# Patient Record
Sex: Male | Born: 1935 | Race: White | Hispanic: No | Marital: Married | Smoking: Former smoker
Health system: Southern US, Community
[De-identification: ages and names within clinical notes are randomized; demographics above are authoritative.]

## PROBLEM LIST (undated history)

## (undated) DIAGNOSIS — I2510 Atherosclerotic heart disease of native coronary artery without angina pectoris: Secondary | ICD-10-CM

## (undated) DIAGNOSIS — E119 Type 2 diabetes mellitus without complications: Secondary | ICD-10-CM

## (undated) DIAGNOSIS — H9190 Unspecified hearing loss, unspecified ear: Secondary | ICD-10-CM

## (undated) DIAGNOSIS — I10 Essential (primary) hypertension: Secondary | ICD-10-CM

## (undated) DIAGNOSIS — E785 Hyperlipidemia, unspecified: Secondary | ICD-10-CM

## (undated) DIAGNOSIS — H3530 Unspecified macular degeneration: Secondary | ICD-10-CM

## (undated) DIAGNOSIS — N289 Disorder of kidney and ureter, unspecified: Secondary | ICD-10-CM

## (undated) DIAGNOSIS — I219 Acute myocardial infarction, unspecified: Secondary | ICD-10-CM

## (undated) DIAGNOSIS — M1990 Unspecified osteoarthritis, unspecified site: Secondary | ICD-10-CM

## (undated) DIAGNOSIS — D689 Coagulation defect, unspecified: Secondary | ICD-10-CM

## (undated) DIAGNOSIS — I639 Cerebral infarction, unspecified: Secondary | ICD-10-CM

## (undated) HISTORY — DX: Type 2 diabetes mellitus without complications: E11.9

## (undated) HISTORY — PX: CORONARY ANGIOPLASTY WITH STENT PLACEMENT: SHX49

## (undated) HISTORY — DX: Disorder of kidney and ureter, unspecified: N28.9

## (undated) HISTORY — PX: THYROIDECTOMY: SHX17

## (undated) HISTORY — DX: Atherosclerotic heart disease of native coronary artery without angina pectoris: I25.10

## (undated) HISTORY — DX: Acute myocardial infarction, unspecified: I21.9

## (undated) HISTORY — DX: Coagulation defect, unspecified: D68.9

## (undated) HISTORY — DX: Unspecified osteoarthritis, unspecified site: M19.90

## (undated) HISTORY — DX: Essential (primary) hypertension: I10

## (undated) HISTORY — DX: Unspecified macular degeneration: H35.30

## (undated) HISTORY — DX: Cerebral infarction, unspecified: I63.9

## (undated) HISTORY — DX: Unspecified hearing loss, unspecified ear: H91.90

## (undated) HISTORY — DX: Hyperlipidemia, unspecified: E78.5

## (undated) HISTORY — PX: REPLACEMENT TOTAL KNEE: SUR1224

---

## 2019-08-08 LAB — PROTIME-INR: INR: 2.1 — AB (ref 0.9–1.1)

## 2019-09-24 LAB — COMPREHENSIVE METABOLIC PANEL
Calcium: 9.2 (ref 8.7–10.7)
GFR calc Af Amer: 96.4
GFR calc non Af Amer: 79.6

## 2019-09-24 LAB — BASIC METABOLIC PANEL
BUN: 17 (ref 4–21)
CO2: 30 — AB (ref 13–22)
Chloride: 110 — AB (ref 99–108)
Creatinine: 0.9 (ref 0.6–1.3)
Glucose: 110
Potassium: 4.6 (ref 3.4–5.3)
Sodium: 146 (ref 137–147)

## 2019-10-15 LAB — BASIC METABOLIC PANEL
BUN: 29 — AB (ref 4–21)
CO2: 28 — AB (ref 13–22)
Chloride: 108 (ref 99–108)
Creatinine: 1 (ref 0.6–1.3)
Glucose: 78
Potassium: 5.1 (ref 3.4–5.3)
Sodium: 141 (ref 137–147)

## 2019-10-15 LAB — COMPREHENSIVE METABOLIC PANEL
Calcium: 10 (ref 8.7–10.7)
GFR calc Af Amer: 85.3
GFR calc non Af Amer: 70.5

## 2019-10-25 LAB — BASIC METABOLIC PANEL
BUN: 35 — AB (ref 4–21)
CO2: 24 — AB (ref 13–22)
Chloride: 106 (ref 99–108)
Creatinine: 1.3 (ref 0.6–1.3)
Glucose: 150
Potassium: 5.7 — AB (ref 3.4–5.3)
Sodium: 138 (ref 137–147)

## 2019-10-25 LAB — COMPREHENSIVE METABOLIC PANEL
Calcium: 9.9 (ref 8.7–10.7)
GFR calc Af Amer: 63
GFR calc non Af Amer: 52.1

## 2019-10-25 LAB — TSH: TSH: 3.19 (ref 0.41–5.90)

## 2019-12-03 LAB — COMPREHENSIVE METABOLIC PANEL
Calcium: 9.8 (ref 8.7–10.7)
GFR calc Af Amer: 63
GFR calc non Af Amer: 52.1

## 2019-12-03 LAB — BASIC METABOLIC PANEL
BUN: 33 — AB (ref 4–21)
CO2: 26 — AB (ref 13–22)
Chloride: 109 — AB (ref 99–108)
Creatinine: 1.3 (ref 0.6–1.3)
Glucose: 84
Potassium: 5.5 — AB (ref 3.4–5.3)
Sodium: 142 (ref 137–147)

## 2019-12-03 LAB — HEMOGLOBIN A1C: Hemoglobin A1C: 7.7

## 2020-01-03 LAB — COMPREHENSIVE METABOLIC PANEL
Albumin: 3.3 — AB (ref 3.5–5.0)
Calcium: 8.9 (ref 8.7–10.7)
GFR calc Af Amer: 62
GFR calc non Af Amer: 53
Globulin: 4.2

## 2020-01-03 LAB — BASIC METABOLIC PANEL
BUN: 31 — AB (ref 4–21)
CO2: 24 — AB (ref 13–22)
Chloride: 111 — AB (ref 99–108)
Creatinine: 1.2 (ref 0.6–1.3)
Glucose: 109
Potassium: 4.6 (ref 3.4–5.3)
Sodium: 138 (ref 137–147)

## 2020-01-03 LAB — HEPATIC FUNCTION PANEL
ALT: 22 (ref 10–40)
AST: 18 (ref 14–40)
Alkaline Phosphatase: 60 (ref 25–125)
Bilirubin, Total: 0.2

## 2020-01-03 LAB — CBC: RBC: 3.5 — AB (ref 3.87–5.11)

## 2020-01-03 LAB — CBC AND DIFFERENTIAL
HCT: 34 — AB (ref 41–53)
Hemoglobin: 10.9 — AB (ref 13.5–17.5)
Platelets: 261 (ref 150–399)
WBC: 13.2

## 2020-02-27 ENCOUNTER — Ambulatory Visit: Payer: Self-pay | Admitting: Physician Assistant

## 2020-03-14 DIAGNOSIS — E109 Type 1 diabetes mellitus without complications: Secondary | ICD-10-CM | POA: Diagnosis not present

## 2020-03-14 DIAGNOSIS — H35033 Hypertensive retinopathy, bilateral: Secondary | ICD-10-CM | POA: Diagnosis not present

## 2020-03-14 DIAGNOSIS — H353231 Exudative age-related macular degeneration, bilateral, with active choroidal neovascularization: Secondary | ICD-10-CM | POA: Diagnosis not present

## 2020-03-14 DIAGNOSIS — H43813 Vitreous degeneration, bilateral: Secondary | ICD-10-CM | POA: Diagnosis not present

## 2020-03-31 ENCOUNTER — Telehealth: Payer: Self-pay

## 2020-03-31 NOTE — Telephone Encounter (Signed)
Pt.'s wife wants to know if pt can have his blood checked since he is on coumadin, before his new pt appointment.

## 2020-03-31 NOTE — Telephone Encounter (Signed)
Please advise 

## 2020-04-01 NOTE — Telephone Encounter (Signed)
Unfortunately we can't do labs on him before he is established.  Could check with an urgent care if needed or if his past provider can't do it.

## 2020-04-02 NOTE — Telephone Encounter (Signed)
LMOVM advising to return my call 

## 2020-04-02 NOTE — Telephone Encounter (Signed)
Pt returned call. Read pt Dr. Modesta Messing message. Pt understood.

## 2020-04-28 DIAGNOSIS — H353231 Exudative age-related macular degeneration, bilateral, with active choroidal neovascularization: Secondary | ICD-10-CM | POA: Diagnosis not present

## 2020-05-02 ENCOUNTER — Ambulatory Visit: Payer: Self-pay | Admitting: Family Medicine

## 2020-05-12 DIAGNOSIS — H353231 Exudative age-related macular degeneration, bilateral, with active choroidal neovascularization: Secondary | ICD-10-CM | POA: Diagnosis not present

## 2020-05-21 DIAGNOSIS — Z7901 Long term (current) use of anticoagulants: Secondary | ICD-10-CM | POA: Diagnosis not present

## 2020-05-21 DIAGNOSIS — I480 Paroxysmal atrial fibrillation: Secondary | ICD-10-CM | POA: Diagnosis not present

## 2020-06-16 ENCOUNTER — Ambulatory Visit: Payer: Self-pay | Admitting: Family Medicine

## 2020-07-16 ENCOUNTER — Ambulatory Visit: Payer: Self-pay | Admitting: Family Medicine

## 2020-07-17 ENCOUNTER — Other Ambulatory Visit: Payer: Self-pay

## 2020-07-17 ENCOUNTER — Ambulatory Visit (INDEPENDENT_AMBULATORY_CARE_PROVIDER_SITE_OTHER): Payer: Medicare Other | Admitting: Family Medicine

## 2020-07-17 ENCOUNTER — Encounter: Payer: Self-pay | Admitting: Family Medicine

## 2020-07-17 VITALS — BP 128/55 | HR 70 | Temp 98.6°F | Ht 68.5 in | Wt 169.8 lb

## 2020-07-17 DIAGNOSIS — E785 Hyperlipidemia, unspecified: Secondary | ICD-10-CM | POA: Diagnosis not present

## 2020-07-17 DIAGNOSIS — I8290 Acute embolism and thrombosis of unspecified vein: Secondary | ICD-10-CM

## 2020-07-17 DIAGNOSIS — I2583 Coronary atherosclerosis due to lipid rich plaque: Secondary | ICD-10-CM | POA: Diagnosis not present

## 2020-07-17 DIAGNOSIS — E1159 Type 2 diabetes mellitus with other circulatory complications: Secondary | ICD-10-CM

## 2020-07-17 DIAGNOSIS — I2510 Atherosclerotic heart disease of native coronary artery without angina pectoris: Secondary | ICD-10-CM | POA: Insufficient documentation

## 2020-07-17 DIAGNOSIS — R197 Diarrhea, unspecified: Secondary | ICD-10-CM

## 2020-07-17 DIAGNOSIS — E1169 Type 2 diabetes mellitus with other specified complication: Secondary | ICD-10-CM | POA: Diagnosis not present

## 2020-07-17 DIAGNOSIS — Z950 Presence of cardiac pacemaker: Secondary | ICD-10-CM

## 2020-07-17 DIAGNOSIS — E039 Hypothyroidism, unspecified: Secondary | ICD-10-CM

## 2020-07-17 DIAGNOSIS — E1165 Type 2 diabetes mellitus with hyperglycemia: Secondary | ICD-10-CM

## 2020-07-17 DIAGNOSIS — I10 Essential (primary) hypertension: Secondary | ICD-10-CM | POA: Insufficient documentation

## 2020-07-17 DIAGNOSIS — E559 Vitamin D deficiency, unspecified: Secondary | ICD-10-CM

## 2020-07-17 DIAGNOSIS — I152 Hypertension secondary to endocrine disorders: Secondary | ICD-10-CM | POA: Diagnosis not present

## 2020-07-17 DIAGNOSIS — M1990 Unspecified osteoarthritis, unspecified site: Secondary | ICD-10-CM

## 2020-07-17 DIAGNOSIS — G4700 Insomnia, unspecified: Secondary | ICD-10-CM

## 2020-07-17 DIAGNOSIS — N400 Enlarged prostate without lower urinary tract symptoms: Secondary | ICD-10-CM

## 2020-07-17 DIAGNOSIS — K635 Polyp of colon: Secondary | ICD-10-CM

## 2020-07-17 DIAGNOSIS — H3530 Unspecified macular degeneration: Secondary | ICD-10-CM

## 2020-07-17 HISTORY — DX: Hypothyroidism, unspecified: E03.9

## 2020-07-17 HISTORY — DX: Acute embolism and thrombosis of unspecified vein: I82.90

## 2020-07-17 LAB — CBC
HCT: 34.3 % — ABNORMAL LOW (ref 39.0–52.0)
Hemoglobin: 11.5 g/dL — ABNORMAL LOW (ref 13.0–17.0)
MCHC: 33.6 g/dL (ref 30.0–36.0)
MCV: 95.7 fl (ref 78.0–100.0)
Platelets: 242 10*3/uL (ref 150.0–400.0)
RBC: 3.58 Mil/uL — ABNORMAL LOW (ref 4.22–5.81)
RDW: 13.2 % (ref 11.5–15.5)
WBC: 10.2 10*3/uL (ref 4.0–10.5)

## 2020-07-17 LAB — HEMOGLOBIN A1C: Hgb A1c MFr Bld: 7.1 % — ABNORMAL HIGH (ref 4.6–6.5)

## 2020-07-17 LAB — COMPREHENSIVE METABOLIC PANEL
ALT: 14 U/L (ref 0–53)
AST: 17 U/L (ref 0–37)
Albumin: 4.1 g/dL (ref 3.5–5.2)
Alkaline Phosphatase: 52 U/L (ref 39–117)
BUN: 31 mg/dL — ABNORMAL HIGH (ref 6–23)
CO2: 25 mEq/L (ref 19–32)
Calcium: 9.7 mg/dL (ref 8.4–10.5)
Chloride: 107 mEq/L (ref 96–112)
Creatinine, Ser: 1.29 mg/dL (ref 0.40–1.50)
GFR: 49.19 mL/min — ABNORMAL LOW (ref >60.00)
Glucose, Bld: 133 mg/dL — ABNORMAL HIGH (ref 70–99)
Potassium: 5.7 mEq/L — ABNORMAL HIGH (ref 3.5–5.1)
Sodium: 137 mEq/L (ref 135–145)
Total Bilirubin: 0.3 mg/dL (ref 0.2–1.2)
Total Protein: 7.8 g/dL (ref 6.0–8.3)

## 2020-07-17 LAB — LIPID PANEL
Cholesterol: 156 mg/dL (ref 0–200)
HDL: 37.4 mg/dL — ABNORMAL LOW (ref >39.00)
LDL Cholesterol: 84 mg/dL (ref 0–99)
NonHDL: 118.4
Total CHOL/HDL Ratio: 4
Triglycerides: 172 mg/dL — ABNORMAL HIGH (ref 0.0–149.0)
VLDL: 34.4 mg/dL (ref 0.0–40.0)

## 2020-07-17 LAB — TSH: TSH: 2.91 u[IU]/mL (ref 0.35–4.50)

## 2020-07-17 LAB — VITAMIN D 25 HYDROXY (VIT D DEFICIENCY, FRACTURES): VITD: 46.27 ng/mL (ref 30.00–100.00)

## 2020-07-17 LAB — VITAMIN B12: Vitamin B-12: 477 pg/mL (ref 211–911)

## 2020-07-17 LAB — PROTIME-INR
INR: 3.3 ratio — ABNORMAL HIGH (ref 0.8–1.0)
Prothrombin Time: 36.5 s — ABNORMAL HIGH (ref 9.6–13.1)

## 2020-07-17 NOTE — Progress Notes (Signed)
Thomas [name] is a 87 y.o. male who presents today for an office visit. History is provided by patient and his son.   Assessment/Plan:  New/Acute Problems: Diarrhea Given associated weight loss and history, also place referral to GI.  Recommended use of Imodium as needed.  Chronic Problems Addressed Today: Colon polyps Will place referral to GI.  BPH (benign prostatic hyperplasia) Stable on Flomax 0.4 mg daily.  No red flags.  Insomnia Stable.  Continue Remeron 7.5 mg daily.  Dyslipidemia due to type 2 diabetes mellitus ([name]) Check lipids today.  Continue pravastatin 20 mg daily.  Macular degeneration Follows with ophthalmology.  Vitamin D deficiency Check Vitamin D level.   Type 2 diabetes mellitus with hyperglycemia ([name]) Has had a few lows recently.  Lowest recorded reading of 18.  Has occasionally had highs in the 200s when he forgets to take his insulin.  He is on lantus 13U daily. Will stop amaryl due to concern for hypoglycemia.  Check A1c today.  Follow-up in 3 months.  Osteoarthritis Overall stable.  Continue Tylenol as needed.  Cardiac pacemaker Will place referral to cardiology.  Hypertension associated with diabetes ([name]) At goal today though patient reports history of labile blood pressures.  Will sometimes increase into the 180s or above at home in the evenings.  We'll check labs today.  Continue current regimen of chlorthalidone 25 mg daily, clonidine 0.1 mg twice daily, pindolol 5 mg twice daily, ramipril 10 mg daily, and spironolactone 25 mg every other day.  He will follow-up with cardiology soon.  May consider trial of evening hydralazine if continues to have elevated blood pressure in the evening.  Venous thromboembolism (VTE) Check INR today.  He is on warfarin 4 mg daily.  Hypothyroidism Check TSH.  Continue Synthroid 100 mcg daily.  Coronary artery disease s/p CABG 2001 Will place referral to cardiology.     Subjective:  HPI:  Patient  is here with his son today to establish care.  Recently relocated to the area from [place] to be closer to family.  He requests referral to local cardiologist to establish care.  Has significant cardiac history.  Underwent CABG x6 in 2001.  Is also had numerous stents placed.  See A/P for status of chronic conditions  He has had ongoing diarrhea for the last few months.  He has lost some weight as well.  Has followed with GI in the past for this issue.  He is concerned about possible colon cancer.  He has had numerous colon polyps removed in the past.  No specific treatments tried for his current diarrhea issues.  PMH:  The following were reviewed and entered/updated in epic: Past Medical History:  Diagnosis Date  . Coronary artery disease   . Loss of hearing   . Macular degeneration   . Osteoarthritis    Patient Active Problem List   Diagnosis Date Noted  . Coronary artery disease s/p CABG 2001 07/17/2020  . Hypothyroidism 07/17/2020  . Venous thromboembolism (VTE) 07/17/2020  . Hypertension associated with diabetes ([name]) 07/17/2020  . Cardiac pacemaker 07/17/2020  . Osteoarthritis 07/17/2020  . Type 2 diabetes mellitus with hyperglycemia ([name]) 07/17/2020  . Vitamin D deficiency 07/17/2020  . Macular degeneration 07/17/2020  . Dyslipidemia due to type 2 diabetes mellitus ([name]) 07/17/2020  . Insomnia 07/17/2020  . BPH (benign prostatic hyperplasia) 07/17/2020  . Colon polyps 07/17/2020   Past Surgical History:  Procedure Laterality Date  . CORONARY ANGIOPLASTY WITH STENT PLACEMENT    .  CORONARY ARTERY BYPASS GRAFT  2001   x6  . REPLACEMENT TOTAL KNEE    . SPINE SURGERY  2008   cervical and lumbar  . THYROIDECTOMY      Family History  Problem Relation Age of Onset  . Stroke Mother   . Stroke Father   . Heart disease Sister   . Cancer Daughter   . Hypertension Son   . Heart disease Brother   . Cancer Brother   . Heart disease Brother     Medications- reviewed  and updated Current Outpatient Medications  Medication Sig Dispense Refill  . acetaminophen (TYLENOL) 325 MG tablet Take 650 mg by mouth every 6 (six) hours as needed.    Marland Kitchen aspirin 81 MG chewable tablet Chew 81 mg by mouth daily.    Marland Kitchen b complex vitamins capsule Take 1 capsule by mouth daily.    . chlorthalidone (HYGROTON) 25 MG tablet TAKE 1 TABLET BY MOUTH EVERY DAY AFTER BREAKFAST FOR BLOOD PRESSURE    . cholecalciferol (VITAMIN D3) 25 MCG (1000 UNIT) tablet Take 1,000 Units by mouth daily.    . cloNIDine (CATAPRES) 0.1 MG tablet Take 0.1 mg by mouth 2 (two) times daily.    Marland Kitchen co-enzyme Q-10 50 MG capsule Take 50 mg by mouth daily.    Marland Kitchen glucosamine-chondroitin 500-400 MG tablet Take 1 tablet by mouth 3 (three) times daily.    . insulin glargine (LANTUS) 100 unit/mL SOPN Inject 13 Units into the skin daily.    Marland Kitchen JANTOVEN 4 MG tablet Take 4 mg by mouth daily.    Marland Kitchen levothyroxine (SYNTHROID) 100 MCG tablet Take 100 mcg by mouth daily.    . mirtazapine (REMERON) 7.5 MG tablet Take 7.5 mg by mouth at bedtime.    . Multiple Vitamin (MULTIVITAMIN) tablet Take 1 tablet by mouth daily.    . pindolol (VISKEN) 5 MG tablet Take 5 mg by mouth 2 (two) times daily.    . pravastatin (PRAVACHOL) 20 MG tablet Take 20 mg by mouth at bedtime.    . ramipril (ALTACE) 10 MG capsule Take 10 mg by mouth daily.    . Red Yeast Rice Extract (RED YEAST RICE PO) Take by mouth.    . spironolactone (ALDACTONE) 25 MG tablet Take 25 mg by mouth every other day.    . tamsulosin (FLOMAX) 0.4 MG CAPS capsule SMARTSIG:1 Capsule(s) By Mouth Every Evening     No current facility-administered medications for this visit.    Allergies-reviewed and updated Allergies  Allergen Reactions  . Amitriptyline   . Effexor Xr [Venlafaxine]   . Lyrica [Pregabalin]   . Morphine And Related   . Pravachol [Pravastatin]   . Zolpidem     Social History   Socioeconomic History  . Marital status: Married    Spouse name: Not on file  .  Number of children: Not on file  . Years of education: Not on file  . Highest education level: Not on file  Occupational History  . Not on file  Tobacco Use  . Smoking status: Never Smoker  . Smokeless tobacco: Not on file  Substance and Sexual Activity  . Alcohol use: Never  . Drug use: Never  . Sexual activity: Not on file  Other Topics Concern  . Not on file  Social History Narrative  . Not on file   Social Determinants of Health   Financial Resource Strain: Not on file  Food Insecurity: Not on file  Transportation Needs: Not on file  Physical Activity: Not on file  Stress: Not on file  Social Connections: Not on file          Objective:  Physical Exam: BP (!) 128/55   Pulse 70   Temp 98.6 F (37 C) (Temporal)   Ht 5' 8.5" (1.74 m)   Wt 169 lb 12.8 oz (77 kg)   SpO2 98%   BMI 25.44 kg/m   Gen: No acute distress, resting comfortably CV: Regular rate and rhythm with no murmurs appreciated Pulm: Normal work of breathing, clear to auscultation bilaterally with no crackles, wheezes, or rhonchi Neuro: Grossly normal, moves all extremities Psych: Normal affect and thought content  Time Spent: 65 minutes of total time was spent on the date of the encounter performing the following actions: chart review prior to seeing the patient, obtaining history, performing a medically necessary exam, counseling on the treatment plan including labs and referrals, placing orders, and documenting in our EHR.        Katina Degree. [name], MD 07/17/2020 10:46 AM

## 2020-07-17 NOTE — Patient Instructions (Signed)
It was very nice to see you today!  Stop taking the Amaryl and cinnamon.  You can use over-the-counter Imodium as needed for diarrhea.  We'll check blood work today.  I'll place a referral for you to see a cardiologist and gastroenterologist.  I will see back in 3 months.  Please come back to see me sooner if needed.  Take care, Dr [name]  Please try these tips to maintain a healthy lifestyle:   Eat at least 3 REAL meals and 1-2 snacks per day.  Aim for no more than 5 hours between eating.  If you eat breakfast, please do so within one hour of getting up.    Each meal should contain half fruits/vegetables, one quarter protein, and one quarter carbs (no bigger than a computer mouse)   Cut down on sweet beverages. This includes juice, soda, and sweet tea.     Drink at least 1 glass of water with each meal and aim for at least 8 glasses per day   Exercise at least 150 minutes every week.

## 2020-07-17 NOTE — Assessment & Plan Note (Signed)
Check lipids today.  Continue pravastatin 20 mg daily.

## 2020-07-17 NOTE — Assessment & Plan Note (Signed)
Will place referral to GI. 

## 2020-07-17 NOTE — Assessment & Plan Note (Signed)
Follows with ophthalmology

## 2020-07-17 NOTE — Assessment & Plan Note (Signed)
Overall stable.  Continue Tylenol as needed.

## 2020-07-17 NOTE — Assessment & Plan Note (Signed)
Will place referral to cardiology. 

## 2020-07-17 NOTE — Assessment & Plan Note (Signed)
At goal today though patient reports history of labile blood pressures.  Will sometimes increase into the 180s or above at home in the evenings.  We'll check labs today.  Continue current regimen of chlorthalidone 25 mg daily, clonidine 0.1 mg twice daily, pindolol 5 mg twice daily, ramipril 10 mg daily, and spironolactone 25 mg every other day.  He will follow-up with cardiology soon.  May consider trial of evening hydralazine if continues to have elevated blood pressure in the evening.

## 2020-07-17 NOTE — Assessment & Plan Note (Signed)
Has had a few lows recently.  Lowest recorded reading of 18.  Has occasionally had highs in the 200s when he forgets to take his insulin.  He is on lantus 13U daily. Will stop amaryl due to concern for hypoglycemia.  Check A1c today.  Follow-up in 3 months.

## 2020-07-17 NOTE — Assessment & Plan Note (Signed)
Check INR today.  He is on warfarin 4 mg daily.

## 2020-07-17 NOTE — Assessment & Plan Note (Signed)
Check TSH.  Continue Synthroid 100 mcg daily. 

## 2020-07-17 NOTE — Assessment & Plan Note (Signed)
Stable on Flomax 0.4 mg daily.  No red flags.

## 2020-07-17 NOTE — Assessment & Plan Note (Signed)
Stable.  Continue Remeron 7.5 mg daily.

## 2020-07-17 NOTE — Assessment & Plan Note (Signed)
Check Vitamin D level 

## 2020-07-18 NOTE — Progress Notes (Signed)
Please inform patient of the following:  His potassium is high. Would like for him to STOP his spironolactone and come back to recheck BMET in 1 week.   His INR was too high. We need to adjust his dose of warfarin. Recommend that he continue 4mg  on Monday, Wednesday, Friday, and Sunday. Will need to send in a 3mg  dose to take on Tuesday, Thursday, and Saturday. This will need to be rechecked in a month - cardiology should be able to do this. Please send in 3mg  tablet.   Cholesterol and blood sugar were both within acceptable ranges.   Blood counts were a bit low. Do not have anything prior to compare with. We should recheck CBC when he comes back in a week.  Monday. Sunday, MD 07/18/2020 9:53 AM

## 2020-07-21 ENCOUNTER — Other Ambulatory Visit: Payer: Self-pay

## 2020-07-21 ENCOUNTER — Encounter: Payer: Self-pay | Admitting: Cardiology

## 2020-07-21 ENCOUNTER — Ambulatory Visit (INDEPENDENT_AMBULATORY_CARE_PROVIDER_SITE_OTHER): Payer: Medicare Other | Admitting: Cardiology

## 2020-07-21 VITALS — BP 126/76 | HR 73 | Ht 68.5 in | Wt 170.4 lb

## 2020-07-21 DIAGNOSIS — I25119 Atherosclerotic heart disease of native coronary artery with unspecified angina pectoris: Secondary | ICD-10-CM | POA: Diagnosis not present

## 2020-07-21 DIAGNOSIS — I152 Hypertension secondary to endocrine disorders: Secondary | ICD-10-CM

## 2020-07-21 DIAGNOSIS — I2782 Chronic pulmonary embolism: Secondary | ICD-10-CM

## 2020-07-21 DIAGNOSIS — I2510 Atherosclerotic heart disease of native coronary artery without angina pectoris: Secondary | ICD-10-CM

## 2020-07-21 DIAGNOSIS — Z79899 Other long term (current) drug therapy: Secondary | ICD-10-CM | POA: Diagnosis not present

## 2020-07-21 DIAGNOSIS — I2583 Coronary atherosclerosis due to lipid rich plaque: Secondary | ICD-10-CM

## 2020-07-21 DIAGNOSIS — E1165 Type 2 diabetes mellitus with hyperglycemia: Secondary | ICD-10-CM

## 2020-07-21 DIAGNOSIS — E7800 Pure hypercholesterolemia, unspecified: Secondary | ICD-10-CM

## 2020-07-21 DIAGNOSIS — I639 Cerebral infarction, unspecified: Secondary | ICD-10-CM

## 2020-07-21 DIAGNOSIS — E1159 Type 2 diabetes mellitus with other circulatory complications: Secondary | ICD-10-CM

## 2020-07-21 DIAGNOSIS — Z950 Presence of cardiac pacemaker: Secondary | ICD-10-CM

## 2020-07-21 MED ORDER — ROSUVASTATIN CALCIUM 20 MG PO TABS: 20.0000 mg | ORAL_TABLET | Freq: Every day | ORAL | 0 refills | Status: DC

## 2020-07-21 NOTE — Progress Notes (Signed)
Cardiology Office Note:    Date:  07/21/2020   ID:  Thomas RevelAndrew Hudnall, DOB May 24, 1929, MRN [id number]  PCP:  Ardith DarkParker, Caleb M, MD  [facility] Cardiologist:  No primary care provider on file.  [facility] Electrophysiologist:  None   Referring MD: Ardith DarkParker, Caleb M, MD   History of Present Illness:    Thomas [name] is a 87 y.o. male with a hx of HLD, DMII, prior CVA, HTN, CAD s/p CABG in 2001, SSS s/p PPM, pulmonary embolism on warfarin and hypothyroidism who was referred by Dr. Jimmey RalphParker for further management of his CAD.  Patient states that he has a history of MI at age 87 that was managed medically at that time. He had subsequent PCIs and MI in 2001 leading to bypass surgery. He states he has had no chest pain or shortness of breath since his surgery. No stents placed after bypass surgery. Blood pressures fluctuate significantly at home from 90/50s to 200s/100s. He is currently on chlorthalidone, clonidine, ramipril, and possibly spironolactone. He can feel very fatigued but he thinks it may be related to high blood glucoses as has not correlated with hypotension.  On warfarin for history of pulmonary embolism. Last INR 3.3. Warfarin was being adjusted by PCP. No history of bleeding. Has a couple of falls but no head-strike. Continues to be on aspirin and was told never to stop this by his prior Cardiologist  Due for pacemaker check in April. Has had no issues with his pacemaker since placement. States it was placed for recurrent episodes of fainting.  Past Medical History:  Diagnosis Date  . Arthritis   . Clotting disorder ([name])   . Coronary artery disease   . Diabetes ([name])   . Hyperlipidemia   . Hypertension   . Kidney disease   . Loss of hearing   . Macular degeneration   . Myocardial infarction ([name])   . Osteoarthritis   . Stroke [facility]([name])     Past Surgical History:  Procedure Laterality Date  . CARPAL TUNNEL RELEASE  2011  . CORONARY ANGIOPLASTY WITH STENT PLACEMENT     . CORONARY ARTERY BYPASS GRAFT  2001   x6  . REPLACEMENT TOTAL KNEE    . SPINE SURGERY  2008   cervical and lumbar  . THYROIDECTOMY      Current Medications: Current Meds  Medication Sig  . acetaminophen (TYLENOL) 325 MG tablet Take 650 mg by mouth every 6 (six) hours as needed.  Marland Kitchen. aspirin 81 MG chewable tablet Chew 81 mg by mouth daily.  Marland Kitchen. b complex vitamins capsule Take 1 capsule by mouth daily.  . chlorthalidone (HYGROTON) 25 MG tablet TAKE 1 TABLET BY MOUTH EVERY DAY AFTER BREAKFAST FOR BLOOD PRESSURE  . cholecalciferol (VITAMIN D3) 25 MCG (1000 UNIT) tablet Take 1,000 Units by mouth daily.  . cloNIDine (CATAPRES) 0.1 MG tablet Take 0.1 mg by mouth 2 (two) times daily.  Marland Kitchen. co-enzyme Q-10 50 MG capsule Take 50 mg by mouth daily.  Marland Kitchen. glucosamine-chondroitin 500-400 MG tablet Take 1 tablet by mouth 3 (three) times daily.  . insulin glargine (LANTUS) 100 unit/mL SOPN Inject 13 Units into the skin daily.  Marland Kitchen. JANTOVEN 4 MG tablet Take 4 mg by mouth daily.  Marland Kitchen. levothyroxine (SYNTHROID) 100 MCG tablet Take 100 mcg by mouth daily.  . mirtazapine (REMERON) 7.5 MG tablet Take 7.5 mg by mouth at bedtime.  . Multiple Vitamin (MULTIVITAMIN) tablet Take 1 tablet by mouth daily.  . pindolol (VISKEN) 5 MG tablet  Take 5 mg by mouth 2 (two) times daily.  . ramipril (ALTACE) 10 MG capsule Take 10 mg by mouth daily.  . Red Yeast Rice Extract (RED YEAST RICE PO) Take by mouth.  . rosuvastatin (CRESTOR) 20 MG tablet Take 1 tablet (20 mg total) by mouth daily.  . tamsulosin (FLOMAX) 0.4 MG CAPS capsule SMARTSIG:1 Capsule(s) By Mouth Every Evening  . [DISCONTINUED] pravastatin (PRAVACHOL) 20 MG tablet Take 20 mg by mouth at bedtime.     Allergies:   Amitriptyline, Effexor xr [venlafaxine], Lyrica [pregabalin], Morphine and related, Pravachol [pravastatin], and Zolpidem   Social History   Socioeconomic History  . Marital status: Married    Spouse name: Not on file  . Number of children: Not on file   . Years of education: Not on file  . Highest education level: Not on file  Occupational History  . Not on file  Tobacco Use  . Smoking status: Never Smoker  . Smokeless tobacco: Never Used  Substance and Sexual Activity  . Alcohol use: Never  . Drug use: Never  . Sexual activity: Not on file  Other Topics Concern  . Not on file  Social History Narrative  . Not on file   Social Determinants of Health   Financial Resource Strain: Not on file  Food Insecurity: Not on file  Transportation Needs: Not on file  Physical Activity: Not on file  Stress: Not on file  Social Connections: Not on file     Family History: The patient's family history includes Cancer in his brother and daughter; Heart disease in his brother, brother, and sister; Hypertension in his son; Stroke in his father and mother.  ROS:   Please see the history of present illness.    Review of Systems  Constitutional: Negative for chills and fever.  HENT: Positive for hearing loss.   Eyes: Negative for pain.  Respiratory: Negative for shortness of breath.   Cardiovascular: Negative for chest pain, palpitations, orthopnea, claudication, leg swelling and PND.  Gastrointestinal: Negative for nausea and vomiting.  Genitourinary: Negative for hematuria.  Musculoskeletal: Positive for falls and joint pain.  Neurological: Negative for dizziness and loss of consciousness.  Endo/Heme/Allergies: Negative for polydipsia.  Psychiatric/Behavioral: Negative for substance abuse.    EKGs/Labs/Other Studies Reviewed:    The following studies were reviewed today: No prior studies in our system  EKG:  EKG is  ordered today.  The ekg ordered today demonstrates A-paced with PVC  Recent Labs: 07/17/2020: ALT 14; BUN 31; Creatinine, Ser 1.29; Hemoglobin 11.5; Platelets 242.0; Potassium 5.7; Sodium 137; TSH 2.91  Recent Lipid Panel    Component Value Date/Time   CHOL 156 07/17/2020 1042   TRIG 172.0 (H) 07/17/2020 1042   HDL  37.40 (L) 07/17/2020 3818   CHOLHDL 4 07/17/2020 1042   VLDL 34.4 07/17/2020 1042   LDLCALC 84 07/17/2020 1042     Physical Exam:    VS:  BP 126/76   Pulse 73   Ht 5' 8.5" (1.74 m)   Wt 170 lb 6.4 oz (77.3 kg)   SpO2 97%   BMI 25.53 kg/m     Wt Readings from Last 3 Encounters:  07/21/20 170 lb 6.4 oz (77.3 kg)  07/17/20 169 lb 12.8 oz (77 kg)     GEN: Elderly male, hard of hearing, NAD HEENT: Normal NECK: No JVD; No carotid bruits CARDIAC: RRR, no murmurs, rubs, gallops RESPIRATORY:  Clear to auscultation without rales, wheezing or rhonchi  ABDOMEN: Soft, non-tender, non-distended  MUSCULOSKELETAL:  No edema; No deformity  SKIN: Warm and dry NEUROLOGIC:  Alert and oriented x 3 PSYCHIATRIC:  Normal affect   ASSESSMENT:    1. Coronary artery disease involving native heart with angina pectoris, unspecified vessel or lesion type ([name])   2. Medication management   3. Coronary artery disease due to lipid rich plaque   4. Hypertension associated with diabetes ([name])   5. Cardiac pacemaker   6. Type 2 diabetes mellitus with hyperglycemia, unspecified whether long term insulin use ([name])   7. Pure hypercholesterolemia   8. Cerebrovascular accident (CVA), unspecified mechanism ([name])   9. Chronic pulmonary embolism, unspecified pulmonary embolism type, unspecified whether acute cor pulmonale present [facility])    PLAN:    In order of problems listed above:  #Multivessel CAD s/p CABG in 2001: Patient with history of multiple MI's in the past with initial occurring at age 52. Had several subsequent PCIs (thinks he has 10 stents) and then suffered MI in 2001 prompting bypass surgery. Has been doing well post-bypass with no further chest pain or SOB. He has not required PCI since his bypass surgery. Has been followed by Cardiology in [place], [place] prior to transferring care here.  -Check TTE to assess LVEF -Continue ASA 81mg  daily as patient was told to never stop from prior  cardiologist and is not having bleeding issues; may want to readdress in the future as ASA and warfarin pose high risk of bleed in this elderly gentleman. He understands this and would like to continue ASA at this time -Continue rampipril for now; last K 5.7 so will need repeat BMET in 1-2 weeks -Hold spironolactone if he is taking it given elevated K -Change prava to crestor -Will add beta blocker at next visit or with HTN clinic -Change prava to crestor 20mg  daily  #HTN: Patient is on a number of anti-hypertensives that I am concerned about in an elderly male with history of falls. Has been having blood pressure swings at home. Goal is to titrate off clonidine and possibly chlorthalidone and maintain on BB, ACE/ARB +/- spiro given history of CAD. Would prefer amlo over chlorthalidone. Will do this with the assistance of HTN clinic. -Continue chlorthalidone for now--favor changing to amlodipine going forward -Goal to wean off clonidine--will attempt with HTN clinic -Start BB if able at next visit -Continue ramipril and repeat BMET in 2 weeks -Stop if taking as K 5.7 -Appreciate assistance of HTN clinic as ultimately our goal is to try to optimize blood pressures, prevent blood pressure swings and minimize risk to this elderly male with history of falls  #Suspected SSS s/p PPM placement: -Establish care with EP for further management of PPM  #HLD: -Change prava to crestor as above  #DMII on insulin: -Management per PCP  #History of PE on Warfarin: -Continue warfarin -Managed by PCP  #Prior CVA: -Change pravastatin to crestor 20mg  daily -Continue warfarin -Continue ASA for now as above  Will see back in 1 month for continued medication adjustment     Medication Adjustments/Labs and Tests Ordered: Current medicines are reviewed at length with the patient today.  Concerns regarding medicines are outlined above.  Orders Placed This Encounter  Procedures  . Basic metabolic  panel  . Ambulatory referral to Cardiac Electrophysiology  . AMB Referral to [facility] Pharm-D  . EKG 12-Lead  . ECHOCARDIOGRAM COMPLETE   Meds ordered this encounter  Medications  . rosuvastatin (CRESTOR) 20 MG tablet    Sig: Take 1 tablet (20  mg total) by mouth daily.    Dispense:  90 tablet    Refill:  0    Patient Instructions  Medication Instructions:   STOP TAKING PRAVASTATIN NOW  STOP TAKING SPIRONOLACTONE (ALDACTONE) NOW  START TAKING ROSUVASTATIN (CRESTOR) 20 MG BY MOUTH DAILY  *If you need a refill on your cardiac medications before your next appointment, please call your pharmacy*   Lab Work:  IN 2 WEEKS HERE AT THE OFFICE--WILL CHECK A BMET AT THAT TIME.  If you have labs (blood work) drawn today and your tests are completely normal, you will receive your results only by: Marland Kitchen MyChart Message (if you have MyChart) OR . A paper copy in the mail If you have any lab test that is abnormal or we need to change your treatment, we will call you to review the results.   You have been referred to SEE OUR ELECTROPHYSIOLOGY PHYSICIANS HERE IN OUR OFFICE TO ESTABLISH CARE AND TO FOLLOW YOUR PACEMAKER  You have been referred to  [facility] HERE IN THE OFFICE, TO SEE OUR PHARMACIST, FOR MEDICATION MANAGEMENT   Testing/Procedures:  Your physician has requested that you have an echocardiogram. Echocardiography is a painless test that uses sound waves to create images of your heart. It provides your doctor with information about the size and shape of your heart and how well your heart's chambers and valves are working. This procedure takes approximately one hour. There are no restrictions for this procedure.    Follow-Up:  ONE MONTH WITH DR. Shari Prows IN THE OFFICE     Signed, Meriam Sprague, MD  07/21/2020 2:32 PM    [facility]

## 2020-07-21 NOTE — Patient Instructions (Signed)
Medication Instructions:   STOP TAKING PRAVASTATIN NOW  STOP TAKING SPIRONOLACTONE (ALDACTONE) NOW  START TAKING ROSUVASTATIN (CRESTOR) 20 MG BY MOUTH DAILY  *If you need a refill on your cardiac medications before your next appointment, please call your pharmacy*   Lab Work:  IN 2 WEEKS HERE AT THE OFFICE--WILL CHECK A BMET AT THAT TIME.  If you have labs (blood work) drawn today and your tests are completely normal, you will receive your results only by: Marland Kitchen MyChart Message (if you have MyChart) OR . A paper copy in the mail If you have any lab test that is abnormal or we need to change your treatment, we will call you to review the results.   You have been referred to SEE OUR ELECTROPHYSIOLOGY PHYSICIANS HERE IN OUR OFFICE TO ESTABLISH CARE AND TO FOLLOW YOUR PACEMAKER  You have been referred to  HYPERTENSION CLINIC HERE IN THE OFFICE, TO SEE OUR PHARMACIST, FOR MEDICATION MANAGEMENT   Testing/Procedures:  Your physician has requested that you have an echocardiogram. Echocardiography is a painless test that uses sound waves to create images of your heart. It provides your doctor with information about the size and shape of your heart and how well your heart's chambers and valves are working. This procedure takes approximately one hour. There are no restrictions for this procedure.    Follow-Up:  ONE MONTH WITH DR. Shari Prows IN THE OFFICE

## 2020-07-23 ENCOUNTER — Other Ambulatory Visit: Payer: Self-pay | Admitting: Family Medicine

## 2020-07-23 ENCOUNTER — Other Ambulatory Visit: Payer: Self-pay | Admitting: *Deleted

## 2020-07-23 DIAGNOSIS — R899 Unspecified abnormal finding in specimens from other organs, systems and tissues: Secondary | ICD-10-CM

## 2020-07-23 MED ORDER — WARFARIN SODIUM 3 MG PO TABS: 3.0000 mg | ORAL_TABLET | Freq: Every day | ORAL | 0 refills | Status: DC

## 2020-07-28 DIAGNOSIS — H353231 Exudative age-related macular degeneration, bilateral, with active choroidal neovascularization: Secondary | ICD-10-CM | POA: Diagnosis not present

## 2020-07-29 ENCOUNTER — Other Ambulatory Visit: Payer: Self-pay

## 2020-07-29 ENCOUNTER — Other Ambulatory Visit (INDEPENDENT_AMBULATORY_CARE_PROVIDER_SITE_OTHER): Payer: Medicare Other

## 2020-07-29 ENCOUNTER — Encounter: Payer: Self-pay | Admitting: Physician Assistant

## 2020-07-29 ENCOUNTER — Ambulatory Visit: Payer: Self-pay | Admitting: Family Medicine

## 2020-07-29 DIAGNOSIS — R899 Unspecified abnormal finding in specimens from other organs, systems and tissues: Secondary | ICD-10-CM | POA: Diagnosis not present

## 2020-07-29 LAB — BASIC METABOLIC PANEL
BUN: 35 mg/dL — ABNORMAL HIGH (ref 6–23)
CO2: 23 mEq/L (ref 19–32)
Calcium: 9.2 mg/dL (ref 8.4–10.5)
Chloride: 106 mEq/L (ref 96–112)
Creatinine, Ser: 1.29 mg/dL (ref 0.40–1.50)
GFR: 49.18 mL/min — ABNORMAL LOW (ref >60.00)
Glucose, Bld: 171 mg/dL — ABNORMAL HIGH (ref 70–99)
Potassium: 4.6 mEq/L (ref 3.5–5.1)
Sodium: 135 mEq/L (ref 135–145)

## 2020-07-29 LAB — CBC
HCT: 34.2 % — ABNORMAL LOW (ref 39.0–52.0)
Hemoglobin: 11.4 g/dL — ABNORMAL LOW (ref 13.0–17.0)
MCHC: 33.4 g/dL (ref 30.0–36.0)
MCV: 96.7 fl (ref 78.0–100.0)
Platelets: 213 10*3/uL (ref 150.0–400.0)
RBC: 3.54 Mil/uL — ABNORMAL LOW (ref 4.22–5.81)
RDW: 13.2 % (ref 11.5–15.5)
WBC: 11.2 10*3/uL — ABNORMAL HIGH (ref 4.0–10.5)

## 2020-07-31 ENCOUNTER — Other Ambulatory Visit: Payer: Self-pay

## 2020-07-31 ENCOUNTER — Ambulatory Visit: Payer: Medicare Other | Admitting: Internal Medicine

## 2020-07-31 ENCOUNTER — Encounter: Payer: Self-pay | Admitting: Internal Medicine

## 2020-07-31 ENCOUNTER — Telehealth: Payer: Self-pay

## 2020-07-31 VITALS — BP 104/62 | HR 74 | Ht 68.5 in | Wt 168.4 lb

## 2020-07-31 DIAGNOSIS — Z950 Presence of cardiac pacemaker: Secondary | ICD-10-CM | POA: Diagnosis not present

## 2020-07-31 DIAGNOSIS — I2510 Atherosclerotic heart disease of native coronary artery without angina pectoris: Secondary | ICD-10-CM | POA: Diagnosis not present

## 2020-07-31 DIAGNOSIS — I480 Paroxysmal atrial fibrillation: Secondary | ICD-10-CM

## 2020-07-31 DIAGNOSIS — I495 Sick sinus syndrome: Secondary | ICD-10-CM | POA: Diagnosis not present

## 2020-07-31 LAB — CUP PACEART INCLINIC DEVICE CHECK
Battery Remaining Longevity: 149 mo
Battery Voltage: 3.03 V
Brady Statistic AP VP Percent: 0.03 %
Brady Statistic AP VS Percent: 92.95 %
Brady Statistic AS VP Percent: 0 %
Brady Statistic AS VS Percent: 7.02 %
Brady Statistic RA Percent Paced: 93.27 %
Brady Statistic RV Percent Paced: 0.04 %
Date Time Interrogation Session: 20220203104648
Implantable Lead Implant Date: 20081106
Implantable Lead Implant Date: 20210210
Implantable Lead Location: 753859
Implantable Lead Location: 753860
Implantable Lead Model: 4076
Implantable Lead Model: 4076
Implantable Pulse Generator Implant Date: 20210210
Lead Channel Impedance Value: 304 Ohm
Lead Channel Impedance Value: 380 Ohm
Lead Channel Impedance Value: 418 Ohm
Lead Channel Impedance Value: 589 Ohm
Lead Channel Pacing Threshold Amplitude: 0.5 V
Lead Channel Pacing Threshold Amplitude: 0.75 V
Lead Channel Pacing Threshold Pulse Width: 0.4 ms
Lead Channel Pacing Threshold Pulse Width: 0.4 ms
Lead Channel Sensing Intrinsic Amplitude: 1.125 mV
Lead Channel Sensing Intrinsic Amplitude: 1.5 mV
Lead Channel Sensing Intrinsic Amplitude: 12.5 mV
Lead Channel Sensing Intrinsic Amplitude: 13.875 mV
Lead Channel Setting Pacing Amplitude: 1.5 V
Lead Channel Setting Pacing Amplitude: 2 V
Lead Channel Setting Pacing Pulse Width: 0.4 ms
Lead Channel Setting Sensing Sensitivity: 0.9 mV

## 2020-07-31 NOTE — Telephone Encounter (Signed)
Patient has relocated from [place].  He has a Medtronic PPM, with relay monitor monitor through [facility].  Transfer request submitted in [facility].  Attempted to reach previous clinic to request they process transfer.  Unable to speak with live person, left message on nurse line requesting transfer or callback.

## 2020-07-31 NOTE — Patient Instructions (Addendum)
Medication Instructions:  Your physician recommends that you continue on your current medications as directed. Please refer to the Current Medication list given to you today.  Labwork: None ordered.  Testing/Procedures: None ordered.  Follow-Up:  Your physician wants you to follow-up in: one year with Lewayne Bunting, MD or one of the following Advanced Practice Providers on your designated Care Team:    Gypsy Balsam, NP  Francis Dowse, PA-C  Casimiro Needle "Mardelle Matte" [place], [place]  Remote monitoring is used to monitor your Pacemaker from home. This monitoring reduces the number of office visits required to check your device to one time per year. It allows [place] to keep an eye on the functioning of your device to ensure it is working properly. You are scheduled for a device check from home on Oct 30, 2020. You may send your transmission at any time that day. If you have a wireless device, the transmission will be sent automatically. After your physician reviews your transmission, you will receive a postcard with your next transmission date.    Any Other Special Instructions Will Be Listed Below (If Applicable).  If you need a refill on your cardiac medications before your next appointment, please call your pharmacy.

## 2020-07-31 NOTE — Progress Notes (Signed)
HPI Mr. Kalas is referred by Dr. Shari Prows for ongoing evaluation and management of his PPM. The patient has a h/o CAD, s/p MI almost 50 years ago, s/p multiple PCI's and a CABG in 2001. He has a h/o pulmonary embolism remotely and has been on coumadin. He has undergone PM gen change out last year. He has moved to [place] to be closer to his son and daughter in law. He has become more sedentary but his son tries to get he and his wife out when the weather is good.  Allergies  Allergen Reactions  . Amitriptyline   . Effexor Xr [Venlafaxine]   . Lyrica [Pregabalin]   . Morphine And Related   . Pravachol [Pravastatin]   . Zolpidem      Current Outpatient Medications  Medication Sig Dispense Refill  . acetaminophen (TYLENOL) 325 MG tablet Take 650 mg by mouth every 6 (six) hours as needed.    Marland Kitchen aspirin 81 MG chewable tablet Chew 81 mg by mouth daily.    Marland Kitchen b complex vitamins capsule Take 1 capsule by mouth daily.    . chlorthalidone (HYGROTON) 25 MG tablet TAKE 1 TABLET BY MOUTH EVERY DAY AFTER BREAKFAST FOR BLOOD PRESSURE    . cholecalciferol (VITAMIN D3) 25 MCG (1000 UNIT) tablet Take 1,000 Units by mouth daily.    . cloNIDine (CATAPRES) 0.1 MG tablet Take 0.1 mg by mouth 2 (two) times daily.    Marland Kitchen co-enzyme Q-10 50 MG capsule Take 50 mg by mouth daily.    Marland Kitchen glucosamine-chondroitin 500-400 MG tablet Take 1 tablet by mouth 3 (three) times daily.    . insulin glargine (LANTUS) 100 unit/mL SOPN Inject 13 Units into the skin daily.    Marland Kitchen JANTOVEN 4 MG tablet Take 4 mg by mouth daily.    Marland Kitchen levothyroxine (SYNTHROID) 100 MCG tablet Take 100 mcg by mouth daily.    . mirtazapine (REMERON) 7.5 MG tablet Take 7.5 mg by mouth at bedtime.    . Multiple Vitamin (MULTIVITAMIN) tablet Take 1 tablet by mouth daily.    . pindolol (VISKEN) 5 MG tablet Take 5 mg by mouth 2 (two) times daily.    . ramipril (ALTACE) 10 MG capsule Take 10 mg by mouth daily.    . Red Yeast Rice Extract (RED YEAST RICE PO)  Take by mouth.    . rosuvastatin (CRESTOR) 20 MG tablet Take 1 tablet (20 mg total) by mouth daily. 90 tablet 0  . tamsulosin (FLOMAX) 0.4 MG CAPS capsule SMARTSIG:1 Capsule(s) By Mouth Every Evening    . warfarin (COUMADIN) 3 MG tablet TAKE 1 TABLET BY MOUTH DAILY ON TUESDAY, THURSDAY AND SATURDAY 38 tablet 0   No current facility-administered medications for this visit.     Past Medical History:  Diagnosis Date  . Arthritis   . Clotting disorder ([name])   . Coronary artery disease   . Diabetes ([name])   . Hyperlipidemia   . Hypertension   . Kidney disease   . Loss of hearing   . Macular degeneration   . Myocardial infarction ([name])   . Osteoarthritis   . Stroke ([name])     ROS:   All systems reviewed and negative except as noted in the HPI.   Past Surgical History:  Procedure Laterality Date  . CARPAL TUNNEL RELEASE  2011  . CORONARY ANGIOPLASTY WITH STENT PLACEMENT    . CORONARY ARTERY BYPASS GRAFT  2001   x6  . REPLACEMENT TOTAL KNEE    .  SPINE SURGERY  2008   cervical and lumbar  . THYROIDECTOMY       Family History  Problem Relation Age of Onset  . Stroke Mother   . Stroke Father   . Heart disease Sister   . Cancer Daughter   . Hypertension Son   . Heart disease Brother   . Cancer Brother   . Heart disease Brother      Social History   Socioeconomic History  . Marital status: Married    Spouse name: Not on file  . Number of children: Not on file  . Years of education: Not on file  . Highest education level: Not on file  Occupational History  . Not on file  Tobacco Use  . Smoking status: Never Smoker  . Smokeless tobacco: Never Used  Substance and Sexual Activity  . Alcohol use: Never  . Drug use: Never  . Sexual activity: Not on file  Other Topics Concern  . Not on file  Social History Narrative  . Not on file   Social Determinants of Health   Financial Resource Strain: Not on file  Food Insecurity: Not on file  Transportation Needs:  Not on file  Physical Activity: Not on file  Stress: Not on file  Social Connections: Not on file  Intimate Partner Violence: Not on file     BP 104/62   Pulse 74   Ht 5' 8.5" (1.74 m)   Wt 168 lb 6.4 oz (76.4 kg)   SpO2 92%   BMI 25.23 kg/m   Physical Exam:  Well appearing elderly man, NAD HEENT: Unremarkable Neck:  No JVD, no thyromegally Lymphatics:  No adenopathy Back:  No CVA tenderness Lungs:  Clear with no wheezes HEART:  Regular rate rhythm, no murmurs, no rubs, no clicks Abd:  soft, positive bowel sounds, no organomegally, no rebound, no guarding Ext:  2 plus pulses, no edema, no cyanosis, no clubbing Skin:  No rashes no nodules Neuro:  CN II through XII intact, motor grossly intact  DEVICE  Normal device function.  See PaceArt for details.   Assess/Plan: 1. Sinus node dysfunction - he is asymptomatic, s/p PPM insertion. 2. Syncope - this has resolved since his PPM was placed. 3. CAD - he is sedentary but denies anginal symptoms. 4. PPM -his medtronic DDD PM is working normally.  5. PAF - he has had no atrial fib since his last check. He will continue warfarin.  Sharlot Gowda Harrell Niehoff,MD

## 2020-07-31 NOTE — Telephone Encounter (Signed)
Patient transfer in [facility] confirmed.  Record updated.

## 2020-07-31 NOTE — Progress Notes (Signed)
Please inform patient of the following:  Potassium is back to normal and other labs are stable. Would like for him to continue medications as he is currently taking them.  Katina Degree. [name], MD 07/31/2020 3:53 PM

## 2020-08-07 ENCOUNTER — Other Ambulatory Visit: Payer: Self-pay

## 2020-08-07 ENCOUNTER — Ambulatory Visit (HOSPITAL_COMMUNITY): Payer: Medicare Other | Attending: Cardiology

## 2020-08-07 ENCOUNTER — Ambulatory Visit (INDEPENDENT_AMBULATORY_CARE_PROVIDER_SITE_OTHER): Payer: Medicare Other | Admitting: Pharmacist

## 2020-08-07 ENCOUNTER — Other Ambulatory Visit: Payer: Medicare Other

## 2020-08-07 VITALS — BP 124/64 | HR 76

## 2020-08-07 DIAGNOSIS — I25119 Atherosclerotic heart disease of native coronary artery with unspecified angina pectoris: Secondary | ICD-10-CM

## 2020-08-07 DIAGNOSIS — I10 Essential (primary) hypertension: Secondary | ICD-10-CM | POA: Diagnosis not present

## 2020-08-07 DIAGNOSIS — E1159 Type 2 diabetes mellitus with other circulatory complications: Secondary | ICD-10-CM

## 2020-08-07 DIAGNOSIS — Z79899 Other long term (current) drug therapy: Secondary | ICD-10-CM | POA: Diagnosis not present

## 2020-08-07 DIAGNOSIS — I2510 Atherosclerotic heart disease of native coronary artery without angina pectoris: Secondary | ICD-10-CM

## 2020-08-07 DIAGNOSIS — I2583 Coronary atherosclerosis due to lipid rich plaque: Secondary | ICD-10-CM | POA: Diagnosis not present

## 2020-08-07 DIAGNOSIS — Z950 Presence of cardiac pacemaker: Secondary | ICD-10-CM

## 2020-08-07 DIAGNOSIS — I152 Hypertension secondary to endocrine disorders: Secondary | ICD-10-CM

## 2020-08-07 DIAGNOSIS — E1165 Type 2 diabetes mellitus with hyperglycemia: Secondary | ICD-10-CM

## 2020-08-07 MED ORDER — CARVEDILOL 12.5 MG PO TABS: 12.5000 mg | ORAL_TABLET | Freq: Two times a day (BID) | ORAL | 11 refills | Status: DC

## 2020-08-07 NOTE — Patient Instructions (Addendum)
It was nice to see you today!  Your blood pressure goal is < 140/55mmHg and is excellent today  Decrease your clonidine to 1 tablet once daily. In 2 weeks, stop the clonidine. (It can cause fatigue and dry mouth).   Stop taking pindolol. Start taking carvedilol 1 tablets twice daily.  Continue taking your other medications. Take chlorthalidone in the morning. Take ramipril in the evening. We'll see if this helps even out your blood pressure throughout the day.  Limit your daily sodium intake to < 2,000mg   Please record your blood pressure at home (morning and evening readings would be helpful) and bring your log to your next visit with Dr Shari Prows.

## 2020-08-07 NOTE — Progress Notes (Signed)
Patient ID: Thomas [name]                 DOB: 03/08/1929                      MRN: [id number]     HPI: Thomas [name] is a 87 y.o. male referred by Dr. Shari Prows to HTN clinic. PMH is significant for HLD, DM2, CVA, HTN, CAD s/p MI at age 70 managed medically, subsequent PCIs and MI leading to CABG in 2001, SSS s/p PPM, recurrent episodes of fainting (resolved after PPM was placed), PE on warfarin managed by PCP, and hypothyroidism. He was seen by Dr Shari Prows on 07/21/20 and reported fluctuating home BP readings between 90/50s and 200/100s. BP in clinic was 126/76. Pt was advised to stop spironolactone due to K of 5.7 with subsequent goal to wean off clonidine and initiate beta blocker therapy in HTN clinic. Pravastatin was also changed to rosuvastatin 20mg  daily (LDL was above goal < 55 at 84). F/u BMET 1 week later showed K improved to 4.6. BP 104/62 at visit with Dr on 07/31/20.  Pt presents today with his son. Pt's wife coordinates his medications, pt is unsure of what he takes, son is familiar with names of most but isn't sure of dosing schedule. Has had 2 falls in the past 3 months or so, both when getting out of bed in the AM. Has not been needing to get up at night to use the restroom which is an improvement. Home BP readings fluctuate, no log brought today but states they can be as low as 90s/60s in the AM and as high as 180s systolic in the evening. Will feel weak when this happens. Confirmed pt stopped spironolactone as instructed at last visit. Son does report pt can be fuzzyheaded in the AM sometimes. Avoids NSAIDs.  Current HTN meds:  Chlorthalidone 25mg  daily Clonidine 0.1mg  BID Ramipril 10mg  daily Pindolol 5mg  BID  Previously tried: spironolactone 25mg  every other day - hyperkalemia (K 5.7)  BP goal: <140/96mmHg due to age and hx of fainting  Family History: The patient's family history includes Cancer in his brother and daughter; Heart disease in his brother, brother, and  sister; Hypertension in his son; Stroke in his father and mother.  Social History: Denies tobacco, alcohol, and illicit drug use.  Diet: Avoids caffeine. Does add salt to food.  Exercise: Limited due to age/prior stroke, ambulates via wheelchair.  Wt Readings from Last 3 Encounters:  07/31/20 168 lb 6.4 oz (76.4 kg)  07/21/20 170 lb 6.4 oz (77.3 kg)  07/17/20 169 lb 12.8 oz (77 kg)   BP Readings from Last 3 Encounters:  07/31/20 104/62  07/21/20 126/76  07/17/20 (!) 128/55   Pulse Readings from Last 3 Encounters:  07/31/20 74  07/21/20 73  07/17/20 70    Renal function: Estimated Creatinine Clearance: 38.2 mL/min (by C-G formula based on SCr of 1.29 mg/dL).  Past Medical History:  Diagnosis Date  . Arthritis   . Clotting disorder ([name])   . Coronary artery disease   . Diabetes ([name])   . Hyperlipidemia   . Hypertension   . Kidney disease   . Loss of hearing   . Macular degeneration   . Myocardial infarction ([name])   . Osteoarthritis   . Stroke [facility])     Current Outpatient Medications on File Prior to Visit  Medication Sig Dispense Refill  . acetaminophen (TYLENOL) 325 MG tablet Take 650 mg by  mouth every 6 (six) hours as needed.    Marland Kitchen aspirin 81 MG chewable tablet Chew 81 mg by mouth daily.    Marland Kitchen b complex vitamins capsule Take 1 capsule by mouth daily.    . chlorthalidone (HYGROTON) 25 MG tablet TAKE 1 TABLET BY MOUTH EVERY DAY AFTER BREAKFAST FOR BLOOD PRESSURE    . cholecalciferol (VITAMIN D3) 25 MCG (1000 UNIT) tablet Take 1,000 Units by mouth daily.    . cloNIDine (CATAPRES) 0.1 MG tablet Take 0.1 mg by mouth 2 (two) times daily.    Marland Kitchen co-enzyme Q-10 50 MG capsule Take 50 mg by mouth daily.    Marland Kitchen glucosamine-chondroitin 500-400 MG tablet Take 1 tablet by mouth 3 (three) times daily.    . insulin glargine (LANTUS) 100 unit/mL SOPN Inject 13 Units into the skin daily.    Marland Kitchen JANTOVEN 4 MG tablet Take 4 mg by mouth daily.    Marland Kitchen levothyroxine (SYNTHROID) 100 MCG tablet  Take 100 mcg by mouth daily.    . mirtazapine (REMERON) 7.5 MG tablet Take 7.5 mg by mouth at bedtime.    . Multiple Vitamin (MULTIVITAMIN) tablet Take 1 tablet by mouth daily.    . pindolol (VISKEN) 5 MG tablet Take 5 mg by mouth 2 (two) times daily.    . ramipril (ALTACE) 10 MG capsule Take 10 mg by mouth daily.    . Red Yeast Rice Extract (RED YEAST RICE PO) Take by mouth.    . rosuvastatin (CRESTOR) 20 MG tablet Take 1 tablet (20 mg total) by mouth daily. 90 tablet 0  . tamsulosin (FLOMAX) 0.4 MG CAPS capsule SMARTSIG:1 Capsule(s) By Mouth Every Evening    . warfarin (COUMADIN) 3 MG tablet TAKE 1 TABLET BY MOUTH DAILY ON TUESDAY, THURSDAY AND SATURDAY 38 tablet 0   No current facility-administered medications on file prior to visit.    Allergies  Allergen Reactions  . Amitriptyline   . Effexor Xr [Venlafaxine]   . Lyrica [Pregabalin]   . Morphine And Related   . Pravachol [Pravastatin]   . Zolpidem      Assessment/Plan:  1. Hypertension - BP excellent in clinic today and at goal <140/27mmHg (higher goal chosen due to pt's age and hx of falls). Will wean pt off of clonidine given his age and reported fatigue. Will decrease clonidine to 0.1mg  daily for the next 2 weeks, then will plan to stop (pt sees Dr Shari Prows for follow up in 2 weeks so can finish clonidine taper at that time). Will also stop pindolol and replace it with carvedilol 12.5mg  BID for better BP control in setting of clonidine taper. Pt to continue chlorthalidone 25mg  daily (advised to take in AM) and ramipril 10mg  daily (advised to take in PM). Provided him with BP log to keep track of home readings, may need to further adjust med timing if he continues to note large fluctuations in BP throughout the day. Encouraged pt to decrease sodium intake and limit to < 2,000mg  daily. Called pt's pharmacy to ensure they removed pindolol, clonidine, and spironolactone from his med list to avoid any future confusion. Can f/u with pt  as needed in HTN clinic after next visit with Dr .  Kesley Mullens E. Azim Gillingham, PharmD, BCACP, CPP [facility] [address], [place], [address] [place] Phone: [phone]; Fax: [phone] 08/07/2020 2:49 PM

## 2020-08-08 LAB — BASIC METABOLIC PANEL
BUN/Creatinine Ratio: 29 — ABNORMAL HIGH (ref 10–24)
BUN: 33 mg/dL — ABNORMAL HIGH (ref 8–27)
CO2: 18 mmol/L — ABNORMAL LOW (ref 20–29)
Calcium: 9.1 mg/dL (ref 8.6–10.2)
Chloride: 107 mmol/L — ABNORMAL HIGH (ref 96–106)
Creatinine, Ser: 1.14 mg/dL (ref 0.76–1.27)
GFR calc Af Amer: 66 mL/min/{1.73_m2} (ref >59)
GFR calc non Af Amer: 57 mL/min/{1.73_m2} — ABNORMAL LOW (ref >59)
Glucose: 167 mg/dL — ABNORMAL HIGH (ref 65–99)
Potassium: 5.3 mmol/L — ABNORMAL HIGH (ref 3.5–5.2)
Sodium: 139 mmol/L (ref 134–144)

## 2020-08-08 LAB — ECHOCARDIOGRAM COMPLETE
Area-P 1/2: 2.22 cm2
S' Lateral: 2.1 cm

## 2020-08-12 ENCOUNTER — Ambulatory Visit: Payer: Medicare Other | Admitting: Physician Assistant

## 2020-08-12 ENCOUNTER — Encounter: Payer: Self-pay | Admitting: Physician Assistant

## 2020-08-12 VITALS — BP 130/76 | HR 68 | Ht 68.5 in | Wt 169.0 lb

## 2020-08-12 DIAGNOSIS — R197 Diarrhea, unspecified: Secondary | ICD-10-CM

## 2020-08-12 DIAGNOSIS — R634 Abnormal weight loss: Secondary | ICD-10-CM

## 2020-08-12 DIAGNOSIS — Z9049 Acquired absence of other specified parts of digestive tract: Secondary | ICD-10-CM | POA: Diagnosis not present

## 2020-08-12 DIAGNOSIS — Z86010 Personal history of colonic polyps: Secondary | ICD-10-CM | POA: Diagnosis not present

## 2020-08-12 NOTE — Patient Instructions (Signed)
If you are age 85 or older, your body mass index should be between 23-30. Your Body mass index is 25.32 kg/m. If this is out of the aforementioned range listed, please consider follow up with your Primary Care Provider.  If you are age 5 or younger, your body mass index should be between 19-25. Your Body mass index is 25.32 kg/m. If this is out of the aformentioned range listed, please consider follow up with your Primary Care Provider.   Your provider has requested that you go to the basement level for lab work before leaving today. Press "B" on the elevator. The lab is located at the first door on the left as you exit the elevator.  You have been scheduled for a CT scan of the abdomen and pelvis at [facility], 1st floor Radiology. You are scheduled on 08/19/2020  at 5:00 pm. You should arrive 15 minutes prior to your appointment time for registration.  Please pick up 2 bottles of contrast from [place] at least 3 days prior to your scan. The solution may taste better if refrigerated, but do NOT add ice or any other liquid to this solution. Shake well before drinking.   Please follow the written instructions below on the day of your exam:   1) Do not eat anything after 1:00 pm (4 hours prior to your test)   2) Drink 1 bottle of contrast @ 3:00 pm (2 hours prior to your exam)  Remember to shake well before drinking and do NOT pour over ice.     Drink 1 bottle of contrast @ 4:00 pm (1 hour prior to your exam)   You may take any medications as prescribed with a small amount of water, if necessary. If you take any of the following medications: METFORMIN, GLUCOPHAGE, GLUCOVANCE, AVANDAMET, RIOMET, FORTAMET, [place] MET, JANUMET, GLUMETZA or METAGLIP, you MAY be asked to HOLD this medication 48 hours AFTER the exam.   The purpose of you drinking the oral contrast is to aid in the visualization of your intestinal tract. The contrast solution may cause some diarrhea. Depending on your  individual set of symptoms, you may also receive an intravenous injection of x-ray contrast/dye. Plan on being at [facility] for 45 minutes or longer, depending on the type of exam you are having performed.   If you have any questions regarding your exam or if you need to reschedule, you may call [facility] at [phone] between the hours of 8:00 am and 5:00 pm, Monday-Friday.   Start Benefiber 1 dose daily in 8 ounces of water or juice daily.  Follow up pending Labs and CT, or as needed.  Thank you for entrusting me with your care and choosing [facility].  Amy Esterwood, PA-C

## 2020-08-13 ENCOUNTER — Encounter: Payer: Self-pay | Admitting: Physician Assistant

## 2020-08-13 NOTE — Progress Notes (Signed)
Subjective:    Patient ID: Thomas [name], male    DOB: February 06, 1929, 87 y.o.   MRN: [id number]  HPI Thomas [name] is a pleasant 87 year old white male, new to GI today referred by Dr. [name] for evaluation of persistent diarrhea, and weight loss. Patient has multiple comorbidities including history of coronary artery disease status post remote CABG in 2001.  He reports having stents prior to the CABG and also had at least 2 stents placed in later years post CABG.  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  Patient is diabetic has osteoarthritis and reports history of numerous colon polyps. Patient says that he had about 6 feet of his intestine removed at age 74 because he had numerous polyps.  He says he had every 5 year colonoscopy after that and as far as he is aware had polyps at every colonoscopy.  He was told that he would not have colonoscopy after age 3, and says he was not pleased with that decision at that time.  Signed Patient and his wife relocated to [place] to be with family this past August, from [place].  He says that he started having some problems with diarrhea right before the move and the diarrhea has persisted ever since over the past 6 to 7 months total. His weight is down about 10 pounds over that time.  He denies any abdominal pain or discomfort, no abdominal bloating or cramping.  He has 2-3 loose stools per day which are generally nonurgent, he has not noted any melena or hematochezia.  He will go at times for couple of days without a bowel movement, and when he finally does go again stool will be loose.  His son feels that he has been eating well.,  He has been drinking more water with encouragement and has been drinking a Glucerna every day for protein supplement. Patient had not been on any antibiotics at onset of symptoms, and had not been started on any new  medications around that time. Patient himself says that he is most concerned about making sure that he does not have a colon cancer. On further discussion about potential procedures he reports that he was told by his cardiologist several years ago never to have any more surgeries.  Review of Systems Pertinent positive and negative review of systems were noted in the above HPI section.  All other review of systems was otherwise negative.  Outpatient Encounter Medications as of 08/12/2020  Medication Sig  . acetaminophen (TYLENOL) 325 MG tablet Take 650 mg by mouth every 6 (six) hours as needed.  Marland Kitchen aspirin 81 MG chewable tablet Chew 81 mg by mouth daily.  Marland Kitchen b complex vitamins capsule Take 1 capsule by mouth daily.  . carvedilol (COREG) 12.5 MG tablet Take 1 tablet (12.5 mg total) by mouth 2 (two) times daily.  . chlorthalidone (HYGROTON) 25 MG tablet TAKE 1 TABLET BY MOUTH EVERY DAY AFTER BREAKFAST FOR BLOOD PRESSURE  . cholecalciferol (VITAMIN D3) 25 MCG (1000 UNIT) tablet Take 1,000 Units by mouth daily.  . cloNIDine (CATAPRES) 0.1 MG tablet Take 0.1 mg by mouth daily.  Marland Kitchen co-enzyme Q-10 50 MG capsule Take 50 mg by mouth daily.  Marland Kitchen glucosamine-chondroitin 500-400 MG tablet Take 1 tablet by mouth 3 (three) times daily.  . insulin glargine (LANTUS) 100 unit/mL SOPN Inject 13 Units into the skin daily.  Marland Kitchen JANTOVEN  4 MG tablet Take 4 mg by mouth daily.  Marland Kitchen levothyroxine (SYNTHROID) 100 MCG tablet Take 100 mcg by mouth daily.  . mirtazapine (REMERON) 7.5 MG tablet Take 7.5 mg by mouth at bedtime.  . Multiple Vitamin (MULTIVITAMIN) tablet Take 1 tablet by mouth daily.  . ramipril (ALTACE) 10 MG capsule Take 10 mg by mouth daily.  . Red Yeast Rice Extract (RED YEAST RICE PO) Take by mouth.  . rosuvastatin (CRESTOR) 20 MG tablet Take 1 tablet (20 mg total) by mouth daily.  . tamsulosin (FLOMAX) 0.4 MG CAPS capsule SMARTSIG:1 Capsule(s) By Mouth Every Evening  . warfarin (COUMADIN) 3 MG tablet TAKE 1  TABLET BY MOUTH DAILY ON TUESDAY, THURSDAY AND SATURDAY   No facility-administered encounter medications on file as of 08/12/2020.   Allergies  Allergen Reactions  . Amitriptyline   . Effexor Xr [Venlafaxine]   . Lyrica [Pregabalin]   . Morphine And Related   . Pravachol [Pravastatin]   . Zolpidem    Patient Active Problem List   Diagnosis Date Noted  . Sick sinus syndrome ([name]) 07/31/2020  . Coronary artery disease s/p CABG 2001 07/17/2020  . Hypothyroidism 07/17/2020  . Venous thromboembolism (VTE) 07/17/2020  . Hypertension 07/17/2020  . Cardiac pacemaker 07/17/2020  . Osteoarthritis 07/17/2020  . Type 2 diabetes mellitus with hyperglycemia ([name]) 07/17/2020  . Vitamin D deficiency 07/17/2020  . Macular degeneration 07/17/2020  . Dyslipidemia due to type 2 diabetes mellitus ([name]) 07/17/2020  . Insomnia 07/17/2020  . BPH (benign prostatic hyperplasia) 07/17/2020  . Colon polyps 07/17/2020   Social History   Socioeconomic History  . Marital status: Married    Spouse name: Not on file  . Number of children: Not on file  . Years of education: Not on file  . Highest education level: Not on file  Occupational History  . Not on file  Tobacco Use  . Smoking status: Never Smoker  . Smokeless tobacco: Never Used  Substance and Sexual Activity  . Alcohol use: Never  . Drug use: Never  . Sexual activity: Not on file  Other Topics Concern  . Not on file  Social History Narrative  . Not on file   Social Determinants of Health   Financial Resource Strain: Not on file  Food Insecurity: Not on file  Transportation Needs: Not on file  Physical Activity: Not on file  Stress: Not on file  Social Connections: Not on file  Intimate Partner Violence: Not on file    [name] family history includes Cancer in his brother and daughter; Heart disease in his brother, brother, and sister; Hypertension in his son; Stroke in his father and mother.      Objective:     Vitals:   08/12/20 1517  BP: 130/76  Pulse: 68    Physical Exam Well-developed well-nourished elderly white male in no acute distress.  Patient is sitting in a wheelchair but is ambulatory at home , accompanied by his son  weight, BMI 25.3  HEENT; nontraumatic normocephalic, EOMI, PE R LA, sclera anicteric. Oropharynx; not examined today Neck; supple, no JVD Cardiovascular; regular rate and rhythm with S1-S2, soft systolic murmur Pulmonary; Clear bilaterally Abdomen; soft, nontender, nondistended, no palpable mass or hepatosplenomegaly, bowel sounds are active Rectal; not done today Skin; benign exam, no jaundice rash or appreciable lesions Extremities; no clubbing cyanosis or edema skin warm and dry Neuro/Psych; alert and oriented x4, grossly nonfocal mood and affect appropriate       Assessment &  Plan:   #54 87 year old white male with 6 to 91-month history of persistent change in bowel habits with diarrhea which is usually 2-3 loose bowel movements per day.  This is been associated with about a 10 pound weight loss. Diarrhea started just prior to patient relocating to [facility] to be with family and has persisted since.  No associated abdominal pain or cramping no melena or hematochezia.  Patient is status post remote colon resection at age 84 for multiple colon polyps and has history of multiple colon polyps at surveillance colonoscopy since per patient report.  Prior colonoscopies had been done in [place].  Etiology of current symptoms is not clear. Patient is a high risk endoscopic candidate and would prefer to avoid colonoscopy at this point if at all possible. Rule out occult colon neoplasm, rule out pancreatic insufficiency, rule out infectious though less likely  #2 coronary artery disease status post prior CABG and multiple stents 3.  Chronic anticoagulation-on Coumadin 4.  History of DVT/PE 5.  Sick sinus syndrome status post pacemaker 6.  Adult onset diabetes  mellitus 7.  Osteoarthritis  Plan; recent labs had been done by PCP, these were reviewed and unrevealing.  Hemoglobin 11.5 Patient will be scheduled for CT scan of the abdomen and pelvis with contrast. Check fecal elastase, stool for lactoferrin and stool for O&P Add trial of Benefiber 1 scoop daily in a glass of water Further recommendations pending findings of above.  Patient will be established with Dr. Christella Hartigan.   Azjah Pardo Oswald Hillock PA-C 08/13/2020   Cc: Ardith Dark, MD

## 2020-08-14 NOTE — Progress Notes (Signed)
I agree with the above note, plan 

## 2020-08-18 ENCOUNTER — Other Ambulatory Visit: Payer: Medicare Other

## 2020-08-18 ENCOUNTER — Telehealth: Payer: Self-pay

## 2020-08-18 DIAGNOSIS — Z9049 Acquired absence of other specified parts of digestive tract: Secondary | ICD-10-CM

## 2020-08-18 DIAGNOSIS — Z86010 Personal history of colonic polyps: Secondary | ICD-10-CM

## 2020-08-18 DIAGNOSIS — R197 Diarrhea, unspecified: Secondary | ICD-10-CM | POA: Diagnosis not present

## 2020-08-18 DIAGNOSIS — R634 Abnormal weight loss: Secondary | ICD-10-CM | POA: Diagnosis not present

## 2020-08-18 NOTE — Telephone Encounter (Signed)
..   LAST APPOINTMENT DATE: 07/17/2020   NEXT APPOINTMENT DATE:@4 /22/2022  MEDICATION:levothyroxine (SYNTHROID) 100 MCG tablet    PHARMACY:[id number] - [place], [place] - [address]  Pt requesting 90 day supply

## 2020-08-19 ENCOUNTER — Emergency Department (HOSPITAL_COMMUNITY): Payer: Medicare Other

## 2020-08-19 ENCOUNTER — Ambulatory Visit (HOSPITAL_COMMUNITY)
Admit: 2020-08-19 | Discharge: 2020-08-19 | Disposition: A | Discharge status: Home / Self Care | Payer: Medicare Other | Source: Home / Self Care | Attending: *Deleted | Admitting: *Deleted

## 2020-08-19 ENCOUNTER — Emergency Department (HOSPITAL_COMMUNITY)
Admission: EM | Admit: 2020-08-19 | Discharge: 2020-08-19 | Disposition: A | Discharge status: Home / Self Care | Payer: Medicare Other | Attending: Emergency Medicine | Admitting: Emergency Medicine

## 2020-08-19 ENCOUNTER — Ambulatory Visit (HOSPITAL_COMMUNITY): Admission: RE | Admit: 2020-08-19 | Payer: Medicare Other | Source: Ambulatory Visit

## 2020-08-19 ENCOUNTER — Other Ambulatory Visit: Payer: Self-pay | Admitting: *Deleted

## 2020-08-19 ENCOUNTER — Encounter (HOSPITAL_COMMUNITY): Payer: Self-pay

## 2020-08-19 ENCOUNTER — Other Ambulatory Visit: Payer: Self-pay

## 2020-08-19 DIAGNOSIS — Z794 Long term (current) use of insulin: Secondary | ICD-10-CM | POA: Diagnosis not present

## 2020-08-19 DIAGNOSIS — R197 Diarrhea, unspecified: Secondary | ICD-10-CM | POA: Diagnosis not present

## 2020-08-19 DIAGNOSIS — R202 Paresthesia of skin: Secondary | ICD-10-CM | POA: Insufficient documentation

## 2020-08-19 DIAGNOSIS — I6389 Other cerebral infarction: Secondary | ICD-10-CM | POA: Diagnosis not present

## 2020-08-19 DIAGNOSIS — R531 Weakness: Secondary | ICD-10-CM | POA: Insufficient documentation

## 2020-08-19 DIAGNOSIS — W19XXXA Unspecified fall, initial encounter: Secondary | ICD-10-CM | POA: Insufficient documentation

## 2020-08-19 DIAGNOSIS — N189 Chronic kidney disease, unspecified: Secondary | ICD-10-CM | POA: Diagnosis not present

## 2020-08-19 DIAGNOSIS — E1165 Type 2 diabetes mellitus with hyperglycemia: Secondary | ICD-10-CM | POA: Insufficient documentation

## 2020-08-19 DIAGNOSIS — E039 Hypothyroidism, unspecified: Secondary | ICD-10-CM | POA: Insufficient documentation

## 2020-08-19 DIAGNOSIS — I10 Essential (primary) hypertension: Secondary | ICD-10-CM | POA: Diagnosis not present

## 2020-08-19 DIAGNOSIS — Z7901 Long term (current) use of anticoagulants: Secondary | ICD-10-CM | POA: Diagnosis not present

## 2020-08-19 DIAGNOSIS — R4781 Slurred speech: Secondary | ICD-10-CM | POA: Diagnosis not present

## 2020-08-19 DIAGNOSIS — R109 Unspecified abdominal pain: Secondary | ICD-10-CM | POA: Insufficient documentation

## 2020-08-19 DIAGNOSIS — Z955 Presence of coronary angioplasty implant and graft: Secondary | ICD-10-CM | POA: Insufficient documentation

## 2020-08-19 DIAGNOSIS — Z951 Presence of aortocoronary bypass graft: Secondary | ICD-10-CM | POA: Diagnosis not present

## 2020-08-19 DIAGNOSIS — Z950 Presence of cardiac pacemaker: Secondary | ICD-10-CM | POA: Insufficient documentation

## 2020-08-19 DIAGNOSIS — Z7982 Long term (current) use of aspirin: Secondary | ICD-10-CM | POA: Diagnosis not present

## 2020-08-19 DIAGNOSIS — Z79899 Other long term (current) drug therapy: Secondary | ICD-10-CM | POA: Diagnosis not present

## 2020-08-19 DIAGNOSIS — I2510 Atherosclerotic heart disease of native coronary artery without angina pectoris: Secondary | ICD-10-CM | POA: Diagnosis not present

## 2020-08-19 DIAGNOSIS — S2242XA Multiple fractures of ribs, left side, initial encounter for closed fracture: Secondary | ICD-10-CM | POA: Diagnosis not present

## 2020-08-19 DIAGNOSIS — R519 Headache, unspecified: Secondary | ICD-10-CM | POA: Diagnosis not present

## 2020-08-19 DIAGNOSIS — I129 Hypertensive chronic kidney disease with stage 1 through stage 4 chronic kidney disease, or unspecified chronic kidney disease: Secondary | ICD-10-CM | POA: Diagnosis not present

## 2020-08-19 DIAGNOSIS — I6782 Cerebral ischemia: Secondary | ICD-10-CM | POA: Insufficient documentation

## 2020-08-19 DIAGNOSIS — G459 Transient cerebral ischemic attack, unspecified: Secondary | ICD-10-CM | POA: Insufficient documentation

## 2020-08-19 DIAGNOSIS — Y92009 Unspecified place in unspecified non-institutional (private) residence as the place of occurrence of the external cause: Secondary | ICD-10-CM | POA: Insufficient documentation

## 2020-08-19 DIAGNOSIS — G9389 Other specified disorders of brain: Secondary | ICD-10-CM | POA: Diagnosis not present

## 2020-08-19 DIAGNOSIS — Z043 Encounter for examination and observation following other accident: Secondary | ICD-10-CM | POA: Diagnosis not present

## 2020-08-19 DIAGNOSIS — I959 Hypotension, unspecified: Secondary | ICD-10-CM | POA: Diagnosis not present

## 2020-08-19 LAB — BASIC METABOLIC PANEL
Anion gap: 10 (ref 5–15)
BUN: 51 mg/dL — ABNORMAL HIGH (ref 8–23)
CO2: 18 mmol/L — ABNORMAL LOW (ref 22–32)
Calcium: 8.5 mg/dL — ABNORMAL LOW (ref 8.9–10.3)
Chloride: 108 mmol/L (ref 98–111)
Creatinine, Ser: 1.2 mg/dL (ref 0.61–1.24)
GFR, Estimated: 58 mL/min — ABNORMAL LOW (ref >60)
Glucose, Bld: 277 mg/dL — ABNORMAL HIGH (ref 70–99)
Potassium: 5 mmol/L (ref 3.5–5.1)
Sodium: 136 mmol/L (ref 135–145)

## 2020-08-19 LAB — CBC WITH DIFFERENTIAL/PLATELET
Abs Immature Granulocytes: 0.06 10*3/uL (ref 0.00–0.07)
Basophils Absolute: 0 10*3/uL (ref 0.0–0.1)
Basophils Relative: 0 %
Eosinophils Absolute: 0.1 10*3/uL (ref 0.0–0.5)
Eosinophils Relative: 1 %
HCT: 32.3 % — ABNORMAL LOW (ref 39.0–52.0)
Hemoglobin: 10.6 g/dL — ABNORMAL LOW (ref 13.0–17.0)
Immature Granulocytes: 0 %
Lymphocytes Relative: 15 %
Lymphs Abs: 2 10*3/uL (ref 0.7–4.0)
MCH: 32.5 pg (ref 26.0–34.0)
MCHC: 32.8 g/dL (ref 30.0–36.0)
MCV: 99.1 fL (ref 80.0–100.0)
Monocytes Absolute: 0.5 10*3/uL (ref 0.1–1.0)
Monocytes Relative: 4 %
Neutro Abs: 10.7 10*3/uL — ABNORMAL HIGH (ref 1.7–7.7)
Neutrophils Relative %: 80 %
Platelets: 156 10*3/uL (ref 150–400)
RBC: 3.26 MIL/uL — ABNORMAL LOW (ref 4.22–5.81)
RDW: 13 % (ref 11.5–15.5)
WBC: 13.4 10*3/uL — ABNORMAL HIGH (ref 4.0–10.5)
nRBC: 0 % (ref 0.0–0.2)

## 2020-08-19 MED ORDER — IOHEXOL 300 MG/ML  SOLN
100.0000 mL | Freq: Once | INTRAMUSCULAR | Status: AC | PRN
  Administered 2020-08-19: 100 mL via INTRAVENOUS

## 2020-08-19 MED ORDER — SODIUM CHLORIDE 0.9 % IV BOLUS
1000.0000 mL | Freq: Once | INTRAVENOUS | Status: AC
  Administered 2020-08-19: 1000 mL via INTRAVENOUS

## 2020-08-19 MED ORDER — LEVOTHYROXINE SODIUM 100 MCG PO TABS: 100.0000 ug | ORAL_TABLET | Freq: Every day | ORAL | 0 refills | Status: DC

## 2020-08-19 NOTE — ED Notes (Signed)
Pt returned from MRI via [facility].

## 2020-08-19 NOTE — Discharge Instructions (Signed)
As discussed, your CT abdomen showed concerns about cancer. I have spoke to your PCP in regards to these results who will follow-up with some additional lab work. Please call today to schedule an appointment with your PCP for further evaluation.  You most likely fell today because you were dehydrated from your chronic diarrhea. Continue drinking extra fluids. Follow-up with GI this week for further evaluation.  Return to the ER for new or worsening symptoms.

## 2020-08-19 NOTE — ED Notes (Signed)
Spoke with son Marquavis Hannen. Claims father (pt) states he is being discharged and wants to be picked up. No knowledge of d/c at this time. However, son did state that he would come pick up pt if and when he is d/c. Son was informed that if pt is d/c he will be called by nurse at that time.

## 2020-08-19 NOTE — Progress Notes (Signed)
Patient transferred from [facility] ED for stat MRI brain wo contrast. [facility] express sent to [facility] and Andy-cardiology PA. Orders received for DOO 85. Will reprogram patient post scan.

## 2020-08-19 NOTE — Progress Notes (Signed)
Informed of MRI for today.   Device system confirmed to be MRI conditional, with implant date > 6 weeks ago and no evidence of abandoned or epicardial leads in review of most recent CXR Interrogation from today reviewed, pt is currently AP-VS at 72 bpm Change device settings for MRI to DOO at 85 bpm  Tachy-therapies to off if applicable.  Program device back to pre-MRI settings after completion of exam.  Wolfgang, Finigan  08/19/2020 2:02 PM

## 2020-08-19 NOTE — ED Triage Notes (Signed)
Pt arrives EMS after a fall at home. Upon EMS arrival pt was orthostatic. IV placed. 900cc of NS administered with improvement.

## 2020-08-19 NOTE — Telephone Encounter (Signed)
Rx send to [facility] pharmacy  

## 2020-08-19 NOTE — ED Provider Notes (Signed)
Received patient as a handoff at shift change from [facility], [place].  In short, patient is an 87 year old male with PMH of sick sinus syndrome with pacemaker in place, CAD s/p CABG, HTN, CVA, history of PE/DVT on Coumadin, and DM who presents the ED after sustaining fall. EMS noted orthostatic hypotension likely due to his chronic diarrhea and decreased p.o. intake recently. On arrival to ER, his vital signs were all within normal limits and reassuring. Patient had been sent to [facility] in order to obtain brain MRI given his pacemaker placement. Pacemaker was interrogated with no overnight events. CT abdomen pelvis was obtained which revealed findings concerning for lytic lesions, possibly related to multiple myeloma or metastatic disease. Recommending MRI for further delineation. MRI obtained was without any acute intracranial normalities or infarct.  Multiple myeloma panel has been ordered and patient's primary care provider had been message regarding CT abdomen findings. These results were also discussed with the patient. AVS had already been completed by handoff provider at time of handoff. Wanted to ensure that patient's orthostatics have improved and that patient was ambulatory prior to discharge home. Patient has been evaluated personally by Dr. Gilford Raid who agreed with and confirmed plan for discharge.  On my examination, patient is chronically ill-appearing.  He states that he is hard of hearing and wants to go home.  There is a note from his son that he is coming to pick up his father and he wants him to be discharged.  I will call son to confirm.  He still has positive orthostatics, however patient states that this is chronic and that he had falls all the time when he lived in [place] that the EMS personnel knew him by name due to these incidents.  He also states that he had a feeling that he had cancer due to his 81-monthhistory of diarrhea.  He states that he will follow-up with his  primary care provider.  He has an assistive walker at home and knows to exercise increased precautions while ambulating due to his orthostatic hypotension.  ED return precautions discussed.  Patient voices understanding and is agreeable.  I spoke with son, JCorene Cornea to confirm that he is arriving to ED to pick him up.   GCorena Herter PA-C 08/19/20 1Gail Ankit, MD 08/19/20 2028

## 2020-08-19 NOTE — Progress Notes (Signed)
Cardiology Office Note:    Date:  08/22/2020   ID:  Thomas [name], DOB [birth year], MRN [id number]  PCP:  Thomas Barrack, MD   Thomas [name]  Cardiologist:  No primary care Thomas [name] on file.  Advanced Practice Thomas [name]:  No care team member to display Electrophysiologist:  None   Referring MD: Thomas Barrack, MD    History of Present Illness:    Thomas [name] is a 87 y.o. male with a hx of HLD, DMII, prior CVA, HTN, CAD s/p CABG in 2001, SSS s/p PPM, pulmonary embolism on warfarin and hypothyroidism who presents to clinic for follow-up of his CAD.  Patient has a history of MI at age 82 that was managed medically at that time. He had subsequent PCIs and MI in 2001 leading to bypass surgery. He states he has had no chest pain or shortness of breath since his surgery. No stents placed after bypass surgery. Blood pressures fluctuate significantly at home from 90/50s to 200s/100s. Was seen in HTN clinic and where his clonidine was weaned, pindolol was changed to coreg; ramipril and chlorthalidone were continued at current doses. Also saw Dr. Lovena [name] with EP to manage his medtronic PPM which was functioning well.   TTE showed LVEF 60-65%, moderate basal-septal hypertrophy, G1DD, dilated aorta/aortic root, moderate aortic sclerosis without stenosis, moderate MAC, mild MR.  Had recent fall where he was seen in ED. CT head negative for acute pathology. MRI head with remote strokes (known) but no acute pathology. Has been doing okay since that time.   Today, the patient states that he is doing overall well. Blood pressure is well controlled running 110-120s mainly. Has been having episodes of right sided weakness after sleeping on his right side. Resolves after going back to sleep and waking up. Has occurred several times which he thinks may be related to his sleeping position. Of note, had prior CVA in 2010 with right sided weakness but no residual symptoms. Recent MRI and  CT head without acute pathology. On warfarin without bleeding issues.  Past Medical History:  Diagnosis Date  . Arthritis   . Clotting disorder ([place])   . Coronary artery disease   . Diabetes ([place])   . Hyperlipidemia   . Hypertension   . Kidney disease   . Loss of hearing   . Macular degeneration   . Myocardial infarction ([place])   . Osteoarthritis   . Stroke [facility])     Past Surgical History:  Procedure Laterality Date  . CARPAL TUNNEL RELEASE  2011  . CORONARY ANGIOPLASTY WITH STENT PLACEMENT    . CORONARY ARTERY BYPASS GRAFT  2001   x6  . REPLACEMENT TOTAL KNEE    . SPINE SURGERY  2008   cervical and lumbar  . THYROIDECTOMY      Current Medications: Current Meds  Medication Sig  . acetaminophen (TYLENOL) 325 MG tablet Take 650 mg by mouth every 6 (six) hours as needed.  Marland Kitchen aspirin 81 MG chewable tablet Chew 81 mg by mouth daily.  Marland Kitchen b complex vitamins capsule Take 1 capsule by mouth daily.  . carvedilol (COREG) 12.5 MG tablet Take 1 tablet (12.5 mg total) by mouth 2 (two) times daily.  . chlorthalidone (HYGROTON) 25 MG tablet TAKE 1 TABLET BY MOUTH EVERY DAY AFTER BREAKFAST FOR BLOOD PRESSURE  . cholecalciferol (VITAMIN D3) 25 MCG (1000 UNIT) tablet Take 1,000 Units by mouth daily.  Marland Kitchen co-enzyme Q-10 50 MG capsule Take 50 mg by mouth daily.  Marland Kitchen  glucosamine-chondroitin 500-400 MG tablet Take 1 tablet by mouth 3 (three) times daily.  . insulin glargine (LANTUS) 100 unit/mL SOPN Inject 13 Units into the skin daily.  Marland Kitchen JANTOVEN 4 MG tablet Take 4 mg by mouth daily.  Marland Kitchen levothyroxine (SYNTHROID) 100 MCG tablet Take 1 tablet (100 mcg total) by mouth daily.  . mirtazapine (REMERON) 7.5 MG tablet Take 7.5 mg by mouth at bedtime.  . Multiple Vitamin (MULTIVITAMIN) tablet Take 1 tablet by mouth daily.  . ramipril (ALTACE) 10 MG capsule Take 10 mg by mouth daily.  . Red Yeast Rice Extract (RED YEAST RICE PO) Take by mouth.  . rosuvastatin (CRESTOR) 20 MG tablet Take 1 tablet (20 mg  total) by mouth daily.  . tamsulosin (FLOMAX) 0.4 MG CAPS capsule SMARTSIG:1 Capsule(s) By Mouth Every Evening  . warfarin (COUMADIN) 3 MG tablet TAKE 1 TABLET BY MOUTH DAILY ON TUESDAY, THURSDAY AND SATURDAY  . [DISCONTINUED] cloNIDine (CATAPRES) 0.1 MG tablet Take 0.1 mg by mouth daily.     Allergies:   Amitriptyline, Effexor xr [venlafaxine], Lyrica [pregabalin], Morphine, Morphine and related, Pravachol [pravastatin], and Zolpidem   Social History   Socioeconomic History  . Marital status: Married    Spouse name: Not on file  . Number of children: Not on file  . Years of education: Not on file  . Highest education level: Not on file  Occupational History  . Not on file  Tobacco Use  . Smoking status: Never Smoker  . Smokeless tobacco: Never Used  Substance and Sexual Activity  . Alcohol use: Never  . Drug use: Never  . Sexual activity: Not on file  Other Topics Concern  . Not on file  Social History Narrative  . Not on file   Social Determinants of Health   Financial Resource Strain: Not on file  Food Insecurity: Not on file  Transportation Needs: Not on file  Physical Activity: Not on file  Stress: Not on file  Social Connections: Not on file     Family History: The patient's family history includes Cancer in his brother and daughter; Heart disease in his brother, brother, and sister; Hypertension in his son; Stroke in his father and mother.  ROS:   Please see the history of present illness.    Review of Systems  Constitutional: Negative for chills and fever.  HENT: Positive for hearing loss. Negative for sore throat.   Eyes: Negative for blurred vision and redness.  Respiratory: Negative for cough and shortness of breath.   Cardiovascular: Negative for chest pain, palpitations, orthopnea, claudication, leg swelling and PND.  Gastrointestinal: Negative for blood in stool, melena, nausea and vomiting.  Genitourinary: Negative for hematuria.  Musculoskeletal:  Positive for falls and joint pain.  Skin: Negative for rash.  Neurological: Positive for dizziness. Negative for loss of consciousness.  Endo/Heme/Allergies: Negative for polydipsia.  Psychiatric/Behavioral: Negative for substance abuse.    EKGs/Labs/Other Studies Reviewed:    The following studies were reviewed today: CT 08/19/20: IMPRESSION: Atrophy with periventricular small vessel disease. Prior small infarcts in each thalamus as well as in the anterior limb of the left external capsule. No acute infarct is evident. No mass or hemorrhage.  There are foci of arterial vascular calcification. There are foci of paranasal sinus disease.  CT abdomen/pelvis: FINDINGS: Lower chest: Visualized lung bases are unremarkable. Moderate size sliding-type hiatal hernia is noted.  Hepatobiliary: No focal liver abnormality is seen. No gallstones, gallbladder wall thickening, or biliary dilatation.  Pancreas: Unremarkable. No pancreatic  ductal dilatation or surrounding inflammatory changes.  Spleen: Normal in size without focal abnormality.  Adrenals/Urinary Tract: Adrenal glands are unremarkable. Kidneys are normal, without renal calculi, focal lesion, or hydronephrosis. Bladder is unremarkable.  Stomach/Bowel: There is no evidence of bowel obstruction or inflammation. Diverticulosis of descending and sigmoid colon is noted without inflammation. The appendix is not visualized.  Vascular/Lymphatic: Aortic atherosclerosis. No enlarged abdominal or pelvic lymph nodes.  Reproductive: Prostate is unremarkable.  Other: Small fat containing periumbilical hernia is noted. No ascites is noted.  Musculoskeletal: Multiple small lucencies are noted throughout the pelvis and visualized proximal femurs concerning for possible lytic lesions related to multiple myeloma or metastatic disease.  IMPRESSION: 1. Moderate size sliding-type hiatal hernia. 2. Diverticulosis of descending  and sigmoid colon is noted without inflammation. 3. Small fat containing periumbilical hernia. 4. Multiple small lucencies are noted throughout the pelvis and visualized proximal femurs concerning for possible lytic lesions related to multiple myeloma or metastatic disease. MRI may be performed for further evaluation.  MRI 08/19/20: FINDINGS: Brain: No evidence of acute infarct. Remote infarct in the lateral right temporal lobe. Additional remote lacunar infarcts involving the bilateral thalami and bilateral cerebellar hemispheres. Additional small T2 hyperintensities in the basal ganglia most likely represent dilated perivascular spaces. There is advanced/confluent T2/FLAIR hyperintensities within the white matter and pons, most likely related to chronic microvascular ischemic disease. Punctate area of susceptibility artifact in the right cerebellar hemisphere, likely related to prior microhemorrhage. Mild generalized cerebral volume loss. No hydrocephalus. Cavum septum pellucidum et vergae, anatomic variant. No acute hemorrhage. No mass lesion or abnormal mass effect. No extra-axial fluid collection.  Vascular: Major arterial flow voids are maintained at the skull base.  Skull and upper cervical spine: Normal marrow signal. Partially imaged upper cervical spine degenerative change.  Sinuses/Orbits: Right maxillary sinus retention cyst versus polyp. Scattered ethmoid air cell mucosal thickening without air-fluid levels. Unremarkable orbits.  Other: No sizable mastoid effusions.  IMPRESSION: 1. No evidence of acute intracranial abnormality.  No acute infarct. 2. Remote right temporal lobe and bilateral basal ganglia and cerebellar infarcts. 3. Advanced chronic microvascular ischemic disease.  TTE 08/07/20: IMPRESSIONS  1. Left ventricular ejection fraction, by estimation, is 60 to 65%. The  left ventricle has normal function. The left ventricle has no regional  wall  motion abnormalities. There is moderate left ventricular hypertrophy  of the basal-septal segment. Left  ventricular diastolic parameters are consistent with Grade I diastolic  dysfunction (impaired relaxation). Elevated left ventricular end-diastolic  pressure.  2. Right ventricular systolic function is mildly reduced. The right  ventricular size is normal.  3. The mitral valve is degenerative. Mild mitral valve regurgitation. No  evidence of mitral stenosis. Moderate mitral annular calcification.  4. The aortic valve is normal in structure. There is mild calcification  of the aortic valve. There is mild thickening of the aortic valve. Aortic  valve regurgitation is not visualized. Mild to moderate aortic valve  sclerosis/calcification is present,  without any evidence of aortic stenosis.  5. Aortic dilatation noted. There is mild dilatation of the aortic root,  measuring 40 mm. There is mild dilatation of the ascending aorta,  measuring 43 mm.    Recent Labs: 07/17/2020: ALT 14; TSH 2.91 08/19/2020: Hemoglobin 10.6; Platelets 156 08/21/2020: BUN 32; Creatinine, Ser 1.22; Potassium 4.3; Sodium 138  Recent Lipid Panel    Component Value Date/Time   CHOL 156 07/17/2020 1042   TRIG 172.0 (H) 07/17/2020 1042   HDL 37.40 (L) 07/17/2020 1042  CHOLHDL 4 07/17/2020 1042   VLDL 34.4 07/17/2020 1042   LDLCALC 84 07/17/2020 1042      Physical Exam:    VS:  BP 117/62   Pulse 75   Ht _0  (1.727 m)   Wt 169 lb (76.7 kg)   SpO2 99%   BMI 25.70 kg/m     Wt Readings from Last 3 Encounters:  08/22/20 169 lb (76.7 kg)  08/12/20 169 lb (76.7 kg)  07/31/20 168 lb 6.4 oz (76.4 kg)     GEN:  Elderly male, sitting in a wheelchair, comfortable. Hard of hearing. HEENT: Normal NECK: No JVD; No carotid bruits CARDIAC: RRR, 2/6 systolic murmur best heard at RUSB RESPIRATORY:  Clear to auscultation without rales, wheezing or rhonchi  ABDOMEN: Soft, non-tender,  non-distended MUSCULOSKELETAL:  No edema; No deformity  SKIN: Warm and dry NEUROLOGIC:  Alert and oriented x 3 PSYCHIATRIC:  Normal affect   ASSESSMENT:    1. Coronary artery disease involving native heart with angina pectoris, unspecified vessel or lesion type ([place])   2. Primary hypertension   3. Sick sinus syndrome ([place])   4. Type 2 diabetes mellitus with hyperglycemia, unspecified whether long term insulin use ([place])   5. Chronic pulmonary embolism, unspecified pulmonary embolism type, unspecified whether acute cor pulmonale present ([name])   6. Cardiac pacemaker   7. Pure hypercholesterolemia   8. Cerebrovascular accident (CVA), unspecified mechanism ([place])   9. Right sided weakness   10. H/O coronary artery bypass surgery    PLAN:    In order of problems listed above:  #Multivessel CAD s/p CABG in 2001: Patient with history of multiple MI's in the past with initial occurring at age 71. Had several subsequent PCIs (thinks he has 10 stents) and then suffered MI in 2001 prompting bypass surgery. Has been doing well post-bypass with no further chest pain or SOB. He has not required PCI since his bypass surgery. Has been followed by Cardiology in [place], [place] prior to transferring care here.  -TTE with LVEF 60-65%, no WMA -Continue ASA 62m daily  -Continue rampipril 148mdaily -Continue crestor 2051maily -Continue coreg 12.5mg77mD  #HTN: Patient is on a number of anti-hypertensives that I am concerned about in an elderly male with history of falls. Has been having blood pressure swings at home. Goal is to titrate off clonidine and possibly chlorthalidone and maintain on BB, ACE/ARB +/- spiro given history of CAD. Would prefer amlo over chlorthalidone. Will do this with the assistance of HTN clinic. -Continue chlorthalidone 25mg19mly -Wean off clonidine -Continue coreg 12.5mg B54m-Continue ramipril 10mg d92m  #Suspected SSS s/p PPM placement: Medtronic PPM functioning  well. -Follow-up with EP as scheduled  #Episodic Right sided weakness: Patient with episodes of right sided weakness after sleeping on that side. Suspect secondary to nerve compression as improves with getting up and moving in the morning. MRI head without acute stroke. -Discussed trying to relieve pressure points to prevent nerve compression -Will be working with PT who may help  -MRI head negative for acute pathology  #HLD: -Continue crestor 20mg da52m #DMII on insulin: -Management per PCP  #History of PE on Warfarin: -Continue warfarin -Managed by PCP  #Prior CVA: -Continue crestor 20mg dai6mContinue warfarin -Continue ASA for now as above   Medication Adjustments/Labs and Tests Ordered: Current medicines are reviewed at length with the patient today.  Concerns regarding medicines are outlined above.  No orders of the defined types were placed in this  encounter.  No orders of the defined types were placed in this encounter.   Patient Instructions  Medication Instructions:  Your physician has recommended you make the following change in your medication:  1.  STOP the Clonidine    *If you need a refill on your cardiac medications before your next appointment, please call your pharmacy*   Lab Work: None ordered  If you have labs (blood work) drawn today and your tests are completely normal, you will receive your results only by: Marland Kitchen MyChart Message (if you have MyChart) OR . A paper copy in the mail If you have any lab test that is abnormal or we need to change your treatment, we will call you to review the results.   Testing/Procedures: None ordered    Follow-Up: At [facility], you and your health needs are our priority.  As part of our continuing mission to provide you with exceptional heart care, we have created designated [facility] Care Teams.  These Care Teams include your primary Cardiologist (physician) and Advanced Practice Providers (APPs -   Physician Assistants and Nurse Practitioners) who all work together to provide you with the care you need, when you need it.  We recommend signing up for the patient portal called "MyChart".  Sign up information is provided on this After Visit Summary.  MyChart is used to connect with patients for Virtual Visits (Telemedicine).  Patients are able to view lab/test results, encounter notes, upcoming appointments, etc.  Non-urgent messages can be sent to your Tierney Behl as well.   To learn more about what you can do with MyChart, go to [email or web address].    Your next appointment:   3 month(s)  The format for your next appointment:   In Person  Naia Ruff:   Gwyndolyn Kaufman, MD   Other Instructions      Signed, Freada Bergeron, MD  08/22/2020 11:06 AM    [place]

## 2020-08-19 NOTE — ED Provider Notes (Signed)
[facility] DEPT Provider Note   CSN: [id number] Arrival date & time: 08/19/20  0253     History Chief Complaint  Patient presents with  . Fall    Thomas [name] is a 87 y.o. 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  Patient notes he was getting up to use the restroom and his "right side gave out".  Patient states he felt off balance going from a sitting to standing position.  He denies preceding chest pain or shortness of breath.  He notes he fell directly on his left side.  Denies head injury and loss of consciousness.  He is currently on Coumadin.  Denies any pain.  Patient denies left hip pain, shoulder pain, neck pain, back pain. Denies any injuries.  Per EMS, patient found to be orthostatic.  Chart reviewed.  Patient was seen by GI on 2/15 due to persistent diarrhea for the past 6 to 7 months sssociated with weight loss.  Per GI note, patient has been having 2-3 loose stools daily.  Denies melena, hematochezia, and hematemesis.  Patient has a CT abdomen scheduled for today for further evaluation.  Patient notes concerns about possible colon cancer.  Patient admits to decreased oral intake over the past few days.  Denies fever and chills.  Denies cough, nasal congestion, and sore throat. Patient notes he had difficulties finding his words prior to his fall. Denies any speech and visual changes currently. Admits to right sided weakness from previous CVA?  Spoke to patient's wife, Thomas [name] on the phone and notes patient went to bed at 10 PM last night and woke up at 1:15 AM with slurred speech which improved after a little while. She states patient was complaining of right-sided weakness and numbness/tingling. Wife  denies any deficits from previous CVA.   History obtained from patient and past medical records. No interpreter used during encounter.       Past Medical History:  Diagnosis Date  . Arthritis   . Clotting disorder ([place])   . Coronary artery disease   . Diabetes ([place])   . Hyperlipidemia   . Hypertension   . Kidney disease   . Loss of hearing   . Macular degeneration   . Myocardial infarction ([place])   . Osteoarthritis   . Stroke [facility])     Patient Active Problem List   Diagnosis Date Noted  . Sick sinus syndrome ([place]) 07/31/2020  . Coronary artery disease s/p CABG 2001 07/17/2020  . Hypothyroidism 07/17/2020  . Venous thromboembolism (VTE) 07/17/2020  . Hypertension 07/17/2020  . Cardiac pacemaker 07/17/2020  . Osteoarthritis 07/17/2020  . Type 2 diabetes mellitus with hyperglycemia ([place]) 07/17/2020  . Vitamin D deficiency 07/17/2020  . Macular degeneration 07/17/2020  . Dyslipidemia due to type 2 diabetes mellitus ([place]) 07/17/2020  . Insomnia 07/17/2020  . BPH (benign prostatic hyperplasia) 07/17/2020  . Colon polyps 07/17/2020    Past Surgical History:  Procedure Laterality Date  . CARPAL TUNNEL RELEASE  2011  . CORONARY ANGIOPLASTY WITH STENT PLACEMENT    . CORONARY ARTERY BYPASS GRAFT  2001   x6  . REPLACEMENT TOTAL KNEE    . SPINE SURGERY  2008   cervical and lumbar  . THYROIDECTOMY  Family History  Problem Relation Age of Onset  . Stroke Mother   . Stroke Father   . Heart disease Sister   . Cancer Daughter   . Hypertension Son   . Heart disease Brother   . Cancer Brother   . Heart disease Brother     Social History   Tobacco Use  . Smoking status: Never Smoker  . Smokeless tobacco: Never Used  Substance Use Topics  . Alcohol use: Never  . Drug use: Never    Home Medications Prior to Admission medications   Medication Sig Start Date End Date Taking? Authorizing Provider  acetaminophen (TYLENOL) 325 MG tablet Take 650 mg by mouth  every 6 (six) hours as needed.    [provider]  aspirin 81 MG chewable tablet Chew 81 mg by mouth daily.    [provider]  b complex vitamins capsule Take 1 capsule by mouth daily.    [provider]  carvedilol (COREG) 12.5 MG tablet Take 1 tablet (12.5 mg total) by mouth 2 (two) times daily. 08/07/20 08/02/21  Freada Bergeron, MD  chlorthalidone (HYGROTON) 25 MG tablet TAKE 1 TABLET BY MOUTH EVERY DAY AFTER BREAKFAST FOR BLOOD PRESSURE 05/31/20   [provider]  cholecalciferol (VITAMIN D3) 25 MCG (1000 UNIT) tablet Take 1,000 Units by mouth daily.    [provider]  cloNIDine (CATAPRES) 0.1 MG tablet Take 0.1 mg by mouth daily. 03/24/20   [provider]  co-enzyme Q-10 50 MG capsule Take 50 mg by mouth daily.    [provider]  glucosamine-chondroitin 500-400 MG tablet Take 1 tablet by mouth 3 (three) times daily.    [provider]  insulin glargine (LANTUS) 100 unit/mL SOPN Inject 13 Units into the skin daily.    [provider]  JANTOVEN 4 MG tablet Take 4 mg by mouth daily. 07/09/20   [provider]  levothyroxine (SYNTHROID) 100 MCG tablet Take 1 tablet (100 mcg total) by mouth daily. 08/19/20   Vivi Barrack, MD  mirtazapine (REMERON) 7.5 MG tablet Take 7.5 mg by mouth at bedtime. 07/11/20   [provider]  Multiple Vitamin (MULTIVITAMIN) tablet Take 1 tablet by mouth daily.    [provider]  ramipril (ALTACE) 10 MG capsule Take 10 mg by mouth daily. 05/23/20   [provider]  Red Yeast Rice Extract (RED YEAST RICE PO) Take by mouth.    [provider]  rosuvastatin (CRESTOR) 20 MG tablet Take 1 tablet (20 mg total) by mouth daily. 07/21/20   Freada Bergeron, MD  tamsulosin (FLOMAX) 0.4 MG CAPS capsule SMARTSIG:1 Capsule(s) By Mouth Every Evening 07/11/20   [provider]  warfarin (COUMADIN) 3 MG tablet TAKE 1 TABLET BY MOUTH DAILY ON  TUESDAY, THURSDAY AND SATURDAY 07/23/20   Vivi Barrack, MD    Allergies    Amitriptyline, Effexor xr [venlafaxine], Lyrica [pregabalin], Morphine, Morphine and related, Pravachol [pravastatin], and Zolpidem  Review of Systems   Review of Systems  Constitutional: Negative for chills and fever.  Respiratory: Negative for shortness of breath.   Cardiovascular: Negative for chest pain.  Gastrointestinal: Positive for diarrhea (persistent diarrhea). Negative for abdominal pain, nausea and vomiting.  Musculoskeletal: Negative for arthralgias and back pain.  Neurological: Positive for dizziness (upon standing) and speech difficulty (resolved). Negative for syncope, facial asymmetry, numbness and headaches.  All other systems reviewed and are negative.   Physical Exam Updated Vital Signs BP (!) 160/80  Pulse 68   Temp 98.4 F (36.9 C) (Oral)   Resp 19   SpO2 100%   Physical Exam Vitals and nursing note reviewed.  Constitutional:      General: He is not in acute distress.    Appearance: He is not ill-appearing.  HENT:     Head: Normocephalic.  Eyes:     Extraocular Movements: Extraocular movements intact.     Pupils: Pupils are equal, round, and reactive to light.  Neck:     Comments: No cervical midline tenderness Cardiovascular:     Rate and Rhythm: Normal rate and regular rhythm.     Pulses: Normal pulses.     Heart sounds: Normal heart sounds. No murmur heard. No friction rub. No gallop.   Pulmonary:     Effort: Pulmonary effort is normal.     Breath sounds: Normal breath sounds.  Abdominal:     General: Abdomen is flat. There is no distension.     Palpations: Abdomen is soft.     Tenderness: There is no abdominal tenderness. There is no guarding or rebound.     Comments: Abdomen soft, nondistended, nontender to palpation in all quadrants without guarding or peritoneal signs. No rebound.   Musculoskeletal:     Cervical back: Neck supple.     Comments: No thoracic or  lumbar midline tenderness.  No bony tenderness at left hip.  Full range of motion of left hip.  Skin:    General: Skin is warm and dry.  Neurological:     General: No focal deficit present.     Mental Status: He is alert.     Comments: Speech is clear, able to follow commands CN III-XII intact Normal strength in upper and lower extremities bilaterally including dorsiflexion and plantar flexion, strong and equal grip strength Sensation grossly intact throughout Moves extremities without ataxia, coordination intact No pronator drift  Psychiatric:        Mood and Affect: Mood normal.        Behavior: Behavior normal.     ED Results / Procedures / Treatments   Labs (all labs ordered are listed, but only abnormal results are displayed) Labs Reviewed  CBC WITH DIFFERENTIAL/PLATELET - Abnormal; Notable for the following components:      Result Value   WBC 13.4 (*)    RBC 3.26 (*)    Hemoglobin 10.6 (*)    HCT 32.3 (*)    Neutro Abs 10.7 (*)    All other components within normal limits  BASIC METABOLIC PANEL - Abnormal; Notable for the following components:   CO2 18 (*)    Glucose, Bld 277 (*)    BUN 51 (*)    Calcium 8.5 (*)    GFR, Estimated 58 (*)    All other components within normal limits  MULTIPLE MYELOMA PANEL, SERUM    EKG None  Radiology CT Head Wo Contrast  Result Date: 08/19/2020 CLINICAL DATA:  Pain following recent fall EXAM: CT HEAD WITHOUT CONTRAST TECHNIQUE: Contiguous axial images were obtained from the base of the skull through the vertex without intravenous contrast. COMPARISON:  None. FINDINGS: Brain: There is mild diffuse atrophy. There is no intracranial mass, hemorrhage, extra-axial fluid collection, or midline shift. There is decreased attenuation throughout the centra semiovale bilaterally. There is evidence of prior small infarcts in each thalamus. Decreased attenuation is noted in the anterior limb of the left external capsule. No acute appearing  infarct is appreciable. Vascular: No hyperdense vessels. There is calcification  in each distal vertebral artery and carotid siphon region. Skull: Bony calvarium appears intact. Sinuses/Orbits: There is a retention cyst in the inferior right maxillary antrum. There is mucosal thickening in several ethmoid air cells. Orbits appear symmetric bilaterally. Other: Mastoid air cells are clear. IMPRESSION: Atrophy with periventricular small vessel disease. Prior small infarcts in each thalamus as well as in the anterior limb of the left external capsule. No acute infarct is evident. No mass or hemorrhage. There are foci of arterial vascular calcification. There are foci of paranasal sinus disease. Electronically Signed   By: Lowella Grip III M.D.   On: 08/19/2020 09:39   MR BRAIN WO CONTRAST  Result Date: 08/19/2020 CLINICAL DATA:  Transient ischemic attack. Fall at home. Orthostatic. EXAM: MRI HEAD WITHOUT CONTRAST TECHNIQUE: Multiplanar, multiecho pulse sequences of the brain and surrounding structures were obtained without intravenous contrast. COMPARISON:  Same day head CT. FINDINGS: Brain: No evidence of acute infarct. Remote infarct in the lateral right temporal lobe. Additional remote lacunar infarcts involving the bilateral thalami and bilateral cerebellar hemispheres. Additional small T2 hyperintensities in the basal ganglia most likely represent dilated perivascular spaces. There is advanced/confluent T2/FLAIR hyperintensities within the white matter and pons, most likely related to chronic microvascular ischemic disease. Punctate area of susceptibility artifact in the right cerebellar hemisphere, likely related to prior microhemorrhage. Mild generalized cerebral volume loss. No hydrocephalus. Cavum septum pellucidum et vergae, anatomic variant. No acute hemorrhage. No mass lesion or abnormal mass effect. No extra-axial fluid collection. Vascular: Major arterial flow voids are maintained at the skull  base. Skull and upper cervical spine: Normal marrow signal. Partially imaged upper cervical spine degenerative change. Sinuses/Orbits: Right maxillary sinus retention cyst versus polyp. Scattered ethmoid air cell mucosal thickening without air-fluid levels. Unremarkable orbits. Other: No sizable mastoid effusions. IMPRESSION: 1. No evidence of acute intracranial abnormality.  No acute infarct. 2. Remote right temporal lobe and bilateral basal ganglia and cerebellar infarcts. 3. Advanced chronic microvascular ischemic disease. Electronically Signed   By: Margaretha Sheffield MD   On: 08/19/2020 15:16   CT ABDOMEN PELVIS W CONTRAST  Result Date: 08/19/2020 CLINICAL DATA:  Fall. EXAM: CT ABDOMEN AND PELVIS WITH CONTRAST TECHNIQUE: Multidetector CT imaging of the abdomen and pelvis was performed using the standard protocol following bolus administration of intravenous contrast. CONTRAST:  118m OMNIPAQUE IOHEXOL 300 MG/ML  SOLN COMPARISON:  None. FINDINGS: Lower chest: Visualized lung bases are unremarkable. Moderate size sliding-type hiatal hernia is noted. Hepatobiliary: No focal liver abnormality is seen. No gallstones, gallbladder wall thickening, or biliary dilatation. Pancreas: Unremarkable. No pancreatic ductal dilatation or surrounding inflammatory changes. Spleen: Normal in size without focal abnormality. Adrenals/Urinary Tract: Adrenal glands are unremarkable. Kidneys are normal, without renal calculi, focal lesion, or hydronephrosis. Bladder is unremarkable. Stomach/Bowel: There is no evidence of bowel obstruction or inflammation. Diverticulosis of descending and sigmoid colon is noted without inflammation. The appendix is not visualized. Vascular/Lymphatic: Aortic atherosclerosis. No enlarged abdominal or pelvic lymph nodes. Reproductive: Prostate is unremarkable. Other: Small fat containing periumbilical hernia is noted. No ascites is noted. Musculoskeletal: Multiple small lucencies are noted throughout  the pelvis and visualized proximal femurs concerning for possible lytic lesions related to multiple myeloma or metastatic disease. IMPRESSION: 1. Moderate size sliding-type hiatal hernia. 2. Diverticulosis of descending and sigmoid colon is noted without inflammation. 3. Small fat containing periumbilical hernia. 4. Multiple small lucencies are noted throughout the pelvis and visualized proximal femurs concerning for possible lytic lesions related to multiple myeloma or metastatic disease.  MRI may be performed for further evaluation. Aortic Atherosclerosis (ICD10-I70.0). Electronically Signed   By: Marijo Conception M.D.   On: 08/19/2020 09:42   DG Chest Port 1 View  Result Date: 08/19/2020 CLINICAL DATA:  Pacemaker. EXAM: PORTABLE CHEST 1 VIEW COMPARISON:  None. FINDINGS: The heart size and mediastinal contours are within normal limits. Both lungs are clear. Status post coronary bypass graft. Left-sided pacemaker is noted with leads in grossly good position. No pneumothorax or pleural effusion is noted. Old left rib fractures are noted. IMPRESSION: No active disease. Electronically Signed   By: Marijo Conception M.D.   On: 08/19/2020 13:56    Procedures Procedures   Medications Ordered in ED Medications  sodium chloride 0.9 % bolus 1,000 mL (0 mLs Intravenous Stopped 08/19/20 1219)  iohexol (OMNIPAQUE) 300 MG/ML solution 100 mL (100 mLs Intravenous Contrast Given 08/19/20 0901)  sodium chloride 0.9 % bolus 1,000 mL (1,000 mLs Intravenous New Bag/Given 08/19/20 1219)    ED Course  I have reviewed the triage vital signs and the nursing notes.  Pertinent labs & imaging results that were available during my care of the patient were reviewed by me and considered in my medical decision making (see chart for details).  Clinical Course as of 08/19/20 1536  Tue Aug 19, 2020  1013 Spoke to patient's wife Thomas [name] on the phone to get model number of pacemaker.  Thomas [name] Number is [id number].   If Azure detects  changes in your heart, it wirelessly and securely transfers your heart device information to your clinic. Azure pacemaker is safe in the MRI environment when specific conditions are met, and offers exclusive algorithms to accurately detect and reduce the likelihood of atrial fibrillation.  Model numbers: [id number], [id number], [id number] [CX]  4481 Spoke to MRI tech who notes patient will need to have MRI at [facility] due to pacemaker.  [CA]    Clinical Course User Index [CA] Suzy Bouchard, PA-C   MDM Rules/Calculators/A&P                         87 year old male presents to the ED via EMS after a fall that occurred just prior to arrival.  Patient states he was standing to go the bathroom and fell directly on his left side.  Denies head injury and loss of consciousness.  Patient notes he was having difficulties finding his words which has completely resolved.  Patient is being followed by GI due to persistent diarrhea for the past 6 to 7 months.  Upon EMS arrival, patient found to have orthostatic hypotension likely due to chronic diarrhea and decreased p.o. intake.  Upon arrival, vitals all within normal limits.  Patient is afebrile, not tachycardic or hypoxic.  Patient in no acute distress and nontoxic-appearing.  Physical exam reassuring.  Normal neurological exam.  No bony tenderness at left hip or upper extremity. No cervical, thoracic, or lumbar midline tenderness. Routine labs ordered to rule out electrolyte abnormalities given chronic diarrhea.  CT head due to possible changes to speech.  CT abdomen pelvis ordered given patient has one scheduled today per GI due to persistent diarrhea.  IV fluids given for orthostatic hypotension.  Suspect symptoms related to orthostatic hypotension secondary to dehydration from chronic diarrhea and decreased p.o. intake. Normal neurological exam. Low suspicion for acute CVA given no neurological deficits on exam.  Discussed case with Dr. Gilford Raid and who evaluated  patient at bedside and agrees with assessment and plan.  Pacemaker interrogated with no overnight events.  Last episode was 2/14.  Pacemaker working appropriately.  CBC significant for mild leukocytosis at 13.4 with mild anemia with hemoglobin at 10.6.  BMP significant for hyperglycemia 277 with no anion gap.  Doubt DKA.  Bicarb 18.  BUN 51 with normal creatinine. CT head personally reviewed which demonstrates:   IMPRESSION:  Atrophy with periventricular small vessel disease. Prior small  infarcts in each thalamus as well as in the anterior limb of the  left external capsule. No acute infarct is evident. No mass or  hemorrhage.    There are foci of arterial vascular calcification. There are foci of  paranasal sinus disease.   CT abdomen personally reviewed which demonstrates: IMPRESSION:  1. Moderate size sliding-type hiatal hernia.  2. Diverticulosis of descending and sigmoid colon is noted without  inflammation.  3. Small fat containing periumbilical hernia.  4. Multiple small lucencies are noted throughout the pelvis and  visualized proximal femurs concerning for possible lytic lesions  related to multiple myeloma or metastatic disease. MRI may be  performed for further evaluation.     Multiple myeloma panel ordered.  Message to patient's PCP to ensure close follow-up in regards to CT abdomen results.  Discussed results with patient.  MRI personally reviewed which demonstrates: IMPRESSION:  1. No evidence of acute intracranial abnormality. No acute infarct.  2. Remote right temporal lobe and bilateral basal ganglia and  cerebellar infarcts.  3. Advanced chronic microvascular ischemic disease.   Patient handed off to Krista Blue, PA-C at shift change pending return to [place] from MRI.  Upon return, recheck orthostatics. If normal, patient may be discharged home with PCP follow-up in regards to CT abdomen findings.  Final Clinical Impression(s) / ED Diagnoses Final  diagnoses:  Fall, initial encounter  Diarrhea, unspecified type    Rx / DC Orders ED Discharge Orders    None       Karie Kirks 08/19/20 1536    Isla Pence, MD 08/20/20 [phone]

## 2020-08-19 NOTE — Telephone Encounter (Signed)
Ok with me. Please place any necessary orders. 

## 2020-08-19 NOTE — Telephone Encounter (Signed)
Patient requesting Refill Rx Synthroid  Done by historical provider

## 2020-08-19 NOTE — Consult Note (Signed)
[employer] Azure Model [id number]   S# [id number] Thomas [name] [id number]  [id number] V LEAD 2 [id number]  C#[id number] V  Per Stacy at [facility] pt has a MRI complatable pacemaker and leads complete set. System will need to be placed into surescan mode by rep or handheld device.

## 2020-08-21 ENCOUNTER — Ambulatory Visit (INDEPENDENT_AMBULATORY_CARE_PROVIDER_SITE_OTHER): Payer: Medicare Other | Admitting: Family Medicine

## 2020-08-21 ENCOUNTER — Telehealth: Payer: Self-pay | Admitting: Hematology and Oncology

## 2020-08-21 ENCOUNTER — Encounter: Payer: Self-pay | Admitting: Family Medicine

## 2020-08-21 ENCOUNTER — Other Ambulatory Visit: Payer: Self-pay

## 2020-08-21 VITALS — BP 100/63 | HR 75 | Temp 97.3°F | Ht 68.5 in

## 2020-08-21 DIAGNOSIS — I10 Essential (primary) hypertension: Secondary | ICD-10-CM

## 2020-08-21 DIAGNOSIS — R5381 Other malaise: Secondary | ICD-10-CM

## 2020-08-21 DIAGNOSIS — M899 Disorder of bone, unspecified: Secondary | ICD-10-CM | POA: Diagnosis not present

## 2020-08-21 DIAGNOSIS — I495 Sick sinus syndrome: Secondary | ICD-10-CM

## 2020-08-21 DIAGNOSIS — W19XXXD Unspecified fall, subsequent encounter: Secondary | ICD-10-CM

## 2020-08-21 LAB — MULTIPLE MYELOMA PANEL, SERUM
Albumin SerPl Elph-Mcnc: 3.2 g/dL (ref 2.9–4.4)
Albumin/Glob SerPl: 1.1 (ref 0.7–1.7)
Alpha 1: 0.2 g/dL (ref 0.0–0.4)
Alpha2 Glob SerPl Elph-Mcnc: 0.8 g/dL (ref 0.4–1.0)
B-Globulin SerPl Elph-Mcnc: 0.9 g/dL (ref 0.7–1.3)
Gamma Glob SerPl Elph-Mcnc: 1.1 g/dL (ref 0.4–1.8)
Globulin, Total: 3 g/dL (ref 2.2–3.9)
IgA: 223 mg/dL (ref 61–437)
IgG (Immunoglobin G), Serum: 1024 mg/dL (ref 603–1613)
IgM (Immunoglobulin M), Srm: 155 mg/dL — ABNORMAL HIGH (ref 15–143)
Total Protein ELP: 6.2 g/dL (ref 6.0–8.5)

## 2020-08-21 LAB — BASIC METABOLIC PANEL
BUN: 32 mg/dL — ABNORMAL HIGH (ref 6–23)
CO2: 25 mEq/L (ref 19–32)
Calcium: 8.7 mg/dL (ref 8.4–10.5)
Chloride: 104 mEq/L (ref 96–112)
Creatinine, Ser: 1.22 mg/dL (ref 0.40–1.50)
GFR: 52.56 mL/min — ABNORMAL LOW (ref >60.00)
Glucose, Bld: 176 mg/dL — ABNORMAL HIGH (ref 70–99)
Potassium: 4.3 mEq/L (ref 3.5–5.1)
Sodium: 138 mEq/L (ref 135–145)

## 2020-08-21 LAB — PSA: PSA: 0.34 ng/mL (ref 0.10–4.00)

## 2020-08-21 NOTE — Patient Instructions (Addendum)
It was very nice to see you today!  We will check blood work today.  Please make sure you are getting plenty of fluids.   I will place a referral to the oncologist and for home health.   Take care, Dr [name]  Please try these tips to maintain a healthy lifestyle:   Eat at least 3 REAL meals and 1-2 snacks per day.  Aim for no more than 5 hours between eating.  If you eat breakfast, please do so within one hour of getting up.    Each meal should contain half fruits/vegetables, one quarter protein, and one quarter carbs (no bigger than a computer mouse)   Cut down on sweet beverages. This includes juice, soda, and sweet tea.     Drink at least 1 glass of water with each meal and aim for at least 8 glasses per day   Exercise at least 150 minutes every week.

## 2020-08-21 NOTE — Assessment & Plan Note (Signed)
Will place referral to home health for PT and home health aide.

## 2020-08-21 NOTE — Progress Notes (Signed)
° °  Thomas [name] is a 87 y.o. male who presents today for an office visit.  Assessment/Plan:  New/Acute Problems: Fall Likely secondary to orthostasis.  He will be following up with cardiology soon and will likely be decreasing BP medications.  Encouraged good hydration.  Will place referral for home health PT.  Chronic Problems Addressed Today: Lytic bone lesion of femur Multiple myeloma panel is pending.  Recently had CT abdomen pelvis without any other signs of cancer or metastatic illness.  Will check PSA today and place referral to oncology.  Debility Will place referral to home health for PT and home health aide.  Sick sinus syndrome [facility]) Following with cardiology.  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  Hypertension On the low side.  Will be following up with cardiology soon to adjust medications.  We will likely stop clonidine soon.  We will recheck bmet today.     Subjective:  HPI:  Patient here for ED follow-up. Went to the ED 2 days ago after sustaining fall. He was standing up to go to the bathroom and fell directly on his left side. Did not lose consciousness. No known injury. Did have some word finding difficulty which resolved on arrival to the ED.  There was concern for dehydration and orthostatic hypotension due to issues with chronic diarrhea and decreased p.o. intake. Had normal vitals on arrival to the emergency room. His pacemaker was interrogated with no events. Underwent brain MRI which showed no acute findings. He did up having a CT abdomen and pelvis which showed lytic lesions concerning for possible multiple myeloma or metastatic disease.  In the ED had pacemaker interrogation which was normal. Also had CT abdomen pelvis findings which were concerning for possible lytic lesions possibly related to multiple myeloma or metastatic disease.  He has been home for the last couple of days and has done well.  Has not had  any further episodes of diarrhea.  They are interested in possible physical therapy.  No further falls.  No other weakness or numbness or other changes in overall health.       Objective:  Physical Exam: BP 100/63    Pulse 75    Temp (!) 97.3 F (36.3 C) (Temporal)    Ht 5' 8.5" (1.74 m)    SpO2 97%    BMI 25.32 kg/m   Gen: No acute distress, resting comfortably CV: Regular rate and rhythm with no murmurs appreciated Pulm: Normal work of breathing, clear to auscultation bilaterally with no crackles, wheezes, or rhonchi Neuro: Grossly normal, moves all extremities Psych: Normal affect and thought content  Time Spent: 45 minutes of total time was spent on the date of the encounter performing the following actions: chart review prior to seeing the patient including recent Ed visit, obtaining history, performing a medically necessary exam, counseling on the treatment plan, placing orders, and documenting in our EHR.        [name]. [name], MD 08/21/2020 11:44 AM

## 2020-08-21 NOTE — Addendum Note (Signed)
Addended by: Lurlean Horns on: 08/21/2020 12:59 PM   Modules accepted: Orders

## 2020-08-21 NOTE — Addendum Note (Signed)
Addended by: Lurlean Horns on: 08/21/2020 11:54 AM   Modules accepted: Orders

## 2020-08-21 NOTE — Assessment & Plan Note (Signed)
On the low side.  Will be following up with cardiology soon to adjust medications.  We will likely stop clonidine soon.  We will recheck bmet today.

## 2020-08-21 NOTE — Telephone Encounter (Signed)
Received a new pt referral from Dr. [name] for lytic bone lesion of femur. Thomas [name] has been scheduled to see Thomas [name] on 3/2 at 2pm. Appt date ad time has been given to the pt's wife. Aware to arrive 20 minutes early.

## 2020-08-21 NOTE — Assessment & Plan Note (Signed)
Multiple myeloma panel is pending.  Recently had CT abdomen pelvis without any other signs of cancer or metastatic illness.  Will check PSA today and place referral to oncology.

## 2020-08-21 NOTE — Assessment & Plan Note (Signed)
Following with cardiology.  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

## 2020-08-22 ENCOUNTER — Ambulatory Visit: Payer: Medicare Other | Admitting: Cardiology

## 2020-08-22 ENCOUNTER — Encounter: Payer: Self-pay | Admitting: Cardiology

## 2020-08-22 VITALS — BP 117/62 | HR 75 | Ht 68.0 in | Wt 169.0 lb

## 2020-08-22 DIAGNOSIS — I495 Sick sinus syndrome: Secondary | ICD-10-CM | POA: Diagnosis not present

## 2020-08-22 DIAGNOSIS — E1165 Type 2 diabetes mellitus with hyperglycemia: Secondary | ICD-10-CM | POA: Diagnosis not present

## 2020-08-22 DIAGNOSIS — E7800 Pure hypercholesterolemia, unspecified: Secondary | ICD-10-CM

## 2020-08-22 DIAGNOSIS — I10 Essential (primary) hypertension: Secondary | ICD-10-CM | POA: Diagnosis not present

## 2020-08-22 DIAGNOSIS — Z950 Presence of cardiac pacemaker: Secondary | ICD-10-CM

## 2020-08-22 DIAGNOSIS — R5381 Other malaise: Secondary | ICD-10-CM | POA: Diagnosis not present

## 2020-08-22 DIAGNOSIS — I25119 Atherosclerotic heart disease of native coronary artery with unspecified angina pectoris: Secondary | ICD-10-CM | POA: Diagnosis not present

## 2020-08-22 DIAGNOSIS — I639 Cerebral infarction, unspecified: Secondary | ICD-10-CM

## 2020-08-22 DIAGNOSIS — R531 Weakness: Secondary | ICD-10-CM

## 2020-08-22 DIAGNOSIS — I2782 Chronic pulmonary embolism: Secondary | ICD-10-CM

## 2020-08-22 DIAGNOSIS — Z951 Presence of aortocoronary bypass graft: Secondary | ICD-10-CM

## 2020-08-22 LAB — URINALYSIS, ROUTINE W REFLEX MICROSCOPIC
Bilirubin Urine: NEGATIVE
Hgb urine dipstick: NEGATIVE
Ketones, ur: NEGATIVE
Leukocytes,Ua: NEGATIVE
Nitrite: NEGATIVE
Specific Gravity, Urine: 1.01 (ref 1.000–1.030)
Total Protein, Urine: NEGATIVE
Urine Glucose: 100 — AB
Urobilinogen, UA: 0.2 (ref 0.0–1.0)
pH: 5.5 (ref 5.0–8.0)

## 2020-08-22 NOTE — Addendum Note (Signed)
Addended by: Cleda Mccreedy F on: 08/22/2020 11:22 AM   Modules accepted: Orders

## 2020-08-22 NOTE — Progress Notes (Signed)
Please inform patient of the following:  PSA level is normal. Electrolytes and kidney function are all stable. Do not need to make any changes to his treatment plan at this time. Would like for him to follow up with oncology as we discussed.  Katina Degree. [name], MD 08/22/2020 8:35 AM

## 2020-08-22 NOTE — Patient Instructions (Addendum)
Medication Instructions:  Your physician has recommended you make the following change in your medication:  1.  STOP the Clonidine    *If you need a refill on your cardiac medications before your next appointment, please call your pharmacy*   Lab Work: None ordered  If you have labs (blood work) drawn today and your tests are completely normal, you will receive your results only by: Marland Kitchen MyChart Message (if you have MyChart) OR . A paper copy in the mail If you have any lab test that is abnormal or we need to change your treatment, we will call you to review the results.   Testing/Procedures: None ordered    Follow-Up: At [facility], you and your health needs are our priority.  As part of our continuing mission to provide you with exceptional heart care, we have created designated Provider Care Teams.  These Care Teams include your primary Cardiologist (physician) and Advanced Practice Providers (APPs -  Physician Assistants and Nurse Practitioners) who all work together to provide you with the care you need, when you need it.  We recommend signing up for the patient portal called "MyChart".  Sign up information is provided on this After Visit Summary.  MyChart is used to connect with patients for Virtual Visits (Telemedicine).  Patients are able to view lab/test results, encounter notes, upcoming appointments, etc.  Non-urgent messages can be sent to your provider as well.   To learn more about what you can do with MyChart, go to [email or web address].    Your next appointment:   3 month(s)  The format for your next appointment:   In Person  Provider:   Laurance Flatten, MD   Other Instructions

## 2020-08-24 LAB — URINE CULTURE
MICRO NUMBER:: 11580054
SPECIMEN QUALITY:: ADEQUATE

## 2020-08-25 ENCOUNTER — Other Ambulatory Visit: Payer: Self-pay

## 2020-08-25 MED ORDER — CEPHALEXIN 500 MG PO CAPS: 500.0000 mg | ORAL_CAPSULE | Freq: Two times a day (BID) | ORAL | 0 refills | Status: DC

## 2020-08-25 NOTE — Progress Notes (Signed)
Please inform patient of the following:  Urine culture shows he has a UTI. Recommend we start keflex 500mg  twice daily x 7 days. Please send in for patient. Do not need to do any follow up testing.  . Katina Degree, MD 08/25/2020 11:24 AM

## 2020-08-26 LAB — FECAL LACTOFERRIN, QUANT
Fecal Lactoferrin: NEGATIVE
MICRO NUMBER:: 11558527
SPECIMEN QUALITY:: ADEQUATE

## 2020-08-26 LAB — PANCREATIC ELASTASE, FECAL: Pancreatic Elastase-1, Stool: 224 mcg/g

## 2020-08-26 LAB — OVA AND PARASITE EXAMINATION
CONCENTRATE RESULT:: NONE SEEN
MICRO NUMBER:: 11558592
SPECIMEN QUALITY:: ADEQUATE
TRICHROME RESULT:: NONE SEEN

## 2020-08-27 ENCOUNTER — Inpatient Hospital Stay: Payer: Medicare Other

## 2020-08-27 ENCOUNTER — Inpatient Hospital Stay: Payer: Medicare Other | Attending: Hematology and Oncology | Admitting: Hematology and Oncology

## 2020-08-27 ENCOUNTER — Other Ambulatory Visit: Payer: Self-pay

## 2020-08-27 ENCOUNTER — Telehealth: Payer: Self-pay

## 2020-08-27 VITALS — BP 128/71 | HR 78 | Temp 97.3°F | Resp 20 | Ht 68.0 in | Wt 171.4 lb

## 2020-08-27 DIAGNOSIS — Z86718 Personal history of other venous thrombosis and embolism: Secondary | ICD-10-CM | POA: Insufficient documentation

## 2020-08-27 DIAGNOSIS — I252 Old myocardial infarction: Secondary | ICD-10-CM | POA: Diagnosis not present

## 2020-08-27 DIAGNOSIS — Z79899 Other long term (current) drug therapy: Secondary | ICD-10-CM | POA: Diagnosis not present

## 2020-08-27 DIAGNOSIS — E119 Type 2 diabetes mellitus without complications: Secondary | ICD-10-CM | POA: Insufficient documentation

## 2020-08-27 DIAGNOSIS — Z9049 Acquired absence of other specified parts of digestive tract: Secondary | ICD-10-CM | POA: Insufficient documentation

## 2020-08-27 DIAGNOSIS — M898X8 Other specified disorders of bone, other site: Secondary | ICD-10-CM | POA: Insufficient documentation

## 2020-08-27 DIAGNOSIS — Z7982 Long term (current) use of aspirin: Secondary | ICD-10-CM | POA: Diagnosis not present

## 2020-08-27 DIAGNOSIS — Z86711 Personal history of pulmonary embolism: Secondary | ICD-10-CM | POA: Diagnosis not present

## 2020-08-27 DIAGNOSIS — M898X5 Other specified disorders of bone, thigh: Secondary | ICD-10-CM

## 2020-08-27 DIAGNOSIS — E785 Hyperlipidemia, unspecified: Secondary | ICD-10-CM | POA: Diagnosis not present

## 2020-08-27 DIAGNOSIS — Z7901 Long term (current) use of anticoagulants: Secondary | ICD-10-CM | POA: Insufficient documentation

## 2020-08-27 DIAGNOSIS — Z8673 Personal history of transient ischemic attack (TIA), and cerebral infarction without residual deficits: Secondary | ICD-10-CM | POA: Insufficient documentation

## 2020-08-27 DIAGNOSIS — E039 Hypothyroidism, unspecified: Secondary | ICD-10-CM | POA: Insufficient documentation

## 2020-08-27 DIAGNOSIS — I119 Hypertensive heart disease without heart failure: Secondary | ICD-10-CM | POA: Insufficient documentation

## 2020-08-27 DIAGNOSIS — Z794 Long term (current) use of insulin: Secondary | ICD-10-CM | POA: Diagnosis not present

## 2020-08-27 LAB — CBC WITH DIFFERENTIAL (CANCER CENTER ONLY)
Abs Immature Granulocytes: 0.05 10*3/uL (ref 0.00–0.07)
Basophils Absolute: 0 10*3/uL (ref 0.0–0.1)
Basophils Relative: 0 %
Eosinophils Absolute: 0.4 10*3/uL (ref 0.0–0.5)
Eosinophils Relative: 3 %
HCT: 33.6 % — ABNORMAL LOW (ref 39.0–52.0)
Hemoglobin: 11.1 g/dL — ABNORMAL LOW (ref 13.0–17.0)
Immature Granulocytes: 1 %
Lymphocytes Relative: 33 %
Lymphs Abs: 3.6 10*3/uL (ref 0.7–4.0)
MCH: 32.1 pg (ref 26.0–34.0)
MCHC: 33 g/dL (ref 30.0–36.0)
MCV: 97.1 fL (ref 80.0–100.0)
Monocytes Absolute: 1 10*3/uL (ref 0.1–1.0)
Monocytes Relative: 9 %
Neutro Abs: 5.8 10*3/uL (ref 1.7–7.7)
Neutrophils Relative %: 54 %
Platelet Count: 213 10*3/uL (ref 150–400)
RBC: 3.46 MIL/uL — ABNORMAL LOW (ref 4.22–5.81)
RDW: 13 % (ref 11.5–15.5)
WBC Count: 10.8 10*3/uL — ABNORMAL HIGH (ref 4.0–10.5)
nRBC: 0 % (ref 0.0–0.2)

## 2020-08-27 LAB — CMP (CANCER CENTER ONLY)
ALT: 29 U/L (ref 0–44)
AST: 25 U/L (ref 15–41)
Albumin: 3.8 g/dL (ref 3.5–5.0)
Alkaline Phosphatase: 56 U/L (ref 38–126)
Anion gap: 8 (ref 5–15)
BUN: 23 mg/dL (ref 8–23)
CO2: 20 mmol/L — ABNORMAL LOW (ref 22–32)
Calcium: 8.8 mg/dL — ABNORMAL LOW (ref 8.9–10.3)
Chloride: 108 mmol/L (ref 98–111)
Creatinine: 1.16 mg/dL (ref 0.61–1.24)
GFR, Estimated: >60 mL/min (ref >60)
Glucose, Bld: 149 mg/dL — ABNORMAL HIGH (ref 70–99)
Potassium: 4.7 mmol/L (ref 3.5–5.1)
Sodium: 136 mmol/L (ref 135–145)
Total Bilirubin: 0.2 mg/dL — ABNORMAL LOW (ref 0.3–1.2)
Total Protein: 7.6 g/dL (ref 6.5–8.1)

## 2020-08-27 LAB — RETIC PANEL
Immature Retic Fract: 15 % (ref 2.3–15.9)
RBC.: 3.35 MIL/uL — ABNORMAL LOW (ref 4.22–5.81)
Retic Count, Absolute: 63.7 10*3/uL (ref 19.0–186.0)
Retic Ct Pct: 1.9 % (ref 0.4–3.1)
Reticulocyte Hemoglobin: 36.5 pg (ref >27.9)

## 2020-08-27 LAB — LACTATE DEHYDROGENASE: LDH: 206 U/L — ABNORMAL HIGH (ref 98–192)

## 2020-08-27 LAB — FOLATE: Folate: 82.7 ng/mL (ref >5.9)

## 2020-08-27 NOTE — Telephone Encounter (Signed)
..   LAST APPOINTMENT DATE: 08/25/2020   NEXT APPOINTMENT DATE:@4 /22/2022  MEDICATION:insulin glargine (LANTUS) 100 unit/mL SOPN    PHARMACY:[id number] - [place], [place] - [address]

## 2020-08-27 NOTE — Telephone Encounter (Signed)
Ok to refill? Order by historical provider

## 2020-08-27 NOTE — Progress Notes (Signed)
[place] [phone]   [phone]  INITIAL CONSULT NOTE  Patient Care Team: Vivi Barrack, MD as PCP - General (Family Medicine)  Hematological/Oncological History # Lytic Lesions of Pelvis/Femur 08/19/2020: patient presented to the ED s/p fall and was found to have multiple small lucencies are noted throughout the pelvis and visualized proximal femurs concerning for possible lytic lesions  CHIEF COMPLAINTS/PURPOSE OF CONSULTATION:  Possible lytic lesions in pelvis and proximal femurs  HISTORY OF PRESENTING ILLNESS:  Thomas [name] 86 y.o. male with medical history significant for HLD, DMII, prior CVA, HTN, CAD s/p CABG in 2001,SSS s/p PPM, pulmonary embolismon warfarin and hypothyroidism.   On review of the previous records, Thomas [name] presented to the emergency room on 08/19/20 after sustaining a fall likely secondary to orthostatic hypotension. CT imaging was obtained that revealed multiple small lucencies are noted throughout the pelvis and visualized proximal femurs concerning for possible lytic lesions.   On exam today, he reports that his energy levels are stable if not better after increasing his oral intake of fluids. In addition, he is has been more careful when changing positions. He uses a walker/cane to ambulate at home. He has a good appetite although he endorses approximately 20 pound weight loss over the last year. He contributes the weight loss to persistent diarrhea that has been present since August 2021. He denies watery stools but has frequent bowel movements throughout the day. He denies any hematochezia but does report melena with most recent episode yesterday. He reports history of partial colon resection in the 1960's due to "bleeding polyps". He had routine colonoscopies every 5 years until 10 years ago due to age and increased risk of tears. He is under the care of [facility] with next follow up next Tuesday on  09/02/20.   Thomas [name] denies any fevers, chills, night sweats, nausea, vomiting, abdominal pain, hip/back pain, shortness of breath, chest pain, cough or other skin changes. He has no other complaint. Rest of the 10 point review of systems is negative.   MEDICAL HISTORY:  Past Medical History:  Diagnosis Date  . Arthritis   . Clotting disorder ([place])   . Coronary artery disease   . Diabetes ([place])   . Hyperlipidemia   . Hypertension   . Kidney disease   . Loss of hearing   . Macular degeneration   . Myocardial infarction ([place])   . Osteoarthritis   . Stroke [facility])     SURGICAL HISTORY: Past Surgical History:  Procedure Laterality Date  . CARPAL TUNNEL RELEASE  2011  . CORONARY ANGIOPLASTY WITH STENT PLACEMENT    . CORONARY ARTERY BYPASS GRAFT  2001   x6  . REPLACEMENT TOTAL KNEE    . SPINE SURGERY  2008   cervical and lumbar  . THYROIDECTOMY      SOCIAL HISTORY: Social History   Socioeconomic History  . Marital status: Married    Spouse name: Not on file  . Number of children: Not on file  . Years of education: Not on file  . Highest education level: Not on file  Occupational History  . Not on file  Tobacco Use  . Smoking status: Never Smoker  . Smokeless tobacco: Never Used  Substance and Sexual Activity  . Alcohol use: Never  . Drug use: Never  . Sexual activity: Not on file  Other Topics Concern  . Not on file  Social History Narrative  . Not on file   Social Determinants  of Health   Financial Resource Strain: Not on file  Food Insecurity: Not on file  Transportation Needs: Not on file  Physical Activity: Not on file  Stress: Not on file  Social Connections: Not on file  Intimate Partner Violence: Not on file    FAMILY HISTORY: Family History  Problem Relation Age of Onset  . Stroke Mother   . Stroke Father   . Heart disease Sister   . Cancer Daughter   . Hypertension Son   . Heart disease Brother   . Cancer Brother   . Heart disease  Brother     ALLERGIES:  is allergic to amitriptyline, effexor xr [venlafaxine], lyrica [pregabalin], morphine, morphine and related, pravachol [pravastatin], and zolpidem.  MEDICATIONS:  Current Outpatient Medications  Medication Sig Dispense Refill  . acetaminophen (TYLENOL) 325 MG tablet Take 650 mg by mouth every 6 (six) hours as needed.    Marland Kitchen aspirin 81 MG chewable tablet Chew 81 mg by mouth daily.    Marland Kitchen b complex vitamins capsule Take 1 capsule by mouth daily.    . carvedilol (COREG) 12.5 MG tablet Take 1 tablet (12.5 mg total) by mouth 2 (two) times daily. 60 tablet 11  . cephALEXin (KEFLEX) 500 MG capsule Take 1 capsule (500 mg total) by mouth in the morning and at bedtime. 14 capsule 0  . chlorthalidone (HYGROTON) 25 MG tablet TAKE 1 TABLET BY MOUTH EVERY DAY AFTER BREAKFAST FOR BLOOD PRESSURE    . cholecalciferol (VITAMIN D3) 25 MCG (1000 UNIT) tablet Take 1,000 Units by mouth daily.    Marland Kitchen co-enzyme Q-10 50 MG capsule Take 50 mg by mouth daily.    Marland Kitchen glucosamine-chondroitin 500-400 MG tablet Take 1 tablet by mouth 3 (three) times daily.    . insulin glargine (LANTUS) 100 unit/mL SOPN Inject 13 Units into the skin daily.    Marland Kitchen JANTOVEN 4 MG tablet Take 4 mg by mouth daily.    Marland Kitchen levothyroxine (SYNTHROID) 100 MCG tablet Take 1 tablet (100 mcg total) by mouth daily. 90 tablet 0  . mirtazapine (REMERON) 7.5 MG tablet Take 7.5 mg by mouth at bedtime.    . Multiple Vitamin (MULTIVITAMIN) tablet Take 1 tablet by mouth daily.    . ramipril (ALTACE) 10 MG capsule Take 10 mg by mouth daily.    . Red Yeast Rice Extract (RED YEAST RICE PO) Take by mouth.    . rosuvastatin (CRESTOR) 20 MG tablet Take 1 tablet (20 mg total) by mouth daily. 90 tablet 0  . tamsulosin (FLOMAX) 0.4 MG CAPS capsule SMARTSIG:1 Capsule(s) By Mouth Every Evening    . warfarin (COUMADIN) 3 MG tablet TAKE 1 TABLET BY MOUTH DAILY ON TUESDAY, THURSDAY AND SATURDAY 38 tablet 0   No current facility-administered medications for  this visit.    REVIEW OF SYSTEMS:   Constitutional: ( - ) fevers, ( - )  chills , ( - ) night sweats Eyes: ( - ) blurriness of vision, ( - ) double vision, ( - ) watery eyes Ears, nose, mouth, throat, and face: ( - ) mucositis, ( - ) sore throat Respiratory: ( - ) cough, ( - ) dyspnea, ( - ) wheezes Cardiovascular: ( - ) palpitation, ( - ) chest discomfort, ( - ) lower extremity swelling Gastrointestinal:  ( - ) nausea, ( - ) heartburn, (+) diarrhea Skin: ( - ) abnormal skin rashes Lymphatics: ( - ) new lymphadenopathy, ( - ) easy bruising Neurological: ( - ) numbness, ( - )  tingling, ( - ) new weaknesses Behavioral/Psych: ( - ) mood change, ( - ) new changes  All other systems were reviewed with the patient and are negative.  PHYSICAL EXAMINATION: ECOG PERFORMANCE STATUS: 1 - Symptomatic but completely ambulatory  Vitals:   08/27/20 1357  BP: 128/71  Pulse: 78  Resp: 20  Temp: (!) 97.3 F (36.3 C)  SpO2: 100%   Filed Weights   08/27/20 1357  Weight: 171 lb 6.4 oz (77.7 kg)    GENERAL: well appearing gentleman in NAD. Patient was in a wheelchair during exam.  SKIN: skin color, texture, turgor are normal, no rashes or significant lesions EYES: conjunctiva are pink and non-injected, sclera clear OROPHARYNX: no exudate, no erythema; lips, buccal mucosa, and tongue normal  NECK: supple, non-tender LYMPH:  no palpable lymphadenopathy in the cervical, axillary or supraclavicular lymph nodes.  LUNGS: clear to auscultation and percussion with normal breathing effort HEART: regular rate & rhythm and no murmurs and no lower extremity edema ABDOMEN: soft, non-tender, non-distended, normal bowel sounds Musculoskeletal: no cyanosis of digits and no clubbing  PSYCH: alert & oriented x 3, fluent speech NEURO: no focal motor/sensory deficits  LABORATORY DATA:  I have reviewed the data as listed CBC Latest Ref Rng & Units 08/27/2020 08/19/2020 07/29/2020  WBC 4.0 - 10.5 K/uL 10.8(H)  13.4(H) 11.2(H)  Hemoglobin 13.0 - 17.0 g/dL 11.1(L) 10.6(L) 11.4(L)  Hematocrit 39.0 - 52.0 % 33.6(L) 32.3(L) 34.2(L)  Platelets 150 - 400 K/uL 213 156 213.0    CMP Latest Ref Rng & Units 08/27/2020 08/21/2020 08/19/2020  Glucose 70 - 99 mg/dL 149(H) 176(H) 277(H)  BUN 8 - 23 mg/dL 23 32(H) 51(H)  Creatinine 0.61 - 1.24 mg/dL 1.16 1.22 1.20  Sodium 135 - 145 mmol/L 136 138 136  Potassium 3.5 - 5.1 mmol/L 4.7 4.3 5.0  Chloride 98 - 111 mmol/L 108 104 108  CO2 22 - 32 mmol/L 20(L) 25 18(L)  Calcium 8.9 - 10.3 mg/dL 8.8(L) 8.7 8.5(L)  Total Protein 6.5 - 8.1 g/dL 7.6 - -  Total Bilirubin 0.3 - 1.2 mg/dL 0.2(L) - -  Alkaline Phos 38 - 126 U/L 56 - -  AST 15 - 41 U/L 25 - -  ALT 0 - 44 U/L 29 - -    RADIOGRAPHIC STUDIES: I have personally reviewed the radiological images as listed and agreed with the findings in the report. CT Head Wo Contrast  Result Date: 08/19/2020 CLINICAL DATA:  Pain following recent fall EXAM: CT HEAD WITHOUT CONTRAST TECHNIQUE: Contiguous axial images were obtained from the base of the skull through the vertex without intravenous contrast. COMPARISON:  None. FINDINGS: Brain: There is mild diffuse atrophy. There is no intracranial mass, hemorrhage, extra-axial fluid collection, or midline shift. There is decreased attenuation throughout the centra semiovale bilaterally. There is evidence of prior small infarcts in each thalamus. Decreased attenuation is noted in the anterior limb of the left external capsule. No acute appearing infarct is appreciable. Vascular: No hyperdense vessels. There is calcification in each distal vertebral artery and carotid siphon region. Skull: Bony calvarium appears intact. Sinuses/Orbits: There is a retention cyst in the inferior right maxillary antrum. There is mucosal thickening in several ethmoid air cells. Orbits appear symmetric bilaterally. Other: Mastoid air cells are clear. IMPRESSION: Atrophy with periventricular small vessel disease.  Prior small infarcts in each thalamus as well as in the anterior limb of the left external capsule. No acute infarct is evident. No mass or hemorrhage. There are foci of  arterial vascular calcification. There are foci of paranasal sinus disease. Electronically Signed   By: Lowella Grip III M.D.   On: 08/19/2020 09:39   MR BRAIN WO CONTRAST  Result Date: 08/19/2020 CLINICAL DATA:  Transient ischemic attack. Fall at home. Orthostatic. EXAM: MRI HEAD WITHOUT CONTRAST TECHNIQUE: Multiplanar, multiecho pulse sequences of the brain and surrounding structures were obtained without intravenous contrast. COMPARISON:  Same day head CT. FINDINGS: Brain: No evidence of acute infarct. Remote infarct in the lateral right temporal lobe. Additional remote lacunar infarcts involving the bilateral thalami and bilateral cerebellar hemispheres. Additional small T2 hyperintensities in the basal ganglia most likely represent dilated perivascular spaces. There is advanced/confluent T2/FLAIR hyperintensities within the white matter and pons, most likely related to chronic microvascular ischemic disease. Punctate area of susceptibility artifact in the right cerebellar hemisphere, likely related to prior microhemorrhage. Mild generalized cerebral volume loss. No hydrocephalus. Cavum septum pellucidum et vergae, anatomic variant. No acute hemorrhage. No mass lesion or abnormal mass effect. No extra-axial fluid collection. Vascular: Major arterial flow voids are maintained at the skull base. Skull and upper cervical spine: Normal marrow signal. Partially imaged upper cervical spine degenerative change. Sinuses/Orbits: Right maxillary sinus retention cyst versus polyp. Scattered ethmoid air cell mucosal thickening without air-fluid levels. Unremarkable orbits. Other: No sizable mastoid effusions. IMPRESSION: 1. No evidence of acute intracranial abnormality.  No acute infarct. 2. Remote right temporal lobe and bilateral basal ganglia  and cerebellar infarcts. 3. Advanced chronic microvascular ischemic disease. Electronically Signed   By: Margaretha Sheffield MD   On: 08/19/2020 15:16   CT ABDOMEN PELVIS W CONTRAST  Result Date: 08/19/2020 CLINICAL DATA:  Fall. EXAM: CT ABDOMEN AND PELVIS WITH CONTRAST TECHNIQUE: Multidetector CT imaging of the abdomen and pelvis was performed using the standard protocol following bolus administration of intravenous contrast. CONTRAST:  131m OMNIPAQUE IOHEXOL 300 MG/ML  SOLN COMPARISON:  None. FINDINGS: Lower chest: Visualized lung bases are unremarkable. Moderate size sliding-type hiatal hernia is noted. Hepatobiliary: No focal liver abnormality is seen. No gallstones, gallbladder wall thickening, or biliary dilatation. Pancreas: Unremarkable. No pancreatic ductal dilatation or surrounding inflammatory changes. Spleen: Normal in size without focal abnormality. Adrenals/Urinary Tract: Adrenal glands are unremarkable. Kidneys are normal, without renal calculi, focal lesion, or hydronephrosis. Bladder is unremarkable. Stomach/Bowel: There is no evidence of bowel obstruction or inflammation. Diverticulosis of descending and sigmoid colon is noted without inflammation. The appendix is not visualized. Vascular/Lymphatic: Aortic atherosclerosis. No enlarged abdominal or pelvic lymph nodes. Reproductive: Prostate is unremarkable. Other: Small fat containing periumbilical hernia is noted. No ascites is noted. Musculoskeletal: Multiple small lucencies are noted throughout the pelvis and visualized proximal femurs concerning for possible lytic lesions related to multiple myeloma or metastatic disease. IMPRESSION: 1. Moderate size sliding-type hiatal hernia. 2. Diverticulosis of descending and sigmoid colon is noted without inflammation. 3. Small fat containing periumbilical hernia. 4. Multiple small lucencies are noted throughout the pelvis and visualized proximal femurs concerning for possible lytic lesions related to  multiple myeloma or metastatic disease. MRI may be performed for further evaluation. Aortic Atherosclerosis (ICD10-I70.0). Electronically Signed   By: JMarijo ConceptionM.D.   On: 08/19/2020 09:42   DG Chest Port 1 View  Result Date: 08/19/2020 CLINICAL DATA:  Pacemaker. EXAM: PORTABLE CHEST 1 VIEW COMPARISON:  None. FINDINGS: The heart size and mediastinal contours are within normal limits. Both lungs are clear. Status post coronary bypass graft. Left-sided pacemaker is noted with leads in grossly good position. No pneumothorax or pleural effusion  is noted. Old left rib fractures are noted. IMPRESSION: No active disease. Electronically Signed   By: Marijo Conception M.D.   On: 08/19/2020 13:56   ECHOCARDIOGRAM COMPLETE  Result Date: 08/08/2020    ECHOCARDIOGRAM REPORT   Patient Name:   Thomas [name] Date of Exam: 08/07/2020 Medical Rec #:  [id number]       Height:       68.5 in Accession #:    [id number]      Weight:       168.4 lb Date of Birth:  October 03, 1928       BSA:          1.910 m Patient Age:    9 years        BP:           104/62 mmHg Patient Gender: M               HR:           75 bpm. Exam Location:  [address] Procedure: 2D Echo, Cardiac Doppler and Color Doppler Indications:    I25.10 CAD native vessel  History:        Patient has no prior history of Echocardiogram examinations.                 Prior CABG and Pacemaker; Risk Factors:Hypertension, Diabetes                 and Dyslipidemia. Sick sinus syndrome. Hypothyroidism. Insomnia.  Sonographer:    Diamond Nickel RCS Referring Phys: [id number] [place]  1. Left ventricular ejection fraction, by estimation, is 60 to 65%. The left ventricle has normal function. The left ventricle has no regional wall motion abnormalities. There is moderate left ventricular hypertrophy of the basal-septal segment. Left ventricular diastolic parameters are consistent with Grade I diastolic dysfunction (impaired relaxation). Elevated left  ventricular end-diastolic pressure.  2. Right ventricular systolic function is mildly reduced. The right ventricular size is normal.  3. The mitral valve is degenerative. Mild mitral valve regurgitation. No evidence of mitral stenosis. Moderate mitral annular calcification.  4. The aortic valve is normal in structure. There is mild calcification of the aortic valve. There is mild thickening of the aortic valve. Aortic valve regurgitation is not visualized. Mild to moderate aortic valve sclerosis/calcification is present, without any evidence of aortic stenosis.  5. Aortic dilatation noted. There is mild dilatation of the aortic root, measuring 40 mm. There is mild dilatation of the ascending aorta, measuring 43 mm. FINDINGS  Left Ventricle: Left ventricular ejection fraction, by estimation, is 60 to 65%. The left ventricle has normal function. The left ventricle has no regional wall motion abnormalities. The left ventricular internal cavity size was normal in size. There is  moderate left ventricular hypertrophy of the basal-septal segment. Left ventricular diastolic parameters are consistent with Grade I diastolic dysfunction (impaired relaxation). Elevated left ventricular end-diastolic pressure. Right Ventricle: The right ventricular size is normal. No increase in right ventricular wall thickness. Right ventricular systolic function is mildly reduced. Left Atrium: Left atrial size was normal in size. Right Atrium: Right atrial size was normal in size. Pericardium: There is no evidence of pericardial effusion. Mitral Valve: The mitral valve is degenerative in appearance. There is mild thickening of the mitral valve leaflet(s). Moderate mitral annular calcification. Mild mitral valve regurgitation. No evidence of mitral valve stenosis. Tricuspid Valve: The tricuspid valve is normal in structure. Tricuspid valve regurgitation is mild . No evidence of  tricuspid stenosis. Aortic Valve: The aortic valve is normal in  structure. There is mild calcification of the aortic valve. There is mild thickening of the aortic valve. There is moderate aortic valve annular calcification. Aortic valve regurgitation is not visualized. Mild  to moderate aortic valve sclerosis/calcification is present, without any evidence of aortic stenosis. Pulmonic Valve: The pulmonic valve was normal in structure. Pulmonic valve regurgitation is not visualized. No evidence of pulmonic stenosis. Aorta: Aortic dilatation noted. There is mild dilatation of the aortic root, measuring 40 mm. There is mild dilatation of the ascending aorta, measuring 43 mm. Venous: The inferior vena cava was not well visualized. IAS/Shunts: No atrial level shunt detected by color flow Doppler. Additional Comments: A pacer wire is visualized.  LEFT VENTRICLE PLAX 2D LVIDd:         3.20 cm  Diastology LVIDs:         2.10 cm  LV e' medial:    3.37 cm/s LV PW:         1.20 cm  LV E/e' medial:  19.1 LV IVS:        1.40 cm  LV e' lateral:   6.32 cm/s LVOT diam:     2.15 cm  LV E/e' lateral: 10.2 LV SV:         67 LV SV Index:   35 LVOT Area:     3.63 cm  RIGHT VENTRICLE RV Basal diam:  2.60 cm RV S prime:     6.87 cm/s TAPSE (M-mode): 1.2 cm RVSP:           23.4 mmHg LEFT ATRIUM             Index       RIGHT ATRIUM           Index LA diam:        4.50 cm 2.36 cm/m  RA Pressure: 3.00 mmHg LA Vol (A2C):   59.7 ml 31.26 ml/m RA Area:     12.70 cm LA Vol (A4C):   60.9 ml 31.88 ml/m RA Volume:   24.60 ml  12.88 ml/m LA Biplane Vol: 62.9 ml 32.93 ml/m  AORTIC VALVE LVOT Vmax:   81.80 cm/s LVOT Vmean:  47.400 cm/s LVOT VTI:    0.185 m  AORTA Ao Root diam: 4.00 cm MITRAL VALVE               TRICUSPID VALVE MV Area (PHT): 2.22 cm    TR Peak grad:   20.4 mmHg MV Decel Time: 342 msec    TR Vmax:        226.00 cm/s MV E velocity: 64.30 cm/s  Estimated RAP:  3.00 mmHg MV A velocity: 81.40 cm/s  RVSP:           23.4 mmHg MV E/A ratio:  0.79                            SHUNTS                             Systemic VTI:  0.18 m                            Systemic Diam: 2.15 cm Fransico Him MD Electronically signed by Fransico Him MD Signature Date/Time: 08/08/2020/7:48:27 AM    Final    [facility]  DEVICE CHECK  Result Date: 07/31/2020 Pacemaker check in clinic. Normal device function. Thresholds, sensing, impedances consistent with previous measurements. Device programmed to maximize longevity. No mode switch or high ventricular rates noted. Device programmed at appropriate safety margins. Histogram distribution appropriate for patient activity level. Device programmed to optimize intrinsic conduction. Estimated longevity 12.2 years. Request to transfer patient enrollment submitted in [facility] for remote monitoring, patient scheduled for next remote check on 10/30/20.   Patient education completed.Trena Platt, BSN, RN   ASSESSMENT & PLAN Thomas [name] is a 86 y.o. male with medical history significant for HLD, DMII, prior CVA, HTN, CAD s/p CABG in 2001,SSS s/p PPM, pulmonary embolismon warfarin and hypothyroidism. He presents to the clinic for evaluation for possible lytic lesions in the pelvis and proximal femurs seen on CT imaging from 08/19/20.   #Lytic Lesions --Differentials include multiple myeloma versus metastatic disease. Labs from 08/19/20 were reviewed including MM panel that revealed elevated IgM at 155 mg/dL, PSA was WNL, and anemia with hemoglobin of 10.6 g/dL.  --Recommend to proceed with multiple myeloma workup with labs that include CBC, CMP, LDH, serum FLC, and UPEP. If labs are unremarkable, we will consider obtaining MRI of pelvis/femur to further evaluate the bone lesions.   #Normocyctic Anemia --CBC from 08/19/20 shows worsening anemia with hemoglobin of 10.6 g/dL. Suspicious for possible GI bleed as patient reports frequent episodes of melena. Will do further workup with iron panel with TIBC, ferritin, retic and folate levels.  --Patient is scheduled for a follow up  with GI next week to discuss symptoms. Can consider repeat EGD/colonoscopy based on symptoms.   #Anticoagulation --Patient has been on chronic anticoagulation with coumadin for over 20 years due to history of DVT and PE. Discussed consideration of changing to DOAC such as Eliqius. Patient will discuss this with his cardiologist.   Orders Placed This Encounter  Procedures  . Lactate dehydrogenase (LDH)    Standing Status:   Future    Number of Occurrences:   1    Standing Expiration Date:   08/27/2021  . CBC with Differential (Cancer Center Only)    Standing Status:   Future    Number of Occurrences:   1    Standing Expiration Date:   08/27/2021  . Retic Panel    Standing Status:   Future    Number of Occurrences:   1    Standing Expiration Date:   08/27/2021  . CMP ([facility] only)    Standing Status:   Future    Number of Occurrences:   1    Standing Expiration Date:   08/27/2021  . Ferritin    Standing Status:   Future    Number of Occurrences:   1    Standing Expiration Date:   08/27/2021  . Iron and TIBC    Standing Status:   Future    Number of Occurrences:   1    Standing Expiration Date:   08/27/2021  . Folate, Serum    Standing Status:   Future    Number of Occurrences:   1    Standing Expiration Date:   08/27/2021  . 24-Hr Ur UPEP/UIFE/Light Chains/TP    Standing Status:   Future    Standing Expiration Date:   08/27/2021  . Kappa/lambda light chains    Standing Status:   Future    Number of Occurrences:   1    Standing Expiration Date:   08/27/2021    All questions were answered.  The patient knows to call the clinic with any problems, questions or concerns.  A total of more than 60 minutes were spent on this encounter and over half of that time was spent on counseling and coordination of care as outlined above.   Ledell Peoples, MD Department of Hematology/Oncology [place] [facility] Phone: [phone] Pager: [phone] Email:  [email or web address]  08/28/2020 8:55 PM

## 2020-08-28 ENCOUNTER — Encounter: Payer: Self-pay | Admitting: Hematology and Oncology

## 2020-08-28 LAB — KAPPA/LAMBDA LIGHT CHAINS
Kappa free light chain: 29.2 mg/L — ABNORMAL HIGH (ref 3.3–19.4)
Kappa, lambda light chain ratio: 1.22 (ref 0.26–1.65)
Lambda free light chains: 23.9 mg/L (ref 5.7–26.3)

## 2020-08-28 LAB — IRON AND TIBC
Iron: 55 ug/dL (ref 42–163)
Saturation Ratios: 15 % — ABNORMAL LOW (ref 20–55)
TIBC: 361 ug/dL (ref 202–409)
UIBC: 306 ug/dL (ref 117–376)

## 2020-08-28 LAB — FERRITIN: Ferritin: 80 ng/mL (ref 24–336)

## 2020-08-29 ENCOUNTER — Encounter: Payer: Self-pay | Admitting: Family Medicine

## 2020-08-29 ENCOUNTER — Telehealth: Payer: Self-pay | Admitting: Hematology and Oncology

## 2020-08-29 DIAGNOSIS — M898X8 Other specified disorders of bone, other site: Secondary | ICD-10-CM | POA: Diagnosis not present

## 2020-08-29 NOTE — Telephone Encounter (Signed)
Scheduled per los. Called and spoke with patients wife. Confirmed appt  

## 2020-08-30 ENCOUNTER — Other Ambulatory Visit: Payer: Self-pay | Admitting: *Deleted

## 2020-08-30 MED ORDER — INSULIN GLARGINE 100 UNITS/ML SOLOSTAR PEN: 13.0000 [IU] | PEN_INJECTOR | Freq: Every day | SUBCUTANEOUS | 3 refills | Status: DC

## 2020-08-30 NOTE — Telephone Encounter (Signed)
Ok with me. Please place any necessary orders. 

## 2020-08-30 NOTE — Telephone Encounter (Signed)
Rx send to pharmacy  

## 2020-08-31 ENCOUNTER — Inpatient Hospital Stay (HOSPITAL_COMMUNITY)
Admission: EM | Admit: 2020-08-31 | Discharge: 2020-09-08 | DRG: 065 | Disposition: A | Discharge status: Skilled Nursing Facility | Payer: Medicare Other | Attending: Family Medicine | Admitting: Family Medicine

## 2020-08-31 ENCOUNTER — Encounter (HOSPITAL_COMMUNITY): Payer: Self-pay | Admitting: Internal Medicine

## 2020-08-31 ENCOUNTER — Other Ambulatory Visit: Payer: Self-pay

## 2020-08-31 ENCOUNTER — Emergency Department (HOSPITAL_COMMUNITY): Payer: Medicare Other

## 2020-08-31 ENCOUNTER — Observation Stay (HOSPITAL_COMMUNITY): Payer: Medicare Other

## 2020-08-31 DIAGNOSIS — I69351 Hemiplegia and hemiparesis following cerebral infarction affecting right dominant side: Secondary | ICD-10-CM | POA: Diagnosis not present

## 2020-08-31 DIAGNOSIS — R29705 NIHSS score 5: Secondary | ICD-10-CM | POA: Diagnosis not present

## 2020-08-31 DIAGNOSIS — H02401 Unspecified ptosis of right eyelid: Secondary | ICD-10-CM | POA: Diagnosis not present

## 2020-08-31 DIAGNOSIS — I6389 Other cerebral infarction: Secondary | ICD-10-CM | POA: Diagnosis not present

## 2020-08-31 DIAGNOSIS — D649 Anemia, unspecified: Secondary | ICD-10-CM | POA: Diagnosis present

## 2020-08-31 DIAGNOSIS — I69820 Aphasia following other cerebrovascular disease: Secondary | ICD-10-CM | POA: Diagnosis not present

## 2020-08-31 DIAGNOSIS — I2510 Atherosclerotic heart disease of native coronary artery without angina pectoris: Secondary | ICD-10-CM | POA: Diagnosis present

## 2020-08-31 DIAGNOSIS — E039 Hypothyroidism, unspecified: Secondary | ICD-10-CM | POA: Diagnosis present

## 2020-08-31 DIAGNOSIS — M159 Polyosteoarthritis, unspecified: Secondary | ICD-10-CM | POA: Diagnosis present

## 2020-08-31 DIAGNOSIS — H9190 Unspecified hearing loss, unspecified ear: Secondary | ICD-10-CM | POA: Diagnosis present

## 2020-08-31 DIAGNOSIS — Z79899 Other long term (current) drug therapy: Secondary | ICD-10-CM

## 2020-08-31 DIAGNOSIS — E890 Postprocedural hypothyroidism: Secondary | ICD-10-CM | POA: Diagnosis not present

## 2020-08-31 DIAGNOSIS — E785 Hyperlipidemia, unspecified: Secondary | ICD-10-CM | POA: Diagnosis not present

## 2020-08-31 DIAGNOSIS — G459 Transient cerebral ischemic attack, unspecified: Principal | ICD-10-CM | POA: Diagnosis present

## 2020-08-31 DIAGNOSIS — I63531 Cerebral infarction due to unspecified occlusion or stenosis of right posterior cerebral artery: Principal | ICD-10-CM | POA: Diagnosis present

## 2020-08-31 DIAGNOSIS — E1165 Type 2 diabetes mellitus with hyperglycemia: Secondary | ICD-10-CM | POA: Diagnosis present

## 2020-08-31 DIAGNOSIS — B965 Pseudomonas (aeruginosa) (mallei) (pseudomallei) as the cause of diseases classified elsewhere: Secondary | ICD-10-CM | POA: Diagnosis present

## 2020-08-31 DIAGNOSIS — Z794 Long term (current) use of insulin: Secondary | ICD-10-CM

## 2020-08-31 DIAGNOSIS — I639 Cerebral infarction, unspecified: Secondary | ICD-10-CM | POA: Diagnosis present

## 2020-08-31 DIAGNOSIS — R4701 Aphasia: Secondary | ICD-10-CM | POA: Diagnosis present

## 2020-08-31 DIAGNOSIS — R29810 Facial weakness: Secondary | ICD-10-CM | POA: Diagnosis present

## 2020-08-31 DIAGNOSIS — Z20822 Contact with and (suspected) exposure to covid-19: Secondary | ICD-10-CM | POA: Diagnosis not present

## 2020-08-31 DIAGNOSIS — R9431 Abnormal electrocardiogram [ECG] [EKG]: Secondary | ICD-10-CM | POA: Diagnosis present

## 2020-08-31 DIAGNOSIS — G9349 Other encephalopathy: Secondary | ICD-10-CM | POA: Diagnosis not present

## 2020-08-31 DIAGNOSIS — I951 Orthostatic hypotension: Secondary | ICD-10-CM | POA: Diagnosis present

## 2020-08-31 DIAGNOSIS — I69320 Aphasia following cerebral infarction: Secondary | ICD-10-CM | POA: Diagnosis not present

## 2020-08-31 DIAGNOSIS — Z950 Presence of cardiac pacemaker: Secondary | ICD-10-CM | POA: Diagnosis not present

## 2020-08-31 DIAGNOSIS — I491 Atrial premature depolarization: Secondary | ICD-10-CM | POA: Diagnosis not present

## 2020-08-31 DIAGNOSIS — H3530 Unspecified macular degeneration: Secondary | ICD-10-CM | POA: Diagnosis present

## 2020-08-31 DIAGNOSIS — J3489 Other specified disorders of nose and nasal sinuses: Secondary | ICD-10-CM | POA: Diagnosis not present

## 2020-08-31 DIAGNOSIS — R29818 Other symptoms and signs involving the nervous system: Secondary | ICD-10-CM | POA: Diagnosis not present

## 2020-08-31 DIAGNOSIS — I10 Essential (primary) hypertension: Secondary | ICD-10-CM | POA: Diagnosis present

## 2020-08-31 DIAGNOSIS — N400 Enlarged prostate without lower urinary tract symptoms: Secondary | ICD-10-CM | POA: Diagnosis present

## 2020-08-31 DIAGNOSIS — E1169 Type 2 diabetes mellitus with other specified complication: Secondary | ICD-10-CM | POA: Diagnosis present

## 2020-08-31 DIAGNOSIS — I8290 Acute embolism and thrombosis of unspecified vein: Secondary | ICD-10-CM | POA: Diagnosis present

## 2020-08-31 DIAGNOSIS — Z955 Presence of coronary angioplasty implant and graft: Secondary | ICD-10-CM

## 2020-08-31 DIAGNOSIS — R2689 Other abnormalities of gait and mobility: Secondary | ICD-10-CM | POA: Diagnosis not present

## 2020-08-31 DIAGNOSIS — R8271 Bacteriuria: Secondary | ICD-10-CM | POA: Diagnosis not present

## 2020-08-31 DIAGNOSIS — Z7901 Long term (current) use of anticoagulants: Secondary | ICD-10-CM

## 2020-08-31 DIAGNOSIS — I152 Hypertension secondary to endocrine disorders: Secondary | ICD-10-CM | POA: Diagnosis present

## 2020-08-31 DIAGNOSIS — I252 Old myocardial infarction: Secondary | ICD-10-CM

## 2020-08-31 DIAGNOSIS — R4781 Slurred speech: Secondary | ICD-10-CM | POA: Diagnosis not present

## 2020-08-31 DIAGNOSIS — R54 Age-related physical debility: Secondary | ICD-10-CM | POA: Diagnosis present

## 2020-08-31 DIAGNOSIS — I63331 Cerebral infarction due to thrombosis of right posterior cerebral artery: Secondary | ICD-10-CM | POA: Diagnosis not present

## 2020-08-31 DIAGNOSIS — Z96659 Presence of unspecified artificial knee joint: Secondary | ICD-10-CM | POA: Diagnosis present

## 2020-08-31 DIAGNOSIS — G9389 Other specified disorders of brain: Secondary | ICD-10-CM | POA: Diagnosis not present

## 2020-08-31 DIAGNOSIS — Z86718 Personal history of other venous thrombosis and embolism: Secondary | ICD-10-CM

## 2020-08-31 DIAGNOSIS — I495 Sick sinus syndrome: Secondary | ICD-10-CM | POA: Diagnosis not present

## 2020-08-31 DIAGNOSIS — Z823 Family history of stroke: Secondary | ICD-10-CM

## 2020-08-31 DIAGNOSIS — M2550 Pain in unspecified joint: Secondary | ICD-10-CM | POA: Diagnosis not present

## 2020-08-31 DIAGNOSIS — Z951 Presence of aortocoronary bypass graft: Secondary | ICD-10-CM

## 2020-08-31 DIAGNOSIS — M6281 Muscle weakness (generalized): Secondary | ICD-10-CM | POA: Diagnosis not present

## 2020-08-31 DIAGNOSIS — Z7401 Bed confinement status: Secondary | ICD-10-CM | POA: Diagnosis not present

## 2020-08-31 DIAGNOSIS — Z8249 Family history of ischemic heart disease and other diseases of the circulatory system: Secondary | ICD-10-CM

## 2020-08-31 DIAGNOSIS — R404 Transient alteration of awareness: Secondary | ICD-10-CM | POA: Diagnosis not present

## 2020-08-31 DIAGNOSIS — R5381 Other malaise: Secondary | ICD-10-CM | POA: Diagnosis not present

## 2020-08-31 DIAGNOSIS — R41841 Cognitive communication deficit: Secondary | ICD-10-CM | POA: Diagnosis not present

## 2020-08-31 DIAGNOSIS — Z79890 Hormone replacement therapy (postmenopausal): Secondary | ICD-10-CM

## 2020-08-31 DIAGNOSIS — Z6826 Body mass index (BMI) 26.0-26.9, adult: Secondary | ICD-10-CM

## 2020-08-31 DIAGNOSIS — E43 Unspecified severe protein-calorie malnutrition: Secondary | ICD-10-CM | POA: Diagnosis not present

## 2020-08-31 LAB — CBC
HCT: 33.4 % — ABNORMAL LOW (ref 39.0–52.0)
Hemoglobin: 11 g/dL — ABNORMAL LOW (ref 13.0–17.0)
MCH: 32.5 pg (ref 26.0–34.0)
MCHC: 32.9 g/dL (ref 30.0–36.0)
MCV: 98.8 fL (ref 80.0–100.0)
Platelets: 194 10*3/uL (ref 150–400)
RBC: 3.38 MIL/uL — ABNORMAL LOW (ref 4.22–5.81)
RDW: 12.9 % (ref 11.5–15.5)
WBC: 10.7 10*3/uL — ABNORMAL HIGH (ref 4.0–10.5)
nRBC: 0 % (ref 0.0–0.2)

## 2020-08-31 LAB — DIFFERENTIAL
Abs Immature Granulocytes: 0.04 10*3/uL (ref 0.00–0.07)
Basophils Absolute: 0 10*3/uL (ref 0.0–0.1)
Basophils Relative: 0 %
Eosinophils Absolute: 0.3 10*3/uL (ref 0.0–0.5)
Eosinophils Relative: 3 %
Immature Granulocytes: 0 %
Lymphocytes Relative: 28 %
Lymphs Abs: 2.9 10*3/uL (ref 0.7–4.0)
Monocytes Absolute: 1 10*3/uL (ref 0.1–1.0)
Monocytes Relative: 10 %
Neutro Abs: 6.4 10*3/uL (ref 1.7–7.7)
Neutrophils Relative %: 59 %

## 2020-08-31 LAB — I-STAT CHEM 8, ED
BUN: 31 mg/dL — ABNORMAL HIGH (ref 8–23)
Calcium, Ion: 1.23 mmol/L (ref 1.15–1.40)
Chloride: 103 mmol/L (ref 98–111)
Creatinine, Ser: 1.4 mg/dL — ABNORMAL HIGH (ref 0.61–1.24)
Glucose, Bld: 208 mg/dL — ABNORMAL HIGH (ref 70–99)
HCT: 31 % — ABNORMAL LOW (ref 39.0–52.0)
Hemoglobin: 10.5 g/dL — ABNORMAL LOW (ref 13.0–17.0)
Potassium: 4.4 mmol/L (ref 3.5–5.1)
Sodium: 138 mmol/L (ref 135–145)
TCO2: 21 mmol/L — ABNORMAL LOW (ref 22–32)

## 2020-08-31 LAB — URINALYSIS, ROUTINE W REFLEX MICROSCOPIC
Bilirubin Urine: NEGATIVE
Glucose, UA: 50 mg/dL — AB
Hgb urine dipstick: NEGATIVE
Ketones, ur: NEGATIVE mg/dL
Leukocytes,Ua: NEGATIVE
Nitrite: NEGATIVE
Protein, ur: NEGATIVE mg/dL
Specific Gravity, Urine: 1.016 (ref 1.005–1.030)
pH: 5 (ref 5.0–8.0)

## 2020-08-31 LAB — PROTIME-INR
INR: 2.6 — ABNORMAL HIGH (ref 0.8–1.2)
Prothrombin Time: 27 seconds — ABNORMAL HIGH (ref 11.4–15.2)

## 2020-08-31 LAB — LIPID PANEL
Cholesterol: 90 mg/dL (ref 0–200)
HDL: 31 mg/dL — ABNORMAL LOW (ref >40)
LDL Cholesterol: 37 mg/dL (ref 0–99)
Total CHOL/HDL Ratio: 2.9 RATIO
Triglycerides: 108 mg/dL (ref <150)
VLDL: 22 mg/dL (ref 0–40)

## 2020-08-31 LAB — CBG MONITORING, ED: Glucose-Capillary: 199 mg/dL — ABNORMAL HIGH (ref 70–99)

## 2020-08-31 LAB — COMPREHENSIVE METABOLIC PANEL
ALT: 36 U/L (ref 0–44)
AST: 29 U/L (ref 15–41)
Albumin: 3.3 g/dL — ABNORMAL LOW (ref 3.5–5.0)
Alkaline Phosphatase: 41 U/L (ref 38–126)
Anion gap: 10 (ref 5–15)
BUN: 30 mg/dL — ABNORMAL HIGH (ref 8–23)
CO2: 21 mmol/L — ABNORMAL LOW (ref 22–32)
Calcium: 8.9 mg/dL (ref 8.9–10.3)
Chloride: 104 mmol/L (ref 98–111)
Creatinine, Ser: 1.37 mg/dL — ABNORMAL HIGH (ref 0.61–1.24)
GFR, Estimated: 49 mL/min — ABNORMAL LOW (ref >60)
Glucose, Bld: 214 mg/dL — ABNORMAL HIGH (ref 70–99)
Potassium: 4.4 mmol/L (ref 3.5–5.1)
Sodium: 135 mmol/L (ref 135–145)
Total Bilirubin: 0.5 mg/dL (ref 0.3–1.2)
Total Protein: 6.5 g/dL (ref 6.5–8.1)

## 2020-08-31 LAB — MAGNESIUM: Magnesium: 1.7 mg/dL (ref 1.7–2.4)

## 2020-08-31 LAB — HEMOGLOBIN A1C
Hgb A1c MFr Bld: 7.5 % — ABNORMAL HIGH (ref 4.8–5.6)
Mean Plasma Glucose: 168.55 mg/dL

## 2020-08-31 LAB — APTT: aPTT: 39 seconds — ABNORMAL HIGH (ref 24–36)

## 2020-08-31 LAB — SARS CORONAVIRUS 2 (TAT 6-24 HRS): SARS Coronavirus 2: NEGATIVE

## 2020-08-31 LAB — GLUCOSE, CAPILLARY: Glucose-Capillary: 156 mg/dL — ABNORMAL HIGH (ref 70–99)

## 2020-08-31 MED ORDER — MIRTAZAPINE 15 MG PO TABS
7.5000 mg | ORAL_TABLET | Freq: Every day | ORAL | Status: DC
  Administered 2020-08-31 – 2020-09-08 (×9): 7.5 mg via ORAL
  Filled 2020-08-31 (×10): qty 1

## 2020-08-31 MED ORDER — ONDANSETRON HCL 4 MG PO TABS: 4.0000 mg | ORAL_TABLET | Freq: Four times a day (QID) | ORAL | Status: DC | PRN

## 2020-08-31 MED ORDER — STROKE: EARLY STAGES OF RECOVERY BOOK
Freq: Once | Status: AC
  Administered 2020-08-31: 1
  Filled 2020-08-31: qty 1

## 2020-08-31 MED ORDER — ACETAMINOPHEN 650 MG RE SUPP: 650.0000 mg | Freq: Four times a day (QID) | RECTAL | Status: DC | PRN

## 2020-08-31 MED ORDER — ONDANSETRON HCL 4 MG/2ML IJ SOLN: 4.0000 mg | Freq: Four times a day (QID) | INTRAMUSCULAR | Status: DC | PRN

## 2020-08-31 MED ORDER — WARFARIN SODIUM 3 MG PO TABS: 3.0000 mg | ORAL_TABLET | ORAL | Status: DC

## 2020-08-31 MED ORDER — ACETAMINOPHEN 325 MG PO TABS
650.0000 mg | ORAL_TABLET | Freq: Four times a day (QID) | ORAL | Status: DC | PRN
  Administered 2020-09-01: 650 mg via ORAL
  Filled 2020-08-31: qty 2

## 2020-08-31 MED ORDER — SODIUM CHLORIDE 0.9 % IV BOLUS
500.0000 mL | Freq: Once | INTRAVENOUS | Status: AC
  Administered 2020-08-31: 500 mL via INTRAVENOUS

## 2020-08-31 MED ORDER — WARFARIN SODIUM 4 MG PO TABS: 4.0000 mg | ORAL_TABLET | ORAL | Status: DC

## 2020-08-31 MED ORDER — WARFARIN - PHARMACIST DOSING INPATIENT: Freq: Every day | Status: DC

## 2020-08-31 MED ORDER — CEPHALEXIN 500 MG PO CAPS
500.0000 mg | ORAL_CAPSULE | Freq: Two times a day (BID) | ORAL | Status: DC
  Administered 2020-08-31 – 2020-09-01 (×3): 500 mg via ORAL
  Filled 2020-08-31: qty 1
  Filled 2020-08-31: qty 2
  Filled 2020-08-31: qty 1

## 2020-08-31 MED ORDER — MAGNESIUM SULFATE 2 GM/50ML IV SOLN
2.0000 g | Freq: Once | INTRAVENOUS | Status: AC
  Administered 2020-08-31: 2 g via INTRAVENOUS
  Filled 2020-08-31: qty 50

## 2020-08-31 MED ORDER — SODIUM CHLORIDE 0.9% FLUSH
3.0000 mL | Freq: Once | INTRAVENOUS | Status: AC
  Administered 2020-08-31: 3 mL via INTRAVENOUS

## 2020-08-31 MED ORDER — INSULIN GLARGINE 100 UNIT/ML ~~LOC~~ SOLN
13.0000 [IU] | Freq: Every day | SUBCUTANEOUS | Status: DC
  Administered 2020-08-31 – 2020-09-08 (×9): 13 [IU] via SUBCUTANEOUS
  Filled 2020-08-31 (×9): qty 0.13

## 2020-08-31 MED ORDER — ASPIRIN 81 MG PO CHEW
81.0000 mg | CHEWABLE_TABLET | Freq: Every day | ORAL | Status: DC
  Administered 2020-08-31 – 2020-09-08 (×9): 81 mg via ORAL
  Filled 2020-08-31 (×9): qty 1

## 2020-08-31 MED ORDER — PROCHLORPERAZINE EDISYLATE 10 MG/2ML IJ SOLN: 5.0000 mg | INTRAMUSCULAR | Status: DC | PRN

## 2020-08-31 MED ORDER — VITAMIN D 25 MCG (1000 UNIT) PO TABS
1000.0000 [IU] | ORAL_TABLET | Freq: Every day | ORAL | Status: DC
  Administered 2020-08-31 – 2020-09-08 (×9): 1000 [IU] via ORAL
  Filled 2020-08-31 (×9): qty 1

## 2020-08-31 MED ORDER — TAMSULOSIN HCL 0.4 MG PO CAPS
0.4000 mg | ORAL_CAPSULE | Freq: Every day | ORAL | Status: DC
  Administered 2020-08-31 – 2020-09-08 (×9): 0.4 mg via ORAL
  Filled 2020-08-31 (×9): qty 1

## 2020-08-31 MED ORDER — ROSUVASTATIN CALCIUM 20 MG PO TABS
20.0000 mg | ORAL_TABLET | Freq: Every day | ORAL | Status: DC
  Administered 2020-08-31 – 2020-09-08 (×9): 20 mg via ORAL
  Filled 2020-08-31 (×9): qty 1

## 2020-08-31 MED ORDER — SODIUM CHLORIDE 0.9 % IV SOLN: INTRAVENOUS | Status: AC

## 2020-08-31 MED ORDER — IOHEXOL 350 MG/ML SOLN
75.0000 mL | Freq: Once | INTRAVENOUS | Status: AC | PRN
  Administered 2020-08-31: 75 mL via INTRAVENOUS

## 2020-08-31 MED ORDER — WARFARIN SODIUM 4 MG PO TABS
4.0000 mg | ORAL_TABLET | ORAL | Status: DC
  Administered 2020-08-31: 4 mg via ORAL
  Filled 2020-08-31: qty 1

## 2020-08-31 MED ORDER — SODIUM CHLORIDE 0.9 % IV SOLN: INTRAVENOUS | Status: DC

## 2020-08-31 MED ORDER — LEVOTHYROXINE SODIUM 100 MCG PO TABS
100.0000 ug | ORAL_TABLET | Freq: Every day | ORAL | Status: DC
  Administered 2020-08-31 – 2020-09-08 (×9): 100 ug via ORAL
  Filled 2020-08-31 (×10): qty 1

## 2020-08-31 NOTE — ED Notes (Signed)
PT/OT at the bedside.

## 2020-08-31 NOTE — ED Notes (Signed)
Agree with triage note. Vitals taken. Pt alert to person and place. Patient able to speak full sentences. Respirations regular/unlabored. Connected to cardiac monitor, BP, pulse ox. Stretcher low, wheels locked, call bell within reach. Family at bedside.

## 2020-08-31 NOTE — Progress Notes (Deleted)
STROKE TEAM PROGRESS NOTE   INTERVAL HISTORY His wife and RN is at the bedside. He is very HOH and w/o hearing aids. He also has macular degeneration and has poor vision at baseline, however he tells me he had "trouble with vision" at the time his wife found him with confusion. Currently, confusion has improved. Right side weakness is residual from previous stroke his wife tells me. Wife gives further info regarding presentation: She takes his Bp at home and she noted it was very low: 80-90s- 40-50s.     Vitals:   08/31/20 0900 08/31/20 1100 08/31/20 1400 08/31/20 1500  BP: 97/80 113/75 138/65 (!) 156/70  Pulse: 70 70 69 71  Resp: '17 16 20 ' (!) 23  Temp:      TempSrc:      SpO2: 98% 99% 97% 96%  Weight:      Height:       CBC:  Recent Labs  Lab 08/27/20 1526 08/31/20 0059 08/31/20 0101  WBC 10.8* 10.7*  --   NEUTROABS 5.8 6.4  --   HGB 11.1* 11.0* 10.5*  HCT 33.6* 33.4* 31.0*  MCV 97.1 98.8  --   PLT 213 194  --    Basic Metabolic Panel:  Recent Labs  Lab 08/27/20 1526 08/31/20 0059 08/31/20 0101 08/31/20 0523  NA 136 135 138  --   K 4.7 4.4 4.4  --   CL 108 104 103  --   CO2 20* 21*  --   --   GLUCOSE 149* 214* 208*  --   BUN 23 30* 31*  --   CREATININE 1.16 1.37* 1.40*  --   CALCIUM 8.8* 8.9  --   --   MG  --   --   --  1.7   Lipid Panel:  Recent Labs  Lab 08/31/20 0523  CHOL 90  TRIG 108  HDL 31*  CHOLHDL 2.9  VLDL 22  LDLCALC 37   HgbA1c:  Recent Labs  Lab 08/31/20 0523  HGBA1C 7.5*   Urine Drug Screen: No results for input(s): LABOPIA, COCAINSCRNUR, LABBENZ, AMPHETMU, THCU, LABBARB in the last 168 hours.  Alcohol Level No results for input(s): ETH in the last 168 hours.  IMAGING past 24 hours CT ANGIO HEAD W OR WO CONTRAST  Result Date: 08/31/2020 CLINICAL DATA:  Right-sided deficits and right facial droop. EXAM: CT ANGIOGRAPHY HEAD AND NECK TECHNIQUE: Multidetector CT imaging of the head and neck was performed using the standard protocol  during bolus administration of intravenous contrast. Multiplanar CT image reconstructions and MIPs were obtained to evaluate the vascular anatomy. Carotid stenosis measurements (when applicable) are obtained utilizing NASCET criteria, using the distal internal carotid diameter as the denominator. CONTRAST:  5m OMNIPAQUE IOHEXOL 350 MG/ML SOLN COMPARISON:  Head CT from earlier today FINDINGS: CTA NECK FINDINGS Aortic arch: 2 vessel arch with atheromatous calcification. Right carotid system: Diffuse atheromatous wall thickening with superimposed calcified plaques. Irregular plaque at the left ICA bifurcation which causes 60% stenosis at the ICA origin. No ulceration or dissection seen. Left carotid system: Diffuse atheromatous plaque which is mixed density. No flow limiting stenosis or ulceration. Vertebral arteries: No proximal subclavian stenosis. Diminutive right vertebral artery, likely both congenital and atheromatous, but patent to the dura. Mild atheromatous narrowing at the left vertebral origin. Skeleton: Advanced degenerative facet spurring with multilevel endplate spurring. No acute osseous finding. Other neck: No acute finding. Upper chest: Negative Review of the MIP images confirms the above findings CTA HEAD FINDINGS  Anterior circulation: Diffuse calcified plaque along the carotid siphons. Approximately 50% stenosis of the right ICA at the paraclinoid segment. Diffuse atheromatous irregularity and moderate to advanced narrowing beginning at the M1 segments and affecting the right more than left MCA branches. The ACA distribution is equally affected and there is bilateral severe Peri callosal narrowings Posterior circulation: Calcified plaque on both V4 segments. The non dominant right V4 segment ends in the right PICA. Diffuse atheromatous irregularity of the left vertebral and basilar arteries with a moderate mid basilar stenosis. Fetal type right PCA. Extensive atheromatous irregularity to bilateral  posterior cerebral arteries especially at the P3 segments and beyond. Venous sinuses: Diffusely patent high density left-sided venous system attributed to reflux from the chest. The left brachiocephalic vein is stenotic in the setting of pacer. Anatomic variants: Fetal type right PCA Review of the MIP images confirms the above findings IMPRESSION: 1. No emergent finding. 2. Severe intracranial atherosclerosis with widespread and high-grade stenoses. 3. Atherosclerosis in the neck with up to 60% narrowing at the right ICA origin. 4. Stenotic left brachiocephalic vein in the setting of pacer lead. Electronically Signed   By: Monte Fantasia M.D.   On: 08/31/2020 08:54   CT ANGIO NECK W OR WO CONTRAST  Result Date: 08/31/2020 CLINICAL DATA:  Right-sided deficits and right facial droop. EXAM: CT ANGIOGRAPHY HEAD AND NECK TECHNIQUE: Multidetector CT imaging of the head and neck was performed using the standard protocol during bolus administration of intravenous contrast. Multiplanar CT image reconstructions and MIPs were obtained to evaluate the vascular anatomy. Carotid stenosis measurements (when applicable) are obtained utilizing NASCET criteria, using the distal internal carotid diameter as the denominator. CONTRAST:  64m OMNIPAQUE IOHEXOL 350 MG/ML SOLN COMPARISON:  Head CT from earlier today FINDINGS: CTA NECK FINDINGS Aortic arch: 2 vessel arch with atheromatous calcification. Right carotid system: Diffuse atheromatous wall thickening with superimposed calcified plaques. Irregular plaque at the left ICA bifurcation which causes 60% stenosis at the ICA origin. No ulceration or dissection seen. Left carotid system: Diffuse atheromatous plaque which is mixed density. No flow limiting stenosis or ulceration. Vertebral arteries: No proximal subclavian stenosis. Diminutive right vertebral artery, likely both congenital and atheromatous, but patent to the dura. Mild atheromatous narrowing at the left vertebral  origin. Skeleton: Advanced degenerative facet spurring with multilevel endplate spurring. No acute osseous finding. Other neck: No acute finding. Upper chest: Negative Review of the MIP images confirms the above findings CTA HEAD FINDINGS Anterior circulation: Diffuse calcified plaque along the carotid siphons. Approximately 50% stenosis of the right ICA at the paraclinoid segment. Diffuse atheromatous irregularity and moderate to advanced narrowing beginning at the M1 segments and affecting the right more than left MCA branches. The ACA distribution is equally affected and there is bilateral severe Peri callosal narrowings Posterior circulation: Calcified plaque on both V4 segments. The non dominant right V4 segment ends in the right PICA. Diffuse atheromatous irregularity of the left vertebral and basilar arteries with a moderate mid basilar stenosis. Fetal type right PCA. Extensive atheromatous irregularity to bilateral posterior cerebral arteries especially at the P3 segments and beyond. Venous sinuses: Diffusely patent high density left-sided venous system attributed to reflux from the chest. The left brachiocephalic vein is stenotic in the setting of pacer. Anatomic variants: Fetal type right PCA Review of the MIP images confirms the above findings IMPRESSION: 1. No emergent finding. 2. Severe intracranial atherosclerosis with widespread and high-grade stenoses. 3. Atherosclerosis in the neck with up to 60% narrowing at the  right ICA origin. 4. Stenotic left brachiocephalic vein in the setting of pacer lead. Electronically Signed   By: Monte Fantasia M.D.   On: 08/31/2020 08:54   CT HEAD CODE STROKE WO CONTRAST  Result Date: 08/31/2020 CLINICAL DATA:  Code stroke. Initial evaluation for acute right-sided deficits, right facial droop. EXAM: CT HEAD WITHOUT CONTRAST TECHNIQUE: Contiguous axial images were obtained from the base of the skull through the vertex without intravenous contrast. COMPARISON:   Previous MRI from 08/19/2020 FINDINGS: Brain: Generalized age-related cerebral atrophy with advanced chronic microvascular ischemic disease. Associated remote lacunar infarcts noted within the bilateral thalami. Additional small remote right temporal lobe infarct. There is new wedge-shaped parenchymal hypodensity involving the right occipital pole, consistent with an evolving acute to early subacute right PCA territory infarct. No associated hemorrhage or mass effect. No other visible acute large vessel territory infarct. No intracranial hemorrhage elsewhere within the brain. No mass lesion, midline shift or mass effect. No hydrocephalus or extra-axial fluid collection. Vascular: No hyperdense vessel. Calcified atherosclerosis present at skull base. Skull: Scalp soft tissues and calvarium within normal limits. Sinuses/Orbits: Globes and orbital soft tissues demonstrate no acute finding. Scattered mucosal thickening noted within the ethmoidal air cells. Small right maxillary sinus retention cyst. No mastoid effusion. Other: None. ASPECTS [facility] Stroke Program Early CT Score) - Ganglionic level infarction (caudate, lentiform nuclei, internal capsule, insula, M1-M3 cortex): 7 - Supraganglionic infarction (M4-M6 cortex): 3 Total score (0-10 with 10 being normal): 10 IMPRESSION: 1. Evolving acute to early subacute right PCA distribution infarct, new as compared to recent MRI from 08/19/2020. No associated hemorrhage or mass effect. 2. No other acute intracranial abnormality. 3. ASPECTS is 10. 4. Underlying age-related cerebral atrophy with advanced chronic microvascular ischemic disease, with additional chronic right temporal lobe infarct. These results were communicated to Dr. Theda Sers at 1:18 amon 3/6/2022by text page via the [facility] messaging system. Electronically Signed   By: Jeannine Boga M.D.   On: 08/31/2020 01:21    PHYSICAL EXAM Constitutional: Appears well-developed and well-nourished. No acute  distress Psych: Affect appropriate to situation Eyes: No scleral injection HENT: No OP obstruction. Head: Normocephalic.  Cardiovascular: Normal rate and regular rhythm.  Respiratory: Effort normal, symmetric excursions bilaterally, no audible wheezing. GI: Soft.  No distension. There is no tenderness.  Skin: WDI  Neuro: Mental Status: Patient is awake, alert, oriented to person, place, year. Patient is able to give a reasonable history. Speech is fluent, basic intact comprehension and repetition. No signs of aphasia or neglect. CN: Visual Fields are difficult to fully assess d/t baseline poor vision (Macular degeneration). Pupils are ~69m  equal, reactive to light. EOMI without ptosis or diploplia. Facial sensation is symmetric to LT.Facial movement is symmetric, but at rest there is slight right flattening of nasolabial fold. Very poor hearing at baseline. Tongue is midline.Shoulder shrug is symmetric. Motor:Tone is normal. Bulk is normal. 4+/5 grip strength on right, with pronator drift noted. Gen weakness in bilt legs, but equal.  Sensation states sensation is "different" to light touch and temperature in the right arm and legs. Deep Tendon Reflexes: 2+ and symmetric in the biceps and patellae 1+. Toes mute FNF intact bilaterally. C/o pain with attempt to do HTS Gait - Deferred  ASSESSMENT/PLAN Mr. ASeyon Straderis a 87y.o. male with history of coagulopathy, CAD, DM, HTN, HLD, coagulopathy on coumadin, chronic strokes with residual right sided deficits, right facial droop. This admission he presents with confusion and "vision" problems in setting of low BP.  CTH shows new early subacute left PCA distribution stroke.   Stroke - R occipital infarct, etiology likely secondary to diffuse severe intracranial stenosis in setting of low BP or orthostatic hypotension. However, hypercoagulable or occult afib with fluctuating INR is also possible  CTH: early subacute right PCA distribution  stroke.  CTA head & neck severe intracranial atherosclerosis with widespread and high-grade stenoses. Right ICA bulb 60% stenosis  MRI  Confirms acute right occipital lobe stroke  2D Echo 08/08/20 EF 60-65%  LDL 37  HgbA1c 7.5  VTE prophylaxis - coumadin  ASA + Coumadin prior to admission, now on ASA 81 and coumadin.   Therapy recommendations:  SNF  Disposition:  pending  Intracranial severe stenosis  CTA head & neck severe intracranial atherosclerosis with widespread and high-grade stenoses.   ?Orthostatic hypotension Hx of hypertension  Home meds:  Coreg, hygroton, Altace   Wife stated that pt BP at home was low 80/90s- 40/50s  Pt stated that his BP was low when he stands up  Orthostatic vitals unremarkable  Long-term BP goal 130-150 if possible given severe intracranial stenosis  Episodic right sided weakness  Per cardiology note 2/25 pt has episodic right sided weakness after sleeping on the right side. Suspect nerve compression. MRI negative for stroke at that time  Now I am wondering whether these episodes were due to orthostatic hypotension or low BP at home.  Anticoagulation   Patient has been on chronic anticoagulation with coumadin for over 20 years due to history of DVT and PE  Per cardiology note 2/25 - continue coumadin  Per oncology note 3/2 - consider change to eliquis - pt will discuss with cardiology  May consider pacemaker interrogation to rule out afib - but will not change management  Pt stated that it is hard to him to keep INR at goal - INR 2.6 on admission  From a neurological standpoint, I would think that if he has had a stroke with therapeutic INR, Eliquis would be favored.  I would let the discussion happen between the outpatient cardiologist and the oncologist.  Discussed my plan with the hospitalist on secure chat.  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 This patient's care requiresreview of multiple databases, neurological assessment, discussion with family, other specialists and medical decision making of high complexity.    To contact Stroke Continuity provider, please refer to [email or web address] After hours, contact General Neurology

## 2020-08-31 NOTE — ED Triage Notes (Signed)
Pt via ems possible stroke. Last known normal 2300. Right sided weakness with right sided facial drooping. Aphasia per ems. BP initially 80/50 then 106/66.

## 2020-08-31 NOTE — Consult Note (Signed)
Neurology Consult H&P  Thomas Thomas [name] MR# [id number] 08/31/2020   CC: Code stroke  History is obtained from: ED staff chart and patient  HPI: Thomas Thomas [name] is a 87 y.o. male PMHx as reviewed below, coagulopathy on coumadin with right sided deficits, right facial droop, and speech difficulty.  LKW: ~2300 tpa given: No too mild to treat, INR 2.6 IR Thrombectomy No Modified Rankin Scale: 0-Completely asymptomatic and back to baseline post- stroke NIHSS: 0-1  ROS: A complete ROS was performed and is negative except as noted in the HPI.   Past Medical History:  Diagnosis Date  . Arthritis   . Clotting disorder ([name])   . Coronary artery disease   . Diabetes ([name])   . Hyperlipidemia   . Hypertension   . Kidney disease   . Loss of hearing   . Macular degeneration   . Myocardial infarction ([name])   . Osteoarthritis   . Stroke [facility])     Family History  Problem Relation Age of Onset  . Stroke Mother   . Stroke Father   . Heart disease Sister   . Cancer Daughter   . Hypertension Son   . Heart disease Brother   . Cancer Brother   . Heart disease Brother     Social History:  reports that he has never smoked. He has never used smokeless tobacco. He reports that he does not drink alcohol and does not use drugs.   Prior to Admission medications   Medication Sig Start Date End Date Taking? Authorizing Provider  acetaminophen (TYLENOL) 325 MG tablet Take 650 mg by mouth every 6 (six) hours as needed.    [provider]  aspirin 81 MG chewable tablet Chew 81 mg by mouth daily.    [provider]  b complex vitamins capsule Take 1 capsule by mouth daily.    [provider]  carvedilol (COREG) 12.5 MG tablet Take 1 tablet (12.5 mg total) by mouth 2 (two) times daily. 08/07/20 08/02/21  Meriam Sprague, MD  cephALEXin (KEFLEX) 500 MG capsule Take 1 capsule (500 mg total) by mouth in the morning and at bedtime. 08/25/20   Ardith Dark, MD   chlorthalidone (HYGROTON) 25 MG tablet TAKE 1 TABLET BY MOUTH EVERY DAY AFTER BREAKFAST FOR BLOOD PRESSURE 05/31/20   [provider]  cholecalciferol (VITAMIN D3) 25 MCG (1000 UNIT) tablet Take 1,000 Units by mouth daily.    [provider]  co-enzyme Q-10 50 MG capsule Take 50 mg by mouth daily.    [provider]  glucosamine-chondroitin 500-400 MG tablet Take 1 tablet by mouth 3 (three) times daily.    [provider]  insulin glargine (LANTUS) 100 unit/mL SOPN Inject 13 Units into the skin daily. 08/30/20   Ardith Dark, MD  JANTOVEN 4 MG tablet Take 4 mg by mouth daily. 07/09/20   [provider]  levothyroxine (SYNTHROID) 100 MCG tablet Take 1 tablet (100 mcg total) by mouth daily. 08/19/20   Ardith Dark, MD  mirtazapine (REMERON) 7.5 MG tablet Take 7.5 mg by mouth at bedtime. 07/11/20   [provider]  Multiple Vitamin (MULTIVITAMIN) tablet Take 1 tablet by mouth daily.    [provider]  ramipril (ALTACE) 10 MG capsule Take 10 mg by mouth daily. 05/23/20   [provider]  Red Yeast Rice Extract (RED YEAST RICE PO) Take by mouth.    [provider]  rosuvastatin (CRESTOR) 20 MG tablet Take 1 tablet (20  mg total) by mouth daily. 07/21/20   Meriam Sprague, MD  tamsulosin (FLOMAX) 0.4 MG CAPS capsule SMARTSIG:1 Capsule(s) By Mouth Every Evening 07/11/20   [provider]  warfarin (COUMADIN) 3 MG tablet TAKE 1 TABLET BY MOUTH DAILY ON TUESDAY, THURSDAY AND SATURDAY 07/23/20   Ardith Dark, MD    Exam: Current vital signs: BP 119/66   Pulse 70   Resp (!) 22   Ht 5\' 8"  (1.727 m)   Wt 77.7 kg   SpO2 99%   BMI 26.05 kg/m   Physical Exam  Constitutional: Appears well-developed and well-nourished.  Psych: Affect appropriate to situation Eyes: No scleral injection HENT: No OP obstruction. Head: Normocephalic.  Cardiovascular: Normal rate and regular rhythm.  Respiratory: Effort  normal, symmetric excursions bilaterally, no audible wheezing. GI: Soft.  No distension. There is no tenderness.  Skin: WDI  Neuro: Mental Status: Patient is awake, alert, oriented to person, place, year. Patient is able to give a reasonable history. Speech sparse, occasionally fluent, intact comprehension and repetition. No signs of aphasia or neglect. Visual Fields are full. Pupils are ~3mm  equal, reactive to light. EOMI without ptosis or diploplia.  Facial sensation is symmetric to temperature Facial movement is symmetric.  Hearing is intact to voice. Uvula midline and palate elevates symmetrically. Shoulder shrug is symmetric. Tongue is midline without atrophy or fasciculations.  Tone is normal. Bulk is normal. 4/5 strength was present in right extremities. Sensation is symmetric to light touch and temperature in the arms and legs. Deep Tendon Reflexes: 2+ and symmetric in the biceps and patellae. Toes mute FNF and HKS are intact bilaterally. Gait - Deferred  I have reviewed labs in epic and the pertinent results are:   Ref. Range 08/31/2020 00:59  Glucose Latest Ref Range: 70 - 99 mg/dL 10/31/2020 (H)  BUN Latest Ref Range: 8 - 23 mg/dL 30 (H)  Creatinine Latest Ref Range: 0.61 - 1.24 mg/dL 568 (H)    I have reviewed the images obtained: NCT head showed Thomas [name] subacute stroke in the right PCA distribution which is new as compared to recent NCT head 08/19/2020 ASPECTS 10. No associated hemorrhage or mass effect.             08/31/2020   ~ roughly ~    08/19/2020   Reviewed MRI 08/19/2020 which showed chronic lacunar strokes in bilateral centrum semiovale, bilateral mesencephalon, right temporal lobe, and cerebellum. Prominent leukoaraiosis.  Assessment: Thomas Thomas [name] is a 87 y.o. male PMHx as above, coagulopathy, CAD, DM, HTN, HLD, coagulopathy on coumadin, chronic strokes with right sided deficits, right facial droop with new Thomas [name] subacute left PCA distribution stroke.   The   Impression:  Thomas [name] subacute stroke (<71mo) in the right PCA distribution. Coagulopathy on coumadin INR 2.6 Chronic multifocal strokes. Hyperglycemia HTN CAD HLD DM 2   Plan: - MRI brain without contrast. - Recommend vascular imaging with MRA head and neck. - Recent echocardiogram done may not need repeat. - Recommend labs: HbA1c, lipid panel, TSH. - Recommend Statin if LDL > 70 - Continue anticoagulation - SBP goal <160. - Telemetry monitoring for arrhythmia. - Recommend bedside Swallow screen. - Recommend Stroke education. - Recommend PT/OT/SLP consult.  This patient is critically ill and at significant risk of neurological worsening, death and care requires constant monitoring of vital signs, hemodynamics,respiratory and cardiac monitoring, neurological assessment, discussion with family, other specialists and medical decision making of high complexity. I spent 73 minutes of neurocritical care time  in  the care of  this patient. This was time spent independent of any time provided by nurse practitioner or PA.  Electronically signed by:  Marisue Humble, MD Page: [phone] 08/31/2020, 2:03 AM

## 2020-08-31 NOTE — ED Notes (Signed)
Request put in for transportation to [facility]

## 2020-08-31 NOTE — ED Notes (Signed)
Lunch Tray Ordered @ 1004. 

## 2020-08-31 NOTE — Progress Notes (Signed)
Patient is admitted this am due to right sided facial droop  found to have:  Acute CVA:  "Evolving acute to early subacute right PCA distribution infarct", acute CVA though possible related to hypotension, MRI pending, neurology following He is on chronic coumadin, INR therapeutic on admission   AKI/orthostatic hypotension: ua unremarkable, start hydration, check am cortisol , repeat bmp in am, hold home bp meds   FTT: baseline poor functional statu with residual right hemiparesis from prior CVA, poor vision due to macular degeneration, hard of hearing.   I discussed with him and his wife regarding SNF placement  For short term rehab after medical work up, both arranged.   He was recently found to have bone lytic lesions , still under work up by oncology, I  discussed code status with him, he states he does not want intubation but wants cpr, I discussed with him that CPR may provide harm to him rather than benefit due to he already has bone issues, he is going to further discuss with his family, currently he is partial code.

## 2020-08-31 NOTE — ED Provider Notes (Signed)
[facility]   CSN: [id number] Arrival date & time: 08/31/20  0050  An emergency department physician performed an initial assessment on this suspected stroke patient at 0054.  History Chief Complaint  Patient presents with  . Code Stroke    Thomas [name] is a 87 y.o. male.  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  Family reported to EMS that the patient was last seen normal at 11 PM tonight.  They noted right-sided weakness with right facial droop and speech difficulty.  Code stroke called in route. PT denies any complaints for me.  LEVEL 5 CAVEAT DUE TO MENTAL STATUS CHANGE  The history is provided by the patient and the EMS personnel.       Past Medical History:  Diagnosis Date  . Arthritis   . Clotting disorder ([name])   . Coronary artery disease   . Diabetes ([name])   . Hyperlipidemia   . Hypertension   . Hypothyroidism 07/17/2020  . Kidney disease   . Loss of hearing   . Macular degeneration   . Myocardial infarction ([name])   . Osteoarthritis   . Stroke [facility]([name])     Patient Active Problem List   Diagnosis Date Noted  . TIA (transient ischemic attack) 08/31/2020  . Normocytic anemia 08/31/2020  . Prolonged QT interval 08/31/2020  . Debility 08/21/2020  . Lytic bone lesion of femur 08/21/2020  . Sick sinus syndrome ([name]) 07/31/2020  . Coronary artery disease s/p CABG 2001 07/17/2020  . Hypothyroidism 07/17/2020  . Venous thromboembolism (VTE) 07/17/2020  . Hypertension 07/17/2020  . Cardiac pacemaker 07/17/2020  . Osteoarthritis 07/17/2020  . Type 2 diabetes mellitus with hyperglycemia ([name]) 07/17/2020  . Vitamin D deficiency 07/17/2020  . Macular degeneration 07/17/2020  . Dyslipidemia due to type 2 diabetes mellitus ([name]) 07/17/2020  . Insomnia 07/17/2020  . BPH (benign  prostatic hyperplasia) 07/17/2020  . Colon polyps 07/17/2020    Past Surgical History:  Procedure Laterality Date  . CARPAL TUNNEL RELEASE  2011  . CORONARY ANGIOPLASTY WITH STENT PLACEMENT    . CORONARY ARTERY BYPASS GRAFT  2001   x6  . REPLACEMENT TOTAL KNEE    . SPINE SURGERY  2008   cervical and lumbar  . THYROIDECTOMY         Family History  Problem Relation Age of Onset  . Stroke Mother   . Stroke Father   . Heart disease Sister   . Cancer Daughter   . Hypertension Son   . Heart disease Brother   . Cancer Brother   . Heart disease Brother     Social History   Tobacco Use  . Smoking status: Never Smoker  . Smokeless tobacco: Never Used  Substance Use Topics  . Alcohol use: Never  . Drug use: Never    Home Medications Prior to Admission medications   Medication Sig Start Date End Date Taking? Authorizing Provider  acetaminophen (TYLENOL) 325 MG tablet Take 650 mg by mouth every 6 (six) hours as needed for moderate pain or headache.   Yes [provider]  aspirin 81 MG chewable tablet Chew 81 mg by mouth daily.   Yes [provider]  b complex vitamins capsule Take 1 capsule by mouth daily.   Yes [provider]  carvedilol (COREG) 12.5 MG tablet Take 1 tablet (12.5 mg total) by mouth 2 (two) times  daily. 08/07/20 08/02/21 Yes Meriam Sprague, MD  cephALEXin (KEFLEX) 500 MG capsule Take 1 capsule (500 mg total) by mouth in the morning and at bedtime. 08/25/20  Yes Ardith Dark, MD  chlorthalidone (HYGROTON) 25 MG tablet Take 25 mg by mouth daily. 05/31/20  Yes [provider]  cholecalciferol (VITAMIN D3) 25 MCG (1000 UNIT) tablet Take 1,000 Units by mouth daily.   Yes [provider]  co-enzyme Q-10 50 MG capsule Take 50 mg by mouth daily.   Yes [provider]  glucosamine-chondroitin 500-400 MG tablet Take 1 tablet by mouth 3 (three) times daily.   Yes [provider]  insulin glargine (LANTUS)  100 unit/mL SOPN Inject 13 Units into the skin daily. Patient taking differently: Inject 13 Units into the skin at bedtime. 08/30/20  Yes Ardith Dark, MD  JANTOVEN 4 MG tablet Take 4 mg by mouth See admin instructions. Monday,Wednesday,Friday and sunday 07/09/20  Yes [provider]  levothyroxine (SYNTHROID) 100 MCG tablet Take 1 tablet (100 mcg total) by mouth daily. 08/19/20  Yes Ardith Dark, MD  mirtazapine (REMERON) 7.5 MG tablet Take 7.5 mg by mouth at bedtime. 07/11/20  Yes [provider]  Multiple Vitamin (MULTIVITAMIN) tablet Take 1 tablet by mouth daily.   Yes [provider]  ramipril (ALTACE) 10 MG capsule Take 10 mg by mouth daily. 05/23/20  Yes [provider]  Red Yeast Rice Extract (RED YEAST RICE PO) Take 1 capsule by mouth daily.   Yes [provider]  rosuvastatin (CRESTOR) 20 MG tablet Take 1 tablet (20 mg total) by mouth daily. Patient taking differently: Take 20 mg by mouth at bedtime. 07/21/20  Yes Meriam Sprague, MD  tamsulosin (FLOMAX) 0.4 MG CAPS capsule Take 0.4 mg by mouth daily after supper. 07/11/20  Yes [provider]  warfarin (COUMADIN) 3 MG tablet TAKE 1 TABLET BY MOUTH DAILY ON TUESDAY, THURSDAY AND SATURDAY Patient taking differently: Take 3 mg by mouth See admin instructions. Tuesday,Thursday, Saturday. 07/23/20  Yes Ardith Dark, MD    Allergies    Amitriptyline, Effexor xr [venlafaxine], Lyrica [pregabalin], Morphine, Morphine and related, Pravachol [pravastatin], and Zolpidem  Review of Systems   Review of Systems  Unable to perform ROS: Mental status change    Physical Exam Updated Vital Signs BP 112/63   Pulse 70   Resp (!) 22   Ht 5\' 8"  (1.727 m)   Wt 77.7 kg   SpO2 99%   BMI 26.05 kg/m   Physical Exam Vitals and nursing note reviewed.  Constitutional:      General: He is not in acute distress.    Appearance: He is well-developed.     Comments: Awake, alert  HENT:      Head: Normocephalic and atraumatic.  Eyes:     Extraocular Movements: Extraocular movements intact.     Conjunctiva/sclera: Conjunctivae normal.     Pupils: Pupils are equal, round, and reactive to light.  Cardiovascular:     Rate and Rhythm: Normal rate and regular rhythm.     Heart sounds: Normal heart sounds. No murmur heard.   Pulmonary:     Effort: Pulmonary effort is normal. No respiratory distress.     Breath sounds: Normal breath sounds.  Abdominal:     General: Bowel sounds are normal. There is no distension.     Palpations: Abdomen is soft.     Tenderness: There is no abdominal tenderness.  Musculoskeletal:  Cervical back: Neck supple.  Skin:    General: Skin is warm and dry.  Neurological:     Mental Status: He is alert and oriented to person, place, and time.     Cranial Nerves: No cranial nerve deficit.     Motor: No abnormal muscle tone.     Deep Tendon Reflexes: Reflexes are normal and symmetric.     Comments: Mild disorientation but able to follow commands, no obvious aphasia, no obvious facial droop, normal finger-to-nose testing, negative pronator drift, no clonus 5/5 strength and normal sensation x all 4 extremities  Psychiatric:        Attention and Perception: He is inattentive.        Mood and Affect: Affect is flat.     ED Results / Procedures / Treatments   Labs (all labs ordered are listed, but only abnormal results are displayed) Labs Reviewed  PROTIME-INR - Abnormal; Notable for the following components:      Result Value   Prothrombin Time 27.0 (*)    INR 2.6 (*)    All other components within normal limits  APTT - Abnormal; Notable for the following components:   aPTT 39 (*)    All other components within normal limits  CBC - Abnormal; Notable for the following components:   WBC 10.7 (*)    RBC 3.38 (*)    Hemoglobin 11.0 (*)    HCT 33.4 (*)    All other components within normal limits  COMPREHENSIVE METABOLIC PANEL - Abnormal;  Notable for the following components:   CO2 21 (*)    Glucose, Bld 214 (*)    BUN 30 (*)    Creatinine, Ser 1.37 (*)    Albumin 3.3 (*)    GFR, Estimated 49 (*)    All other components within normal limits  CBG MONITORING, ED - Abnormal; Notable for the following components:   Glucose-Capillary 199 (*)    All other components within normal limits  I-STAT CHEM 8, ED - Abnormal; Notable for the following components:   BUN 31 (*)    Creatinine, Ser 1.40 (*)    Glucose, Bld 208 (*)    TCO2 21 (*)    Hemoglobin 10.5 (*)    HCT 31.0 (*)    All other components within normal limits  SARS CORONAVIRUS 2 (TAT 6-24 HRS)  DIFFERENTIAL  HEMOGLOBIN A1C  LIPID PANEL  MAGNESIUM  CBG MONITORING, ED    EKG EKG Interpretation  Date/Time:  Sunday August 31 2020 01:15:40 EST Ventricular Rate:  72 PR Interval:    QRS Duration: 94 QT Interval:  436 QTC Calculation: 478 R Axis:   42 Text Interpretation: Sinus rhythm Borderline repolarization abnormality Borderline prolonged QT interval No significant change since last tracing Confirmed by Frederick Peers [phone]) on 08/31/2020 2:28:01 AM   Radiology CT HEAD CODE STROKE WO CONTRAST  Result Date: 08/31/2020 CLINICAL DATA:  Code stroke. Initial evaluation for acute right-sided deficits, right facial droop. EXAM: CT HEAD WITHOUT CONTRAST TECHNIQUE: Contiguous axial images were obtained from the base of the skull through the vertex without intravenous contrast. COMPARISON:  Previous MRI from 08/19/2020 FINDINGS: Brain: Generalized age-related cerebral atrophy with advanced chronic microvascular ischemic disease. Associated remote lacunar infarcts noted within the bilateral thalami. Additional small remote right temporal lobe infarct. There is new wedge-shaped parenchymal hypodensity involving the right occipital pole, consistent with an evolving acute to early subacute right PCA territory infarct. No associated hemorrhage or mass effect. No other visible  acute large vessel territory infarct. No intracranial hemorrhage elsewhere within the brain. No mass lesion, midline shift or mass effect. No hydrocephalus or extra-axial fluid collection. Vascular: No hyperdense vessel. Calcified atherosclerosis present at skull base. Skull: Scalp soft tissues and calvarium within normal limits. Sinuses/Orbits: Globes and orbital soft tissues demonstrate no acute finding. Scattered mucosal thickening noted within the ethmoidal air cells. Small right maxillary sinus retention cyst. No mastoid effusion. Other: None. ASPECTS [facility] Stroke Program Early CT Score) - Ganglionic level infarction (caudate, lentiform nuclei, internal capsule, insula, M1-M3 cortex): 7 - Supraganglionic infarction (M4-M6 cortex): 3 Total score (0-10 with 10 being normal): 10 IMPRESSION: 1. Evolving acute to early subacute right PCA distribution infarct, new as compared to recent MRI from 08/19/2020. No associated hemorrhage or mass effect. 2. No other acute intracranial abnormality. 3. ASPECTS is 10. 4. Underlying age-related cerebral atrophy with advanced chronic microvascular ischemic disease, with additional chronic right temporal lobe infarct. These results were communicated to Dr. Thomasena Edis at 1:18 amon 3/6/2022by text page via the [facility] messaging system. Electronically Signed   By: Rise Mu M.D.   On: 08/31/2020 01:21    Procedures Procedures   Medications Ordered in ED Medications  acetaminophen (TYLENOL) tablet 650 mg (has no administration in time range)    Or  acetaminophen (TYLENOL) suppository 650 mg (has no administration in time range)  prochlorperazine (COMPAZINE) injection 5 mg (has no administration in time range)  magnesium sulfate IVPB 2 g 50 mL (has no administration in time range)  sodium chloride flush (NS) 0.9 % injection 3 mL (3 mLs Intravenous Given 08/31/20 0125)   stroke: mapping our early stages of recovery book (1 each Does not apply Given 08/31/20 0342)     ED Course  I have reviewed the triage vital signs and the nursing notes.  Pertinent labs & imaging results that were available during my care of the patient were reviewed by me and considered in my medical decision making (see chart for details).    MDM Rules/Calculators/A&P                          Patient arrived as code stroke and immediately taken to CT scan where he was evaluated by neurology team, Dr. Thomasena Edis.  Head CT showing acute to subacute acute right PCA infarct but this does not necessarily correspond to any of the symptoms seen tonight.  Currently his neurologic exam is reassuring.  Patient not TPA candidate.  Dr. Thomasena Edis recommended admission for work-up.  Discussed admission with [facility] hospitalist, Dr. Robb Matar. Final Clinical Impression(s) / ED Diagnoses Final diagnoses:  None    Rx / DC Orders ED Discharge Orders    None       Terica Yogi, Ambrose [place], MD 08/31/20 [phone]

## 2020-08-31 NOTE — Progress Notes (Signed)
Occupational Therapy Evaluation Patient Details Name: Thomas [name] MRN: [id number] DOB: 08-28-1928 Today's Date: 08/31/2020    History of Present Illness Pt is an 87 y/o male admitted 3/6 secondary to worsening R sided weakness. Workup pending, but CT showed acute to early subacute right PCA distribution. PMH includes CVA, CAD s/p CABG, HTN, macular degeneration, VTE, and DM.   Clinical Impression   Pt admitted with above. He demonstrates the below listed deficits and will benefit from continued OT to maximize safety and independence with BADLs.  Pt presents to OT with generalized weakness, decreased activity tolerance, impaired balance, and visual deficits (h/o macular degeneration).  He currently requires min - mod A for ADLs, and min A for sit to stand, and side stepping up Edge of bed.  Eval significantly limited due to pt with symptomatic orthostasis.  Pt lives with wife, and was able to perform most of his ADLs without assist (she assisted with donning socks).   Anticipate once BP stabilizes, he will progress well, and will be able to progress home with wife and HH therapies; however, if he doesn't progress as expected, he may require a short SNF stay.      08/31/20 0900  Orthostatic Lying   BP- Lying 127/72  Orthostatic Sitting  BP- Sitting 137/83  Orthostatic Standing at 0 minutes  BP- Standing at 0 minutes 111/59  Orthostatic Standing at 3 minutes  BP- Standing at 3 minutes 98/58         Follow Up Recommendations  Home health OT;SNF (if he does not progress as expected);Supervision/Assistance - 24 hour    Equipment Recommendations  None recommended by OT    Recommendations for Other Services       Precautions / Restrictions Precautions Precautions: Fall;Other (comment) Precaution Comments: Watch BP - orthostatic Restrictions Weight Bearing Restrictions: No      Mobility Bed Mobility Overal bed mobility: Needs Assistance Bed Mobility: Supine to Sit;Sit to  Supine     Supine to sit: Min assist Sit to supine: Mod assist;+2 for physical assistance   General bed mobility comments: Min A For trunk elevation to come to sitting. Mod A +2 to return to supine for LE and trunk assist given +orthostasis.    Transfers Overall transfer level: Needs assistance Equipment used: Rolling walker (2 wheeled) Transfers: Sit to/from Stand Sit to Stand: Min guard;Min assist         General transfer comment: Min guard to min A to stand using RW. Had pt stand for prolonged period for orthostatic testing and pt reporting R sided deficits worsening. Orthostatics positive, so returned to sitting and then supine. Symptoms improved once in supine.    Balance Overall balance assessment: Needs assistance Sitting-balance support: Feet supported;No upper extremity supported       Standing balance support: Bilateral upper extremity supported;During functional activity Standing balance-Leahy Scale: Poor Standing balance comment: reliant on BUE                           ADL either performed or assessed with clinical judgement   ADL Overall ADL's : Needs assistance/impaired Eating/Feeding: Set up;Bed level   Grooming: Wash/dry hands;Wash/dry face;Oral care;Brushing hair;Min guard;Standing   Upper Body Bathing: Supervision/ safety;Set up;Sitting   Lower Body Bathing: Sit to/from stand;Moderate assistance Lower Body Bathing Details (indicate cue type and reason): wife assists with socks at baseline Upper Body Dressing : Set up;Sitting   Lower Body Dressing: Sit to/from stand;Moderate assistance  Toilet Transfer: Stand-pivot;BSC;Minimal assistance   Toileting- Clothing Manipulation and Hygiene: Sit to/from stand;Moderate assistance       Functional mobility during ADLs: Minimal assistance;+2 for safety/equipment General ADL Comments: activity limited by orthostatic hypotension     Vision Baseline Vision/History: Macular  Degeneration Additional Comments: Pt with droopy eyelid on the Rt which wife reports is baseline.  He has h/o macular degeneration for which he takes injections.  He denies visual changes, but vision not assessed this dated due to symptomatic orthostatic hypotension and pt difficulty fully participating     Perception Perception Perception Tested?: Yes   Praxis Praxis Praxis tested?: Within functional limits    Pertinent Vitals/Pain Pain Assessment: No/denies pain     Hand Dominance Right   Extremity/Trunk Assessment Upper Extremity Assessment Upper Extremity Assessment: Generalized weakness   Lower Extremity Assessment Lower Extremity Assessment: Defer to PT evaluation RLE Deficits / Details: With prolonged standing, and drop in BP, pt with increased weakness and difficulty stepping with RLE.   Cervical / Trunk Assessment Cervical / Trunk Assessment: Normal   Communication Communication Communication: HOH   Cognition Arousal/Alertness: Awake/alert Behavior During Therapy: WFL for tasks assessed/performed Overall Cognitive Status: Within Functional Limits for tasks assessed                                 General Comments: grossly assessed   General Comments  wife present during session    Exercises     Shoulder Instructions      Home Living Family/patient expects to be discharged to:: Private residence Living Arrangements: Spouse/significant other Available Help at Discharge: Family;Available 24 hours/day Type of Home: House Home Access: Stairs to enter [employer] of Steps: 1 (stoop step) Entrance Stairs-Rails: None Home Layout: One level     Bathroom Shower/Tub: Producer, television/film/video: Handicapped height     Home Equipment: Environmental consultant - 2 wheels;Shower seat;Wheelchair - manual          Prior Functioning/Environment Level of Independence: Needs assistance  Gait / Transfers Assistance Needed: Uses WC when he goes out.  Uses RW for household distances ADL's / Homemaking Assistance Needed: Reports independence with bathing/dressing   Comments: pt nor wife drive        OT Problem List: Decreased strength;Decreased activity tolerance;Impaired balance (sitting and/or standing);Impaired vision/perception;Decreased safety awareness;Decreased knowledge of use of DME or AE      OT Treatment/Interventions: Self-care/ADL training;DME and/or AE instruction;Neuromuscular education;Therapeutic activities;Visual/perceptual remediation/compensation;Patient/family education;Balance training    OT Goals(Current goals can be found in the care plan section) Acute Rehab OT Goals Patient Stated Goal: to feel better OT Goal Formulation: With patient/family Time For Goal Achievement: 09/14/20 Potential to Achieve Goals: Good ADL Goals Pt Will Perform Grooming: (P) with min guard assist;standing Pt Will Perform Upper Body Bathing: (P) with set-up;sitting Pt Will Perform Lower Body Bathing: (P) with min guard assist;sit to/from stand Pt Will Perform Lower Body Dressing: (P) with min guard assist;sit to/from stand Pt Will Transfer to Toilet: (P) with min guard assist;ambulating;regular height toilet;bedside commode;grab bars Pt Will Perform Toileting - Clothing Manipulation and hygiene: (P) with min guard assist;sit to/from stand  OT Frequency: Min 2X/week   Barriers to D/C:            Co-evaluation PT/OT/SLP Co-Evaluation/Treatment: Yes Reason for Co-Treatment: To address functional/ADL transfers;For patient/therapist safety PT goals addressed during session: Mobility/safety with mobility;Balance;Proper use of DME OT goals addressed during session:  ADL's and self-care      AM-PAC OT "6 Clicks" Daily Activity     Outcome Measure Help from another person eating meals?: A Little Help from another person taking care of personal grooming?: A Little Help from another person toileting, which includes using toliet,  bedpan, or urinal?: A Lot Help from another person bathing (including washing, rinsing, drying)?: A Little Help from another person to put on and taking off regular upper body clothing?: A Little Help from another person to put on and taking off regular lower body clothing?: A Lot 6 Click Score: 16   End of Session Equipment Utilized During Treatment: Gait belt Nurse Communication: Mobility status;Other (comment) (orthostasis)  Activity Tolerance: Treatment limited secondary to medical complications (Comment) (orthostatic hypotension) Patient left: in bed;with call bell/phone within reach;with family/visitor present  OT Visit Diagnosis: Unsteadiness on feet (R26.81)                Time: 3664-4034 OT Time Calculation (min): 34 min Charges:  OT General Charges $OT Visit: 1 Visit OT Evaluation $OT Eval Moderate Complexity: 1 Mod  Eber Jones., OTR/L Acute Rehabilitation Services Pager [phone] Office [phone]   Jeani Hawking M 08/31/2020, 10:46 AM

## 2020-08-31 NOTE — Progress Notes (Signed)
ANTICOAGULATION CONSULT NOTE - Initial Consult  Pharmacy Consult for coumadin Indication: pulmonary embolus  Allergies  Allergen Reactions  . Amitriptyline   . Effexor Xr [Venlafaxine]   . Lyrica [Pregabalin]   . Morphine   . Morphine And Related   . Pravachol [Pravastatin]   . Zolpidem     Patient Measurements: Height: 5\' 8"  (172.7 cm) Weight: 77.7 kg (171 lb 4.8 oz) IBW/kg (Calculated) : 68.4   Vital Signs: BP: 113/65 (03/06 0500) Pulse Rate: 70 (03/06 0500)  Labs: Recent Labs    08/31/20 0059 08/31/20 0101  HGB 11.0* 10.5*  HCT 33.4* 31.0*  PLT 194  --   APTT 39*  --   LABPROT 27.0*  --   INR 2.6*  --   CREATININE 1.37* 1.40*    Estimated Creatinine Clearance: 34.6 mL/min (A) (by C-G formula based on SCr of 1.4 mg/dL (H)).   Medical History: Past Medical History:  Diagnosis Date  . Arthritis   . Clotting disorder ([name])   . Coronary artery disease   . Diabetes ([name])   . Hyperlipidemia   . Hypertension   . Hypothyroidism 07/17/2020  . Kidney disease   . Loss of hearing   . Macular degeneration   . Myocardial infarction ([name])   . Osteoarthritis   . Stroke [facility])     Medications:  See medication history  Assessment: 87 yo man to continue coumadin for h/o PE.  INR 2.6, Hg 10.5, PTLC 194 Last dose coumadin on 3/5 Goal of Therapy:  INR 2-3 Monitor platelets by anticoagulation protocol: Yes   Plan:  Continue home dose coumadin 3mg  po q T,Th,Sat and 4mg  MWFSu.   Check protime daily Monitor for bleeding complications  Temple Ewart Poteet 08/31/2020,5:59 AM

## 2020-08-31 NOTE — H&P (Signed)
History and Physical    Thomas [name] ZOX:[id number] DOB: 1928-12-15 DOA: 08/31/2020  PCP: Ardith DarkParker, Caleb M, MD  Patient coming from: Home.  I have personally briefly [facility]  Chief Complaint: Right-sided weakness.  HPI: Thomas [name] is a 87 y.o. male with medical history significant of osteoarthritis of multiple sites, history of DVT, CAD/CABG x6, history of angioplasty with stent placement type 2 diabetes, hyperlipidemia, hypertension, hypothyroidism, kidney disease, hearing impairment, macular degeneration, history of other nonhemorrhagic stroke who is brought to the emergency department via EMS due to right-sided weakness with right-sided facial droop and aphasia who was last seen at his baseline at 2300.  EMS also described that the patient was hypotensive initially 80/50 mmHg and then 106/66 mmHg.  Code stroke was called in route.  His symptoms were improved by the time the patient arrived to the ED.  No headache or blurred vision.  No fever, chills, sore throat, rhinorrhea, dyspnea, chest pain, palpitations, diaphoresis, PND, orthopnea, recent pitting edema of the lower extremities.  No abdominal pain, nausea, emesis, diarrhea or constipation.  ED Course: Initial vital signs were temperature --, Pulse 70, respirations 22, BP 119/66 mmHg O2 sat 99% on room air.  Labwork: CBC showed a white count of 10.7, hemoglobin 11.0 g/dL and platelets [id number].  PT 27.0, INR 2.6 and PTT 39.  CMP showed a CO2 level of 21 mmol/L, all other electrolytes are within normal range.  Glucose 214, BUN 30 and creatinine 1.37 mg/dL.  LFTs were normal, except for mildly decreased albumin at 3.3 g/dL.  Imaging: CT head code stroke without contrast show any evolving acute to early subacute right PCA distribution infarct, new as compared to recent MRI from 08/19/2020.  Review of Systems: As per HPI otherwise all other systems reviewed and are negative.  Past Medical  History:  Diagnosis Date  . Arthritis   . Clotting disorder ([name])   . Coronary artery disease   . Diabetes ([name])   . Hyperlipidemia   . Hypertension   . Hypothyroidism 07/17/2020  . Kidney disease   . Loss of hearing   . Macular degeneration   . Myocardial infarction ([name])   . Osteoarthritis   . Stroke [facility]([name])    Past Surgical History:  Procedure Laterality Date  . CARPAL TUNNEL RELEASE  2011  . CORONARY ANGIOPLASTY WITH STENT PLACEMENT    . CORONARY ARTERY BYPASS GRAFT  2001   x6  . REPLACEMENT TOTAL KNEE    . SPINE SURGERY  2008   cervical and lumbar  . THYROIDECTOMY     Social History  reports that he has never smoked. He has never used smokeless tobacco. He reports that he does not drink alcohol and does not use drugs.  Allergies  Allergen Reactions  . Amitriptyline   . Effexor Xr [Venlafaxine]   . Lyrica [Pregabalin]   . Morphine   . Morphine And Related   . Pravachol [Pravastatin]   . Zolpidem    Family History  Problem Relation Age of Onset  . Stroke Mother   . Stroke Father   . Heart disease Sister   . Cancer Daughter   . Hypertension Son   . Heart disease Brother   . Cancer Brother   . Heart disease Brother    Prior to Admission medications   Medication Sig Start Date End Date Taking? Authorizing Provider  acetaminophen (TYLENOL) 325 MG tablet Take 650 mg by mouth every 6 (six) hours as  needed for moderate pain or headache.   Yes [provider]  aspirin 81 MG chewable tablet Chew 81 mg by mouth daily.   Yes [provider]  b complex vitamins capsule Take 1 capsule by mouth daily.   Yes [provider]  carvedilol (COREG) 12.5 MG tablet Take 1 tablet (12.5 mg total) by mouth 2 (two) times daily. 08/07/20 08/02/21 Yes Meriam Sprague, MD  cephALEXin (KEFLEX) 500 MG capsule Take 1 capsule (500 mg total) by mouth in the morning and at bedtime. 08/25/20  Yes Ardith Dark, MD  chlorthalidone (HYGROTON) 25 MG tablet Take 25 mg  by mouth daily. 05/31/20  Yes [provider]  cholecalciferol (VITAMIN D3) 25 MCG (1000 UNIT) tablet Take 1,000 Units by mouth daily.   Yes [provider]  co-enzyme Q-10 50 MG capsule Take 50 mg by mouth daily.   Yes [provider]  glucosamine-chondroitin 500-400 MG tablet Take 1 tablet by mouth 3 (three) times daily.   Yes [provider]  insulin glargine (LANTUS) 100 unit/mL SOPN Inject 13 Units into the skin daily. Patient taking differently: Inject 13 Units into the skin at bedtime. 08/30/20  Yes Ardith Dark, MD  JANTOVEN 4 MG tablet Take 4 mg by mouth See admin instructions. Monday,Wednesday,Friday and sunday 07/09/20  Yes [provider]  levothyroxine (SYNTHROID) 100 MCG tablet Take 1 tablet (100 mcg total) by mouth daily. 08/19/20  Yes Ardith Dark, MD  mirtazapine (REMERON) 7.5 MG tablet Take 7.5 mg by mouth at bedtime. 07/11/20  Yes [provider]  Multiple Vitamin (MULTIVITAMIN) tablet Take 1 tablet by mouth daily.   Yes [provider]  ramipril (ALTACE) 10 MG capsule Take 10 mg by mouth daily. 05/23/20  Yes [provider]  Red Yeast Rice Extract (RED YEAST RICE PO) Take 1 capsule by mouth daily.   Yes [provider]  rosuvastatin (CRESTOR) 20 MG tablet Take 1 tablet (20 mg total) by mouth daily. Patient taking differently: Take 20 mg by mouth at bedtime. 07/21/20  Yes Meriam Sprague, MD  tamsulosin (FLOMAX) 0.4 MG CAPS capsule Take 0.4 mg by mouth daily after supper. 07/11/20  Yes [provider]  warfarin (COUMADIN) 3 MG tablet TAKE 1 TABLET BY MOUTH DAILY ON TUESDAY, THURSDAY AND SATURDAY Patient taking differently: Take 3 mg by mouth See admin instructions. Tuesday,Thursday, Saturday. 07/23/20  Yes Ardith Dark, MD   Physical Exam: Vitals:   08/31/20 0000 08/31/20 0130 08/31/20 0200 08/31/20 0215  BP:  119/66 (!) 95/56 112/63  Pulse:  70 69 70  Resp:  (!) 22 19 (!) 22   SpO2:  99% 96% 99%  Weight: 77.7 kg     Height: 5\' 8"  (1.727 m)      Constitutional: Frail, early male.  In NAD. Eyes: PERRL, lids and conjunctivae normal ENMT: Mucous membranes are mildly dry. Posterior pharynx clear of any exudate or lesions. Neck: normal, supple, no masses, no thyromegaly Respiratory: clear to auscultation bilaterally, no wheezing, no crackles. Normal respiratory effort. No accessory muscle use.  Cardiovascular: Regular rate and rhythm, no murmurs / rubs / gallops. No extremity edema. 2+ pedal pulses. No carotid bruits.  Abdomen: No distention.  Bowel sounds positive.  Soft, no tenderness, no masses palpated. No hepatosplenomegaly.  Musculoskeletal: Mild generalized weakness.  No clubbing / cyanosis.  Good ROM, no contractures. Normal muscle tone.  Skin: Some areas of ecchymosis on extremities. Neurologic:  CN 2-12 grossly intact. Sensation  intact, DTR normal. Mild intention tremor.  Symmetric hand grip.  No pronator drift.  Good coordination on nose to finger test. Strength seems to be symmetric in all 4.  Psychiatric: Alert and oriented x 2, partially oriented to time/date and situation.  Labs on Admission: I have personally reviewed following labs and imaging studies  CBC: Recent Labs  Lab 08/27/20 1526 08/31/20 0059 08/31/20 0101  WBC 10.8* 10.7*  --   NEUTROABS 5.8 6.4  --   HGB 11.1* 11.0* 10.5*  HCT 33.6* 33.4* 31.0*  MCV 97.1 98.8  --   PLT 213 194  --     Basic Metabolic Panel: Recent Labs  Lab 08/27/20 1526 08/31/20 0059 08/31/20 0101  NA 136 135 138  K 4.7 4.4 4.4  CL 108 104 103  CO2 20* 21*  --   GLUCOSE 149* 214* 208*  BUN 23 30* 31*  CREATININE 1.16 1.37* 1.40*  CALCIUM 8.8* 8.9  --     GFR: Estimated Creatinine Clearance: 34.6 mL/min (A) (by C-G formula based on SCr of 1.4 mg/dL (H)).  Liver Function Tests: Recent Labs  Lab 08/27/20 1526 08/31/20 0059  AST 25 29  ALT 29 36  ALKPHOS 56 41  BILITOT 0.2* 0.5  PROT 7.6 6.5   ALBUMIN 3.8 3.3*   Radiological Exams on Admission: CT HEAD CODE STROKE WO CONTRAST  Result Date: 08/31/2020 CLINICAL DATA:  Code stroke. Initial evaluation for acute right-sided deficits, right facial droop. EXAM: CT HEAD WITHOUT CONTRAST TECHNIQUE: Contiguous axial images were obtained from the base of the skull through the vertex without intravenous contrast. COMPARISON:  Previous MRI from 08/19/2020 FINDINGS: Brain: Generalized age-related cerebral atrophy with advanced chronic microvascular ischemic disease. Associated remote lacunar infarcts noted within the bilateral thalami. Additional small remote right temporal lobe infarct. There is new wedge-shaped parenchymal hypodensity involving the right occipital pole, consistent with an evolving acute to early subacute right PCA territory infarct. No associated hemorrhage or mass effect. No other visible acute large vessel territory infarct. No intracranial hemorrhage elsewhere within the brain. No mass lesion, midline shift or mass effect. No hydrocephalus or extra-axial fluid collection. Vascular: No hyperdense vessel. Calcified atherosclerosis present at skull base. Skull: Scalp soft tissues and calvarium within normal limits. Sinuses/Orbits: Globes and orbital soft tissues demonstrate no acute finding. Scattered mucosal thickening noted within the ethmoidal air cells. Small right maxillary sinus retention cyst. No mastoid effusion. Other: None. ASPECTS [facility] Stroke Program Early CT Score) - Ganglionic level infarction (caudate, lentiform nuclei, internal capsule, insula, M1-M3 cortex): 7 - Supraganglionic infarction (M4-M6 cortex): 3 Total score (0-10 with 10 being normal): 10 IMPRESSION: 1. Evolving acute to early subacute right PCA distribution infarct, new as compared to recent MRI from 08/19/2020. No associated hemorrhage or mass effect. 2. No other acute intracranial abnormality. 3. ASPECTS is 10. 4. Underlying age-related cerebral atrophy with  advanced chronic microvascular ischemic disease, with additional chronic right temporal lobe infarct. These results were communicated to Dr. Thomasena Edis at 1:18 amon 3/6/2022by text page via the [facility] messaging system. Electronically Signed   By: Rise Mu M.D.   On: 08/31/2020 01:21    EKG: Independently reviewed.  Vent. rate 72 BPM PR interval * ms QRS duration 94 ms QT/QTc 436/478 ms P-R-T axes -8 42 116 Sinus rhythm Borderline repolarization abnormality Borderline prolonged QT interval  Assessment/Plan Principal Problem:   TIA (transient ischemic attack) Observation/telemetry. Frequent neurochecks. PT/OT/SLP. Check fasting lipids. Check hemoglobin A1c. Continue aspirin. Also on warfarin. Check MRI/MRA  head and neck. Neuro hospitalist team is following.  Active Problems:   Coronary artery disease s/p CABG 2001 On carvedilol, aspirin and statin.    Hypothyroidism Continue levothyroxine 100 mcg p.o. daily.    Venous thromboembolism (VTE) No need to hold [facility] per neurology. Continue warfarin per pharmacy.    Hypertension Held meds, pending neuro evaluation.    Type 2 diabetes mellitus with hyperglycemia ([name]) Carbohydrate modified diet. Continue Lantus 13 units SQ daily. CBG monitoring with RI SS. Check hemoglobin A1c.    Dyslipidemia due to type 2 diabetes mellitus ([name])  Check fasting lipids. Continue rosuvastatin 20 mg p.o. daily.    BPH (benign prostatic hyperplasia) Continue tamsulosin 0.4 mg p.o. daily.    Normocytic anemia Monitor H&H.    DVT prophylaxis: On warfarin. Code Status:   Full code. Family Communication:  His wife was by bedside. Disposition Plan:   Patient is from:  Home.  Anticipated DC to:  Home.  Anticipated DC date:  09/01/2020.  Anticipated DC barriers: Clinical status and pending work-up.  Consults called:  Neuro hospitalist team Marisue Humble, MD). Admission status:  Observation/telemetry.  Severity of Illness:   High due to presenting with neuro deficits and encephalopathic features secondary to TIA.  The patient also seems to have a superacute stroke and will need to remain for close neurological monitoring and further work-up.  Bobette Mo MD [facility]  How to contact the [facility] Attending or Consulting provider 7A - 7P or covering provider during after hours 7P -7A, for this patient?   1. Check the care team in [facility] and look for a) attending/consulting [facility] provider listed and b) the [facility] team listed 2. Log into [email or web address] and use [place]'s universal password to access. If you do not have the password, please contact the hospital operator. 3. Locate the [facility] provider you are looking for under [facility] and page to a number that you can be directly reached. 4. If you still have difficulty reaching the provider, please page the [facility] (Director on Call) for the Hospitalists listed on amion for assistance.  08/31/2020, 3:54 AM   This document was prepared using Dragon voice recognition software and may contain some unintended transcription errors.

## 2020-08-31 NOTE — Evaluation (Addendum)
Physical Therapy Evaluation Patient Details Name: Thomas [name] MRN: [id number] DOB: 02/18/1929 Today's Date: 08/31/2020   History of Present Illness  Pt is an 87 y/o male admitted 3/6 secondary to worsening R sided weakness. Workup pending, but CT showed acute to early subacute right PCA distribution. PMH includes CVA, CAD s/p CABG, HTN, macular degeneration, VTE, and DM.  Clinical Impression  Pt admitted secondary to problem above with deficits below. Pt requiring min-mod A for bed mobility and min to min guard  A +2 for transfers. Pt reporting dizziness and increased weakness in RUE and RLE with prolonged standing and with +orthostatics. Pt from home with wife, but normally able to ambulate at a mod I level with RW. If pt progresses well with improved BP, will likely be able to d/c home with HHPT and assist from family. However, if pt does not progress well, will need to consider SNF level therapies. Will continue to follow acutely to progress mobility as pt tolerates and update recommendations based on progression.     08/31/20 0900  Orthostatic Lying   BP- Lying 127/72  Orthostatic Sitting  BP- Sitting 137/83  Orthostatic Standing at 0 minutes  BP- Standing at 0 minutes 111/59  Orthostatic Standing at 3 minutes  BP- Standing at 3 minutes 98/58      Follow Up Recommendations Home health PT;Supervision/Assistance - 24 hour (vs SNF pending progression)    Equipment Recommendations  None recommended by PT    Recommendations for Other Services       Precautions / Restrictions Precautions Precautions: Fall;Other (comment) Precaution Comments: Watch BP Restrictions Weight Bearing Restrictions: No      Mobility  Bed Mobility Overal bed mobility: Needs Assistance Bed Mobility: Supine to Sit;Sit to Supine     Supine to sit: Min assist Sit to supine: Mod assist;+2 for physical assistance   General bed mobility comments: Min A For trunk elevation to come to sitting. Mod A +2  to return to supine for LE and trunk assist given +orthostasis.    Transfers Overall transfer level: Needs assistance Equipment used: Rolling walker (2 wheeled) Transfers: Sit to/from Stand Sit to Stand: Min guard;Min assist         General transfer comment: Min guard to min A to stand using RW. Had pt stand for prolonged period for orthostatic testing and pt reporting R sided deficits worsening. Orthostatics positive, so returned to sitting and then supine. Symptoms improved once in supine.  Ambulation/Gait             General Gait Details: unable secondary to +orthostatics.  Stairs            Wheelchair Mobility    Modified Rankin (Stroke Patients Only) Modified Rankin (Stroke Patients Only) Pre-Morbid Rankin Score: No symptoms Modified Rankin: Moderately severe disability     Balance Overall balance assessment: Needs assistance Sitting-balance support: Feet supported;No upper extremity supported       Standing balance support: Bilateral upper extremity supported;During functional activity Standing balance-Leahy Scale: Poor Standing balance comment: reliant on BUE                             Pertinent Vitals/Pain Pain Assessment: No/denies pain    Home Living Family/patient expects to be discharged to:: Private residence Living Arrangements: Spouse/significant other Available Help at Discharge: Family;Available 24 hours/day Type of Home: House Home Access: Stairs to enter Entrance Stairs-Rails: None Entrance Stairs-Number of Steps: 1 (stoop step) Home  Layout: One level Home Equipment: Walker - 2 wheels;Shower seat;Wheelchair - manual      Prior Function Level of Independence: Needs assistance   Gait / Transfers Assistance Needed: Uses WC when he goes out. Uses RW for household distances  ADL's / Homemaking Assistance Needed: Reports independence with bathing/dressing        Hand Dominance        Extremity/Trunk Assessment    Upper Extremity Assessment Upper Extremity Assessment: Defer to OT evaluation    Lower Extremity Assessment Lower Extremity Assessment: RLE deficits/detail RLE Deficits / Details: With prolonged standing, and drop in BP, pt with increased weakness and difficulty stepping with RLE.    Cervical / Trunk Assessment Cervical / Trunk Assessment: Normal  Communication   Communication: HOH  Cognition Arousal/Alertness: Awake/alert Behavior During Therapy: WFL for tasks assessed/performed Overall Cognitive Status: Within Functional Limits for tasks assessed                                        General Comments General comments (skin integrity, edema, etc.): Pt's wife present during session    Exercises     Assessment/Plan    PT Assessment Patient needs continued PT services  PT Problem List Decreased strength;Decreased balance;Decreased activity tolerance;Decreased mobility;Decreased knowledge of use of DME;Decreased safety awareness;Decreased knowledge of precautions       PT Treatment Interventions Gait training;DME instruction;Stair training;Functional mobility training;Therapeutic activities;Therapeutic exercise;Balance training;Patient/family education    PT Goals (Current goals can be found in the Care Plan section)  Acute Rehab PT Goals Patient Stated Goal: to feel better PT Goal Formulation: With patient Time For Goal Achievement: 09/14/20 Potential to Achieve Goals: Good    Frequency Min 3X/week   Barriers to discharge        Co-evaluation PT/OT/SLP Co-Evaluation/Treatment: Yes Reason for Co-Treatment: For patient/therapist safety;To address functional/ADL transfers PT goals addressed during session: Mobility/safety with mobility;Balance;Proper use of DME         AM-PAC PT "6 Clicks" Mobility  Outcome Measure Help needed turning from your back to your side while in a flat bed without using bedrails?: A Little Help needed moving from lying on  your back to sitting on the side of a flat bed without using bedrails?: A Little Help needed moving to and from a bed to a chair (including a wheelchair)?: A Little Help needed standing up from a chair using your arms (e.g., wheelchair or bedside chair)?: A Little Help needed to walk in hospital room?: A Lot Help needed climbing 3-5 steps with a railing? : Total 6 Click Score: 15    End of Session Equipment Utilized During Treatment: Gait belt Activity Tolerance: Treatment limited secondary to medical complications (Comment) (+ orthostatics) Patient left: in bed;with call bell/phone within reach;with family/visitor present (on stretcher in ED) Nurse Communication: Mobility status PT Visit Diagnosis: Unsteadiness on feet (R26.81);Muscle weakness (generalized) (M62.81)    Time: 4627-0350 PT Time Calculation (min) (ACUTE ONLY): 36 min   Charges:   PT Evaluation $PT Eval Moderate Complexity: 1 Mod          Farley Ly, PT, DPT  Acute Rehabilitation Services  Pager: [phone] Office: [phone]   Lehman Prom 08/31/2020, 10:21 AM

## 2020-08-31 NOTE — ED Notes (Signed)
Dinner Tray Ordered @ 1654. 

## 2020-08-31 NOTE — ED Notes (Signed)
Breakfast tray provided. Pt repositioned

## 2020-08-31 NOTE — ED Notes (Addendum)
NIHSS complete by Mindy, stroke RN.

## 2020-08-31 NOTE — Code Documentation (Signed)
Responded to Code Stroke call at 0027 for R sided deficits, R facial droop, and aphasia, [id number]. Pt arrived at 0050, NIH-5, CBG-199, CT head negative for acute changes. TPA not given-pt on coumadin(INR-2.6). Plan to admit for stroke work-up.

## 2020-09-01 ENCOUNTER — Inpatient Hospital Stay (HOSPITAL_COMMUNITY): Payer: Medicare Other

## 2020-09-01 LAB — CBC
HCT: 30.7 % — ABNORMAL LOW (ref 39.0–52.0)
Hemoglobin: 10.4 g/dL — ABNORMAL LOW (ref 13.0–17.0)
MCH: 32.8 pg (ref 26.0–34.0)
MCHC: 33.9 g/dL (ref 30.0–36.0)
MCV: 96.8 fL (ref 80.0–100.0)
Platelets: 184 10*3/uL (ref 150–400)
RBC: 3.17 MIL/uL — ABNORMAL LOW (ref 4.22–5.81)
RDW: 12.9 % (ref 11.5–15.5)
WBC: 10.1 10*3/uL (ref 4.0–10.5)
nRBC: 0 % (ref 0.0–0.2)

## 2020-09-01 LAB — UPEP/UIFE/LIGHT CHAINS/TP, 24-HR UR
% BETA, Urine: 0 %
ALPHA 1 URINE: 0 %
Albumin, U: 100 %
Alpha 2, Urine: 0 %
Free Kappa Lt Chains,Ur: 11.19 mg/L (ref 0.63–113.79)
Free Kappa/Lambda Ratio: 6.66 (ref 1.03–31.76)
Free Lambda Lt Chains,Ur: 1.68 mg/L (ref 0.47–11.77)
GAMMA GLOBULIN URINE: 0 %
Total Protein, Urine-Ur/day: 135 mg/24 hr (ref 30–150)
Total Protein, Urine: 7.5 mg/dL
Total Volume: 1800

## 2020-09-01 LAB — BASIC METABOLIC PANEL
Anion gap: 8 (ref 5–15)
BUN: 26 mg/dL — ABNORMAL HIGH (ref 8–23)
CO2: 22 mmol/L (ref 22–32)
Calcium: 9 mg/dL (ref 8.9–10.3)
Chloride: 106 mmol/L (ref 98–111)
Creatinine, Ser: 1.21 mg/dL (ref 0.61–1.24)
GFR, Estimated: 57 mL/min — ABNORMAL LOW (ref >60)
Glucose, Bld: 180 mg/dL — ABNORMAL HIGH (ref 70–99)
Potassium: 4.8 mmol/L (ref 3.5–5.1)
Sodium: 136 mmol/L (ref 135–145)

## 2020-09-01 LAB — PROTIME-INR
INR: 3.3 — ABNORMAL HIGH (ref 0.8–1.2)
Prothrombin Time: 32.7 seconds — ABNORMAL HIGH (ref 11.4–15.2)

## 2020-09-01 LAB — CORTISOL: Cortisol, Plasma: 6.1 ug/dL

## 2020-09-01 LAB — MAGNESIUM: Magnesium: 2 mg/dL (ref 1.7–2.4)

## 2020-09-01 MED ORDER — ADULT MULTIVITAMIN W/MINERALS CH
1.0000 | ORAL_TABLET | Freq: Every day | ORAL | Status: DC
  Administered 2020-09-02 – 2020-09-08 (×7): 1 via ORAL
  Filled 2020-09-01 (×7): qty 1

## 2020-09-01 MED ORDER — SODIUM CHLORIDE 0.9 % IV SOLN
2.0000 g | INTRAVENOUS | Status: AC
  Administered 2020-09-01 – 2020-09-05 (×5): 2 g via INTRAVENOUS
  Filled 2020-09-01 (×6): qty 2

## 2020-09-01 MED ORDER — ENSURE ENLIVE PO LIQD
237.0000 mL | Freq: Two times a day (BID) | ORAL | Status: DC
  Administered 2020-09-01 – 2020-09-08 (×15): 237 mL via ORAL

## 2020-09-01 NOTE — Progress Notes (Signed)
ANTICOAGULATION CONSULT NOTE - Initial Consult  Pharmacy Consult for coumadin Indication: pulmonary embolus  Allergies  Allergen Reactions  . Amitriptyline   . Effexor Xr [Venlafaxine]   . Lyrica [Pregabalin]   . Morphine   . Morphine And Related   . Pravachol [Pravastatin]   . Zolpidem     Patient Measurements: Height: 5\' 8"  (172.7 cm) Weight: 77.7 kg (171 lb 4.8 oz) IBW/kg (Calculated) : 68.4   Vital Signs: Temp: 98 F (36.7 C) (03/07 0401) Temp Source: Oral (03/07 0401) BP: 127/66 (03/07 0401) Pulse Rate: 68 (03/07 0401)  Labs: Recent Labs    08/31/20 0059 08/31/20 0101 09/01/20 0337  HGB 11.0* 10.5* 10.4*  HCT 33.4* 31.0* 30.7*  PLT 194  --  184  APTT 39*  --   --   LABPROT 27.0*  --  32.7*  INR 2.6*  --  3.3*  CREATININE 1.37* 1.40* 1.21    Estimated Creatinine Clearance: 40 mL/min (by C-G formula based on SCr of 1.21 mg/dL).   Medical History: Past Medical History:  Diagnosis Date  . Arthritis   . Clotting disorder ([name])   . Coronary artery disease   . Diabetes ([name])   . Hyperlipidemia   . Hypertension   . Hypothyroidism 07/17/2020  . Kidney disease   . Loss of hearing   . Macular degeneration   . Myocardial infarction ([name])   . Osteoarthritis   . Stroke [facility])     Medications:  See medication history  Assessment: 87 yo man to continue coumadin for h/o PE.  INR 2.6>>3.3 today, Hg 10.4, PTLC 184 Last dose coumadin PTA on 3/5 Per note, patient states hard to keep in therapeutic range. Patient also now on antibiotics which may increase INR.  PTA dose of coumadin 3mg  po q T,Th,Sat and 4mg  MWFSu.  Will d/c standing warfarin doses and hold warfarin today.   Goal of Therapy:  INR 2-3 Monitor platelets by anticoagulation protocol: Yes   Plan:  Hold warfarin today   Check INR daily Monitor for bleeding complications  Dwayne A. 89, PharmD, BCPS, FNKF Clinical Pharmacist [place] Please utilize Amion for appropriate phone number to  reach the unit pharmacist [facility])   09/01/2020,7:53 AM

## 2020-09-01 NOTE — Progress Notes (Addendum)
STROKE TEAM PROGRESS NOTE   INTERVAL HISTORY His wife and RN is at the bedside. He is very HOH and w/o hearing aids. He also has macular degeneration and has poor vision at baseline, however he tells me he had "trouble with vision" at the time his wife found him with confusion. Currently, confusion has improved. Right side weakness is residual from previous stroke his wife tells me. Wife gives further info regarding presentation: She takes his Bp at home and she noted it was very low: 80-90s- 40-50s.     Vitals:   08/31/20 2334 09/01/20 0401 09/01/20 0842 09/01/20 1238  BP: (!) 158/68 127/66 (!) 149/73 (!) 169/78  Pulse: 69 68 70 69  Resp: 18 19 18 18  Temp: 97.6 F (36.4 C) 98 F (36.7 C) 98.3 F (36.8 C) 97.7 F (36.5 C)  TempSrc: Oral Oral Oral Oral  SpO2: 98% 99% 96% 98%  Weight:      Height:       CBC:  Recent Labs  Lab 08/27/20 1526 08/27/20 1526 08/31/20 0059 08/31/20 0101 09/01/20 0337  WBC 10.8*  --  10.7*  --  10.1  NEUTROABS 5.8  --  6.4  --   --   HGB 11.1*   < > 11.0* 10.5* 10.4*  HCT 33.6*  --  33.4* 31.0* 30.7*  MCV 97.1  --  98.8  --  96.8  PLT 213  --  194  --  184   < > = values in this interval not displayed.   Basic Metabolic Panel:  Recent Labs  Lab 08/31/20 0059 08/31/20 0101 08/31/20 0523 09/01/20 0337  NA 135 138  --  136  K 4.4 4.4  --  4.8  CL 104 103  --  106  CO2 21*  --   --  22  GLUCOSE 214* 208*  --  180*  BUN 30* 31*  --  26*  CREATININE 1.37* 1.40*  --  1.21  CALCIUM 8.9  --   --  9.0  MG  --   --  1.7 2.0   Lipid Panel:  Recent Labs  Lab 08/31/20 0523  CHOL 90  TRIG 108  HDL 31*  CHOLHDL 2.9  VLDL 22  LDLCALC 37   HgbA1c:  Recent Labs  Lab 08/31/20 0523  HGBA1C 7.5*   Urine Drug Screen: No results for input(s): LABOPIA, COCAINSCRNUR, LABBENZ, AMPHETMU, THCU, LABBARB in the last 168 hours.  Alcohol Level No results for input(s): ETH in the last 168 hours.  IMAGING past 24 hours MR BRAIN WO CONTRAST  Result  Date: 09/01/2020 CLINICAL DATA:  Right PCA stroke EXAM: MRI HEAD WITHOUT CONTRAST TECHNIQUE: Multiplanar, multiecho pulse sequences of the brain and surrounding structures were obtained without intravenous contrast. COMPARISON:  08/19/2020 FINDINGS: Brain: Small to moderate size area restricted diffusion within the inferior right occipital lobe. No evidence of hemorrhage. There is no intracranial mass or significant mass effect. Prominence of the ventricles and sulci reflects stable parenchymal volume loss. Confluent areas of T2 hyperintensity in the supratentorial greater than pontine white matter are nonspecific but probably reflects stable advanced chronic microvascular ischemic changes. Several chronic small vessel infarcts are again identified including involvement of thalamus, basal ganglia, and cerebellum bilaterally. Small chronic lateral right temporal infarct. Vascular: Major vessel flow voids at the skull base are preserved. Skull and upper cervical spine: Normal marrow signal is preserved. Sinuses/Orbits: Minor mucosal thickening. Bilateral lens replacements. Other: Sella is unremarkable.  Mastoid air cells are   clear. IMPRESSION: Small to moderate size acute infarction of the inferior right occipital lobe. No hemorrhage. Stable chronic findings detailed above with no additional change since 08/19/2020. Electronically Signed   By: Macy Mis M.D.   On: 09/01/2020 11:49    PHYSICAL EXAM Constitutional: Appears well-developed and well-nourished. No acute distress Psych: Affect appropriate to situation Eyes: No scleral injection HENT: No OP obstruction. Head: Normocephalic.  Cardiovascular: Normal rate and regular rhythm.  Respiratory: Effort normal, symmetric excursions bilaterally, no audible wheezing. GI: Soft.  No distension. There is no tenderness.  Skin: WDI  Neuro: Mental Status: Patient is awake, alert, oriented to person, place, year. Patient is able to give a reasonable history.  Speech is fluent, basic intact comprehension and repetition. No signs of aphasia or neglect. CN: Visual Fields are difficult to fully assess d/t baseline poor vision (Macular degeneration). Pupils are ~96m  equal, reactive to light. EOMI without ptosis or diploplia. Facial sensation is symmetric to LT.Facial movement is symmetric, but at rest there is slight right flattening of nasolabial fold. Very poor hearing at baseline. Tongue is midline.Shoulder shrug is symmetric. Motor:Tone is normal. Bulk is normal. 4+/5 grip strength on right, with pronator drift noted. Gen weakness in bilt legs, but equal.  Sensation states sensation is "different" to light touch and temperature in the right arm and legs. Deep Tendon Reflexes: 2+ and symmetric in the biceps and patellae 1+. Toes mute FNF intact bilaterally. C/o pain with attempt to do HTS Gait - Deferred  ASSESSMENT/PLAN Mr. ABrockton Mckessonis a 87y.o. male with history of coagulopathy, CAD, DM, HTN, HLD, coagulopathy on coumadin, chronic strokes with residual right sided deficits, right facial droop. This admission he presents with confusion and "vision" problems in setting of low BP. CTH shows new early subacute left PCA distribution stroke.   Stroke - R occipital infarct, etiology likely secondary to diffuse severe intracranial stenosis in setting of low BP or orthostatic hypotension. However, hypercoagulable or occult afib with fluctuating INR is also possible  CTH: early subacute right PCA distribution stroke.  CTA head & neck severe intracranial atherosclerosis with widespread and high-grade stenoses. Right ICA bulb 60% stenosis  MRI  Confirms acute right occipital lobe stroke  2D Echo 08/08/20 EF 60-65%  LDL 37  HgbA1c 7.5  VTE prophylaxis - coumadin  ASA + Coumadin prior to admission, now on ASA 81 and coumadin.   Therapy recommendations:  SNF  Disposition:  pending  Intracranial severe stenosis  CTA head & neck severe  intracranial atherosclerosis with widespread and high-grade stenoses.   ?Orthostatic hypotension Hx of hypertension  Home meds:  Coreg, hygroton, Altace   Wife stated that pt BP at home was low 80/90s- 40/50s  Pt stated that his BP was low when he stands up  Orthostatic vitals unremarkable  Long-term BP goal 130-150 if possible given severe intracranial stenosis  Episodic right sided weakness  Per cardiology note 2/25 pt has episodic right sided weakness after sleeping on the right side. Suspect nerve compression. MRI negative for stroke at that time  Now I am wondering whether these episodes were due to orthostatic hypotension or low BP at home.  Anticoagulation   Patient has been on chronic anticoagulation with coumadin for over 20 years due to history of DVT and PE  Per cardiology note 2/25 - continue coumadin  Per oncology note 3/2 - consider change to eliquis - pt will discuss with cardiology  May consider pacemaker interrogation to rule out afib -  but will not change management  Pt stated that it is hard to him to keep INR at goal - INR 2.6 on admission  From a neurological standpoint, I would think that if he has had a stroke with therapeutic INR, Eliquis would be favored.  I would let the discussion happen between the outpatient cardiologist and the oncologist.  Discussed my plan with the hospitalist on secure chat.  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  Please call with questions.  -- Ashish Arora, MD Neurologist [facility] Pager: [phone]   I spent  35 minutes in total face-to-face time with the patient, more than 50% of which was spent in counseling and coordination of care, reviewing test results, images and medication, and discussing the diagnosis, treatment plan and potential prognosis. This patient's care requiresreview of multiple databases, neurological assessment, discussion with family, other specialists and medical decision making of high complexity.    To contact Stroke Continuity provider, please refer to [email or web address]. After hours, contact General Neurology 

## 2020-09-01 NOTE — Progress Notes (Signed)
Per order, changed device settings for MRI to DOO at 85 bpm   Tachy-therapies to off if applicable.   Will program device back to pre-MRI settings after completion of exam  

## 2020-09-01 NOTE — Progress Notes (Signed)
Appropriate Use Committee Chart Review  Chart reviewed by the physician advisor with input from [facility] and the attending MD as needed for review of the appropriateness for SNF referral.  TOC notes, PT/OT/ST notes, nursing notes and physician notes reviewed for medical necessity to determine if the patient's needs are appropriate for short-term rehab to return to a prior level of function versus the likely need for custodial care.  At this time, the patient meets  Medicare criteria for SNF placement.   Recommendations: The patient is SNF appropriate for short-term rehab/skilled nursing interventions.  Hillery Aldo, MD Chief Physician Advisor  09/01/2020 2:20 PM

## 2020-09-01 NOTE — Progress Notes (Signed)
Informed of MRI for today.   Device system confirmed to be MRI conditional, with implant date > 6 weeks ago and no evidence of abandoned or epicardial leads in review of most recent CXR Interrogation from today reviewed, pt is currently AP-VS at 71 bpm Change device settings for MRI to DOO at 85 bpm  Tachy-therapies to off if applicable.  Program device back to pre-MRI settings after completion of exam.  [name], [name]  09/01/2020 10:18 AM

## 2020-09-01 NOTE — Progress Notes (Signed)
Occupational Therapy Treatment Patient Details Name: Alcides Nutting MRN: [id number] DOB: 1929-01-03 Today's Date: 09/01/2020    History of present illness Pt is an 87 y/o male admitted 3/6 secondary to worsening R sided weakness. Workup pending, but CT showed acute to early subacute right PCA distribution. PMH includes CVA, CAD s/p CABG, HTN, macular degeneration, VTE, and DM.   OT comments  Patient supine in bed and agreeable to OT/PT session.  Patient completing bed mobility with mod assist +2, transfers at EOB with min assist +2 using RW.  Patient remains limited due to orthostatic hypotension with standing (and symptomatically dizzy), BP improves with sitting EOB-- see below for details. He is highly motivated and reports plan to dc to SNF.  Able to complete ADLs in sitting with supervision, but requires increased assist when standing.   BP supine: 175/71       Sitting: 153/64       Standing: 104/70       Return to sitting: 146/80       Return to supine: 156/72   Follow Up Recommendations  SNF;Supervision/Assistance - 24 hour    Equipment Recommendations  Other (comment) (TBD at next venue of care)    Recommendations for Other Services      Precautions / Restrictions Precautions Precautions: Fall;Other (comment) Precaution Comments: Watch BP - orthostatic Restrictions Weight Bearing Restrictions: No       Mobility Bed Mobility Overal bed mobility: Needs Assistance Bed Mobility: Supine to Sit;Sit to Supine     Supine to sit: Mod assist;+2 for physical assistance Sit to supine: Min assist;+2 for physical assistance   General bed mobility comments: Assist for trunk elevation to full sitting position and for LE elevation back up into bed at end of session.    Transfers Overall transfer level: Needs assistance Equipment used: Rolling walker (2 wheeled) Transfers: Sit to/from Stand Sit to Stand: Min assist;+2 physical assistance         General transfer comment:  Min guard to min A to stand using RW. Had pt stand for prolonged period for orthostatic testing. Orthostatics positive, so returned to sitting and then supine. Pt reporting dizziness throughout session, worsening with stand and improving with return to sitting.    Balance Overall balance assessment: Needs assistance Sitting-balance support: Feet supported;No upper extremity supported Sitting balance-Leahy Scale: Fair     Standing balance support: Bilateral upper extremity supported;During functional activity Standing balance-Leahy Scale: Poor Standing balance comment: reliant on BUE and minimal external support                           ADL either performed or assessed with clinical judgement   ADL Overall ADL's : Needs assistance/impaired     Grooming: Wash/dry hands;Wash/dry face;Supervision/safety;Set up;Sitting                   Toilet Transfer: Minimal assistance;+2 for physical assistance;+2 for safety/equipment Toilet Transfer Details (indicate cue type and reason): side stepping towards [facility] Toileting- Clothing Manipulation and Hygiene: Sit to/from stand;Moderate assistance       Functional mobility during ADLs: Minimal assistance;+2 for safety/equipment General ADL Comments: continues to be limited by orthostsatic hypotension     Vision       Perception     Praxis      Cognition Arousal/Alertness: Awake/alert Behavior During Therapy: WFL for tasks assessed/performed Overall Cognitive Status: Impaired/Different from baseline Area of Impairment: Orientation;Problem solving  Orientation Level: Disoriented to;Time           Problem Solving: Slow processing;Requires verbal cues          Exercises     Shoulder Instructions       General Comments      Pertinent Vitals/ Pain       Pain Assessment: Faces  Home Living                                          Prior Functioning/Environment               Frequency  Min 2X/week        Progress Toward Goals  OT Goals(current goals can now be found in the care plan section)  Progress towards OT goals: Progressing toward goals  Acute Rehab OT Goals Patient Stated Goal: to feel better OT Goal Formulation: With patient  Plan Frequency remains appropriate;Discharge plan remains appropriate    Co-evaluation    PT/OT/SLP Co-Evaluation/Treatment: Yes Reason for Co-Treatment: Necessary to address cognition/behavior during functional activity;For patient/therapist safety;To address functional/ADL transfers PT goals addressed during session: Mobility/safety with mobility;Balance;Proper use of DME OT goals addressed during session: ADL's and self-care      AM-PAC OT "6 Clicks" Daily Activity     Outcome Measure   Help from another person eating meals?: A Little Help from another person taking care of personal grooming?: A Little Help from another person toileting, which includes using toliet, bedpan, or urinal?: A Lot Help from another person bathing (including washing, rinsing, drying)?: A Little Help from another person to put on and taking off regular upper body clothing?: A Little Help from another person to put on and taking off regular lower body clothing?: A Lot 6 Click Score: 16    End of Session Equipment Utilized During Treatment: Rolling walker;Gait belt  OT Visit Diagnosis: Unsteadiness on feet (R26.81)   Activity Tolerance Treatment limited secondary to medical complications (Comment) (orthostatic hypotension)   Patient Left in bed;with call bell/phone within reach;with bed alarm set   Nurse Communication Mobility status        Time: 4174-0814 OT Time Calculation (min): 23 min  Charges: OT General Charges $OT Visit: 1 Visit OT Treatments $Self Care/Home Management : 8-22 mins  Barry Brunner, OT Acute Rehabilitation Services Pager [phone] Office [phone]    Chancy Milroy 09/01/2020, 1:28 PM

## 2020-09-01 NOTE — NC FL2 (Signed)
[place] MEDICAID FL2 LEVEL OF CARE SCREENING TOOL     IDENTIFICATION  [name] Birthdate: 11/16/1928 Sex: male Admission Date (Current Location): 08/31/2020  [facility] and [place] Number:  Producer, television/film/video and Address:  [facility] [place], [address] [place], [place] [address]      Provider Number: [id number]  Attending Physician Name and Address:  Cipriano Bunker, MD  Relative Name and Phone Number:       Current Level of Care: Hospital Recommended Level of Care: Skilled Nursing Facility Prior Approval Number:    Date Approved/Denied:   PASRR Number: [id number]  Discharge Plan: SNF    Current Diagnoses: Patient Active Problem List   Diagnosis Date Noted  . TIA (transient ischemic attack) 08/31/2020  . Normocytic anemia 08/31/2020  . Prolonged QT interval 08/31/2020  . Acute CVA (cerebrovascular accident) ([name]) 08/31/2020  . Debility 08/21/2020  . Lytic bone lesion of femur 08/21/2020  . Sick sinus syndrome ([name]) 07/31/2020  . Coronary artery disease s/p CABG 2001 07/17/2020  . Hypothyroidism 07/17/2020  . Venous thromboembolism (VTE) 07/17/2020  . Hypertension 07/17/2020  . Cardiac pacemaker 07/17/2020  . Osteoarthritis 07/17/2020  . Type 2 diabetes mellitus with hyperglycemia ([name]) 07/17/2020  . Vitamin D deficiency 07/17/2020  . Macular degeneration 07/17/2020  . Dyslipidemia due to type 2 diabetes mellitus ([name]) 07/17/2020  . Insomnia 07/17/2020  . BPH (benign prostatic hyperplasia) 07/17/2020  . Colon polyps 07/17/2020    Orientation RESPIRATION BLADDER Height & Weight     Self,Time,Situation,Place  Normal Incontinent Weight: 77.7 kg Height:  5\' 8"  (172.7 cm)  BEHAVIORAL SYMPTOMS/MOOD NEUROLOGICAL BOWEL NUTRITION STATUS      Continent Diet (regular diet with thin liquids)  AMBULATORY STATUS COMMUNICATION OF NEEDS Skin   Extensive Assist Verbally Normal                       Personal Care  Assistance Level of Assistance  Bathing,Feeding,Dressing Bathing Assistance: Limited assistance Feeding assistance: Independent Dressing Assistance: Maximum assistance     Functional Limitations Info  Sight,Hearing Sight Info: Impaired Hearing Info: Impaired      SPECIAL CARE FACTORS FREQUENCY  PT (By licensed PT),OT (By licensed OT),Speech therapy     PT Frequency: 5x/wk OT Frequency: 5x/wk     Speech Therapy Frequency: 5x/wk      Contractures Contractures Info: Not present    Additional Factors Info  Code Status,Allergies,Psychotropic,Insulin Sliding Scale Code Status Info: LCB Allergies Info: amitriptylline/ effexor Xr/ lyrica/ morphine/ morphine and related/ pravachol/ zolpidem Psychotropic Info: Remeron 7.5 mg at bedtime Insulin Sliding Scale Info: Lantus 13 units at bedtime       Current Medications (09/01/2020):  This is the current hospital active medication list Current Facility-Administered Medications  Medication Dose Route Frequency Provider Last Rate Last Admin  . acetaminophen (TYLENOL) tablet 650 mg  650 mg Oral Q6H PRN 11/01/2020, MD   650 mg at 09/01/20 11/01/20   Or  . acetaminophen (TYLENOL) suppository 650 mg  650 mg Rectal Q6H PRN 1749, MD      . aspirin chewable tablet 81 mg  81 mg Oral Daily Bobette Mo, MD   81 mg at 09/01/20 11/01/20  . cephALEXin (KEFLEX) capsule 500 mg  500 mg Oral Q12H 4496, MD   500 mg at 09/01/20 11/01/20  . cholecalciferol (VITAMIN D3) tablet 1,000 Units  1,000 Units Oral Daily 7591, MD  1,000 Units at 09/01/20 0923  . feeding supplement (ENSURE ENLIVE / ENSURE PLUS) liquid 237 mL  237 mL Oral BID BM Cipriano Bunker, MD   237 mL at 09/01/20 1342  . insulin glargine (LANTUS) injection 13 Units  13 Units Subcutaneous QHS Bobette Mo, MD   13 Units at 08/31/20 2227  . levothyroxine (SYNTHROID) tablet 100 mcg  100 mcg Oral Daily Bobette Mo, MD   100 mcg at 09/01/20  [phone]  . mirtazapine (REMERON) tablet 7.5 mg  7.5 mg Oral QHS Bobette Mo, MD   7.5 mg at 08/31/20 2226  . [START ON 09/02/2020] multivitamin with minerals tablet 1 tablet  1 tablet Oral Daily Cipriano Bunker, MD      . prochlorperazine (COMPAZINE) injection 5 mg  5 mg Intravenous Q4H PRN Bobette Mo, MD      . rosuvastatin (CRESTOR) tablet 20 mg  20 mg Oral QHS Bobette Mo, MD   20 mg at 08/31/20 2226  . tamsulosin (FLOMAX) capsule 0.4 mg  0.4 mg Oral QPC supper Bobette Mo, MD   0.4 mg at 08/31/20 1856  . Warfarin - Pharmacist Dosing Inpatient   Does not apply q1600 Albertine Grates, MD   Given at 08/31/20 1716     Discharge Medications: Please see discharge summary for a list of discharge medications.  Relevant Imaging Results:  Relevant Lab Results:   Additional Information SS#: [id number]--EY is vaccinated with both Moderna Feb/ March 2021  Kermit Balo, RN

## 2020-09-01 NOTE — Progress Notes (Signed)
Initial Nutrition Assessment  DOCUMENTATION CODES:   Not applicable  INTERVENTION:   - Ensure Enlive po BID, each supplement provides 350 kcal and 20 grams of protein  - MVI with minerals daily  - Liberalize diet to Regular, verbal with readback placed per MD  NUTRITION DIAGNOSIS:   Increased nutrient needs related to acute illness as evidenced by estimated needs.  GOAL:   Patient will meet greater than or equal to 90% of their needs  MONITOR:   PO intake,Supplement acceptance,Labs,Weight trends  REASON FOR ASSESSMENT:   Malnutrition Screening Tool    ASSESSMENT:   87 year old male who presented on 3/06 as a Code Stroke. PMH of osteoarthritis, DVT, CAD s/p CABG x 6, T2DM, HLD, HTN, hypothyroidism, hearing impairment. Pt admitted with acute CVA.   Per notes, pt was recently found to have bone lytic lesions which is still under work-up by Oncology.  Pt unavailable at time of RD visit (off unit for MRI). Unable to obtain diet and weight history from pt at this time.  Reviewed available weights in chart. Weight history is limited and dates back only to 07/17/20. Weight stable since 07/17/20 with fluctuations between 76.4-77.7 kg.  No meal completions documented since admission.  RD will order oral nutrition supplements to maximize kcal and protein intake. Recommend liberalizing diet to Regular, discussed with MD who agreed. RD will also order daily MVI with minerals.  Medications reviewed and include: cholecalciferol, lantus 13 units daily, remeron, warfarin IVF: NS @ 100 ml/hr  Labs reviewed: HDL 31, hemoglobin A1C 7.5 CBG's: 156-199  UOP: 1750 ml x 24 hours  NUTRITION - FOCUSED PHYSICAL EXAM:  Unable to complete at this time. Pt unavailable.  Diet Order:   Diet Order            Diet heart healthy/carb modified Room service appropriate? Yes; Fluid consistency: Thin  Diet effective now                 EDUCATION NEEDS:   Not appropriate for education at  this time  Skin:  Skin Assessment: Reviewed RN Assessment  Last BM:  no documented BM  Height:   Ht Readings from Last 1 Encounters:  08/31/20 5\' 8"  (1.727 m)    Weight:   Wt Readings from Last 1 Encounters:  08/31/20 77.7 kg    BMI:  Body mass index is 26.05 kg/m.  Estimated Nutritional Needs:   Kcal:  1750-1950  Protein:  85-100 grams  Fluid:  1.7-1.9 L    10/31/20, MS, RD, LDN Inpatient Clinical Dietitian Please see AMiON for contact information.

## 2020-09-01 NOTE — Progress Notes (Signed)
SLP Cancellation Note  Patient Details Name: Gilliam Hawkes MRN: [id number] DOB: 1928-10-09   Cancelled treatment:       Reason Eval/Treat Not Completed: Patient at procedure. Pt currently off unit for MRI. Will continue efforts to complete cognitive linguistic evaluation when pt is available.   Celia B. Murvin Natal, [facility], CCC-SLP Speech Language Pathologist Office: [phone]  Leigh Aurora 09/01/2020, 11:42 AM

## 2020-09-01 NOTE — Progress Notes (Signed)
STROKE TEAM PROGRESS NOTE   INTERVAL HISTORY His wife and RN is at the bedside. He is very HOH and w/o hearing aids. He also has macular degeneration and has poor vision at baseline, however he tells me he had "trouble with vision" at the time his wife found him with confusion. Currently, confusion has improved. Right side weakness is residual from previous stroke his wife tells me. Wife gives further info regarding presentation: She takes his Bp at home and she noted it was very low: 80-90s- 40-50s.           Vitals:   08/31/20 0900 08/31/20 1100 08/31/20 1400 08/31/20 1500  BP: 97/80 113/75 138/65 (!) 156/70  Pulse: 70 70 69 71  Resp: '17 16 20 ' (!) 23  Temp:      TempSrc:      SpO2: 98% 99% 97% 96%  Weight:      Height:       CBC:  Last Labs        Recent Labs  Lab 08/27/20 1526 08/31/20 0059 08/31/20 0101  WBC 10.8* 10.7*  --   NEUTROABS 5.8 6.4  --   HGB 11.1* 11.0* 10.5*  HCT 33.6* 33.4* 31.0*  MCV 97.1 98.8  --   PLT 213 194  --      Basic Metabolic Panel:  Last Labs         Recent Labs  Lab 08/27/20 1526 08/31/20 0059 08/31/20 0101 08/31/20 0523  NA 136 135 138  --   K 4.7 4.4 4.4  --   CL 108 104 103  --   CO2 20* 21*  --   --   GLUCOSE 149* 214* 208*  --   BUN 23 30* 31*  --   CREATININE 1.16 1.37* 1.40*  --   CALCIUM 8.8* 8.9  --   --   MG  --   --   --  1.7     Lipid Panel:  Last Labs      Recent Labs  Lab 08/31/20 0523  CHOL 90  TRIG 108  HDL 31*  CHOLHDL 2.9  VLDL 22  LDLCALC 37     HgbA1c:  Last Labs      Recent Labs  Lab 08/31/20 0523  HGBA1C 7.5*     Urine Drug Screen:  Last Labs   No results for input(s): LABOPIA, COCAINSCRNUR, LABBENZ, AMPHETMU, THCU, LABBARB in the last 168 hours.    Alcohol Level  Last Labs   No results for input(s): ETH in the last 168 hours.    IMAGING past 24 hours CT ANGIO HEAD W OR WO CONTRAST  Result Date: 08/31/2020 CLINICAL DATA:  Right-sided deficits and  right facial droop. EXAM: CT ANGIOGRAPHY HEAD AND NECK TECHNIQUE: Multidetector CT imaging of the head and neck was performed using the standard protocol during bolus administration of intravenous contrast. Multiplanar CT image reconstructions and MIPs were obtained to evaluate the vascular anatomy. Carotid stenosis measurements (when applicable) are obtained utilizing NASCET criteria, using the distal internal carotid diameter as the denominator. CONTRAST:  7m OMNIPAQUE IOHEXOL 350 MG/ML SOLN COMPARISON:  Head CT from earlier today FINDINGS: CTA NECK FINDINGS Aortic arch: 2 vessel arch with atheromatous calcification. Right carotid system: Diffuse atheromatous wall thickening with superimposed calcified plaques. Irregular plaque at the left ICA bifurcation which causes 60% stenosis at the ICA origin. No ulceration or dissection seen. Left carotid system: Diffuse atheromatous plaque which is mixed density. No flow limiting stenosis or ulceration.  Vertebral arteries: No proximal subclavian stenosis. Diminutive right vertebral artery, likely both congenital and atheromatous, but patent to the dura. Mild atheromatous narrowing at the left vertebral origin. Skeleton: Advanced degenerative facet spurring with multilevel endplate spurring. No acute osseous finding. Other neck: No acute finding. Upper chest: Negative Review of the MIP images confirms the above findings CTA HEAD FINDINGS Anterior circulation: Diffuse calcified plaque along the carotid siphons. Approximately 50% stenosis of the right ICA at the paraclinoid segment. Diffuse atheromatous irregularity and moderate to advanced narrowing beginning at the M1 segments and affecting the right more than left MCA branches. The ACA distribution is equally affected and there is bilateral severe Peri callosal narrowings Posterior circulation: Calcified plaque on both V4 segments. The non dominant right V4 segment ends in the right PICA. Diffuse atheromatous  irregularity of the left vertebral and basilar arteries with a moderate mid basilar stenosis. Fetal type right PCA. Extensive atheromatous irregularity to bilateral posterior cerebral arteries especially at the P3 segments and beyond. Venous sinuses: Diffusely patent high density left-sided venous system attributed to reflux from the chest. The left brachiocephalic vein is stenotic in the setting of pacer. Anatomic variants: Fetal type right PCA Review of the MIP images confirms the above findings IMPRESSION: 1. No emergent finding. 2. Severe intracranial atherosclerosis with widespread and high-grade stenoses. 3. Atherosclerosis in the neck with up to 60% narrowing at the right ICA origin. 4. Stenotic left brachiocephalic vein in the setting of pacer lead. Electronically Signed   By: Monte Fantasia M.D.   On: 08/31/2020 08:54   CT ANGIO NECK W OR WO CONTRAST  Result Date: 08/31/2020 CLINICAL DATA:  Right-sided deficits and right facial droop. EXAM: CT ANGIOGRAPHY HEAD AND NECK TECHNIQUE: Multidetector CT imaging of the head and neck was performed using the standard protocol during bolus administration of intravenous contrast. Multiplanar CT image reconstructions and MIPs were obtained to evaluate the vascular anatomy. Carotid stenosis measurements (when applicable) are obtained utilizing NASCET criteria, using the distal internal carotid diameter as the denominator. CONTRAST:  60m OMNIPAQUE IOHEXOL 350 MG/ML SOLN COMPARISON:  Head CT from earlier today FINDINGS: CTA NECK FINDINGS Aortic arch: 2 vessel arch with atheromatous calcification. Right carotid system: Diffuse atheromatous wall thickening with superimposed calcified plaques. Irregular plaque at the left ICA bifurcation which causes 60% stenosis at the ICA origin. No ulceration or dissection seen. Left carotid system: Diffuse atheromatous plaque which is mixed density. No flow limiting stenosis or ulceration. Vertebral arteries: No proximal subclavian  stenosis. Diminutive right vertebral artery, likely both congenital and atheromatous, but patent to the dura. Mild atheromatous narrowing at the left vertebral origin. Skeleton: Advanced degenerative facet spurring with multilevel endplate spurring. No acute osseous finding. Other neck: No acute finding. Upper chest: Negative Review of the MIP images confirms the above findings CTA HEAD FINDINGS Anterior circulation: Diffuse calcified plaque along the carotid siphons. Approximately 50% stenosis of the right ICA at the paraclinoid segment. Diffuse atheromatous irregularity and moderate to advanced narrowing beginning at the M1 segments and affecting the right more than left MCA branches. The ACA distribution is equally affected and there is bilateral severe Peri callosal narrowings Posterior circulation: Calcified plaque on both V4 segments. The non dominant right V4 segment ends in the right PICA. Diffuse atheromatous irregularity of the left vertebral and basilar arteries with a moderate mid basilar stenosis. Fetal type right PCA. Extensive atheromatous irregularity to bilateral posterior cerebral arteries especially at the P3 segments and beyond. Venous sinuses: Diffusely patent high density  left-sided venous system attributed to reflux from the chest. The left brachiocephalic vein is stenotic in the setting of pacer. Anatomic variants: Fetal type right PCA Review of the MIP images confirms the above findings IMPRESSION: 1. No emergent finding. 2. Severe intracranial atherosclerosis with widespread and high-grade stenoses. 3. Atherosclerosis in the neck with up to 60% narrowing at the right ICA origin. 4. Stenotic left brachiocephalic vein in the setting of pacer lead. Electronically Signed   By: Monte Fantasia M.D.   On: 08/31/2020 08:54   CT HEAD CODE STROKE WO CONTRAST  Result Date: 08/31/2020 CLINICAL DATA:  Code stroke. Initial evaluation for acute right-sided deficits, right facial droop. EXAM: CT HEAD  WITHOUT CONTRAST TECHNIQUE: Contiguous axial images were obtained from the base of the skull through the vertex without intravenous contrast. COMPARISON:  Previous MRI from 08/19/2020 FINDINGS: Brain: Generalized age-related cerebral atrophy with advanced chronic microvascular ischemic disease. Associated remote lacunar infarcts noted within the bilateral thalami. Additional small remote right temporal lobe infarct. There is new wedge-shaped parenchymal hypodensity involving the right occipital pole, consistent with an evolving acute to early subacute right PCA territory infarct. No associated hemorrhage or mass effect. No other visible acute large vessel territory infarct. No intracranial hemorrhage elsewhere within the brain. No mass lesion, midline shift or mass effect. No hydrocephalus or extra-axial fluid collection. Vascular: No hyperdense vessel. Calcified atherosclerosis present at skull base. Skull: Scalp soft tissues and calvarium within normal limits. Sinuses/Orbits: Globes and orbital soft tissues demonstrate no acute finding. Scattered mucosal thickening noted within the ethmoidal air cells. Small right maxillary sinus retention cyst. No mastoid effusion. Other: None. ASPECTS [facility] Stroke Program Early CT Score) - Ganglionic level infarction (caudate, lentiform nuclei, internal capsule, insula, M1-M3 cortex): 7 - Supraganglionic infarction (M4-M6 cortex): 3 Total score (0-10 with 10 being normal): 10 IMPRESSION: 1. Evolving acute to early subacute right PCA distribution infarct, new as compared to recent MRI from 08/19/2020. No associated hemorrhage or mass effect. 2. No other acute intracranial abnormality. 3. ASPECTS is 10. 4. Underlying age-related cerebral atrophy with advanced chronic microvascular ischemic disease, with additional chronic right temporal lobe infarct. These results were communicated to Dr. Theda Sers at 1:18 amon 3/6/2022by text page via the [facility] messaging system. Electronically  Signed   By: Jeannine Boga M.D.   On: 08/31/2020 01:21    PHYSICAL EXAM Constitutional: Appears well-developed and well-nourished. No acute distress Psych: Affect appropriate to situation Eyes: No scleral injection HENT: No OP obstruction. Head: Normocephalic.  Cardiovascular: Normal rate and regular rhythm.  Respiratory: Effort normal, symmetric excursions bilaterally, no audible wheezing. GI: Soft. No distension. There is no tenderness.  Skin: WDI  Neuro: Mental Status: Patient is awake, alert, oriented to person, place, year. Patient is able to give areasonablehistory. Speechisfluent, basic intact comprehension and repetition. No signs of aphasia or neglect. CN: Visual Fields are difficult to fully assess d/t baseline poor vision (Macular degeneration). Pupils are~69mequal, reactive to light. EOMI without ptosis or diploplia. Facial sensation is symmetric to LT.Facial movement is symmetric, but at rest there is slight right flattening of nasolabial fold. Very poor hearing at baseline. Tongue is midline.Shoulder shrug is symmetric. Motor:Tone is normal. Bulk is normal.4+/5 grip strength on right, with pronator drift noted. Gen weakness in bilt legs, but equal.  Sensation states sensation is "different" to light touch and temperature in the right arm and legs. Deep Tendon Reflexes: 2+ and symmetric in the biceps and patellae 1+. Toesmute FNF intact bilaterally. C/o pain with attempt  to do HTS Gait - Deferred  ASSESSMENT/PLAN Thomas [name] is a 87 y.o. male with history of coagulopathy, CAD, DM, HTN, HLD,coagulopathy on coumadin, chronic strokeswith residual right sided deficits,rightfacial droop. This admission he presents with confusion and "vision" problems in setting of low BP. CTH shows new early subacute left PCA distribution stroke.   Stroke - R occipital infarct, etiology likely secondary to diffuse severe intracranial stenosis in setting of low BP  or orthostatic hypotension. However, hypercoagulable or occult afib with fluctuating INR is also possible  CTH: early subacute right PCA distribution stroke.  CTA head & neck severe intracranial atherosclerosis with widespread and high-grade stenoses. Right ICA bulb 60% stenosis  MRI  pending  2D Echo 08/08/20 EF 60-65%  LDL 37  HgbA1c 7.5  VTE prophylaxis - coumadin  ASA + Coumadin prior to admission, now on ASA 81 and coumadin.   Therapy recommendations:  pending  Disposition:  pending  Intracranial severe stenosis  CTA head & neck severe intracranial atherosclerosis with widespread and high-grade stenoses.   ? Orthostatic hypotension Hx of hypertension  Home meds:  Coreg, hygroton, Altace   Wife stated that pt BP at home was low 80/90s- 40/50s  Pt stated that his BP was low when he stands up  Will check orthostatic vitals.  Long-term BP goal 130-150 if possible given severe intracranial stenosis  Episodic right sided weakness  Per cardiology note 2/25 pt has episodic right sided weakness after sleeping on the right side. Suspect nerve compression. MRI negative for stroke at that time  Now I am wondering whether these episodes were due to orthostatic hypotension or low BP at home.  Anticoagulation   Patient has been on chronic anticoagulation with coumadin for over 20 years due to history of DVT and PE  Per cardiology note 2/25 - continue coumadin  Per oncology note 3/2 - consider change to eliquis - pt will discuss with cardiology  May consider pacemaker interrogation to rule out afib - but will not change management  Pt stated that it is hard to him to keep INR at goal - INR 2.6 on admission  Hyperlipidemia  Home meds:  Crestor 62m  LDL 37, goal < 70  Resume home crestor  Continue statin at discharge  Diabetes type II Controlled  Home meds:  Lantus, Jantoven  HgbA1c 7.5, goal < 7.0  CBGs  SSI  Close PCP follow up   Other Stroke  Risk Factors  Advanced Age >/= 674  Hx stroke in 2010 left with mild right side weakness and right ptosis  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 Pt was seen and examined.   87year old male with history of CAD/MI status post CABG, diabetes, hypertension, hyperlipidemia, DVT and PE on Coumadin, SSS status post CABG, stroke in 2010 with right side mild residual admitted for right-sided weakness, right facial droop and slurred speech.  Currently patient stated that he still has right facial droop and right hand weakness which was more than his baseline.  CT showed right PCA territory subacute infarct new from last MRI in 07/2020.  CT head and neck showed diffuse severe intracranial stenosis.  MRI pending.  EF 60 to 65%.  Creatinine 1.40.  LDL 37, A1c 7.5.  On exam, patient awake alert, fully orientated.  No aphasia, no significant dysarthria.  Follows simple commands.  Able to name and repeat.  Right eye ptosis, no gaze palsy, visual field full.  Right facial droop.  Bilateral upper extremity 5/5 proximal, bicep and tricep.  However right hand finger grip 4/5 and decreased dexterity.  Bilateral lower extremity symmetrical 4+/5.  Sensation symmetrical.  Finger-to-nose intact bilaterally.  Etiology for patient stroke most likely due to hypotension or orthostatic hypotension in the setting of severe intracranial stenosis.  On his last appointment with cardiologist, he did mention episodic right-sided weakness, suspected nerve compression during sleep, now I am wondering whether those episode could be TIA with hypotension.  Patient BP soft during this admission, will check orthostatic vitals.  Regarding patient  anticoagulation use, patient oncologist will like to change patient Coumadin to Eliquis.  Patient yet to discuss with his cardiologist.  Patient did stated that his INR difficult control, may consider change his Coumadin to Eliquis this admission.   Okay to continue Coumadin and aspirin and Crestor at this time.  Avoid low BP.  Stroke team Will follow.  For detailed assessment and plan, please refer to above as I have made changes wherever appropriate.   Rosalin Hawking, MD PhD Stroke Neurology 08/31/2020 5:38 PM  I spent 4mnutes in total face-to-face time with the patient, more than 50% of which was spent in counseling and coordination of care, reviewing test results, images and medication, and discussing the diagnosis, treatment plan and potential prognosis. This patient's care requiresreview of multiple databases, neurological assessment, discussion with family, other specialists and medical decision making of high complexity.    To contact Stroke Continuity provider, please refer to [email or web address] After hours, contact General Neurology

## 2020-09-01 NOTE — Progress Notes (Signed)
Physical Therapy Treatment Patient Details Name: Thomas [name] MRN: [id number] DOB: 07-07-1928 Today's Date: 09/01/2020    History of Present Illness Pt is an 87 y/o male admitted 3/6 secondary to worsening R sided weakness. Workup pending, but CT showed acute to early subacute right PCA distribution. PMH includes CVA, CAD s/p CABG, HTN, macular degeneration, VTE, and DM.    PT Comments    Pt progressing towards physical therapy goals. Continues to be limited by orthostatic hypotension. Overall, pt experienced a 71 point drop systolically between supine and standing. Anticipate that once blood pressure is stabilized, pt will progress well with physical therapy. If he continues to decline functionally, he may require post-acute rehab prior to return home. Will continue to follow.   Orthostatic VS for the past 24 hrs:  BP- Lying Pulse- Lying BP- Sitting Pulse- Sitting BP- Standing at 0 minutes Pulse- Standing at 0 minutes  09/01/20 0859 175/71 68 153/64 75 104/70 80      Follow Up Recommendations  Home health PT;Supervision/Assistance - 24 hour     Equipment Recommendations  None recommended by PT    Recommendations for Other Services       Precautions / Restrictions Precautions Precautions: Fall;Other (comment) Precaution Comments: Watch BP - orthostatic Restrictions Weight Bearing Restrictions: No    Mobility  Bed Mobility Overal bed mobility: Needs Assistance Bed Mobility: Supine to Sit;Sit to Supine     Supine to sit: Mod assist;+2 for physical assistance Sit to supine: Min assist;+2 for physical assistance   General bed mobility comments: Assist for trunk elevation to full sitting position and for LE elevation back up into bed at end of session.    Transfers Overall transfer level: Needs assistance Equipment used: Rolling walker (2 wheeled) Transfers: Sit to/from Stand Sit to Stand: Min assist;+2 physical assistance         General transfer comment: Min  guard to min A to stand using RW. Had pt stand for prolonged period for orthostatic testing. Orthostatics positive, so returned to sitting and then supine. Pt reporting dizziness throughout session, worsening with stand and improving with return to sitting.  Ambulation/Gait Ambulation/Gait assistance: Min assist;+2 physical assistance Gait Distance (Feet): 3 Feet Assistive device: Rolling walker (2 wheeled) Gait Pattern/deviations: Step-to pattern Gait velocity: Decreased Gait velocity interpretation: <1.31 ft/sec, indicative of household ambulator General Gait Details: Side steps at EOB. Pt losing balance and gaining too much momentum to the R, requiring assist to recover.   Stairs             Wheelchair Mobility    Modified Rankin (Stroke Patients Only) Modified Rankin (Stroke Patients Only) Pre-Morbid Rankin Score: No symptoms Modified Rankin: Moderately severe disability     Balance Overall balance assessment: Needs assistance Sitting-balance support: Feet supported;No upper extremity supported Sitting balance-Leahy Scale: Fair     Standing balance support: Bilateral upper extremity supported;During functional activity Standing balance-Leahy Scale: Poor Standing balance comment: reliant on BUE and minimal external support                            Cognition Arousal/Alertness: Awake/alert Behavior During Therapy: WFL for tasks assessed/performed Overall Cognitive Status: Impaired/Different from baseline Area of Impairment: Orientation;Problem solving                 Orientation Level: Disoriented to;Time           Problem Solving: Slow processing;Requires verbal cues  Exercises      General Comments        Pertinent Vitals/Pain Pain Assessment: Faces    Home Living                      Prior Function            PT Goals (current goals can now be found in the care plan section) Acute Rehab PT Goals Patient  Stated Goal: to feel better PT Goal Formulation: With patient Time For Goal Achievement: 09/14/20 Potential to Achieve Goals: Good Progress towards PT goals: Progressing toward goals    Frequency    Min 3X/week      PT Plan Current plan remains appropriate    Co-evaluation PT/OT/SLP Co-Evaluation/Treatment: Yes Reason for Co-Treatment: Necessary to address cognition/behavior during functional activity;For patient/therapist safety;To address functional/ADL transfers PT goals addressed during session: Mobility/safety with mobility;Balance;Proper use of DME OT goals addressed during session: ADL's and self-care      AM-PAC PT "6 Clicks" Mobility   Outcome Measure  Help needed turning from your back to your side while in a flat bed without using bedrails?: A Little Help needed moving from lying on your back to sitting on the side of a flat bed without using bedrails?: A Little Help needed moving to and from a bed to a chair (including a wheelchair)?: A Little Help needed standing up from a chair using your arms (e.g., wheelchair or bedside chair)?: A Little Help needed to walk in hospital room?: A Lot Help needed climbing 3-5 steps with a railing? : Total 6 Click Score: 15    End of Session Equipment Utilized During Treatment: Gait belt Activity Tolerance: Treatment limited secondary to medical complications (Comment) (+ orthostatics) Patient left: in bed;with call bell/phone within reach;with bed alarm set Nurse Communication: Mobility status PT Visit Diagnosis: Unsteadiness on feet (R26.81);Muscle weakness (generalized) (M62.81)     Time: 7893-8101 PT Time Calculation (min) (ACUTE ONLY): 23 min  Charges:  $Therapeutic Activity: 8-22 mins                     Conni Slipper, PT, DPT Acute Rehabilitation Services Pager: [phone] Office: [phone]    Marylynn Pearson 09/01/2020, 1:31 PM

## 2020-09-01 NOTE — TOC Initial Note (Signed)
Transition of Care [facility]) - Initial/Assessment Note    Patient Details  Name: Thomas [name] MRN: [id number] Date of Birth: 21-Jan-1929  Transition of Care [facility]) CM/SW Contact:    Pollie Friar, RN Phone Number: 09/01/2020, 4:01 PM  Clinical Narrative:                 Recommendations are for [facility] vs SNF depending on family ability to assist at home. CM met with the patient, son, wife at the bedside. Wife is not able to assist patient at home as needed. Wife and son feel SNF rehab prior to returning home is the best option. CM provided them with a list of the SNF in [facility]. Son and wife agreeable to pt being faxed out for bed offers.  TOC following   Expected Discharge Plan: [place] Barriers to Discharge: Continued Medical Work up   Patient Goals and CMS Choice   CMS Medicare.gov Compare Post Acute Care list provided to:: Patient Choice offered to / list presented to : [name]  Expected Discharge Plan and Services Expected Discharge Plan: Skilled Nursing Facility In-house Referral: Clinical Social Work Discharge Planning Services: CM Consult Post Acute Care Choice: [place] Living arrangements for the past 2 months: [place]                                      Prior Living Arrangements/Services Living arrangements for the past 2 months: Single Family Home Lives with:: Spouse Patient language and need for interpreter reviewed:: Yes Do you feel safe going back to the place where you live?: Yes      Need for Family Participation in Patient Care: Yes (Comment) Care giver support system in place?: Yes (comment)   Criminal Activity/Legal Involvement Pertinent to Current Situation/Hospitalization: No - Comment as needed  Activities of Daily Living Home Assistive Devices/Equipment: None ADL Screening (condition at time of admission) Patient's cognitive ability adequate to safely complete daily  activities?: Yes Is the patient deaf or have difficulty hearing?: Yes Does the patient have difficulty seeing, even when wearing glasses/contacts?: Yes Does the patient have difficulty concentrating, remembering, or making decisions?: No Patient able to express need for assistance with ADLs?: Yes Does the patient have difficulty dressing or bathing?: Yes Independently performs ADLs?: No Does the patient have difficulty walking or climbing stairs?: Yes Weakness of Legs: Right Weakness of Arms/Hands: Right  Permission Sought/Granted                  Emotional Assessment Appearance:: Appears stated age Attitude/Demeanor/Rapport: Other (comment) Affect (typically observed): Calm Orientation: : Oriented to Self,Oriented to Place,Oriented to  Time,Oriented to Situation   Psych Involvement: No (comment)  Admission diagnosis:  TIA (transient ischemic attack) [G45.9] Acute CVA (cerebrovascular accident) [facility]) [I63.9] Patient Active Problem List   Diagnosis Date Noted  . TIA (transient ischemic attack) 08/31/2020  . Normocytic anemia 08/31/2020  . Prolonged QT interval 08/31/2020  . Acute CVA (cerebrovascular accident) ([place]) 08/31/2020  . Debility 08/21/2020  . Lytic bone lesion of femur 08/21/2020  . Sick sinus syndrome ([place]) 07/31/2020  . Coronary artery disease s/p CABG 2001 07/17/2020  . Hypothyroidism 07/17/2020  . Venous thromboembolism (VTE) 07/17/2020  . Hypertension 07/17/2020  . Cardiac pacemaker 07/17/2020  . Osteoarthritis 07/17/2020  . Type 2 diabetes mellitus with hyperglycemia ([place]) 07/17/2020  . Vitamin D deficiency 07/17/2020  . Macular degeneration  07/17/2020  . Dyslipidemia due to type 2 diabetes mellitus ([place]) 07/17/2020  . Insomnia 07/17/2020  . BPH (benign prostatic hyperplasia) 07/17/2020  . Colon polyps 07/17/2020   PCP:  Vivi Barrack, MD Pharmacy:   [facility] [place], [place] - [address] [place] [address] [place]  [address] [place] [address] Phone: [phone] Fax: [phone]     Social Determinants of Health (SDOH) Interventions    Readmission Risk Interventions No flowsheet data found.

## 2020-09-01 NOTE — Progress Notes (Addendum)
PROGRESS NOTE    Ryun Velez  ZOX:[id number] DOB: 01/22/1929 DOA: 08/31/2020 PCP: Ardith Dark, MD   Brief Narrative:  This 87 years old male with PMH significant for osteoarthritis at multiple sites, history of DVT, CAD/CABG x 6, history of angioplasty with stent placement, type 2 diabetes, hyperlipidemia, hypertension, hypothyroidism, chronic kidney disease, hearing impairment, macular degeneration, history of nonhemorrhagic stroke brought in the ED with right-sided weakness along with right sided facial droop and aphasia.  Patient was found to be hypotensive after EMS arrived.  Patient was brought in the ED as stroke code,  his symptoms has improved by the time patient arrived in the ED.  CT head showed evolving acute to early subacute right PCA distribution infarct.   Assessment & Plan:   Principal Problem:   TIA (transient ischemic attack) Active Problems:   Coronary artery disease s/p CABG 2001   Hypothyroidism   Venous thromboembolism (VTE)   Hypertension   Type 2 diabetes mellitus with hyperglycemia ([name])   Dyslipidemia due to type 2 diabetes mellitus ([name])   BPH (benign prostatic hyperplasia)   Normocytic anemia   Prolonged QT interval   Acute CVA (cerebrovascular accident) ([name])   Aphasia/ right facial droop secondary to right occipital stroke: Patient presented with right-sided weakness, right facial droop and aphasia. Symptoms resolved by the time patient arrived in the ED. CT head showed evolving acute to early subacute right PCA distribution infarct. Continue telemetry. Frequent neurochecks. PT/OT > skilled nursing facility. Hemoglobin A1c 7.5 Lipid profile shows LDL 37. Echocardiogram showed EF 60 to 65%.  No embolic source. Continue aspirin and warfarin. Patient was on warfarin before admission. MRI: Small to moderate size acute infarction in the inferior right occipital lobe. No hemorrhage. Neuro hospitalist team is following.  Patient had a stroke  despite being on warfarin. Neurology has recommended switching to Eliquis. Discussion about Eliquis has to be done by outpatient cardiologist and oncologist.   Coronary artery disease s/p CABG 2001 Continue carvedilol, aspirin and statin.  Hypothyroidism Continue levothyroxine 100 mcg p.o. daily.  Venous thromboembolism (VTE) No need to hold [facility] per neurology. Continue warfarin per pharmacy.  Hypertension Held meds, resume his blood pressure improves Wife report patient has low blood pressure after he stands up. Will check orthostatic hypotension.  Type 2 diabetes mellitus with hyperglycemia ([name]) Carbohydrate modified diet. Continue Lantus 13 units SQ daily. CBG monitoring with RI SS. Hemoglobin A1c 7.4  Dyslipidemia due to type 2 diabetes mellitus ([name])  Continue rosuvastatin 20 mg p.o. daily.  BPH (benign prostatic hyperplasia) Continue tamsulosin 0.4 mg p.o. daily.  Normocytic anemia Monitor H&H.  Urine culture positive for Pseudomonas: Patient started on Keflex at admission. UA not significant. Urine culture grew 100,000 colonies of Pseudomonas. Consider starting cefepime.  DVT prophylaxis: Coumadin  code Status: Partial Family Communication: Wife and son at bedside discussed in detail. Disposition Plan:    Status is: Inpatient  Remains inpatient appropriate because:Inpatient level of care appropriate due to severity of illness   Dispo: The patient is from: Home              Anticipated d/c is to: SNF              Patient currently is not medically stable to d/c.   Difficult to place patient No   Consultants:   Neurology  Procedures: Echocardiogram, MRI. Antimicrobials:   Anti-infectives (From admission, onward)   Start     Dose/Rate Route Frequency Ordered Stop   08/31/20 1000  cephALEXin (KEFLEX) capsule 500 mg        500 mg Oral Every 12 hours 08/31/20 0741 09/06/20 0959      Subjective: Patient was seen and examined at bedside.   He reports feeling better,  awake and alert oriented x2.   Patient reports he was leaning towards the right when he had a stroke but now he feels improved.  Objective: Vitals:   08/31/20 2334 09/01/20 0401 09/01/20 0842 09/01/20 1238  BP: (!) 158/68 127/66 (!) 149/73 (!) 169/78  Pulse: 69 68 70 69  Resp: 18 19 18 18   Temp: 97.6 F (36.4 C) 98 F (36.7 C) 98.3 F (36.8 C) 97.7 F (36.5 C)  TempSrc: Oral Oral Oral Oral  SpO2: 98% 99% 96% 98%  Weight:      Height:        Intake/Output Summary (Last 24 hours) at 09/01/2020 1514 Last data filed at 09/01/2020 1240 Gross per 24 hour  Intake 2024.64 ml  Output 3710 ml  Net -1685.36 ml   Filed Weights   08/31/20 0000  Weight: 77.7 kg    Examination:  General exam: Appears calm and comfortable, not in any acute distress. Respiratory system: Clear to auscultation. Respiratory effort normal. Cardiovascular system: S1 & S2 heard, RRR. No JVD, murmurs, rubs, gallops or clicks. No pedal edema. Gastrointestinal system: Abdomen is nondistended, soft and nontender. No organomegaly or masses felt. Normal bowel sounds heard. Central nervous system: Alert and oriented x2. No focal neurological deficits. Extremities: Symmetric 5 x 5 power.  No edema, no cyanosis, no clubbing. Skin: No rashes, lesions or ulcers Psychiatry: Judgement and insight appear normal. Mood & affect appropriate.     Data Reviewed: I have personally reviewed following labs and imaging studies  CBC: Recent Labs  Lab 08/27/20 1526 08/31/20 0059 08/31/20 0101 09/01/20 0337  WBC 10.8* 10.7*  --  10.1  NEUTROABS 5.8 6.4  --   --   HGB 11.1* 11.0* 10.5* 10.4*  HCT 33.6* 33.4* 31.0* 30.7*  MCV 97.1 98.8  --  96.8  PLT 213 194  --  184   Basic Metabolic Panel: Recent Labs  Lab 08/27/20 1526 08/31/20 0059 08/31/20 0101 08/31/20 0523 09/01/20 0337  NA 136 135 138  --  136  K 4.7 4.4 4.4  --  4.8  CL 108 104 103  --  106  CO2 20* 21*  --   --  22  GLUCOSE  149* 214* 208*  --  180*  BUN 23 30* 31*  --  26*  CREATININE 1.16 1.37* 1.40*  --  1.21  CALCIUM 8.8* 8.9  --   --  9.0  MG  --   --   --  1.7 2.0   GFR: Estimated Creatinine Clearance: 40 mL/min (by C-G formula based on SCr of 1.21 mg/dL). Liver Function Tests: Recent Labs  Lab 08/27/20 1526 08/31/20 0059  AST 25 29  ALT 29 36  ALKPHOS 56 41  BILITOT 0.2* 0.5  PROT 7.6 6.5  ALBUMIN 3.8 3.3*   No results for input(s): LIPASE, AMYLASE in the last 168 hours. No results for input(s): AMMONIA in the last 168 hours. Coagulation Profile: Recent Labs  Lab 08/31/20 0059 09/01/20 0337  INR 2.6* 3.3*   Cardiac Enzymes: No results for input(s): CKTOTAL, CKMB, CKMBINDEX, TROPONINI in the last 168 hours. BNP (last 3 results) No results for input(s): PROBNP in the last 8760 hours. HbA1C: Recent Labs    08/31/20 0523  HGBA1C  7.5*   CBG: Recent Labs  Lab 08/31/20 0054 08/31/20 1815  GLUCAP 199* 156*   Lipid Profile: Recent Labs    08/31/20 0523  CHOL 90  HDL 31*  LDLCALC 37  TRIG 491  CHOLHDL 2.9   Thyroid Function Tests: No results for input(s): TSH, T4TOTAL, FREET4, T3FREE, THYROIDAB in the last 72 hours. Anemia Panel: No results for input(s): VITAMINB12, FOLATE, FERRITIN, TIBC, IRON, RETICCTPCT in the last 72 hours. Sepsis Labs: No results for input(s): PROCALCITON, LATICACIDVEN in the last 168 hours.  Recent Results (from the past 240 hour(s))  SARS CORONAVIRUS 2 (TAT 6-24 HRS) Nasopharyngeal Nasopharyngeal Swab     Status: None   Collection Time: 08/31/20  2:47 AM   Specimen: Nasopharyngeal Swab  Result Value Ref Range Status   SARS Coronavirus 2 NEGATIVE NEGATIVE Final    Comment: (NOTE) SARS-CoV-2 target nucleic acids are NOT DETECTED.  The SARS-CoV-2 RNA is generally detectable in upper and lower respiratory specimens during the acute phase of infection. Negative results do not preclude SARS-CoV-2 infection, do not rule out co-infections with other  pathogens, and should not be used as the sole basis for treatment or other patient management decisions. Negative results must be combined with clinical observations, patient history, and epidemiological information. The expected result is Negative.  Fact Sheet for Patients: HairSlick.no  Fact Sheet for Healthcare Providers: quierodirigir.com  This test is not yet approved or cleared by the [place] FDA and  has been authorized for detection and/or diagnosis of SARS-CoV-2 by FDA under an Emergency Use Authorization (EUA). This EUA will remain  in effect (meaning this test can be used) for the duration of the COVID-19 declaration under Se ction 564(b)(1) of the Act, 21 U.S.C. section 360bbb-3(b)(1), unless the authorization is terminated or revoked sooner.  Performed at [facility], [address] [place], [place] [address]   Culture, Urine     Status: Abnormal (Preliminary result)   Collection Time: 08/31/20  9:13 AM   Specimen: Urine, Random  Result Value Ref Range Status   Specimen Description URINE, RANDOM  Final   Special Requests NONE  Final   Culture (A)  Final    >=100,000 COLONIES/mL PSEUDOMONAS AERUGINOSA SUSCEPTIBILITIES TO FOLLOW Performed at [facility], [address], [place], [place] [address]    Report Status PENDING  Incomplete     Radiology Studies: CT ANGIO HEAD W OR WO CONTRAST  Result Date: 08/31/2020 CLINICAL DATA:  Right-sided deficits and right facial droop. EXAM: CT ANGIOGRAPHY HEAD AND NECK TECHNIQUE: Multidetector CT imaging of the head and neck was performed using the standard protocol during bolus administration of intravenous contrast. Multiplanar CT image reconstructions and MIPs were obtained to evaluate the vascular anatomy. Carotid stenosis measurements (when applicable) are obtained utilizing NASCET criteria, using the distal internal carotid diameter as the denominator.  CONTRAST:  33mL OMNIPAQUE IOHEXOL 350 MG/ML SOLN COMPARISON:  Head CT from earlier today FINDINGS: CTA NECK FINDINGS Aortic arch: 2 vessel arch with atheromatous calcification. Right carotid system: Diffuse atheromatous wall thickening with superimposed calcified plaques. Irregular plaque at the left ICA bifurcation which causes 60% stenosis at the ICA origin. No ulceration or dissection seen. Left carotid system: Diffuse atheromatous plaque which is mixed density. No flow limiting stenosis or ulceration. Vertebral arteries: No proximal subclavian stenosis. Diminutive right vertebral artery, likely both congenital and atheromatous, but patent to the dura. Mild atheromatous narrowing at the left vertebral origin. Skeleton: Advanced degenerative facet spurring with multilevel  endplate spurring. No acute osseous finding. Other neck: No acute finding. Upper chest: Negative Review of the MIP images confirms the above findings CTA HEAD FINDINGS Anterior circulation: Diffuse calcified plaque along the carotid siphons. Approximately 50% stenosis of the right ICA at the paraclinoid segment. Diffuse atheromatous irregularity and moderate to advanced narrowing beginning at the M1 segments and affecting the right more than left MCA branches. The ACA distribution is equally affected and there is bilateral severe Peri callosal narrowings Posterior circulation: Calcified plaque on both V4 segments. The non dominant right V4 segment ends in the right PICA. Diffuse atheromatous irregularity of the left vertebral and basilar arteries with a moderate mid basilar stenosis. Fetal type right PCA. Extensive atheromatous irregularity to bilateral posterior cerebral arteries especially at the P3 segments and beyond. Venous sinuses: Diffusely patent high density left-sided venous system attributed to reflux from the chest. The left brachiocephalic vein is stenotic in the setting of pacer. Anatomic variants: Fetal type right PCA Review of the  MIP images confirms the above findings IMPRESSION: 1. No emergent finding. 2. Severe intracranial atherosclerosis with widespread and high-grade stenoses. 3. Atherosclerosis in the neck with up to 60% narrowing at the right ICA origin. 4. Stenotic left brachiocephalic vein in the setting of pacer lead. Electronically Signed   By: Marnee Spring M.D.   On: 08/31/2020 08:54   CT ANGIO NECK W OR WO CONTRAST  Result Date: 08/31/2020 CLINICAL DATA:  Right-sided deficits and right facial droop. EXAM: CT ANGIOGRAPHY HEAD AND NECK TECHNIQUE: Multidetector CT imaging of the head and neck was performed using the standard protocol during bolus administration of intravenous contrast. Multiplanar CT image reconstructions and MIPs were obtained to evaluate the vascular anatomy. Carotid stenosis measurements (when applicable) are obtained utilizing NASCET criteria, using the distal internal carotid diameter as the denominator. CONTRAST:  75mL OMNIPAQUE IOHEXOL 350 MG/ML SOLN COMPARISON:  Head CT from earlier today FINDINGS: CTA NECK FINDINGS Aortic arch: 2 vessel arch with atheromatous calcification. Right carotid system: Diffuse atheromatous wall thickening with superimposed calcified plaques. Irregular plaque at the left ICA bifurcation which causes 60% stenosis at the ICA origin. No ulceration or dissection seen. Left carotid system: Diffuse atheromatous plaque which is mixed density. No flow limiting stenosis or ulceration. Vertebral arteries: No proximal subclavian stenosis. Diminutive right vertebral artery, likely both congenital and atheromatous, but patent to the dura. Mild atheromatous narrowing at the left vertebral origin. Skeleton: Advanced degenerative facet spurring with multilevel endplate spurring. No acute osseous finding. Other neck: No acute finding. Upper chest: Negative Review of the MIP images confirms the above findings CTA HEAD FINDINGS Anterior circulation: Diffuse calcified plaque along the carotid  siphons. Approximately 50% stenosis of the right ICA at the paraclinoid segment. Diffuse atheromatous irregularity and moderate to advanced narrowing beginning at the M1 segments and affecting the right more than left MCA branches. The ACA distribution is equally affected and there is bilateral severe Peri callosal narrowings Posterior circulation: Calcified plaque on both V4 segments. The non dominant right V4 segment ends in the right PICA. Diffuse atheromatous irregularity of the left vertebral and basilar arteries with a moderate mid basilar stenosis. Fetal type right PCA. Extensive atheromatous irregularity to bilateral posterior cerebral arteries especially at the P3 segments and beyond. Venous sinuses: Diffusely patent high density left-sided venous system attributed to reflux from the chest. The left brachiocephalic vein is stenotic in the setting of pacer. Anatomic variants: Fetal type right PCA Review of the MIP images confirms the above findings  IMPRESSION: 1. No emergent finding. 2. Severe intracranial atherosclerosis with widespread and high-grade stenoses. 3. Atherosclerosis in the neck with up to 60% narrowing at the right ICA origin. 4. Stenotic left brachiocephalic vein in the setting of pacer lead. Electronically Signed   By: Marnee Spring M.D.   On: 08/31/2020 08:54   MR BRAIN WO CONTRAST  Result Date: 09/01/2020 CLINICAL DATA:  Right PCA stroke EXAM: MRI HEAD WITHOUT CONTRAST TECHNIQUE: Multiplanar, multiecho pulse sequences of the brain and surrounding structures were obtained without intravenous contrast. COMPARISON:  08/19/2020 FINDINGS: Brain: Small to moderate size area restricted diffusion within the inferior right occipital lobe. No evidence of hemorrhage. There is no intracranial mass or significant mass effect. Prominence of the ventricles and sulci reflects stable parenchymal volume loss. Confluent areas of T2 hyperintensity in the supratentorial greater than pontine white matter  are nonspecific but probably reflects stable advanced chronic microvascular ischemic changes. Several chronic small vessel infarcts are again identified including involvement of thalamus, basal ganglia, and cerebellum bilaterally. Small chronic lateral right temporal infarct. Vascular: Major vessel flow voids at the skull base are preserved. Skull and upper cervical spine: Normal marrow signal is preserved. Sinuses/Orbits: Minor mucosal thickening. Bilateral lens replacements. Other: Sella is unremarkable.  Mastoid air cells are clear. IMPRESSION: Small to moderate size acute infarction of the inferior right occipital lobe. No hemorrhage. Stable chronic findings detailed above with no additional change since 08/19/2020. Electronically Signed   By: Guadlupe Spanish M.D.   On: 09/01/2020 11:49   CT HEAD CODE STROKE WO CONTRAST  Result Date: 08/31/2020 CLINICAL DATA:  Code stroke. Initial evaluation for acute right-sided deficits, right facial droop. EXAM: CT HEAD WITHOUT CONTRAST TECHNIQUE: Contiguous axial images were obtained from the base of the skull through the vertex without intravenous contrast. COMPARISON:  Previous MRI from 08/19/2020 FINDINGS: Brain: Generalized age-related cerebral atrophy with advanced chronic microvascular ischemic disease. Associated remote lacunar infarcts noted within the bilateral thalami. Additional small remote right temporal lobe infarct. There is new wedge-shaped parenchymal hypodensity involving the right occipital pole, consistent with an evolving acute to early subacute right PCA territory infarct. No associated hemorrhage or mass effect. No other visible acute large vessel territory infarct. No intracranial hemorrhage elsewhere within the brain. No mass lesion, midline shift or mass effect. No hydrocephalus or extra-axial fluid collection. Vascular: No hyperdense vessel. Calcified atherosclerosis present at skull base. Skull: Scalp soft tissues and calvarium within normal  limits. Sinuses/Orbits: Globes and orbital soft tissues demonstrate no acute finding. Scattered mucosal thickening noted within the ethmoidal air cells. Small right maxillary sinus retention cyst. No mastoid effusion. Other: None. ASPECTS [facility] Stroke Program Early CT Score) - Ganglionic level infarction (caudate, lentiform nuclei, internal capsule, insula, M1-M3 cortex): 7 - Supraganglionic infarction (M4-M6 cortex): 3 Total score (0-10 with 10 being normal): 10 IMPRESSION: 1. Evolving acute to early subacute right PCA distribution infarct, new as compared to recent MRI from 08/19/2020. No associated hemorrhage or mass effect. 2. No other acute intracranial abnormality. 3. ASPECTS is 10. 4. Underlying age-related cerebral atrophy with advanced chronic microvascular ischemic disease, with additional chronic right temporal lobe infarct. These results were communicated to Dr. Thomasena Edis at 1:18 amon 3/6/2022by text page via the [facility] messaging system. Electronically Signed   By: Rise Mu M.D.   On: 08/31/2020 01:21   Scheduled Meds: . aspirin  81 mg Oral Daily  . cephALEXin  500 mg Oral Q12H  . cholecalciferol  1,000 Units Oral Daily  . feeding supplement  237 mL Oral BID BM  . insulin glargine  13 Units Subcutaneous QHS  . levothyroxine  100 mcg Oral Daily  . mirtazapine  7.5 mg Oral QHS  . [START ON 09/02/2020] multivitamin with minerals  1 tablet Oral Daily  . rosuvastatin  20 mg Oral QHS  . tamsulosin  0.4 mg Oral QPC supper  . Warfarin - Pharmacist Dosing Inpatient   Does not apply q1600   Continuous Infusions:   LOS: 1 day    Time spent: 25 mins    Kasha Howeth, MD [facility]   If 7PM-7AM, please contact night-coverage

## 2020-09-01 NOTE — Progress Notes (Signed)
Pharmacy Antibiotic Note  Thomas [name] is a 87 y.o. male on day #2 Cephalexin empiric coverage for UTI. Culture now growing Pseudomonas.  Pharmacy has been consulted for Cefepime dosing.  Afebrile, WBC normal.   Plan:  Discontinue Cephalexin.  Begin Cefepime 2gm IV q24h.  Follow up sensitivities, renal function and length of therapy.  Height: 5\' 8"  (172.7 cm) Weight: 77.7 kg (171 lb 4.8 oz) IBW/kg (Calculated) : 68.4  Temp (24hrs), Avg:98 F (36.7 C), Min:97.6 F (36.4 C), Max:98.3 F (36.8 C)  Recent Labs  Lab 08/27/20 1526 08/31/20 0059 08/31/20 0101 09/01/20 0337  WBC 10.8* 10.7*  --  10.1  CREATININE 1.16 1.37* 1.40* 1.21    Estimated Creatinine Clearance: 40 mL/min (by C-G formula based on SCr of 1.21 mg/dL).    Allergies  Allergen Reactions  . Amitriptyline   . Effexor Xr [Venlafaxine]   . Lyrica [Pregabalin]   . Morphine   . Morphine And Related   . Pravachol [Pravastatin]   . Zolpidem     Antimicrobials this admission:  Cephalexin 3/6>>3/7  Cefepime 3/7 >>  Dose adjustments this admission:  n/a  Microbiology results:  3/6 urine: > 100 K/ml Pseudomonas - sensitivites pending  3/6 COVID: negative  Thank you for allowing pharmacy to be a part of this patient's care.  5/7, Dennie Fetters 09/01/2020 4:23 PM

## 2020-09-01 NOTE — Plan of Care (Signed)
  Problem: Education: Goal: Knowledge of disease or condition will improve Outcome: Progressing Goal: Knowledge of secondary prevention will improve Outcome: Progressing Goal: Knowledge of patient specific risk factors addressed and post discharge goals established will improve Outcome: Progressing Goal: Individualized Educational Video(s) Outcome: Progressing   Problem: Coping: Goal: Will verbalize positive feelings about self Outcome: Progressing   

## 2020-09-02 ENCOUNTER — Ambulatory Visit: Payer: Medicare Other | Admitting: Physician Assistant

## 2020-09-02 LAB — URINE CULTURE: Culture: >=100000 — AB

## 2020-09-02 LAB — CBC
HCT: 32.2 % — ABNORMAL LOW (ref 39.0–52.0)
Hemoglobin: 10.6 g/dL — ABNORMAL LOW (ref 13.0–17.0)
MCH: 32.7 pg (ref 26.0–34.0)
MCHC: 32.9 g/dL (ref 30.0–36.0)
MCV: 99.4 fL (ref 80.0–100.0)
Platelets: 180 10*3/uL (ref 150–400)
RBC: 3.24 MIL/uL — ABNORMAL LOW (ref 4.22–5.81)
RDW: 13 % (ref 11.5–15.5)
WBC: 10.3 10*3/uL (ref 4.0–10.5)
nRBC: 0 % (ref 0.0–0.2)

## 2020-09-02 LAB — SARS CORONAVIRUS 2 (TAT 6-24 HRS): SARS Coronavirus 2: NEGATIVE

## 2020-09-02 LAB — BASIC METABOLIC PANEL
Anion gap: 10 (ref 5–15)
BUN: 24 mg/dL — ABNORMAL HIGH (ref 8–23)
CO2: 21 mmol/L — ABNORMAL LOW (ref 22–32)
Calcium: 9.3 mg/dL (ref 8.9–10.3)
Chloride: 105 mmol/L (ref 98–111)
Creatinine, Ser: 1.13 mg/dL (ref 0.61–1.24)
GFR, Estimated: >60 mL/min (ref >60)
Glucose, Bld: 198 mg/dL — ABNORMAL HIGH (ref 70–99)
Potassium: 4.5 mmol/L (ref 3.5–5.1)
Sodium: 136 mmol/L (ref 135–145)

## 2020-09-02 LAB — PROTIME-INR
INR: 2.7 — ABNORMAL HIGH (ref 0.8–1.2)
Prothrombin Time: 27.7 seconds — ABNORMAL HIGH (ref 11.4–15.2)

## 2020-09-02 LAB — PHOSPHORUS: Phosphorus: 3.1 mg/dL (ref 2.5–4.6)

## 2020-09-02 LAB — MAGNESIUM: Magnesium: 1.7 mg/dL (ref 1.7–2.4)

## 2020-09-02 MED ORDER — WARFARIN SODIUM 3 MG PO TABS
3.0000 mg | ORAL_TABLET | Freq: Once | ORAL | Status: AC
  Administered 2020-09-02: 3 mg via ORAL
  Filled 2020-09-02: qty 1

## 2020-09-02 MED ORDER — STROKE: EARLY STAGES OF RECOVERY BOOK
Status: AC
  Filled 2020-09-02: qty 1

## 2020-09-02 NOTE — Progress Notes (Signed)
PROGRESS NOTE    Thomas [name]  JSE:[id number] DOB: 03/27/1929 DOA: 08/31/2020 PCP: Ardith Dark, MD   Brief Narrative:  This 87 years old male with PMH significant for osteoarthritis at multiple sites, history of DVT, CAD/CABG x 6, history of angioplasty with stent placement, type 2 diabetes, hyperlipidemia, hypertension, hypothyroidism, chronic kidney disease, hearing impairment, macular degeneration, history of nonhemorrhagic stroke brought in the ED with right-sided weakness along with right sided facial droop and aphasia.  Patient was found to be hypotensive after EMS arrived.  Patient was brought in the ED as stroke code,  his symptoms has improved by the time patient arrived in the ED.  CT head showed evolving acute to early subacute right PCA distribution infarct.   Assessment & Plan:   Principal Problem:   TIA (transient ischemic attack) Active Problems:   Coronary artery disease s/p CABG 2001   Hypothyroidism   Venous thromboembolism (VTE)   Hypertension   Type 2 diabetes mellitus with hyperglycemia ([name])   Dyslipidemia due to type 2 diabetes mellitus ([name])   BPH (benign prostatic hyperplasia)   Normocytic anemia   Prolonged QT interval   Acute CVA (cerebrovascular accident) ([name])   Aphasia/ right facial droop secondary to right occipital stroke: Patient presented with right-sided weakness, right facial droop and aphasia. Symptoms resolved by the time patient arrived in the ED. CT head showed evolving acute to early subacute right PCA distribution infarct. Continue telemetry. Frequent neurochecks. PT/OT > skilled nursing facility. Hemoglobin A1c 7.5 Lipid profile shows LDL 37. Echocardiogram showed EF 60 to 65%.  No embolic source. Continue aspirin and warfarin. Patient was on warfarin before admission. MRI: Small to moderate size acute infarction in the inferior right occipital lobe. No hemorrhage. Neuro hospitalist team is following.  Patient had a stroke  despite being on warfarin. Neurology has recommended switching to Eliquis. Discussion about Eliquis has to be done by outpatient cardiologist and oncologist. Cardiologist recommended to continue Coumadin. Oncologist recommended to switch to Eliquis.  Coronary artery disease s/p CABG 2001 Continue carvedilol, aspirin and statin.  Hypothyroidism Continue levothyroxine 100 mcg p.o. daily.  Venous thromboembolism (VTE) No need to hold [facility] per neurology. Continue warfarin per pharmacy.  Hypertension Held meds, resume his blood pressure improves Wife report patient has low blood pressure after he stands up. Will check orthostatic hypotension.  Type 2 diabetes mellitus with hyperglycemia ([name]) Carbohydrate modified diet. Continue Lantus 13 units SQ daily. CBG monitoring with RI SS. Hemoglobin A1c 7.4  Dyslipidemia due to type 2 diabetes mellitus ([name])  Continue rosuvastatin 20 mg p.o. daily.  BPH (benign prostatic hyperplasia) Continue tamsulosin 0.4 mg p.o. daily.  Normocytic anemia Monitor H&H.  Urine culture positive for Pseudomonas: Patient started on Keflex at admission. UA not significant. Urine culture grew 100,000 colonies of Pseudomonas. Started cefepime on 3/7  DVT prophylaxis: Coumadin  code Status: Partial Family Communication: Wife and son at bedside discussed in detail. Disposition Plan:    Status is: Inpatient  Remains inpatient appropriate because:Inpatient level of care appropriate due to severity of illness   Dispo: The patient is from: Home              Anticipated d/c is to: SNF              Patient currently is not medically stable to d/c.   Difficult to place patient No   Consultants:   Neurology  Procedures: Echocardiogram, MRI. Antimicrobials:   Anti-infectives (From admission, onward)   Start  Dose/Rate Route Frequency Ordered Stop   09/01/20 1700  ceFEPIme (MAXIPIME) 2 g in sodium chloride 0.9 % 100 mL IVPB        2  g 200 mL/hr over 30 Minutes Intravenous Every 24 hours 09/01/20 1618     08/31/20 1000  cephALEXin (KEFLEX) capsule 500 mg  Status:  Discontinued        500 mg Oral Every 12 hours 08/31/20 0741 09/01/20 1618      Subjective: Patient was seen and examined at bedside.  He reports feeling improved,  awake and alert oriented x2.   Patient reports he was leaning towards the right when he had a stroke but now he feels improved. Patient reports having good night sleep last night.  Objective: Vitals:   09/01/20 2330 09/02/20 0355 09/02/20 0718 09/02/20 1208  BP: (!) 169/63 (!) 142/59 (!) 159/57 (!) 127/58  Pulse: 69 78 70 73  Resp: 18 18 18 18   Temp: 99 F (37.2 C) 98.3 F (36.8 C) 98.2 F (36.8 C) 97.8 F (36.6 C)  TempSrc: Oral Oral Oral Oral  SpO2: 97% 97% 98% 96%  Weight:      Height:        Intake/Output Summary (Last 24 hours) at 09/02/2020 1350 Last data filed at 09/02/2020 0900 Gross per 24 hour  Intake 320 ml  Output 1300 ml  Net -980 ml   Filed Weights   08/31/20 0000  Weight: 77.7 kg    Examination:  General exam: Appears calm and comfortable, not in any acute distress. Respiratory system: Clear to auscultation. Respiratory effort normal. Cardiovascular system: S1 & S2 heard, RRR. No JVD, murmurs, rubs, gallops or clicks. No pedal edema. Gastrointestinal system: Abdomen is nondistended, soft and nontender. No organomegaly or masses felt. Normal bowel sounds heard. Central nervous system: Alert and oriented x2. No focal neurological deficits. Extremities: Symmetric 5 x 5 power.  No edema, no cyanosis, no clubbing. Skin: No rashes, lesions or ulcers Psychiatry: Judgement and insight appear normal. Mood & affect appropriate.     Data Reviewed: I have personally reviewed following labs and imaging studies  CBC: Recent Labs  Lab 08/27/20 1526 08/31/20 0059 08/31/20 0101 09/01/20 0337 09/02/20 0405  WBC 10.8* 10.7*  --  10.1 10.3  NEUTROABS 5.8 6.4  --   --    --   HGB 11.1* 11.0* 10.5* 10.4* 10.6*  HCT 33.6* 33.4* 31.0* 30.7* 32.2*  MCV 97.1 98.8  --  96.8 99.4  PLT 213 194  --  184 180   Basic Metabolic Panel: Recent Labs  Lab 08/27/20 1526 08/31/20 0059 08/31/20 0101 08/31/20 0523 09/01/20 0337 09/02/20 0405  NA 136 135 138  --  136 136  K 4.7 4.4 4.4  --  4.8 4.5  CL 108 104 103  --  106 105  CO2 20* 21*  --   --  22 21*  GLUCOSE 149* 214* 208*  --  180* 198*  BUN 23 30* 31*  --  26* 24*  CREATININE 1.16 1.37* 1.40*  --  1.21 1.13  CALCIUM 8.8* 8.9  --   --  9.0 9.3  MG  --   --   --  1.7 2.0 1.7  PHOS  --   --   --   --   --  3.1   GFR: Estimated Creatinine Clearance: 42.9 mL/min (by C-G formula based on SCr of 1.13 mg/dL). Liver Function Tests: Recent Labs  Lab 08/27/20 1526 08/31/20 0059  AST 25 29  ALT 29 36  ALKPHOS 56 41  BILITOT 0.2* 0.5  PROT 7.6 6.5  ALBUMIN 3.8 3.3*   No results for input(s): LIPASE, AMYLASE in the last 168 hours. No results for input(s): AMMONIA in the last 168 hours. Coagulation Profile: Recent Labs  Lab 08/31/20 0059 09/01/20 0337 09/02/20 0405  INR 2.6* 3.3* 2.7*   Cardiac Enzymes: No results for input(s): CKTOTAL, CKMB, CKMBINDEX, TROPONINI in the last 168 hours. BNP (last 3 results) No results for input(s): PROBNP in the last 8760 hours. HbA1C: Recent Labs    08/31/20 0523  HGBA1C 7.5*   CBG: Recent Labs  Lab 08/31/20 0054 08/31/20 1815  GLUCAP 199* 156*   Lipid Profile: Recent Labs    08/31/20 0523  CHOL 90  HDL 31*  LDLCALC 37  TRIG [id number]  CHOLHDL 2.9   Thyroid Function Tests: No results for input(s): TSH, T4TOTAL, FREET4, T3FREE, THYROIDAB in the last 72 hours. Anemia Panel: No results for input(s): VITAMINB12, FOLATE, FERRITIN, TIBC, IRON, RETICCTPCT in the last 72 hours. Sepsis Labs: No results for input(s): PROCALCITON, LATICACIDVEN in the last 168 hours.  Recent Results (from the past 240 hour(s))  SARS CORONAVIRUS 2 (TAT 6-24 HRS) Nasopharyngeal  Nasopharyngeal Swab     Status: None   Collection Time: 08/31/20  2:47 AM   Specimen: Nasopharyngeal Swab  Result Value Ref Range Status   SARS Coronavirus 2 NEGATIVE NEGATIVE Final    Comment: (NOTE) SARS-CoV-2 target nucleic acids are NOT DETECTED.  The SARS-CoV-2 RNA is generally detectable in upper and lower respiratory specimens during the acute phase of infection. Negative results do not preclude SARS-CoV-2 infection, do not rule out co-infections with other pathogens, and should not be used as the sole basis for treatment or other patient management decisions. Negative results must be combined with clinical observations, patient history, and epidemiological information. The expected result is Negative.  Fact Sheet for Patients: HairSlick.nohttps://www.fda.gov/media/138098/download  Fact Sheet for Healthcare Providers: quierodirigir.comhttps://www.fda.gov/media/138095/download  This test is not yet approved or cleared by the [place] FDA and  has been authorized for detection and/or diagnosis of SARS-CoV-2 by FDA under an Emergency Use Authorization (EUA). This EUA will remain  in effect (meaning this test can be used) for the duration of the COVID-19 declaration under Se ction 564(b)(1) of the Act, 21 U.S.C. section 360bbb-3(b)(1), unless the authorization is terminated or revoked sooner.  Performed at [facility], [address] [place], [place] [address]   Culture, Urine     Status: Abnormal   Collection Time: 08/31/20  9:13 AM   Specimen: Urine, Random  Result Value Ref Range Status   Specimen Description URINE, RANDOM  Final   Special Requests NONE  Final   Culture >=100,000 COLONIES/mL PSEUDOMONAS AERUGINOSA (A)  Final   Report Status 09/02/2020 FINAL  Final   Organism ID, Bacteria PSEUDOMONAS AERUGINOSA (A)  Final      Susceptibility   Pseudomonas aeruginosa - MIC*    CEFTAZIDIME 2 SENSITIVE Sensitive     CIPROFLOXACIN <=0.25 SENSITIVE Sensitive     GENTAMICIN <=1  SENSITIVE Sensitive     IMIPENEM 1 SENSITIVE Sensitive     PIP/TAZO <=4 SENSITIVE Sensitive     CEFEPIME 2 SENSITIVE Sensitive     * >=100,000 COLONIES/mL PSEUDOMONAS AERUGINOSA     Radiology Studies: MR BRAIN WO CONTRAST  Result Date: 09/01/2020 CLINICAL DATA:  Right PCA stroke EXAM: MRI HEAD WITHOUT CONTRAST TECHNIQUE: Multiplanar, multiecho pulse sequences of the brain  and surrounding structures were obtained without intravenous contrast. COMPARISON:  08/19/2020 FINDINGS: Brain: Small to moderate size area restricted diffusion within the inferior right occipital lobe. No evidence of hemorrhage. There is no intracranial mass or significant mass effect. Prominence of the ventricles and sulci reflects stable parenchymal volume loss. Confluent areas of T2 hyperintensity in the supratentorial greater than pontine white matter are nonspecific but probably reflects stable advanced chronic microvascular ischemic changes. Several chronic small vessel infarcts are again identified including involvement of thalamus, basal ganglia, and cerebellum bilaterally. Small chronic lateral right temporal infarct. Vascular: Major vessel flow voids at the skull base are preserved. Skull and upper cervical spine: Normal marrow signal is preserved. Sinuses/Orbits: Minor mucosal thickening. Bilateral lens replacements. Other: Sella is unremarkable.  Mastoid air cells are clear. IMPRESSION: Small to moderate size acute infarction of the inferior right occipital lobe. No hemorrhage. Stable chronic findings detailed above with no additional change since 08/19/2020. Electronically Signed   By: Guadlupe Spanish M.D.   On: 09/01/2020 11:49   Scheduled Meds: . aspirin  81 mg Oral Daily  . cholecalciferol  1,000 Units Oral Daily  . feeding supplement  237 mL Oral BID BM  . insulin glargine  13 Units Subcutaneous QHS  . levothyroxine  100 mcg Oral Daily  . mirtazapine  7.5 mg Oral QHS  . multivitamin with minerals  1 tablet Oral  Daily  . rosuvastatin  20 mg Oral QHS  . tamsulosin  0.4 mg Oral QPC supper  . warfarin  3 mg Oral ONCE-1600  . Warfarin - Pharmacist Dosing Inpatient   Does not apply q1600   Continuous Infusions: . ceFEPime (MAXIPIME) IV 2 g (09/01/20 1711)     LOS: 2 days    Time spent: 25 mins    Josephine Wooldridge, MD [facility]   If 7PM-7AM, please contact night-coverage

## 2020-09-02 NOTE — Evaluation (Signed)
Speech Language Pathology Evaluation Patient Details Name: Thomas [name] MRN: [id number] DOB: 1928-09-27 Today's Date: 09/02/2020 Time: 6629-4765 SLP Time Calculation (min) (ACUTE ONLY): 21 min  Problem List:  Patient Active Problem List   Diagnosis Date Noted  . TIA (transient ischemic attack) 08/31/2020  . Normocytic anemia 08/31/2020  . Prolonged QT interval 08/31/2020  . Acute CVA (cerebrovascular accident) ([name]) 08/31/2020  . Debility 08/21/2020  . Lytic bone lesion of femur 08/21/2020  . Sick sinus syndrome ([name]) 07/31/2020  . Coronary artery disease s/p CABG 2001 07/17/2020  . Hypothyroidism 07/17/2020  . Venous thromboembolism (VTE) 07/17/2020  . Hypertension 07/17/2020  . Cardiac pacemaker 07/17/2020  . Osteoarthritis 07/17/2020  . Type 2 diabetes mellitus with hyperglycemia ([name]) 07/17/2020  . Vitamin D deficiency 07/17/2020  . Macular degeneration 07/17/2020  . Dyslipidemia due to type 2 diabetes mellitus ([name]) 07/17/2020  . Insomnia 07/17/2020  . BPH (benign prostatic hyperplasia) 07/17/2020  . Colon polyps 07/17/2020   Past Medical History:  Past Medical History:  Diagnosis Date  . Arthritis   . Clotting disorder ([name])   . Coronary artery disease   . Diabetes ([name])   . Hyperlipidemia   . Hypertension   . Hypothyroidism 07/17/2020  . Kidney disease   . Loss of hearing   . Macular degeneration   . Myocardial infarction ([name])   . Osteoarthritis   . Stroke [facility])    Past Surgical History:  Past Surgical History:  Procedure Laterality Date  . CARPAL TUNNEL RELEASE  2011  . CORONARY ANGIOPLASTY WITH STENT PLACEMENT    . CORONARY ARTERY BYPASS GRAFT  2001   x6  . REPLACEMENT TOTAL KNEE    . SPINE SURGERY  2008   cervical and lumbar  . THYROIDECTOMY     HPI:  Pt is an 87 y/o male admitted 3/6 secondary to worsening R sided weakness. MRI of brain shows acute right occipital infarction. PMH includes CVA, CAD s/p CABG, HTN, macular degeneration, VTE, and  DM.   Assessment / Plan / Recommendation Clinical Impression  Pt presents with mild deficits in cognitive functioning. Pt declines change from baseline functioning however no family member or caregiver present to help determine prior cognitive function. Pt oriented x3, with mild deficits in temporal orientation to day of the month and week. PLOF spouse assists with all complex ADLS (med, financial management). He notes macular degeneration with glasses at baseline (currently at home) and [facility]. Pt with delayed novel recall 3/5, reduced mental manipulation, and decreased executive function skills. Receptive and expressive langauge skills as well as motor speech skills appeared intact. Will follow during acute stay to maximize cognitive linguistic recovery and clarify baseline cognitive functioning.    SLP Assessment  SLP Recommendation/Assessment: Patient needs continued Speech Lanaguage Pathology Services SLP Visit Diagnosis: Cognitive communication deficit (R41.841)    Follow Up Recommendations  Skilled Nursing facility    Frequency and Duration min 1 x/week  1 week      SLP Evaluation Cognition  Overall Cognitive Status: Difficult to assess Arousal/Alertness: Awake/alert Orientation Level: Oriented to person;Oriented to place;Oriented to situation;Other (comment);Disoriented to time (disoriented to day of month and day of the week) Attention: Focused;Sustained Focused Attention: Impaired Focused Attention Impairment: Verbal complex;Functional complex Sustained Attention: Impaired Sustained Attention Impairment: Verbal complex;Functional complex Awareness: Impaired Problem Solving: Impaired Problem Solving Impairment: Verbal complex;Functional complex Executive Function: Organizing;Sequencing Sequencing: Impaired Organizing: Impaired Safety/Judgment: Impaired       Community education officer Comprehension Overall Auditory Comprehension: Appears within functional limits  for tasks  assessed    Expression Expression Primary Mode of Expression: Verbal Verbal Expression Overall Verbal Expression: Appears within functional limits for tasks assessed Written Expression Dominant Hand: Right   Oral / Motor  Oral Motor/Sensory Function Overall Oral Motor/Sensory Function: Within functional limits Motor Speech Overall Motor Speech: Appears within functional limits for tasks assessed   GO                    Ardyth Gal MA, CCC-SLP Acute Rehabilitation Services   09/02/2020, 3:40 PM

## 2020-09-02 NOTE — TOC Progression Note (Signed)
Transition of Care [facility]) - Progression Note    Patient Details  Name: Thomas [name] MRN: [id number] Date of Birth: Jul 06, 1928  [facility]) CM/SW Contact  Kermit Balo, RN Phone Number: 09/02/2020, 11:36 AM  Clinical Narrative:    CM called pts son and provided the bed offers for SNF rehab. Pts son and wife have selected [facility]. CM has updated [facility] and started insurance auth process.  CM will ask for covid test once hear back from [place] on bed availability.  TOC following.   Expected Discharge Plan: Skilled Nursing Facility Barriers to Discharge: Continued Medical Work up  Expected Discharge Plan and Services Expected Discharge Plan: Skilled Nursing Facility In-house Referral: Clinical Social Work Discharge Planning Services: CM Consult Post Acute Care Choice: Skilled Nursing Facility Living arrangements for the past 2 months: Single Family Home                                       Social Determinants of Health (SDOH) Interventions    Readmission Risk Interventions No flowsheet data found.

## 2020-09-02 NOTE — Progress Notes (Signed)
ANTICOAGULATION CONSULT NOTE - Initial Consult  Pharmacy Consult for coumadin Indication: pulmonary embolus  Allergies  Allergen Reactions  . Amitriptyline   . Effexor Xr [Venlafaxine]   . Lyrica [Pregabalin]   . Morphine   . Morphine And Related   . Pravachol [Pravastatin]   . Zolpidem     Patient Measurements: Height: 5\' 8"  (172.7 cm) Weight: 77.7 kg (171 lb 4.8 oz) IBW/kg (Calculated) : 68.4   Vital Signs: Temp: 98.2 F (36.8 C) (03/08 0718) Temp Source: Oral (03/08 0718) BP: 159/57 (03/08 0718) Pulse Rate: 70 (03/08 0718)  Labs: Recent Labs    08/31/20 0059 08/31/20 0101 09/01/20 0337 09/02/20 0405  HGB 11.0* 10.5* 10.4* 10.6*  HCT 33.4* 31.0* 30.7* 32.2*  PLT 194  --  184 180  APTT 39*  --   --   --   LABPROT 27.0*  --  32.7* 27.7*  INR 2.6*  --  3.3* 2.7*  CREATININE 1.37* 1.40* 1.21 1.13    Estimated Creatinine Clearance: 42.9 mL/min (by C-G formula based on SCr of 1.13 mg/dL).   Medical History: Past Medical History:  Diagnosis Date  . Arthritis   . Clotting disorder ([name])   . Coronary artery disease   . Diabetes ([name])   . Hyperlipidemia   . Hypertension   . Hypothyroidism 07/17/2020  . Kidney disease   . Loss of hearing   . Macular degeneration   . Myocardial infarction ([name])   . Osteoarthritis   . Stroke [facility])     Medications:  See medication history  Assessment: 87 yo man to continue coumadin for h/o PE.  INR 2.6>>3.3>2.7 after holding dose yesterday, Hg 10.6, PTLC 180 Last dose coumadin PTA on 3/5 Per note, patient states hard to keep in therapeutic range. Patient also now on antibiotics which may increase INR.  PTA dose of coumadin 3mg  po q T,Th,Sat and 4mg  MWFSu.    Goal of Therapy:  INR 2-3 Monitor platelets by anticoagulation protocol: Yes   Plan:  Warfarin 3mg  PO x 1 today   Check INR daily Monitor for bleeding complications  Dwayne A. 92, PharmD, BCPS, FNKF Clinical Pharmacist [place] Please utilize Amion  for appropriate phone number to reach the unit pharmacist [facility])   09/02/2020,7:48 AM

## 2020-09-02 NOTE — Plan of Care (Signed)
  Problem: Education: Goal: Knowledge of disease or condition will improve Outcome: Progressing Goal: Knowledge of secondary prevention will improve Outcome: Progressing Goal: Knowledge of patient specific risk factors addressed and post discharge goals established will improve Outcome: Progressing Goal: Individualized Educational Video(s) Outcome: Progressing   Problem: Coping: Goal: Will verbalize positive feelings about self Outcome: Progressing   Problem: Education: Goal: Knowledge of General Education information will improve Description: Including pain rating scale, medication(s)/side effects and non-pharmacologic comfort measures Outcome: Progressing   Problem: Health Behavior/Discharge Planning: Goal: Ability to manage health-related needs will improve Outcome: Progressing   Problem: Clinical Measurements: Goal: Ability to maintain clinical measurements within normal limits will improve Outcome: Progressing Goal: Will remain free from infection Outcome: Progressing Goal: Diagnostic test results will improve Outcome: Progressing Goal: Respiratory complications will improve Outcome: Progressing Goal: Cardiovascular complication will be avoided Outcome: Progressing   Problem: Activity: Goal: Risk for activity intolerance will decrease Outcome: Progressing   Problem: Nutrition: Goal: Adequate nutrition will be maintained Outcome: Progressing   Problem: Coping: Goal: Level of anxiety will decrease Outcome: Progressing   Problem: Elimination: Goal: Will not experience complications related to bowel motility Outcome: Progressing Goal: Will not experience complications related to urinary retention Outcome: Progressing   Problem: Pain Managment: Goal: General experience of comfort will improve Outcome: Progressing   Problem: Safety: Goal: Ability to remain free from injury will improve Outcome: Progressing   Problem: Skin Integrity: Goal: Risk for impaired  skin integrity will decrease Outcome: Progressing   

## 2020-09-03 LAB — PROTIME-INR
INR: 2 — ABNORMAL HIGH (ref 0.8–1.2)
Prothrombin Time: 21.7 seconds — ABNORMAL HIGH (ref 11.4–15.2)

## 2020-09-03 LAB — GLUCOSE, CAPILLARY: Glucose-Capillary: 174 mg/dL — ABNORMAL HIGH (ref 70–99)

## 2020-09-03 MED ORDER — WARFARIN SODIUM 4 MG PO TABS
4.0000 mg | ORAL_TABLET | Freq: Once | ORAL | Status: AC
  Administered 2020-09-03: 4 mg via ORAL
  Filled 2020-09-03: qty 1

## 2020-09-03 MED ORDER — SODIUM CHLORIDE 0.9 % IV SOLN: INTRAVENOUS | Status: AC

## 2020-09-03 NOTE — Progress Notes (Signed)
Physical Therapy Treatment Patient Details Name: Thomas [name] MRN: [id number] DOB: May 30, 1929 Today's Date: 09/03/2020    History of Present Illness Pt is an 87 y/o male admitted 3/6 secondary to worsening R sided weakness. Workup pending, but CT showed acute to early subacute right PCA distribution. PMH includes CVA, CAD s/p CABG, HTN, macular degeneration, VTE, and DM.    PT Comments    Patient limited by orthostasis (see below). Patient able to perform bed mobility with minA for trunk elevation. Patient requires modA for sit to stand from low surface with RW. Patient with increased dizziness with standing and requires seated rest break with exercises for resolution of symptoms. Educated patient on seated exercises to assist with increasing BP prior to standing. Patient reports this has been an ongoing issue for a year and he stands and waits for dizziness to subside before ambulating at home. No resolution of symptoms in standing currently. Recommend SNF following discharge to maximize functional mobility and safety prior to returning home.   Orthostatic BPs  Supine 165/72 HR 70  Sitting 131/59 HR 85  Sitting after 3 min 147/87 HR 78  Standing 123/94 HR 105  Sitting + seated exercises 159/84 HR 91  Standing  112/59 HR 108      Follow Up Recommendations  Supervision/Assistance - 24 hour;SNF     Equipment Recommendations  None recommended by PT    Recommendations for Other Services       Precautions / Restrictions Precautions Precautions: Fall;Other (comment) Precaution Comments: Watch BP - orthostatic Restrictions Weight Bearing Restrictions: No    Mobility  Bed Mobility Overal bed mobility: Needs Assistance Bed Mobility: Supine to Sit;Sit to Supine     Supine to sit: Min assist Sit to supine: Min assist   General bed mobility comments: minA for trunk elevation to sitting EOB. MinA in supine for repositioning of trunk    Transfers Overall transfer level: Needs  assistance Equipment used: Rolling Bexton Haak (2 wheeled) Transfers: Sit to/from Stand Sit to Stand: Mod assist         General transfer comment: modA for boost up to standing. Orthostatics positive, returned to sitting for resolution of symptoms to attempt standing again.  Ambulation/Gait Ambulation/Gait assistance: Mod assist Gait Distance (Feet): 2 Feet Assistive device: Rolling Ronie Barnhart (2 wheeled) Gait Pattern/deviations: Step-to pattern     General Gait Details: Side steps to EOB. ModA to maintain balance   Stairs             Wheelchair Mobility    Modified Rankin (Stroke Patients Only) Modified Rankin (Stroke Patients Only) Pre-Morbid Rankin Score: No symptoms Modified Rankin: Moderately severe disability     Balance Overall balance assessment: Needs assistance Sitting-balance support: Feet supported;No upper extremity supported Sitting balance-Leahy Scale: Fair     Standing balance support: Bilateral upper extremity supported;During functional activity Standing balance-Leahy Scale: Poor Standing balance comment: reliant on BUE and moderate external support                            Cognition Arousal/Alertness: Awake/alert Behavior During Therapy: WFL for tasks assessed/performed Overall Cognitive Status: Difficult to assess                                        Exercises General Exercises - Lower Extremity Ankle Circles/Pumps: Both;10 reps;AROM;Seated;Supine Long Arc Quad: AROM;Both;10 reps;Seated Hip Flexion/Marching: AROM;Both;20  reps;Seated    General Comments General comments (skin integrity, edema, etc.): see note for orthostatic vitals      Pertinent Vitals/Pain Pain Assessment: No/denies pain    Home Living                      Prior Function            PT Goals (current goals can now be found in the care plan section) Acute Rehab PT Goals Patient Stated Goal: to feel better PT Goal  Formulation: With patient Time For Goal Achievement: 09/14/20 Potential to Achieve Goals: Good Progress towards PT goals: Not progressing toward goals - comment (limited by orthostasis)    Frequency    Min 3X/week      PT Plan Current plan remains appropriate    Co-evaluation              AM-PAC PT "6 Clicks" Mobility   Outcome Measure  Help needed turning from your back to your side while in a flat bed without using bedrails?: A Little Help needed moving from lying on your back to sitting on the side of a flat bed without using bedrails?: A Little Help needed moving to and from a bed to a chair (including a wheelchair)?: A Little Help needed standing up from a chair using your arms (e.g., wheelchair or bedside chair)?: A Lot Help needed to walk in hospital room?: A Lot Help needed climbing 3-5 steps with a railing? : A Lot 6 Click Score: 15    End of Session Equipment Utilized During Treatment: Gait belt Activity Tolerance: Treatment limited secondary to medical complications (Comment) (+ orthostatics) Patient left: in bed;with call bell/phone within reach;with nursing/sitter in room Nurse Communication: Mobility status PT Visit Diagnosis: Unsteadiness on feet (R26.81);Muscle weakness (generalized) (M62.81)     Time: 9357-0177 PT Time Calculation (min) (ACUTE ONLY): 26 min  Charges:  $Therapeutic Activity: 23-37 mins                     Winola Drum A. Dan Humphreys PT, DPT Acute Rehabilitation Services Pager [phone] Office [phone]    Viviann Spare 09/03/2020, 11:25 AM

## 2020-09-03 NOTE — Progress Notes (Signed)
PROGRESS NOTE    Thomas [name]  [id number] DOB: 12/22/1928 DOA: 08/31/2020 PCP: Ardith DarkParker, Caleb M, MD   Brief Narrative:  This 87 years old male with PMH significant for osteoarthritis at multiple sites, history of DVT, CAD/CABG x 6, history of angioplasty with stent placement, type 2 diabetes, hyperlipidemia, hypertension, hypothyroidism, chronic kidney disease, hearing impairment, macular degeneration, history of nonhemorrhagic stroke brought in the ED with right-sided weakness along with right sided facial droop and aphasia.  Patient was found to be hypotensive after EMS arrived.  Patient was brought in the ED as stroke code,  his symptoms has improved by the time patient arrived in the ED.  CT head showed evolving acute to early subacute right PCA distribution infarct.   Assessment & Plan:   Principal Problem:   TIA (transient ischemic attack) Active Problems:   Coronary artery disease s/p CABG 2001   Hypothyroidism   Venous thromboembolism (VTE)   Hypertension   Type 2 diabetes mellitus with hyperglycemia ([name])   Dyslipidemia due to type 2 diabetes mellitus ([name])   BPH (benign prostatic hyperplasia)   Normocytic anemia   Prolonged QT interval   Acute CVA (cerebrovascular accident) ([name])   Aphasia/ right facial droop secondary to right occipital stroke: Patient presented with right-sided weakness, right facial droop and aphasia. Symptoms resolved by the time patient arrived in the ED. CT head showed evolving acute to early subacute right PCA distribution infarct. Continue telemetry. Frequent neurochecks. PT/OT > skilled nursing facility. Hemoglobin A1c 7.5 Lipid profile shows LDL 37. Echocardiogram showed EF 60 to 65%.  No embolic source. Continue aspirin and warfarin. Patient was on warfarin before admission. MRI: Small to moderate size acute infarction in the inferior right occipital lobe. No hemorrhage. Neuro hospitalist team is following.  Patient had a stroke  despite being on warfarin. Neurology has recommended switching to Eliquis. Discussion about Eliquis has to be done by outpatient cardiologist and oncologist. Cardiologist recommended to continue Coumadin. Oncologist recommended to switch to Eliquis.  Coronary artery disease s/p CABG 2001 Continue carvedilol, aspirin and statin.  Hypothyroidism Continue levothyroxine 100 mcg p.o. daily.  Venous thromboembolism (VTE) [facility] Continue warfarin per pharmacy.  Hypertension Held meds, resume his blood pressure improves Wife report patient has low blood pressure after he stands up. Will check orthostatic hypotension.  Type 2 diabetes mellitus with hyperglycemia ([name]) Carbohydrate modified diet. Continue Lantus 13 units SQ daily. CBG monitoring with RI SS. Hemoglobin A1c 7.4  Dyslipidemia due to type 2 diabetes mellitus ([name])  Continue rosuvastatin 20 mg p.o. daily.  BPH (benign prostatic hyperplasia) Continue tamsulosin 0.4 mg p.o. daily.  Normocytic anemia Monitor H&H.  Urine culture positive for Pseudomonas: Patient started on Keflex at admission. UA not significant. Urine culture grew 100,000 colonies of Pseudomonas. Started cefepime on 3/7.  DVT prophylaxis: Coumadin  code Status: Partial Family Communication: Wife and son at bedside discussed in detail. Disposition Plan:    Status is: Inpatient  Remains inpatient appropriate because:Inpatient level of care appropriate due to severity of illness   Dispo: The patient is from: Home              Anticipated d/c is to: SNF              Patient currently is not medically stable to d/c.   Difficult to place patient No   Consultants:   Neurology  Procedures: Echocardiogram, MRI. Antimicrobials:   Anti-infectives (From admission, onward)   Start  Dose/Rate Route Frequency Ordered Stop   09/01/20 1700  ceFEPIme (MAXIPIME) 2 g in sodium chloride 0.9 % 100 mL IVPB        2  g 200 mL/hr over 30 Minutes Intravenous Every 24 hours 09/01/20 1618     08/31/20 1000  cephALEXin (KEFLEX) capsule 500 mg  Status:  Discontinued        500 mg Oral Every 12 hours 08/31/20 0741 09/01/20 1618      Subjective: Patient was seen and examined at bedside. No overnight events,  He reports feeling improved,  awake and alert oriented x 2.  He reports dizziness while getting out of bed.    Objective: Vitals:   09/02/20 2340 09/03/20 0338 09/03/20 0827 09/03/20 1116  BP: 138/65 127/81 140/64 (!) 165/76  Pulse: 69 79 70 70  Resp: 18 18 18 18   Temp: 98.3 F (36.8 C) 98.8 F (37.1 C) 98.3 F (36.8 C) 97.8 F (36.6 C)  TempSrc: Oral Oral Oral Oral  SpO2: 93% 99%  100%  Weight:      Height:        Intake/Output Summary (Last 24 hours) at 09/03/2020 1422 Last data filed at 09/03/2020 1300 Gross per 24 hour  Intake 900 ml  Output 2750 ml  Net -1850 ml   Filed Weights   08/31/20 0000  Weight: 77.7 kg    Examination:  General exam: Appears calm and comfortable, not in any acute distress. Respiratory system: Clear to auscultation. Respiratory effort normal. Cardiovascular system: S1 & S2 heard, RRR. No JVD, murmurs, rubs, gallops or clicks. No pedal edema. Gastrointestinal system: Abdomen is nondistended, soft and nontender. No organomegaly or masses felt. Normal bowel sounds heard. Central nervous system: Alert and oriented x2.  4/5 grip strength right arm, 5/5 in other extremities. Extremities: No edema, no cyanosis, no clubbing. Skin: No rashes, lesions or ulcers Psychiatry: Judgement and insight appear normal. Mood & affect appropriate.     Data Reviewed: I have personally reviewed following labs and imaging studies  CBC: Recent Labs  Lab 08/27/20 1526 08/31/20 0059 08/31/20 0101 09/01/20 0337 09/02/20 0405  WBC 10.8* 10.7*  --  10.1 10.3  NEUTROABS 5.8 6.4  --   --   --   HGB 11.1* 11.0* 10.5* 10.4* 10.6*  HCT 33.6* 33.4* 31.0* 30.7* 32.2*  MCV 97.1  98.8  --  96.8 99.4  PLT 213 194  --  184 180   Basic Metabolic Panel: Recent Labs  Lab 08/27/20 1526 08/31/20 0059 08/31/20 0101 08/31/20 0523 09/01/20 0337 09/02/20 0405  NA 136 135 138  --  136 136  K 4.7 4.4 4.4  --  4.8 4.5  CL 108 104 103  --  106 105  CO2 20* 21*  --   --  22 21*  GLUCOSE 149* 214* 208*  --  180* 198*  BUN 23 30* 31*  --  26* 24*  CREATININE 1.16 1.37* 1.40*  --  1.21 1.13  CALCIUM 8.8* 8.9  --   --  9.0 9.3  MG  --   --   --  1.7 2.0 1.7  PHOS  --   --   --   --   --  3.1   GFR: Estimated Creatinine Clearance: 42.9 mL/min (by C-G formula based on SCr of 1.13 mg/dL). Liver Function Tests: Recent Labs  Lab 08/27/20 1526 08/31/20 0059  AST 25 29  ALT 29 36  ALKPHOS 56 41  BILITOT 0.2* 0.5  PROT 7.6 6.5  ALBUMIN 3.8 3.3*   No results for input(s): LIPASE, AMYLASE in the last 168 hours. No results for input(s): AMMONIA in the last 168 hours. Coagulation Profile: Recent Labs  Lab 08/31/20 0059 09/01/20 0337 09/02/20 0405 09/03/20 0422  INR 2.6* 3.3* 2.7* 2.0*   Cardiac Enzymes: No results for input(s): CKTOTAL, CKMB, CKMBINDEX, TROPONINI in the last 168 hours. BNP (last 3 results) No results for input(s): PROBNP in the last 8760 hours. HbA1C: No results for input(s): HGBA1C in the last 72 hours. CBG: Recent Labs  Lab 08/31/20 0054 08/31/20 1815 09/03/20 0029  GLUCAP 199* 156* 174*   Lipid Profile: No results for input(s): CHOL, HDL, LDLCALC, TRIG, CHOLHDL, LDLDIRECT in the last 72 hours. Thyroid Function Tests: No results for input(s): TSH, T4TOTAL, FREET4, T3FREE, THYROIDAB in the last 72 hours. Anemia Panel: No results for input(s): VITAMINB12, FOLATE, FERRITIN, TIBC, IRON, RETICCTPCT in the last 72 hours. Sepsis Labs: No results for input(s): PROCALCITON, LATICACIDVEN in the last 168 hours.  Recent Results (from the past 240 hour(s))  SARS CORONAVIRUS 2 (TAT 6-24 HRS) Nasopharyngeal Nasopharyngeal Swab     Status: None    Collection Time: 08/31/20  2:47 AM   Specimen: Nasopharyngeal Swab  Result Value Ref Range Status   SARS Coronavirus 2 NEGATIVE NEGATIVE Final    Comment: (NOTE) SARS-CoV-2 target nucleic acids are NOT DETECTED.  The SARS-CoV-2 RNA is generally detectable in upper and lower respiratory specimens during the acute phase of infection. Negative results do not preclude SARS-CoV-2 infection, do not rule out co-infections with other pathogens, and should not be used as the sole basis for treatment or other patient management decisions. Negative results must be combined with clinical observations, patient history, and epidemiological information. The expected result is Negative.  Fact Sheet for Patients: HairSlick.no  Fact Sheet for Healthcare Providers: quierodirigir.com  This test is not yet approved or cleared by the [place] FDA and  has been authorized for detection and/or diagnosis of SARS-CoV-2 by FDA under an Emergency Use Authorization (EUA). This EUA will remain  in effect (meaning this test can be used) for the duration of the COVID-19 declaration under Se ction 564(b)(1) of the Act, 21 U.S.C. section 360bbb-3(b)(1), unless the authorization is terminated or revoked sooner.  Performed at [facility], [address] [place], [place] [address]   Culture, Urine     Status: Abnormal   Collection Time: 08/31/20  9:13 AM   Specimen: Urine, Random  Result Value Ref Range Status   Specimen Description URINE, RANDOM  Final   Special Requests NONE  Final   Culture >=100,000 COLONIES/mL PSEUDOMONAS AERUGINOSA (A)  Final   Report Status 09/02/2020 FINAL  Final   Organism ID, Bacteria PSEUDOMONAS AERUGINOSA (A)  Final      Susceptibility   Pseudomonas aeruginosa - MIC*    CEFTAZIDIME 2 SENSITIVE Sensitive     CIPROFLOXACIN <=0.25 SENSITIVE Sensitive     GENTAMICIN <=1 SENSITIVE Sensitive     IMIPENEM 1 SENSITIVE  Sensitive     PIP/TAZO <=4 SENSITIVE Sensitive     CEFEPIME 2 SENSITIVE Sensitive     * >=100,000 COLONIES/mL PSEUDOMONAS AERUGINOSA  SARS CORONAVIRUS 2 (TAT 6-24 HRS) Nasopharyngeal Nasopharyngeal Swab     Status: None   Collection Time: 09/02/20  2:19 PM   Specimen: Nasopharyngeal Swab  Result Value Ref Range Status   SARS Coronavirus 2 NEGATIVE NEGATIVE Final    Comment: (NOTE) SARS-CoV-2 target nucleic acids are NOT  DETECTED.  The SARS-CoV-2 RNA is generally detectable in upper and lower respiratory specimens during the acute phase of infection. Negative results do not preclude SARS-CoV-2 infection, do not rule out co-infections with other pathogens, and should not be used as the sole basis for treatment or other patient management decisions. Negative results must be combined with clinical observations, patient history, and epidemiological information. The expected result is Negative.  Fact Sheet for Patients: HairSlick.no  Fact Sheet for Healthcare Providers: quierodirigir.com  This test is not yet approved or cleared by the [place] FDA and  has been authorized for detection and/or diagnosis of SARS-CoV-2 by FDA under an Emergency Use Authorization (EUA). This EUA will remain  in effect (meaning this test can be used) for the duration of the COVID-19 declaration under Se ction 564(b)(1) of the Act, 21 U.S.C. section 360bbb-3(b)(1), unless the authorization is terminated or revoked sooner.  Performed at [facility], [address], [place], [place] [address]      Radiology Studies: No results found. Scheduled Meds: . aspirin  81 mg Oral Daily  . cholecalciferol  1,000 Units Oral Daily  . feeding supplement  237 mL Oral BID BM  . insulin glargine  13 Units Subcutaneous QHS  . levothyroxine  100 mcg Oral Daily  . mirtazapine  7.5 mg Oral QHS  . multivitamin with minerals  1 tablet Oral Daily  .  rosuvastatin  20 mg Oral QHS  . tamsulosin  0.4 mg Oral QPC supper  . warfarin  4 mg Oral ONCE-1600  . Warfarin - Pharmacist Dosing Inpatient   Does not apply q1600   Continuous Infusions: . ceFEPime (MAXIPIME) IV 2 g (09/02/20 1633)     LOS: 3 days    Time spent: 25 mins    Kairo Laubacher, MD [facility]   If 7PM-7AM, please contact night-coverage

## 2020-09-03 NOTE — Progress Notes (Signed)
ANTICOAGULATION CONSULT NOTE - Follow-Up Consult  Pharmacy Consult for Warfarin Indication: Hx DVT, CVA   Patient Measurements: Height: 5\' 8"  (172.7 cm) Weight: 77.7 kg (171 lb 4.8 oz) IBW/kg (Calculated) : 68.4   Vital Signs: Temp: 98.8 F (37.1 C) (03/09 0338) Temp Source: Oral (03/09 0338) BP: 127/81 (03/09 0338) Pulse Rate: 79 (03/09 0338)  Labs: Recent Labs    09/01/20 0337 09/02/20 0405 09/03/20 0422  HGB 10.4* 10.6*  --   HCT 30.7* 32.2*  --   PLT 184 180  --   LABPROT 32.7* 27.7* 21.7*  INR 3.3* 2.7* 2.0*  CREATININE 1.21 1.13  --     Estimated Creatinine Clearance: 42.9 mL/min (by C-G formula based on SCr of 1.13 mg/dL).   Assessment: 89 YOM on warfarin PTA for hx DVT who presented on 3/6 with new CVA despite therapeutic INR. Decision to continue warfarin (cards) vs switch to DOAC (neuro + onc) being discussed with the patient's care team. Pharmacy consulted to continue with warfarin dosing at this time.  INR today is therapeutic but dropped (INR 2 << 2.7, goal of 2-3). Drop likely related to dose held on 3/7. CBC has been stable - no bleeding noted.   PTA dose of coumadin 3mg  po q T,Th,Sat and 4mg  MWFSu.    Goal of Therapy:  INR 2-3 Monitor platelets by anticoagulation protocol: Yes   Plan:  - Warfarin 4 mg x 1 dose at 1600 today - Daily PT/INR, CBC q72h - Will continue to monitor for any signs/symptoms of bleeding and will follow up with PT/INR in the a.m.    Thank you for allowing pharmacy to be a part of this patient's care.  5/7, PharmD, BCPS Clinical Pharmacist Clinical phone for 09/03/2020: 09/03/2020 8:18 AM   **Pharmacist phone directory can now be found on [email or web address] ([email or web address]).  Listed under [facility].

## 2020-09-03 NOTE — Care Management Important Message (Signed)
Important Message  Patient Details  Name: Thomas [name] MRN: [id number] Date of Birth: January 24, 1929   Medicare Important Message Given:  Yes     Jemari Hallum Stefan Church 09/03/2020, 2:54 PM

## 2020-09-04 LAB — PROTIME-INR
INR: 1.8 — ABNORMAL HIGH (ref 0.8–1.2)
Prothrombin Time: 19.8 seconds — ABNORMAL HIGH (ref 11.4–15.2)

## 2020-09-04 MED ORDER — FLUDROCORTISONE ACETATE 0.1 MG PO TABS
0.0500 mg | ORAL_TABLET | Freq: Every day | ORAL | Status: DC
  Administered 2020-09-04 – 2020-09-08 (×5): 0.05 mg via ORAL
  Filled 2020-09-04 (×5): qty 0.5

## 2020-09-04 MED ORDER — FLUDROCORTISONE ACETATE 0.1 MG PO TABS
0.1000 mg | ORAL_TABLET | Freq: Every day | ORAL | Status: DC
  Filled 2020-09-04: qty 1

## 2020-09-04 MED ORDER — WARFARIN SODIUM 4 MG PO TABS
4.0000 mg | ORAL_TABLET | Freq: Once | ORAL | Status: AC
  Administered 2020-09-04: 4 mg via ORAL
  Filled 2020-09-04 (×2): qty 1

## 2020-09-04 NOTE — Progress Notes (Signed)
ANTICOAGULATION CONSULT NOTE - Follow-Up Consult  Pharmacy Consult for Warfarin Indication: Hx DVT, CVA   Patient Measurements: Height: 5\' 8"  (172.7 cm) Weight: 77.7 kg (171 lb 4.8 oz) IBW/kg (Calculated) : 68.4   Vital Signs: Temp: 98.2 F (36.8 C) (03/10 0336) Temp Source: Oral (03/10 0336) BP: 164/57 (03/10 0336) Pulse Rate: 70 (03/10 0336)  Labs: Recent Labs    09/02/20 0405 09/03/20 0422 09/04/20 0422  HGB 10.6*  --   --   HCT 32.2*  --   --   PLT 180  --   --   LABPROT 27.7* 21.7* 19.8*  INR 2.7* 2.0* 1.8*  CREATININE 1.13  --   --     Estimated Creatinine Clearance: 42.9 mL/min (by C-G formula based on SCr of 1.13 mg/dL).   Assessment: 89 YOM on warfarin PTA for hx DVT who presented on 3/6 with new CVA despite therapeutic INR. Decision to continue warfarin (cards) vs switch to DOAC (neuro + onc) being discussed with the patient's care team. Pharmacy consulted to continue with warfarin dosing at this time.  INR today is slightly SUBtherapeutic (INR 1.8 << 2, goal of 2-3). Drop likely still related to dose held on 3/7. Would expect to be back into goal range tomorrow. CBC has been stable - no bleeding noted.   PTA dose of coumadin 3mg  po q T,Th,Sat and 4mg  MWFSu.   Goal of Therapy:  INR 2-3 Monitor platelets by anticoagulation protocol: Yes   Plan:  - Warfarin 4 mg x 1 dose at 1600 today - Daily PT/INR, CBC q72h - Will continue to monitor for any signs/symptoms of bleeding and will follow up with PT/INR in the a.m.    Thank you for allowing pharmacy to be a part of this patient's care.  5/7, PharmD, BCPS Clinical Pharmacist Clinical phone for 09/04/2020: 09/04/2020 8:41 AM   **Pharmacist phone directory can now be found on [email or web address] ([email or web address]).  Listed under [facility].

## 2020-09-04 NOTE — TOC Progression Note (Signed)
Transition of Care [facility]) - Progression Note    Patient Details  Name: Thomas [name] MRN: [id number] Date of Birth: [birth year]  Transition of Care [facility]) CM/SW Contact  Kermit Balo, RN Phone Number: 09/04/2020, 10:45 AM  Clinical Narrative:    [facility] doesn't feel the patient is medically ready for SNF rehab. MD to work on orthostatic BP's.  Facility updated. Family aware.  TOC following.   Expected Discharge Plan: Skilled Nursing Facility Barriers to Discharge: Continued Medical Work up  Expected Discharge Plan and Services Expected Discharge Plan: Skilled Nursing Facility In-house Referral: Clinical Social Work Discharge Planning Services: CM Consult Post Acute Care Choice: Skilled Nursing Facility Living arrangements for the past 2 months: Single Family Home                                       Social Determinants of Health (SDOH) Interventions    Readmission Risk Interventions No flowsheet data found.

## 2020-09-04 NOTE — Progress Notes (Signed)
Pharmacy Antibiotic Note  Thomas [name] is a 87 y.o. male admitted on 08/31/2020 with TIA and found to have a UTI growing Pseudomonas.  Pharmacy has been consulted for Cefepime dosing.  Today is D#4 of antibiotics to cover the Psuedomonas UTI. It is unclear if the patient was symptomatic or not - consider a 5-7d stop date. Current dosing remains appropriate.   Plan: - Continue Cefepime 2g IV every 24 hours - Will continue to follow renal function, culture results, LOT, and antibiotic de-escalation plans   Height: 5\' 8"  (172.7 cm) Weight: 77.7 kg (171 lb 4.8 oz) IBW/kg (Calculated) : 68.4  Temp (24hrs), Avg:98.2 F (36.8 C), Min:97.8 F (36.6 C), Max:98.6 F (37 C)  Recent Labs  Lab 08/31/20 0059 08/31/20 0101 09/01/20 0337 09/02/20 0405  WBC 10.7*  --  10.1 10.3  CREATININE 1.37* 1.40* 1.21 1.13    Estimated Creatinine Clearance: 42.9 mL/min (by C-G formula based on SCr of 1.13 mg/dL).    Allergies  Allergen Reactions  . Amitriptyline   . Effexor Xr [Venlafaxine]   . Lyrica [Pregabalin]   . Morphine   . Morphine And Related   . Pravachol [Pravastatin]   . Zolpidem     Antimicrobials this admission: Cephalexin 3/6>>3/7 Cefepime 3/7 >>  Microbiology results: 3/6 urine >> 100k PSA (pan-S) 3/6 COVID: negative  Thank you for allowing pharmacy to be a part of this patient's care.  5/7, PharmD, BCPS Clinical Pharmacist Clinical phone for 09/04/2020: [phone] 09/04/2020 9:09 AM   **Pharmacist phone directory can now be found on [email or web address] ([email or web address]).  Listed under [facility].

## 2020-09-04 NOTE — Progress Notes (Addendum)
Occupational Therapy Treatment Patient Details Name: Thomas [name] MRN: [id number] DOB: [birth year] Today's Date: 09/04/2020    History of present illness Pt is an 87 y/o male admitted 3/6 secondary to worsening R sided weakness. Workup pending, but CT showed acute to early subacute right PCA distribution. PMH includes CVA, CAD s/p CABG, HTN, macular degeneration, VTE, and DM.   OT comments  Patient standing at EOB upon entry, nursing assessing orthostatic vitals.  Patient stable in sitting and denies symptoms, therefore completing grooming tasks seated in recliner at sink--setup for tasks, min guard for balance due to 1 LOB to R reaching dynamically.  Patient transferred to/from recliner with mod assist to stand and min assist to pivot using RW.  Patient requests to return to bed upon exit, min guard bed mobility for safety.  Encouraged HOB elevation in bed.  BP post session 155/73. Continue to recommend SNF.    Follow Up Recommendations  SNF;Supervision/Assistance - 24 hour    Equipment Recommendations  Other (comment) (TBD at next venue of care)    Recommendations for Other Services      Precautions / Restrictions Precautions Precautions: Fall;Other (comment) Precaution Comments: Watch BP - orthostatic Restrictions Weight Bearing Restrictions: No       Mobility Bed Mobility Overal bed mobility: Needs Assistance Bed Mobility: Sit to Supine       Sit to supine: Min guard   General bed mobility comments: standing at EOB with nursing upon entry, returned to supine with min guard for safety    Transfers Overall transfer level: Needs assistance Equipment used: Rolling walker (2 wheeled) Transfers: Sit to/from [employer] Sit to Stand: Mod assist Stand pivot transfers: Min assist       General transfer comment: mod assist to power up and steady with cueing for hand placement and safety; pivots to recliner with min assist for safety/balance    Balance  Overall balance assessment: Needs assistance Sitting-balance support: Feet supported;No upper extremity supported Sitting balance-Leahy Scale: Fair     Standing balance support: Bilateral upper extremity supported;During functional activity Standing balance-Leahy Scale: Poor Standing balance comment: reliant on BUE and moderate external support                           ADL either performed or assessed with clinical judgement   ADL Overall ADL's : Needs assistance/impaired     Grooming: Wash/dry hands;Wash/dry face;Oral care;Set up;Sitting Grooming Details (indicate cue type and reason): min guard at times with R lateral lean sitting at sink                 Toilet Transfer: Moderate assistance;Stand-pivot;RW Toilet Transfer Details (indicate cue type and reason): simulated to recliner         Functional mobility during ADLs: Moderate assistance;Rolling walker;Cueing for safety General ADL Comments: limited by orthostatic hypotension, able to tolerate sitting tasks but not standing without symptoms     Vision       Perception     Praxis      Cognition Arousal/Alertness: Awake/alert Behavior During Therapy: WFL for tasks assessed/performed Overall Cognitive Status: Impaired/Different from baseline Area of Impairment: Problem solving                             Problem Solving: Slow processing;Requires verbal cues General Comments: requires increased time to process and problem solve        Exercises  Shoulder Instructions       General Comments nursing assessed orthostatics upon entry, BP post session 155/73 supine, all other VSS (session in sitting due to dropping in standing)    Pertinent Vitals/ Pain       Pain Assessment: No/denies pain  Home Living                                          Prior Functioning/Environment              Frequency  Min 2X/week        Progress Toward Goals  OT  Goals(current goals can now be found in the care plan section)  Progress towards OT goals: Progressing toward goals  Acute Rehab OT Goals Patient Stated Goal: to feel better OT Goal Formulation: With patient  Plan Discharge plan remains appropriate;Frequency remains appropriate    Co-evaluation                 AM-PAC OT "6 Clicks" Daily Activity     Outcome Measure   Help from another person eating meals?: A Little Help from another person taking care of personal grooming?: A Little Help from another person toileting, which includes using toliet, bedpan, or urinal?: A Lot Help from another person bathing (including washing, rinsing, drying)?: A Little Help from another person to put on and taking off regular upper body clothing?: A Little Help from another person to put on and taking off regular lower body clothing?: A Lot 6 Click Score: 16    End of Session Equipment Utilized During Treatment: Gait belt;Rolling walker  OT Visit Diagnosis: Unsteadiness on feet (R26.81)   Activity Tolerance Patient tolerated treatment well   Patient Left in bed;with call bell/phone within reach;with bed alarm set   Nurse Communication Mobility status        Time: 7673-4193 OT Time Calculation (min): 28 min  Charges: OT General Charges $OT Visit: 1 Visit OT Treatments $Self Care/Home Management : 23-37 mins  Barry Brunner, OT Acute Rehabilitation Services Pager [phone] Office [phone]     Chancy Milroy 09/04/2020, 11:29 AM

## 2020-09-04 NOTE — Progress Notes (Signed)
  Speech Language Pathology Treatment: Cognitive-Linquistic  Patient Details Name: Thomas [name] MRN: [id number] DOB: 05-Jul-1928 Today's Date: 09/04/2020 Time: 1205-1220 SLP Time Calculation (min) (ACUTE ONLY): 15 min  Assessment / Plan / Recommendation Clinical Impression  SLP followed up for cognitive intervention. Pts spouse and son at bedside. Pt consuming lunch meal, exhibited some overt coughing with POs (following repositioning fully upright pt appeared to have improved tolerance) will monitor for need for dysphagia intervention. Pt exhibited decreased insight and reduced problem solving during lunch meal, attempting to consume more POs with prolonged coughing. Per spouse and family report, pt has exhibited change in mental status but is getting closer to baseline. Reinforced environmental visual aides for improved temporal orientation and improved recall of novel information. Will follow during acute stay to maximize cognitive recovery.      HPI HPI: Pt is an 87 y/o male admitted 3/6 secondary to worsening R sided weakness. MRI of brain shows acute right occipital infarction. PMH includes CVA, CAD s/p CABG, HTN, macular degeneration, VTE, and DM.      SLP Plan  Continue with current plan of care       Recommendations                   Follow up Recommendations: Skilled Nursing facility SLP Visit Diagnosis: Cognitive communication deficit (Q33.007) Plan: Continue with current plan of care       GO                Frida Wahlstrom H. MA, CCC-SLP Acute Rehabilitation Services   09/04/2020, 1:20 PM

## 2020-09-04 NOTE — Progress Notes (Signed)
PROGRESS NOTE    Thomas [name]  QHU:[id number] DOB: [birth year] DOA: 08/31/2020 PCP: Ardith Dark, MD   Brief Narrative:  This 87 years old male with PMH significant for osteoarthritis at multiple sites, history of DVT, CAD/CABG x 6, history of angioplasty with stent placement, type 2 diabetes, hyperlipidemia, hypertension, hypothyroidism, chronic kidney disease, hearing impairment, macular degeneration, history of nonhemorrhagic stroke brought in the ED with right-sided weakness along with right sided facial droop and aphasia.  Patient was found to be hypotensive after EMS arrived.  Patient was brought in the ED as stroke code,  his symptoms has improved by the time patient arrived in the ED.  CT head showed evolving acute to early subacute right PCA distribution infarct.   Assessment & Plan:   Principal Problem:   TIA (transient ischemic attack) Active Problems:   Coronary artery disease s/p CABG 2001   Hypothyroidism   Venous thromboembolism (VTE)   Hypertension   Type 2 diabetes mellitus with hyperglycemia ([name])   Dyslipidemia due to type 2 diabetes mellitus ([name])   BPH (benign prostatic hyperplasia)   Normocytic anemia   Prolonged QT interval   Acute CVA (cerebrovascular accident) ([name])   Aphasia/ right facial droop secondary to right occipital stroke: Patient presented with right-sided weakness, right facial droop and aphasia. Symptoms resolved by the time patient arrived in the ED. CT head showed evolving acute to early subacute right PCA distribution infarct. Continue telemetry. Frequent neurochecks. PT/OT > skilled nursing facility. Hemoglobin A1c 7.5 Lipid profile shows LDL 37. Echocardiogram showed EF 60 to 65%.  No embolic source. Continue aspirin and warfarin. Patient was on warfarin before admission. MRI: Small to moderate size acute infarction in the inferior right occipital lobe. No hemorrhage. Neuro hospitalist team is following.  Patient had a stroke  despite being on warfarin. Neurology has recommended switching to Eliquis. Discussion about Eliquis has to be done by outpatient cardiologist and oncologist. Cardiologist recommended to continue Coumadin. Oncologist recommended to switch to Eliquis.  Coronary artery disease s/p CABG 2001 Continue carvedilol, aspirin and statin.  Hypothyroidism Continue levothyroxine 100 mcg p.o. daily.  Venous thromboembolism (VTE) No need to hold [facility] per neurology. Continue warfarin per pharmacy.  Hypertension Held meds, resume his blood pressure improves Wife report patient has low blood pressure after he stands up.  Orthostatic hypotension: Patient has significant orthostasis after he stands up. Patient reports having this issue for more than a year. Start fludrocortisone 0.05 mg daily.  Type 2 diabetes mellitus with hyperglycemia ([name]) Carbohydrate modified diet. Continue Lantus 13 units SQ daily. CBG monitoring with RI SS. Hemoglobin A1c 7.4  Dyslipidemia due to type 2 diabetes mellitus ([name])  Continue rosuvastatin 20 mg p.o. daily.  BPH (benign prostatic hyperplasia) Continue tamsulosin 0.4 mg p.o. daily.  Normocytic anemia Monitor H&H.  Urine culture positive for Pseudomonas: Patient started on Keflex at admission. UA not significant. Urine culture grew 100,000 colonies of Pseudomonas. Continue cefepime for 5 days, last date 3/11.  DVT prophylaxis: Coumadin  code Status: Partial Family Communication: Wife and son at bedside discussed in detail. Disposition Plan:    Status is: Inpatient  Remains inpatient appropriate because:Inpatient level of care appropriate due to severity of illness   Dispo: The patient is from: Home              Anticipated d/c is to: SNF              Patient currently is not medically stable to d/c.   Difficult  to place patient No   Consultants:   Neurology  Procedures: Echocardiogram, MRI. Antimicrobials:   Anti-infectives (From  admission, onward)   Start     Dose/Rate Route Frequency Ordered Stop   09/01/20 1700  ceFEPIme (MAXIPIME) 2 g in sodium chloride 0.9 % 100 mL IVPB        2 g 200 mL/hr over 30 Minutes Intravenous Every 24 hours 09/01/20 1618     08/31/20 1000  cephALEXin (KEFLEX) capsule 500 mg  Status:  Discontinued        500 mg Oral Every 12 hours 08/31/20 0741 09/01/20 1618      Subjective: Patient was seen and examined at bedside. No overnight events,   He reports feeling improved,  awake and alert oriented x 2.  He reports dizziness while getting out of bed.  Patient asks when he can be discharged to nursing home.   Objective: Vitals:   09/03/20 1941 09/03/20 2355 09/04/20 0336 09/04/20 1141  BP: (!) 158/49 (!) 137/58 (!) 164/57 (!) 162/65  Pulse: 72 79 70 73  Resp: 18 18 18 17   Temp: 98.6 F (37 C) 98.4 F (36.9 C) 98.2 F (36.8 C) 98.4 F (36.9 C)  TempSrc: Oral Oral Oral Oral  SpO2: 98% 97% 98% 99%  Weight:      Height:        Intake/Output Summary (Last 24 hours) at 09/04/2020 1423 Last data filed at 09/04/2020 1143 Gross per 24 hour  Intake 2458.29 ml  Output 2400 ml  Net 58.29 ml   Filed Weights   08/31/20 0000  Weight: 77.7 kg    Examination:  General exam: Appears calm and comfortable, not in any acute distress. Respiratory system: Clear to auscultation. Respiratory effort normal. Cardiovascular system: S1 & S2 heard, RRR. No JVD, murmurs, rubs, gallops or clicks. No pedal edema. Gastrointestinal system: Abdomen is nondistended, soft and nontender. No organomegaly or masses felt. Normal bowel sounds heard. Central nervous system: Alert and oriented x2.  4/5 grip strength right arm, 5/5 in other extremities. Extremities: No edema, no cyanosis, no clubbing. Skin: No rashes, lesions or ulcers Psychiatry: Judgement and insight appear normal. Mood & affect appropriate.     Data Reviewed: I have personally reviewed following labs and imaging studies  CBC: Recent  Labs  Lab 08/31/20 0059 08/31/20 0101 09/01/20 0337 09/02/20 0405  WBC 10.7*  --  10.1 10.3  NEUTROABS 6.4  --   --   --   HGB 11.0* 10.5* 10.4* 10.6*  HCT 33.4* 31.0* 30.7* 32.2*  MCV 98.8  --  96.8 99.4  PLT 194  --  184 180   Basic Metabolic Panel: Recent Labs  Lab 08/31/20 0059 08/31/20 0101 08/31/20 0523 09/01/20 0337 09/02/20 0405  NA 135 138  --  136 136  K 4.4 4.4  --  4.8 4.5  CL 104 103  --  106 105  CO2 21*  --   --  22 21*  GLUCOSE 214* 208*  --  180* 198*  BUN 30* 31*  --  26* 24*  CREATININE 1.37* 1.40*  --  1.21 1.13  CALCIUM 8.9  --   --  9.0 9.3  MG  --   --  1.7 2.0 1.7  PHOS  --   --   --   --  3.1   GFR: Estimated Creatinine Clearance: 42.9 mL/min (by C-G formula based on SCr of 1.13 mg/dL). Liver Function Tests: Recent Labs  Lab 08/31/20 0059  AST 29  ALT 36  ALKPHOS 41  BILITOT 0.5  PROT 6.5  ALBUMIN 3.3*   No results for input(s): LIPASE, AMYLASE in the last 168 hours. No results for input(s): AMMONIA in the last 168 hours. Coagulation Profile: Recent Labs  Lab 08/31/20 0059 09/01/20 0337 09/02/20 0405 09/03/20 0422 09/04/20 0422  INR 2.6* 3.3* 2.7* 2.0* 1.8*   Cardiac Enzymes: No results for input(s): CKTOTAL, CKMB, CKMBINDEX, TROPONINI in the last 168 hours. BNP (last 3 results) No results for input(s): PROBNP in the last 8760 hours. HbA1C: No results for input(s): HGBA1C in the last 72 hours. CBG: Recent Labs  Lab 08/31/20 0054 08/31/20 1815 09/03/20 0029  GLUCAP 199* 156* 174*   Lipid Profile: No results for input(s): CHOL, HDL, LDLCALC, TRIG, CHOLHDL, LDLDIRECT in the last 72 hours. Thyroid Function Tests: No results for input(s): TSH, T4TOTAL, FREET4, T3FREE, THYROIDAB in the last 72 hours. Anemia Panel: No results for input(s): VITAMINB12, FOLATE, FERRITIN, TIBC, IRON, RETICCTPCT in the last 72 hours. Sepsis Labs: No results for input(s): PROCALCITON, LATICACIDVEN in the last 168 hours.  Recent Results (from  the past 240 hour(s))  SARS CORONAVIRUS 2 (TAT 6-24 HRS) Nasopharyngeal Nasopharyngeal Swab     Status: None   Collection Time: 08/31/20  2:47 AM   Specimen: Nasopharyngeal Swab  Result Value Ref Range Status   SARS Coronavirus 2 NEGATIVE NEGATIVE Final    Comment: (NOTE) SARS-CoV-2 target nucleic acids are NOT DETECTED.  The SARS-CoV-2 RNA is generally detectable in upper and lower respiratory specimens during the acute phase of infection. Negative results do not preclude SARS-CoV-2 infection, do not rule out co-infections with other pathogens, and should not be used as the sole basis for treatment or other patient management decisions. Negative results must be combined with clinical observations, patient history, and epidemiological information. The expected result is Negative.  Fact Sheet for Patients: HairSlick.no  Fact Sheet for Healthcare Providers: quierodirigir.com  This test is not yet approved or cleared by the [place] FDA and  has been authorized for detection and/or diagnosis of SARS-CoV-2 by FDA under an Emergency Use Authorization (EUA). This EUA will remain  in effect (meaning this test can be used) for the duration of the COVID-19 declaration under Se ction 564(b)(1) of the Act, 21 U.S.C. section 360bbb-3(b)(1), unless the authorization is terminated or revoked sooner.  Performed at [facility], [address], [place], [place] [address]   Culture, Urine     Status: Abnormal   Collection Time: 08/31/20  9:13 AM   Specimen: Urine, Random  Result Value Ref Range Status   Specimen Description URINE, RANDOM  Final   Special Requests NONE  Final   Culture >=100,000 COLONIES/mL PSEUDOMONAS AERUGINOSA (A)  Final   Report Status 09/02/2020 FINAL  Final   Organism ID, Bacteria PSEUDOMONAS AERUGINOSA (A)  Final      Susceptibility   Pseudomonas aeruginosa - MIC*    CEFTAZIDIME 2 SENSITIVE Sensitive      CIPROFLOXACIN <=0.25 SENSITIVE Sensitive     GENTAMICIN <=1 SENSITIVE Sensitive     IMIPENEM 1 SENSITIVE Sensitive     PIP/TAZO <=4 SENSITIVE Sensitive     CEFEPIME 2 SENSITIVE Sensitive     * >=100,000 COLONIES/mL PSEUDOMONAS AERUGINOSA  SARS CORONAVIRUS 2 (TAT 6-24 HRS) Nasopharyngeal Nasopharyngeal Swab     Status: None   Collection Time: 09/02/20  2:19 PM   Specimen: Nasopharyngeal Swab  Result Value Ref Range Status   SARS Coronavirus 2 NEGATIVE  NEGATIVE Final    Comment: (NOTE) SARS-CoV-2 target nucleic acids are NOT DETECTED.  The SARS-CoV-2 RNA is generally detectable in upper and lower respiratory specimens during the acute phase of infection. Negative results do not preclude SARS-CoV-2 infection, do not rule out co-infections with other pathogens, and should not be used as the sole basis for treatment or other patient management decisions. Negative results must be combined with clinical observations, patient history, and epidemiological information. The expected result is Negative.  Fact Sheet for Patients: HairSlick.nohttps://www.fda.gov/media/138098/download  Fact Sheet for Healthcare Providers: quierodirigir.comhttps://www.fda.gov/media/138095/download  This test is not yet approved or cleared by the [place] FDA and  has been authorized for detection and/or diagnosis of SARS-CoV-2 by FDA under an Emergency Use Authorization (EUA). This EUA will remain  in effect (meaning this test can be used) for the duration of the COVID-19 declaration under Se ction 564(b)(1) of the Act, 21 U.S.C. section 360bbb-3(b)(1), unless the authorization is terminated or revoked sooner.  Performed at [facility], [address] [place], [place] [address]      Radiology Studies: No results found. Scheduled Meds: . aspirin  81 mg Oral Daily  . cholecalciferol  1,000 Units Oral Daily  . feeding supplement  237 mL Oral BID BM  . fludrocortisone  0.1 mg Oral Daily  . insulin glargine  13 Units  Subcutaneous QHS  . levothyroxine  100 mcg Oral Daily  . mirtazapine  7.5 mg Oral QHS  . multivitamin with minerals  1 tablet Oral Daily  . rosuvastatin  20 mg Oral QHS  . tamsulosin  0.4 mg Oral QPC supper  . warfarin  4 mg Oral ONCE-1600  . Warfarin - Pharmacist Dosing Inpatient   Does not apply q1600   Continuous Infusions: . sodium chloride 75 mL/hr at 09/04/20 0739  . ceFEPime (MAXIPIME) IV Stopped (09/03/20 1751)     LOS: 4 days    Time spent: 25 mins    Meelah Tallo, MD [facility]   If 7PM-7AM, please contact night-coverage

## 2020-09-05 ENCOUNTER — Encounter: Payer: Self-pay | Admitting: Family Medicine

## 2020-09-05 LAB — MAGNESIUM: Magnesium: 1.6 mg/dL — ABNORMAL LOW (ref 1.7–2.4)

## 2020-09-05 LAB — CBC
HCT: 31.3 % — ABNORMAL LOW (ref 39.0–52.0)
Hemoglobin: 10.9 g/dL — ABNORMAL LOW (ref 13.0–17.0)
MCH: 33.5 pg (ref 26.0–34.0)
MCHC: 34.8 g/dL (ref 30.0–36.0)
MCV: 96.3 fL (ref 80.0–100.0)
Platelets: 179 10*3/uL (ref 150–400)
RBC: 3.25 MIL/uL — ABNORMAL LOW (ref 4.22–5.81)
RDW: 12.4 % (ref 11.5–15.5)
WBC: 11.4 10*3/uL — ABNORMAL HIGH (ref 4.0–10.5)
nRBC: 0 % (ref 0.0–0.2)

## 2020-09-05 LAB — BASIC METABOLIC PANEL
Anion gap: 7 (ref 5–15)
BUN: 25 mg/dL — ABNORMAL HIGH (ref 8–23)
CO2: 27 mmol/L (ref 22–32)
Calcium: 9.4 mg/dL (ref 8.9–10.3)
Chloride: 101 mmol/L (ref 98–111)
Creatinine, Ser: 1.18 mg/dL (ref 0.61–1.24)
GFR, Estimated: 59 mL/min — ABNORMAL LOW (ref >60)
Glucose, Bld: 183 mg/dL — ABNORMAL HIGH (ref 70–99)
Potassium: 4.3 mmol/L (ref 3.5–5.1)
Sodium: 135 mmol/L (ref 135–145)

## 2020-09-05 LAB — GLUCOSE, CAPILLARY: Glucose-Capillary: 267 mg/dL — ABNORMAL HIGH (ref 70–99)

## 2020-09-05 LAB — PROTIME-INR
INR: 2 — ABNORMAL HIGH (ref 0.8–1.2)
Prothrombin Time: 22 seconds — ABNORMAL HIGH (ref 11.4–15.2)

## 2020-09-05 LAB — PHOSPHORUS: Phosphorus: 3.6 mg/dL (ref 2.5–4.6)

## 2020-09-05 MED ORDER — MAGNESIUM SULFATE 2 GM/50ML IV SOLN
2.0000 g | Freq: Once | INTRAVENOUS | Status: AC
  Administered 2020-09-05: 2 g via INTRAVENOUS
  Filled 2020-09-05: qty 50

## 2020-09-05 MED ORDER — WARFARIN SODIUM 4 MG PO TABS
4.0000 mg | ORAL_TABLET | Freq: Once | ORAL | Status: AC
  Administered 2020-09-05: 4 mg via ORAL
  Filled 2020-09-05: qty 1

## 2020-09-05 NOTE — Progress Notes (Signed)
PROGRESS NOTE    Thomas [name]  JXB:[id number] DOB: 16-Jan-1929 DOA: 08/31/2020 PCP: Ardith DarkParker, Caleb M, MD   Brief Narrative:  This 87 years old male with PMH significant for osteoarthritis at multiple sites, history of DVT, CAD/CABG x 6, history of angioplasty with stent placement, type 2 diabetes, hyperlipidemia, hypertension, hypothyroidism, chronic kidney disease, hearing impairment, macular degeneration, history of nonhemorrhagic stroke brought in the ED with right-sided weakness along with right sided facial droop and aphasia.  Patient was found to be hypotensive after EMS arrived.  Patient was brought in the ED as stroke code,  his symptoms has improved by the time patient arrived in the ED.  CT head showed evolving acute to early subacute right PCA distribution infarct.   Assessment & Plan:   Principal Problem:   TIA (transient ischemic attack) Active Problems:   Coronary artery disease s/p CABG 2001   Hypothyroidism   Venous thromboembolism (VTE)   Hypertension   Type 2 diabetes mellitus with hyperglycemia ([name])   Dyslipidemia due to type 2 diabetes mellitus ([name])   BPH (benign prostatic hyperplasia)   Normocytic anemia   Prolonged QT interval   Acute CVA (cerebrovascular accident) ([name])   Aphasia/ right facial droop secondary to right occipital stroke: Patient presented with right-sided weakness, right facial droop and aphasia. Symptoms resolved by the time patient arrived in the ED. CT head showed evolving acute to early subacute right PCA distribution infarct. Continue telemetry. Frequent neurochecks. PT/OT > skilled nursing facility. Hemoglobin A1c 7.5 Lipid profile shows LDL 37. Echocardiogram showed EF 60 to 65%.  No embolic source. Continue aspirin and warfarin. Patient was on warfarin before admission. MRI: Small to moderate size acute infarction in the inferior right occipital lobe. No hemorrhage. Neuro hospitalist team is following.  Patient had a stroke  despite being on warfarin. Neurology has recommended switching to Eliquis. Discussion about Eliquis has to be done by outpatient cardiologist and oncologist. Cardiologist recommended to continue Coumadin. Oncologist recommended to switch to Eliquis.  Coronary artery disease s/p CABG 2001 Continue carvedilol, aspirin and statin.  Hypothyroidism Continue levothyroxine 100 mcg p.o. daily.  Venous thromboembolism (VTE) [facility]. Continue warfarin per pharmacy.  Hypertension Held meds, resume his blood pressure improves Wife report patient has low blood pressure after he stands up.  Orthostatic hypotension: Patient has significant orthostasis after he stands up. Patient reports having this issue for more than a year. Start fludrocortisone 0.05 mg daily.   Type 2 diabetes mellitus with hyperglycemia ([name]) Carbohydrate modified diet. Continue Lantus 13 units SQ daily. CBG monitoring with RI SS. Hemoglobin A1c 7.4  Dyslipidemia due to type 2 diabetes mellitus ([name])  Continue rosuvastatin 20 mg p.o. daily.  BPH (benign prostatic hyperplasia) Continue tamsulosin 0.4 mg p.o. daily.  Normocytic anemia Monitor H&H.  Urine culture positive for Pseudomonas: Patient started on Keflex at admission. UA not significant. Urine culture grew 100,000 colonies of Pseudomonas. Continue cefepime for 5 days, last date 3/11.  DVT prophylaxis: Coumadin  code Status: Partial Family Communication: Wife and son at bedside discussed in detail. Disposition Plan:    Status is: Inpatient  Remains inpatient appropriate because:Inpatient level of care appropriate due to severity of illness   Dispo: The patient is from: Home              Anticipated d/c is to: SNF in 1-2 days.              Patient currently is not medically stable to  d/c.   Difficult to place patient No   Consultants:   Neurology  Procedures: Echocardiogram, MRI. Antimicrobials:    Anti-infectives (From admission, onward)   Start     Dose/Rate Route Frequency Ordered Stop   09/01/20 1700  ceFEPIme (MAXIPIME) 2 g in sodium chloride 0.9 % 100 mL IVPB        2 g 200 mL/hr over 30 Minutes Intravenous Every 24 hours 09/01/20 1618 09/05/20 2359   08/31/20 1000  cephALEXin (KEFLEX) capsule 500 mg  Status:  Discontinued        500 mg Oral Every 12 hours 08/31/20 0741 09/01/20 1618      Subjective: Patient was seen and examined at bedside. No overnight events,   He reports feeling much improved,  awake and alert oriented x 2.  Patient asks when he can be discharged to nursing home. Dizziness is improving.   Objective: Vitals:   09/05/20 0457 09/05/20 0501 09/05/20 0753 09/05/20 1131  BP: 119/81 (!) 107/48 (!) 141/68 135/70  Pulse: 86 98 80 76  Resp: 15 20 20 20   Temp:   97.9 F (36.6 C) 98.1 F (36.7 C)  TempSrc:   Oral Oral  SpO2: 96% 98% 97% 98%  Weight:      Height:        Intake/Output Summary (Last 24 hours) at 09/05/2020 1415 Last data filed at 09/05/2020 1300 Gross per 24 hour  Intake 1124 ml  Output 1750 ml  Net -626 ml   Filed Weights   08/31/20 0000  Weight: 77.7 kg    Examination:  General exam: Appears calm and comfortable, not in any acute distress. Respiratory system: Clear to auscultation. Respiratory effort normal. Cardiovascular system: S1 & S2 heard, RRR. No JVD, murmurs, rubs, gallops or clicks. No pedal edema. Gastrointestinal system: Abdomen is nondistended, soft and nontender. No organomegaly or masses felt. Normal bowel sounds heard. Central nervous system: Alert and oriented x2.  4/5 grip strength right arm, 5/5 in other extremities. Extremities: No edema, no cyanosis, no clubbing. Skin: No rashes, lesions or ulcers Psychiatry: Judgement and insight appear normal. Mood & affect appropriate.     Data Reviewed: I have personally reviewed following labs and imaging studies  CBC: Recent Labs  Lab 08/31/20 0059  08/31/20 0101 09/01/20 0337 09/02/20 0405 09/05/20 0403  WBC 10.7*  --  10.1 10.3 11.4*  NEUTROABS 6.4  --   --   --   --   HGB 11.0* 10.5* 10.4* 10.6* 10.9*  HCT 33.4* 31.0* 30.7* 32.2* 31.3*  MCV 98.8  --  96.8 99.4 96.3  PLT 194  --  184 180 179   Basic Metabolic Panel: Recent Labs  Lab 08/31/20 0059 08/31/20 0101 08/31/20 0523 09/01/20 0337 09/02/20 0405 09/05/20 0403  NA 135 138  --  136 136 135  K 4.4 4.4  --  4.8 4.5 4.3  CL 104 103  --  106 105 101  CO2 21*  --   --  22 21* 27  GLUCOSE 214* 208*  --  180* 198* 183*  BUN 30* 31*  --  26* 24* 25*  CREATININE 1.37* 1.40*  --  1.21 1.13 1.18  CALCIUM 8.9  --   --  9.0 9.3 9.4  MG  --   --  1.7 2.0 1.7 1.6*  PHOS  --   --   --   --  3.1 3.6   GFR: Estimated Creatinine Clearance: 41.1 mL/min (by C-G formula based on SCr  of 1.18 mg/dL). Liver Function Tests: Recent Labs  Lab 08/31/20 0059  AST 29  ALT 36  ALKPHOS 41  BILITOT 0.5  PROT 6.5  ALBUMIN 3.3*   No results for input(s): LIPASE, AMYLASE in the last 168 hours. No results for input(s): AMMONIA in the last 168 hours. Coagulation Profile: Recent Labs  Lab 09/01/20 0337 09/02/20 0405 09/03/20 0422 09/04/20 0422 09/05/20 0403  INR 3.3* 2.7* 2.0* 1.8* 2.0*   Cardiac Enzymes: No results for input(s): CKTOTAL, CKMB, CKMBINDEX, TROPONINI in the last 168 hours. BNP (last 3 results) No results for input(s): PROBNP in the last 8760 hours. HbA1C: No results for input(s): HGBA1C in the last 72 hours. CBG: Recent Labs  Lab 08/31/20 0054 08/31/20 1815 09/03/20 0029  GLUCAP 199* 156* 174*   Lipid Profile: No results for input(s): CHOL, HDL, LDLCALC, TRIG, CHOLHDL, LDLDIRECT in the last 72 hours. Thyroid Function Tests: No results for input(s): TSH, T4TOTAL, FREET4, T3FREE, THYROIDAB in the last 72 hours. Anemia Panel: No results for input(s): VITAMINB12, FOLATE, FERRITIN, TIBC, IRON, RETICCTPCT in the last 72 hours. Sepsis Labs: No results for  input(s): PROCALCITON, LATICACIDVEN in the last 168 hours.  Recent Results (from the past 240 hour(s))  SARS CORONAVIRUS 2 (TAT 6-24 HRS) Nasopharyngeal Nasopharyngeal Swab     Status: None   Collection Time: 08/31/20  2:47 AM   Specimen: Nasopharyngeal Swab  Result Value Ref Range Status   SARS Coronavirus 2 NEGATIVE NEGATIVE Final    Comment: (NOTE) SARS-CoV-2 target nucleic acids are NOT DETECTED.  The SARS-CoV-2 RNA is generally detectable in upper and lower respiratory specimens during the acute phase of infection. Negative results do not preclude SARS-CoV-2 infection, do not rule out co-infections with other pathogens, and should not be used as the sole basis for treatment or other patient management decisions. Negative results must be combined with clinical observations, patient history, and epidemiological information. The expected result is Negative.  Fact Sheet for Patients: HairSlick.no  Fact Sheet for Healthcare Providers: quierodirigir.com  This test is not yet approved or cleared by the [place] FDA and  has been authorized for detection and/or diagnosis of SARS-CoV-2 by FDA under an Emergency Use Authorization (EUA). This EUA will remain  in effect (meaning this test can be used) for the duration of the COVID-19 declaration under Se ction 564(b)(1) of the Act, 21 U.S.C. section 360bbb-3(b)(1), unless the authorization is terminated or revoked sooner.  Performed at [facility], [address], [place], [place] [address]   Culture, Urine     Status: Abnormal   Collection Time: 08/31/20  9:13 AM   Specimen: Urine, Random  Result Value Ref Range Status   Specimen Description URINE, RANDOM  Final   Special Requests NONE  Final   Culture >=100,000 COLONIES/mL PSEUDOMONAS AERUGINOSA (A)  Final   Report Status 09/02/2020 FINAL  Final   Organism ID, Bacteria PSEUDOMONAS AERUGINOSA (A)  Final       Susceptibility   Pseudomonas aeruginosa - MIC*    CEFTAZIDIME 2 SENSITIVE Sensitive     CIPROFLOXACIN <=0.25 SENSITIVE Sensitive     GENTAMICIN <=1 SENSITIVE Sensitive     IMIPENEM 1 SENSITIVE Sensitive     PIP/TAZO <=4 SENSITIVE Sensitive     CEFEPIME 2 SENSITIVE Sensitive     * >=100,000 COLONIES/mL PSEUDOMONAS AERUGINOSA  SARS CORONAVIRUS 2 (TAT 6-24 HRS) Nasopharyngeal Nasopharyngeal Swab     Status: None   Collection Time: 09/02/20  2:19 PM   Specimen: Nasopharyngeal  Swab  Result Value Ref Range Status   SARS Coronavirus 2 NEGATIVE NEGATIVE Final    Comment: (NOTE) SARS-CoV-2 target nucleic acids are NOT DETECTED.  The SARS-CoV-2 RNA is generally detectable in upper and lower respiratory specimens during the acute phase of infection. Negative results do not preclude SARS-CoV-2 infection, do not rule out co-infections with other pathogens, and should not be used as the sole basis for treatment or other patient management decisions. Negative results must be combined with clinical observations, patient history, and epidemiological information. The expected result is Negative.  Fact Sheet for Patients: HairSlick.no  Fact Sheet for Healthcare Providers: quierodirigir.com  This test is not yet approved or cleared by the [place] FDA and  has been authorized for detection and/or diagnosis of SARS-CoV-2 by FDA under an Emergency Use Authorization (EUA). This EUA will remain  in effect (meaning this test can be used) for the duration of the COVID-19 declaration under Se ction 564(b)(1) of the Act, 21 U.S.C. section 360bbb-3(b)(1), unless the authorization is terminated or revoked sooner.  Performed at [facility], [address], [place], [place] [address]      Radiology Studies: No results found. Scheduled Meds: . aspirin  81 mg Oral Daily  . cholecalciferol  1,000 Units Oral Daily  . feeding supplement   237 mL Oral BID BM  . fludrocortisone  0.05 mg Oral Daily  . insulin glargine  13 Units Subcutaneous QHS  . levothyroxine  100 mcg Oral Daily  . mirtazapine  7.5 mg Oral QHS  . multivitamin with minerals  1 tablet Oral Daily  . rosuvastatin  20 mg Oral QHS  . tamsulosin  0.4 mg Oral QPC supper  . warfarin  4 mg Oral ONCE-1600  . Warfarin - Pharmacist Dosing Inpatient   Does not apply q1600   Continuous Infusions: . ceFEPime (MAXIPIME) IV 2 g (09/04/20 1608)     LOS: 5 days    Time spent: 25 mins    Larell Baney, MD [facility]   If 7PM-7AM, please contact night-coverage

## 2020-09-05 NOTE — Progress Notes (Signed)
Physical Therapy Treatment Patient Details Name: Thomas [name] MRN: [id number] DOB: Jun 10, 1929 Today's Date: 09/05/2020    History of Present Illness Pt is an 87 y/o male admitted 3/6 secondary to worsening R sided weakness. Workup pending, but CT showed acute to early subacute right PCA distribution. PMH includes CVA, CAD s/p CABG, HTN, macular degeneration, VTE, and DM.    PT Comments    Assessed orthostatic vitals (see below). Patient reports dizziness upon standing however improved from previous session. Patient requires modA for sit to stand from EOB. Patient able to perform stand pivot with RW and minA for balance. Continue to recommend SNF for ongoing Physical Therapy.      Orthostatic BPs  Supine 123/54 HR 70  Sitting 113/66 HR 88  Standing 100/58 HR 95  Sitting 116/57 HR 85      Follow Up Recommendations  Supervision/Assistance - 24 hour;SNF     Equipment Recommendations  None recommended by PT    Recommendations for Other Services       Precautions / Restrictions Precautions Precautions: Fall;Other (comment) Precaution Comments: Watch BP - orthostatic Restrictions Weight Bearing Restrictions: No    Mobility  Bed Mobility Overal bed mobility: Needs Assistance Bed Mobility: Supine to Sit     Supine to sit: Min assist     General bed mobility comments: minA for trunk elevation to come to sitting    Transfers Overall transfer level: Needs assistance Equipment used: Rolling Shamiracle Gorden (2 wheeled) Transfers: Sit to/from [employer] Sit to Stand: Mod assist Stand pivot transfers: Min assist       General transfer comment: modA for boost up, checked orthostatics. MinA for balance with stand pivot transfer  Ambulation/Gait                 Stairs             Wheelchair Mobility    Modified Rankin (Stroke Patients Only) Modified Rankin (Stroke Patients Only) Pre-Morbid Rankin Score: No symptoms Modified Rankin:  Moderately severe disability     Balance Overall balance assessment: Needs assistance Sitting-balance support: Feet supported;No upper extremity supported Sitting balance-Leahy Scale: Fair     Standing balance support: Bilateral upper extremity supported;During functional activity Standing balance-Leahy Scale: Poor Standing balance comment: reliant on B UE support and external assist                            Cognition Arousal/Alertness: Awake/alert Behavior During Therapy: WFL for tasks assessed/performed Overall Cognitive Status: Impaired/Different from baseline Area of Impairment: Problem solving                             Problem Solving: Slow processing;Requires verbal cues General Comments: requires increased time to process and problem solve      Exercises General Exercises - Lower Extremity Long Arc Quad: Both;10 reps;Seated Hip Flexion/Marching: Both;10 reps;Seated    General Comments General comments (skin integrity, edema, etc.): see note for orthostatics      Pertinent Vitals/Pain Pain Assessment: No/denies pain    Home Living                      Prior Function            PT Goals (current goals can now be found in the care plan section) Acute Rehab PT Goals Patient Stated Goal: to feel better PT Goal Formulation: With patient  Time For Goal Achievement: 09/14/20 Potential to Achieve Goals: Good Progress towards PT goals: Progressing toward goals    Frequency    Min 3X/week      PT Plan Current plan remains appropriate    Co-evaluation              AM-PAC PT "6 Clicks" Mobility   Outcome Measure  Help needed turning from your back to your side while in a flat bed without using bedrails?: A Little Help needed moving from lying on your back to sitting on the side of a flat bed without using bedrails?: A Little Help needed moving to and from a bed to a chair (including a wheelchair)?: A Little Help  needed standing up from a chair using your arms (e.g., wheelchair or bedside chair)?: A Lot Help needed to walk in hospital room?: A Lot Help needed climbing 3-5 steps with a railing? : A Lot 6 Click Score: 15    End of Session Equipment Utilized During Treatment: Gait belt Activity Tolerance: Patient tolerated treatment well Patient left: in chair;with call bell/phone within reach;with chair alarm set Nurse Communication: Mobility status PT Visit Diagnosis: Unsteadiness on feet (R26.81);Muscle weakness (generalized) (M62.81)     Time: 7262-0355 PT Time Calculation (min) (ACUTE ONLY): 26 min  Charges:  $Therapeutic Activity: 23-37 mins                     Noemy Hallmon A. Dan Humphreys PT, DPT Acute Rehabilitation Services Pager [phone] Office [phone]    Viviann Spare 09/05/2020, 12:36 PM

## 2020-09-05 NOTE — TOC Progression Note (Signed)
Transition of Care [facility]) - Progression Note    Patient Details  Name: Trequan Marsolek MRN: [id number] Date of Birth: [birth year]  Transition of Care [facility]) CM/SW Contact  Baldemar Len[place], [place] Phone Number: 09/05/2020, 4:33 PM  Clinical Narrative:   CSW noting that patient did better with PT with his orthostatic vitals, sent insurance authorization request back in to [facility] for review. [facility] unable to accept over the weekend, but hopeful for admission to SNF on Monday if stable over the weekend. CSW to follow.    Expected Discharge Plan: Skilled Nursing Facility Barriers to Discharge: Continued Medical Work up  Expected Discharge Plan and Services Expected Discharge Plan: Skilled Nursing Facility In-house Referral: Clinical Social Work Discharge Planning Services: CM Consult Post Acute Care Choice: Skilled Nursing Facility Living arrangements for the past 2 months: Single Family Home                                       Social Determinants of Health (SDOH) Interventions    Readmission Risk Interventions No flowsheet data found.

## 2020-09-05 NOTE — Progress Notes (Signed)
ANTICOAGULATION CONSULT NOTE - Follow-Up Consult  Pharmacy Consult for Warfarin Indication: Hx DVT, CVA   Patient Measurements: Height: 5\' 8"  (172.7 cm) Weight: 77.7 kg (171 lb 4.8 oz) IBW/kg (Calculated) : 68.4   Vital Signs: Temp: 97.9 F (36.6 C) (03/11 0753) Temp Source: Oral (03/11 0753) BP: 141/68 (03/11 0753) Pulse Rate: 80 (03/11 0753)  Labs: Recent Labs    09/03/20 0422 09/04/20 0422 09/05/20 0403  HGB  --   --  10.9*  HCT  --   --  31.3*  PLT  --   --  179  LABPROT 21.7* 19.8* 22.0*  INR 2.0* 1.8* 2.0*  CREATININE  --   --  1.18    Estimated Creatinine Clearance: 41.1 mL/min (by C-G formula based on SCr of 1.18 mg/dL).   Assessment: 89 YOM on warfarin PTA for hx DVT who presented on 3/6 with new CVA despite therapeutic INR. Decision to continue warfarin (cards) vs switch to DOAC (neuro + onc) being discussed with the patient's care team. Pharmacy consulted to continue with warfarin dosing at this time.  INR today is therapeutic (INR 2 << 1.8, goal of 2-3). Drop this admit likely still related to dose held on 3/7. CBC has been stable - no bleeding noted. Will attempt to get back on PTA dosing.  PTA dose of coumadin 3mg  po q T,Th,Sat and 4mg  MWFSu.   Goal of Therapy:  INR 2-3 Monitor platelets by anticoagulation protocol: Yes   Plan:  - Warfarin 4 mg x 1 dose at 1600 today - Daily PT/INR, CBC q72h - Will continue to monitor for any signs/symptoms of bleeding and will follow up with PT/INR in the a.m.    Thank you for allowing pharmacy to be a part of this patient's care.  5/7, PharmD, BCPS Clinical Pharmacist Clinical phone for 09/05/2020: 09/05/2020 8:16 AM   **Pharmacist phone directory can now be found on [email or web address] ([email or web address]).  Listed under [facility].

## 2020-09-05 NOTE — Progress Notes (Signed)
Orthostatic VS   09/05/20 0452 09/05/20 0457 09/05/20 0501  Vitals  BP (!) 152/75 119/81 (!) 107/48  MAP (mmHg) 97 94 65  BP Location Right Arm Right Arm Right Arm  BP Method Automatic Automatic Automatic  Patient Position (if appropriate) Lying Sitting Standing  Pulse Rate 75 86 98  Pulse Rate Source Dinamap Dinamap Dinamap  Resp 15 15 20   Level of Consciousness  Level of Consciousness  --  Alert Alert  MEWS COLOR  MEWS Score Color Green  Oxygen Therapy  SpO2 97 % 96 % 98 %  O2 Device Room Riki Sheer

## 2020-09-06 LAB — MAGNESIUM: Magnesium: 1.9 mg/dL (ref 1.7–2.4)

## 2020-09-06 LAB — BASIC METABOLIC PANEL
Anion gap: 6 (ref 5–15)
BUN: 30 mg/dL — ABNORMAL HIGH (ref 8–23)
CO2: 28 mmol/L (ref 22–32)
Calcium: 9.3 mg/dL (ref 8.9–10.3)
Chloride: 99 mmol/L (ref 98–111)
Creatinine, Ser: 1.14 mg/dL (ref 0.61–1.24)
GFR, Estimated: >60 mL/min (ref >60)
Glucose, Bld: 218 mg/dL — ABNORMAL HIGH (ref 70–99)
Potassium: 4.5 mmol/L (ref 3.5–5.1)
Sodium: 133 mmol/L — ABNORMAL LOW (ref 135–145)

## 2020-09-06 LAB — PHOSPHORUS: Phosphorus: 3.3 mg/dL (ref 2.5–4.6)

## 2020-09-06 LAB — GLUCOSE, CAPILLARY: Glucose-Capillary: 386 mg/dL — ABNORMAL HIGH (ref 70–99)

## 2020-09-06 LAB — PROTIME-INR
INR: 2.3 — ABNORMAL HIGH (ref 0.8–1.2)
Prothrombin Time: 24.3 seconds — ABNORMAL HIGH (ref 11.4–15.2)

## 2020-09-06 MED ORDER — WARFARIN SODIUM 3 MG PO TABS
3.0000 mg | ORAL_TABLET | ORAL | Status: DC
  Administered 2020-09-06: 3 mg via ORAL
  Filled 2020-09-06: qty 1

## 2020-09-06 MED ORDER — WARFARIN SODIUM 4 MG PO TABS
4.0000 mg | ORAL_TABLET | ORAL | Status: DC
  Administered 2020-09-07 – 2020-09-08 (×2): 4 mg via ORAL
  Filled 2020-09-06 (×2): qty 1

## 2020-09-06 NOTE — Progress Notes (Signed)
PROGRESS NOTE    Thomas [name]  ZOX:[id number] DOB: 11/10/1928 DOA: 08/31/2020 PCP: Ardith DarkParker, Caleb M, MD   Brief Narrative:  This 87 years old male with PMH significant for osteoarthritis at multiple sites, history of DVT, CAD/CABG x 6, history of angioplasty with stent placement, type 2 diabetes, hyperlipidemia, hypertension, hypothyroidism, chronic kidney disease, hearing impairment, macular degeneration, history of nonhemorrhagic stroke brought in the ED with right-sided weakness along with right sided facial droop and aphasia.  Patient was found to be hypotensive after EMS arrived.  Patient was brought in the ED as stroke code,  his symptoms has improved by the time patient arrived in the ED.  CT head showed evolving acute to early subacute right PCA distribution infarct.   Assessment & Plan:   Principal Problem:   TIA (transient ischemic attack) Active Problems:   Coronary artery disease s/p CABG 2001   Hypothyroidism   Venous thromboembolism (VTE)   Hypertension   Type 2 diabetes mellitus with hyperglycemia ([name])   Dyslipidemia due to type 2 diabetes mellitus ([name])   BPH (benign prostatic hyperplasia)   Normocytic anemia   Prolonged QT interval   Acute CVA (cerebrovascular accident) ([name])   Aphasia/ right facial droop secondary to right occipital stroke: Patient presented with right-sided weakness, right facial droop and aphasia. Symptoms resolved by the time patient arrived in the ED. CT head showed evolving acute to early subacute right PCA distribution infarct. Continue telemetry. Frequent neurochecks. PT/OT > skilled nursing facility. Hemoglobin A1c 7.5 Lipid profile shows LDL 37. Echocardiogram showed EF 60 to 65%.  No embolic source. Continue aspirin and warfarin. Patient was on warfarin before admission. MRI: Small to moderate size acute infarction in the inferior right occipital lobe. No hemorrhage. Neuro hospitalist team is following.  Patient had a stroke  despite being on warfarin. Neurology has recommended switching to Eliquis. Discussion about Eliquis has to be done by outpatient cardiologist and oncologist. Cardiologist recommended to continue Coumadin. Oncologist recommended to switch to Eliquis.  Coronary artery disease s/p CABG 2001 Continue carvedilol, aspirin and statin.  Hypothyroidism Continue levothyroxine 100 mcg p.o. daily.  Venous thromboembolism (VTE) [facility] Continue warfarin per pharmacy.  Hypertension Held meds, resume his blood pressure improves Wife report patient has low blood pressure after he stands up.  Orthostatic hypotension: Patient has significant orthostasis after he stands up. Patient reports having this issue for more than a year. Continue fludrocortisone 0.05 mg daily.   Type 2 diabetes mellitus with hyperglycemia ([name]) Carbohydrate modified diet. Continue Lantus 13 units SQ daily. CBG monitoring with RI SS. Hemoglobin A1c 7.4  Dyslipidemia due to type 2 diabetes mellitus ([name])  Continue rosuvastatin 20 mg p.o. daily.  BPH (benign prostatic hyperplasia) Continue tamsulosin 0.4 mg p.o. daily.  Normocytic anemia Monitor H&H.  Urine culture positive for Pseudomonas: Patient started on Keflex at admission. UA not significant. Urine culture grew 100,000 colonies of Pseudomonas. Completed cefepime for 5 days, last date 3/11.  DVT prophylaxis: Coumadin  code Status: Partial Family Communication: Wife and son at bedside discussed in detail. Disposition Plan:    Status is: Inpatient  Remains inpatient appropriate because:Inpatient level of care appropriate due to severity of illness   Dispo: The patient is from: Home              Anticipated d/c is to: SNF in 1-2 days.              Patient currently is not medically stable to  d/c.   Difficult to place patient No   Consultants:   Neurology  Procedures: Echocardiogram, MRI. Antimicrobials:    Anti-infectives (From admission, onward)   Start     Dose/Rate Route Frequency Ordered Stop   09/01/20 1700  ceFEPIme (MAXIPIME) 2 g in sodium chloride 0.9 % 100 mL IVPB        2 g 200 mL/hr over 30 Minutes Intravenous Every 24 hours 09/01/20 1618 09/05/20 1736   08/31/20 1000  cephALEXin (KEFLEX) capsule 500 mg  Status:  Discontinued        500 mg Oral Every 12 hours 08/31/20 0741 09/01/20 1618      Subjective: Patient was seen and examined at bedside. No overnight events,   He reports feeling much improved,  awake and alert oriented x 2.  Patient was seen when he was having breakfast.  Denies any dizziness. Patient will be discharged to skilled nursing facility on Monday.   Objective: Vitals:   09/05/20 2350 09/06/20 0848 09/06/20 0902 09/06/20 1122  BP: (!) 164/68 130/76  131/60  Pulse: 72 87  75  Resp: 17 18  18   Temp: 97.8 F (36.6 C) 98.3 F (36.8 C)  98.4 F (36.9 C)  TempSrc:  Oral Oral Oral  SpO2: 95% 98% 97%   Weight:      Height:        Intake/Output Summary (Last 24 hours) at 09/06/2020 1358 Last data filed at 09/06/2020 1100 Gross per 24 hour  Intake 560 ml  Output 1000 ml  Net -440 ml   Filed Weights   08/31/20 0000  Weight: 77.7 kg    Examination:  General exam: Appears calm and comfortable, not in any acute distress. Respiratory system: Clear to auscultation. Respiratory effort normal. Cardiovascular system: S1 & S2 heard, RRR. No JVD, murmurs, rubs, gallops or clicks. No pedal edema. Gastrointestinal system: Abdomen is nondistended, soft and nontender. No organomegaly or masses felt. Normal bowel sounds heard. Central nervous system: Alert and oriented x2.  4/5 grip strength right arm, 5/5 in other extremities. Extremities: No edema, no cyanosis, no clubbing. Skin: No rashes, lesions or ulcers Psychiatry: Judgement and insight appear normal. Mood & affect appropriate.     Data Reviewed: I have personally reviewed following labs and imaging  studies  CBC: Recent Labs  Lab 08/31/20 0059 08/31/20 0101 09/01/20 0337 09/02/20 0405 09/05/20 0403  WBC 10.7*  --  10.1 10.3 11.4*  NEUTROABS 6.4  --   --   --   --   HGB 11.0* 10.5* 10.4* 10.6* 10.9*  HCT 33.4* 31.0* 30.7* 32.2* 31.3*  MCV 98.8  --  96.8 99.4 96.3  PLT 194  --  184 180 179   Basic Metabolic Panel: Recent Labs  Lab 08/31/20 0059 08/31/20 0101 08/31/20 0523 09/01/20 0337 09/02/20 0405 09/05/20 0403 09/06/20 0250  NA 135 138  --  136 136 135 133*  K 4.4 4.4  --  4.8 4.5 4.3 4.5  CL 104 103  --  106 105 101 99  CO2 21*  --   --  22 21* 27 28  GLUCOSE 214* 208*  --  180* 198* 183* 218*  BUN 30* 31*  --  26* 24* 25* 30*  CREATININE 1.37* 1.40*  --  1.21 1.13 1.18 1.14  CALCIUM 8.9  --   --  9.0 9.3 9.4 9.3  MG  --   --  1.7 2.0 1.7 1.6* 1.9  PHOS  --   --   --   --  3.1 3.6 3.3   GFR: Estimated Creatinine Clearance: 42.5 mL/min (by C-G formula based on SCr of 1.14 mg/dL). Liver Function Tests: Recent Labs  Lab 08/31/20 0059  AST 29  ALT 36  ALKPHOS 41  BILITOT 0.5  PROT 6.5  ALBUMIN 3.3*   No results for input(s): LIPASE, AMYLASE in the last 168 hours. No results for input(s): AMMONIA in the last 168 hours. Coagulation Profile: Recent Labs  Lab 09/02/20 0405 09/03/20 0422 09/04/20 0422 09/05/20 0403 09/06/20 0250  INR 2.7* 2.0* 1.8* 2.0* 2.3*   Cardiac Enzymes: No results for input(s): CKTOTAL, CKMB, CKMBINDEX, TROPONINI in the last 168 hours. BNP (last 3 results) No results for input(s): PROBNP in the last 8760 hours. HbA1C: No results for input(s): HGBA1C in the last 72 hours. CBG: Recent Labs  Lab 08/31/20 0054 08/31/20 1815 09/03/20 0029 09/05/20 2217  GLUCAP 199* 156* 174* 267*   Lipid Profile: No results for input(s): CHOL, HDL, LDLCALC, TRIG, CHOLHDL, LDLDIRECT in the last 72 hours. Thyroid Function Tests: No results for input(s): TSH, T4TOTAL, FREET4, T3FREE, THYROIDAB in the last 72 hours. Anemia Panel: No  results for input(s): VITAMINB12, FOLATE, FERRITIN, TIBC, IRON, RETICCTPCT in the last 72 hours. Sepsis Labs: No results for input(s): PROCALCITON, LATICACIDVEN in the last 168 hours.  Recent Results (from the past 240 hour(s))  SARS CORONAVIRUS 2 (TAT 6-24 HRS) Nasopharyngeal Nasopharyngeal Swab     Status: None   Collection Time: 08/31/20  2:47 AM   Specimen: Nasopharyngeal Swab  Result Value Ref Range Status   SARS Coronavirus 2 NEGATIVE NEGATIVE Final    Comment: (NOTE) SARS-CoV-2 target nucleic acids are NOT DETECTED.  The SARS-CoV-2 RNA is generally detectable in upper and lower respiratory specimens during the acute phase of infection. Negative results do not preclude SARS-CoV-2 infection, do not rule out co-infections with other pathogens, and should not be used as the sole basis for treatment or other patient management decisions. Negative results must be combined with clinical observations, patient history, and epidemiological information. The expected result is Negative.  Fact Sheet for Patients: HairSlick.no  Fact Sheet for Healthcare Providers: quierodirigir.com  This test is not yet approved or cleared by the [place] FDA and  has been authorized for detection and/or diagnosis of SARS-CoV-2 by FDA under an Emergency Use Authorization (EUA). This EUA will remain  in effect (meaning this test can be used) for the duration of the COVID-19 declaration under Se ction 564(b)(1) of the Act, 21 U.S.C. section 360bbb-3(b)(1), unless the authorization is terminated or revoked sooner.  Performed at [facility], [address] [place], [place] [address]   Culture, Urine     Status: Abnormal   Collection Time: 08/31/20  9:13 AM   Specimen: Urine, Random  Result Value Ref Range Status   Specimen Description URINE, RANDOM  Final   Special Requests NONE  Final   Culture >=100,000 COLONIES/mL PSEUDOMONAS  AERUGINOSA (A)  Final   Report Status 09/02/2020 FINAL  Final   Organism ID, Bacteria PSEUDOMONAS AERUGINOSA (A)  Final      Susceptibility   Pseudomonas aeruginosa - MIC*    CEFTAZIDIME 2 SENSITIVE Sensitive     CIPROFLOXACIN <=0.25 SENSITIVE Sensitive     GENTAMICIN <=1 SENSITIVE Sensitive     IMIPENEM 1 SENSITIVE Sensitive     PIP/TAZO <=4 SENSITIVE Sensitive     CEFEPIME 2 SENSITIVE Sensitive     * >=100,000 COLONIES/mL PSEUDOMONAS AERUGINOSA  SARS CORONAVIRUS 2 (TAT 6-24 HRS) Nasopharyngeal  Nasopharyngeal Swab     Status: None   Collection Time: 09/02/20  2:19 PM   Specimen: Nasopharyngeal Swab  Result Value Ref Range Status   SARS Coronavirus 2 NEGATIVE NEGATIVE Final    Comment: (NOTE) SARS-CoV-2 target nucleic acids are NOT DETECTED.  The SARS-CoV-2 RNA is generally detectable in upper and lower respiratory specimens during the acute phase of infection. Negative results do not preclude SARS-CoV-2 infection, do not rule out co-infections with other pathogens, and should not be used as the sole basis for treatment or other patient management decisions. Negative results must be combined with clinical observations, patient history, and epidemiological information. The expected result is Negative.  Fact Sheet for Patients: HairSlick.no  Fact Sheet for Healthcare Providers: quierodirigir.com  This test is not yet approved or cleared by the [place] FDA and  has been authorized for detection and/or diagnosis of SARS-CoV-2 by FDA under an Emergency Use Authorization (EUA). This EUA will remain  in effect (meaning this test can be used) for the duration of the COVID-19 declaration under Se ction 564(b)(1) of the Act, 21 U.S.C. section 360bbb-3(b)(1), unless the authorization is terminated or revoked sooner.  Performed at [facility], [address], [place], [place] [address]      Radiology Studies: No  results found. Scheduled Meds: . aspirin  81 mg Oral Daily  . cholecalciferol  1,000 Units Oral Daily  . feeding supplement  237 mL Oral BID BM  . fludrocortisone  0.05 mg Oral Daily  . insulin glargine  13 Units Subcutaneous QHS  . levothyroxine  100 mcg Oral Daily  . mirtazapine  7.5 mg Oral QHS  . multivitamin with minerals  1 tablet Oral Daily  . rosuvastatin  20 mg Oral QHS  . tamsulosin  0.4 mg Oral QPC supper  . [START ON 09/07/2020] warfarin  4 mg Oral Once per day on Sun Mon Wed Fri   And  . warfarin  3 mg Oral Once per day on Tue Thu Sat  . Warfarin - Pharmacist Dosing Inpatient   Does not apply q1600   Continuous Infusions:    LOS: 6 days    Time spent: 25 mins    Raziel Koenigs, MD [facility]   If 7PM-7AM, please contact night-coverage

## 2020-09-06 NOTE — Plan of Care (Signed)
  Problem: Education: Goal: Knowledge of disease or condition will improve Outcome: Progressing Goal: Knowledge of patient specific risk factors addressed and post discharge goals established will improve Outcome: Progressing   Problem: Education: Goal: Knowledge of General Education information will improve Description: Including pain rating scale, medication(s)/side effects and non-pharmacologic comfort measures Outcome: Progressing   Problem: Activity: Goal: Risk for activity intolerance will decrease Outcome: Progressing   Problem: Nutrition: Goal: Adequate nutrition will be maintained Outcome: Progressing

## 2020-09-06 NOTE — Progress Notes (Signed)
ANTICOAGULATION CONSULT NOTE - Follow-Up Consult  Pharmacy Consult for Warfarin Indication: Hx DVT, CVA   Patient Measurements: Height: 5\' 8"  (172.7 cm) Weight: 77.7 kg (171 lb 4.8 oz) IBW/kg (Calculated) : 68.4   Vital Signs: Temp: 97.8 F (36.6 C) (03/11 2350) BP: 164/68 (03/11 2350) Pulse Rate: 72 (03/11 2350)  Labs: Recent Labs    09/04/20 0422 09/05/20 0403 09/06/20 0250  HGB  --  10.9*  --   HCT  --  31.3*  --   PLT  --  179  --   LABPROT 19.8* 22.0* 24.3*  INR 1.8* 2.0* 2.3*  CREATININE  --  1.18 1.14    Estimated Creatinine Clearance: 42.5 mL/min (by C-G formula based on SCr of 1.14 mg/dL).   Assessment: 89 YOM on warfarin PTA for hx DVT who presented on 3/6 with new CVA despite therapeutic INR. Decision to continue warfarin (cards) vs switch to DOAC (neuro + onc) being discussed with the patient's care team. Pharmacy consulted to continue with warfarin dosing at this time.  INR today is therapeutic (INR 2.3, goal of 2-3).  CBC has been stable - no bleeding noted. Will attempt to get back on PTA dosing.  PTA dose of coumadin 3mg  po q T,Th,Sat and 4mg  MWFSu.   Goal of Therapy:  INR 2-3 Monitor platelets by anticoagulation protocol: Yes   Plan:  - Warfarin 4 mg qday except 3mg  TTS - Daily PT/INR, CBC q72h - Will continue to monitor for any signs/symptoms of bleeding and will follow up with PT/INR in the a.m.    11/06/20, PharmD, BCIDP, AAHIVP, CPP Infectious Disease Pharmacist 09/06/2020 8:36 AM

## 2020-09-07 LAB — RESP PANEL BY RT-PCR (FLU A&B, COVID) ARPGX2
Influenza A by PCR: NEGATIVE
Influenza B by PCR: NEGATIVE
SARS Coronavirus 2 by RT PCR: NEGATIVE

## 2020-09-07 LAB — GLUCOSE, CAPILLARY
Glucose-Capillary: 218 mg/dL — ABNORMAL HIGH (ref 70–99)
Glucose-Capillary: 298 mg/dL — ABNORMAL HIGH (ref 70–99)
Glucose-Capillary: 335 mg/dL — ABNORMAL HIGH (ref 70–99)
Glucose-Capillary: 236 mg/dL — ABNORMAL HIGH (ref 70–99)

## 2020-09-07 LAB — PROTIME-INR
INR: 2.3 — ABNORMAL HIGH (ref 0.8–1.2)
Prothrombin Time: 24.3 seconds — ABNORMAL HIGH (ref 11.4–15.2)

## 2020-09-07 NOTE — Progress Notes (Signed)
ANTICOAGULATION CONSULT NOTE - Follow-Up Consult  Pharmacy Consult for Warfarin Indication: Hx DVT, CVA   Patient Measurements: Height: 5\' 8"  (172.7 cm) Weight: 77.7 kg (171 lb 4.8 oz) IBW/kg (Calculated) : 68.4   Vital Signs: Temp: 98 F (36.7 C) (03/13 0840) Temp Source: Oral (03/13 0840) BP: 113/62 (03/13 0840) Pulse Rate: 92 (03/13 0840)  Labs: Recent Labs    09/05/20 0403 09/06/20 0250 09/07/20 0357  HGB 10.9*  --   --   HCT 31.3*  --   --   PLT 179  --   --   LABPROT 22.0* 24.3* 24.3*  INR 2.0* 2.3* 2.3*  CREATININE 1.18 1.14  --     Estimated Creatinine Clearance: 42.5 mL/min (by C-G formula based on SCr of 1.14 mg/dL).   Assessment: 89 YOM on warfarin PTA for hx DVT who presented on 3/6 with new CVA despite therapeutic INR. Decision to continue warfarin (cards) vs switch to DOAC (neuro + onc) being discussed with the patient's care team. Pharmacy consulted to continue with warfarin dosing at this time.  INR continue to be therapeutic (INR 2.3, goal of 2-3).  CBC has been stable - no bleeding noted. Continue home regimen.  PTA dose of coumadin 3mg  po q T,Th,Sat and 4mg  MWFSu.   Goal of Therapy:  INR 2-3 Monitor platelets by anticoagulation protocol: Yes   Plan:  - Warfarin 4 mg qday except 3mg  TTS - Daily PT/INR, CBC q72h - Will continue to monitor for any signs/symptoms of bleeding    09/09/20, PharmD, BCIDP, AAHIVP, CPP Infectious Disease Pharmacist 09/07/2020 9:03 AM

## 2020-09-07 NOTE — Progress Notes (Signed)
PROGRESS NOTE    Thomas [name]  UVO:[id number] DOB: 10/13/1928 DOA: 08/31/2020 PCP: Ardith Dark, MD   Brief Narrative:  This 87 years old male with PMH significant for osteoarthritis at multiple sites, history of DVT, CAD/CABG x 6, history of angioplasty with stent placement, type 2 diabetes, hyperlipidemia, hypertension, hypothyroidism, chronic kidney disease, hearing impairment, macular degeneration, history of nonhemorrhagic stroke brought in the ED with right-sided weakness along with right sided facial droop and aphasia.  Patient was found to be hypotensive after EMS arrived.  Patient was brought in the ED as stroke code,  his symptoms has improved by the time patient arrived in the ED.  CT head showed evolving acute to early subacute right PCA distribution infarct.   Assessment & Plan:   Principal Problem:   TIA (transient ischemic attack) Active Problems:   Coronary artery disease s/p CABG 2001   Hypothyroidism   Venous thromboembolism (VTE)   Hypertension   Type 2 diabetes mellitus with hyperglycemia ([name])   Dyslipidemia due to type 2 diabetes mellitus ([name])   BPH (benign prostatic hyperplasia)   Normocytic anemia   Prolonged QT interval   Acute CVA (cerebrovascular accident) ([name])   Aphasia/ right facial droop secondary to right occipital stroke: Patient presented with right-sided weakness, right facial droop and aphasia. Symptoms resolved by the time patient arrived in the ED. CT head showed evolving acute to early subacute right PCA distribution infarct. Continue telemetry. Frequent neurochecks. PT/OT > skilled nursing facility. Hemoglobin A1c 7.5 Lipid profile shows LDL 37. Echocardiogram showed EF 60 to 65%.  No embolic source. Continue aspirin and warfarin. Patient was on warfarin before admission. MRI: Small to moderate size acute infarction in the inferior right occipital lobe. No hemorrhage. Neuro hospitalist team is following.  Patient had a stroke  despite being on warfarin. Neurology has recommended switching to Eliquis. Discussion about Eliquis has to be done by outpatient cardiologist and oncologist. Cardiologist recommended to continue Coumadin. Oncologist recommended to switch to Eliquis.  Coronary artery disease s/p CABG 2001 Continue carvedilol, aspirin and statin.  Hypothyroidism Continue levothyroxine 100 mcg p.o. daily.  Venous thromboembolism (VTE) No need to hold [facility] per neurology. Continue warfarin per pharmacy.  Hypertension Held meds, resume if  his blood pressure improves. Wife report patient has low blood pressure after he stands up.  Orthostatic hypotension: Patient has significant orthostasis after he stands up. Patient reports having this issue for more than a year. Continue fludrocortisone 0.05 mg daily.   Type 2 diabetes mellitus with hyperglycemia ([name]) Carbohydrate modified diet. Continue Lantus 13 units SQ daily. CBG monitoring with RI SS. Hemoglobin A1c 7.4  Dyslipidemia due to type 2 diabetes mellitus ([name])  Continue rosuvastatin 20 mg p.o. daily.  BPH (benign prostatic hyperplasia) Continue tamsulosin 0.4 mg p.o. daily.  Normocytic anemia Monitor H&H. Hb remains stable above 10.9  Urine culture positive for Pseudomonas: Patient started on Keflex at admission. UA not significant. Urine culture grew 100,000 colonies of Pseudomonas. Completed cefepime for 5 days, last date 3/11.  DVT prophylaxis: Coumadin  code Status: Partial Family Communication: Wife and son at bedside discussed in detail. Disposition Plan:    Status is: Inpatient  Remains inpatient appropriate because:Inpatient level of care appropriate due to severity of illness   Dispo: The patient is from: Home              Anticipated d/c is to: SNF in 1-2 days.  Patient currently is not medically stable to d/c.   Difficult to place patient No   Consultants:   Neurology  Procedures:  Echocardiogram, MRI. Antimicrobials:   Anti-infectives (From admission, onward)   Start     Dose/Rate Route Frequency Ordered Stop   09/01/20 1700  ceFEPIme (MAXIPIME) 2 g in sodium chloride 0.9 % 100 mL IVPB        2 g 200 mL/hr over 30 Minutes Intravenous Every 24 hours 09/01/20 1618 09/05/20 1736   08/31/20 1000  cephALEXin (KEFLEX) capsule 500 mg  Status:  Discontinued        500 mg Oral Every 12 hours 08/31/20 0741 09/01/20 1618      Subjective: Patient was seen and examined at bedside. No overnight events,   He reports feeling much improved,  awake and alert oriented x 2.  Patient denies any dizziness. He reports slept well through out night. Patient will be discharged to skilled nursing facility on Monday.   Objective: Vitals:   09/06/20 2356 09/07/20 0428 09/07/20 0840 09/07/20 1223  BP: 125/66 115/65 113/62 124/60  Pulse: (!) 101 83 92 75  Resp: 18 18 18 18   Temp: 99.5 F (37.5 C) 99.1 F (37.3 C) 98 F (36.7 C) 98.7 F (37.1 C)  TempSrc: Oral Oral Oral Oral  SpO2: 95% 95% (!) 88% 97%  Weight:      Height:        Intake/Output Summary (Last 24 hours) at 09/07/2020 1400 Last data filed at 09/07/2020 0906 Gross per 24 hour  Intake 630 ml  Output 1575 ml  Net -945 ml   Filed Weights   08/31/20 0000  Weight: 77.7 kg    Examination:  General exam: Appears calm and comfortable, not in any acute distress. Respiratory system: Clear to auscultation. Respiratory effort normal. Cardiovascular system: S1 & S2 heard, RRR. No JVD, murmurs, rubs, gallops or clicks. No pedal edema. Gastrointestinal system: Abdomen is nondistended, soft and nontender. No organomegaly or masses felt. Normal bowel sounds heard. Central nervous system: Alert and oriented x2.  4/5 grip strength right arm, 5/5 in other extremities. Extremities: No edema, no cyanosis, no clubbing. Skin: No rashes, lesions or ulcers Psychiatry: Judgement and insight appear normal. Mood & affect appropriate.      Data Reviewed: I have personally reviewed following labs and imaging studies  CBC: Recent Labs  Lab 09/01/20 0337 09/02/20 0405 09/05/20 0403  WBC 10.1 10.3 11.4*  HGB 10.4* 10.6* 10.9*  HCT 30.7* 32.2* 31.3*  MCV 96.8 99.4 96.3  PLT 184 180 179   Basic Metabolic Panel: Recent Labs  Lab 09/01/20 0337 09/02/20 0405 09/05/20 0403 09/06/20 0250  NA 136 136 135 133*  K 4.8 4.5 4.3 4.5  CL 106 105 101 99  CO2 22 21* 27 28  GLUCOSE 180* 198* 183* 218*  BUN 26* 24* 25* 30*  CREATININE 1.21 1.13 1.18 1.14  CALCIUM 9.0 9.3 9.4 9.3  MG 2.0 1.7 1.6* 1.9  PHOS  --  3.1 3.6 3.3   GFR: Estimated Creatinine Clearance: 42.5 mL/min (by C-G formula based on SCr of 1.14 mg/dL). Liver Function Tests: No results for input(s): AST, ALT, ALKPHOS, BILITOT, PROT, ALBUMIN in the last 168 hours. No results for input(s): LIPASE, AMYLASE in the last 168 hours. No results for input(s): AMMONIA in the last 168 hours. Coagulation Profile: Recent Labs  Lab 09/03/20 0422 09/04/20 0422 09/05/20 0403 09/06/20 0250 09/07/20 0357  INR 2.0* 1.8* 2.0* 2.3* 2.3*   Cardiac  Enzymes: No results for input(s): CKTOTAL, CKMB, CKMBINDEX, TROPONINI in the last 168 hours. BNP (last 3 results) No results for input(s): PROBNP in the last 8760 hours. HbA1C: No results for input(s): HGBA1C in the last 72 hours. CBG: Recent Labs  Lab 09/03/20 0029 09/05/20 2217 09/06/20 2136 09/07/20 0615 09/07/20 1218  GLUCAP 174* 267* 386* 218* 298*   Lipid Profile: No results for input(s): CHOL, HDL, LDLCALC, TRIG, CHOLHDL, LDLDIRECT in the last 72 hours. Thyroid Function Tests: No results for input(s): TSH, T4TOTAL, FREET4, T3FREE, THYROIDAB in the last 72 hours. Anemia Panel: No results for input(s): VITAMINB12, FOLATE, FERRITIN, TIBC, IRON, RETICCTPCT in the last 72 hours. Sepsis Labs: No results for input(s): PROCALCITON, LATICACIDVEN in the last 168 hours.  Recent Results (from the past 240 hour(s))   SARS CORONAVIRUS 2 (TAT 6-24 HRS) Nasopharyngeal Nasopharyngeal Swab     Status: None   Collection Time: 08/31/20  2:47 AM   Specimen: Nasopharyngeal Swab  Result Value Ref Range Status   SARS Coronavirus 2 NEGATIVE NEGATIVE Final    Comment: (NOTE) SARS-CoV-2 target nucleic acids are NOT DETECTED.  The SARS-CoV-2 RNA is generally detectable in upper and lower respiratory specimens during the acute phase of infection. Negative results do not preclude SARS-CoV-2 infection, do not rule out co-infections with other pathogens, and should not be used as the sole basis for treatment or other patient management decisions. Negative results must be combined with clinical observations, patient history, and epidemiological information. The expected result is Negative.  Fact Sheet for Patients: HairSlick.no  Fact Sheet for Healthcare Providers: quierodirigir.com  This test is not yet approved or cleared by the [place] FDA and  has been authorized for detection and/or diagnosis of SARS-CoV-2 by FDA under an Emergency Use Authorization (EUA). This EUA will remain  in effect (meaning this test can be used) for the duration of the COVID-19 declaration under Se ction 564(b)(1) of the Act, 21 U.S.C. section 360bbb-3(b)(1), unless the authorization is terminated or revoked sooner.  Performed at [facility], [address], [place], [place] [address]   Culture, Urine     Status: Abnormal   Collection Time: 08/31/20  9:13 AM   Specimen: Urine, Random  Result Value Ref Range Status   Specimen Description URINE, RANDOM  Final   Special Requests NONE  Final   Culture >=100,000 COLONIES/mL PSEUDOMONAS AERUGINOSA (A)  Final   Report Status 09/02/2020 FINAL  Final   Organism ID, Bacteria PSEUDOMONAS AERUGINOSA (A)  Final      Susceptibility   Pseudomonas aeruginosa - MIC*    CEFTAZIDIME 2 SENSITIVE Sensitive     CIPROFLOXACIN  <=0.25 SENSITIVE Sensitive     GENTAMICIN <=1 SENSITIVE Sensitive     IMIPENEM 1 SENSITIVE Sensitive     PIP/TAZO <=4 SENSITIVE Sensitive     CEFEPIME 2 SENSITIVE Sensitive     * >=100,000 COLONIES/mL PSEUDOMONAS AERUGINOSA  SARS CORONAVIRUS 2 (TAT 6-24 HRS) Nasopharyngeal Nasopharyngeal Swab     Status: None   Collection Time: 09/02/20  2:19 PM   Specimen: Nasopharyngeal Swab  Result Value Ref Range Status   SARS Coronavirus 2 NEGATIVE NEGATIVE Final    Comment: (NOTE) SARS-CoV-2 target nucleic acids are NOT DETECTED.  The SARS-CoV-2 RNA is generally detectable in upper and lower respiratory specimens during the acute phase of infection. Negative results do not preclude SARS-CoV-2 infection, do not rule out co-infections with other pathogens, and should not be used as the sole basis for treatment or  other patient management decisions. Negative results must be combined with clinical observations, patient history, and epidemiological information. The expected result is Negative.  Fact Sheet for Patients: HairSlick.nohttps://www.fda.gov/media/138098/download  Fact Sheet for Healthcare Providers: quierodirigir.comhttps://www.fda.gov/media/138095/download  This test is not yet approved or cleared by the [place] FDA and  has been authorized for detection and/or diagnosis of SARS-CoV-2 by FDA under an Emergency Use Authorization (EUA). This EUA will remain  in effect (meaning this test can be used) for the duration of the COVID-19 declaration under Se ction 564(b)(1) of the Act, 21 U.S.C. section 360bbb-3(b)(1), unless the authorization is terminated or revoked sooner.  Performed at [facility], [address] [place], [place] [address]   Resp Panel by RT-PCR (Flu A&B, Covid) Nasopharyngeal Swab     Status: None   Collection Time: 09/07/20 10:35 AM   Specimen: Nasopharyngeal Swab; Nasopharyngeal(NP) swabs in vial transport medium  Result Value Ref Range Status   SARS Coronavirus 2 by RT PCR  NEGATIVE NEGATIVE Final    Comment: (NOTE) SARS-CoV-2 target nucleic acids are NOT DETECTED.  The SARS-CoV-2 RNA is generally detectable in upper respiratory specimens during the acute phase of infection. The lowest concentration of SARS-CoV-2 viral copies this assay can detect is 138 copies/mL. A negative result does not preclude SARS-Cov-2 infection and should not be used as the sole basis for treatment or other patient management decisions. A negative result may occur with  improper specimen collection/handling, submission of specimen other than nasopharyngeal swab, presence of viral mutation(s) within the areas targeted by this assay, and inadequate number of viral copies(<138 copies/mL). A negative result must be combined with clinical observations, patient history, and epidemiological information. The expected result is Negative.  Fact Sheet for Patients:  BloggerCourse.comhttps://www.fda.gov/media/152166/download  Fact Sheet for Healthcare Providers:  SeriousBroker.ithttps://www.fda.gov/media/152162/download  This test is no t yet approved or cleared by the [place] FDA and  has been authorized for detection and/or diagnosis of SARS-CoV-2 by FDA under an Emergency Use Authorization (EUA). This EUA will remain  in effect (meaning this test can be used) for the duration of the COVID-19 declaration under Section 564(b)(1) of the Act, 21 U.S.C.section 360bbb-3(b)(1), unless the authorization is terminated  or revoked sooner.       Influenza A by PCR NEGATIVE NEGATIVE Final   Influenza B by PCR NEGATIVE NEGATIVE Final    Comment: (NOTE) The Xpert Xpress SARS-CoV-2/FLU/RSV plus assay is intended as an aid in the diagnosis of influenza from Nasopharyngeal swab specimens and should not be used as a sole basis for treatment. Nasal washings and aspirates are unacceptable for Xpert Xpress SARS-CoV-2/FLU/RSV testing.  Fact Sheet for Patients: BloggerCourse.comhttps://www.fda.gov/media/152166/download  Fact Sheet for  Healthcare Providers: SeriousBroker.ithttps://www.fda.gov/media/152162/download  This test is not yet approved or cleared by the [place] FDA and has been authorized for detection and/or diagnosis of SARS-CoV-2 by FDA under an Emergency Use Authorization (EUA). This EUA will remain in effect (meaning this test can be used) for the duration of the COVID-19 declaration under Section 564(b)(1) of the Act, 21 U.S.C. section 360bbb-3(b)(1), unless the authorization is terminated or revoked.  Performed at [facility], [address] [place], [place] [address]      Radiology Studies: No results found. Scheduled Meds: . aspirin  81 mg Oral Daily  . cholecalciferol  1,000 Units Oral Daily  . feeding supplement  237 mL Oral BID BM  . fludrocortisone  0.05 mg Oral Daily  . insulin glargine  13 Units Subcutaneous  QHS  . levothyroxine  100 mcg Oral Daily  . mirtazapine  7.5 mg Oral QHS  . multivitamin with minerals  1 tablet Oral Daily  . rosuvastatin  20 mg Oral QHS  . tamsulosin  0.4 mg Oral QPC supper  . warfarin  4 mg Oral Once per day on Sun Mon Wed Fri   And  . warfarin  3 mg Oral Once per day on Tue Thu Sat  . Warfarin - Pharmacist Dosing Inpatient   Does not apply q1600   Continuous Infusions:    LOS: 7 days    Time spent: 25 mins    Walterine Amodei, MD [facility]   If 7PM-7AM, please contact night-coverage

## 2020-09-08 DIAGNOSIS — I8290 Acute embolism and thrombosis of unspecified vein: Secondary | ICD-10-CM | POA: Diagnosis not present

## 2020-09-08 DIAGNOSIS — E46 Unspecified protein-calorie malnutrition: Secondary | ICD-10-CM | POA: Diagnosis not present

## 2020-09-08 DIAGNOSIS — Z950 Presence of cardiac pacemaker: Secondary | ICD-10-CM | POA: Diagnosis not present

## 2020-09-08 DIAGNOSIS — R5381 Other malaise: Secondary | ICD-10-CM | POA: Diagnosis not present

## 2020-09-08 DIAGNOSIS — I6930 Unspecified sequelae of cerebral infarction: Secondary | ICD-10-CM | POA: Diagnosis not present

## 2020-09-08 DIAGNOSIS — G459 Transient cerebral ischemic attack, unspecified: Secondary | ICD-10-CM | POA: Diagnosis not present

## 2020-09-08 DIAGNOSIS — I2510 Atherosclerotic heart disease of native coronary artery without angina pectoris: Secondary | ICD-10-CM | POA: Diagnosis not present

## 2020-09-08 DIAGNOSIS — E1165 Type 2 diabetes mellitus with hyperglycemia: Secondary | ICD-10-CM | POA: Diagnosis not present

## 2020-09-08 DIAGNOSIS — E1169 Type 2 diabetes mellitus with other specified complication: Secondary | ICD-10-CM | POA: Diagnosis not present

## 2020-09-08 DIAGNOSIS — M6281 Muscle weakness (generalized): Secondary | ICD-10-CM | POA: Diagnosis not present

## 2020-09-08 DIAGNOSIS — I63331 Cerebral infarction due to thrombosis of right posterior cerebral artery: Secondary | ICD-10-CM | POA: Diagnosis not present

## 2020-09-08 DIAGNOSIS — I69320 Aphasia following cerebral infarction: Secondary | ICD-10-CM | POA: Diagnosis not present

## 2020-09-08 DIAGNOSIS — I495 Sick sinus syndrome: Secondary | ICD-10-CM | POA: Diagnosis not present

## 2020-09-08 DIAGNOSIS — R634 Abnormal weight loss: Secondary | ICD-10-CM | POA: Diagnosis not present

## 2020-09-08 DIAGNOSIS — Z7401 Bed confinement status: Secondary | ICD-10-CM | POA: Diagnosis not present

## 2020-09-08 DIAGNOSIS — Z7901 Long term (current) use of anticoagulants: Secondary | ICD-10-CM | POA: Diagnosis not present

## 2020-09-08 DIAGNOSIS — M2550 Pain in unspecified joint: Secondary | ICD-10-CM | POA: Diagnosis not present

## 2020-09-08 DIAGNOSIS — R4701 Aphasia: Secondary | ICD-10-CM | POA: Diagnosis not present

## 2020-09-08 DIAGNOSIS — D649 Anemia, unspecified: Secondary | ICD-10-CM | POA: Diagnosis not present

## 2020-09-08 DIAGNOSIS — I69820 Aphasia following other cerebrovascular disease: Secondary | ICD-10-CM | POA: Diagnosis not present

## 2020-09-08 DIAGNOSIS — R41841 Cognitive communication deficit: Secondary | ICD-10-CM | POA: Diagnosis not present

## 2020-09-08 DIAGNOSIS — E43 Unspecified severe protein-calorie malnutrition: Secondary | ICD-10-CM | POA: Diagnosis not present

## 2020-09-08 DIAGNOSIS — R2689 Other abnormalities of gait and mobility: Secondary | ICD-10-CM | POA: Diagnosis not present

## 2020-09-08 DIAGNOSIS — I10 Essential (primary) hypertension: Secondary | ICD-10-CM | POA: Diagnosis not present

## 2020-09-08 DIAGNOSIS — I639 Cerebral infarction, unspecified: Secondary | ICD-10-CM | POA: Diagnosis not present

## 2020-09-08 DIAGNOSIS — E039 Hypothyroidism, unspecified: Secondary | ICD-10-CM | POA: Diagnosis not present

## 2020-09-08 LAB — PROTIME-INR
INR: 2.2 — ABNORMAL HIGH (ref 0.8–1.2)
Prothrombin Time: 23.9 seconds — ABNORMAL HIGH (ref 11.4–15.2)

## 2020-09-08 LAB — CBC
HCT: 29.4 % — ABNORMAL LOW (ref 39.0–52.0)
Hemoglobin: 10.2 g/dL — ABNORMAL LOW (ref 13.0–17.0)
MCH: 33.1 pg (ref 26.0–34.0)
MCHC: 34.7 g/dL (ref 30.0–36.0)
MCV: 95.5 fL (ref 80.0–100.0)
Platelets: 174 10*3/uL (ref 150–400)
RBC: 3.08 MIL/uL — ABNORMAL LOW (ref 4.22–5.81)
RDW: 12.1 % (ref 11.5–15.5)
WBC: 11.7 10*3/uL — ABNORMAL HIGH (ref 4.0–10.5)
nRBC: 0 % (ref 0.0–0.2)

## 2020-09-08 LAB — BASIC METABOLIC PANEL
Anion gap: 7 (ref 5–15)
BUN: 35 mg/dL — ABNORMAL HIGH (ref 8–23)
CO2: 27 mmol/L (ref 22–32)
Calcium: 8.9 mg/dL (ref 8.9–10.3)
Chloride: 95 mmol/L — ABNORMAL LOW (ref 98–111)
Creatinine, Ser: 1.36 mg/dL — ABNORMAL HIGH (ref 0.61–1.24)
GFR, Estimated: 50 mL/min — ABNORMAL LOW (ref >60)
Glucose, Bld: 220 mg/dL — ABNORMAL HIGH (ref 70–99)
Potassium: 4.1 mmol/L (ref 3.5–5.1)
Sodium: 129 mmol/L — ABNORMAL LOW (ref 135–145)

## 2020-09-08 LAB — GLUCOSE, CAPILLARY
Glucose-Capillary: 206 mg/dL — ABNORMAL HIGH (ref 70–99)
Glucose-Capillary: 424 mg/dL — ABNORMAL HIGH (ref 70–99)
Glucose-Capillary: 443 mg/dL — ABNORMAL HIGH (ref 70–99)
Glucose-Capillary: 351 mg/dL — ABNORMAL HIGH (ref 70–99)
Glucose-Capillary: 395 mg/dL — ABNORMAL HIGH (ref 70–99)

## 2020-09-08 LAB — PHOSPHORUS: Phosphorus: 3.6 mg/dL (ref 2.5–4.6)

## 2020-09-08 LAB — MAGNESIUM: Magnesium: 1.7 mg/dL (ref 1.7–2.4)

## 2020-09-08 MED ORDER — FLUDROCORTISONE ACETATE 0.1 MG PO TABS: 0.0500 mg | ORAL_TABLET | Freq: Every day | ORAL | 0 refills | Status: DC

## 2020-09-08 MED ORDER — INSULIN ASPART 100 UNIT/ML ~~LOC~~ SOLN
0.0000 [IU] | Freq: Every day | SUBCUTANEOUS | Status: DC
  Administered 2020-09-08: 5 [IU] via SUBCUTANEOUS

## 2020-09-08 MED ORDER — INSULIN ASPART 100 UNIT/ML ~~LOC~~ SOLN
0.0000 [IU] | Freq: Three times a day (TID) | SUBCUTANEOUS | Status: DC
  Administered 2020-09-08 (×2): 9 [IU] via SUBCUTANEOUS

## 2020-09-08 NOTE — Plan of Care (Signed)
  Problem: Education: Goal: Knowledge of disease or condition will improve Outcome: Adequate for Discharge Goal: Knowledge of secondary prevention will improve Outcome: Adequate for Discharge Goal: Knowledge of patient specific risk factors addressed and post discharge goals established will improve Outcome: Adequate for Discharge Goal: Individualized Educational Video(s) Outcome: Adequate for Discharge   Problem: Coping: Goal: Will verbalize positive feelings about self Outcome: Adequate for Discharge   Problem: Education: Goal: Knowledge of General Education information will improve Description: Including pain rating scale, medication(s)/side effects and non-pharmacologic comfort measures Outcome: Adequate for Discharge   Problem: Health Behavior/Discharge Planning: Goal: Ability to manage health-related needs will improve Outcome: Adequate for Discharge   Problem: Clinical Measurements: Goal: Ability to maintain clinical measurements within normal limits will improve Outcome: Adequate for Discharge Goal: Will remain free from infection Outcome: Adequate for Discharge Goal: Diagnostic test results will improve Outcome: Adequate for Discharge Goal: Respiratory complications will improve Outcome: Adequate for Discharge Goal: Cardiovascular complication will be avoided Outcome: Adequate for Discharge   Problem: Activity: Goal: Risk for activity intolerance will decrease Outcome: Adequate for Discharge   Problem: Nutrition: Goal: Adequate nutrition will be maintained Outcome: Adequate for Discharge   Problem: Coping: Goal: Level of anxiety will decrease Outcome: Adequate for Discharge   Problem: Elimination: Goal: Will not experience complications related to bowel motility Outcome: Adequate for Discharge Goal: Will not experience complications related to urinary retention Outcome: Adequate for Discharge   Problem: Pain Managment: Goal: General experience of comfort  will improve Outcome: Adequate for Discharge   Problem: Safety: Goal: Ability to remain free from injury will improve Outcome: Adequate for Discharge   Problem: Skin Integrity: Goal: Risk for impaired skin integrity will decrease Outcome: Adequate for Discharge   

## 2020-09-08 NOTE — TOC Transition Note (Signed)
Transition of Care [facility]) - CM/SW Discharge Note   Patient Details  Name: Thomas [name] MRN: [id number] Date of Birth: [birth year]  [facility]) CM/SW Contact:  Kermit Balo, RN Phone Number: 09/08/2020, 12:52 PM   Clinical Narrative:    Pt is discharging to [facility]. Pts first choice of [facility] is not in network with his [employer]. Son provided other choices and [facility] selected. Pt to transport via [facility].  Bedside RN aware and d/c packet at the desk.  Room: 126 Number for report: [phone]   Final next level of care: Skilled Nursing Facility Barriers to Discharge: No Barriers Identified   Patient Goals and CMS Choice   CMS Medicare.gov Compare Post Acute Care list provided to:: Patient Represenative (must comment) Choice offered to / list presented to : Spouse,Adult Children  Discharge Placement              Patient chooses bed at: [facility] Patient to be transferred to facility by: [facility] Name of family member notified: Son: Loraine Leriche Barbara Cower) Patient and family notified of of transfer: 09/08/20  Discharge Plan and Services In-house Referral: Clinical Social Work Discharge Planning Services: CM Consult Post Acute Care Choice: Skilled Nursing Facility                               Social Determinants of Health (SDOH) Interventions     Readmission Risk Interventions No flowsheet data found.

## 2020-09-08 NOTE — Discharge Summary (Addendum)
Physician Discharge Summary  Thomas [name] VQM:[id number] DOB: [birth year] DOA: 08/31/2020  PCP: Thomas Barrack, MD  Admit date: 08/31/2020 .  Discharge date: 09/11/2020  Admitted From: Home.  Disposition:  SNF  Recommendations for Outpatient Follow-up:  1. Follow up with PCP in 1-2 weeks. 2. Please obtain BMP/CBC in one week. 3. Advised to follow-up with Neurology ([facility]) in 3 to 4 weeks.   4. Advised to take aspirin and Coumadin.   5. Follow-up in Coumadin clinic as scheduled. 6. Patient is being discharged to skilled nursing facility for rehab. 7. Advised to restart blood pressure medications as blood pressure improves. 8. Neurology suggests Eliquis for anticoagulation since patient had stroke despite being on Coumadin. 9. Decision about anticoagulation should be made by outpatient oncologist and cardiologist. 10. Advised to continue fludrocortisone 0.5 mg daily for orthostatic Hypotension.  Home Health: None, Equipment/Devices: None  Discharge Condition: Stable CODE STATUS:Limited code Diet recommendation: Heart Healthy   Brief [facility] Course: This 87 years old male with PMH significant for osteoarthritis at multiple sites, history of DVT, CAD/CABG x 6, history of angioplasty with stent placement, type 2 diabetes, hyperlipidemia, hypertension, hypothyroidism, chronic kidney disease, hearing impairment, macular degeneration, history of nonhemorrhagic stroke brought in the ED with right-sided weakness along with right sided facial droop and aphasia.  Patient was found to be hypotensive after EMS arrived.  Patient was brought in the ED as stroke code,  his symptoms has improved by the time,  patient arrived in the ED.  CT head showed evolving acute to early subacute right PCA distribution infarct.  MRI was completed,  showed small to moderate sized acute infarction in the inferior right occipital lobe.  No hemorrhage.  Stroke work-up has been  completed.  PT recommended SNF for rehab.  Neurology recommended continue aspirin and Coumadin since patient was on Coumadin before admission.  Neurology recommended Eliquis since patient had a stroke despite being on Coumadin.  Decision about anticoagulation should be made by outpatient oncologist and cardiologist.  Patient's urine culture was positive for Pseudomonas,  Patient has completed 5 days treatment with cefepime.  Patient has significant orthostatic hypotension during physical therapy sessions.  Patient was started on fludrocortisone 0.5 mg daily which improved orthostatic hypotension.  Patient report dizziness has significantly improved,  Patient has participated in therapy.  Patient seems improved and wants to be discharged.  Patient is being discharged to skilled nursing facility.  He was managed for below problems.  Discharge Diagnoses:  Principal Problem:   Acute CVA (cerebrovascular accident) [name]) Active Problems:   Coronary artery disease s/p CABG 2001   Hypothyroidism   Venous thromboembolism (VTE)   Hypertension   Type 2 diabetes mellitus with hyperglycemia ([name])   Dyslipidemia due to type 2 diabetes mellitus ([name])   BPH (benign prostatic hyperplasia)   TIA (transient ischemic attack)   Normocytic anemia   Prolonged QT interval  Bactriuria due to pseudomonas.  Aphasia/ right facial droop secondary to right occipital stroke: Patient presented with right-sided weakness, right facial droop and aphasia. Symptoms resolved by the time patient arrived in the ED. CT head showed evolving acute to early subacute right PCA distribution infarct. Continue telemetry. Frequent neurochecks. PT/OT > skilled nursing facility. Hemoglobin A1c 7.5 Lipid profile shows LDL 37. Echocardiogram showed EF 60 to 65%.  No embolic source. Continue aspirin and warfarin. Patient was on warfarin before admission. MRI: Small to moderate size acute infarction in the inferior right occipital lobe.  No hemorrhage. Neuro hospitalist  team is following.  Patient had a stroke despite being on warfarin. Neurology has recommended switching to Eliquis. Discussion about Eliquis has to be done by outpatient cardiologist and oncologist. Cardiologist recommended to continue Coumadin. Oncologist recommended to switch to Eliquis.  Coronary artery disease s/p CABG 2001 Continue carvedilol, aspirin and statin.  Hypothyroidism Continue levothyroxine 100 mcg p.o. daily.  Venous thromboembolism (VTE) No need to hold [name] per neurology. Continue warfarin per pharmacy.  Hypertension Held meds, resume if  his blood pressure improves. Wife report patient has low blood pressure after he stands up.  Orthostatic hypotension: Patient has significant orthostasis after he stands up. Patient reports having this issue for more than a year. Continue fludrocortisone 0.05 mg daily.   Type 2 diabetes mellitus with hyperglycemia ([name]) Carbohydrate modified diet. Continue Lantus 13 units SQ daily. CBG monitoring with RI SS. Hemoglobin A1c 7.4  Dyslipidemia due to type 2 diabetes mellitus ([name])  Continue rosuvastatin 20 mg p.o. daily.  BPH (benign prostatic hyperplasia) Continue tamsulosin 0.4 mg p.o. daily.  Normocytic anemia Monitor H&H. Hb remains stable above 10.9  Urine culture positive for Pseudomonas: Patient started on Keflex at admission. UA not significant. Urine culture grew 100,000 colonies of Pseudomonas. Completed cefepime for 5 days, last date 3/11.   Discharge Instructions  Discharge Instructions    Call MD for:  difficulty breathing, headache or visual disturbances   Complete by: As directed    Call MD for:  persistant dizziness or light-headedness   Complete by: As directed    Call MD for:  persistant nausea and vomiting   Complete by: As directed    Diet - low sodium heart healthy   Complete by: As directed    Diet Carb Modified   Complete by: As directed     Discharge instructions   Complete by: As directed    Advised to follow-up with primary care physician in 1 week. Advised to follow-up with neurology in 3 to 4 weeks.   Advised to take aspirin and Coumadin.   Follow-up in Coumadin clinic as scheduled. Patient is being discharged to skilled nursing facility for rehab. Advised to restart blood pressure medication and blood pressure improves.   Increase activity slowly   Complete by: As directed      Allergies as of 09/08/2020      Reactions   Amitriptyline    Effexor Xr [venlafaxine]    Lyrica [pregabalin]    Morphine    Morphine And Related    Pravachol [pravastatin]    Zolpidem       Medication List    STOP taking these medications   cephALEXin 500 MG capsule Commonly known as: KEFLEX   chlorthalidone 25 MG tablet Commonly known as: HYGROTON   ramipril 10 MG capsule Commonly known as: ALTACE     TAKE these medications   acetaminophen 325 MG tablet Commonly known as: TYLENOL Take 650 mg by mouth every 6 (six) hours as needed for moderate pain or headache.   aspirin 81 MG chewable tablet Chew 81 mg by mouth daily.   b complex vitamins capsule Take 1 capsule by mouth daily.   carvedilol 12.5 MG tablet Commonly known as: COREG Take 1 tablet (12.5 mg total) by mouth 2 (two) times daily.   cholecalciferol 25 MCG (1000 UNIT) tablet Commonly known as: VITAMIN D3 Take 1,000 Units by mouth daily.   co-enzyme Q-10 50 MG capsule Take 50 mg by mouth daily.   fludrocortisone 0.1 MG tablet Commonly known as:  FLORINEF Take 0.5 tablets (0.05 mg total) by mouth daily.   glucosamine-chondroitin 500-400 MG tablet Take 1 tablet by mouth 3 (three) times daily.   insulin glargine 100 unit/mL Sopn Commonly known as: LANTUS Inject 13 Units into the skin daily. What changed: when to take this   Jantoven 4 MG tablet Generic drug: warfarin Take 4 mg by mouth See admin instructions. Monday,Wednesday,Friday and sunday What  changed: Another medication with the same name was changed. Make sure you understand how and when to take each.   warfarin 3 MG tablet Commonly known as: COUMADIN TAKE 1 TABLET BY MOUTH DAILY ON TUESDAY, THURSDAY AND SATURDAY What changed: See the new instructions.   levothyroxine 100 MCG tablet Commonly known as: SYNTHROID Take 1 tablet (100 mcg total) by mouth daily.   mirtazapine 7.5 MG tablet Commonly known as: REMERON Take 7.5 mg by mouth at bedtime.   multivitamin tablet Take 1 tablet by mouth daily.   RED YEAST RICE PO Take 1 capsule by mouth daily.   rosuvastatin 20 MG tablet Commonly known as: CRESTOR Take 1 tablet (20 mg total) by mouth daily. What changed: when to take this   tamsulosin 0.4 MG Caps capsule Commonly known as: FLOMAX Take 0.4 mg by mouth daily after supper.       Contact information for follow-up providers    Thomas Barrack, MD Follow up in 1 week(s).   Specialty: Family Medicine Contact information: [place] [address] [phone]        [facility] Follow up in 4 week(s).   Contact information: [place] [address] [phone]           Contact information for after-discharge care    Destination    [facility] .   Service: Skilled Nursing Contact information: [address] [address] [phone]                 Allergies  Allergen Reactions  . Amitriptyline   . Effexor Xr [Venlafaxine]   . Lyrica [Pregabalin]   . Morphine   . Morphine And Related   . Pravachol [Pravastatin]   . Zolpidem     Consultations:  Neurology   Procedures/Studies: CT ANGIO HEAD W OR WO CONTRAST  Result Date: 08/31/2020 CLINICAL DATA:  Right-sided deficits and right facial droop. EXAM: CT ANGIOGRAPHY HEAD AND NECK TECHNIQUE: Multidetector CT imaging of the head and neck was performed using the  standard protocol during bolus administration of intravenous contrast. Multiplanar CT image reconstructions and MIPs were obtained to evaluate the vascular anatomy. Carotid stenosis measurements (when applicable) are obtained utilizing NASCET criteria, using the distal internal carotid diameter as the denominator. CONTRAST:  68m OMNIPAQUE IOHEXOL 350 MG/ML SOLN COMPARISON:  Head CT from earlier today FINDINGS: CTA NECK FINDINGS Aortic arch: 2 vessel arch with atheromatous calcification. Right carotid system: Diffuse atheromatous wall thickening with superimposed calcified plaques. Irregular plaque at the left ICA bifurcation which causes 60% stenosis at the ICA origin. No ulceration or dissection seen. Left carotid system: Diffuse atheromatous plaque which is mixed density. No flow limiting stenosis or ulceration. Vertebral arteries: No proximal subclavian stenosis. Diminutive right vertebral artery, likely both congenital and atheromatous, but patent to the dura. Mild atheromatous narrowing at the left vertebral origin. Skeleton: Advanced degenerative facet spurring with multilevel endplate spurring. No acute osseous finding. Other neck: No acute finding. Upper chest: Negative Review of  the MIP images confirms the above findings CTA HEAD FINDINGS Anterior circulation: Diffuse calcified plaque along the carotid siphons. Approximately 50% stenosis of the right ICA at the paraclinoid segment. Diffuse atheromatous irregularity and moderate to advanced narrowing beginning at the M1 segments and affecting the right more than left MCA branches. The ACA distribution is equally affected and there is bilateral severe Peri callosal narrowings Posterior circulation: Calcified plaque on both V4 segments. The non dominant right V4 segment ends in the right PICA. Diffuse atheromatous irregularity of the left vertebral and basilar arteries with a moderate mid basilar stenosis. Fetal type right PCA. Extensive atheromatous  irregularity to bilateral posterior cerebral arteries especially at the P3 segments and beyond. Venous sinuses: Diffusely patent high density left-sided venous system attributed to reflux from the chest. The left brachiocephalic vein is stenotic in the setting of pacer. Anatomic variants: Fetal type right PCA Review of the MIP images confirms the above findings IMPRESSION: 1. No emergent finding. 2. Severe intracranial atherosclerosis with widespread and high-grade stenoses. 3. Atherosclerosis in the neck with up to 60% narrowing at the right ICA origin. 4. Stenotic left brachiocephalic vein in the setting of pacer lead. Electronically Signed   By: Monte Fantasia M.D.   On: 08/31/2020 08:54   CT Head Wo Contrast  Result Date: 08/19/2020 CLINICAL DATA:  Pain following recent fall EXAM: CT HEAD WITHOUT CONTRAST TECHNIQUE: Contiguous axial images were obtained from the base of the skull through the vertex without intravenous contrast. COMPARISON:  None. FINDINGS: Brain: There is mild diffuse atrophy. There is no intracranial mass, hemorrhage, extra-axial fluid collection, or midline shift. There is decreased attenuation throughout the centra semiovale bilaterally. There is evidence of prior small infarcts in each thalamus. Decreased attenuation is noted in the anterior limb of the left external capsule. No acute appearing infarct is appreciable. Vascular: No hyperdense vessels. There is calcification in each distal vertebral artery and carotid siphon region. Skull: Bony calvarium appears intact. Sinuses/Orbits: There is a retention cyst in the inferior right maxillary antrum. There is mucosal thickening in several ethmoid air cells. Orbits appear symmetric bilaterally. Other: Mastoid air cells are clear. IMPRESSION: Atrophy with periventricular small vessel disease. Prior small infarcts in each thalamus as well as in the anterior limb of the left external capsule. No acute infarct is evident. No mass or  hemorrhage. There are foci of arterial vascular calcification. There are foci of paranasal sinus disease. Electronically Signed   By: Lowella Grip III M.D.   On: 08/19/2020 09:39   CT ANGIO NECK W OR WO CONTRAST  Result Date: 08/31/2020 CLINICAL DATA:  Right-sided deficits and right facial droop. EXAM: CT ANGIOGRAPHY HEAD AND NECK TECHNIQUE: Multidetector CT imaging of the head and neck was performed using the standard protocol during bolus administration of intravenous contrast. Multiplanar CT image reconstructions and MIPs were obtained to evaluate the vascular anatomy. Carotid stenosis measurements (when applicable) are obtained utilizing NASCET criteria, using the distal internal carotid diameter as the denominator. CONTRAST:  47mL OMNIPAQUE IOHEXOL 350 MG/ML SOLN COMPARISON:  Head CT from earlier today FINDINGS: CTA NECK FINDINGS Aortic arch: 2 vessel arch with atheromatous calcification. Right carotid system: Diffuse atheromatous wall thickening with superimposed calcified plaques. Irregular plaque at the left ICA bifurcation which causes 60% stenosis at the ICA origin. No ulceration or dissection seen. Left carotid system: Diffuse atheromatous plaque which is mixed density. No flow limiting stenosis or ulceration. Vertebral arteries: No proximal subclavian stenosis. Diminutive right vertebral artery, likely both congenital  and atheromatous, but patent to the dura. Mild atheromatous narrowing at the left vertebral origin. Skeleton: Advanced degenerative facet spurring with multilevel endplate spurring. No acute osseous finding. Other neck: No acute finding. Upper chest: Negative Review of the MIP images confirms the above findings CTA HEAD FINDINGS Anterior circulation: Diffuse calcified plaque along the carotid siphons. Approximately 50% stenosis of the right ICA at the paraclinoid segment. Diffuse atheromatous irregularity and moderate to advanced narrowing beginning at the M1 segments and affecting  the right more than left MCA branches. The ACA distribution is equally affected and there is bilateral severe Peri callosal narrowings Posterior circulation: Calcified plaque on both V4 segments. The non dominant right V4 segment ends in the right PICA. Diffuse atheromatous irregularity of the left vertebral and basilar arteries with a moderate mid basilar stenosis. Fetal type right PCA. Extensive atheromatous irregularity to bilateral posterior cerebral arteries especially at the P3 segments and beyond. Venous sinuses: Diffusely patent high density left-sided venous system attributed to reflux from the chest. The left brachiocephalic vein is stenotic in the setting of pacer. Anatomic variants: Fetal type right PCA Review of the MIP images confirms the above findings IMPRESSION: 1. No emergent finding. 2. Severe intracranial atherosclerosis with widespread and high-grade stenoses. 3. Atherosclerosis in the neck with up to 60% narrowing at the right ICA origin. 4. Stenotic left brachiocephalic vein in the setting of pacer lead. Electronically Signed   By: Monte Fantasia M.D.   On: 08/31/2020 08:54   MR BRAIN WO CONTRAST  Result Date: 09/01/2020 CLINICAL DATA:  Right PCA stroke EXAM: MRI HEAD WITHOUT CONTRAST TECHNIQUE: Multiplanar, multiecho pulse sequences of the brain and surrounding structures were obtained without intravenous contrast. COMPARISON:  08/19/2020 FINDINGS: Brain: Small to moderate size area restricted diffusion within the inferior right occipital lobe. No evidence of hemorrhage. There is no intracranial mass or significant mass effect. Prominence of the ventricles and sulci reflects stable parenchymal volume loss. Confluent areas of T2 hyperintensity in the supratentorial greater than pontine white matter are nonspecific but probably reflects stable advanced chronic microvascular ischemic changes. Several chronic small vessel infarcts are again identified including involvement of thalamus, basal  ganglia, and cerebellum bilaterally. Small chronic lateral right temporal infarct. Vascular: Major vessel flow voids at the skull base are preserved. Skull and upper cervical spine: Normal marrow signal is preserved. Sinuses/Orbits: Minor mucosal thickening. Bilateral lens replacements. Other: Sella is unremarkable.  Mastoid air cells are clear. IMPRESSION: Small to moderate size acute infarction of the inferior right occipital lobe. No hemorrhage. Stable chronic findings detailed above with no additional change since 08/19/2020. Electronically Signed   By: Macy Mis M.D.   On: 09/01/2020 11:49   MR BRAIN WO CONTRAST  Result Date: 08/19/2020 CLINICAL DATA:  Transient ischemic attack. Fall at home. Orthostatic. EXAM: MRI HEAD WITHOUT CONTRAST TECHNIQUE: Multiplanar, multiecho pulse sequences of the brain and surrounding structures were obtained without intravenous contrast. COMPARISON:  Same day head CT. FINDINGS: Brain: No evidence of acute infarct. Remote infarct in the lateral right temporal lobe. Additional remote lacunar infarcts involving the bilateral thalami and bilateral cerebellar hemispheres. Additional small T2 hyperintensities in the basal ganglia most likely represent dilated perivascular spaces. There is advanced/confluent T2/FLAIR hyperintensities within the white matter and pons, most likely related to chronic microvascular ischemic disease. Punctate area of susceptibility artifact in the right cerebellar hemisphere, likely related to prior microhemorrhage. Mild generalized cerebral volume loss. No hydrocephalus. Cavum septum pellucidum et vergae, anatomic variant. No acute hemorrhage. No mass  lesion or abnormal mass effect. No extra-axial fluid collection. Vascular: Major arterial flow voids are maintained at the skull base. Skull and upper cervical spine: Normal marrow signal. Partially imaged upper cervical spine degenerative change. Sinuses/Orbits: Right maxillary sinus retention cyst  versus polyp. Scattered ethmoid air cell mucosal thickening without air-fluid levels. Unremarkable orbits. Other: No sizable mastoid effusions. IMPRESSION: 1. No evidence of acute intracranial abnormality.  No acute infarct. 2. Remote right temporal lobe and bilateral basal ganglia and cerebellar infarcts. 3. Advanced chronic microvascular ischemic disease. Electronically Signed   By: Margaretha Sheffield MD   On: 08/19/2020 15:16   CT ABDOMEN PELVIS W CONTRAST  Result Date: 08/19/2020 CLINICAL DATA:  Fall. EXAM: CT ABDOMEN AND PELVIS WITH CONTRAST TECHNIQUE: Multidetector CT imaging of the abdomen and pelvis was performed using the standard protocol following bolus administration of intravenous contrast. CONTRAST:  113m OMNIPAQUE IOHEXOL 300 MG/ML  SOLN COMPARISON:  None. FINDINGS: Lower chest: Visualized lung bases are unremarkable. Moderate size sliding-type hiatal hernia is noted. Hepatobiliary: No focal liver abnormality is seen. No gallstones, gallbladder wall thickening, or biliary dilatation. Pancreas: Unremarkable. No pancreatic ductal dilatation or surrounding inflammatory changes. Spleen: Normal in size without focal abnormality. Adrenals/Urinary Tract: Adrenal glands are unremarkable. Kidneys are normal, without renal calculi, focal lesion, or hydronephrosis. Bladder is unremarkable. Stomach/Bowel: There is no evidence of bowel obstruction or inflammation. Diverticulosis of descending and sigmoid colon is noted without inflammation. The appendix is not visualized. Vascular/Lymphatic: Aortic atherosclerosis. No enlarged abdominal or pelvic lymph nodes. Reproductive: Prostate is unremarkable. Other: Small fat containing periumbilical hernia is noted. No ascites is noted. Musculoskeletal: Multiple small lucencies are noted throughout the pelvis and visualized proximal femurs concerning for possible lytic lesions related to multiple myeloma or metastatic disease. IMPRESSION: 1. Moderate size sliding-type  hiatal hernia. 2. Diverticulosis of descending and sigmoid colon is noted without inflammation. 3. Small fat containing periumbilical hernia. 4. Multiple small lucencies are noted throughout the pelvis and visualized proximal femurs concerning for possible lytic lesions related to multiple myeloma or metastatic disease. MRI may be performed for further evaluation. Aortic Atherosclerosis (ICD10-I70.0). Electronically Signed   By: JMarijo ConceptionM.D.   On: 08/19/2020 09:42   DG Chest Port 1 View  Result Date: 08/19/2020 CLINICAL DATA:  Pacemaker. EXAM: PORTABLE CHEST 1 VIEW COMPARISON:  None. FINDINGS: The heart size and mediastinal contours are within normal limits. Both lungs are clear. Status post coronary bypass graft. Left-sided pacemaker is noted with leads in grossly good position. No pneumothorax or pleural effusion is noted. Old left rib fractures are noted. IMPRESSION: No active disease. Electronically Signed   By: JMarijo ConceptionM.D.   On: 08/19/2020 13:56   CT HEAD CODE STROKE WO CONTRAST  Result Date: 08/31/2020 CLINICAL DATA:  Code stroke. Initial evaluation for acute right-sided deficits, right facial droop. EXAM: CT HEAD WITHOUT CONTRAST TECHNIQUE: Contiguous axial images were obtained from the base of the skull through the vertex without intravenous contrast. COMPARISON:  Previous MRI from 08/19/2020 FINDINGS: Brain: Generalized age-related cerebral atrophy with advanced chronic microvascular ischemic disease. Associated remote lacunar infarcts noted within the bilateral thalami. Additional small remote right temporal lobe infarct. There is new wedge-shaped parenchymal hypodensity involving the right occipital pole, consistent with an evolving acute to early subacute right PCA territory infarct. No associated hemorrhage or mass effect. No other visible acute large vessel territory infarct. No intracranial hemorrhage elsewhere within the brain. No mass lesion, midline shift or mass effect. No  hydrocephalus or  extra-axial fluid collection. Vascular: No hyperdense vessel. Calcified atherosclerosis present at skull base. Skull: Scalp soft tissues and calvarium within normal limits. Sinuses/Orbits: Globes and orbital soft tissues demonstrate no acute finding. Scattered mucosal thickening noted within the ethmoidal air cells. Small right maxillary sinus retention cyst. No mastoid effusion. Other: None. ASPECTS [facility] Stroke Program Early CT Score) - Ganglionic level infarction (caudate, lentiform nuclei, internal capsule, insula, M1-M3 cortex): 7 - Supraganglionic infarction (M4-M6 cortex): 3 Total score (0-10 with 10 being normal): 10 IMPRESSION: 1. Evolving acute to early subacute right PCA distribution infarct, new as compared to recent MRI from 08/19/2020. No associated hemorrhage or mass effect. 2. No other acute intracranial abnormality. 3. ASPECTS is 10. 4. Underlying age-related cerebral atrophy with advanced chronic microvascular ischemic disease, with additional chronic right temporal lobe infarct. These results were communicated to Dr. Theda Sers at 1:18 amon 3/6/2022by text page via the [facility] messaging system. Electronically Signed   By: Jeannine Boga M.D.   On: 08/31/2020 01:21   Echocardiogram, CT head, MRI.   Subjective: Patient was seen and examined at bedside.  Overnight events noted.  Patient has participated in physical therapy.  Patient report dizziness has significantly improved after he stands up.  Orthostatic hypotension has significantly improved.  Patient feels better and want to be discharged.  Patient is being discharged to skilled nursing facility.  Discharge Exam: Vitals:   09/08/20 1500 09/08/20 1959  BP: 136/73 140/70  Pulse: 76 84  Resp: 16 18  Temp: 98.2 F (36.8 C) 98.7 F (37.1 C)  SpO2: 97% 95%   Vitals:   09/08/20 0700 09/08/20 1100 09/08/20 1500 09/08/20 1959  BP: (!) 159/67 (!) 148/74 136/73 140/70  Pulse: 76 91 76 84  Resp: _0 Temp: 98.6 F (37 C) 97.9 F (36.6 C) 98.2 F (36.8 C) 98.7 F (37.1 C)  TempSrc: Oral Oral Oral Oral  SpO2: 98% 100% 97% 95%  Weight:      Height:        General: Pt is alert, awake, not in acute distress Cardiovascular: RRR, S1/S2 +, no rubs, no gallops Respiratory: CTA bilaterally, no wheezing, no rhonchi Abdominal: Soft, NT, ND, bowel sounds + Extremities: no edema, no cyanosis    The results of significant diagnostics from this hospitalization (including imaging, microbiology, ancillary and laboratory) are listed below for reference.     Microbiology: Recent Results (from the past 240 hour(s))  SARS CORONAVIRUS 2 (TAT 6-24 HRS) Nasopharyngeal Nasopharyngeal Swab     Status: None   Collection Time: 09/02/20  2:19 PM   Specimen: Nasopharyngeal Swab  Result Value Ref Range Status   SARS Coronavirus 2 NEGATIVE NEGATIVE Final    Comment: (NOTE) SARS-CoV-2 target nucleic acids are NOT DETECTED.  The SARS-CoV-2 RNA is generally detectable in upper and lower respiratory specimens during the acute phase of infection. Negative results do not preclude SARS-CoV-2 infection, do not rule out co-infections with other pathogens, and should not be used as the sole basis for treatment or other patient management decisions. Negative results must be combined with clinical observations, patient history, and epidemiological information. The expected result is Negative.  Fact Sheet for Patients: SugarRoll.be  Fact Sheet for Healthcare Providers: https://www.woods-mathews.com/  This test is not yet approved or cleared by the [place] FDA and  has been authorized for detection and/or diagnosis of SARS-CoV-2 by FDA under an Emergency Use Authorization (EUA). This EUA will remain  in effect (meaning this test can be used)  for the duration of the COVID-19 declaration under Se ction 564(b)(1) of the Act, 21 U.S.C. section 360bbb-3(b)(1),  unless the authorization is terminated or revoked sooner.  Performed at [facility], [place] [address] [place], [place] [address]   Resp Panel by RT-PCR (Flu A&B, Covid) Nasopharyngeal Swab     Status: None   Collection Time: 09/07/20 10:35 AM   Specimen: Nasopharyngeal Swab; Nasopharyngeal(NP) swabs in vial transport medium  Result Value Ref Range Status   SARS Coronavirus 2 by RT PCR NEGATIVE NEGATIVE Final    Comment: (NOTE) SARS-CoV-2 target nucleic acids are NOT DETECTED.  The SARS-CoV-2 RNA is generally detectable in upper respiratory specimens during the acute phase of infection. The lowest concentration of SARS-CoV-2 viral copies this assay can detect is 138 copies/mL. A negative result does not preclude SARS-Cov-2 infection and should not be used as the sole basis for treatment or other patient management decisions. A negative result may occur with  improper specimen collection/handling, submission of specimen other than nasopharyngeal swab, presence of viral mutation(s) within the areas targeted by this assay, and inadequate number of viral copies(<138 copies/mL). A negative result must be combined with clinical observations, patient history, and epidemiological information. The expected result is Negative.  Fact Sheet for Patients:  EntrepreneurPulse.com.au  Fact Sheet for Healthcare Providers:  IncredibleEmployment.be  This test is no t yet approved or cleared by the [place] FDA and  has been authorized for detection and/or diagnosis of SARS-CoV-2 by FDA under an Emergency Use Authorization (EUA). This EUA will remain  in effect (meaning this test can be used) for the duration of the COVID-19 declaration under Section 564(b)(1) of the Act, 21 U.S.C.section 360bbb-3(b)(1), unless the authorization is terminated  or revoked sooner.       Influenza A by PCR NEGATIVE NEGATIVE Final   Influenza B by PCR NEGATIVE NEGATIVE  Final    Comment: (NOTE) The Xpert Xpress SARS-CoV-2/FLU/RSV plus assay is intended as an aid in the diagnosis of influenza from Nasopharyngeal swab specimens and should not be used as a sole basis for treatment. Nasal washings and aspirates are unacceptable for Xpert Xpress SARS-CoV-2/FLU/RSV testing.  Fact Sheet for Patients: EntrepreneurPulse.com.au  Fact Sheet for Healthcare Providers: IncredibleEmployment.be  This test is not yet approved or cleared by the [place] FDA and has been authorized for detection and/or diagnosis of SARS-CoV-2 by FDA under an Emergency Use Authorization (EUA). This EUA will remain in effect (meaning this test can be used) for the duration of the COVID-19 declaration under Section 564(b)(1) of the Act, 21 U.S.C. section 360bbb-3(b)(1), unless the authorization is terminated or revoked.  Performed at [facility], [place] [address], [place], [place] [address]      Labs: BNP (last 3 results) No results for input(s): BNP in the last 8760 hours. Basic Metabolic Panel: Recent Labs  Lab 09/05/20 0403 09/06/20 0250 09/08/20 0222  NA 135 133* 129*  K 4.3 4.5 4.1  CL 101 99 95*  CO2 _0 GLUCOSE 183* 218* 220*  BUN 25* 30* 35*  CREATININE 1.18 1.14 1.36*  CALCIUM 9.4 9.3 8.9  MG 1.6* 1.9 1.7  PHOS 3.6 3.3 3.6   Liver Function Tests: No results for input(s): AST, ALT, ALKPHOS, BILITOT, PROT, ALBUMIN in the last 168 hours. No results for input(s): LIPASE, AMYLASE in the last 168 hours. No results for input(s): AMMONIA in the last 168 hours. CBC: Recent Labs  Lab 09/05/20 0403 09/08/20 0222  WBC 11.4* 11.7*  HGB 10.9* 10.2*  HCT 31.3* 29.4*  MCV 96.3 95.5  PLT 179 174   Cardiac Enzymes: No results for input(s): CKTOTAL, CKMB, CKMBINDEX, TROPONINI in the last 168 hours. BNP: Invalid input(s): POCBNP CBG: Recent Labs  Lab 09/08/20 0643 09/08/20 1144 09/08/20 1147 09/08/20 1553  09/08/20 2109  GLUCAP 206* 443* 424* 351* 395*   D-Dimer No results for input(s): DDIMER in the last 72 hours. Hgb A1c No results for input(s): HGBA1C in the last 72 hours. Lipid Profile No results for input(s): CHOL, HDL, LDLCALC, TRIG, CHOLHDL, LDLDIRECT in the last 72 hours. Thyroid function studies No results for input(s): TSH, T4TOTAL, T3FREE, THYROIDAB in the last 72 hours.  Invalid input(s): FREET3 Anemia work up No results for input(s): VITAMINB12, FOLATE, FERRITIN, TIBC, IRON, RETICCTPCT in the last 72 hours. Urinalysis    Component Value Date/Time   COLORURINE YELLOW 08/31/2020 0913   APPEARANCEUR CLEAR 08/31/2020 0913   LABSPEC 1.016 08/31/2020 0913   PHURINE 5.0 08/31/2020 0913   GLUCOSEU 50 (A) 08/31/2020 0913   GLUCOSEU 100 (A) 08/22/2020 1122   HGBUR NEGATIVE 08/31/2020 0913   BILIRUBINUR NEGATIVE 08/31/2020 0913   KETONESUR NEGATIVE 08/31/2020 0913   PROTEINUR NEGATIVE 08/31/2020 0913   UROBILINOGEN 0.2 08/22/2020 1122   NITRITE NEGATIVE 08/31/2020 0913   LEUKOCYTESUR NEGATIVE 08/31/2020 0913   Sepsis Labs Invalid input(s): PROCALCITONIN,  WBC,  LACTICIDVEN Microbiology Recent Results (from the past 240 hour(s))  SARS CORONAVIRUS 2 (TAT 6-24 HRS) Nasopharyngeal Nasopharyngeal Swab     Status: None   Collection Time: 09/02/20  2:19 PM   Specimen: Nasopharyngeal Swab  Result Value Ref Range Status   SARS Coronavirus 2 NEGATIVE NEGATIVE Final    Comment: (NOTE) SARS-CoV-2 target nucleic acids are NOT DETECTED.  The SARS-CoV-2 RNA is generally detectable in upper and lower respiratory specimens during the acute phase of infection. Negative results do not preclude SARS-CoV-2 infection, do not rule out co-infections with other pathogens, and should not be used as the sole basis for treatment or other patient management decisions. Negative results must be combined with clinical observations, patient history, and epidemiological information. The  expected result is Negative.  Fact Sheet for Patients: SugarRoll.be  Fact Sheet for Healthcare Providers: https://www.woods-mathews.com/  This test is not yet approved or cleared by the [place] FDA and  has been authorized for detection and/or diagnosis of SARS-CoV-2 by FDA under an Emergency Use Authorization (EUA). This EUA will remain  in effect (meaning this test can be used) for the duration of the COVID-19 declaration under Se ction 564(b)(1) of the Act, 21 U.S.C. section 360bbb-3(b)(1), unless the authorization is terminated or revoked sooner.  Performed at [facility], [place] [address], [place], [place] [address]   Resp Panel by RT-PCR (Flu A&B, Covid) Nasopharyngeal Swab     Status: None   Collection Time: 09/07/20 10:35 AM   Specimen: Nasopharyngeal Swab; Nasopharyngeal(NP) swabs in vial transport medium  Result Value Ref Range Status   SARS Coronavirus 2 by RT PCR NEGATIVE NEGATIVE Final    Comment: (NOTE) SARS-CoV-2 target nucleic acids are NOT DETECTED.  The SARS-CoV-2 RNA is generally detectable in upper respiratory specimens during the acute phase of infection. The lowest concentration of SARS-CoV-2 viral copies this assay can detect is 138 copies/mL. A negative result does not preclude SARS-Cov-2 infection and should not be used as the sole basis for treatment or other patient management decisions. A negative result may occur with  improper specimen  collection/handling, submission of specimen other than nasopharyngeal swab, presence of viral mutation(s) within the areas targeted by this assay, and inadequate number of viral copies(<138 copies/mL). A negative result must be combined with clinical observations, patient history, and epidemiological information. The expected result is Negative.  Fact Sheet for Patients:  EntrepreneurPulse.com.au  Fact Sheet for Healthcare Providers:   IncredibleEmployment.be  This test is no t yet approved or cleared by the [place] FDA and  has been authorized for detection and/or diagnosis of SARS-CoV-2 by FDA under an Emergency Use Authorization (EUA). This EUA will remain  in effect (meaning this test can be used) for the duration of the COVID-19 declaration under Section 564(b)(1) of the Act, 21 U.S.C.section 360bbb-3(b)(1), unless the authorization is terminated  or revoked sooner.       Influenza A by PCR NEGATIVE NEGATIVE Final   Influenza B by PCR NEGATIVE NEGATIVE Final    Comment: (NOTE) The Xpert Xpress SARS-CoV-2/FLU/RSV plus assay is intended as an aid in the diagnosis of influenza from Nasopharyngeal swab specimens and should not be used as a sole basis for treatment. Nasal washings and aspirates are unacceptable for Xpert Xpress SARS-CoV-2/FLU/RSV testing.  Fact Sheet for Patients: EntrepreneurPulse.com.au  Fact Sheet for Healthcare Providers: IncredibleEmployment.be  This test is not yet approved or cleared by the [place] FDA and has been authorized for detection and/or diagnosis of SARS-CoV-2 by FDA under an Emergency Use Authorization (EUA). This EUA will remain in effect (meaning this test can be used) for the duration of the COVID-19 declaration under Section 564(b)(1) of the Act, 21 U.S.C. section 360bbb-3(b)(1), unless the authorization is terminated or revoked.  Performed at [facility], [place] [address], [place], [place] [address]      Time coordinating discharge: Over 30 minutes  SIGNED:   Shawna Clamp, MD  [facility] 09/11/2020, 9:30 AM Pager   If 7PM-7AM, please contact night-coverage [email or web address]

## 2020-09-08 NOTE — Progress Notes (Signed)
Physical Therapy Treatment Patient Details Name: Thomas [name] MRN: [id number] DOB: Nov 17, 1928 Today's Date: 09/08/2020    History of Present Illness Pt is an 87 y/o male admitted 3/6 secondary to worsening R sided weakness. Workup pending, but CT showed acute to early subacute right PCA distribution. PMH includes CVA, CAD s/p CABG, HTN, macular degeneration, VTE, and DM.    PT Comments    Pt progressing slowly towards physical therapy goals. Orthostatics appear improved this session, although full set of readings not able to be gathered as pt already sitting up in bed for breakfast, and was not able to keep LUE still enough for accurate reading in standing. See below for readings we were able to get.  Pt continues to be limited by dizziness and weakness. R lateral lean appears to be stronger this session and max assist provided at times to prevent full fall to the R both in sitting and standing. He stated at end of session "I'm not going to make it anyway, so I don't know why any of this matters." Emotional support provided. Will continue to follow and progress as able per POC.   Sitting up in bed at beginning of session: 120/67 Sitting EOB after unsuccessful attempt at reading in standing: 124/62 Sitting in chair at end of session: 117/70  Pt reports improvement in overall dizziness by end of session and was left with needs met and set up with breakfast tray.    Follow Up Recommendations  Supervision/Assistance - 24 hour;SNF     Equipment Recommendations  None recommended by PT    Recommendations for Other Services       Precautions / Restrictions Precautions Precautions: Fall;Other (comment) Precaution Comments: Watch BP - orthostatic Restrictions Weight Bearing Restrictions: No    Mobility  Bed Mobility Overal bed mobility: Needs Assistance Bed Mobility: Supine to Sit     Supine to sit: Min assist;Mod assist     General bed mobility comments: Min assist for trunk  elevation to full sitting position with mod assist to maintain midline.    Transfers Overall transfer level: Needs assistance Equipment used: Rolling walker (2 wheeled);2 person hand held assist Transfers: Sit to/from [facility] Sit to Stand: Mod assist;+2 safety/equipment;Max assist Stand pivot transfers: Mod assist;+2 physical assistance;+2 safety/equipment       General transfer comment: Initially stood with RW for standing BP reading. Pt unable to keep LUE still enough for accurate measure. Took twice in standing, with pt heavily leaning to the R. Max assist provided by end of standing trial to keep pt from completely losing balance to the R. +2 assist provided for pivotal steps around to the chair with gait belt and pad under hips for added safety/support.  Ambulation/Gait             General Gait Details: Unable   Stairs             Wheelchair Mobility    Modified Rankin (Stroke Patients Only) Modified Rankin (Stroke Patients Only) Pre-Morbid Rankin Score: No symptoms Modified Rankin: Moderately severe disability     Balance Overall balance assessment: Needs assistance Sitting-balance support: Feet supported;No upper extremity supported Sitting balance-Leahy Scale: Fair     Standing balance support: Bilateral upper extremity supported;During functional activity Standing balance-Leahy Scale: Zero Standing balance comment: reliant on B UE support and external assist. Max assist at times.  Cognition Arousal/Alertness: Awake/alert Behavior During Therapy: WFL for tasks assessed/performed Overall Cognitive Status: Impaired/Different from baseline Area of Impairment: Problem solving                             Problem Solving: Slow processing;Requires verbal cues        Exercises      General Comments        Pertinent Vitals/Pain Pain Assessment: No/denies pain    Home Living                       Prior Function            PT Goals (current goals can now be found in the care plan section) Acute Rehab PT Goals Patient Stated Goal: Get to rehab PT Goal Formulation: With patient Time For Goal Achievement: 09/14/20 Potential to Achieve Goals: Good Progress towards PT goals: Progressing toward goals    Frequency    Min 3X/week      PT Plan Current plan remains appropriate    Co-evaluation              AM-PAC PT "6 Clicks" Mobility   Outcome Measure  Help needed turning from your back to your side while in a flat bed without using bedrails?: A Little Help needed moving from lying on your back to sitting on the side of a flat bed without using bedrails?: A Little Help needed moving to and from a bed to a chair (including a wheelchair)?: A Little Help needed standing up from a chair using your arms (e.g., wheelchair or bedside chair)?: A Lot Help needed to walk in hospital room?: A Lot Help needed climbing 3-5 steps with a railing? : A Lot 6 Click Score: 15    End of Session Equipment Utilized During Treatment: Gait belt Activity Tolerance: Patient tolerated treatment well Patient left: in chair;with call bell/phone within reach;with chair alarm set Nurse Communication: Mobility status PT Visit Diagnosis: Unsteadiness on feet (R26.81);Muscle weakness (generalized) (M62.81)     Time: 8206-0156 PT Time Calculation (min) (ACUTE ONLY): 30 min  Charges:  $Gait Training: 8-22 mins $Therapeutic Activity: 8-22 mins                     Rolinda Roan, PT, DPT Acute Rehabilitation Services Pager: [phone] Office: [phone]    Thelma Comp 09/08/2020, 9:17 AM

## 2020-09-08 NOTE — Progress Notes (Signed)
ANTICOAGULATION CONSULT NOTE - Follow-Up Consult  Pharmacy Consult for Warfarin Indication: Hx DVT, CVA   Patient Measurements: Height: 5\' 8"  (172.7 cm) Weight: 77.7 kg (171 lb 4.8 oz) IBW/kg (Calculated) : 68.4   Vital Signs: Temp: 98.6 F (37 C) (03/14 0700) Temp Source: Oral (03/14 0700) BP: 159/67 (03/14 0700) Pulse Rate: 76 (03/14 0700)  Labs: Recent Labs    09/06/20 0250 09/07/20 0357 09/08/20 0222  HGB  --   --  10.2*  HCT  --   --  29.4*  PLT  --   --  174  LABPROT 24.3* 24.3* 23.9*  INR 2.3* 2.3* 2.2*  CREATININE 1.14  --  1.36*    Estimated Creatinine Clearance: 35.6 mL/min (A) (by C-G formula based on SCr of 1.36 mg/dL (H)).   Assessment: 89 YOM on warfarin PTA for hx DVT who presented on 3/6 with new CVA despite therapeutic INR. Decision to continue warfarin (cards) vs switch to DOAC (neuro + onc) being discussed with the patient's care team. Pharmacy consulted to continue with warfarin dosing at this time.  INR today remains therapeutic (INR 2.2 << 2.3, goal of 2-3). CBC stable - no bleeding noted.   PTA dose of coumadin 3mg  po q T,Th,Sat and 4mg  MWFSu.   Goal of Therapy:  INR 2-3 Monitor platelets by anticoagulation protocol: Yes   Plan:  - Cont Warfarin 4 mg qday except 3mg  TTS - Will adjust to MWF PT/INR checks, CBC q72h - Will continue to monitor for any signs/symptoms of bleeding    Thank you for allowing pharmacy to be a part of this patient's care.  09/10/20, PharmD, BCPS Clinical Pharmacist Clinical phone for 09/08/2020: 09/08/2020 10:10 AM   **Pharmacist phone directory can now be found on [email or web address] ([email or web address]).  Listed under [facility].

## 2020-09-08 NOTE — Discharge Instructions (Signed)
Advised to follow-up with primary care physician in 1 week. Advised to follow-up with neurology in 3 to 4 weeks.   Advised to take aspirin and Coumadin.   Follow-up in Coumadin clinic as scheduled. Patient is being discharged to skilled nursing facility for rehab. Advised to restart blood pressure medication and blood pressure improves.

## 2020-09-08 NOTE — Progress Notes (Signed)
Transport arrived to pick up pt @ 22:25. IV removed on dayshift, paperwork given to transport. Pt D/C to facility.

## 2020-09-11 DIAGNOSIS — Z7901 Long term (current) use of anticoagulants: Secondary | ICD-10-CM | POA: Diagnosis not present

## 2020-09-11 DIAGNOSIS — R634 Abnormal weight loss: Secondary | ICD-10-CM | POA: Diagnosis not present

## 2020-09-11 DIAGNOSIS — E46 Unspecified protein-calorie malnutrition: Secondary | ICD-10-CM | POA: Diagnosis not present

## 2020-09-11 DIAGNOSIS — I6930 Unspecified sequelae of cerebral infarction: Secondary | ICD-10-CM | POA: Diagnosis not present

## 2020-09-18 DIAGNOSIS — R4701 Aphasia: Secondary | ICD-10-CM | POA: Diagnosis not present

## 2020-09-18 DIAGNOSIS — E1169 Type 2 diabetes mellitus with other specified complication: Secondary | ICD-10-CM | POA: Diagnosis not present

## 2020-09-18 DIAGNOSIS — I10 Essential (primary) hypertension: Secondary | ICD-10-CM | POA: Diagnosis not present

## 2020-09-18 DIAGNOSIS — I8290 Acute embolism and thrombosis of unspecified vein: Secondary | ICD-10-CM | POA: Diagnosis not present

## 2020-09-19 DIAGNOSIS — M6281 Muscle weakness (generalized): Secondary | ICD-10-CM | POA: Diagnosis not present

## 2020-09-19 DIAGNOSIS — I6930 Unspecified sequelae of cerebral infarction: Secondary | ICD-10-CM | POA: Diagnosis not present

## 2020-09-19 DIAGNOSIS — E039 Hypothyroidism, unspecified: Secondary | ICD-10-CM | POA: Diagnosis not present

## 2020-09-19 DIAGNOSIS — E1165 Type 2 diabetes mellitus with hyperglycemia: Secondary | ICD-10-CM | POA: Diagnosis not present

## 2020-09-23 ENCOUNTER — Other Ambulatory Visit: Payer: Self-pay | Admitting: Hematology and Oncology

## 2020-09-23 DIAGNOSIS — M898X5 Other specified disorders of bone, thigh: Secondary | ICD-10-CM

## 2020-09-24 DIAGNOSIS — E785 Hyperlipidemia, unspecified: Secondary | ICD-10-CM | POA: Diagnosis not present

## 2020-09-24 DIAGNOSIS — H9190 Unspecified hearing loss, unspecified ear: Secondary | ICD-10-CM | POA: Diagnosis not present

## 2020-09-24 DIAGNOSIS — I1310 Hypertensive heart and chronic kidney disease without heart failure, with stage 1 through stage 4 chronic kidney disease, or unspecified chronic kidney disease: Secondary | ICD-10-CM | POA: Diagnosis not present

## 2020-09-24 DIAGNOSIS — E1165 Type 2 diabetes mellitus with hyperglycemia: Secondary | ICD-10-CM | POA: Diagnosis not present

## 2020-09-24 DIAGNOSIS — N400 Enlarged prostate without lower urinary tract symptoms: Secondary | ICD-10-CM | POA: Diagnosis not present

## 2020-09-24 DIAGNOSIS — I951 Orthostatic hypotension: Secondary | ICD-10-CM | POA: Diagnosis not present

## 2020-09-24 DIAGNOSIS — I69392 Facial weakness following cerebral infarction: Secondary | ICD-10-CM | POA: Diagnosis not present

## 2020-09-24 DIAGNOSIS — D631 Anemia in chronic kidney disease: Secondary | ICD-10-CM | POA: Diagnosis not present

## 2020-09-24 DIAGNOSIS — I2510 Atherosclerotic heart disease of native coronary artery without angina pectoris: Secondary | ICD-10-CM | POA: Diagnosis not present

## 2020-09-24 DIAGNOSIS — I69351 Hemiplegia and hemiparesis following cerebral infarction affecting right dominant side: Secondary | ICD-10-CM | POA: Diagnosis not present

## 2020-09-24 DIAGNOSIS — M150 Primary generalized (osteo)arthritis: Secondary | ICD-10-CM | POA: Diagnosis not present

## 2020-09-24 DIAGNOSIS — N189 Chronic kidney disease, unspecified: Secondary | ICD-10-CM | POA: Diagnosis not present

## 2020-09-24 DIAGNOSIS — E039 Hypothyroidism, unspecified: Secondary | ICD-10-CM | POA: Diagnosis not present

## 2020-09-24 DIAGNOSIS — I69320 Aphasia following cerebral infarction: Secondary | ICD-10-CM | POA: Diagnosis not present

## 2020-09-24 DIAGNOSIS — E1169 Type 2 diabetes mellitus with other specified complication: Secondary | ICD-10-CM | POA: Diagnosis not present

## 2020-09-24 DIAGNOSIS — E1122 Type 2 diabetes mellitus with diabetic chronic kidney disease: Secondary | ICD-10-CM | POA: Diagnosis not present

## 2020-09-25 DIAGNOSIS — M6281 Muscle weakness (generalized): Secondary | ICD-10-CM | POA: Diagnosis not present

## 2020-09-25 DIAGNOSIS — I63331 Cerebral infarction due to thrombosis of right posterior cerebral artery: Secondary | ICD-10-CM | POA: Diagnosis not present

## 2020-09-26 ENCOUNTER — Telehealth: Payer: Self-pay

## 2020-09-26 NOTE — Telephone Encounter (Signed)
Ok with me. Please place any necessary orders. 

## 2020-09-26 NOTE — Telephone Encounter (Signed)
Please advise 

## 2020-09-26 NOTE — Telephone Encounter (Signed)
Home Health Verbal Orders  Agency:  [facility]   Requesting:Ulcer prevention, pain control, ADL, transfers, and exercise    Frequency: 2x1 1x2 0x1 1x2

## 2020-09-26 NOTE — Telephone Encounter (Signed)
LVM with VO

## 2020-10-01 ENCOUNTER — Encounter: Payer: Self-pay | Admitting: Family Medicine

## 2020-10-01 ENCOUNTER — Other Ambulatory Visit: Payer: Self-pay

## 2020-10-01 ENCOUNTER — Ambulatory Visit (INDEPENDENT_AMBULATORY_CARE_PROVIDER_SITE_OTHER): Payer: Medicare Other | Admitting: Family Medicine

## 2020-10-01 VITALS — BP 118/73 | HR 70 | Temp 97.7°F | Ht 68.0 in | Wt 168.8 lb

## 2020-10-01 DIAGNOSIS — I951 Orthostatic hypotension: Secondary | ICD-10-CM | POA: Diagnosis not present

## 2020-10-01 DIAGNOSIS — I2510 Atherosclerotic heart disease of native coronary artery without angina pectoris: Secondary | ICD-10-CM | POA: Diagnosis not present

## 2020-10-01 DIAGNOSIS — I2583 Coronary atherosclerosis due to lipid rich plaque: Secondary | ICD-10-CM

## 2020-10-01 DIAGNOSIS — I10 Essential (primary) hypertension: Secondary | ICD-10-CM | POA: Diagnosis not present

## 2020-10-01 DIAGNOSIS — I639 Cerebral infarction, unspecified: Secondary | ICD-10-CM

## 2020-10-01 DIAGNOSIS — E1165 Type 2 diabetes mellitus with hyperglycemia: Secondary | ICD-10-CM

## 2020-10-01 DIAGNOSIS — I8290 Acute embolism and thrombosis of unspecified vein: Secondary | ICD-10-CM

## 2020-10-01 MED ORDER — INSULIN GLARGINE 100 UNITS/ML SOLOSTAR PEN: 15.0000 [IU] | PEN_INJECTOR | Freq: Every day | SUBCUTANEOUS | 3 refills | Status: DC

## 2020-10-01 NOTE — Patient Instructions (Addendum)
It was very nice to see you today!  Please increase your lantus to 15 units daily.  Please check your fasting blood sugars in the morning.  Let me know if they are persistently elevated.  Please schedule appointment with your cardiologist soon.  I will see you back in 3 months for your next checkup.  Please come back to see me sooner if needed.  Take care, Dr [name]  PLEASE NOTE:  If you had any lab tests please let us know if you have not heard back within a few days. You may see your results on mychart before we have a chance to review them but we will give you a call once they are reviewed by [place]. If we ordered any referrals today, please let us know if you have not heard from their office within the next week.   Please try these tips to maintain a healthy lifestyle:   Eat at least 3 REAL meals and 1-2 snacks per day.  Aim for no more than 5 hours between eating.  If you eat breakfast, please do so within one hour of getting up.    Each meal should contain half fruits/vegetables, one quarter protein, and one quarter carbs (no bigger than a computer mouse)   Cut down on sweet beverages. This includes juice, soda, and sweet tea.     Drink at least 1 glass of water with each meal and aim for at least 8 glasses per day   Exercise at least 150 minutes every week.

## 2020-10-01 NOTE — Assessment & Plan Note (Signed)
Doing well Florinef 0.05 mg daily.  He is however on carvedilol which is likely contributing to his orthostasis as well.  Advised him to follow-up with cardiology soon for discussion medication management.

## 2020-10-01 NOTE — Assessment & Plan Note (Signed)
Anticoagulated on warfarin.  Will likely benefit from switching to NOAC-advised him to follow-up with cardiology to discuss this.

## 2020-10-01 NOTE — Assessment & Plan Note (Signed)
Will be following up with neurology soon.  Thankfully does not have any residual deficits.  Discussed preventative strategies going forward.  His statin was switched from pravastatin to Crestor.  He is already on aspirin and Coumadin.  He will be following up with his cardiologist to discuss switching to DOAC instead of Coumadin.

## 2020-10-01 NOTE — Assessment & Plan Note (Signed)
At goal on Coreg 12.5 twice daily.

## 2020-10-01 NOTE — Assessment & Plan Note (Signed)
Sugars have been in the 200s and 300s at home.  This could be due to his Florinef.  We will increase his Lantus to 15 units daily.  Recommended that he check fasting blood sugars early in the morning and also administer his Lantus at this time.  He was agreeable to this.  They will let me know if blood sugars continue to be persistently elevated.  We will recheck A1c in 3 months.  A1c a few weeks ago in the hospital 7.5.

## 2020-10-01 NOTE — Progress Notes (Signed)
   Thomas [name] is a 87 y.o. male who presents today for an office visit.  Assessment/Plan:  Chronic Problems Addressed Today: Orthostatic hypotension Doing well Florinef 0.05 mg daily.  He is however on carvedilol which is likely contributing to his orthostasis as well.  Advised him to follow-up with cardiology soon for discussion medication management.  Acute CVA (cerebrovascular accident) [facility]) Will be following up with neurology soon.  Thankfully does not have any residual deficits.  Discussed preventative strategies going forward.  His statin was switched from pravastatin to Crestor.  He is already on aspirin and Coumadin.  He will be following up with his cardiologist to discuss switching to DOAC instead of Coumadin.  Type 2 diabetes mellitus with hyperglycemia ([name]) Sugars have been in the 200s and 300s at home.  This could be due to his Florinef.  We will increase his Lantus to 15 units daily.  Recommended that he check fasting blood sugars early in the morning and also administer his Lantus at this time.  He was agreeable to this.  They will let me know if blood sugars continue to be persistently elevated.  We will recheck A1c in 3 months.  A1c a few weeks ago in the hospital 7.5.  Hypertension At goal on Coreg 12.5 twice daily.  Venous thromboembolism (VTE) Anticoagulated on warfarin.  Will likely benefit from switching to NOAC-advised him to follow-up with cardiology to discuss this.     Subjective:  HPI:  Patient here for hospital follow-up.  Presented to the ED on 08/31/2020 with right-sided weakness and facial droop with aphasia.  Symptoms improved by the time he arrived in the emergency department.  Had CT scan done which showed subacute PCA stroke.  MRI showed moderate sized stroke in right inferior occipital lobe.  He was admitted for stroke work-up.  Neurology recommended continued aspirin and warfarin.  He was also found to have Pseudomonas UTI during admission and was  treated with 5 days of cefepime.  He was also found to have orthostatic hypotension and was started on fludrocortisone 0.5 mg daily  He has done well since being home.  He was in rehab for a couple of weeks.  He is still working with home health physical therapy and Occupational Therapy.  He has done well with medication changes.  Will be following up with neurology soon.  Does not have a cardiology visit scheduled for a couple of months.       Objective:  Physical Exam: BP 118/73   Pulse 70   Temp 97.7 F (36.5 C) (Temporal)   Ht 5\' 8"  (1.727 m)   Wt 168 lb 12.8 oz (76.6 kg)   SpO2 99%   BMI 25.67 kg/m   Gen: No acute distress, resting comfortably CV: Regular rate and rhythm with no murmurs appreciated Pulm: Normal work of breathing, clear to auscultation bilaterally with no crackles, wheezes, or rhonchi Neuro: Grossly normal, moves all extremities Psych: Normal affect and thought content  Time Spent: 45 minutes of total time was spent on the date of the encounter performing the following actions: chart review prior to seeing the patient, obtaining history, performing a medically necessary exam, counseling on the treatment plan, placing orders, and documenting in our EHR.       . Katina Degree, MD 10/01/2020 11:23 AM

## 2020-10-03 ENCOUNTER — Other Ambulatory Visit: Payer: Self-pay

## 2020-10-03 MED ORDER — INSULIN PEN NEEDLE 31G X 5 MM MISC: 1.0000 | Freq: Every day | 3 refills | Status: DC

## 2020-10-06 ENCOUNTER — Inpatient Hospital Stay: Payer: Medicare Other | Admitting: Adult Health

## 2020-10-08 ENCOUNTER — Inpatient Hospital Stay: Payer: Medicare Other | Admitting: Hematology and Oncology

## 2020-10-10 ENCOUNTER — Telehealth: Payer: Self-pay | Admitting: Hematology and Oncology

## 2020-10-10 NOTE — Telephone Encounter (Signed)
Scheduled appt per 4/12 sch msg. Pt's wife is aware.

## 2020-10-11 ENCOUNTER — Encounter (HOSPITAL_COMMUNITY): Payer: Self-pay | Admitting: Emergency Medicine

## 2020-10-11 ENCOUNTER — Emergency Department (HOSPITAL_COMMUNITY)
Admission: EM | Admit: 2020-10-11 | Discharge: 2020-10-11 | Disposition: A | Discharge status: Home / Self Care | Payer: Medicare Other | Attending: Emergency Medicine | Admitting: Emergency Medicine

## 2020-10-11 ENCOUNTER — Other Ambulatory Visit: Payer: Self-pay

## 2020-10-11 ENCOUNTER — Emergency Department (HOSPITAL_COMMUNITY): Payer: Medicare Other

## 2020-10-11 DIAGNOSIS — Z79899 Other long term (current) drug therapy: Secondary | ICD-10-CM | POA: Diagnosis not present

## 2020-10-11 DIAGNOSIS — E119 Type 2 diabetes mellitus without complications: Secondary | ICD-10-CM | POA: Insufficient documentation

## 2020-10-11 DIAGNOSIS — I10 Essential (primary) hypertension: Secondary | ICD-10-CM | POA: Insufficient documentation

## 2020-10-11 DIAGNOSIS — Z7982 Long term (current) use of aspirin: Secondary | ICD-10-CM | POA: Diagnosis not present

## 2020-10-11 DIAGNOSIS — Z951 Presence of aortocoronary bypass graft: Secondary | ICD-10-CM | POA: Insufficient documentation

## 2020-10-11 DIAGNOSIS — Z950 Presence of cardiac pacemaker: Secondary | ICD-10-CM | POA: Diagnosis not present

## 2020-10-11 DIAGNOSIS — Z7901 Long term (current) use of anticoagulants: Secondary | ICD-10-CM | POA: Diagnosis not present

## 2020-10-11 DIAGNOSIS — I6381 Other cerebral infarction due to occlusion or stenosis of small artery: Secondary | ICD-10-CM | POA: Diagnosis not present

## 2020-10-11 DIAGNOSIS — Z794 Long term (current) use of insulin: Secondary | ICD-10-CM | POA: Insufficient documentation

## 2020-10-11 DIAGNOSIS — I6340 Cerebral infarction due to embolism of unspecified cerebral artery: Secondary | ICD-10-CM | POA: Diagnosis not present

## 2020-10-11 DIAGNOSIS — R55 Syncope and collapse: Secondary | ICD-10-CM | POA: Insufficient documentation

## 2020-10-11 DIAGNOSIS — I2510 Atherosclerotic heart disease of native coronary artery without angina pectoris: Secondary | ICD-10-CM | POA: Insufficient documentation

## 2020-10-11 DIAGNOSIS — Z96659 Presence of unspecified artificial knee joint: Secondary | ICD-10-CM | POA: Insufficient documentation

## 2020-10-11 DIAGNOSIS — E039 Hypothyroidism, unspecified: Secondary | ICD-10-CM | POA: Diagnosis not present

## 2020-10-11 DIAGNOSIS — S0990XA Unspecified injury of head, initial encounter: Secondary | ICD-10-CM | POA: Diagnosis not present

## 2020-10-11 DIAGNOSIS — R4020 Unspecified coma: Secondary | ICD-10-CM | POA: Diagnosis not present

## 2020-10-11 DIAGNOSIS — R0902 Hypoxemia: Secondary | ICD-10-CM | POA: Diagnosis not present

## 2020-10-11 DIAGNOSIS — G9389 Other specified disorders of brain: Secondary | ICD-10-CM | POA: Diagnosis not present

## 2020-10-11 DIAGNOSIS — R404 Transient alteration of awareness: Secondary | ICD-10-CM | POA: Diagnosis not present

## 2020-10-11 LAB — CBC WITH DIFFERENTIAL/PLATELET
Abs Immature Granulocytes: 0.03 10*3/uL (ref 0.00–0.07)
Basophils Absolute: 0 10*3/uL (ref 0.0–0.1)
Basophils Relative: 1 %
Eosinophils Absolute: 0.3 10*3/uL (ref 0.0–0.5)
Eosinophils Relative: 3 %
HCT: 34 % — ABNORMAL LOW (ref 39.0–52.0)
Hemoglobin: 10.9 g/dL — ABNORMAL LOW (ref 13.0–17.0)
Immature Granulocytes: 0 %
Lymphocytes Relative: 31 %
Lymphs Abs: 2.6 10*3/uL (ref 0.7–4.0)
MCH: 31.8 pg (ref 26.0–34.0)
MCHC: 32.1 g/dL (ref 30.0–36.0)
MCV: 99.1 fL (ref 80.0–100.0)
Monocytes Absolute: 0.7 10*3/uL (ref 0.1–1.0)
Monocytes Relative: 8 %
Neutro Abs: 4.8 10*3/uL (ref 1.7–7.7)
Neutrophils Relative %: 57 %
Platelets: 158 10*3/uL (ref 150–400)
RBC: 3.43 MIL/uL — ABNORMAL LOW (ref 4.22–5.81)
RDW: 13.2 % (ref 11.5–15.5)
WBC: 8.4 10*3/uL (ref 4.0–10.5)
nRBC: 0 % (ref 0.0–0.2)

## 2020-10-11 LAB — COMPREHENSIVE METABOLIC PANEL
ALT: 21 U/L (ref 0–44)
AST: 21 U/L (ref 15–41)
Albumin: 3.2 g/dL — ABNORMAL LOW (ref 3.5–5.0)
Alkaline Phosphatase: 45 U/L (ref 38–126)
Anion gap: 9 (ref 5–15)
BUN: 21 mg/dL (ref 8–23)
CO2: 22 mmol/L (ref 22–32)
Calcium: 8.3 mg/dL — ABNORMAL LOW (ref 8.9–10.3)
Chloride: 108 mmol/L (ref 98–111)
Creatinine, Ser: 0.97 mg/dL (ref 0.61–1.24)
GFR, Estimated: >60 mL/min (ref >60)
Glucose, Bld: 229 mg/dL — ABNORMAL HIGH (ref 70–99)
Potassium: 4 mmol/L (ref 3.5–5.1)
Sodium: 139 mmol/L (ref 135–145)
Total Bilirubin: 0.3 mg/dL (ref 0.3–1.2)
Total Protein: 6.3 g/dL — ABNORMAL LOW (ref 6.5–8.1)

## 2020-10-11 LAB — PROTIME-INR
INR: 3.1 — ABNORMAL HIGH (ref 0.8–1.2)
Prothrombin Time: 32.1 seconds — ABNORMAL HIGH (ref 11.4–15.2)

## 2020-10-11 LAB — TROPONIN I (HIGH SENSITIVITY): Troponin I (High Sensitivity): 9 ng/L (ref <18)

## 2020-10-11 MED ORDER — CARVEDILOL 12.5 MG PO TABS: 6.2500 mg | ORAL_TABLET | Freq: Two times a day (BID) | ORAL | 11 refills | Status: DC

## 2020-10-11 NOTE — ED Provider Notes (Signed)
[facility]-EMERGENCY DEPT Provider Note   CSN: [id number] Arrival date & time: 10/11/20  1257     History No chief complaint on file.   Thomas [name] is a 87 y.o. male.  Abrar Bilton currently feels completely fine.  He had a syncopal episode today while standing.  EMS arrived and noted systolic blood pressures in the 60s.  The patient has had a recent hospitalization during which time he was started on fludrocortisone for orthostatic hypotension.  He has a cardiology appointment pending, but it appears that he has had another syncopal event attributable to his orthostasis.  The history is provided by the patient.  Loss of Consciousness Episode history:  Single Most recent episode:  Today Timing:  Constant Progression:  Unchanged Chronicity:  Recurrent (history of orthostatic hypotension) Context: standing up   Witnessed: yes   Relieved by:  Nothing Worsened by:  Nothing Ineffective treatments:  None tried Associated symptoms: no anxiety, no chest pain, no confusion, no difficulty breathing, no fever, no headaches, no palpitations, no seizures, no shortness of breath, no vomiting and no weakness        Past Medical History:  Diagnosis Date  . Arthritis   . Clotting disorder ([name])   . Coronary artery disease   . Diabetes ([name])   . Hyperlipidemia   . Hypertension   . Hypothyroidism 07/17/2020  . Kidney disease   . Loss of hearing   . Macular degeneration   . Myocardial infarction ([name])   . Osteoarthritis   . Stroke [facility])     Patient Active Problem List   Diagnosis Date Noted  . Orthostatic hypotension 10/01/2020  . TIA (transient ischemic attack) 08/31/2020  . Normocytic anemia 08/31/2020  . Prolonged QT interval 08/31/2020  . Acute CVA (cerebrovascular accident) ([name]) 08/31/2020  . Debility 08/21/2020  . Lytic bone lesion of femur 08/21/2020  . Sick sinus syndrome ([name]) 07/31/2020  . Coronary artery disease s/p CABG 2001 07/17/2020   . Hypothyroidism 07/17/2020  . Venous thromboembolism (VTE) 07/17/2020  . Hypertension 07/17/2020  . Cardiac pacemaker 07/17/2020  . Osteoarthritis 07/17/2020  . Type 2 diabetes mellitus with hyperglycemia ([name]) 07/17/2020  . Vitamin D deficiency 07/17/2020  . Macular degeneration 07/17/2020  . Dyslipidemia due to type 2 diabetes mellitus ([name]) 07/17/2020  . Insomnia 07/17/2020  . BPH (benign prostatic hyperplasia) 07/17/2020  . Colon polyps 07/17/2020    Past Surgical History:  Procedure Laterality Date  . CARPAL TUNNEL RELEASE  2011  . CORONARY ANGIOPLASTY WITH STENT PLACEMENT    . CORONARY ARTERY BYPASS GRAFT  2001   x6  . REPLACEMENT TOTAL KNEE    . SPINE SURGERY  2008   cervical and lumbar  . THYROIDECTOMY         Family History  Problem Relation Age of Onset  . Stroke Mother   . Stroke Father   . Heart disease Sister   . Cancer Daughter   . Hypertension Son   . Heart disease Brother   . Cancer Brother   . Heart disease Brother     Social History   Tobacco Use  . Smoking status: Never Smoker  . Smokeless tobacco: Never Used  Substance Use Topics  . Alcohol use: Never  . Drug use: Never    Home Medications Prior to Admission medications   Medication Sig Start Date End Date Taking? Authorizing Provider  acetaminophen (TYLENOL) 325 MG tablet Take 650 mg by mouth every 6 (six) hours as needed for moderate  pain or headache.    [provider]  aspirin 81 MG chewable tablet Chew 81 mg by mouth daily.    [provider]  b complex vitamins capsule Take 1 capsule by mouth daily.    [provider]  carvedilol (COREG) 12.5 MG tablet Take 1 tablet (12.5 mg total) by mouth 2 (two) times daily. 08/07/20 08/02/21  Meriam Sprague, MD  cholecalciferol (VITAMIN D3) 25 MCG (1000 UNIT) tablet Take 1,000 Units by mouth daily.    [provider]  co-enzyme Q-10 50 MG capsule Take 50 mg by mouth daily.    [provider]   fludrocortisone (FLORINEF) 0.1 MG tablet Take 0.5 tablets (0.05 mg total) by mouth daily. 09/09/20   Cipriano Bunker, MD  glucosamine-chondroitin 500-400 MG tablet Take 1 tablet by mouth 3 (three) times daily.    [provider]  insulin glargine (LANTUS) 100 unit/mL SOPN Inject 15 Units into the skin daily. 10/01/20   Ardith Dark, MD  Insulin Pen Needle 31G X 5 MM MISC 1 each by Does not apply route daily. Please use a new pen needle daily with Lantus injection 10/03/20   Ardith Dark, MD  JANTOVEN 4 MG tablet Take 4 mg by mouth See admin instructions. Monday,Wednesday,Friday and sunday 07/09/20   [provider]  levothyroxine (SYNTHROID) 100 MCG tablet Take 1 tablet (100 mcg total) by mouth daily. 08/19/20   Ardith Dark, MD  mirtazapine (REMERON) 7.5 MG tablet Take 7.5 mg by mouth at bedtime. 07/11/20   [provider]  Multiple Vitamin (MULTIVITAMIN) tablet Take 1 tablet by mouth daily.    [provider]  Red Yeast Rice Extract (RED YEAST RICE PO) Take 1 capsule by mouth daily.    [provider]  rosuvastatin (CRESTOR) 20 MG tablet Take 1 tablet (20 mg total) by mouth daily. Patient taking differently: Take 20 mg by mouth at bedtime. 07/21/20   Meriam Sprague, MD  tamsulosin (FLOMAX) 0.4 MG CAPS capsule Take 0.4 mg by mouth daily after supper. 07/11/20   [provider]  warfarin (COUMADIN) 3 MG tablet TAKE 1 TABLET BY MOUTH DAILY ON TUESDAY, THURSDAY AND SATURDAY Patient taking differently: Take 3 mg by mouth See admin instructions. Tuesday,Thursday, Saturday. 07/23/20   Ardith Dark, MD    Allergies    Amitriptyline, Effexor xr [venlafaxine], Lyrica [pregabalin], Morphine, Morphine and related, Pravachol [pravastatin], and Zolpidem  Review of Systems   Review of Systems  Constitutional: Negative for chills and fever.  HENT: Negative for ear pain and sore throat.   Eyes: Negative for pain and visual disturbance.   Respiratory: Negative for cough and shortness of breath.   Cardiovascular: Positive for syncope. Negative for chest pain and palpitations.  Gastrointestinal: Negative for abdominal pain and vomiting.  Genitourinary: Negative for dysuria and hematuria.  Musculoskeletal: Negative for arthralgias and back pain.  Skin: Negative for color change and rash.  Neurological: Negative for seizures, syncope, weakness and headaches.  Psychiatric/Behavioral: Negative for confusion.  All other systems reviewed and are negative.   Physical Exam Updated Vital Signs BP 131/71   Pulse 69   Temp 97.8 F (36.6 C)   Resp 16   SpO2 100%   Physical Exam Vitals and nursing note reviewed.  Constitutional:      Appearance: He is well-developed.  HENT:     Head: Normocephalic and atraumatic.     Nose: Nose normal.  Eyes:     Extraocular Movements: Extraocular  movements intact.     Conjunctiva/sclera: Conjunctivae normal.     Pupils: Pupils are equal, round, and reactive to light.  Cardiovascular:     Rate and Rhythm: Normal rate and regular rhythm.     Heart sounds: No murmur heard.   Pulmonary:     Effort: Pulmonary effort is normal. No respiratory distress.     Breath sounds: Normal breath sounds.  Abdominal:     Palpations: Abdomen is soft.     Tenderness: There is no abdominal tenderness.  Musculoskeletal:        General: No deformity or signs of injury. Normal range of motion.     Cervical back: Neck supple.  Skin:    General: Skin is warm and dry.  Neurological:     General: No focal deficit present.     Mental Status: He is alert and oriented to person, place, and time.     Cranial Nerves: No cranial nerve deficit.     Sensory: No sensory deficit.     Motor: No weakness.     Coordination: Coordination normal.  Psychiatric:        Mood and Affect: Mood normal.     ED Results / Procedures / Treatments   Labs (all labs ordered are listed, but only abnormal results are  displayed) Labs Reviewed  COMPREHENSIVE METABOLIC PANEL - Abnormal; Notable for the following components:      Result Value   Glucose, Bld 229 (*)    Calcium 8.3 (*)    Total Protein 6.3 (*)    Albumin 3.2 (*)    All other components within normal limits  CBC WITH DIFFERENTIAL/PLATELET - Abnormal; Notable for the following components:   RBC 3.43 (*)    Hemoglobin 10.9 (*)    HCT 34.0 (*)    All other components within normal limits  PROTIME-INR - Abnormal; Notable for the following components:   Prothrombin Time 32.1 (*)    INR 3.1 (*)    All other components within normal limits  TROPONIN I (HIGH SENSITIVITY)  TROPONIN I (HIGH SENSITIVITY)    EKG EKG Interpretation  Date/Time:  Saturday October 11 2020 13:20:14 EDT Ventricular Rate:  71 PR Interval:  153 QRS Duration: 92 QT Interval:  428 QTC Calculation: 466 R Axis:   51 Text Interpretation: Sinus rhythm Abnormal R-wave progression, early transition Borderline repolarization abnormality normal axis no acute ischemia Confirmed by Pieter Partridge ([id number]) on 10/11/2020 3:34:52 PM   Radiology CT Head Wo Contrast  Result Date: 10/11/2020 CLINICAL DATA:  Syncopal episode.  Minor head trauma. EXAM: CT HEAD WITHOUT CONTRAST TECHNIQUE: Contiguous axial images were obtained from the base of the skull through the vertex without intravenous contrast. COMPARISON:  August 31, 2020 FINDINGS: Brain: No evidence of acute infarction, hemorrhage, hydrocephalus, extra-axial collection or mass lesion/mass effect. Chronic diffuse atrophy and chronic bilateral periventricular white matter small vessel ischemic changes are identified. Chronic encephalomalacia of the left temporal lobe is unchanged. Mild chronic encephalomalacia of the right temporal lobe is unchanged. Small lacunar infarctions identified in the bilateral thalamus and right basal ganglia. Vascular: No hyperdense vessel is noted. Skull: Normal. Negative for fracture or focal lesion.  Sinuses/Orbits: Right maxillary sinus retention cyst is noted. The orbits are normal. Other: None. IMPRESSION: 1. No focal acute intracranial abnormality identified. 2. Chronic diffuse atrophy and chronic bilateral periventricular white matter small vessel ischemic change. 3. Chronic encephalomalacia of bilateral temporal lobes, left greater than right, unchanged. Electronically Signed   By: Scherry Ran  Juel BurrowLin M.D.   On: 10/11/2020 14:05    Procedures Procedures   Medications Ordered in ED Medications - No data to display  ED Course  I have reviewed the triage vital signs and the nursing notes.  Pertinent labs & imaging results that were available during my care of the patient were reviewed by me and considered in my medical decision making (see chart for details).  Clinical Course as of 10/11/20 1535  Sat Oct 11, 2020  1503 Patient was seen by cardiology who recommend decreasing carvedilol by half and keeping cardiology follow-up appointment.  They did offer admission, but the patient declined. [AW]    Clinical Course User Index [AW] Koleen DistanceWright, Autum Benfer G, MD   MDM Rules/Calculators/A&P                          Saunders Revelndrew Plack is 87 years old.  He has recently been admitted for CVA and he presents with syncope that is likely orthostasis.  He has a history of the same and has recently been started on fludrocortisone.  When I saw him, he was asymptomatic.  No evidence of trauma.  ED work-up was within normal limits.  I did speak with cardiology who recommended decreasing his Coreg.  They did offer admission to the patient, but he declined secondary to his frustration at being in the hospital multiple times recently.  Fortunately, he has a normal recent echo, and he does have a cardiology appointment scheduled in the future. Final Clinical Impression(s) / ED Diagnoses Final diagnoses:  Syncope, unspecified syncope type    Rx / DC Orders ED Discharge Orders         Ordered    carvedilol (COREG)  12.5 MG tablet  2 times daily        10/11/20 1505           Koleen DistanceWright, Bobbie Valletta G, MD 10/11/20 1536

## 2020-10-11 NOTE — Consult Note (Addendum)
Referring Physician: A. Delford Field, MD  Thomas [name] is an 87 y.o. male.                       Chief Complaint: Syncope  HPI: 86 years old white male with PMH of orthostatic hypotension, recent CVA, type 2 DM and venous thromboembolism has syncopal episode with unresponsiveness and blood pressure 60 . He believes it is due to his recent medication probably carvedilol. He is also on Tamsulosin at night time. His recent echocardiogram showed normal LV systolic function. EKG shows sinus rhythm with inferior wall ischemia. He is on Florinef for orthostatic hypotension.  His Hgb is mildly and chronically low at 10.9 gm today. His INR is 3.1  Past Medical History:  Diagnosis Date  . Arthritis   . Clotting disorder ([name])   . Coronary artery disease   . Diabetes ([name])   . Hyperlipidemia   . Hypertension   . Hypothyroidism 07/17/2020  . Kidney disease   . Loss of hearing   . Macular degeneration   . Myocardial infarction ([name])   . Osteoarthritis   . Stroke [facility])       Past Surgical History:  Procedure Laterality Date  . CARPAL TUNNEL RELEASE  2011  . CORONARY ANGIOPLASTY WITH STENT PLACEMENT    . CORONARY ARTERY BYPASS GRAFT  2001   x6  . REPLACEMENT TOTAL KNEE    . SPINE SURGERY  2008   cervical and lumbar  . THYROIDECTOMY      Family History  Problem Relation Age of Onset  . Stroke Mother   . Stroke Father   . Heart disease Sister   . Cancer Daughter   . Hypertension Son   . Heart disease Brother   . Cancer Brother   . Heart disease Brother    Social History:  reports that he has never smoked. He has never used smokeless tobacco. He reports that he does not drink alcohol and does not use drugs.  Allergies:  Allergies  Allergen Reactions  . Amitriptyline   . Effexor Xr [Venlafaxine]   . Lyrica [Pregabalin]   . Morphine   . Morphine And Related   . Pravachol [Pravastatin]   . Zolpidem     (Not in a hospital admission)   Results for orders placed or performed  during the hospital encounter of 10/11/20 (from the past 48 hour(s))  CBC with Differential     Status: Abnormal   Collection Time: 10/11/20  1:35 PM  Result Value Ref Range   WBC 8.4 4.0 - 10.5 K/uL   RBC 3.43 (L) 4.22 - 5.81 MIL/uL   Hemoglobin 10.9 (L) 13.0 - 17.0 g/dL   HCT 97.0 (L) 26.3 - 78.5 %   MCV 99.1 80.0 - 100.0 fL   MCH 31.8 26.0 - 34.0 pg   MCHC 32.1 30.0 - 36.0 g/dL   RDW 88.5 02.7 - 74.1 %   Platelets 158 150 - 400 K/uL   nRBC 0.0 0.0 - 0.2 %   Neutrophils Relative % 57 %   Neutro Abs 4.8 1.7 - 7.7 K/uL   Lymphocytes Relative 31 %   Lymphs Abs 2.6 0.7 - 4.0 K/uL   Monocytes Relative 8 %   Monocytes Absolute 0.7 0.1 - 1.0 K/uL   Eosinophils Relative 3 %   Eosinophils Absolute 0.3 0.0 - 0.5 K/uL   Basophils Relative 1 %   Basophils Absolute 0.0 0.0 - 0.1 K/uL   Immature Granulocytes 0 %  Abs Immature Granulocytes 0.03 0.00 - 0.07 K/uL    Comment: Performed at [facility], [address], [place], [place] [address]  Protime-INR     Status: Abnormal   Collection Time: 10/11/20  1:35 PM  Result Value Ref Range   Prothrombin Time 32.1 (H) 11.4 - 15.2 seconds   INR 3.1 (H) 0.8 - 1.2    Comment: (NOTE) INR goal varies based on device and disease states. Performed at [facility], [address] [place], [place] [address]    CT Head Wo Contrast  Result Date: 10/11/2020 CLINICAL DATA:  Syncopal episode.  Minor head trauma. EXAM: CT HEAD WITHOUT CONTRAST TECHNIQUE: Contiguous axial images were obtained from the base of the skull through the vertex without intravenous contrast. COMPARISON:  August 31, 2020 FINDINGS: Brain: No evidence of acute infarction, hemorrhage, hydrocephalus, extra-axial collection or mass lesion/mass effect. Chronic diffuse atrophy and chronic bilateral periventricular white matter small vessel ischemic changes are identified. Chronic encephalomalacia of the left temporal lobe is unchanged. Mild chronic  encephalomalacia of the right temporal lobe is unchanged. Small lacunar infarctions identified in the bilateral thalamus and right basal ganglia. Vascular: No hyperdense vessel is noted. Skull: Normal. Negative for fracture or focal lesion. Sinuses/Orbits: Right maxillary sinus retention cyst is noted. The orbits are normal. Other: None. IMPRESSION: 1. No focal acute intracranial abnormality identified. 2. Chronic diffuse atrophy and chronic bilateral periventricular white matter small vessel ischemic change. 3. Chronic encephalomalacia of bilateral temporal lobes, left greater than right, unchanged. Electronically Signed   By: Sherian Rein M.D.   On: 10/11/2020 14:05    Review Of Systems Constitutional: No fever, chills, Chronic weight loss. Eyes: No vision change, wears glasses. No discharge or pain. Ears: No hearing loss, No tinnitus. Very hard of hearing Respiratory: No asthma, COPD, pneumonias. No shortness of breath. No hemoptysis. Cardiovascular: No chest pain, palpitation, leg edema. Positive dizziness. Gastrointestinal: No nausea, vomiting, diarrhea, constipation. No GI bleed. No hepatitis. Genitourinary: No dysuria, hematuria, kidney stone. No incontinance. Neurological: No headache, h/o stroke, no seizures.  Psychiatry: No psych facility admission for anxiety, depression, suicide. No detox. Skin: No rash. Musculoskeletal: Positive joint pain, no fibromyalgia. No neck pain, back pain. Lymphadenopathy: No lymphadenopathy. Hematology: Positive anemia. No easy bruising.   Blood pressure 123/60, pulse 70, temperature 97.8 F (36.6 C), resp. rate 17, SpO2 98 %. There is no height or weight on file to calculate BMI. General appearance: alert, cooperative, appears stated age and no distress Head: Normocephalic, atraumatic. Eyes: Blue eyes, Pale pink conjunctiva, corneas clear.  Neck: No adenopathy, no carotid bruit, no JVD, supple, symmetrical, trachea midline and thyroid not  enlarged. Resp: Clear to auscultation bilaterally. Cardio: Regular rate and rhythm, S1, S2 normal, III/VI systolic murmur, no click, rub or gallop GI: Soft, non-tender; bowel sounds normal; no organomegaly. Extremities: No edema, cyanosis or clubbing. Skin: Warm and dry.  Neurologic: Alert and oriented X 2, normal strength.   Assessment/Plan Syncope Orthostatic hypotension S/P stroke Type 2 DM H/O venous thromboembolism Bilateral hearing loss  Admit and monitor rhythm. Patient refuses to stay. Hence consider: Decreasing Carvedilol dose by 50 % and more if needed. Keep systolic blood pressure 10-20 points higher than normal to maintain cerebral perfusion in elderly patient. Continue Tamsulosin at night for now if tolerated.  Continue Florinef. Must use walker all the time. Keep Cardiology appointment.  Time spent: Review of old records, Lab, x-rays, EKG, other cardiac tests, examination, discussion with patient/Doctor and wife  over 70 minutes.  Ricki Rodriguez, MD  10/11/2020, 2:33 PM

## 2020-10-11 NOTE — ED Triage Notes (Signed)
Patient BIBA from home, he reports a syncopal episode when standing up from recliner. EMS reports patient was unresponsive on arrival w/ BP of 60 systolic.  500 cc NS given, BP 144/81  BP 144/81 HR 100 98% RA CBG 247

## 2020-10-16 ENCOUNTER — Encounter: Payer: Self-pay | Admitting: Adult Health

## 2020-10-16 ENCOUNTER — Ambulatory Visit: Payer: Medicare Other | Admitting: Adult Health

## 2020-10-16 VITALS — BP 132/92 | HR 72 | Ht 68.0 in | Wt 172.0 lb

## 2020-10-16 DIAGNOSIS — I639 Cerebral infarction, unspecified: Secondary | ICD-10-CM | POA: Diagnosis not present

## 2020-10-16 DIAGNOSIS — Z7901 Long term (current) use of anticoagulants: Secondary | ICD-10-CM | POA: Diagnosis not present

## 2020-10-16 NOTE — Patient Instructions (Addendum)
Continue aspirin 81 mg daily and warfarin daily  and Crestor  for secondary stroke prevention  Please schedule follow up with cardiology for sooner visit regarding blood pressure issues and possibly switching from warfarin to eliquis  Continue to follow up with PCP regarding cholesterol, blood pressure and diabetes management  Maintain strict control of hypertension with blood pressure goal between 130-150, diabetes with hemoglobin A1c goal below 7% and cholesterol with LDL cholesterol (bad cholesterol) goal below 70 mg/dL.      Followup in the future with me in 4 months or call earlier if needed     Thank you for coming to see [place] at [facility]. I hope we have been able to provide you high quality care today.  You may receive a patient satisfaction survey over the next few weeks. We would appreciate your feedback and comments so that we may continue to improve ourselves and the health of our patients.

## 2020-10-16 NOTE — Progress Notes (Signed)
[facility] [address] [place]. [place] [address] [phone]       HOSPITAL FOLLOW UP NOTE  Mr. Terran Klinke Date of Birth:  [birth year] Medical Record Number:  [id number]   Reason for Referral:  hospital stroke follow up    SUBJECTIVE:   CHIEF COMPLAINT:  Chief Complaint  Patient presents with  . Follow-up    RM 62 with son Corene Cornea) Pt is well, has some fatigue but overall well and no complaints     HPI:   Mr.Kadden Cranfordis a 87 y.o.malewith history of coagulopathy, CAD, DM, HTN, HLD,coagulopathy on coumadin, chronic strokeswithresidualright sided deficits,rightfacial droop.  He presented to [facility] ED on 08/31/2020 with confusion and "vision" problems in setting of low BP.  Personally reviewed hospitalization pertinent progress notes, lab work and imaging with summary provided.  Evaluated by Dr. Erlinda Hong with stroke work-up revealing right occipital infarct likely secondary to diffuse severe intracranial stenosis in setting of low BP or orthostatic hypotension. Recommended continuation of aspirin and warfarin at discharge.  History of HTN as well as orthostatic hypotension with long-term BP goal 130-150 to ensure adequate perfusion with initiation of fludrocortisone with recent home readings 80-90s/40-50s.  Chronic anticoagulation on warfarin for over 20 years due to history of DVT and PE - per note review, oncologist would like to change from warfarin to Eliquis but pt has yet to discuss this with his cardiologist. Pt reported difficulty to control INR levels -recommended outpatient follow-up for further discussion with cardiologist.  LDL 37 on Crestor 20 mg daily.  A1c 7.5 on Lantus on Jantoven.  Other stroke risk factors include advanced age, history of stroke 2010 and CAD and MI s/p CABG.  Other active problems include SSS s/p pacemaker and multiple myeloma work-up with oncology.  Evaluated by therapies and recommended discharge to SNF for ongoing therapy  needs   Stroke - R occipital infarct,etiology likely secondary to diffuse severe intracranial stenosisin setting of low BPor orthostatic hypotension. However, hypercoagulable or occult afib with fluctuating INR is also possible  BPZ:WCHEN subacuterightPCA distribution stroke.  CTA head & necksevere intracranial atherosclerosis with widespread and high-grade stenoses. Right ICA bulb 60% stenosis  MRI Small to moderate size acute infarction in the inferior right occipital lobe. No hemorrhage.  2D Echo2/11/22 EF 60-65%  LDL37  HgbA1c7.5  VTE prophylaxis -coumadin  ASA + Coumadinprior to admission, now onASA 81 and coumadin.  Therapy recommendations:SNF  Disposition:SNF   Today, 10/16/2020, Mr. Jellison is being seen for hospital follow-up accompanied by his son  He has recovered well from stroke standpoint currently back at baseline.  He returned back home from SNF rehab after 2 weeks.  History of prior stroke with residual right-sided deficits which are currently at baseline.  He is currently working with [facility] PT/OT and ambulates with rolling walker.  Denies new stroke/TIA symptoms  Remains on aspirin and Coumadin with mild bruising but no bleeding Remains on Crestor without associated side effects Blood pressure today initially elevated and on recheck 132/92 -fluctuation of blood pressure remains his largest concern at today's visit.  This has been a ongoing issue over the past 3 years per son.  He was evaluated in the ED over the weekend due to syncopal episode in setting of hypotension with BP 60s/30s.  He decreased his carvedilol dosage and also remains on Florinef daily - wife tries to check BP daily but per son, will occasionally forget. Per son, wife tried to schedule sooner follow-up visit with cardiology but no sooner  visit available -currently scheduled 6/2.  Glucose levels monitored at home which have been elevated - he was recently advised by PCP to check his  blood sugars in the morning for accurate fasting levels as he currently monitors in the evening - also recently increase Lantus dosage.  No further concerns at this time      ROS:   14 system review of systems performed and negative with exception of those listed in HPI  PMH:  Past Medical History:  Diagnosis Date  . Arthritis   . Clotting disorder ([place])   . Coronary artery disease   . Diabetes ([place])   . Hyperlipidemia   . Hypertension   . Hypothyroidism 07/17/2020  . Kidney disease   . Loss of hearing   . Macular degeneration   . Myocardial infarction ([place])   . Osteoarthritis   . Stroke [facility])     PSH:  Past Surgical History:  Procedure Laterality Date  . CARPAL TUNNEL RELEASE  2011  . CORONARY ANGIOPLASTY WITH STENT PLACEMENT    . CORONARY ARTERY BYPASS GRAFT  2001   x6  . REPLACEMENT TOTAL KNEE    . SPINE SURGERY  2008   cervical and lumbar  . THYROIDECTOMY      Social History:  Social History   Socioeconomic History  . Marital status: Married    Spouse name: Not on file  . Number of children: Not on file  . Years of education: Not on file  . Highest education level: Not on file  Occupational History  . Not on file  Tobacco Use  . Smoking status: Never Smoker  . Smokeless tobacco: Never Used  Substance and Sexual Activity  . Alcohol use: Never  . Drug use: Never  . Sexual activity: Not on file  Other Topics Concern  . Not on file  Social History Narrative  . Not on file   Social Determinants of Health   Financial Resource Strain: Not on file  Food Insecurity: Not on file  Transportation Needs: Not on file  Physical Activity: Not on file  Stress: Not on file  Social Connections: Not on file  Intimate Partner Violence: Not on file    Family History:  Family History  Problem Relation Age of Onset  . Stroke Mother   . Stroke Father   . Heart disease Sister   . Cancer Daughter   . Hypertension Son   . Heart disease Brother   . Cancer  Brother   . Heart disease Brother     Medications:   Current Outpatient Medications on File Prior to Visit  Medication Sig Dispense Refill  . acetaminophen (TYLENOL) 325 MG tablet Take 650 mg by mouth every 6 (six) hours as needed for moderate pain or headache.    Marland Kitchen aspirin 81 MG chewable tablet Chew 81 mg by mouth daily.    Marland Kitchen b complex vitamins capsule Take 1 capsule by mouth daily.    . carvedilol (COREG) 12.5 MG tablet Take 0.5 tablets (6.25 mg total) by mouth 2 (two) times daily. 30 tablet 11  . cholecalciferol (VITAMIN D3) 25 MCG (1000 UNIT) tablet Take 1,000 Units by mouth daily.    Marland Kitchen co-enzyme Q-10 50 MG capsule Take 50 mg by mouth daily.    . fludrocortisone (FLORINEF) 0.1 MG tablet Take 0.5 tablets (0.05 mg total) by mouth daily. 30 tablet 0  . glucosamine-chondroitin 500-400 MG tablet Take 1 tablet by mouth 3 (three) times daily.    . insulin  glargine (LANTUS) 100 unit/mL SOPN Inject 15 Units into the skin daily. 15 mL 3  . Insulin Pen Needle 31G X 5 MM MISC 1 each by Does not apply route daily. Please use a new pen needle daily with Lantus injection 100 each 3  . JANTOVEN 4 MG tablet Take 4 mg by mouth See admin instructions. Monday,Wednesday,Friday and sunday    . levothyroxine (SYNTHROID) 100 MCG tablet Take 1 tablet (100 mcg total) by mouth daily. 90 tablet 0  . mirtazapine (REMERON) 7.5 MG tablet Take 7.5 mg by mouth at bedtime.    . Multiple Vitamin (MULTIVITAMIN) tablet Take 1 tablet by mouth daily.    . Red Yeast Rice Extract (RED YEAST RICE PO) Take 1 capsule by mouth daily.    . rosuvastatin (CRESTOR) 20 MG tablet Take 1 tablet (20 mg total) by mouth daily. (Patient taking differently: Take 20 mg by mouth at bedtime.) 90 tablet 0  . tamsulosin (FLOMAX) 0.4 MG CAPS capsule Take 0.4 mg by mouth daily after supper.    . warfarin (COUMADIN) 3 MG tablet TAKE 1 TABLET BY MOUTH DAILY ON TUESDAY, THURSDAY AND SATURDAY (Patient taking differently: Take 3 mg by mouth See admin  instructions. Tuesday,Thursday, Saturday.) 38 tablet 0   No current facility-administered medications on file prior to visit.    Allergies:   Allergies  Allergen Reactions  . Amitriptyline   . Effexor Xr [Venlafaxine]   . Lyrica [Pregabalin]   . Morphine   . Morphine And Related   . Pravachol [Pravastatin]   . Zolpidem       OBJECTIVE:  Physical Exam  Vitals:   10/16/20 1307  BP: (!) 132/92  Pulse: 72  Weight: 172 lb (78 kg)  Height: '5\' 8"'  (1.727 m)   Body mass index is 26.15 kg/m. No exam data present  Post stroke PHQ 2/9 Depression screen PHQ 2/9 10/16/2020  Decreased Interest 0  Down, Depressed, Hopeless 0  PHQ - 2 Score 0     General: well developed, well nourished,  very pleasant elderly Caucasian male, seated, in no evident distress Head: head normocephalic and atraumatic.   Neck: supple with no carotid or supraclavicular bruits Cardiovascular: regular rate and rhythm, no murmurs Musculoskeletal: no deformity Skin:  no rash/petichiae Vascular:  Normal pulses all extremities   Neurologic Exam Mental Status: Awake and fully alert.   Fluent speech and language.  Oriented to place and time. Recent and remote memory intact. Attention span, concentration and fund of knowledge appropriate. Mood and affect appropriate.  Cranial Nerves: Fundoscopic exam reveals sharp disc margins. Pupils equal, briskly reactive to light. Extraocular movements full without nystagmus. Visual fields difficulty to fully assess with poor vision at baseline.  HOH bilaterally. Facial sensation intact.  Mild right nasolabial fold flattening.  Tongue, palate moves normally and symmetrically.  Motor: Chronic mild right hemiparesis otherwise strength intact  Sensory.:  Slightly decreased light touch sensation right upper and lower extremity Coordination: Rapid alternating movements normal in all extremities. Finger-to-nose and heel-to-shin performed accurately bilaterally. Gait and Station:  Deferred as rolling walker not present Reflexes: 1+ and symmetric. Toes downgoing.     NIHSS  1 Modified Rankin  1      ASSESSMENT: Cordarro Spinnato is a 87 y.o. year old male presented with confusion and " visual problems" in setting of low BP on 08/31/2020 with stroke work-up revealing right occipital infarct likely secondary to diffuse severe intracranial stenosis in setting of low BP or orthostatic hypotension. Vascular risk  factors include history of coagulopathy with DVT and PE on chronic warfarin, CAD, HTN with orthostatic hypotension, HLD, DM and history of chronic strokes with residual right-sided deficits.      PLAN:  1. Right occipital stroke:  a. Recovered well without residual deficit.   b. Continue aspirin 81 mg daily and warfarin daily  and Crestor for secondary stroke prevention.   c. Discussed secondary stroke prevention measures and importance of close PCP follow up for aggressive stroke risk factor management  d. HTN with orthostatic hypotension: BP goal between 130-150 given severe intracranial stenosis.  Currently on carvedilol as well as Florinef with continued uncontrolled blood pressure with continued frequent hypotension -encourage contacting cardiology office to schedule sooner follow-up visit for further discussion e. HLD: LDL goal <70. Recent LDL 37 on Crestor 20 mg daily per PCP.  f. DMII: A1c goal<7.0. Recent A1c 7.5 on Lantus.  Reiterated PCP recommendation of checking fasting glucose levels in the morning as this will give a more accurate reading.  Continue to follow with PCP for monitoring and management 2. Chronic anticoagulation: Currently on warfarin with difficulty to control INR levels.  It was recommended to further discuss with cardiology possible switch to Eliquis which was also recommended by oncology    Follow up in 4 months or call earlier if needed   CC:  [place] provider: Dr. Leonie Man PCP: Vivi Barrack, MD    I spent 45 minutes of  face-to-face and non-face-to-face time with patient and son.  This included previsit chart review including recent hospitalization pertinent progress notes, lab work and imaging, lab review, study review, order entry, electronic health record documentation, patient education regarding recent stroke and etiology, importance of managing stroke risk factors as well as adequate blood pressure control, follow-up with cardiology for blood pressure control and anticoagulation use, and answered all other questions to patient and sons satisfaction  Frann Rider, AGNP-BC  [facility] [address] [place], [place] [address]  Phone [phone] Fax [phone] Note: This document was prepared with digital dictation and possible smart phrase technology. Any transcriptional errors that result from this process are unintentional.

## 2020-10-17 ENCOUNTER — Ambulatory Visit: Payer: Medicare Other | Admitting: Family Medicine

## 2020-10-17 ENCOUNTER — Ambulatory Visit (HOSPITAL_COMMUNITY)
Admission: RE | Admit: 2020-10-17 | Discharge: 2020-10-17 | Disposition: A | Discharge status: Home / Self Care | Payer: Medicare Other | Source: Ambulatory Visit | Attending: Hematology and Oncology | Admitting: Hematology and Oncology

## 2020-10-17 ENCOUNTER — Other Ambulatory Visit: Payer: Self-pay

## 2020-10-17 DIAGNOSIS — K5730 Diverticulosis of large intestine without perforation or abscess without bleeding: Secondary | ICD-10-CM | POA: Diagnosis not present

## 2020-10-17 DIAGNOSIS — M160 Bilateral primary osteoarthritis of hip: Secondary | ICD-10-CM | POA: Diagnosis not present

## 2020-10-17 DIAGNOSIS — M898X5 Other specified disorders of bone, thigh: Secondary | ICD-10-CM | POA: Diagnosis not present

## 2020-10-17 DIAGNOSIS — N320 Bladder-neck obstruction: Secondary | ICD-10-CM | POA: Diagnosis not present

## 2020-10-17 DIAGNOSIS — N3289 Other specified disorders of bladder: Secondary | ICD-10-CM | POA: Diagnosis not present

## 2020-10-17 MED ORDER — GADOBUTROL 1 MMOL/ML IV SOLN
7.5000 mL | Freq: Once | INTRAVENOUS | Status: AC | PRN
  Administered 2020-10-17: 7.5 mL via INTRAVENOUS

## 2020-10-17 NOTE — Progress Notes (Signed)
Informed of MRI for today.   Device system confirmed to be MRI conditional, with implant date > 6 weeks ago and no evidence of abandoned or epicardial leads in review of most recent CXR Interrogation from today reviewed, pt is currently AP-VS at ~70 bpm Change device settings for MRI to DOO at 85 bpm  Tachy-therapies to off if applicable.  Program device back to pre-MRI settings after completion of exam.  Thomas [name], Thomas [name]  10/17/2020 12:43 PM

## 2020-10-17 NOTE — Progress Notes (Signed)
[facility] pelvis W/WO contrast. Patient has medtronic device. [facility]link express sent to [facility] -rep and Andy-cardiology PA. Orders received for DOO 85. Will re-program once scan is complete

## 2020-10-18 ENCOUNTER — Other Ambulatory Visit: Payer: Self-pay | Admitting: Family Medicine

## 2020-10-20 ENCOUNTER — Inpatient Hospital Stay: Payer: Medicare Other

## 2020-10-20 ENCOUNTER — Other Ambulatory Visit: Payer: Self-pay | Admitting: *Deleted

## 2020-10-20 ENCOUNTER — Inpatient Hospital Stay: Payer: Medicare Other | Admitting: Hematology and Oncology

## 2020-10-20 ENCOUNTER — Telehealth: Payer: Self-pay | Admitting: *Deleted

## 2020-10-20 DIAGNOSIS — I25119 Atherosclerotic heart disease of native coronary artery with unspecified angina pectoris: Secondary | ICD-10-CM

## 2020-10-20 DIAGNOSIS — E1159 Type 2 diabetes mellitus with other circulatory complications: Secondary | ICD-10-CM

## 2020-10-20 DIAGNOSIS — H35033 Hypertensive retinopathy, bilateral: Secondary | ICD-10-CM | POA: Diagnosis not present

## 2020-10-20 DIAGNOSIS — E1165 Type 2 diabetes mellitus with hyperglycemia: Secondary | ICD-10-CM

## 2020-10-20 DIAGNOSIS — E109 Type 1 diabetes mellitus without complications: Secondary | ICD-10-CM | POA: Diagnosis not present

## 2020-10-20 DIAGNOSIS — Z79899 Other long term (current) drug therapy: Secondary | ICD-10-CM

## 2020-10-20 DIAGNOSIS — H353231 Exudative age-related macular degeneration, bilateral, with active choroidal neovascularization: Secondary | ICD-10-CM | POA: Diagnosis not present

## 2020-10-20 DIAGNOSIS — I2510 Atherosclerotic heart disease of native coronary artery without angina pectoris: Secondary | ICD-10-CM

## 2020-10-20 DIAGNOSIS — I2583 Coronary atherosclerosis due to lipid rich plaque: Secondary | ICD-10-CM

## 2020-10-20 DIAGNOSIS — Z950 Presence of cardiac pacemaker: Secondary | ICD-10-CM

## 2020-10-20 DIAGNOSIS — H43813 Vitreous degeneration, bilateral: Secondary | ICD-10-CM | POA: Diagnosis not present

## 2020-10-20 MED ORDER — ROSUVASTATIN CALCIUM 20 MG PO TABS: 20.0000 mg | ORAL_TABLET | Freq: Every day | ORAL | 3 refills | Status: DC

## 2020-10-20 NOTE — Telephone Encounter (Signed)
He needs to take his BP meds regularly. Would like for him to let us know if persistently elevated despite medication.

## 2020-10-20 NOTE — Telephone Encounter (Signed)
Thomas [name], PT called  Stated patient BP today 176/91 HR 71 O2 sat 99% Patient have not taken his medication this morning. Patient takes medication with meal.  Please advise

## 2020-10-20 NOTE — Progress Notes (Signed)
I agree with the above plan 

## 2020-10-20 NOTE — Telephone Encounter (Signed)
PT notified, patient aware

## 2020-10-23 ENCOUNTER — Telehealth: Payer: Self-pay | Admitting: *Deleted

## 2020-10-23 MED ORDER — FERROUS SULFATE 325 (65 FE) MG PO TBEC: 325.0000 mg | DELAYED_RELEASE_TABLET | Freq: Every day | ORAL | 3 refills | Status: DC

## 2020-10-23 NOTE — Telephone Encounter (Signed)
TCT patient and spoke with his wife. Informed her that the MRI did not show any lesions that were noted on regular XRAY.  Also advised that that Thomas [name] has iron deficiency anemia and will need oral iron supplement. Advised that I would call this in to his pharmacy. His wife voiced understanding.

## 2020-10-23 NOTE — Telephone Encounter (Signed)
-----   Message from Jaci Standard, MD sent at 10/21/2020 11:16 AM EDT ----- Please let [name] know that his MRI showed no signs of the lesions noted on Xray. His labs do show an iron deficiency anemia. Please call in ferrous sulfate 325mg  PO daily to a pharmacy of his choosing. He is scheduled for a follow visit on 11/03/2020, recommend we cancel this and push it out 3 months ( July 2022).  ----- Message ----- From: 08-03-1973, Rad Results In Sent: 10/17/2020   8:09 PM EDT To: 10/19/2020, MD

## 2020-10-24 ENCOUNTER — Telehealth: Payer: Self-pay | Admitting: Hematology and Oncology

## 2020-10-24 ENCOUNTER — Telehealth: Payer: Self-pay

## 2020-10-24 ENCOUNTER — Other Ambulatory Visit: Payer: Self-pay | Admitting: *Deleted

## 2020-10-24 DIAGNOSIS — N400 Enlarged prostate without lower urinary tract symptoms: Secondary | ICD-10-CM | POA: Diagnosis not present

## 2020-10-24 DIAGNOSIS — E1169 Type 2 diabetes mellitus with other specified complication: Secondary | ICD-10-CM | POA: Diagnosis not present

## 2020-10-24 DIAGNOSIS — E785 Hyperlipidemia, unspecified: Secondary | ICD-10-CM | POA: Diagnosis not present

## 2020-10-24 DIAGNOSIS — E039 Hypothyroidism, unspecified: Secondary | ICD-10-CM | POA: Diagnosis not present

## 2020-10-24 DIAGNOSIS — H9190 Unspecified hearing loss, unspecified ear: Secondary | ICD-10-CM | POA: Diagnosis not present

## 2020-10-24 DIAGNOSIS — D631 Anemia in chronic kidney disease: Secondary | ICD-10-CM | POA: Diagnosis not present

## 2020-10-24 DIAGNOSIS — E1165 Type 2 diabetes mellitus with hyperglycemia: Secondary | ICD-10-CM | POA: Diagnosis not present

## 2020-10-24 DIAGNOSIS — I2510 Atherosclerotic heart disease of native coronary artery without angina pectoris: Secondary | ICD-10-CM | POA: Diagnosis not present

## 2020-10-24 DIAGNOSIS — M150 Primary generalized (osteo)arthritis: Secondary | ICD-10-CM | POA: Diagnosis not present

## 2020-10-24 DIAGNOSIS — N189 Chronic kidney disease, unspecified: Secondary | ICD-10-CM | POA: Diagnosis not present

## 2020-10-24 DIAGNOSIS — I69320 Aphasia following cerebral infarction: Secondary | ICD-10-CM | POA: Diagnosis not present

## 2020-10-24 DIAGNOSIS — I1310 Hypertensive heart and chronic kidney disease without heart failure, with stage 1 through stage 4 chronic kidney disease, or unspecified chronic kidney disease: Secondary | ICD-10-CM | POA: Diagnosis not present

## 2020-10-24 DIAGNOSIS — I951 Orthostatic hypotension: Secondary | ICD-10-CM | POA: Diagnosis not present

## 2020-10-24 DIAGNOSIS — I69392 Facial weakness following cerebral infarction: Secondary | ICD-10-CM | POA: Diagnosis not present

## 2020-10-24 DIAGNOSIS — I69351 Hemiplegia and hemiparesis following cerebral infarction affecting right dominant side: Secondary | ICD-10-CM | POA: Diagnosis not present

## 2020-10-24 DIAGNOSIS — E1122 Type 2 diabetes mellitus with diabetic chronic kidney disease: Secondary | ICD-10-CM | POA: Diagnosis not present

## 2020-10-24 MED ORDER — FLUDROCORTISONE ACETATE 0.1 MG PO TABS: 0.0500 mg | ORAL_TABLET | Freq: Every day | ORAL | 0 refills | Status: DC

## 2020-10-24 NOTE — Telephone Encounter (Signed)
Rx send patient notified

## 2020-10-24 NOTE — Telephone Encounter (Signed)
Ok to send in 30 day supply until he sees cardiology.  Katina Degree. [name], MD 10/24/2020 3:41 PM

## 2020-10-24 NOTE — Telephone Encounter (Signed)
Scheduled appts per 4/28 sch msg. Called pt, no answer. Left msg with appts date and times.

## 2020-10-24 NOTE — Telephone Encounter (Signed)
Ok to refill Lucianne Muss? Last order by Alverda Skeans, MD

## 2020-10-24 NOTE — Telephone Encounter (Signed)
.   LAST APPOINTMENT DATE: 10/20/2020   NEXT APPOINTMENT DATE:@7 /11/2020  MEDICATION:fludrocortisone (FLORINEF) 0.1 MG tablet   PHARMACY:[id number] - [place], [place] - [address]

## 2020-10-30 ENCOUNTER — Ambulatory Visit (INDEPENDENT_AMBULATORY_CARE_PROVIDER_SITE_OTHER): Payer: Medicare Other

## 2020-10-30 DIAGNOSIS — I495 Sick sinus syndrome: Secondary | ICD-10-CM | POA: Diagnosis not present

## 2020-10-31 LAB — CUP PACEART REMOTE DEVICE CHECK
Battery Remaining Longevity: 144 mo
Battery Voltage: 3.02 V
Brady Statistic AP VP Percent: 0.07 %
Brady Statistic AP VS Percent: 95.46 %
Brady Statistic AS VP Percent: 0 %
Brady Statistic AS VS Percent: 4.46 %
Brady Statistic RA Percent Paced: 96.58 %
Brady Statistic RV Percent Paced: 0.08 %
Date Time Interrogation Session: 20220506113142
Implantable Lead Implant Date: 20081106
Implantable Lead Implant Date: 20210210
Implantable Lead Location: 753859
Implantable Lead Location: 753860
Implantable Lead Model: 4076
Implantable Lead Model: 4076
Implantable Pulse Generator Implant Date: 20210210
Lead Channel Impedance Value: 266 Ohm
Lead Channel Impedance Value: 342 Ohm
Lead Channel Impedance Value: 361 Ohm
Lead Channel Impedance Value: 551 Ohm
Lead Channel Pacing Threshold Amplitude: 0.625 V
Lead Channel Pacing Threshold Amplitude: 0.875 V
Lead Channel Pacing Threshold Pulse Width: 0.4 ms
Lead Channel Pacing Threshold Pulse Width: 0.4 ms
Lead Channel Sensing Intrinsic Amplitude: 1 mV
Lead Channel Sensing Intrinsic Amplitude: 1 mV
Lead Channel Sensing Intrinsic Amplitude: 11 mV
Lead Channel Sensing Intrinsic Amplitude: 11 mV
Lead Channel Setting Pacing Amplitude: 1.5 V
Lead Channel Setting Pacing Amplitude: 2 V
Lead Channel Setting Pacing Pulse Width: 0.4 ms
Lead Channel Setting Sensing Sensitivity: 0.9 mV

## 2020-11-03 ENCOUNTER — Inpatient Hospital Stay: Payer: Medicare Other

## 2020-11-03 ENCOUNTER — Inpatient Hospital Stay: Payer: Medicare Other | Admitting: Hematology and Oncology

## 2020-11-14 ENCOUNTER — Other Ambulatory Visit: Payer: Self-pay | Admitting: Family Medicine

## 2020-11-19 NOTE — Progress Notes (Signed)
Remote pacemaker transmission.   

## 2020-11-23 NOTE — Progress Notes (Deleted)
Cardiology Office Note:    Date:  11/23/2020   ID:  Thomas [name], DOB [birth year], MRN [id number]  PCP:  Vivi Barrack, MD   [facility] Providers Cardiologist:  None {    Referring MD: Vivi Barrack, MD    History of Present Illness:    Thomas [name] is a 87 y.o. male with a hx of  HLD, DMII, prior CVA, HTN, CAD s/p CABG in 2001, SSS s/p PPM, pulmonary embolism on warfarin, orthostatic hypotension and hypothyroidism who returns to clinic for follow-up.  Patient has a history of MI at age 44that wasmanaged medically at that time. He had subsequent PCIs and MI in 2001 leading to bypass surgery. He states he has had no chest pain or shortness of breath since his surgery. No stents placed after bypass surgery. Blood pressures fluctuate significantly at home from 90/50s to 200s/100s.Was seen in HTN clinic and where his clonidine was weaned, pindolol was changed to coreg; ramipril and chlorthalidone were continued at current doses. Also saw Dr. Lovena Le with EP to manage his medtronic PPM which was functioning well.   TTE showed LVEF 60-65%, moderate basal-septal hypertrophy, G1DD, dilated aorta/aortic root, moderate aortic sclerosis without stenosis, moderate MAC, mild MR.  Had recent fall where he was seen in ED in 07/2020. CT head negative for acute pathology. MRI head with remote strokes (known) but no acute pathology.   Was seen on 10/11/20 for syncope thought to be due to coreg. He was recommended for admission but he did not want to stay. His coreg was decreased.   Past Medical History:  Diagnosis Date  . Arthritis   . Clotting disorder ([place])   . Coronary artery disease   . Diabetes ([place])   . Hyperlipidemia   . Hypertension   . Hypothyroidism 07/17/2020  . Kidney disease   . Loss of hearing   . Macular degeneration   . Myocardial infarction ([place])   . Osteoarthritis   . Stroke [facility])     Past Surgical History:  Procedure Laterality Date  . CARPAL TUNNEL  RELEASE  2011  . CORONARY ANGIOPLASTY WITH STENT PLACEMENT    . CORONARY ARTERY BYPASS GRAFT  2001   x6  . REPLACEMENT TOTAL KNEE    . SPINE SURGERY  2008   cervical and lumbar  . THYROIDECTOMY      Current Medications: No outpatient medications have been marked as taking for the 11/27/20 encounter (Appointment) with Freada Bergeron, MD.     Allergies:   Amitriptyline, Effexor xr [venlafaxine], Lyrica [pregabalin], Morphine, Morphine and related, Pravachol [pravastatin], and Zolpidem   Social History   Socioeconomic History  . Marital status: Married    Spouse name: Not on file  . Number of children: Not on file  . Years of education: Not on file  . Highest education level: Not on file  Occupational History  . Not on file  Tobacco Use  . Smoking status: Never Smoker  . Smokeless tobacco: Never Used  Substance and Sexual Activity  . Alcohol use: Never  . Drug use: Never  . Sexual activity: Not on file  Other Topics Concern  . Not on file  Social History Narrative  . Not on file   Social Determinants of Health   Financial Resource Strain: Not on file  Food Insecurity: Not on file  Transportation Needs: Not on file  Physical Activity: Not on file  Stress: Not on file  Social Connections: Not on file  Family History: The patient's ***family history includes Cancer in his brother and daughter; Heart disease in his brother, brother, and sister; Hypertension in his son; Stroke in his father and mother.  ROS:   Please see the history of present illness.    *** All other systems reviewed and are negative.  EKGs/Labs/Other Studies Reviewed:    The following studies were reviewed today: CT 08/19/20: IMPRESSION: Atrophy with periventricular small vessel disease. Prior small infarcts in each thalamus as well as in the anterior limb of the left external capsule. No acute infarct is evident. No mass or hemorrhage.  There are foci of arterial vascular  calcification. There are foci of paranasal sinus disease.  CT abdomen/pelvis: FINDINGS: Lower chest: Visualized lung bases are unremarkable. Moderate size sliding-type hiatal hernia is noted.  Hepatobiliary: No focal liver abnormality is seen. No gallstones, gallbladder wall thickening, or biliary dilatation.  Pancreas: Unremarkable. No pancreatic ductal dilatation or surrounding inflammatory changes.  Spleen: Normal in size without focal abnormality.  Adrenals/Urinary Tract: Adrenal glands are unremarkable. Kidneys are normal, without renal calculi, focal lesion, or hydronephrosis. Bladder is unremarkable.  Stomach/Bowel: There is no evidence of bowel obstruction or inflammation. Diverticulosis of descending and sigmoid colon is noted without inflammation. The appendix is not visualized.  Vascular/Lymphatic: Aortic atherosclerosis. No enlarged abdominal or pelvic lymph nodes.  Reproductive: Prostate is unremarkable.  Other: Small fat containing periumbilical hernia is noted. No ascites is noted.  Musculoskeletal: Multiple small lucencies are noted throughout the pelvis and visualized proximal femurs concerning for possible lytic lesions related to multiple myeloma or metastatic disease.  IMPRESSION: 1. Moderate size sliding-type hiatal hernia. 2. Diverticulosis of descending and sigmoid colon is noted without inflammation. 3. Small fat containing periumbilical hernia. 4. Multiple small lucencies are noted throughout the pelvis and visualized proximal femurs concerning for possible lytic lesions related to multiple myeloma or metastatic disease. MRI may be performed for further evaluation.  MRI 08/19/20: FINDINGS: Brain: No evidence of acute infarct. Remote infarct in the lateral right temporal lobe. Additional remote lacunar infarcts involving the bilateral thalami and bilateral cerebellar hemispheres. Additional small T2 hyperintensities in the basal  ganglia most likely represent dilated perivascular spaces. There is advanced/confluent T2/FLAIR hyperintensities within the white matter and pons, most likely related to chronic microvascular ischemic disease. Punctate area of susceptibility artifact in the right cerebellar hemisphere, likely related to prior microhemorrhage. Mild generalized cerebral volume loss. No hydrocephalus. Cavum septum pellucidum et vergae, anatomic variant. No acute hemorrhage. No mass lesion or abnormal mass effect. No extra-axial fluid collection.  Vascular: Major arterial flow voids are maintained at the skull base.  Skull and upper cervical spine: Normal marrow signal. Partially imaged upper cervical spine degenerative change.  Sinuses/Orbits: Right maxillary sinus retention cyst versus polyp. Scattered ethmoid air cell mucosal thickening without air-fluid levels. Unremarkable orbits.  Other: No sizable mastoid effusions.  IMPRESSION: 1. No evidence of acute intracranial abnormality. No acute infarct. 2. Remote right temporal lobe and bilateral basal ganglia and cerebellar infarcts. 3. Advanced chronic microvascular ischemic disease.  TTE 08/07/20: IMPRESSIONS  1. Left ventricular ejection fraction, by estimation, is 60 to 65%. The  left ventricle has normal function. The left ventricle has no regional  wall motion abnormalities. There is moderate left ventricular hypertrophy  of the basal-septal segment. Left  ventricular diastolic parameters are consistent with Grade I diastolic  dysfunction (impaired relaxation). Elevated left ventricular end-diastolic  pressure.  2. Right ventricular systolic function is mildly reduced. The right  ventricular size is  normal.  3. The mitral valve is degenerative. Mild mitral valve regurgitation. No  evidence of mitral stenosis. Moderate mitral annular calcification.  4. The aortic valve is normal in structure. There is mild calcification  of the  aortic valve. There is mild thickening of the aortic valve. Aortic  valve regurgitation is not visualized. Mild to moderate aortic valve  sclerosis/calcification is present,  without any evidence of aortic stenosis.  5. Aortic dilatation noted. There is mild dilatation of the aortic root,  measuring 40 mm. There is mild dilatation of the ascending aorta,  measuring 43 mm.   EKG:  EKG is *** ordered today.  The ekg ordered today demonstrates ***  Recent Labs: 07/17/2020: TSH 2.91 09/08/2020: Magnesium 1.7 10/11/2020: ALT 21; BUN 21; Creatinine, Ser 0.97; Hemoglobin 10.9; Platelets 158; Potassium 4.0; Sodium 139  Recent Lipid Panel    Component Value Date/Time   CHOL 90 08/31/2020 0523   TRIG 108 08/31/2020 0523   HDL 31 (L) 08/31/2020 0523   CHOLHDL 2.9 08/31/2020 0523   VLDL 22 08/31/2020 0523   LDLCALC 37 08/31/2020 0523     Risk Assessment/Calculations:   {Does this patient have ATRIAL FIBRILLATION?[phone]}   Physical Exam:    VS:  There were no vitals taken for this visit.    Wt Readings from Last 3 Encounters:  10/16/20 172 lb (78 kg)  10/01/20 168 lb 12.8 oz (76.6 kg)  08/31/20 171 lb 4.8 oz (77.7 kg)     GEN: *** Well nourished, well developed in no acute distress HEENT: Normal NECK: No JVD; No carotid bruits LYMPHATICS: No lymphadenopathy CARDIAC: ***RRR, no murmurs, rubs, gallops RESPIRATORY:  Clear to auscultation without rales, wheezing or rhonchi  ABDOMEN: Soft, non-tender, non-distended MUSCULOSKELETAL:  No edema; No deformity  SKIN: Warm and dry NEUROLOGIC:  Alert and oriented x 3 PSYCHIATRIC:  Normal affect   ASSESSMENT:    No diagnosis found. PLAN:    In order of problems listed above:  #Multivessel CAD s/p CABG in 2001: Patient with history of multiple MI's in the past with initial occurring at age 25. Had several subsequent PCIs (thinks he has 10 stents) and then suffered MI in 2001 prompting bypass surgery. Has been doing well  post-bypass with no further chest pain or SOB. He has not required PCI since his bypass surgery. Has been followed by Cardiology in [place], [place] prior to transferring care here.  -TTE with LVEF 60-65%, no WMA -Stop ASA and start apixaban -Continue rampipril 626m daily -Continue crestor 222mdaily -Continue coreg 12.26m74mID  #HTN: Patient is on a number of anti-hypertensives that I am concerned about in an elderly male with history of falls. Has been having blood pressure swings at home. Goal is to titrate off clonidine and possibly chlorthalidone and maintain on BB, ACE/ARB +/- spiro given history of CAD. Would prefer amlo over chlorthalidone. Will do this with the assistance of HTN clinic. -Continue chlorthalidone 226m52mily -Wean off clonidine -Continue coreg 12.26mg 38m -Continue ramipril10mg 59my  #Suspected SSS s/p PPM placement: Medtronic PPM functioning well. -Follow-up with EP as scheduled  #Episodic Right sided weakness: Patient with episodes of right sided weakness after sleeping on that side. Suspect secondary to nerve compression as improves with getting up and moving in the morning. MRI head without acute stroke. -Discussed trying to relieve pressure points to prevent nerve compression -Will be working with PT who may help  -MRI head negative for acute pathology  #HLD: -Continue crestor 20mg d57m  #DMII  on insulin: -Management per PCP  #History ofPEon Warfarin: -Continue warfarin -Managed by PCP  #Prior CVA: -Continue crestor 72m daily -Continue warfarin -Continue ASA for now as above   {Are you ordering a CV Procedure (e.g. stress test, cath, DCCV, TEE, etc)?   Press F2        :[id number]}   Medication Adjustments/Labs and Tests Ordered: Current medicines are reviewed at length with the patient today.  Concerns regarding medicines are outlined above.  No orders of the defined types were placed in this encounter.  No orders of the defined  types were placed in this encounter.   There are no Patient Instructions on file for this visit.   Signed, HFreada Bergeron MD  11/23/2020 3:14 PM    [facility]

## 2020-11-25 DIAGNOSIS — E1169 Type 2 diabetes mellitus with other specified complication: Secondary | ICD-10-CM | POA: Diagnosis not present

## 2020-11-25 DIAGNOSIS — E1122 Type 2 diabetes mellitus with diabetic chronic kidney disease: Secondary | ICD-10-CM | POA: Diagnosis not present

## 2020-11-25 DIAGNOSIS — I69392 Facial weakness following cerebral infarction: Secondary | ICD-10-CM | POA: Diagnosis not present

## 2020-11-25 DIAGNOSIS — I2510 Atherosclerotic heart disease of native coronary artery without angina pectoris: Secondary | ICD-10-CM | POA: Diagnosis not present

## 2020-11-25 DIAGNOSIS — I69320 Aphasia following cerebral infarction: Secondary | ICD-10-CM | POA: Diagnosis not present

## 2020-11-25 DIAGNOSIS — I69351 Hemiplegia and hemiparesis following cerebral infarction affecting right dominant side: Secondary | ICD-10-CM | POA: Diagnosis not present

## 2020-11-25 DIAGNOSIS — E1165 Type 2 diabetes mellitus with hyperglycemia: Secondary | ICD-10-CM | POA: Diagnosis not present

## 2020-11-25 DIAGNOSIS — E785 Hyperlipidemia, unspecified: Secondary | ICD-10-CM | POA: Diagnosis not present

## 2020-11-25 DIAGNOSIS — N400 Enlarged prostate without lower urinary tract symptoms: Secondary | ICD-10-CM | POA: Diagnosis not present

## 2020-11-25 DIAGNOSIS — M150 Primary generalized (osteo)arthritis: Secondary | ICD-10-CM | POA: Diagnosis not present

## 2020-11-25 DIAGNOSIS — D631 Anemia in chronic kidney disease: Secondary | ICD-10-CM | POA: Diagnosis not present

## 2020-11-25 DIAGNOSIS — I951 Orthostatic hypotension: Secondary | ICD-10-CM | POA: Diagnosis not present

## 2020-11-25 DIAGNOSIS — E039 Hypothyroidism, unspecified: Secondary | ICD-10-CM | POA: Diagnosis not present

## 2020-11-25 DIAGNOSIS — N189 Chronic kidney disease, unspecified: Secondary | ICD-10-CM | POA: Diagnosis not present

## 2020-11-25 DIAGNOSIS — H9190 Unspecified hearing loss, unspecified ear: Secondary | ICD-10-CM | POA: Diagnosis not present

## 2020-11-25 DIAGNOSIS — I1310 Hypertensive heart and chronic kidney disease without heart failure, with stage 1 through stage 4 chronic kidney disease, or unspecified chronic kidney disease: Secondary | ICD-10-CM | POA: Diagnosis not present

## 2020-11-27 ENCOUNTER — Ambulatory Visit: Payer: Medicare Other | Admitting: Cardiology

## 2020-11-27 ENCOUNTER — Other Ambulatory Visit: Payer: Self-pay

## 2020-11-27 ENCOUNTER — Encounter: Payer: Self-pay | Admitting: Cardiology

## 2020-11-27 VITALS — BP 144/82 | HR 72 | Ht 68.0 in | Wt 169.6 lb

## 2020-11-27 DIAGNOSIS — I639 Cerebral infarction, unspecified: Secondary | ICD-10-CM

## 2020-11-27 DIAGNOSIS — E7800 Pure hypercholesterolemia, unspecified: Secondary | ICD-10-CM

## 2020-11-27 DIAGNOSIS — I495 Sick sinus syndrome: Secondary | ICD-10-CM | POA: Diagnosis not present

## 2020-11-27 DIAGNOSIS — E1165 Type 2 diabetes mellitus with hyperglycemia: Secondary | ICD-10-CM

## 2020-11-27 DIAGNOSIS — Z951 Presence of aortocoronary bypass graft: Secondary | ICD-10-CM

## 2020-11-27 DIAGNOSIS — I10 Essential (primary) hypertension: Secondary | ICD-10-CM | POA: Diagnosis not present

## 2020-11-27 DIAGNOSIS — I951 Orthostatic hypotension: Secondary | ICD-10-CM

## 2020-11-27 DIAGNOSIS — I25119 Atherosclerotic heart disease of native coronary artery with unspecified angina pectoris: Secondary | ICD-10-CM

## 2020-11-27 DIAGNOSIS — Z86711 Personal history of pulmonary embolism: Secondary | ICD-10-CM

## 2020-11-27 MED ORDER — APIXABAN 5 MG PO TABS: 5.0000 mg | ORAL_TABLET | Freq: Two times a day (BID) | ORAL | 3 refills | Status: DC

## 2020-11-27 NOTE — Progress Notes (Signed)
Cardiology Office Note:    Date:  11/27/2020   ID:  Thomas [name], DOB [birth year], MRN [id number]  PCP:  Vivi Barrack, MD   [facility] Providers Cardiologist:  None {    Referring MD: Vivi Barrack, MD    History of Present Illness:    Thomas [name] is a 87 y.o. male with a hx of  HLD, DMII, prior CVA, HTN, CAD s/p CABG in 2001, SSS s/p PPM, pulmonary embolism on warfarin, orthostatic hypotension and hypothyroidism who returns to clinic for follow-up.  Patient has a history of MI at age 72that wasmanaged medically at that time. He had subsequent PCIs and MI in 2001 leading to bypass surgery. He states he has had no chest pain or shortness of breath since his surgery. No stents placed after bypass surgery. Blood pressures fluctuate significantly at home from 90/50s to 200s/100s.Was seen in HTN clinic and where his clonidine was weaned, pindolol was changed to coreg; ramipril and chlorthalidone were continued at current doses. Also saw Dr. Lovena Le with EP to manage his medtronic PPM which was functioning well.   TTE showed LVEF 60-65%, moderate basal-septal hypertrophy, G1DD, dilated aorta/aortic root, moderate aortic sclerosis without stenosis, moderate MAC, mild MR.  Had recent fall where he was seen in ED in 07/2020. CT head negative for acute pathology. MRI head with remote strokes (known) but no acute pathology.   Was seen on 10/11/20 for syncope thought to be due to coreg. He was recommended for admission but he did not want to stay. His coreg was decreased.   Today, he is accompanied by his son, who also provides some history. In early March he had a mild stroke as detailed above. During that period, he was having frequent lows blood pressures and he was weaned off the vast majority of his blood pressure medications. He was also notably orthostatic during that admission with concern that this may have contributed to his stroke. He was started on florinef at that time and  continued on his coreg. After discharged, the patient unfortunately contracted COVID (~55monthago). His symptoms were mild but he had lingering fatigue.   Otherwise, the patient and his son state that his home blood pressure has been stable, and he has not had episodes of "bottoming out" lately. He also notes his blood pressure has not reached 190+ for a long while. We discussed at length his medication options for blood pressure management as well as [facility]. He is hesitant to discontinue Warfarin and aspirin but we discussed how this would be a safer option for him given risk of bleeding with his frequent falls.   He denies any chest pain, shortness of breath, palpitations, or exertional symptoms. No headaches, lightheadedness, or syncope to report. Also has no lower extremity edema, orthopnea or PND.   Past Medical History:  Diagnosis Date  . Arthritis   . [place]ting disorder ([place]   . Coronary artery disease   . Diabetes ([place]   . Hyperlipidemia   . Hypertension   . Hypothyroidism 07/17/2020  . Kidney disease   . Loss of hearing   . Macular degeneration   . Myocardial infarction ([place]   . Osteoarthritis   . Stroke ([facility]     Past Surgical History:  Procedure Laterality Date  . CARPAL TUNNEL RELEASE  2011  . CORONARY ANGIOPLASTY WITH STENT PLACEMENT    . CORONARY ARTERY BYPASS GRAFT  2001   x6  . REPLACEMENT TOTAL KNEE    .  SPINE SURGERY  2008   cervical and lumbar  . THYROIDECTOMY      Current Medications: Current Meds  Medication Sig  . acetaminophen (TYLENOL) 325 MG tablet Take 650 mg by mouth every 6 (six) hours as needed for moderate pain or headache.  Marland Kitchen apixaban (ELIQUIS) 5 MG TABS tablet Take 1 tablet (5 mg total) by mouth 2 (two) times daily.  Marland Kitchen b complex vitamins capsule Take 1 capsule by mouth daily.  . cholecalciferol (VITAMIN D3) 25 MCG (1000 UNIT) tablet Take 1,000 Units by mouth daily.  Marland Kitchen co-enzyme Q-10 50 MG capsule Take 50 mg by mouth daily.  .  ferrous sulfate 325 (65 FE) MG EC tablet Take 1 tablet (325 mg total) by mouth daily with breakfast.  . glucosamine-chondroitin 500-400 MG tablet Take 1 tablet by mouth 3 (three) times daily.  . insulin glargine (LANTUS) 100 unit/mL SOPN Inject 15 Units into the skin daily.  . Insulin Pen Needle 31G X 5 MM MISC 1 each by Does not apply route daily. Please use a new pen needle daily with Lantus injection  . levothyroxine (SYNTHROID) 100 MCG tablet TAKE 1 TABLET(100 MCG) BY MOUTH DAILY  . mirtazapine (REMERON) 7.5 MG tablet Take 7.5 mg by mouth at bedtime.  . Multiple Vitamin (MULTIVITAMIN) tablet Take 1 tablet by mouth daily.  . Red Yeast Rice Extract (RED YEAST RICE PO) Take 1 capsule by mouth daily.  . rosuvastatin (CRESTOR) 20 MG tablet Take 1 tablet (20 mg total) by mouth at bedtime.  . tamsulosin (FLOMAX) 0.4 MG CAPS capsule Take 0.4 mg by mouth daily after supper.  . [DISCONTINUED] aspirin 81 MG chewable tablet Chew 81 mg by mouth daily.  . [DISCONTINUED] carvedilol (COREG) 12.5 MG tablet Take 0.5 tablets (6.25 mg total) by mouth 2 (two) times daily.  . [DISCONTINUED] fludrocortisone (FLORINEF) 0.1 MG tablet Take 0.5 tablets (0.05 mg total) by mouth daily.  . [DISCONTINUED] JANTOVEN 4 MG tablet Take 4 mg by mouth See admin instructions. Monday,Wednesday,Friday and sunday  . [DISCONTINUED] warfarin (COUMADIN) 3 MG tablet TAKE 1 TABLET BY MOUTH DAILY ON TUESDAY, THURSDAY, AND SATURDAY     Allergies:   Amitriptyline, Effexor xr [venlafaxine], Lyrica [pregabalin], Morphine, Morphine and related, Pravachol [pravastatin], and Zolpidem   Social History   Socioeconomic History  . Marital status: Married    Spouse name: Not on file  . Number of children: Not on file  . Years of education: Not on file  . Highest education level: Not on file  Occupational History  . Not on file  Tobacco Use  . Smoking status: Never Smoker  . Smokeless tobacco: Never Used  Substance and Sexual Activity  .  Alcohol use: Never  . Drug use: Never  . Sexual activity: Not on file  Other Topics Concern  . Not on file  Social History Narrative  . Not on file   Social Determinants of Health   Financial Resource Strain: Not on file  Food Insecurity: Not on file  Transportation Needs: Not on file  Physical Activity: Not on file  Stress: Not on file  Social Connections: Not on file     Family History: The patient's family history includes Cancer in his brother and daughter; Heart disease in his brother, brother, and sister; Hypertension in his son; Stroke in his father and mother.  ROS:   Review of Systems  Constitutional: Positive for malaise/fatigue. Negative for chills and fever.  HENT: Negative for ear pain and hearing loss.  Eyes: Negative for photophobia and pain.  Respiratory: Negative for cough and hemoptysis.   Cardiovascular: Negative for chest pain, palpitations, orthopnea, claudication, leg swelling and PND.  Gastrointestinal: Negative for blood in stool and diarrhea.  Genitourinary: Negative for frequency and hematuria.  Musculoskeletal: Negative for myalgias and neck pain.  Neurological: Negative for dizziness, tingling and weakness.  Endo/Heme/Allergies: Does not bruise/bleed easily.  Psychiatric/Behavioral: Negative for hallucinations. The patient does not have insomnia.      EKGs/Labs/Other Studies Reviewed:    The following studies were reviewed today:  CT September 10, 2020: IMPRESSION: Atrophy with periventricular small vessel disease. Prior small infarcts in each thalamus as well as in the anterior limb of the left external capsule. No acute infarct is evident. No mass or hemorrhage.  There are foci of arterial vascular calcification. There are foci of paranasal sinus disease.  CT abdomen/pelvis: FINDINGS: Lower chest: Visualized lung bases are unremarkable. Moderate size sliding-type hiatal hernia is noted.  Hepatobiliary: No focal liver abnormality is seen.  No gallstones, gallbladder wall thickening, or biliary dilatation.  Pancreas: Unremarkable. No pancreatic ductal dilatation or surrounding inflammatory changes.  Spleen: Normal in size without focal abnormality.  Adrenals/Urinary Tract: Adrenal glands are unremarkable. Kidneys are normal, without renal calculi, focal lesion, or hydronephrosis. Bladder is unremarkable.  Stomach/Bowel: There is no evidence of bowel obstruction or inflammation. Diverticulosis of descending and sigmoid colon is noted without inflammation. The appendix is not visualized.  Vascular/Lymphatic: Aortic atherosclerosis. No enlarged abdominal or pelvic lymph nodes.  Reproductive: Prostate is unremarkable.  Other: Small fat containing periumbilical hernia is noted. No ascites is noted.  Musculoskeletal: Multiple small lucencies are noted throughout the pelvis and visualized proximal femurs concerning for possible lytic lesions related to multiple myeloma or metastatic disease.  IMPRESSION: 1. Moderate size sliding-type hiatal hernia. 2. Diverticulosis of descending and sigmoid colon is noted without inflammation. 3. Small fat containing periumbilical hernia. 4. Multiple small lucencies are noted throughout the pelvis and visualized proximal femurs concerning for possible lytic lesions related to multiple myeloma or metastatic disease. MRI may be performed for further evaluation.  MRI 09-10-20: FINDINGS: Brain: No evidence of acute infarct. Remote infarct in the lateral right temporal lobe. Additional remote lacunar infarcts involving the bilateral thalami and bilateral cerebellar hemispheres. Additional small T2 hyperintensities in the basal ganglia most likely represent dilated perivascular spaces. There is advanced/confluent T2/FLAIR hyperintensities within the white matter and pons, most likely related to chronic microvascular ischemic disease. Punctate area of susceptibility artifact  in the right cerebellar hemisphere, likely related to prior microhemorrhage. Mild generalized cerebral volume loss. No hydrocephalus. Cavum septum pellucidum et vergae, anatomic variant. No acute hemorrhage. No mass lesion or abnormal mass effect. No extra-axial fluid collection.  Vascular: Major arterial flow voids are maintained at the skull base.  Skull and upper cervical spine: Normal marrow signal. Partially imaged upper cervical spine degenerative change.  Sinuses/Orbits: Right maxillary sinus retention cyst versus polyp. Scattered ethmoid air cell mucosal thickening without air-fluid levels. Unremarkable orbits.  Other: No sizable mastoid effusions.  IMPRESSION: 1. No evidence of acute intracranial abnormality. No acute infarct. 2. Remote right temporal lobe and bilateral basal ganglia and cerebellar infarcts. 3. Advanced chronic microvascular ischemic disease.  TTE 08/07/20: IMPRESSIONS  1. Left ventricular ejection fraction, by estimation, is 60 to 65%. The  left ventricle has normal function. The left ventricle has no regional  wall motion abnormalities. There is moderate left ventricular hypertrophy  of the basal-septal segment. Left  ventricular diastolic parameters are consistent with  Grade I diastolic  dysfunction (impaired relaxation). Elevated left ventricular end-diastolic  pressure.  2. Right ventricular systolic function is mildly reduced. The right  ventricular size is normal.  3. The mitral valve is degenerative. Mild mitral valve regurgitation. No  evidence of mitral stenosis. Moderate mitral annular calcification.  4. The aortic valve is normal in structure. There is mild calcification  of the aortic valve. There is mild thickening of the aortic valve. Aortic  valve regurgitation is not visualized. Mild to moderate aortic valve  sclerosis/calcification is present,  without any evidence of aortic stenosis.  5. Aortic dilatation noted. There  is mild dilatation of the aortic root,  measuring 40 mm. There is mild dilatation of the ascending aorta,  measuring 43 mm.   EKG:   11/27/2020: EKG is not ordered today.  Recent Labs: 07/17/2020: TSH 2.91 09/08/2020: Magnesium 1.7 10/11/2020: ALT 21; BUN 21; Creatinine, Ser 0.97; Hemoglobin 10.9; Platelets 158; Potassium 4.0; Sodium 139  Recent Lipid Panel    Component Value Date/Time   CHOL 90 08/31/2020 0523   TRIG 108 08/31/2020 0523   HDL 31 (L) 08/31/2020 0523   CHOLHDL 2.9 08/31/2020 0523   VLDL 22 08/31/2020 0523   LDLCALC 37 08/31/2020 0523     Risk Assessment/Calculations:       Physical Exam:    VS:  BP (!) 144/82   Pulse 72   Ht _0  (1.727 m)   Wt 169 lb 9.6 oz (76.9 kg)   SpO2 98%   BMI 25.79 kg/m     Wt Readings from Last 3 Encounters:  11/27/20 169 lb 9.6 oz (76.9 kg)  10/16/20 172 lb (78 kg)  10/01/20 168 lb 12.8 oz (76.6 kg)     GEN: Well nourished, well developed in no acute distress HEENT: Normal NECK: No JVD; No carotid bruits LYMPHATICS: No lymphadenopathy CARDIAC: RRR, no murmurs, rubs, gallops RESPIRATORY:  Clear to auscultation without rales, wheezing or rhonchi  ABDOMEN: Soft, non-tender, non-distended MUSCULOSKELETAL:  No edema; No deformity  SKIN: Warm and dry NEUROLOGIC:  Alert and oriented x 3 PSYCHIATRIC:  Normal affect   ASSESSMENT:    1. Coronary artery disease involving native heart with angina pectoris, unspecified vessel or lesion type ([place])   2. Essential hypertension   3. Sick sinus syndrome ([place])   4. Pure hypercholesterolemia   5. Type 2 diabetes mellitus with hyperglycemia, unspecified whether long term insulin use ([place])   6. Hx of pulmonary embolus   7. Cerebrovascular accident (CVA), unspecified mechanism ([place])   8. H/O coronary artery bypass surgery   9. Orthostatic hypotension    PLAN:    In order of problems listed above:  #Multivessel CAD s/p CABG in 2001: Patient with history of multiple MI's in the  past with initial occurring at age 69. Had several subsequent PCIs (thinks he has 10 stents) and then suffered MI in 2001 prompting bypass surgery. Has been doing well post-bypass with no further chest pain or SOB. He has not required PCI since his bypass surgery. [place] to [place] care here.  -TTE with LVEF 60-65%, no WMA -Stop aspirin given need for [facility] and more frequent falls -Weaned off all blood pressure medications given orthostatic hypotension with falls and recent stroke; will monitor at home  #HTN: #Orthostatic Hypotension: Now with episodes of hypotension. Weaned off of medications due to frequent lows with syncope. Rather than have two medications aimed at doing opposite things (florinef and  coreg), will stop both and monitor blood pressure. Will tolerate higher pressures to avoid hypotension. -Stop coreg and florinef and monitor -Will tolerate higher pressures to avoid lows  #Suspected SSS s/p PPM placement: Medtronic PPM functioning well. -Follow-up with EP as scheduled  #Recent Stroke: Thought to be possibly secondary to low blood pressures. MRI with chronic ischemic changes.  -Continue crestor -Will stop ASA given need for AC and high bleed risk  #HLD: -Continue crestor 68m daily  #DMII on insulin: -Management per PCP  #History ofPE: Agree with changing to NOAC. Patient hesitant to do so but we discussed at length how this will decrease his risk of bleeding and require no further monitoring. Patient amenable to try. -Switch to apixaban; AC managed by PCP-->patient's son will call and initiate the transition   Follow-up in 2-3 months.  Medication Adjustments/Labs and Tests Ordered: Current medicines are reviewed at length with the patient today.  Concerns regarding medicines are outlined above.  No orders of the defined types were placed in this encounter.  Meds ordered this encounter  Medications  .  apixaban (ELIQUIS) 5 MG TABS tablet    Sig: Take 1 tablet (5 mg total) by mouth 2 (two) times daily.    Dispense:  180 tablet    Refill:  3    Patient Instructions  Medication Instructions:  Stop Coumadin Start Eliquis Stop Coreg Stop Florinef Stop Aspirin *If you need a refill on your cardiac medications before your next appointment, please call your pharmacy*   Lab Work: none If you have labs (blood work) drawn today and your tests are completely normal, you will receive your results only by: .Marland KitchenMyChart Message (if you have MyChart) OR . A paper copy in the mail If you have any lab test that is abnormal or we need to change your treatment, we will call you to review the results.   Testing/Procedures: none   Follow-Up: [facility] you and your health needs are our priority.  As part of our continuing mission to provide you with exceptional heart care, we have created designated Provider Care Teams.  These Care Teams include your primary Cardiologist (physician) and Advanced Practice Providers (APPs -  Physician Assistants and Nurse Practitioners) who all work together to provide you with the care you need, when you need it.  We recommend signing up for the patient portal called "MyChart".  Sign up information is provided on this After Visit Summary.  MyChart is used to connect with patients for Virtual Visits (Telemedicine).  Patients are able to view lab/test results, encounter notes, upcoming appointments, etc.  Non-urgent messages can be sent to your provider as well.   To learn more about what you can do with MyChart, go to [email or web address]    Your next appointment:   2-3 month(s)  The format for your next appointment:   In Person  Provider:   HGwyndolyn Kaufman MD   Other Instructions     I,Mathew Stumpf,acting as a scribe for HFreada Bergeron MD.,have documented all relevant documentation on the behalf of HFreada Bergeron MD,as directed by   HFreada Bergeron MD while in the presence of HFreada Bergeron MD.  I, HFreada Bergeron MD, have reviewed all documentation for this visit. The documentation on 11/27/20 for the exam, diagnosis, procedures, and orders are all accurate and complete.  Signed, HFreada Bergeron MD  11/27/2020 3:52 PM     [facility]

## 2020-11-27 NOTE — Patient Instructions (Signed)
Medication Instructions:  Stop Coumadin Start Eliquis Stop Coreg Stop Florinef Stop Aspirin *If you need a refill on your cardiac medications before your next appointment, please call your pharmacy*   Lab Work: none If you have labs (blood work) drawn today and your tests are completely normal, you will receive your results only by: Marland Kitchen MyChart Message (if you have MyChart) OR . A paper copy in the mail If you have any lab test that is abnormal or we need to change your treatment, we will call you to review the results.   Testing/Procedures: none   Follow-Up: At [facility], you and your health needs are our priority.  As part of our continuing mission to provide you with exceptional heart care, we have created designated Provider Care Teams.  These Care Teams include your primary Cardiologist (physician) and Advanced Practice Providers (APPs -  Physician Assistants and Nurse Practitioners) who all work together to provide you with the care you need, when you need it.  We recommend signing up for the patient portal called "MyChart".  Sign up information is provided on this After Visit Summary.  MyChart is used to connect with patients for Virtual Visits (Telemedicine).  Patients are able to view lab/test results, encounter notes, upcoming appointments, etc.  Non-urgent messages can be sent to your provider as well.   To learn more about what you can do with MyChart, go to [email or web address].    Your next appointment:   2-3 month(s)  The format for your next appointment:   In Person  Provider:   Laurance Flatten, MD   Other Instructions

## 2020-11-28 ENCOUNTER — Encounter: Payer: Self-pay | Admitting: Family Medicine

## 2020-11-28 NOTE — Telephone Encounter (Signed)
Please schedule pt with Coumadin Clinic.   Thank You!

## 2020-12-01 NOTE — Telephone Encounter (Signed)
LVM to call back to get scheduled.  

## 2020-12-09 ENCOUNTER — Telehealth: Payer: Self-pay

## 2020-12-09 ENCOUNTER — Other Ambulatory Visit: Payer: Self-pay

## 2020-12-09 MED ORDER — MIRTAZAPINE 7.5 MG PO TABS: 7.5000 mg | ORAL_TABLET | Freq: Every day | ORAL | 0 refills | Status: DC

## 2020-12-09 NOTE — Telephone Encounter (Signed)
Medication sent to pharmacy  

## 2020-12-09 NOTE — Telephone Encounter (Signed)
.   LAST APPOINTMENT DATE: 11/14/2020   NEXT APPOINTMENT DATE:@6 /15/2022  MEDICATION:mirtazapine (REMERON) 7.5 MG tablet  PHARMACY:[id number] - [place], [place] - [address] [place] [address]

## 2020-12-10 ENCOUNTER — Ambulatory Visit (INDEPENDENT_AMBULATORY_CARE_PROVIDER_SITE_OTHER): Payer: Medicare Other | Admitting: General Practice

## 2020-12-10 ENCOUNTER — Other Ambulatory Visit: Payer: Self-pay

## 2020-12-10 DIAGNOSIS — Z7901 Long term (current) use of anticoagulants: Secondary | ICD-10-CM

## 2020-12-10 LAB — POCT INR: INR: 5.3 — AB (ref 2.0–3.0)

## 2020-12-10 NOTE — Progress Notes (Signed)
I have reviewed the patient's encounter and agree with the documentation.  Katina Degree. [name], MD 12/10/2020 11:10 AM

## 2020-12-10 NOTE — Patient Instructions (Addendum)
Pre visit review using our clinic review tool, if applicable. No additional management support is needed unless otherwise documented below in the visit note.  Stop taking the warfarin Sophronia Simas).  Patient will check INR on Friday.  Patient will be transitioning from warfarin to Eliquis.  INR today is 5.3.  *Do not start taking Eliquis until INR is < 2.0.  Go to ER if any unusual bleeding occurs.

## 2020-12-12 LAB — POCT INR: INR: 2.3 (ref 2.0–3.0)

## 2020-12-15 ENCOUNTER — Ambulatory Visit: Payer: Medicare Other

## 2020-12-17 ENCOUNTER — Ambulatory Visit (INDEPENDENT_AMBULATORY_CARE_PROVIDER_SITE_OTHER): Payer: Medicare Other | Admitting: General Practice

## 2020-12-17 DIAGNOSIS — Z7901 Long term (current) use of anticoagulants: Secondary | ICD-10-CM

## 2020-12-17 NOTE — Progress Notes (Signed)
I have reviewed the patient's encounter and agree with the documentation.  Thomas [name]. [name], MD 12/17/2020 10:11 AM

## 2020-12-17 NOTE — Patient Instructions (Signed)
Pre visit review using our clinic review tool, if applicable. No additional management support is needed unless otherwise documented below in the visit note.  Skip dosage today and start Eliquis tomorrow (6/18).

## 2020-12-23 DIAGNOSIS — I69392 Facial weakness following cerebral infarction: Secondary | ICD-10-CM | POA: Diagnosis not present

## 2020-12-23 DIAGNOSIS — N400 Enlarged prostate without lower urinary tract symptoms: Secondary | ICD-10-CM | POA: Diagnosis not present

## 2020-12-23 DIAGNOSIS — E1122 Type 2 diabetes mellitus with diabetic chronic kidney disease: Secondary | ICD-10-CM | POA: Diagnosis not present

## 2020-12-23 DIAGNOSIS — E785 Hyperlipidemia, unspecified: Secondary | ICD-10-CM | POA: Diagnosis not present

## 2020-12-23 DIAGNOSIS — I951 Orthostatic hypotension: Secondary | ICD-10-CM | POA: Diagnosis not present

## 2020-12-23 DIAGNOSIS — I2510 Atherosclerotic heart disease of native coronary artery without angina pectoris: Secondary | ICD-10-CM | POA: Diagnosis not present

## 2020-12-23 DIAGNOSIS — I1310 Hypertensive heart and chronic kidney disease without heart failure, with stage 1 through stage 4 chronic kidney disease, or unspecified chronic kidney disease: Secondary | ICD-10-CM | POA: Diagnosis not present

## 2020-12-23 DIAGNOSIS — H9190 Unspecified hearing loss, unspecified ear: Secondary | ICD-10-CM | POA: Diagnosis not present

## 2020-12-23 DIAGNOSIS — I69320 Aphasia following cerebral infarction: Secondary | ICD-10-CM | POA: Diagnosis not present

## 2020-12-23 DIAGNOSIS — M150 Primary generalized (osteo)arthritis: Secondary | ICD-10-CM | POA: Diagnosis not present

## 2020-12-23 DIAGNOSIS — E039 Hypothyroidism, unspecified: Secondary | ICD-10-CM | POA: Diagnosis not present

## 2020-12-23 DIAGNOSIS — I69351 Hemiplegia and hemiparesis following cerebral infarction affecting right dominant side: Secondary | ICD-10-CM | POA: Diagnosis not present

## 2020-12-23 DIAGNOSIS — E1165 Type 2 diabetes mellitus with hyperglycemia: Secondary | ICD-10-CM | POA: Diagnosis not present

## 2020-12-23 DIAGNOSIS — D631 Anemia in chronic kidney disease: Secondary | ICD-10-CM | POA: Diagnosis not present

## 2020-12-23 DIAGNOSIS — E1169 Type 2 diabetes mellitus with other specified complication: Secondary | ICD-10-CM | POA: Diagnosis not present

## 2020-12-23 DIAGNOSIS — N189 Chronic kidney disease, unspecified: Secondary | ICD-10-CM | POA: Diagnosis not present

## 2020-12-31 ENCOUNTER — Encounter: Payer: Self-pay | Admitting: Family Medicine

## 2020-12-31 ENCOUNTER — Other Ambulatory Visit: Payer: Self-pay

## 2020-12-31 ENCOUNTER — Ambulatory Visit (INDEPENDENT_AMBULATORY_CARE_PROVIDER_SITE_OTHER): Payer: Medicare Other | Admitting: Family Medicine

## 2020-12-31 VITALS — BP 98/59 | HR 72 | Temp 96.7°F | Ht 68.0 in | Wt 162.0 lb

## 2020-12-31 DIAGNOSIS — I10 Essential (primary) hypertension: Secondary | ICD-10-CM

## 2020-12-31 DIAGNOSIS — E1165 Type 2 diabetes mellitus with hyperglycemia: Secondary | ICD-10-CM | POA: Diagnosis not present

## 2020-12-31 DIAGNOSIS — R5381 Other malaise: Secondary | ICD-10-CM

## 2020-12-31 LAB — POCT GLYCOSYLATED HEMOGLOBIN (HGB A1C): Hemoglobin A1C: 7.6 % — AB (ref 4.0–5.6)

## 2020-12-31 NOTE — Assessment & Plan Note (Signed)
On the low side today but is asymptomatic.  He is concerned about elevated blood pressure readings in the 180s to 190s at home and restarted low-dose Coreg.  He has been tolerating well.  He will be following up again with cardiology next week.

## 2020-12-31 NOTE — Patient Instructions (Signed)
It was very nice to see you today!  Your A1c looks good.  We will continue all of your current medications.  I will see back in 3 months.  Please come back to see me sooner if needed.  Take care, Dr [name]  PLEASE NOTE:  If you had any lab tests please let us know if you have not heard back within a few days. You may see your results on mychart before we have a chance to review them but we will give you a call once they are reviewed by [place]. If we ordered any referrals today, please let us know if you have not heard from their office within the next week.   Please try these tips to maintain a healthy lifestyle:  Eat at least 3 REAL meals and 1-2 snacks per day.  Aim for no more than 5 hours between eating.  If you eat breakfast, please do so within one hour of getting up.   Each meal should contain half fruits/vegetables, one quarter protein, and one quarter carbs (no bigger than a computer mouse)  Cut down on sweet beverages. This includes juice, soda, and sweet tea.   Drink at least 1 glass of water with each meal and aim for at least 8 glasses per day  Exercise at least 150 minutes every week.

## 2020-12-31 NOTE — Assessment & Plan Note (Signed)
Doing well with home health PT.

## 2020-12-31 NOTE — Progress Notes (Signed)
   Thomas [name] is a 87 y.o. male who presents today for an office visit.  Assessment/Plan:  Chronic Problems Addressed Today: Debility Doing well with home health PT.  Type 2 diabetes mellitus with hyperglycemia ([name]) A1c 7.6.  No symptomatic lows.  We will continue Lantus 15 units daily.  Recheck again in 3 months.  Hypertension On the low side today but is asymptomatic.  He is concerned about elevated blood pressure readings in the 180s to 190s at home and restarted low-dose Coreg.  He has been tolerating well.  He will be following up again with cardiology next week.    Subjective:  HPI:   See A/p for status of chronic conditions.   Recently saw cardiology.  Concern for labile blood pressures.  Coreg and Florinef were both stopped at that time.  He was worried about elevated blood pressure readings.  Restarted Coreg at 6.25 mg twice daily.  He states that his home blood sugar readings have been normal, and denies having low blood sugar readings. He admits to taking 15 units of Lantus via injection. He notes bleeding in his gums, and also expresses concerns over appetite and weight loss.         Objective:  Physical Exam: BP (!) 98/59   Pulse 72   Temp (!) 96.7 F (35.9 C) (Temporal)   Ht 5\' 8"  (1.727 m)   Wt 162 lb (73.5 kg)   SpO2 98%   BMI 24.63 kg/m   Gen: No acute distress, resting comfortably Neuro: Grossly normal, moves all extremities Psych: Normal affect and thought content      I,Thomas [name],acting as a scribe for , MD.,have documented all relevant documentation on the behalf of Thomas Doe, MD,as directed by  Thomas Doe, MD while in the presence of Thomas Doe, MD.  I, Thomas Doe, MD, have reviewed all documentation for this visit. The documentation on 12/31/20 for the exam, diagnosis, procedures, and orders are all accurate and complete.  03/03/21. Katina Degree, MD 12/31/2020 11:17 AM

## 2020-12-31 NOTE — Assessment & Plan Note (Signed)
A1c 7.6.  No symptomatic lows.  We will continue Lantus 15 units daily.  Recheck again in 3 months.

## 2021-01-02 ENCOUNTER — Telehealth: Payer: Self-pay | Admitting: *Deleted

## 2021-01-02 NOTE — Telephone Encounter (Signed)
Received call from pt's wife, asking if pt's appt on 01/05/21 is necessary. She states it is very difficult to get him up and out of the house.  Please advise

## 2021-01-02 NOTE — Telephone Encounter (Signed)
TCT pt's wife, Jasmine December. No answer but was able to leave vm message to let her know that she does not need to bring her husband in for an appt on 01/05/21. Advised to call with any questions or concerns to [phone]

## 2021-01-04 ENCOUNTER — Other Ambulatory Visit: Payer: Self-pay | Admitting: Family Medicine

## 2021-01-05 ENCOUNTER — Inpatient Hospital Stay: Payer: Medicare Other | Admitting: Hematology and Oncology

## 2021-01-05 ENCOUNTER — Inpatient Hospital Stay: Payer: Medicare Other

## 2021-01-05 NOTE — Progress Notes (Deleted)
Cardiology Office Note:    Date:  01/05/2021   ID:  Thomas [name], DOB 11-05-1928, MRN [id number]  PCP:  Vivi Barrack, MD   [facility] Providers Cardiologist:  None {    Referring MD: Vivi Barrack, MD    History of Present Illness:    Thomas [name] is a 87 y.o. male with a hx of  HLD, DMII, prior CVA, HTN, CAD s/p CABG in 2001, SSS s/p PPM, pulmonary embolism on warfarin, orthostatic hypotension and hypothyroidism who returns to clinic for follow-up.  Patient has a history of MI at age 55 that was managed medically at that time. He had subsequent PCIs and MI in 2001 leading to bypass surgery. He states he has had no chest pain or shortness of breath since his surgery. No stents placed after bypass surgery. Blood pressures fluctuate significantly at home from 90/50s to 200s/100s. Was seen in HTN clinic and where his clonidine was weaned, pindolol was changed to coreg; ramipril and chlorthalidone were continued at current doses. Also saw Dr. Lovena Le with EP to manage his medtronic PPM which was functioning well.    TTE showed LVEF 60-65%, moderate basal-septal hypertrophy, G1DD, dilated aorta/aortic root, moderate aortic sclerosis without stenosis, moderate MAC, mild MR.   Had recent fall where he was seen in ED in 07/2020. CT head negative for acute pathology. MRI head with remote strokes (known) but no acute pathology.   Was seen on 10/11/20 for syncope thought to be due to coreg. He was recommended for admission but he did not want to stay. His coreg was decreased.   Last seen in clinic on 11/27/20. Had been off of many of his blood pressure medications due to orthostasis. He was also on floinef at that time. At that time, his blood pressure was relatively stable. Given his risk of falls, his warfarin was changed to apixaban and his aspirin was stopped.     Today,  Past Medical History:  Diagnosis Date   Arthritis    Clotting disorder ([place])    Coronary artery disease     Diabetes ([place])    Hyperlipidemia    Hypertension    Hypothyroidism 07/17/2020   Kidney disease    Loss of hearing    Macular degeneration    Myocardial infarction [facility] [place])    Osteoarthritis    Stroke [facility])     Past Surgical History:  Procedure Laterality Date   CARPAL TUNNEL RELEASE  2011   CORONARY ANGIOPLASTY WITH STENT PLACEMENT     CORONARY ARTERY BYPASS GRAFT  2001   x6   REPLACEMENT TOTAL KNEE     SPINE SURGERY  2008   cervical and lumbar   THYROIDECTOMY      Current Medications: No outpatient medications have been marked as taking for the 01/08/21 encounter (Appointment) with Freada Bergeron, MD.     Allergies:   Amitriptyline, Effexor xr [venlafaxine], Lyrica [pregabalin], Morphine, Morphine and related, Pravachol [pravastatin], and Zolpidem   Social History   Socioeconomic History   Marital status: Married    Spouse name: Not on file   Number of children: Not on file   Years of education: Not on file   Highest education level: Not on file  Occupational History   Not on file  Tobacco Use   Smoking status: Never   Smokeless tobacco: Never  Substance and Sexual Activity   Alcohol use: Never   Drug use: Never   Sexual activity: Not on file  Other Topics  Concern   Not on file  Social History Narrative   Not on file   Social Determinants of Health   Financial Resource Strain: Not on file  Food Insecurity: Not on file  Transportation Needs: Not on file  Physical Activity: Not on file  Stress: Not on file  Social Connections: Not on file     Family History: The patient's family history includes Cancer in his brother and daughter; Heart disease in his brother, brother, and sister; Hypertension in his son; Stroke in his father and mother.  ROS:   Review of Systems  Constitutional:  Positive for malaise/fatigue. Negative for chills and fever.  HENT:  Negative for ear pain and hearing loss.   Eyes:  Negative for photophobia and pain.  Respiratory:   Negative for cough and hemoptysis.   Cardiovascular:  Negative for chest pain, palpitations, orthopnea, claudication, leg swelling and PND.  Gastrointestinal:  Negative for blood in stool and diarrhea.  Genitourinary:  Negative for frequency and hematuria.  Musculoskeletal:  Negative for myalgias and neck pain.  Neurological:  Negative for dizziness, tingling and weakness.  Endo/Heme/Allergies:  Does not bruise/bleed easily.  Psychiatric/Behavioral:  Negative for hallucinations. The patient does not have insomnia.     EKGs/Labs/Other Studies Reviewed:    The following studies were reviewed today:  CT Sep 10, 2020: IMPRESSION: Atrophy with periventricular small vessel disease. Prior small infarcts in each thalamus as well as in the anterior limb of the left external capsule. No acute infarct is evident. No mass or hemorrhage.   There are foci of arterial vascular calcification. There are foci of paranasal sinus disease.   CT abdomen/pelvis: FINDINGS: Lower chest: Visualized lung bases are unremarkable. Moderate size sliding-type hiatal hernia is noted.   Hepatobiliary: No focal liver abnormality is seen. No gallstones, gallbladder wall thickening, or biliary dilatation.   Pancreas: Unremarkable. No pancreatic ductal dilatation or surrounding inflammatory changes.   Spleen: Normal in size without focal abnormality.   Adrenals/Urinary Tract: Adrenal glands are unremarkable. Kidneys are normal, without renal calculi, focal lesion, or hydronephrosis. Bladder is unremarkable.   Stomach/Bowel: There is no evidence of bowel obstruction or inflammation. Diverticulosis of descending and sigmoid colon is noted without inflammation. The appendix is not visualized.   Vascular/Lymphatic: Aortic atherosclerosis. No enlarged abdominal or pelvic lymph nodes.   Reproductive: Prostate is unremarkable.   Other: Small fat containing periumbilical hernia is noted. No ascites is noted.    Musculoskeletal: Multiple small lucencies are noted throughout the pelvis and visualized proximal femurs concerning for possible lytic lesions related to multiple myeloma or metastatic disease.   IMPRESSION: 1. Moderate size sliding-type hiatal hernia. 2. Diverticulosis of descending and sigmoid colon is noted without inflammation. 3. Small fat containing periumbilical hernia. 4. Multiple small lucencies are noted throughout the pelvis and visualized proximal femurs concerning for possible lytic lesions related to multiple myeloma or metastatic disease. MRI may be performed for further evaluation.   MRI Sep 10, 2020: FINDINGS: Brain: No evidence of acute infarct. Remote infarct in the lateral right temporal lobe. Additional remote lacunar infarcts involving the bilateral thalami and bilateral cerebellar hemispheres. Additional small T2 hyperintensities in the basal ganglia most likely represent dilated perivascular spaces. There is advanced/confluent T2/FLAIR hyperintensities within the white matter and pons, most likely related to chronic microvascular ischemic disease. Punctate area of susceptibility artifact in the right cerebellar hemisphere, likely related to prior microhemorrhage. Mild generalized cerebral volume loss. No hydrocephalus. Cavum septum pellucidum et vergae, anatomic variant. No acute hemorrhage. No mass lesion  or abnormal mass effect. No extra-axial fluid collection.   Vascular: Major arterial flow voids are maintained at the skull base.   Skull and upper cervical spine: Normal marrow signal. Partially imaged upper cervical spine degenerative change.   Sinuses/Orbits: Right maxillary sinus retention cyst versus polyp. Scattered ethmoid air cell mucosal thickening without air-fluid levels. Unremarkable orbits.   Other: No sizable mastoid effusions.   IMPRESSION: 1. No evidence of acute intracranial abnormality.  No acute infarct. 2. Remote right temporal  lobe and bilateral basal ganglia and cerebellar infarcts. 3. Advanced chronic microvascular ischemic disease.   TTE 08/07/20: IMPRESSIONS   1. Left ventricular ejection fraction, by estimation, is 60 to 65%. The  left ventricle has normal function. The left ventricle has no regional  wall motion abnormalities. There is moderate left ventricular hypertrophy  of the basal-septal segment. Left  ventricular diastolic parameters are consistent with Grade I diastolic  dysfunction (impaired relaxation). Elevated left ventricular end-diastolic  pressure.   2. Right ventricular systolic function is mildly reduced. The right  ventricular size is normal.   3. The mitral valve is degenerative. Mild mitral valve regurgitation. No  evidence of mitral stenosis. Moderate mitral annular calcification.   4. The aortic valve is normal in structure. There is mild calcification  of the aortic valve. There is mild thickening of the aortic valve. Aortic  valve regurgitation is not visualized. Mild to moderate aortic valve  sclerosis/calcification is present,  without any evidence of aortic stenosis.   5. Aortic dilatation noted. There is mild dilatation of the aortic root,  measuring 40 mm. There is mild dilatation of the ascending aorta,  measuring 43 mm.   EKG:   11/27/2020: EKG is not ordered today.  Recent Labs: 07/17/2020: TSH 2.91 09/08/2020: Magnesium 1.7 10/11/2020: ALT 21; BUN 21; Creatinine, Ser 0.97; Hemoglobin 10.9; Platelets 158; Potassium 4.0; Sodium 139  Recent Lipid Panel    Component Value Date/Time   CHOL 90 08/31/2020 0523   TRIG 108 08/31/2020 0523   HDL 31 (L) 08/31/2020 0523   CHOLHDL 2.9 08/31/2020 0523   VLDL 22 08/31/2020 0523   LDLCALC 37 08/31/2020 0523     Risk Assessment/Calculations:       Physical Exam:    VS:  There were no vitals taken for this visit.    Wt Readings from Last 3 Encounters:  12/31/20 162 lb (73.5 kg)  11/27/20 169 lb 9.6 oz (76.9 kg)   10/16/20 172 lb (78 kg)     GEN: Well nourished, well developed in no acute distress HEENT: Normal NECK: No JVD; No carotid bruits LYMPHATICS: No lymphadenopathy CARDIAC: RRR, no murmurs, rubs, gallops RESPIRATORY:  Clear to auscultation without rales, wheezing or rhonchi  ABDOMEN: Soft, non-tender, non-distended MUSCULOSKELETAL:  No edema; No deformity  SKIN: Warm and dry NEUROLOGIC:  Alert and oriented x 3 PSYCHIATRIC:  Normal affect   ASSESSMENT:    No diagnosis found.  PLAN:    In order of problems listed above:  #Multivessel CAD s/p CABG in 2001: Patient with history of multiple MI's in the past with initial occurring at age 25. Had several subsequent PCIs (thinks he has 10 stents) and then suffered MI in 2001 prompting bypass surgery. Has been doing well post-bypass with no further chest pain or SOB. He has not required PCI since his bypass surgery. Has been followed by Cardiology in [place], [place] prior to transferring care here.  -TTE with LVEF 60-65%, no WMA -ASA stopped given need for [facility] and  more frequent falls -Weaned off all blood pressure medications given orthostatic hypotension with falls and recent stroke ***   #HTN: #Orthostatic Hypotension: Now with episodes of hypotension. Weaned off of medications due to frequent lows with syncope. Rather than have two medications aimed at doing opposite things (florinef and coreg), both were stopped. Tolerating higher Bps to avoid lows.  -Stopped coreg and florinef and monitor -Will tolerate higher pressures to avoid lows   #Suspected SSS s/p PPM placement: Medtronic PPM functioning well. -Follow-up with EP as scheduled   #Recent Stroke: Thought to be possibly secondary to low blood pressures. MRI with chronic ischemic changes.  -Continue crestor 2m daily -Stopped ASA given need for AC and high bleed risk   #HLD: -Continue crestor 256mdaily   #DMII on insulin: -Management per PCP   #History of PE: Changed  from warfarin to apixaban. -Continue apixaban 16m50mID   Medication Adjustments/Labs and Tests Ordered: Current medicines are reviewed at length with the patient today.  Concerns regarding medicines are outlined above.  No orders of the defined types were placed in this encounter.  No orders of the defined types were placed in this encounter.   There are no Patient Instructions on file for this visit.    I,Mathew Stumpf,acting as a scrEducation administratorr HeaFreada BergeronD.,have documented all relevant documentation on the behalf of HeaFreada BergeronD,as directed by  HeaFreada BergeronD while in the presence of HeaFreada BergeronD.  I, HeaFreada BergeronD, have reviewed all documentation for this visit. The documentation on 01/05/21 for the exam, diagnosis, procedures, and orders are all accurate and complete.  Signed, HeaFreada BergeronD  01/05/2021 7:18 AM    [place]

## 2021-01-07 NOTE — Progress Notes (Signed)
Cardiology Office Note:    Date:  01/08/2021   ID:  Thomas [name], DOB June 15, 1929, MRN [id number]  PCP:  Thomas Barrack, MD   [facility] Providers Cardiologist:  None {    Referring MD: Thomas Barrack, MD    History of Present Illness:    Thomas [name] is a 87 y.o. male with a hx of  HLD, DMII, prior CVA, HTN, CAD s/p CABG in 2001, SSS s/p PPM, pulmonary embolism on warfarin, orthostatic hypotension and hypothyroidism who returns to clinic for follow-up.  Patient has a history of MI at age 12 that was managed medically at that time. He had subsequent PCIs and MI in 2001 leading to bypass surgery. He states he has had no chest pain or shortness of breath since his surgery. No stents placed after bypass surgery. Blood pressures fluctuate significantly at home from 90/50s to 200s/100s. Was seen in HTN clinic and where his clonidine was weaned, pindolol was changed to coreg; ramipril and chlorthalidone were continued at current doses. Also saw Dr. Lovena [name] with EP to manage his medtronic PPM which was functioning well.    TTE showed LVEF 60-65%, moderate basal-septal hypertrophy, G1DD, dilated aorta/aortic root, moderate aortic sclerosis without stenosis, moderate MAC, mild MR.   Had recent fall where he was seen in ED in 07/2020. CT head negative for acute pathology. MRI head with remote strokes (known) but no acute pathology.   Was seen on 10/11/20 for syncope thought to be due to coreg. He was recommended for admission but he did not want to stay. His coreg was decreased.   Last seen in clinic on 11/27/20. Had been off of many of his blood pressure medications due to orthostasis. He was also on floinef at that time. At that time, his blood pressure was relatively stable. Given his risk of falls, his warfarin was changed to apixaban and his aspirin was stopped.     Today, he is accompanied by his son. Patient's sons says patient is doing okay and his blood pressure has been in the  140s/70s.He has resumed coreg 12.33m BID and is doing well on this with no episodes of lightheadedness, dizziness or syncope. He is currently working with PT to help his strength and mobility. Has been having low appetite and was started on remeron.   Patient denies chest pain, shortness of breath, abdominal pain, cramps, diarrhea, palpitations, nausea, headaches, lightheadedness, [name] Edema, syncope,orthopnea or PND.  Past Medical History:  Diagnosis Date   Arthritis    [place]    Coronary artery disease    Diabetes ([place]    Hyperlipidemia    Hypertension    Hypothyroidism 07/17/2020   Kidney disease    Loss of hearing    Macular degeneration    Myocardial infarction ([facility]    Osteoarthritis    Stroke ([facility]     Past Surgical History:  Procedure Laterality Date   CARPAL TUNNEL RELEASE  2011   CORONARY ANGIOPLASTY WITH STENT PLACEMENT     CORONARY ARTERY BYPASS GRAFT  2001   x6   REPLACEMENT TOTAL KNEE     SPINE SURGERY  2008   cervical and lumbar   THYROIDECTOMY      Current Medications: Current Meds  Medication Sig   acetaminophen (TYLENOL) 325 MG tablet Take 650 mg by mouth every 6 (six) hours as needed for moderate pain or headache.   apixaban (ELIQUIS) 5 MG TABS tablet Take 1 tablet (5 mg total) by mouth 2 (  two) times daily.   b complex vitamins capsule Take 1 capsule by mouth daily.   cholecalciferol (VITAMIN D3) 25 MCG (1000 UNIT) tablet Take 1,000 Units by mouth daily.   co-enzyme Q-10 50 MG capsule Take 50 mg by mouth daily.   ferrous sulfate 325 (65 FE) MG EC tablet Take 1 tablet (325 mg total) by mouth daily with breakfast.   glucosamine-chondroitin 500-400 MG tablet Take 1 tablet by mouth 3 (three) times daily.   insulin glargine (LANTUS) 100 unit/mL SOPN Inject 15 Units into the skin daily.   Insulin Pen Needle 31G X 5 MM MISC 1 each by Does not apply route daily. Please use a new pen needle daily with Lantus injection   levothyroxine (SYNTHROID)  100 MCG tablet TAKE 1 TABLET(100 MCG) BY MOUTH DAILY   mirtazapine (REMERON) 7.5 MG tablet Take 1 tablet (7.5 mg total) by mouth at bedtime.   Multiple Vitamin (MULTIVITAMIN) tablet Take 1 tablet by mouth daily.   Red Yeast Rice Extract (RED YEAST RICE PO) Take 1 capsule by mouth daily.   rosuvastatin (CRESTOR) 20 MG tablet Take 1 tablet (20 mg total) by mouth at bedtime.   tamsulosin (FLOMAX) 0.4 MG CAPS capsule Take 0.4 mg by mouth daily after supper.   [DISCONTINUED] carvedilol (COREG) 12.5 MG tablet Take 12.5 mg by mouth 2 (two) times daily with a meal.     Allergies:   Amitriptyline, Effexor xr [venlafaxine], Lyrica [pregabalin], Morphine, Morphine and related, Pravachol [pravastatin], and Zolpidem   Social History   Socioeconomic History   Marital status: Married    Spouse name: Not on file   Number of children: Not on file   Years of education: Not on file   Highest education level: Not on file  Occupational History   Not on file  Tobacco Use   Smoking status: Never   Smokeless tobacco: Never  Substance and Sexual Activity   Alcohol use: Never   Drug use: Never   Sexual activity: Not on file  Other Topics Concern   Not on file  Social History Narrative   Not on file   Social Determinants of Health   Financial Resource Strain: Not on file  Food Insecurity: Not on file  Transportation Needs: Not on file  Physical Activity: Not on file  Stress: Not on file  Social Connections: Not on file     Family History: The patient's family history includes Cancer in his brother and daughter; Heart disease in his brother, brother, and sister; Hypertension in his son; Stroke in his father and mother.  ROS:   Review of Systems  Constitutional:  Negative for chills, fever and malaise/fatigue.  HENT:  Negative for ear pain and hearing loss.   Eyes:  Negative for photophobia and pain.  Respiratory:  Negative for cough and hemoptysis.   Cardiovascular:  Negative for chest pain,  palpitations, orthopnea, claudication, leg swelling and PND.  Gastrointestinal:  Negative for blood in stool and diarrhea.  Genitourinary:  Negative for frequency and hematuria.  Musculoskeletal:  Negative for myalgias and neck pain.  Neurological:  Negative for dizziness, tingling and weakness.  Endo/Heme/Allergies:  Does not bruise/bleed easily.  Psychiatric/Behavioral:  Negative for hallucinations. The patient does not have insomnia.     EKGs/Labs/Other Studies Reviewed:    The following studies were reviewed today:  CT 09/13/20: IMPRESSION: Atrophy with periventricular small vessel disease. Prior small infarcts in each thalamus as well as in the anterior limb of the left external capsule. No  acute infarct is evident. No mass or hemorrhage.   There are foci of arterial vascular calcification. There are foci of paranasal sinus disease.   ECHO 08/07/2020:   IMPRESSIONS   1. Left ventricular ejection fraction, by estimation, is 60 to 65%. The  left ventricle has normal function. The left ventricle has no regional  wall motion abnormalities. There is moderate left ventricular hypertrophy  of the basal-septal segment. Left  ventricular diastolic parameters are consistent with Grade I diastolic  dysfunction (impaired relaxation). Elevated left ventricular end-diastolic  pressure.   2. Right ventricular systolic function is mildly reduced. The right  ventricular size is normal.   3. The mitral valve is degenerative. Mild mitral valve regurgitation. No  evidence of mitral stenosis. Moderate mitral annular calcification.   4. The aortic valve is normal in structure. There is mild calcification  of the aortic valve. There is mild thickening of the aortic valve. Aortic  valve regurgitation is not visualized. Mild to moderate aortic valve  sclerosis/calcification is present,  without any evidence of aortic stenosis.   5. Aortic dilatation noted. There is mild dilatation of the aortic  root,  measuring 40 mm. There is mild dilatation of the ascending aorta,  measuring 43 mm.  CT abdomen/pelvis: FINDINGS: Lower chest: Visualized lung bases are unremarkable. Moderate size sliding-type hiatal hernia is noted.   Hepatobiliary: No focal liver abnormality is seen. No gallstones, gallbladder wall thickening, or biliary dilatation.   Pancreas: Unremarkable. No pancreatic ductal dilatation or surrounding inflammatory changes.   Spleen: Normal in size without focal abnormality.   Adrenals/Urinary Tract: Adrenal glands are unremarkable. Kidneys are normal, without renal calculi, focal lesion, or hydronephrosis. Bladder is unremarkable.   Stomach/Bowel: There is no evidence of bowel obstruction or inflammation. Diverticulosis of descending and sigmoid colon is noted without inflammation. The appendix is not visualized.   Vascular/Lymphatic: Aortic atherosclerosis. No enlarged abdominal or pelvic lymph nodes.   Reproductive: Prostate is unremarkable.   Other: Small fat containing periumbilical hernia is noted. No ascites is noted.   Musculoskeletal: Multiple small lucencies are noted throughout the pelvis and visualized proximal femurs concerning for possible lytic lesions related to multiple myeloma or metastatic disease.   IMPRESSION: 1. Moderate size sliding-type hiatal hernia. 2. Diverticulosis of descending and sigmoid colon is noted without inflammation. 3. Small fat containing periumbilical hernia. 4. Multiple small lucencies are noted throughout the pelvis and visualized proximal femurs concerning for possible lytic lesions related to multiple myeloma or metastatic disease. MRI may be performed for further evaluation.   MRI 08/19/20: FINDINGS: Brain: No evidence of acute infarct. Remote infarct in the lateral right temporal lobe. Additional remote lacunar infarcts involving the bilateral thalami and bilateral cerebellar hemispheres. Additional small T2  hyperintensities in the basal ganglia most likely represent dilated perivascular spaces. There is advanced/confluent T2/FLAIR hyperintensities within the white matter and pons, most likely related to chronic microvascular ischemic disease. Punctate area of susceptibility artifact in the right cerebellar hemisphere, likely related to prior microhemorrhage. Mild generalized cerebral volume loss. No hydrocephalus. Cavum septum pellucidum et vergae, anatomic variant. No acute hemorrhage. No mass lesion or abnormal mass effect. No extra-axial fluid collection.   Vascular: Major arterial flow voids are maintained at the skull base.   Skull and upper cervical spine: Normal marrow signal. Partially imaged upper cervical spine degenerative change.   Sinuses/Orbits: Right maxillary sinus retention cyst versus polyp. Scattered ethmoid air cell mucosal thickening without air-fluid levels. Unremarkable orbits.   Other: No sizable mastoid effusions.  IMPRESSION: 1. No evidence of acute intracranial abnormality.  No acute infarct. 2. Remote right temporal lobe and bilateral basal ganglia and cerebellar infarcts. 3. Advanced chronic microvascular ischemic disease.   TTE 08/07/20: IMPRESSIONS   1. Left ventricular ejection fraction, by estimation, is 60 to 65%. The  left ventricle has normal function. The left ventricle has no regional  wall motion abnormalities. There is moderate left ventricular hypertrophy  of the basal-septal segment. Left  ventricular diastolic parameters are consistent with Grade I diastolic  dysfunction (impaired relaxation). Elevated left ventricular end-diastolic  pressure.   2. Right ventricular systolic function is mildly reduced. The right  ventricular size is normal.   3. The mitral valve is degenerative. Mild mitral valve regurgitation. No  evidence of mitral stenosis. Moderate mitral annular calcification.   4. The aortic valve is normal in structure. There is  mild calcification  of the aortic valve. There is mild thickening of the aortic valve. Aortic  valve regurgitation is not visualized. Mild to moderate aortic valve  sclerosis/calcification is present,  without any evidence of aortic stenosis.   5. Aortic dilatation noted. There is mild dilatation of the aortic root,  measuring 40 mm. There is mild dilatation of the ascending aorta,  measuring 43 mm.   EKG:   01/08/2021: ekg not ordered today 11/27/2020: EKG is not ordered today.  Recent Labs: 07/17/2020: TSH 2.91 09/08/2020: Magnesium 1.7 10/11/2020: ALT 21; BUN 21; Creatinine, Ser 0.97; Hemoglobin 10.9; Platelets 158; Potassium 4.0; Sodium 139  Recent Lipid Panel    Component Value Date/Time   CHOL 90 08/31/2020 0523   TRIG 108 08/31/2020 0523   HDL 31 (L) 08/31/2020 0523   CHOLHDL 2.9 08/31/2020 0523   VLDL 22 08/31/2020 0523   LDLCALC 37 08/31/2020 0523     Risk Assessment/Calculations:       Physical Exam:    VS:  BP 140/72   Pulse 75   Ht _0  (1.727 m)   Wt 161 lb 6.4 oz (73.2 kg)   SpO2 98%   BMI 24.54 kg/m     Wt Readings from Last 3 Encounters:  01/08/21 161 lb 6.4 oz (73.2 kg)  12/31/20 162 lb (73.5 kg)  11/27/20 169 lb 9.6 oz (76.9 kg)     GEN: Elderly male, hard of hearing HEENT: Normal NECK: No JVD; No carotid bruits CARDIAC: RRR, no murmurs, rubs, gallops RESPIRATORY:  Clear to auscultation without rales, wheezing or rhonchi  ABDOMEN: Soft, non-tender, non-distended MUSCULOSKELETAL:  No edema; No deformity  SKIN: Warm and dry NEUROLOGIC:  Alert and oriented x 3 PSYCHIATRIC:  Normal affect   ASSESSMENT:    1. Coronary artery disease involving native heart with angina pectoris, unspecified vessel or lesion type ([place])   2. Essential hypertension   3. Sick sinus syndrome ([place])   4. Hx of pulmonary embolus   5. Type 2 diabetes mellitus with hyperglycemia, unspecified whether long term insulin use ([name])   6. Pure hypercholesterolemia   7.  Cerebrovascular accident (CVA), unspecified mechanism ([place])   8. Orthostatic hypotension   9. Primary hypertension     PLAN:    In order of problems listed above:  #Multivessel CAD s/p CABG in 2001: Patient with history of multiple MI's in the past with initial occurring at age 48. Had several subsequent PCIs (thinks he has 10 stents) and then suffered MI in 2001 prompting bypass surgery. Has been doing well post-bypass with no further chest pain or SOB. He has not required  PCI since his bypass surgery. Has been followed by Cardiology in [place], [place] prior to transferring care here.  -TTE with LVEF 60-65%, no WMA -ASA stopped given need for [facility] and more frequent falls -Resumed coreg 12.43m BID and tolerating well with no orthostatic symptoms   #HTN: #Orthostatic Hypotension: Weaned off of medications due to frequent lows with syncope. Rather than have two medications aimed at doing opposite things (florinef and coreg), both were stopped at last visit. Bps running high at home lately and has resumed coreg 12.522mBI which he is tolerating well with no orthostatic symptoms.  -Resumed coreg 12.71m57mID and tolerating well with no orthostatic symptoms -Will tolerate higher pressures to avoid lows   #Suspected SSS s/p PPM placement: Medtronic PPM functioning well. -Follow-up with EP as scheduled   #Recent Stroke: Thought to be possibly secondary to low blood pressures. MRI with chronic ischemic changes.  -Continue crestor 71m58mily -Stopped ASA given need for AC and high bleed risk   #HLD: -Continue crestor 71mg80mly   #DMII on insulin: -Management per PCP   #History of PE: Changed from warfarin to apixaban. -Continue apixaban 71mg B23m  FOLLOW UP IN 3-5 MONTHS   [facility]  [facility]    Medication Adjustments/Labs and Tests Ordered: Current medicines are reviewed at length with the patient today.  Concerns regarding medicines are outlined  above.  No orders of the defined types were placed in this encounter.  Meds ordered this encounter  Medications   carvedilol (COREG) 12.5 MG tablet    Sig: Take 1 tablet (12.5 mg total) by mouth 2 (two) times daily with a meal.    Dispense:  180 tablet    Refill:  1    Patient Instructions  Medication Instructions:   Your physician recommends that you continue on your current medications as directed. Please refer to the Current Medication list given to you today.  *If you need a refill on your cardiac medications before your next appointment, please call your pharmacy*   Follow-Up:  3-4 MONTHS WITH AN EXTENDER IN THE OFFICE     I, HeatheFreada Bergeronhave reviewed all documentation for this visit. The documentation on 01/08/21 for the exam, diagnosis, procedures, and orders are all accurate and complete.  I, HeatheFreada Bergeronhave reviewed all documentation for this visit. The documentation on 01/08/21 for the exam, diagnosis, procedures, and orders are all accurate and complete.   Signed, HeatheFreada Bergeron7/14/2022 3:18 PM    [place] [place]

## 2021-01-08 ENCOUNTER — Ambulatory Visit (INDEPENDENT_AMBULATORY_CARE_PROVIDER_SITE_OTHER): Payer: Medicare Other | Admitting: Cardiology

## 2021-01-08 ENCOUNTER — Other Ambulatory Visit: Payer: Self-pay

## 2021-01-08 ENCOUNTER — Encounter: Payer: Self-pay | Admitting: Cardiology

## 2021-01-08 VITALS — BP 140/72 | HR 75 | Ht 68.0 in | Wt 161.4 lb

## 2021-01-08 DIAGNOSIS — I495 Sick sinus syndrome: Secondary | ICD-10-CM | POA: Diagnosis not present

## 2021-01-08 DIAGNOSIS — Z86711 Personal history of pulmonary embolism: Secondary | ICD-10-CM | POA: Diagnosis not present

## 2021-01-08 DIAGNOSIS — E1165 Type 2 diabetes mellitus with hyperglycemia: Secondary | ICD-10-CM

## 2021-01-08 DIAGNOSIS — I10 Essential (primary) hypertension: Secondary | ICD-10-CM | POA: Diagnosis not present

## 2021-01-08 DIAGNOSIS — I25119 Atherosclerotic heart disease of native coronary artery with unspecified angina pectoris: Secondary | ICD-10-CM

## 2021-01-08 DIAGNOSIS — E7800 Pure hypercholesterolemia, unspecified: Secondary | ICD-10-CM

## 2021-01-08 DIAGNOSIS — I951 Orthostatic hypotension: Secondary | ICD-10-CM

## 2021-01-08 DIAGNOSIS — I639 Cerebral infarction, unspecified: Secondary | ICD-10-CM

## 2021-01-08 MED ORDER — CARVEDILOL 12.5 MG PO TABS: 12.5000 mg | ORAL_TABLET | Freq: Two times a day (BID) | ORAL | 1 refills | Status: DC

## 2021-01-08 NOTE — Patient Instructions (Signed)
Medication Instructions:   Your physician recommends that you continue on your current medications as directed. Please refer to the Current Medication list given to you today.  *If you need a refill on your cardiac medications before your next appointment, please call your pharmacy*   Follow-Up:  3-4 MONTHS WITH AN EXTENDER IN THE OFFICE

## 2021-01-12 ENCOUNTER — Telehealth: Payer: Self-pay | Admitting: *Deleted

## 2021-01-12 DIAGNOSIS — H353231 Exudative age-related macular degeneration, bilateral, with active choroidal neovascularization: Secondary | ICD-10-CM | POA: Diagnosis not present

## 2021-01-12 NOTE — Telephone Encounter (Signed)
Lorenda Hatchet PT Requesting extension for patient PT  2x per week for one week and  1x per week for 1 week   VO given   May need additional orders.

## 2021-01-12 NOTE — Telephone Encounter (Signed)
Ok with me. Please place any necessary orders. 

## 2021-01-20 ENCOUNTER — Telehealth: Payer: Self-pay

## 2021-01-20 NOTE — Telephone Encounter (Signed)
Home Health Verbal Orders  Agency:  [facility]  Requesting: PT     Frequency:  twice a week 4 once a week for 2 weeks  HH needs F2F w/in last 30 days

## 2021-01-21 NOTE — Telephone Encounter (Signed)
Called and spoke with Onalee Hua and VO given.

## 2021-01-23 ENCOUNTER — Telehealth: Payer: Self-pay | Admitting: Cardiology

## 2021-01-23 NOTE — Telephone Encounter (Signed)
Pt c/o BP issue: STAT if pt c/o blurred vision, one-sided weakness or slurred speech  1. What are your last 5 BP readings? 132/60; 140/84; 218/87; 87/65  2. Are you having any other symptoms (ex. Dizziness, headache, blurred vision, passed out)? No   3. What is your BP issue? Fluctuating of BP

## 2021-01-23 NOTE — Telephone Encounter (Signed)
Spoke with the patient's wife who states that the patient's blood pressure has been fluctuating. She states that last night it was running high. This morning he took a dose of carvedilol and BP bottomed out at 87/65. Patient denies any lightheadedness or dizziness. Patient's wife states that he is doing fine. They rechecked his blood pressure later this morning, she does not have the reading but states that it came back up to normal. Advised the patient's wife to continue to monitor and to take his blood pressure prior to taking his medication. If blood pressure is low <100/60 its okay to hold a dose of his carvedilol. Patient's wife verbalized understanding.

## 2021-01-27 DIAGNOSIS — I69392 Facial weakness following cerebral infarction: Secondary | ICD-10-CM | POA: Diagnosis not present

## 2021-01-27 DIAGNOSIS — E1122 Type 2 diabetes mellitus with diabetic chronic kidney disease: Secondary | ICD-10-CM | POA: Diagnosis not present

## 2021-01-27 DIAGNOSIS — N400 Enlarged prostate without lower urinary tract symptoms: Secondary | ICD-10-CM | POA: Diagnosis not present

## 2021-01-27 DIAGNOSIS — E1165 Type 2 diabetes mellitus with hyperglycemia: Secondary | ICD-10-CM | POA: Diagnosis not present

## 2021-01-27 DIAGNOSIS — E1169 Type 2 diabetes mellitus with other specified complication: Secondary | ICD-10-CM | POA: Diagnosis not present

## 2021-01-27 DIAGNOSIS — I951 Orthostatic hypotension: Secondary | ICD-10-CM | POA: Diagnosis not present

## 2021-01-27 DIAGNOSIS — M150 Primary generalized (osteo)arthritis: Secondary | ICD-10-CM | POA: Diagnosis not present

## 2021-01-27 DIAGNOSIS — E039 Hypothyroidism, unspecified: Secondary | ICD-10-CM | POA: Diagnosis not present

## 2021-01-27 DIAGNOSIS — E785 Hyperlipidemia, unspecified: Secondary | ICD-10-CM | POA: Diagnosis not present

## 2021-01-27 DIAGNOSIS — N189 Chronic kidney disease, unspecified: Secondary | ICD-10-CM | POA: Diagnosis not present

## 2021-01-27 DIAGNOSIS — I69320 Aphasia following cerebral infarction: Secondary | ICD-10-CM | POA: Diagnosis not present

## 2021-01-27 DIAGNOSIS — I2510 Atherosclerotic heart disease of native coronary artery without angina pectoris: Secondary | ICD-10-CM | POA: Diagnosis not present

## 2021-01-27 DIAGNOSIS — H9190 Unspecified hearing loss, unspecified ear: Secondary | ICD-10-CM | POA: Diagnosis not present

## 2021-01-27 DIAGNOSIS — I1310 Hypertensive heart and chronic kidney disease without heart failure, with stage 1 through stage 4 chronic kidney disease, or unspecified chronic kidney disease: Secondary | ICD-10-CM | POA: Diagnosis not present

## 2021-01-27 DIAGNOSIS — I69351 Hemiplegia and hemiparesis following cerebral infarction affecting right dominant side: Secondary | ICD-10-CM | POA: Diagnosis not present

## 2021-01-27 DIAGNOSIS — D631 Anemia in chronic kidney disease: Secondary | ICD-10-CM | POA: Diagnosis not present

## 2021-01-29 ENCOUNTER — Ambulatory Visit (INDEPENDENT_AMBULATORY_CARE_PROVIDER_SITE_OTHER): Payer: Medicare Other

## 2021-01-29 DIAGNOSIS — I495 Sick sinus syndrome: Secondary | ICD-10-CM

## 2021-01-29 LAB — CUP PACEART REMOTE DEVICE CHECK
Battery Remaining Longevity: 142 mo
Battery Voltage: 3.02 V
Brady Statistic AP VP Percent: 0.06 %
Brady Statistic AP VS Percent: 94.94 %
Brady Statistic AS VP Percent: 0 %
Brady Statistic AS VS Percent: 5 %
Brady Statistic RA Percent Paced: 95.57 %
Brady Statistic RV Percent Paced: 0.06 %
Date Time Interrogation Session: 20220804065053
Implantable Lead Implant Date: 20081106
Implantable Lead Implant Date: 20210210
Implantable Lead Location: 753859
Implantable Lead Location: 753860
Implantable Lead Model: 4076
Implantable Lead Model: 4076
Implantable Pulse Generator Implant Date: 20210210
Lead Channel Impedance Value: 285 Ohm
Lead Channel Impedance Value: 342 Ohm
Lead Channel Impedance Value: 380 Ohm
Lead Channel Impedance Value: 551 Ohm
Lead Channel Pacing Threshold Amplitude: 0.5 V
Lead Channel Pacing Threshold Amplitude: 0.875 V
Lead Channel Pacing Threshold Pulse Width: 0.4 ms
Lead Channel Pacing Threshold Pulse Width: 0.4 ms
Lead Channel Sensing Intrinsic Amplitude: 1 mV
Lead Channel Sensing Intrinsic Amplitude: 1 mV
Lead Channel Sensing Intrinsic Amplitude: 8.625 mV
Lead Channel Sensing Intrinsic Amplitude: 8.625 mV
Lead Channel Setting Pacing Amplitude: 1.5 V
Lead Channel Setting Pacing Amplitude: 2 V
Lead Channel Setting Pacing Pulse Width: 0.4 ms
Lead Channel Setting Sensing Sensitivity: 0.9 mV

## 2021-01-30 ENCOUNTER — Ambulatory Visit: Payer: Medicare Other | Admitting: Cardiology

## 2021-02-02 ENCOUNTER — Telehealth: Payer: Self-pay | Admitting: Lab

## 2021-02-02 NOTE — Progress Notes (Signed)
  Chronic Care Management   Note  02/02/2021 Name: Thomas [name] MRN: [id number] DOB: [birth year]  Thomas [name] is a 87 y.o. year old male who is a primary care patient of Ardith Dark, MD. I reached out to [name] by phone today in response to a referral sent by Thomas [name] PCP, Ardith Dark, MD.   Thomas [name] was given information about Chronic Care Management services today including:  CCM service includes personalized support from designated clinical staff supervised by his physician, including individualized plan of care and coordination with other care providers 24/7 contact phone numbers for assistance for urgent and routine care needs. Service will only be billed when office clinical staff spend 20 minutes or more in a month to coordinate care. Only one practitioner may furnish and bill the service in a calendar month. The patient may stop CCM services at any time (effective at the end of the month) by phone call to the office staff.   Thomas [name] verbally agreed to assistance and services provided by embedded care coordination/care management team today.  Follow up plan:  Carilyn Goodpasture  [employer] Scheduler

## 2021-02-03 DIAGNOSIS — H3530 Unspecified macular degeneration: Secondary | ICD-10-CM

## 2021-02-03 DIAGNOSIS — K449 Diaphragmatic hernia without obstruction or gangrene: Secondary | ICD-10-CM

## 2021-02-03 DIAGNOSIS — E1169 Type 2 diabetes mellitus with other specified complication: Secondary | ICD-10-CM

## 2021-02-03 DIAGNOSIS — I1310 Hypertensive heart and chronic kidney disease without heart failure, with stage 1 through stage 4 chronic kidney disease, or unspecified chronic kidney disease: Secondary | ICD-10-CM

## 2021-02-03 DIAGNOSIS — Z955 Presence of coronary angioplasty implant and graft: Secondary | ICD-10-CM

## 2021-02-03 DIAGNOSIS — I69351 Hemiplegia and hemiparesis following cerebral infarction affecting right dominant side: Secondary | ICD-10-CM | POA: Diagnosis not present

## 2021-02-03 DIAGNOSIS — I672 Cerebral atherosclerosis: Secondary | ICD-10-CM

## 2021-02-03 DIAGNOSIS — N189 Chronic kidney disease, unspecified: Secondary | ICD-10-CM

## 2021-02-03 DIAGNOSIS — I951 Orthostatic hypotension: Secondary | ICD-10-CM

## 2021-02-03 DIAGNOSIS — E1165 Type 2 diabetes mellitus with hyperglycemia: Secondary | ICD-10-CM

## 2021-02-03 DIAGNOSIS — I69392 Facial weakness following cerebral infarction: Secondary | ICD-10-CM | POA: Diagnosis not present

## 2021-02-03 DIAGNOSIS — G319 Degenerative disease of nervous system, unspecified: Secondary | ICD-10-CM

## 2021-02-03 DIAGNOSIS — Z87440 Personal history of urinary (tract) infections: Secondary | ICD-10-CM

## 2021-02-03 DIAGNOSIS — E039 Hypothyroidism, unspecified: Secondary | ICD-10-CM

## 2021-02-03 DIAGNOSIS — M150 Primary generalized (osteo)arthritis: Secondary | ICD-10-CM | POA: Diagnosis not present

## 2021-02-03 DIAGNOSIS — K429 Umbilical hernia without obstruction or gangrene: Secondary | ICD-10-CM

## 2021-02-03 DIAGNOSIS — Z950 Presence of cardiac pacemaker: Secondary | ICD-10-CM

## 2021-02-03 DIAGNOSIS — H9190 Unspecified hearing loss, unspecified ear: Secondary | ICD-10-CM

## 2021-02-03 DIAGNOSIS — I69320 Aphasia following cerebral infarction: Secondary | ICD-10-CM | POA: Diagnosis not present

## 2021-02-03 DIAGNOSIS — Z794 Long term (current) use of insulin: Secondary | ICD-10-CM

## 2021-02-03 DIAGNOSIS — I2510 Atherosclerotic heart disease of native coronary artery without angina pectoris: Secondary | ICD-10-CM

## 2021-02-03 DIAGNOSIS — K5730 Diverticulosis of large intestine without perforation or abscess without bleeding: Secondary | ICD-10-CM

## 2021-02-03 DIAGNOSIS — Z951 Presence of aortocoronary bypass graft: Secondary | ICD-10-CM

## 2021-02-03 DIAGNOSIS — N400 Enlarged prostate without lower urinary tract symptoms: Secondary | ICD-10-CM

## 2021-02-03 DIAGNOSIS — I700 Atherosclerosis of aorta: Secondary | ICD-10-CM

## 2021-02-03 DIAGNOSIS — E1122 Type 2 diabetes mellitus with diabetic chronic kidney disease: Secondary | ICD-10-CM

## 2021-02-03 DIAGNOSIS — E785 Hyperlipidemia, unspecified: Secondary | ICD-10-CM

## 2021-02-03 DIAGNOSIS — Z86718 Personal history of other venous thrombosis and embolism: Secondary | ICD-10-CM

## 2021-02-03 DIAGNOSIS — D631 Anemia in chronic kidney disease: Secondary | ICD-10-CM

## 2021-02-03 DIAGNOSIS — Z9181 History of falling: Secondary | ICD-10-CM

## 2021-02-20 ENCOUNTER — Other Ambulatory Visit: Payer: Self-pay | Admitting: Family Medicine

## 2021-02-20 NOTE — Progress Notes (Signed)
Remote pacemaker transmission.   

## 2021-02-23 DIAGNOSIS — E039 Hypothyroidism, unspecified: Secondary | ICD-10-CM | POA: Diagnosis not present

## 2021-02-23 DIAGNOSIS — M150 Primary generalized (osteo)arthritis: Secondary | ICD-10-CM | POA: Diagnosis not present

## 2021-02-23 DIAGNOSIS — H9190 Unspecified hearing loss, unspecified ear: Secondary | ICD-10-CM | POA: Diagnosis not present

## 2021-02-23 DIAGNOSIS — E785 Hyperlipidemia, unspecified: Secondary | ICD-10-CM | POA: Diagnosis not present

## 2021-02-23 DIAGNOSIS — I69392 Facial weakness following cerebral infarction: Secondary | ICD-10-CM | POA: Diagnosis not present

## 2021-02-23 DIAGNOSIS — E1122 Type 2 diabetes mellitus with diabetic chronic kidney disease: Secondary | ICD-10-CM | POA: Diagnosis not present

## 2021-02-23 DIAGNOSIS — I1310 Hypertensive heart and chronic kidney disease without heart failure, with stage 1 through stage 4 chronic kidney disease, or unspecified chronic kidney disease: Secondary | ICD-10-CM | POA: Diagnosis not present

## 2021-02-23 DIAGNOSIS — D631 Anemia in chronic kidney disease: Secondary | ICD-10-CM | POA: Diagnosis not present

## 2021-02-23 DIAGNOSIS — E1169 Type 2 diabetes mellitus with other specified complication: Secondary | ICD-10-CM | POA: Diagnosis not present

## 2021-02-23 DIAGNOSIS — I2510 Atherosclerotic heart disease of native coronary artery without angina pectoris: Secondary | ICD-10-CM | POA: Diagnosis not present

## 2021-02-23 DIAGNOSIS — N400 Enlarged prostate without lower urinary tract symptoms: Secondary | ICD-10-CM | POA: Diagnosis not present

## 2021-02-23 DIAGNOSIS — I951 Orthostatic hypotension: Secondary | ICD-10-CM | POA: Diagnosis not present

## 2021-02-23 DIAGNOSIS — I69320 Aphasia following cerebral infarction: Secondary | ICD-10-CM | POA: Diagnosis not present

## 2021-02-23 DIAGNOSIS — E1165 Type 2 diabetes mellitus with hyperglycemia: Secondary | ICD-10-CM | POA: Diagnosis not present

## 2021-02-23 DIAGNOSIS — N189 Chronic kidney disease, unspecified: Secondary | ICD-10-CM | POA: Diagnosis not present

## 2021-02-23 DIAGNOSIS — I69351 Hemiplegia and hemiparesis following cerebral infarction affecting right dominant side: Secondary | ICD-10-CM | POA: Diagnosis not present

## 2021-02-24 ENCOUNTER — Encounter: Payer: Self-pay | Admitting: Adult Health

## 2021-02-24 ENCOUNTER — Ambulatory Visit (INDEPENDENT_AMBULATORY_CARE_PROVIDER_SITE_OTHER): Payer: Medicare Other | Admitting: Adult Health

## 2021-02-24 ENCOUNTER — Other Ambulatory Visit: Payer: Self-pay

## 2021-02-24 VITALS — BP 152/78 | HR 70 | Ht 68.0 in | Wt 164.0 lb

## 2021-02-24 DIAGNOSIS — I639 Cerebral infarction, unspecified: Secondary | ICD-10-CM

## 2021-02-24 DIAGNOSIS — Z7901 Long term (current) use of anticoagulants: Secondary | ICD-10-CM

## 2021-02-24 NOTE — Progress Notes (Signed)
[facility] [address] [place]. [place] [address] [phone]       STROKE FOLLOW UP NOTE  [name] Date of Birth:  1929/01/02 Medical Record Number:  [id number]   Reason for Referral: stroke follow up    SUBJECTIVE:   CHIEF COMPLAINT:  Chief Complaint  Patient presents with   [name]    Rm 3 with  son Thomas [name] Pt is well, has been having BP complications but overall stable from stroke standpoint      HPI:   Today, 02/24/2021, [name] returns for 55-monthstroke [name].  Overall doing well.  Denies new stroke/TIA symptoms. Routinely followed by cardiology who changed warfarin to apixaban and stopped aspirin (due to increased falls) in June.  He has remained on apixaban as well as Crestor without side effects.  Blood pressure today 152/78. BP has been relatively stable on carvedilol but at times will forget to check BP in the AM prior to taking has has caused symptomatic hypotension. Current use of carvedilol only if blood pressure elevated.  Working with PT twice weekly for generalized strengthening and mobility with benefit.  No further concerns at this time.    History provided for reference purposes only Initial visit 10/16/2020 JM: [name] accompanied by his son  He has recovered well from stroke standpoint currently back at baseline.  He returned back home from SNF rehab after 2 weeks.  History of prior stroke with residual right-sided deficits which are currently at baseline.  [facility] and ambulates with rolling walker.  Denies new stroke/TIA symptoms  Remains on aspirin and Coumadin with mild bruising but no bleeding Remains on Crestor without associated side effects Blood pressure today initially elevated and on recheck 132/92 -fluctuation of blood pressure remains his largest concern at today's visit.  This has been a ongoing issue over the past 3 years per son.   He was evaluated in the ED over the weekend due to syncopal episode in setting of hypotension with BP 60s/30s.  He decreased his carvedilol dosage and also remains on Florinef daily - wife tries to check BP daily but per son, will occasionally forget. Per son, wife tried to schedule sooner [name] visit with cardiology but no sooner visit available -currently scheduled 6/2.  Glucose levels monitored at home which have been elevated - he was recently advised by PCP to check his blood sugars in the morning for accurate fasting levels as he currently monitors in the evening - also recently increase Lantus dosage.  No further concerns at this time  Stroke admission 08/31/2020 Mr. AAristidis Talericois a 87y.o. male with history of coagulopathy, CAD, DM, HTN, HLD, coagulopathy on coumadin, chronic strokes with residual right sided deficits, right facial droop.  [facility] confusion and "vision" problems in setting of low BP.  Personally reviewed hospitalization pertinent progress notes, lab work and imaging with summary provided.  Evaluated by Dr. XErlinda Hongwith stroke work-up revealing right occipital infarct likely secondary to diffuse severe intracranial stenosis in setting of low BP or orthostatic hypotension. Recommended continuation of aspirin and warfarin at discharge.  History of HTN as well as orthostatic hypotension with long-term BP goal 130-150 to ensure adequate perfusion with initiation of fludrocortisone with recent home readings 80-90s/40-50s.  Chronic anticoagulation on warfarin for over 20 years due to history of DVT and PE - per note review, oncologist would like to  change from warfarin to Eliquis but pt has yet to discuss this with his cardiologist. Pt reported difficulty to control INR levels -recommended outpatient [name] for further discussion with cardiologist.  LDL 37 on Crestor 20 mg daily.  A1c 7.5 on Lantus on [place].  Other stroke risk factors include advanced  age, history of stroke 2010 and CAD and MI s/p CABG.  Other active problems include SSS s/p pacemaker and multiple myeloma work-up with oncology.  Evaluated by therapies and recommended discharge to SNF for ongoing therapy needs   Stroke - R occipital infarct, etiology likely secondary to diffuse severe intracranial stenosis in setting of low BP or orthostatic hypotension. However, hypercoagulable or occult afib with fluctuating INR is also possible CTH: early subacute right PCA distribution stroke. CTA head & neck severe intracranial atherosclerosis with widespread and high-grade stenoses. Right ICA bulb 60% stenosis MRI Small to moderate size acute infarction in the inferior right occipital lobe. No hemorrhage. 2D Echo 08/08/20 EF 60-65% LDL 37 HgbA1c 7.5 VTE prophylaxis - coumadin ASA + Coumadin prior to admission, now on ASA 81 and coumadin.  Therapy recommendations:  SNF Disposition:  SNF       ROS:   14 system review of systems performed and negative with exception of those listed in HPI  PMH:  Past Medical History:  Diagnosis Date   Arthritis    Clotting disorder ([place])    Coronary artery disease    Diabetes ([place])    Hyperlipidemia    Hypertension    Hypothyroidism 07/17/2020   Kidney disease    Loss of hearing    Macular degeneration    Myocardial infarction ([place])    Osteoarthritis    Stroke ([place])     PSH:  Past Surgical History:  Procedure Laterality Date   CARPAL TUNNEL RELEASE  2011   CORONARY ANGIOPLASTY WITH STENT PLACEMENT     CORONARY ARTERY BYPASS GRAFT  2001   x6   REPLACEMENT TOTAL KNEE     SPINE SURGERY  2008   cervical and lumbar   THYROIDECTOMY      Social History:  Social History   Socioeconomic History   Marital status: Married    Spouse name: Not on file   Number of children: Not on file   Years of education: Not on file   Highest education level: Not on file  Occupational History   Not on file  Tobacco Use   Smoking status: Never    Smokeless tobacco: Never  Substance and Sexual Activity   Alcohol use: Never   Drug use: Never   Sexual activity: Not on file  Other Topics Concern   Not on file  Social History Narrative   Not on file   Social Determinants of Health   Financial Resource Strain: Not on file  Food Insecurity: Not on file  Transportation Needs: Not on file  Physical Activity: Not on file  Stress: Not on file  Social Connections: Not on file  Intimate Partner Violence: Not on file    Family History:  Family History  Problem Relation Age of Onset   Stroke Mother    Stroke Father    Heart disease Sister    Cancer Daughter    Hypertension Son    Heart disease Brother    Cancer Brother    Heart disease Brother     Medications:   Current Outpatient Medications on File Prior to Visit  Medication Sig Dispense Refill   acetaminophen (TYLENOL) 325 MG tablet  Take 650 mg by mouth every 6 (six) hours as needed for moderate pain or headache.     apixaban (ELIQUIS) 5 MG TABS tablet Take 1 tablet (5 mg total) by mouth 2 (two) times daily. 180 tablet 3   b complex vitamins capsule Take 1 capsule by mouth daily.     carvedilol (COREG) 12.5 MG tablet Take 1 tablet (12.5 mg total) by mouth 2 (two) times daily with a meal. 180 tablet 1   cholecalciferol (VITAMIN D3) 25 MCG (1000 UNIT) tablet Take 1,000 Units by mouth daily.     co-enzyme Q-10 50 MG capsule Take 50 mg by mouth daily.     ferrous sulfate 325 (65 FE) MG EC tablet Take 1 tablet (325 mg total) by mouth daily with breakfast. 30 tablet 3   glucosamine-chondroitin 500-400 MG tablet Take 1 tablet by mouth 3 (three) times daily.     insulin glargine (LANTUS) 100 unit/mL SOPN Inject 15 Units into the skin daily. 15 mL 3   Insulin Pen Needle 31G X 5 MM MISC 1 each by Does not apply route daily. Please use a new pen needle daily with Lantus injection 100 each 3   levothyroxine (SYNTHROID) 100 MCG tablet TAKE 1 TABLET(100 MCG) BY MOUTH DAILY 90 tablet 0    mirtazapine (REMERON) 7.5 MG tablet Take 1 tablet (7.5 mg total) by mouth at bedtime. 90 tablet 0   Multiple Vitamin (MULTIVITAMIN) tablet Take 1 tablet by mouth daily.     Red Yeast Rice Extract (RED YEAST RICE PO) Take 1 capsule by mouth daily.     rosuvastatin (CRESTOR) 20 MG tablet Take 1 tablet (20 mg total) by mouth at bedtime. 90 tablet 3   tamsulosin (FLOMAX) 0.4 MG CAPS capsule Take 0.4 mg by mouth daily after supper.     No current facility-administered medications on file prior to visit.    Allergies:   Allergies  Allergen Reactions   Amitriptyline    Effexor Xr [Venlafaxine]    Lyrica [Pregabalin]    Morphine    Morphine And Related    Pravachol [Pravastatin]    Zolpidem       OBJECTIVE:  Physical Exam  Vitals:   02/24/21 1325  BP: (!) 152/78  Pulse: 70  Weight: 164 lb (74.4 kg)  Height: '5\' 8"'  (1.727 m)    Body mass index is 24.94 kg/m. No results found.  General: frail very pleasant elderly Caucasian male, seated, in no evident distress Head: head normocephalic and atraumatic.   Neck: supple with no carotid or supraclavicular bruits Cardiovascular: regular rate and rhythm, no murmurs Musculoskeletal: no deformity Skin:  no rash/petichiae Vascular:  Normal pulses all extremities   Neurologic Exam Mental Status: Awake and fully alert.  Fluent speech and language.  Oriented to place and time. Recent and remote memory intact. Attention span, concentration and fund of knowledge appropriate. Mood and affect appropriate.  Cranial Nerves: Pupils equal, briskly reactive to light. Extraocular movements full without nystagmus. Visual fields difficulty to fully assess with poor vision at baseline.  HOH bilaterally. Facial sensation intact.  Mild right nasolabial fold flattening.  Tongue, palate moves normally and symmetrically.  Motor: Chronic mild right hemiparesis otherwise strength intact  Sensory.:  Intact to light touch, vibratory and pinprick sensation  throughout Coordination: Rapid alternating movements normal in all extremities. Finger-to-nose and heel-to-shin performed accurately bilaterally. Gait and Station: Deferred as rolling walker not present Reflexes: 1+ and symmetric. Toes downgoing.  ASSESSMENT: Bradshaw Minihan is a 87 y.o. year old male presented with confusion and " visual problems" in setting of low BP on 08/31/2020 with stroke work-up revealing right occipital infarct likely secondary to diffuse severe intracranial stenosis in setting of low BP or orthostatic hypotension. Vascular risk factors include history of coagulopathy with DVT and PE on chronic warfarin, CAD, HTN with orthostatic hypotension, HLD, DM and history of chronic strokes with residual right-sided deficits.      PLAN:  Right occipital stroke:  Recovered well without residual deficit.   Continue Eliquis (apixaban) daily  and Crestor for secondary stroke prevention.   Discussed secondary stroke prevention measures and importance of close PCP follow up for aggressive stroke risk factor management  HTN with orthostatic hypotension: BP goal between 130-150 given severe intracranial stenosis.  Currently on carvedilol per cards taking only if BP elevated.  Routinely monitors at home and [name] closely by cardiology HLD: LDL goal <70. Recent LDL 37 on Crestor 20 mg daily per PCP.  DMII: A1c goal<7.0. Recent A1c 7.6 on Lantus.  Managed by PCP   Follow up in 6 months or call earlier if needed   CC:  [facility] provider: Dr. Leonie Man PCP: Vivi Barrack, MD    I spent 36 minutes of face-to-face and non-face-to-face time with patient and son.  This included previsit chart review, lab review, study review, order entry, electronic health record documentation, patient and son education regarding prior stroke and etiology, importance of managing stroke risk factors as well as adequate blood pressure control, anticoagulation use, and answered all other questions to  patient and sons satisfaction  Frann Rider, AGNP-BC  [facility] [address] [place], [place] [address]  Phone [phone] Fax [phone] Note: This document was prepared with digital dictation and possible smart phrase technology. Any transcriptional errors that result from this process are unintentional.

## 2021-02-24 NOTE — Patient Instructions (Signed)
Continue Eliquis (apixaban) daily  and Crestor  for secondary stroke prevention  Continue to follow up with PCP regarding cholesterol and blood pressure management  Maintain strict control of hypertension with blood pressure goal between 130-150s and cholesterol with LDL cholesterol (bad cholesterol) goal below 70 mg/dL.       Followup in the future with me in 6 months or call earlier if needed       Thank you for coming to see [place] at [facility]. I hope we have been able to provide you high quality care today.  You may receive a patient satisfaction survey over the next few weeks. We would appreciate your feedback and comments so that we may continue to improve ourselves and the health of our patients.

## 2021-03-05 ENCOUNTER — Encounter (HOSPITAL_BASED_OUTPATIENT_CLINIC_OR_DEPARTMENT_OTHER): Payer: Self-pay

## 2021-03-06 ENCOUNTER — Telehealth: Payer: Self-pay | Admitting: Pharmacist

## 2021-03-06 NOTE — Progress Notes (Addendum)
Chronic Care Management Pharmacy Assistant   Name: Thomas [name]  MRN: [id number] DOB: [birth year]  Thomas [name] is an 87 y.o. year old male who presents for his initial CCM visit with the clinical pharmacist.  Reason for Encounter: Initial chart review with the clinical pharmacist.   Conditions to be addressed/monitored: CAD, VTE, Hypertension, TIA, CVA, Orthostatic Hypotension, Hypothyroidism, DM 2, Osteoarthritis, BPH, Vitamin D deficiency, Macular degeneration, Insomnia,   Primary concerns for visit include: Hypertension, DM  Recent office visits:  10/01/20 [name] MD Acute CVA, Increased Lantus Insuline from 13 units to 15 units SubQ Daily. Follow up in 3 months.  10/30/20 Unk Lightning RN, Acute Visit, CUP PACEART REMOTE DEVICE CHECK, no med changes and no follow ups on file. 12/31/20 [name] MD, Restarted low dose Coreg. Follow up in 3 months.  Recent consult visits:  10/16/20 Neurology Ihor Austin NP, Stroke, No med changes. Follow up in 4 months. 10/23/20  Oncology/Telephone Message, Mertha Finders RN. Started Ferrous Sulfate 325 mg daily. No follow ups on file. 11/27/20 Cardiology Laurance Flatten, CAD, Stopped Aspirin, Coumadin, Coreg, Florinef Started Eliquis 5 mg 1 tablet by mouth 2 times daily. Follow up 2-3 months. 01/08/21 Cardiology, Laurance Flatten MD, Resumed Coreg 12.5 mg BID. Follow up 3-4 months. 02/24/21 Neurology McCue, Shanda Bumps NP, Stroke,  No med changes. Follow up in 6 months.  Hospital visits:  10/11/20 Pieter Partridge MD Wonda Olds seen for Syncope. Started on Carvedilol 12.5 mg 2 times a day. No follow ups on file.  Medications: Outpatient Encounter Medications as of 03/06/2021  Medication Sig   acetaminophen (TYLENOL) 325 MG tablet Take 650 mg by mouth every 6 (six) hours as needed for moderate pain or headache.   apixaban (ELIQUIS) 5 MG TABS tablet Take 1 tablet (5 mg total) by mouth 2 (two) times daily.   b complex vitamins  capsule Take 1 capsule by mouth daily.   carvedilol (COREG) 12.5 MG tablet Take 1 tablet (12.5 mg total) by mouth 2 (two) times daily with a meal.   cholecalciferol (VITAMIN D3) 25 MCG (1000 UNIT) tablet Take 1,000 Units by mouth daily.   co-enzyme Q-10 50 MG capsule Take 50 mg by mouth daily.   ferrous sulfate 325 (65 FE) MG EC tablet Take 1 tablet (325 mg total) by mouth daily with breakfast.   glucosamine-chondroitin 500-400 MG tablet Take 1 tablet by mouth 3 (three) times daily.   insulin glargine (LANTUS) 100 unit/mL SOPN Inject 15 Units into the skin daily.   Insulin Pen Needle 31G X 5 MM MISC 1 each by Does not apply route daily. Please use a new pen needle daily with Lantus injection   levothyroxine (SYNTHROID) 100 MCG tablet TAKE 1 TABLET(100 MCG) BY MOUTH DAILY   mirtazapine (REMERON) 7.5 MG tablet Take 1 tablet (7.5 mg total) by mouth at bedtime.   Multiple Vitamin (MULTIVITAMIN) tablet Take 1 tablet by mouth daily.   Red Yeast Rice Extract (RED YEAST RICE PO) Take 1 capsule by mouth daily.   rosuvastatin (CRESTOR) 20 MG tablet Take 1 tablet (20 mg total) by mouth at bedtime.   tamsulosin (FLOMAX) 0.4 MG CAPS capsule Take 0.4 mg by mouth daily after supper.   No facility-administered encounter medications on file as of 03/06/2021.   Current Medications:  Levothyroxine 10 mcg last filled on 02/23/21 90ds Carvedilol 12.5 last filled on 01/08/21 90ds Mirtazapine 7.5 last filled on 12/09/20 90ds Eliquis 5 mg last filled on 02/20/21 30ds Ferrous Sulfate 325  mg last filled 02/07/21 30ds Rosuvastatin 20 mg last filled on 01/24/21 90ds Lantus 100 units last filled on 01/27/21 69 ds Co Q-10 50 mg Vitamin D3 1000units Glucosamine 500-400 mg Multiple Vitamin Red Yeast Rice B Complex Tylenol 325 mg Flomax 0.4 mg last filled 01/13/21 90ds   Patient Questions:  Any changes in your medications or health? Pt wife stated no issues   Any side effects from any medications?  Pt wife stated no  side effects  Do you have any symptoms or problems not managed by your medications?  Pt wife stated none  Any concerns about your health right now? Pt wife stated she had no concerns  Has your provider asked that you check blood pressure, blood sugar, or follow special diet at home? Pt checks BP and Blood sugars daily. 03/06/21 - BP 141/90 and 117/66. 03/06/21 Blood Sugars 120 range before breakfast.  Do you get any type of exercise on a regular basis? Patient has physical therapy every Monday and does physical therapy at home every other day.  Can you think of a goal you would like to reach for your health? Patient wife stated he is doing pretty good with current plan  Do you have any problems getting your medications? No issues with getting medications   Is there anything that you would like to discuss during the appointment?  Pt wife stated she had nothing else to add  Please bring medications and supplements to appointment I reminded patient's wife to have medications handy during appointment and she understood.   Care Gaps: Medicare Annual Wellness: Not on list  Ophthalmology Exam:Never done Foot Exam: Never done Hemoglobin A1C: 7/22 (7.6) Colonoscopy: Not available  Dexa Scan: Not available    Star Rating Drugs: Rosuvastatin 20 mg last filled on 01/24/21 90ds   Future Appointments  Date Time Provider Department Center  03/10/2021  9:00 AM [facility] CCM PHARMACIST [facility]  04/02/2021  1:20 PM Ardith Dark, MD [facility]  04/14/2021  1:30 PM Alver Sorrow, NP [facility]  04/30/2021  7:00 AM [facility] DEVICE REMOTES [facility]  07/30/2021  7:00 AM [facility] DEVICE REMOTES [facility]  08/25/2021 12:45 PM Ihor Austin, NP [facility] None  10/29/2021  7:00 AM [facility] DEVICE REMOTES [facility]  01/28/2022  7:00 AM [facility] DEVICE REMOTES [facility]      Roxana Hires CMA  [facility]

## 2021-03-06 NOTE — Progress Notes (Signed)
Chronic Care Management Pharmacy Note  03/10/2021 Name:  Thomas [name] MRN:  [id number] DOB:  1929-03-20  Summary: Initial visit with PharmD.  Patient doing well for age, all chronic conditions well managed.  Meds reviewed and list was current.  Recommendations/Changes made from today's visit: Due for diabetic foot/eye exam  Plan: FU 6 months   Subjective: Thomas [name] is an 87 y.o. year old male who is a primary patient of [name], [name], MD.  The CCM team was consulted for assistance with disease management and care coordination needs.    Engaged with patient by telephone for initial visit in response to provider referral for pharmacy case management and/or care coordination services.   Consent to Services:  The patient was given the following information about Chronic Care Management services today, agreed to services, and gave verbal consent: 1. CCM service includes personalized support from designated clinical staff supervised by the primary care provider, including individualized plan of care and coordination with other care providers 2. 24/7 contact phone numbers for assistance for urgent and routine care needs. 3. Service will only be billed when office clinical staff spend 20 minutes or more in a month to coordinate care. 4. Only one practitioner may furnish and bill the service in a calendar month. 5.The patient may stop CCM services at any time (effective at the end of the month) by phone call to the office staff. 6. The patient will be responsible for cost sharing (co-pay) of up to 20% of the service fee (after annual deductible is met). Patient agreed to services and consent obtained.  Patient Care Team: Vivi Barrack, MD as PCP - General (Family Medicine) Edythe Clarity, [facility] as Pharmacist (Pharmacist)  Recent office visits:  10/01/20 Dimas Chyle MD Acute CVA, Increased Lantus Insuline from 13 units to 15 units SubQ Daily. Follow up in 3 months.  10/30/20  Roma Kayser RN, Acute Visit, CUP PACEART REMOTE DEVICE CHECK, no med changes and no follow ups on file. 12/31/20 Dimas Chyle MD, Restarted low dose Coreg. Follow up in 3 months.   Recent consult visits:  10/16/20 Neurology Frann Rider NP, Stroke, No med changes. Follow up in 4 months. 10/23/20  Oncology/Telephone Message, Aura Fey RN. Started Ferrous Sulfate 325 mg daily. No follow ups on file. 11/27/20 Cardiology Gwyndolyn Kaufman, CAD, Stopped Aspirin, Coumadin, Coreg, Florinef Started Eliquis 5 mg 1 tablet by mouth 2 times daily. Follow up 2-3 months. 01/08/21 Cardiology, Gwyndolyn Kaufman MD, Resumed Coreg 12.5 mg BID. Follow up 3-4 months. 02/24/21 Neurology McCue, Janett Billow NP, Stroke,  No med changes. Follow up in 6 months.   Hospital visits:  10/11/20 Lorre Munroe MD Elvina Sidle seen for Syncope. Started on Carvedilol 12.5 mg 2 times a day. No follow ups on file.   Objective:  Lab Results  Component Value Date   CREATININE 0.97 10/11/2020   BUN 21 10/11/2020   GFR 52.56 (L) 08/21/2020   GFRNONAA >60 10/11/2020   GFRAA 66 08/07/2020   NA 139 10/11/2020   K 4.0 10/11/2020   CALCIUM 8.3 (L) 10/11/2020   CO2 22 10/11/2020   GLUCOSE 229 (H) 10/11/2020    Lab Results  Component Value Date/Time   HGBA1C 7.6 (A) 12/31/2020 10:55 AM   HGBA1C 7.5 (H) 08/31/2020 05:23 AM   HGBA1C 7.1 (H) 07/17/2020 10:42 AM   HGBA1C 7.7 12/03/2019 12:00 AM   GFR 52.56 (L) 08/21/2020 11:43 AM   GFR 49.18 (L) 07/29/2020 10:52 AM    Last  diabetic Eye exam: No results found for: HMDIABEYEEXA  Last diabetic Foot exam: No results found for: HMDIABFOOTEX   Lab Results  Component Value Date   CHOL 90 08/31/2020   HDL 31 (L) 08/31/2020   LDLCALC 37 08/31/2020   TRIG 108 08/31/2020   CHOLHDL 2.9 08/31/2020    Hepatic Function Latest Ref Rng & Units 10/11/2020 08/31/2020 08/27/2020  Total Protein 6.5 - 8.1 g/dL 6.3(L) 6.5 7.6  Albumin 3.5 - 5.0 g/dL 3.2(L) 3.3(L) 3.8  AST 15 - 41 U/L  '21 29 25  ' ALT 0 - 44 U/L 21 36 29  Alk Phosphatase 38 - 126 U/L 45 41 56  Total Bilirubin 0.3 - 1.2 mg/dL 0.3 0.5 0.2(L)    Lab Results  Component Value Date/Time   TSH 2.91 07/17/2020 10:42 AM   TSH 3.19 10/25/2019 12:00 AM    CBC Latest Ref Rng & Units 10/11/2020 09/08/2020 09/05/2020  WBC 4.0 - 10.5 K/uL 8.4 11.7(H) 11.4(H)  Hemoglobin 13.0 - 17.0 g/dL 10.9(L) 10.2(L) 10.9(L)  Hematocrit 39.0 - 52.0 % 34.0(L) 29.4(L) 31.3(L)  Platelets 150 - 400 K/uL 158 174 179    Lab Results  Component Value Date/Time   VD25OH 46.27 07/17/2020 10:42 AM    Clinical ASCVD: Yes  The ASCVD Risk score (Arnett DK, et al., 2019) failed to calculate for the following reasons:   The 2019 ASCVD risk score is only valid for ages 41 to 55   The patient has a prior MI or stroke diagnosis    Depression screen [facility] 2/9 10/16/2020 07/17/2020  Decreased Interest 0 0  Down, Depressed, Hopeless 0 0  PHQ - 2 Score 0 0     Social History   Tobacco Use  Smoking Status Never  Smokeless Tobacco Never   BP Readings from Last 3 Encounters:  02/24/21 (!) 152/78  01/08/21 140/72  12/31/20 (!) 98/59   Pulse Readings from Last 3 Encounters:  02/24/21 70  01/08/21 75  12/31/20 72   Wt Readings from Last 3 Encounters:  02/24/21 164 lb (74.4 kg)  01/08/21 161 lb 6.4 oz (73.2 kg)  12/31/20 162 lb (73.5 kg)   BMI Readings from Last 3 Encounters:  02/24/21 24.94 kg/m  01/08/21 24.54 kg/m  12/31/20 24.63 kg/m    Assessment/Interventions: Review of patient past medical history, allergies, medications, health status, including review of consultants reports, laboratory and other test data, was performed as part of comprehensive evaluation and provision of chronic care management services.   SDOH:  (Social Determinants of Health) assessments and interventions performed: Yes  Financial Resource Strain: Low Risk    Difficulty of Paying Living Expenses: Not very hard    SDOH Screenings   Alcohol Screen:  Not on file  Depression (PHQ2-9): Low Risk    PHQ-2 Score: 0  Financial Resource Strain: Low Risk    Difficulty of Paying Living Expenses: Not very hard  Food Insecurity: Not on file  Housing: Not on file  Physical Activity: Not on file  Social Connections: Not on file  Stress: Not on file  Tobacco Use: Low Risk    Smoking Tobacco Use: Never   Smokeless Tobacco Use: Never  Transportation Needs: Not on file    CCM Care Plan  Allergies  Allergen Reactions   Amitriptyline    Effexor Xr [Venlafaxine]    Lyrica [Pregabalin]    Morphine    Morphine And Related    Pravachol [Pravastatin]    Zolpidem     Medications Reviewed Today  Reviewed by Edythe Clarity, RPH (Pharmacist) on 03/10/21 at [place] List Status: <None>   Medication Order Taking? Sig Documenting Provider Last Dose Status Informant  acetaminophen (TYLENOL) 325 MG tablet [id number] Yes Take 650 mg by mouth every 6 (six) hours as needed for moderate pain or headache. [provider] Taking Active Spouse/Significant Other  apixaban (ELIQUIS) 5 MG TABS tablet [id number] Yes Take 1 tablet (5 mg total) by mouth 2 (two) times daily. Freada Bergeron, MD Taking Active   b complex vitamins capsule [id number] Yes Take 1 capsule by mouth daily. [provider] Taking Active Spouse/Significant Other  carvedilol (COREG) 12.5 MG tablet [id number] Yes Take 1 tablet (12.5 mg total) by mouth 2 (two) times daily with a meal. Freada Bergeron, MD Taking Active   cholecalciferol (VITAMIN D3) 25 MCG (1000 UNIT) tablet [id number] Yes Take 1,000 Units by mouth daily. [provider] Taking Active Spouse/Significant Other  co-enzyme Q-10 50 MG capsule [id number] Yes Take 50 mg by mouth daily. [provider] Taking Active Spouse/Significant Other  ferrous sulfate 325 (65 FE) MG EC tablet [id number] Yes Take 1 tablet (325 mg total) by mouth daily with breakfast. Orson Slick, MD Taking Active    glucosamine-chondroitin 500-400 MG tablet [id number] Yes Take 1 tablet by mouth 3 (three) times daily. [provider] Taking Active Spouse/Significant Other  insulin glargine (LANTUS) 100 unit/mL SOPN [id number] Yes Inject 15 Units into the skin daily. Vivi Barrack, MD Taking Active   Insulin Pen Needle 31G X 5 MM MISC [id number] Yes 1 each by Does not apply route daily. Please use a new pen needle daily with Lantus injection Vivi Barrack, MD Taking Active   levothyroxine (SYNTHROID) 100 MCG tablet [id number] Yes TAKE 1 TABLET(100 MCG) BY MOUTH DAILY Vivi Barrack, MD Taking Active   mirtazapine (REMERON) 7.5 MG tablet [id number] Yes Take 1 tablet (7.5 mg total) by mouth at bedtime. Vivi Barrack, MD Taking Active   Multiple Vitamin (MULTIVITAMIN) tablet [id number] Yes Take 1 tablet by mouth daily. [provider] Taking Active Spouse/Significant Other  Red Yeast Rice Extract (RED YEAST RICE PO) [id number] Yes Take 1 capsule by mouth daily. [provider] Taking Active Spouse/Significant Other  rosuvastatin (CRESTOR) 20 MG tablet [id number] Yes Take 1 tablet (20 mg total) by mouth at bedtime. Freada Bergeron, MD Taking Active   tamsulosin [facility]) 0.4 MG CAPS capsule [id number] Yes Take 0.4 mg by mouth daily after supper. [provider] Taking Active Spouse/Significant Other            Patient Active Problem List   Diagnosis Date Noted   Orthostatic hypotension 10/01/2020   TIA (transient ischemic attack) 08/31/2020   Normocytic anemia 08/31/2020   Prolonged QT interval 08/31/2020   Acute CVA (cerebrovascular accident) ([place]) 08/31/2020   Debility 08/21/2020   Lytic bone lesion of femur 08/21/2020   Sick sinus syndrome ([place]) 07/31/2020   Coronary artery disease s/p CABG 2001 07/17/2020   Hypothyroidism 07/17/2020   Venous thromboembolism (VTE) 07/17/2020   Hypertension 07/17/2020   Cardiac pacemaker 07/17/2020   Osteoarthritis 07/17/2020    Type 2 diabetes mellitus with hyperglycemia ([place]) 07/17/2020   Vitamin D deficiency 07/17/2020   Macular degeneration 07/17/2020   Dyslipidemia due to type 2 diabetes mellitus ([place]) 07/17/2020   Insomnia 07/17/2020   BPH (benign prostatic hyperplasia) 07/17/2020   Colon polyps 07/17/2020    Immunization History  Administered Date(s) Administered   Influenza-Unspecified 03/17/2020  Moderna Sars-Covid-2 Vaccination 09/04/2019, 10/03/2019   Zoster Recombinat (Shingrix) 06/28/2012    Conditions to be addressed/monitored:  CAD, HTN, Hx of CVA, Hypothyroidism, Type II DM, Insomnia  Care Plan : General Pharmacy (Adult)  Updates made by Edythe Clarity, RPH since 03/10/2021 12:00 AM     Problem: CAD, HTN, Hx of CVA, Hypothyroidism, Type II DM, Insomnia   Priority: High  Onset Date: 03/10/2021     Long-Range Goal: Patient-Specific Goal   Start Date: 03/10/2021  Expected End Date: 09/07/2021  This Visit's Progress: On track  Priority: High  Note:   Current Barriers:  None at this time  Pharmacist Clinical Goal(s):  Patient will maintain control of glucose and BP as evidenced by home monitoring  through collaboration with PharmD and provider.   Interventions: 1:1 collaboration with Vivi Barrack, MD regarding development and update of comprehensive plan of care as evidenced by provider attestation and co-signature Inter-disciplinary care team collaboration (see longitudinal plan of care) Comprehensive medication review performed; medication list updated in electronic medical record  Hypertension/Hx of TIA (BP goal <140/90) -Controlled -Current treatment: Carvedilol 12.70m BID Eliquis 538mBID -Medications previously tried: ramipril, spironolactone  -Current home readings: 120/70s AM,  -Current dietary habits: good appetite -Current exercise habits: doing physical therapy -Denies hypotensive/hypertensive symptoms -Educated on BP goals and benefits of medications for  prevention of heart attack, stroke and kidney damage; Importance of home blood pressure monitoring; Symptoms of hypotension and importance of maintaining adequate hydration; -Counseled to monitor BP at home daily, document, and provide log at future appointments -At direction of cardiology, patient is monitoring BP and only taking Coreg if BP elevated to reduce risk of hypotension/dizziness -Recommended to continue current medication  Diabetes (A1c goal <8) -Controlled -Current medications: Lantus 15 units  -Medications previously tried: glimepiride  -Current home glucose readings fasting glucose: 120s post prandial glucose: N/A -Denies hypoglycemic/hyperglycemic symptoms -No glucose readings < 80 -Educated on A1c and blood sugar goals; Prevention and management of hypoglycemic episodes; -Counseled to check feet daily and get yearly eye exams -Due for both foot and eye exam -Recommended to continue current medication Notify providers if have any episodes of hypoglycemia - now more important that elevated glucose as long as it is not severely elevated  Hypothyroidism (Goal: Maintain TSH) -Controlled -Current treatment  Levothyroxine 10077mdaily -Medications previously tried: none noted -Takes appropriately, TSH is normal  -Recommended to continue current medication  Insomnia (Goal: Adequate sleep) -Controlled -Current treatment  Mirtazapine 7.5mg37mily -Medications previously tried: none -Sleep is reported as good for the most part, also added benefit of appetite stimulation with mirtazapine  -Recommended to continue current medication  Patient Goals/Self-Care Activities Patient will:  - take medications as prescribed check glucose daily, document, and provide at future appointments check blood pressure daily, document, and provide at future appointments  Follow Up Plan: The care management team will reach out to the patient again over the next 180 days.        Medication Assistance: None required.  Patient affirms current coverage meets needs.  Compliance/Adherence/Medication fill history: Care Gaps: Foot exam Eye exam  Star-Rating Drugs: Rosuvastatin 20mg20mly 01/24/21 90ds  Patient's [facility] pharmacy is:  [id number] [address] [id number]: [phone] pill box? Yes Pt endorses 100% compliance  We discussed: Current pharmacy is preferred with insurance plan  and patient is satisfied with pharmacy services Patient decided to: Continue current medication management strategy  Care Plan and Follow Up Patient Decision:  Patient agrees to Care Plan and Follow-up.  Plan: The care management team will reach out to the patient again over the next 180 days.  Beverly Milch, PharmD Clinical Pharmacist [phone]

## 2021-03-10 ENCOUNTER — Ambulatory Visit (INDEPENDENT_AMBULATORY_CARE_PROVIDER_SITE_OTHER): Payer: Medicare Other | Admitting: Pharmacist

## 2021-03-10 DIAGNOSIS — E1165 Type 2 diabetes mellitus with hyperglycemia: Secondary | ICD-10-CM

## 2021-03-10 DIAGNOSIS — G4700 Insomnia, unspecified: Secondary | ICD-10-CM

## 2021-03-10 DIAGNOSIS — E1159 Type 2 diabetes mellitus with other circulatory complications: Secondary | ICD-10-CM

## 2021-03-10 DIAGNOSIS — E039 Hypothyroidism, unspecified: Secondary | ICD-10-CM

## 2021-03-10 NOTE — Patient Instructions (Addendum)
Visit Information   Goals Addressed             This Visit's Progress    Set My Target A1C-Diabetes Type 2       Timeframe:  Long-Range Goal Priority:  High Start Date:   03/10/21                          Expected End Date: 09/07/21                      Follow Up Date 06/09/21    - set target A1C  Maintain A1c < 8.0, with no hypoglycemia    Why is this important?   Your target A1C is decided together by you and your doctor.  It is based on several things like your age and other health issues.    Notes:        Patient Care Plan: General Pharmacy (Adult)     Problem Identified: CAD, HTN, Hx of CVA, Hypothyroidism, Type II DM, Insomnia   Priority: High  Onset Date: 03/10/2021     Long-Range Goal: Patient-Specific Goal   Start Date: 03/10/2021  Expected End Date: 09/07/2021  This Visit's Progress: On track  Priority: High  Note:   Current Barriers:  None at this time  Pharmacist Clinical Goal(s):  Patient will maintain control of glucose and BP as evidenced by home monitoring  through collaboration with PharmD and provider.   Interventions: 1:1 collaboration with Ardith Dark, MD regarding development and update of comprehensive plan of care as evidenced by provider attestation and co-signature Inter-disciplinary care team collaboration (see longitudinal plan of care) Comprehensive medication review performed; medication list updated in electronic medical record  Hypertension/Hx of TIA (BP goal <140/90) -Controlled -Current treatment: Carvedilol 12.5mg  BID Eliquis 5mg  BID -Medications previously tried: ramipril, spironolactone  -Current home readings: 120/70s AM,  -Current dietary habits: good appetite -Current exercise habits: doing physical therapy -Denies hypotensive/hypertensive symptoms -Educated on BP goals and benefits of medications for prevention of heart attack, stroke and kidney damage; Importance of home blood pressure monitoring; Symptoms of  hypotension and importance of maintaining adequate hydration; -Counseled to monitor BP at home daily, document, and provide log at future appointments -At direction of cardiology, patient is monitoring BP and only taking Coreg if BP elevated to reduce risk of hypotension/dizziness -Recommended to continue current medication  Diabetes (A1c goal <8) -Controlled -Current medications: Lantus 15 units  -Medications previously tried: glimepiride  -Current home glucose readings fasting glucose: 120s post prandial glucose: N/A -Denies hypoglycemic/hyperglycemic symptoms -No glucose readings < 80 -Educated on A1c and blood sugar goals; Prevention and management of hypoglycemic episodes; -Counseled to check feet daily and get yearly eye exams -Due for both foot and eye exam -Recommended to continue current medication Notify providers if have any episodes of hypoglycemia - now more important that elevated glucose as long as it is not severely elevated  Hypothyroidism (Goal: Maintain TSH) -Controlled -Current treatment  Levothyroxine daily -Medications previously tried: none noted -Takes appropriately, TSH is normal  -Recommended to continue current medication  Insomnia (Goal: Adequate sleep) -Controlled -Current treatment  Mirtazapine 7.5mg  daily -Medications previously tried: none -Sleep is reported as good for the most part, also added benefit of appetite stimulation with mirtazapine  -Recommended to continue current medication  Patient Goals/Self-Care Activities Patient will:  - take medications as prescribed check glucose daily, document, and provide at future appointments check blood pressure  daily, document, and provide at future appointments  Follow Up Plan: The care management team will reach out to the patient again over the next 180 days.      Thomas [name] was given information about Chronic Care Management services today including:  CCM service includes  personalized support from designated clinical staff supervised by his physician, including individualized plan of care and coordination with other care providers 24/7 contact phone numbers for assistance for urgent and routine care needs. Standard insurance, coinsurance, copays and deductibles apply for chronic care management only during months in which we provide at least 20 minutes of these services. Most insurances cover these services at 100%, however patients may be responsible for any copay, coinsurance and/or deductible if applicable. This service may help you avoid the need for more expensive face-to-face services. Only one practitioner may furnish and bill the service in a calendar month. The patient may stop CCM services at any time (effective at the end of the month) by phone call to the office staff.  Patient agreed to services and verbal consent obtained.   The patient verbalized understanding of instructions, educational materials, and care plan provided today and agreed to receive a mailed copy of patient instructions, educational materials, and care plan.  Telephone follow up appointment with pharmacy team member scheduled for: 6 months  Erroll Luna, [place]  [phone]

## 2021-03-13 ENCOUNTER — Other Ambulatory Visit: Payer: Self-pay | Admitting: Hematology and Oncology

## 2021-03-16 ENCOUNTER — Inpatient Hospital Stay (HOSPITAL_COMMUNITY)
Admission: EM | Admit: 2021-03-16 | Discharge: 2021-03-23 | DRG: 312 | Disposition: A | Discharge status: Skilled Nursing Facility | Payer: Medicare Other | Source: Skilled Nursing Facility | Attending: Student | Admitting: Student

## 2021-03-16 ENCOUNTER — Other Ambulatory Visit: Payer: Self-pay

## 2021-03-16 ENCOUNTER — Emergency Department (HOSPITAL_COMMUNITY): Payer: Medicare Other

## 2021-03-16 ENCOUNTER — Telehealth: Payer: Self-pay | Admitting: Pharmacist

## 2021-03-16 DIAGNOSIS — Z96651 Presence of right artificial knee joint: Secondary | ICD-10-CM | POA: Diagnosis present

## 2021-03-16 DIAGNOSIS — R55 Syncope and collapse: Secondary | ICD-10-CM | POA: Diagnosis present

## 2021-03-16 DIAGNOSIS — D696 Thrombocytopenia, unspecified: Secondary | ICD-10-CM | POA: Diagnosis present

## 2021-03-16 DIAGNOSIS — M1712 Unilateral primary osteoarthritis, left knee: Secondary | ICD-10-CM | POA: Diagnosis not present

## 2021-03-16 DIAGNOSIS — R008 Other abnormalities of heart beat: Secondary | ICD-10-CM | POA: Diagnosis present

## 2021-03-16 DIAGNOSIS — Z823 Family history of stroke: Secondary | ICD-10-CM | POA: Diagnosis not present

## 2021-03-16 DIAGNOSIS — Z8249 Family history of ischemic heart disease and other diseases of the circulatory system: Secondary | ICD-10-CM | POA: Diagnosis not present

## 2021-03-16 DIAGNOSIS — Z951 Presence of aortocoronary bypass graft: Secondary | ICD-10-CM | POA: Diagnosis not present

## 2021-03-16 DIAGNOSIS — D689 Coagulation defect, unspecified: Secondary | ICD-10-CM | POA: Diagnosis not present

## 2021-03-16 DIAGNOSIS — H9190 Unspecified hearing loss, unspecified ear: Secondary | ICD-10-CM | POA: Diagnosis not present

## 2021-03-16 DIAGNOSIS — I495 Sick sinus syndrome: Secondary | ICD-10-CM | POA: Diagnosis present

## 2021-03-16 DIAGNOSIS — R531 Weakness: Secondary | ICD-10-CM

## 2021-03-16 DIAGNOSIS — Z8673 Personal history of transient ischemic attack (TIA), and cerebral infarction without residual deficits: Secondary | ICD-10-CM | POA: Diagnosis not present

## 2021-03-16 DIAGNOSIS — R41841 Cognitive communication deficit: Secondary | ICD-10-CM | POA: Diagnosis not present

## 2021-03-16 DIAGNOSIS — R262 Difficulty in walking, not elsewhere classified: Secondary | ICD-10-CM | POA: Diagnosis not present

## 2021-03-16 DIAGNOSIS — Z20822 Contact with and (suspected) exposure to covid-19: Secondary | ICD-10-CM | POA: Diagnosis not present

## 2021-03-16 DIAGNOSIS — R54 Age-related physical debility: Secondary | ICD-10-CM | POA: Diagnosis present

## 2021-03-16 DIAGNOSIS — I10 Essential (primary) hypertension: Secondary | ICD-10-CM | POA: Diagnosis not present

## 2021-03-16 DIAGNOSIS — I499 Cardiac arrhythmia, unspecified: Secondary | ICD-10-CM | POA: Diagnosis not present

## 2021-03-16 DIAGNOSIS — Z794 Long term (current) use of insulin: Secondary | ICD-10-CM

## 2021-03-16 DIAGNOSIS — E785 Hyperlipidemia, unspecified: Secondary | ICD-10-CM | POA: Diagnosis not present

## 2021-03-16 DIAGNOSIS — I951 Orthostatic hypotension: Principal | ICD-10-CM | POA: Diagnosis present

## 2021-03-16 DIAGNOSIS — D649 Anemia, unspecified: Secondary | ICD-10-CM | POA: Diagnosis not present

## 2021-03-16 DIAGNOSIS — E890 Postprocedural hypothyroidism: Secondary | ICD-10-CM | POA: Diagnosis present

## 2021-03-16 DIAGNOSIS — R739 Hyperglycemia, unspecified: Secondary | ICD-10-CM | POA: Diagnosis not present

## 2021-03-16 DIAGNOSIS — Z86711 Personal history of pulmonary embolism: Secondary | ICD-10-CM | POA: Diagnosis not present

## 2021-03-16 DIAGNOSIS — R001 Bradycardia, unspecified: Secondary | ICD-10-CM

## 2021-03-16 DIAGNOSIS — R2681 Unsteadiness on feet: Secondary | ICD-10-CM | POA: Diagnosis not present

## 2021-03-16 DIAGNOSIS — I2510 Atherosclerotic heart disease of native coronary artery without angina pectoris: Secondary | ICD-10-CM | POA: Diagnosis present

## 2021-03-16 DIAGNOSIS — Z79899 Other long term (current) drug therapy: Secondary | ICD-10-CM | POA: Diagnosis not present

## 2021-03-16 DIAGNOSIS — D6859 Other primary thrombophilia: Secondary | ICD-10-CM | POA: Diagnosis present

## 2021-03-16 DIAGNOSIS — Z79890 Hormone replacement therapy (postmenopausal): Secondary | ICD-10-CM

## 2021-03-16 DIAGNOSIS — I2782 Chronic pulmonary embolism: Secondary | ICD-10-CM | POA: Diagnosis not present

## 2021-03-16 DIAGNOSIS — I252 Old myocardial infarction: Secondary | ICD-10-CM

## 2021-03-16 DIAGNOSIS — I959 Hypotension, unspecified: Secondary | ICD-10-CM | POA: Diagnosis not present

## 2021-03-16 DIAGNOSIS — E1165 Type 2 diabetes mellitus with hyperglycemia: Secondary | ICD-10-CM | POA: Diagnosis present

## 2021-03-16 DIAGNOSIS — Z741 Need for assistance with personal care: Secondary | ICD-10-CM | POA: Diagnosis not present

## 2021-03-16 DIAGNOSIS — R5381 Other malaise: Secondary | ICD-10-CM | POA: Diagnosis not present

## 2021-03-16 DIAGNOSIS — Z885 Allergy status to narcotic agent status: Secondary | ICD-10-CM

## 2021-03-16 DIAGNOSIS — I152 Hypertension secondary to endocrine disorders: Secondary | ICD-10-CM | POA: Diagnosis present

## 2021-03-16 DIAGNOSIS — M6281 Muscle weakness (generalized): Secondary | ICD-10-CM | POA: Diagnosis not present

## 2021-03-16 DIAGNOSIS — Z950 Presence of cardiac pacemaker: Secondary | ICD-10-CM | POA: Diagnosis present

## 2021-03-16 DIAGNOSIS — I498 Other specified cardiac arrhythmias: Secondary | ICD-10-CM | POA: Diagnosis not present

## 2021-03-16 DIAGNOSIS — I459 Conduction disorder, unspecified: Secondary | ICD-10-CM | POA: Diagnosis not present

## 2021-03-16 DIAGNOSIS — I493 Ventricular premature depolarization: Secondary | ICD-10-CM | POA: Diagnosis not present

## 2021-03-16 DIAGNOSIS — Z7901 Long term (current) use of anticoagulants: Secondary | ICD-10-CM | POA: Diagnosis not present

## 2021-03-16 DIAGNOSIS — R278 Other lack of coordination: Secondary | ICD-10-CM | POA: Diagnosis not present

## 2021-03-16 DIAGNOSIS — E039 Hypothyroidism, unspecified: Secondary | ICD-10-CM | POA: Diagnosis not present

## 2021-03-16 DIAGNOSIS — Z888 Allergy status to other drugs, medicaments and biological substances status: Secondary | ICD-10-CM

## 2021-03-16 DIAGNOSIS — Z6825 Body mass index (BMI) 25.0-25.9, adult: Secondary | ICD-10-CM

## 2021-03-16 DIAGNOSIS — Z955 Presence of coronary angioplasty implant and graft: Secondary | ICD-10-CM

## 2021-03-16 LAB — CBC WITH DIFFERENTIAL/PLATELET
Abs Immature Granulocytes: 0.03 10*3/uL (ref 0.00–0.07)
Basophils Absolute: 0 10*3/uL (ref 0.0–0.1)
Basophils Relative: 0 %
Eosinophils Absolute: 0.2 10*3/uL (ref 0.0–0.5)
Eosinophils Relative: 2 %
HCT: 34.9 % — ABNORMAL LOW (ref 39.0–52.0)
Hemoglobin: 11.8 g/dL — ABNORMAL LOW (ref 13.0–17.0)
Immature Granulocytes: 0 %
Lymphocytes Relative: 36 %
Lymphs Abs: 3.2 10*3/uL (ref 0.7–4.0)
MCH: 32.9 pg (ref 26.0–34.0)
MCHC: 33.8 g/dL (ref 30.0–36.0)
MCV: 97.2 fL (ref 80.0–100.0)
Monocytes Absolute: 0.7 10*3/uL (ref 0.1–1.0)
Monocytes Relative: 8 %
Neutro Abs: 4.8 10*3/uL (ref 1.7–7.7)
Neutrophils Relative %: 54 %
Platelets: 156 10*3/uL (ref 150–400)
RBC: 3.59 MIL/uL — ABNORMAL LOW (ref 4.22–5.81)
RDW: 13.7 % (ref 11.5–15.5)
WBC: 8.9 10*3/uL (ref 4.0–10.5)
nRBC: 0 % (ref 0.0–0.2)

## 2021-03-16 LAB — COMPREHENSIVE METABOLIC PANEL
ALT: 22 U/L (ref 0–44)
AST: 29 U/L (ref 15–41)
Albumin: 3.3 g/dL — ABNORMAL LOW (ref 3.5–5.0)
Alkaline Phosphatase: 47 U/L (ref 38–126)
Anion gap: 10 (ref 5–15)
BUN: 19 mg/dL (ref 8–23)
CO2: 23 mmol/L (ref 22–32)
Calcium: 8.9 mg/dL (ref 8.9–10.3)
Chloride: 105 mmol/L (ref 98–111)
Creatinine, Ser: 1.16 mg/dL (ref 0.61–1.24)
GFR, Estimated: 60 mL/min — ABNORMAL LOW (ref >60)
Glucose, Bld: 273 mg/dL — ABNORMAL HIGH (ref 70–99)
Potassium: 4 mmol/L (ref 3.5–5.1)
Sodium: 138 mmol/L (ref 135–145)
Total Bilirubin: 0.2 mg/dL — ABNORMAL LOW (ref 0.3–1.2)
Total Protein: 6.2 g/dL — ABNORMAL LOW (ref 6.5–8.1)

## 2021-03-16 LAB — HEMOGLOBIN A1C
Hgb A1c MFr Bld: 7.4 % — ABNORMAL HIGH (ref 4.8–5.6)
Mean Plasma Glucose: 165.68 mg/dL

## 2021-03-16 LAB — MAGNESIUM: Magnesium: 1.8 mg/dL (ref 1.7–2.4)

## 2021-03-16 LAB — TSH: TSH: 3.529 u[IU]/mL (ref 0.350–4.500)

## 2021-03-16 LAB — CBG MONITORING, ED: Glucose-Capillary: 124 mg/dL — ABNORMAL HIGH (ref 70–99)

## 2021-03-16 LAB — PHOSPHORUS: Phosphorus: 3.6 mg/dL (ref 2.5–4.6)

## 2021-03-16 MED ORDER — TAMSULOSIN HCL 0.4 MG PO CAPS: 0.4000 mg | ORAL_CAPSULE | Freq: Every day | ORAL | Status: DC

## 2021-03-16 MED ORDER — ACETAMINOPHEN 650 MG RE SUPP: 650.0000 mg | Freq: Four times a day (QID) | RECTAL | Status: DC | PRN

## 2021-03-16 MED ORDER — MAGNESIUM SULFATE IN D5W 1-5 GM/100ML-% IV SOLN
1.0000 g | Freq: Once | INTRAVENOUS | Status: AC
  Administered 2021-03-16: 1 g via INTRAVENOUS
  Filled 2021-03-16: qty 100

## 2021-03-16 MED ORDER — INSULIN ASPART 100 UNIT/ML IJ SOLN
0.0000 [IU] | Freq: Three times a day (TID) | INTRAMUSCULAR | Status: DC
  Administered 2021-03-17 (×2): 2 [IU] via SUBCUTANEOUS
  Administered 2021-03-18 (×2): 3 [IU] via SUBCUTANEOUS

## 2021-03-16 MED ORDER — CARVEDILOL 3.125 MG PO TABS
12.5000 mg | ORAL_TABLET | Freq: Two times a day (BID) | ORAL | Status: DC
  Administered 2021-03-17: 12.5 mg via ORAL
  Filled 2021-03-16: qty 1

## 2021-03-16 MED ORDER — MIRTAZAPINE 15 MG PO TABS
7.5000 mg | ORAL_TABLET | Freq: Every day | ORAL | Status: DC
  Administered 2021-03-16 – 2021-03-22 (×7): 7.5 mg via ORAL
  Filled 2021-03-16 (×8): qty 1

## 2021-03-16 MED ORDER — SODIUM CHLORIDE 0.9% FLUSH
3.0000 mL | Freq: Two times a day (BID) | INTRAVENOUS | Status: DC
  Administered 2021-03-16 – 2021-03-23 (×12): 3 mL via INTRAVENOUS

## 2021-03-16 MED ORDER — ONDANSETRON HCL 4 MG/2ML IJ SOLN: 4.0000 mg | Freq: Four times a day (QID) | INTRAMUSCULAR | Status: DC | PRN

## 2021-03-16 MED ORDER — ONDANSETRON HCL 4 MG PO TABS: 4.0000 mg | ORAL_TABLET | Freq: Four times a day (QID) | ORAL | Status: DC | PRN

## 2021-03-16 MED ORDER — INSULIN GLARGINE-YFGN 100 UNIT/ML ~~LOC~~ SOLN
10.0000 [IU] | Freq: Every day | SUBCUTANEOUS | Status: DC
  Administered 2021-03-17 – 2021-03-23 (×7): 10 [IU] via SUBCUTANEOUS
  Filled 2021-03-16 (×7): qty 0.1

## 2021-03-16 MED ORDER — APIXABAN 5 MG PO TABS
5.0000 mg | ORAL_TABLET | Freq: Two times a day (BID) | ORAL | Status: DC
  Administered 2021-03-16 – 2021-03-23 (×14): 5 mg via ORAL
  Filled 2021-03-16 (×14): qty 1

## 2021-03-16 MED ORDER — ROSUVASTATIN CALCIUM 20 MG PO TABS
20.0000 mg | ORAL_TABLET | Freq: Every day | ORAL | Status: DC
  Administered 2021-03-16 – 2021-03-22 (×7): 20 mg via ORAL
  Filled 2021-03-16 (×7): qty 1

## 2021-03-16 MED ORDER — LEVOTHYROXINE SODIUM 100 MCG PO TABS
100.0000 ug | ORAL_TABLET | Freq: Every day | ORAL | Status: DC
  Administered 2021-03-18 – 2021-03-23 (×6): 100 ug via ORAL
  Filled 2021-03-16 (×6): qty 1

## 2021-03-16 MED ORDER — FERROUS SULFATE 325 (65 FE) MG PO TABS
325.0000 mg | ORAL_TABLET | Freq: Every day | ORAL | Status: DC
  Administered 2021-03-17 – 2021-03-23 (×7): 325 mg via ORAL
  Filled 2021-03-16 (×8): qty 1

## 2021-03-16 MED ORDER — ACETAMINOPHEN 325 MG PO TABS
650.0000 mg | ORAL_TABLET | Freq: Four times a day (QID) | ORAL | Status: DC | PRN
  Administered 2021-03-17 – 2021-03-22 (×5): 650 mg via ORAL
  Filled 2021-03-16 (×4): qty 2

## 2021-03-16 NOTE — ED Notes (Signed)
Pt's wife at bedside.

## 2021-03-16 NOTE — ED Notes (Deleted)
Cardiology to see   Holley Dexter, MD 03/16/21 2113

## 2021-03-16 NOTE — ED Provider Notes (Signed)
[facility] EMERGENCY DEPARTMENT Provider Note   CSN: [id number] Arrival date & time: 03/16/21  1651     History Chief Complaint  Patient presents with   Near Syncope    Thomas [name] is a 87 y.o. male with PMHx HTN, HLD, CAD, DM, prior CVA who presents for evaluation of bradycardia.   Patient states that he was sitting down at his nursing home today when he began to experience an episode of profound weakness.  He felt as if he could not move all of his extremities.  He states that he was wearing a heart monitor at the time, which was alarming and showing a rate of 38.  He states the spell lasted for approximately 1 minute prior to resolving.  He denies experiencing loss of consciousness, numbness, vision changes, chest pain, shortness of breath, or abdominal pain.  EMS was called, the patient was subsequently transported to emergency department for further evaluation.  Of note, patient states that he experienced a similar episode about 1 month ago, for which she was evaluated in the emergency department and subsequently discharged.  He states that he has been in his normal state of health over the past several days.    Past Medical History:  Diagnosis Date   Arthritis    Clotting disorder ([name])    Coronary artery disease    Diabetes ([name])    Hyperlipidemia    Hypertension    Hypothyroidism 07/17/2020   Kidney disease    Loss of hearing    Macular degeneration    Myocardial infarction [facility])    Osteoarthritis    Stroke [facility])     Patient Active Problem List   Diagnosis Date Noted   Near syncope 03/16/2021   Clotting disorder ([name]) 03/16/2021   Orthostatic hypotension 10/01/2020   TIA (transient ischemic attack) 08/31/2020   Normocytic anemia 08/31/2020   Prolonged QT interval 08/31/2020   Acute CVA (cerebrovascular accident) ([name]) 08/31/2020   Debility 08/21/2020   Lytic bone lesion of femur 08/21/2020   Sick sinus syndrome ([name]) 07/31/2020    Coronary artery disease s/p CABG 2001 07/17/2020   Hypothyroidism 07/17/2020   Venous thromboembolism (VTE) 07/17/2020   Hypertension 07/17/2020   Cardiac pacemaker 07/17/2020   Osteoarthritis 07/17/2020   Type 2 diabetes mellitus with hyperglycemia ([name]) 07/17/2020   Vitamin D deficiency 07/17/2020   Macular degeneration 07/17/2020   Dyslipidemia due to type 2 diabetes mellitus ([name]) 07/17/2020   Insomnia 07/17/2020   BPH (benign prostatic hyperplasia) 07/17/2020   Colon polyps 07/17/2020    Past Surgical History:  Procedure Laterality Date   CARPAL TUNNEL RELEASE  2011   CORONARY ANGIOPLASTY WITH STENT PLACEMENT     CORONARY ARTERY BYPASS GRAFT  2001   x6   REPLACEMENT TOTAL KNEE     SPINE SURGERY  2008   cervical and lumbar   THYROIDECTOMY         Family History  Problem Relation Age of Onset   Stroke Mother    Stroke Father    Heart disease Sister    Cancer Daughter    Hypertension Son    Heart disease Brother    Cancer Brother    Heart disease Brother     Social History   Tobacco Use   Smoking status: Never   Smokeless tobacco: Never  Substance Use Topics   Alcohol use: Never   Drug use: Never    Home Medications Prior to Admission medications   Medication Sig Start Date  End Date Taking? Authorizing Provider  acetaminophen (TYLENOL) 325 MG tablet Take 650 mg by mouth every 6 (six) hours as needed for moderate pain or headache.   Yes [provider]  apixaban (ELIQUIS) 5 MG TABS tablet Take 1 tablet (5 mg total) by mouth 2 (two) times daily. 11/27/20  Yes Meriam Sprague, MD  b complex vitamins capsule Take 1 capsule by mouth daily.   Yes [provider]  carvedilol (COREG) 12.5 MG tablet Take 1 tablet (12.5 mg total) by mouth 2 (two) times daily with a meal. 01/08/21  Yes Pemberton, Kathlynn Grate, MD  cholecalciferol (VITAMIN D3) 25 MCG (1000 UNIT) tablet Take 1,000 Units by mouth daily.   Yes [provider]  co-enzyme Q-10 50  MG capsule Take 50 mg by mouth daily.   Yes [provider]  ferrous sulfate 325 (65 FE) MG EC tablet TAKE 1 TABLET(325 MG) BY MOUTH DAILY WITH BREAKFAST Patient taking differently: Take 325 mg by mouth daily with breakfast. 03/13/21  Yes Jaci Standard, MD  glucosamine-chondroitin 500-400 MG tablet Take 1 tablet by mouth 3 (three) times daily.   Yes [provider]  insulin glargine (LANTUS) 100 unit/mL SOPN Inject 15 Units into the skin daily. 10/01/20  Yes Ardith Dark, MD  levothyroxine (SYNTHROID) 100 MCG tablet TAKE 1 TABLET(100 MCG) BY MOUTH DAILY Patient taking differently: Take 100 mcg by mouth daily before breakfast. 02/23/21  Yes Ardith Dark, MD  mirtazapine (REMERON) 7.5 MG tablet Take 1 tablet (7.5 mg total) by mouth at bedtime. 12/09/20  Yes Ardith Dark, MD  Multiple Vitamin (MULTIVITAMIN) tablet Take 1 tablet by mouth daily.   Yes [provider]  Red Yeast Rice Extract (RED YEAST RICE PO) Take 1 capsule by mouth in the morning and at bedtime.   Yes [provider]  rosuvastatin (CRESTOR) 20 MG tablet Take 1 tablet (20 mg total) by mouth at bedtime. 10/20/20  Yes Meriam Sprague, MD  tamsulosin (FLOMAX) 0.4 MG CAPS capsule Take 0.4 mg by mouth daily after supper. 07/11/20  Yes [provider]  Insulin Pen Needle 31G X 5 MM MISC 1 each by Does not apply route daily. Please use a new pen needle daily with Lantus injection 10/03/20   Ardith Dark, MD    Allergies    Amitriptyline, Lyrica [pregabalin], Pravachol [pravastatin], Effexor xr [venlafaxine], Morphine, and Zolpidem  Review of Systems   Review of Systems  Constitutional:  Negative for chills and fever.  HENT:  Negative for ear pain and sore throat.   Eyes:  Negative for pain and visual disturbance.  Respiratory:  Negative for cough and shortness of breath.   Cardiovascular:  Positive for near-syncope. Negative for chest pain and palpitations.  Gastrointestinal:   Negative for abdominal pain and vomiting.  Genitourinary:  Negative for dysuria and hematuria.  Musculoskeletal:  Negative for arthralgias and back pain.  Skin:  Negative for color change and rash.  Neurological:  Positive for weakness. Negative for seizures and syncope.  All other systems reviewed and are negative.  Physical Exam Updated Vital Signs BP (!) 166/65   Pulse (!) 35   Temp 98.3 F (36.8 C) (Oral)   Resp (!) 27   Ht 5\' 8"  (1.727 m)   Wt 73.9 kg   SpO2 97%   BMI 24.78 kg/m   Physical Exam Vitals and nursing note reviewed.  Constitutional:      Appearance: He is well-developed.  HENT:  Head: Normocephalic and atraumatic.  Eyes:     Conjunctiva/sclera: Conjunctivae normal.  Cardiovascular:     Rate and Rhythm: Normal rate and regular rhythm.     Heart sounds: No murmur heard. Pulmonary:     Effort: Pulmonary effort is normal. No respiratory distress.     Breath sounds: Normal breath sounds.  Abdominal:     Palpations: Abdomen is soft.     Tenderness: There is no abdominal tenderness.  Musculoskeletal:     Cervical back: Neck supple.  Skin:    General: Skin is warm and dry.     Capillary Refill: Capillary refill takes less than 2 seconds.  Neurological:     General: No focal deficit present.     Mental Status: He is alert and oriented to person, place, and time. Mental status is at baseline.    ED Results / Procedures / Treatments   Labs (all labs ordered are listed, but only abnormal results are displayed) Labs Reviewed  CBC WITH DIFFERENTIAL/PLATELET - Abnormal; Notable for the following components:      Result Value   RBC 3.59 (*)    Hemoglobin 11.8 (*)    HCT 34.9 (*)    All other components within normal limits  COMPREHENSIVE METABOLIC PANEL - Abnormal; Notable for the following components:   Glucose, Bld 273 (*)    Total Protein 6.2 (*)    Albumin 3.3 (*)    Total Bilirubin 0.2 (*)    GFR, Estimated 60 (*)    All other components within  normal limits  HEMOGLOBIN A1C - Abnormal; Notable for the following components:   Hgb A1c MFr Bld 7.4 (*)    All other components within normal limits  CBG MONITORING, ED - Abnormal; Notable for the following components:   Glucose-Capillary 124 (*)    All other components within normal limits  RESP PANEL BY RT-PCR (FLU A&B, COVID) ARPGX2  MAGNESIUM  PHOSPHORUS  TSH  BASIC METABOLIC PANEL  MAGNESIUM    EKG EKG Interpretation  Date/Time:  Monday March 16 2021 16:56:35 EDT Ventricular Rate:  84 PR Interval:  115 QRS Duration: 90 QT Interval:  397 QTC Calculation: 373 R Axis:   26 Text Interpretation: Failure to sense and/or capture (?magnet) Nonspecific repol abnormality, diffuse leads Confirmed by Blane Ohara [phone]) on 03/16/2021 5:19:02 PM  Radiology DG Chest Portable 1 View  Result Date: 03/16/2021 CLINICAL DATA:  Weakness bradycardia EXAM: PORTABLE CHEST 1 VIEW COMPARISON:  08/19/2020 FINDINGS: Cardiac shadow is enlarged. Postsurgical changes and pacing device are again noted and stable. The lungs are clear bilaterally. No bony abnormality is seen. Old rib fractures are noted on the left stable from the prior study. IMPRESSION: No active disease. Electronically Signed   By: Alcide Clever M.D.   On: 03/16/2021 18:21    Procedures Procedures   Medications Ordered in ED Medications  sodium chloride flush (NS) 0.9 % injection 3 mL (3 mLs Intravenous Given 03/16/21 2204)  acetaminophen (TYLENOL) tablet 650 mg (has no administration in time range)    Or  acetaminophen (TYLENOL) suppository 650 mg (has no administration in time range)  ondansetron (ZOFRAN) tablet 4 mg (has no administration in time range)    Or  ondansetron (ZOFRAN) injection 4 mg (has no administration in time range)  apixaban (ELIQUIS) tablet 5 mg (5 mg Oral Given 03/16/21 2203)  carvedilol (COREG) tablet 12.5 mg (has no administration in time range)  ferrous sulfate tablet 325 mg (has no administration  in time  range)  levothyroxine (SYNTHROID) tablet 100 mcg (has no administration in time range)  rosuvastatin (CRESTOR) tablet 20 mg (20 mg Oral Given 03/16/21 2203)  tamsulosin (FLOMAX) capsule 0.4 mg (has no administration in time range)  mirtazapine (REMERON) tablet 7.5 mg (7.5 mg Oral Given 03/16/21 2212)  insulin aspart (novoLOG) injection 0-15 Units (has no administration in time range)  insulin glargine-yfgn (SEMGLEE) injection 10 Units (has no administration in time range)  magnesium sulfate IVPB 1 g 100 mL (0 g Intravenous Stopped 03/16/21 2311)    ED Course  I have reviewed the triage vital signs and the nursing notes.  Pertinent labs & imaging results that were available during my care of the patient were reviewed by me and considered in my medical decision making (see chart for details).    MDM Rules/Calculators/A&P                           87 y.o. male with past medical history as above who presents for evaluation of an episode of generalized weakness in the setting of reported bradycardia. Afebrile and hemodynamically stable.  Exam as detailed above.  CBC with noncritical anemia.  CMP is unremarkable.  Magnesium and phosphorus within normal limits.  EKG demonstrates bigeminy with's pacer spikes.  No evidence of ischemia.  Chest x-ray with no acute cardiopulmonary abnormality.  [employer] pacemaker was interrogated.  Case was discussed with cardiology, who reviewed the records.  They believe that the heart monitor may have incorrectly measured the patient's rate at one half his actual rate, due to his bigeminy.  Regardless, Cardiology recommending admission for Tele, TTE, PT/OT.  Hospitalist will admit.  Final Clinical Impression(s) / ED Diagnoses Final diagnoses:  None    Rx / DC Orders ED Discharge Orders     None        Holley Dexter, MD 03/18/21 1103    Blane Ohara, MD 03/22/21 2328

## 2021-03-16 NOTE — H&P (Signed)
History and Physical    Thomas [name] XBM:[id number] DOB: 10-26-1928 DOA: 03/16/2021  PCP: Ardith Dark, MD  Patient coming from: SNF  I have personally briefly reviewed patient's old medical records in [facility]  Chief Complaint: weakness / lightheadedness  HPI: Thomas [name] is a 87 y.o. male with medical history significant of SSS s/p PPM, CAD, CABG 2001, DM2, HLD, HTN, orthostatic hypotension, clotting disorder on eliquis, stroke in March.  Pt presents to ED with c/o weakness / near syncope today while working with PT.  Unable to stand up due to feeling too weak.  Heart monitor at home showed pulse in the upper 30s / lower 40s.  EMS called.  Pt brought to ED.   ED Course: BP 151/41.  HR in 70s-80 on monitor, has new onset bigeminy which cards believes is the reason that pulse is running low (reading half of his actual HR).  PPM interrogation = no recent events.  Medicine asked to admit.   Review of Systems: As per HPI, otherwise all review of systems negative.  Past Medical History:  Diagnosis Date   Arthritis    Clotting disorder ([name])    Coronary artery disease    Diabetes ([name])    Hyperlipidemia    Hypertension    Hypothyroidism 07/17/2020   Kidney disease    Loss of hearing    Macular degeneration    Myocardial infarction [facility])    Osteoarthritis    Stroke [facility])     Past Surgical History:  Procedure Laterality Date   CARPAL TUNNEL RELEASE  2011   CORONARY ANGIOPLASTY WITH STENT PLACEMENT     CORONARY ARTERY BYPASS GRAFT  2001   x6   REPLACEMENT TOTAL KNEE     SPINE SURGERY  2008   cervical and lumbar   THYROIDECTOMY       reports that he has never smoked. He has never used smokeless tobacco. He reports that he does not drink alcohol and does not use drugs.  Allergies  Allergen Reactions   Amitriptyline Shortness Of Breath and Palpitations   Lyrica [Pregabalin] Nausea And Vomiting   Pravachol [Pravastatin] Other (See Comments)     Muscle cramps and aches    Effexor Xr [Venlafaxine] Anxiety   Morphine Palpitations   Zolpidem Anxiety and Palpitations    Family History  Problem Relation Age of Onset   Stroke Mother    Stroke Father    Heart disease Sister    Cancer Daughter    Hypertension Son    Heart disease Brother    Cancer Brother    Heart disease Brother      Prior to Admission medications   Medication Sig Start Date End Date Taking? Authorizing Provider  acetaminophen (TYLENOL) 325 MG tablet Take 650 mg by mouth every 6 (six) hours as needed for moderate pain or headache.   Yes [provider]  apixaban (ELIQUIS) 5 MG TABS tablet Take 1 tablet (5 mg total) by mouth 2 (two) times daily. 11/27/20  Yes Meriam Sprague, MD  b complex vitamins capsule Take 1 capsule by mouth daily.   Yes [provider]  carvedilol (COREG) 12.5 MG tablet Take 1 tablet (12.5 mg total) by mouth 2 (two) times daily with a meal. 01/08/21  Yes Pemberton, Kathlynn Grate, MD  cholecalciferol (VITAMIN D3) 25 MCG (1000 UNIT) tablet Take 1,000 Units by mouth daily.   Yes [provider]  co-enzyme Q-10 50 MG capsule Take 50 mg by mouth daily.  Yes [provider]  ferrous sulfate 325 (65 FE) MG EC tablet TAKE 1 TABLET(325 MG) BY MOUTH DAILY WITH BREAKFAST Patient taking differently: Take 325 mg by mouth daily with breakfast. 03/13/21  Yes Jaci Standard, MD  glucosamine-chondroitin 500-400 MG tablet Take 1 tablet by mouth 3 (three) times daily.   Yes [provider]  insulin glargine (LANTUS) 100 unit/mL SOPN Inject 15 Units into the skin daily. 10/01/20  Yes Ardith Dark, MD  levothyroxine (SYNTHROID) 100 MCG tablet TAKE 1 TABLET(100 MCG) BY MOUTH DAILY Patient taking differently: Take 100 mcg by mouth daily before breakfast. 02/23/21  Yes Ardith Dark, MD  mirtazapine (REMERON) 7.5 MG tablet Take 1 tablet (7.5 mg total) by mouth at bedtime. 12/09/20  Yes Ardith Dark, MD  Multiple  Vitamin (MULTIVITAMIN) tablet Take 1 tablet by mouth daily.   Yes [provider]  Red Yeast Rice Extract (RED YEAST RICE PO) Take 1 capsule by mouth in the morning and at bedtime.   Yes [provider]  rosuvastatin (CRESTOR) 20 MG tablet Take 1 tablet (20 mg total) by mouth at bedtime. 10/20/20  Yes Meriam Sprague, MD  tamsulosin (FLOMAX) 0.4 MG CAPS capsule Take 0.4 mg by mouth daily after supper. 07/11/20  Yes [provider]  Insulin Pen Needle 31G X 5 MM MISC 1 each by Does not apply route daily. Please use a new pen needle daily with Lantus injection 10/03/20   Ardith Dark, MD    Physical Exam: Vitals:   03/16/21 1800 03/16/21 1815 03/16/21 1830 03/16/21 1945  BP: (!) 166/52 (!) 157/48 (!) 158/51 (!) 168/61  Pulse:   75 (!) 35  Resp: (!) 24 (!) 22 19 (!) 25  Temp:      TempSrc:      SpO2: 98% 98% 97% 98%  Weight:      Height:        Constitutional: NAD, calm, comfortable Eyes: PERRL, lids and conjunctivae normal ENMT: Mucous membranes are moist. Posterior pharynx clear of any exudate or lesions.Normal dentition. Very hard of hearing. Neck: normal, supple, no masses, no thyromegaly Respiratory: clear to auscultation bilaterally, no wheezing, no crackles. Normal respiratory effort. No accessory muscle use.  Cardiovascular: Regular rate and rhythm, no murmurs / rubs / gallops. No extremity edema. 2+ pedal pulses. No carotid bruits.  Abdomen: no tenderness, no masses palpated. No hepatosplenomegaly. Bowel sounds positive.  Musculoskeletal: no clubbing / cyanosis. No joint deformity upper and lower extremities. Good ROM, no contractures. Normal muscle tone.  Skin: no rashes, lesions, ulcers. No induration Neurologic: CN 2-12 grossly intact. Sensation intact, DTR normal. Strength 5/5 in all 4.  Psychiatric: Normal judgment and insight. Alert and oriented x 3. Normal mood.    Labs on Admission: I have personally reviewed following labs and imaging  studies  CBC: Recent Labs  Lab 03/16/21 1721  WBC 8.9  NEUTROABS 4.8  HGB 11.8*  HCT 34.9*  MCV 97.2  PLT 156   Basic Metabolic Panel: Recent Labs  Lab 03/16/21 1721  NA 138  K 4.0  CL 105  CO2 23  GLUCOSE 273*  BUN 19  CREATININE 1.16  CALCIUM 8.9  MG 1.8  PHOS 3.6   GFR: Estimated Creatinine Clearance: 40.9 mL/min (by C-G formula based on SCr of 1.16 mg/dL). Liver Function Tests: Recent Labs  Lab 03/16/21 1721  AST 29  ALT 22  ALKPHOS 47  BILITOT 0.2*  PROT 6.2*  ALBUMIN 3.3*  No results for input(s): LIPASE, AMYLASE in the last 168 hours. No results for input(s): AMMONIA in the last 168 hours. Coagulation Profile: No results for input(s): INR, PROTIME in the last 168 hours. Cardiac Enzymes: No results for input(s): CKTOTAL, CKMB, CKMBINDEX, TROPONINI in the last 168 hours. BNP (last 3 results) No results for input(s): PROBNP in the last 8760 hours. HbA1C: No results for input(s): HGBA1C in the last 72 hours. CBG: No results for input(s): GLUCAP in the last 168 hours. Lipid Profile: No results for input(s): CHOL, HDL, LDLCALC, TRIG, CHOLHDL, LDLDIRECT in the last 72 hours. Thyroid Function Tests: No results for input(s): TSH, T4TOTAL, FREET4, T3FREE, THYROIDAB in the last 72 hours. Anemia Panel: No results for input(s): VITAMINB12, FOLATE, FERRITIN, TIBC, IRON, RETICCTPCT in the last 72 hours. Urine analysis:    Component Value Date/Time   COLORURINE YELLOW 08/31/2020 0913   APPEARANCEUR CLEAR 08/31/2020 0913   LABSPEC 1.016 08/31/2020 0913   PHURINE 5.0 08/31/2020 0913   GLUCOSEU 50 (A) 08/31/2020 0913   GLUCOSEU 100 (A) 08/22/2020 1122   HGBUR NEGATIVE 08/31/2020 0913   BILIRUBINUR NEGATIVE 08/31/2020 0913   KETONESUR NEGATIVE 08/31/2020 0913   PROTEINUR NEGATIVE 08/31/2020 0913   UROBILINOGEN 0.2 08/22/2020 1122   NITRITE NEGATIVE 08/31/2020 0913   LEUKOCYTESUR NEGATIVE 08/31/2020 0913    Radiological Exams on Admission: DG Chest  Portable 1 View  Result Date: 03/16/2021 CLINICAL DATA:  Weakness bradycardia EXAM: PORTABLE CHEST 1 VIEW COMPARISON:  08/19/2020 FINDINGS: Cardiac shadow is enlarged. Postsurgical changes and pacing device are again noted and stable. The lungs are clear bilaterally. No bony abnormality is seen. Old rib fractures are noted on the left stable from the prior study. IMPRESSION: No active disease. Electronically Signed   By: Alcide Clever M.D.   On: 03/16/2021 18:21    EKG: Independently reviewed.  Assessment/Plan Principal Problem:   Near syncope Active Problems:   Hypertension   Cardiac pacemaker   Type 2 diabetes mellitus with hyperglycemia ([name])   Sick sinus syndrome ([name])   Orthostatic hypotension   Clotting disorder ([name])    Near syncope - with h/o SSS, PPM, orthostatic hypotension, and new onset bigeminy Syncope pathway See cards consult note: 2d Echo Orthostatic vitals Continue coreg at current dose for the moment, though may have to reduce depending on orthostatic vitals. Unable to keep on florinef in past due to HTN Keep K > 4 mag > 2 ordering 1g mag for 1.8 Repeat BMP and Mag in AM Bigeminy will cause manually measured peripheral pulse (and pulse ox HR) to be half his actual HR. Tele monitor H/o PE, clotting disorder - Cont eliquis CAD s/p CABG - Cont eliquis Cont statin DM2 - Lantus 10 daily (takes 15 at home) Add mod scale SSI AC Checking A1C Hypothyroidism - Cont synthroid Check TSH  DVT prophylaxis: Eliquis Code Status: Partial (DNI but do everything else) Family Communication: No family at bedside Disposition Plan: SNF after syncope work up [employer] called: Cards Admission status: Place in obs     Koki Buxton M. DO [facility]  How to contact the [facility] Attending or Consulting provider 7A - 7P or covering provider during after hours 7P -7A, for this patient?  Check the care team in [facility] and look for a) attending/consulting [facility] provider listed  and b) the [facility] team listed Log into [email or web address]  Amion Physician Scheduling and messaging for groups and whole hospitals  On call and physician scheduling software for group practices, residents, hospitalists and  other medical providers for call, clinic, rotation and shift schedules. On[employer] is a hospital-wide system for scheduling doctors and paging doctors on call. EasyPlot is for scientific plotting and data analysis.  [email or web address]  and use [place]'s universal password to access. If you do not have the password, please contact the hospital operator.  Locate the [facility] provider you are looking for under [facility] and page to a number that you can be directly reached. If you still have difficulty reaching the provider, please page the [facility] (Director on Call) for the Hospitalists listed on amion for assistance.  03/16/2021, 9:44 PM

## 2021-03-16 NOTE — Progress Notes (Signed)
    Chronic Care Management Pharmacy Assistant   Name: Thomas [name]  MRN: [id number] DOB: 09/18/1928  Reason for Encounter: CCM Care Plan   Medications: Outpatient Encounter Medications as of 03/16/2021  Medication Sig   acetaminophen (TYLENOL) 325 MG tablet Take 650 mg by mouth every 6 (six) hours as needed for moderate pain or headache.   apixaban (ELIQUIS) 5 MG TABS tablet Take 1 tablet (5 mg total) by mouth 2 (two) times daily.   b complex vitamins capsule Take 1 capsule by mouth daily.   carvedilol (COREG) 12.5 MG tablet Take 1 tablet (12.5 mg total) by mouth 2 (two) times daily with a meal.   cholecalciferol (VITAMIN D3) 25 MCG (1000 UNIT) tablet Take 1,000 Units by mouth daily.   co-enzyme Q-10 50 MG capsule Take 50 mg by mouth daily.   ferrous sulfate 325 (65 FE) MG EC tablet TAKE 1 TABLET(325 MG) BY MOUTH DAILY WITH BREAKFAST   glucosamine-chondroitin 500-400 MG tablet Take 1 tablet by mouth 3 (three) times daily.   insulin glargine (LANTUS) 100 unit/mL SOPN Inject 15 Units into the skin daily.   Insulin Pen Needle 31G X 5 MM MISC 1 each by Does not apply route daily. Please use a new pen needle daily with Lantus injection   levothyroxine (SYNTHROID) 100 MCG tablet TAKE 1 TABLET(100 MCG) BY MOUTH DAILY   mirtazapine (REMERON) 7.5 MG tablet Take 1 tablet (7.5 mg total) by mouth at bedtime.   Multiple Vitamin (MULTIVITAMIN) tablet Take 1 tablet by mouth daily.   Red Yeast Rice Extract (RED YEAST RICE PO) Take 1 capsule by mouth daily.   rosuvastatin (CRESTOR) 20 MG tablet Take 1 tablet (20 mg total) by mouth at bedtime.   tamsulosin (FLOMAX) 0.4 MG CAPS capsule Take 0.4 mg by mouth daily after supper.   No facility-administered encounter medications on file as of 03/16/2021.    Reviewed the patients initial visit reinsured it was completed per the pharmacist Erskine Emery request. Printed the CCM care plan. Mailed the patient CCM care plan to their most recent address on  file.  Follow-Up:Pharmacist Review  Hulen Luster, RMA Clinical Pharmacist Assistant [phone]

## 2021-03-16 NOTE — ED Notes (Signed)
Received verbal report from Clara O and Emily D RN at this time 

## 2021-03-16 NOTE — ED Notes (Signed)
Admit provider at bedside 

## 2021-03-16 NOTE — ED Notes (Deleted)
Admit. Cardiology recommending admission for Tele, TTE, PT/OT   Holley Dexter, MD 03/16/21 2113

## 2021-03-16 NOTE — ED Notes (Signed)
Pacemaker interrogated. 

## 2021-03-16 NOTE — Consult Note (Addendum)
Cardiology Consultation:   Patient ID: Thomas [name] MRN: [id number]; DOB: [birth year]  Admit date: 03/16/2021 Date of Consult: 03/16/2021  PCP:  Ardith Dark, MD   [facility] Providers Cardiologist:  None        Patient Profile:   Christphor Groft is a 87 y.o. male with a hx of CAD status post CABG in 2001, sick sinus syndrome status post PPM, pulmonary embolism, CVA, hypertension, T2DM, hyperlipidemia, orthostatic hypotension, hypothyroidism who is being seen 03/16/2021 for the evaluation of weakness/bradycardia at the request of Dr Madilyn Fireman.  History of Present Illness:   Mr. Erazo is a 87 y.o. male with a hx of CAD status post CABG in 2001, sick sinus syndrome status post PPM, pulmonary embolism, CVA, hypertension, T2DM, hyperlipidemia, orthostatic hypotension, hypothyroidism who is being seen for the evaluation of near syncope.  He was admitted with a stroke 08/31/2020.  Thought to be secondary to diffuse severe intracranial stenosis in setting of low BP/orthostatic hypotension.  Most recent echocardiogram 08/07/2020 showed EF 60 to 65%, moderate basal septal LVH, mildly reduced RV function, mild aortic dilatation.  Most recent device interrogation on 01/29/2021 showed no abnormalities, 96% atrial paced, 0.1% V paced.  Last seen by Dr. Shari Prows 01/08/2021.  Had been having issues with orthostatic hypotension and was on Florinef.  However was also on carvedilol.  Both medications were stopped, but BP running high, Coreg was restarted.  He presents to the ED with weakness/near syncope.  Reports he was working with physical therapy today and felt weakness.  States that he was unable to stand up because he felt so weak.  EMS was called and reported pulse was in upper 30s/low 40s on arrival.  Glucose was normal.  On arrival to ED, initial vital signs notable for BP 151/41, pulse 73, SPO2 98% on room air.  Labs notable for creatinine 1.16, potassium 4.0, magnesium 1.8, hemoglobin 11.8, WBC 8.9.   EKG shows atrial paced rhythm with ventricular bigeminy.  Telemetry shows atrial paced rhythm with ventricular bigeminy.  Device interrogation shows no recent events.  He currently denies any chest pain, dyspnea, lightheadedness or syncope.   Past Medical History:  Diagnosis Date   Arthritis    Clotting disorder ([name])    Coronary artery disease    Diabetes ([name])    Hyperlipidemia    Hypertension    Hypothyroidism 07/17/2020   Kidney disease    Loss of hearing    Macular degeneration    Myocardial infarction [facility])    Osteoarthritis    Stroke [facility])     Past Surgical History:  Procedure Laterality Date   CARPAL TUNNEL RELEASE  2011   CORONARY ANGIOPLASTY WITH STENT PLACEMENT     CORONARY ARTERY BYPASS GRAFT  2001   x6   REPLACEMENT TOTAL KNEE     SPINE SURGERY  2008   cervical and lumbar   THYROIDECTOMY       Inpatient Medications: Scheduled Meds:  Continuous Infusions:  PRN Meds:   Allergies:    Allergies  Allergen Reactions   Amitriptyline    Effexor Xr [Venlafaxine]    Lyrica [Pregabalin]    Morphine    Morphine And Related    Pravachol [Pravastatin]    Zolpidem     Social History:   Social History   Socioeconomic History   Marital status: Married    Spouse name: Not on file   Number of children: Not on file   Years of education: Not on file   Highest education  level: Not on file  Occupational History   Not on file  Tobacco Use   Smoking status: Never   Smokeless tobacco: Never  Substance and Sexual Activity   Alcohol use: Never   Drug use: Never   Sexual activity: Not on file  Other Topics Concern   Not on file  Social History Narrative   Not on file   Social Determinants of Health   Financial Resource Strain: Low Risk    Difficulty of Paying Living Expenses: Not very hard  Food Insecurity: Not on file  Transportation Needs: Not on file  Physical Activity: Not on file  Stress: Not on file  Social Connections: Not on file  Intimate  Partner Violence: Not on file    Family History:    Family History  Problem Relation Age of Onset   Stroke Mother    Stroke Father    Heart disease Sister    Cancer Daughter    Hypertension Son    Heart disease Brother    Cancer Brother    Heart disease Brother      ROS:  Please see the history of present illness.   All other ROS reviewed and negative.     Physical Exam/Data:   Vitals:   03/16/21 1800 03/16/21 1815 03/16/21 1830 03/16/21 1945  BP: (!) 166/52 (!) 157/48 (!) 158/51 (!) 168/61  Pulse:   75 (!) 35  Resp: (!) 24 (!) 22 19 (!) 25  Temp:      TempSrc:      SpO2: 98% 98% 97% 98%  Weight:      Height:       No intake or output data in the 24 hours ending 03/16/21 2015 Last 3 Weights 03/16/2021 02/24/2021 01/08/2021  Weight (lbs) 163 lb 164 lb 161 lb 6.4 oz  Weight (kg) 73.936 kg 74.39 kg 73.211 kg     Body mass index is 24.78 kg/m.  General:  in no acute distress HEENT: normal Neck: no JVD Cardiac: Irregular, normal rate, no murmur  Lungs:  clear to auscultation bilaterally, no wheezing, rhonchi or rales  Abd: soft, nontender Ext: no edema Musculoskeletal:  No deformities, BUE and BLE strength normal and equal Skin: warm and dry  Neuro:  no focal abnormalities noted Psych:  Normal affect   EKG:  The EKG was personally reviewed and demonstrates:  atrial paced rhythm with ventricular bigeminy.   Telemetry:  Telemetry was personally reviewed and demonstrates:  atrial paced rhythm with ventricular bigeminy.  Relevant CV Studies:   Laboratory Data:  High Sensitivity Troponin:  No results for input(s): TROPONINIHS in the last 720 hours.   Chemistry Recent Labs  Lab 03/16/21 1721  NA 138  K 4.0  CL 105  CO2 23  GLUCOSE 273*  BUN 19  CREATININE 1.16  CALCIUM 8.9  MG 1.8  GFRNONAA 60*  ANIONGAP 10    Recent Labs  Lab 03/16/21 1721  PROT PENDING  ALBUMIN 3.3*  AST 29  ALT 22  ALKPHOS 47  BILITOT 0.2*   Lipids No results for input(s):  CHOL, TRIG, HDL, LABVLDL, LDLCALC, CHOLHDL in the last 168 hours.  Hematology Recent Labs  Lab 03/16/21 1721  WBC 8.9  RBC 3.59*  HGB 11.8*  HCT 34.9*  MCV 97.2  MCH 32.9  MCHC 33.8  RDW 13.7  PLT 156   Thyroid No results for input(s): TSH, FREET4 in the last 168 hours.  BNPNo results for input(s): BNP, PROBNP in the last 168  hours.  DDimer No results for input(s): DDIMER in the last 168 hours.   Radiology/Studies:  DG Chest Portable 1 View  Result Date: 03/16/2021 CLINICAL DATA:  Weakness bradycardia EXAM: PORTABLE CHEST 1 VIEW COMPARISON:  08/19/2020 FINDINGS: Cardiac shadow is enlarged. Postsurgical changes and pacing device are again noted and stable. The lungs are clear bilaterally. No bony abnormality is seen. Old rib fractures are noted on the left stable from the prior study. IMPRESSION: No active disease. Electronically Signed   By: Alcide Clever M.D.   On: 03/16/2021 18:21     Assessment and Plan:    Weakness/reported bradycardia: Occurred after working with PT today, was unable to stand due to weakness.  Was reported to have pulse in high 30s/low 40s on EMS arrival and there was concern that PPM was malfunctioning.  However on review, he is in an atrial paced rhythm with ventricular bigeminy with rate 70-80s, so measuring pulse manually will be half the rate on his EKG and is likely why his pulse was reported low by EMS.  His device appears to functioning appropriately.  No events on device interrogation.  Ventricular bigeminy: Will check echocardiogram.  Maintain K>4, Mag>2.  Continue carvedilol.  CAD: s/p CABG in 2001.  No anginal symptoms.  Continue Eliquis, rosuvastatin  Orthostatic hypotension: Has been a chronic issue, previously was on Florinef but discontinued due to hypertension.  May have contributed to weakness today.  Will check orthostatics.  May have to back off carvedilol  SSS s/p PPM placement: [employer] PPM functioning well.    CVA: admitted with a  stroke 08/31/2020.  Thought to be secondary to diffuse severe intracranial stenosis in setting of low BP/orthostatic hypotension.  Check orthostatics as above  PE: On Eliquis  For questions or updates, please contact [facility] Please consult [email or web address] for contact info under    Signed, Little Ishikawa, MD  03/16/2021 8:15 PM

## 2021-03-16 NOTE — ED Notes (Signed)
Pt stated during standing orthostatics he started to feel dizzy

## 2021-03-16 NOTE — ED Triage Notes (Signed)
Pt BIB by EMS w/ a near syncopal episode. Pt also reporting generalized weakness x 3 weeks. Concerns of whether or not his pacemaker is working correctly. Pt has hx of pacemaker not working correctly.   VSS w/ EMS other than HR. Pt bigeminy on monitor w/ palpable pulses in the 30's. EMS reports some threadiness of pulses at times.

## 2021-03-17 ENCOUNTER — Encounter (HOSPITAL_COMMUNITY): Payer: Self-pay | Admitting: Internal Medicine

## 2021-03-17 ENCOUNTER — Observation Stay (HOSPITAL_COMMUNITY): Payer: Medicare Other

## 2021-03-17 DIAGNOSIS — Z79899 Other long term (current) drug therapy: Secondary | ICD-10-CM | POA: Diagnosis not present

## 2021-03-17 DIAGNOSIS — I951 Orthostatic hypotension: Secondary | ICD-10-CM | POA: Diagnosis present

## 2021-03-17 DIAGNOSIS — I493 Ventricular premature depolarization: Secondary | ICD-10-CM | POA: Diagnosis not present

## 2021-03-17 DIAGNOSIS — Z79890 Hormone replacement therapy (postmenopausal): Secondary | ICD-10-CM | POA: Diagnosis not present

## 2021-03-17 DIAGNOSIS — I498 Other specified cardiac arrhythmias: Secondary | ICD-10-CM | POA: Diagnosis not present

## 2021-03-17 DIAGNOSIS — I459 Conduction disorder, unspecified: Secondary | ICD-10-CM | POA: Diagnosis not present

## 2021-03-17 DIAGNOSIS — I2510 Atherosclerotic heart disease of native coronary artery without angina pectoris: Secondary | ICD-10-CM | POA: Diagnosis present

## 2021-03-17 DIAGNOSIS — I495 Sick sinus syndrome: Secondary | ICD-10-CM | POA: Diagnosis present

## 2021-03-17 DIAGNOSIS — R55 Syncope and collapse: Secondary | ICD-10-CM

## 2021-03-17 DIAGNOSIS — I2782 Chronic pulmonary embolism: Secondary | ICD-10-CM | POA: Diagnosis not present

## 2021-03-17 DIAGNOSIS — Z794 Long term (current) use of insulin: Secondary | ICD-10-CM | POA: Diagnosis not present

## 2021-03-17 DIAGNOSIS — E890 Postprocedural hypothyroidism: Secondary | ICD-10-CM | POA: Diagnosis present

## 2021-03-17 DIAGNOSIS — Z950 Presence of cardiac pacemaker: Secondary | ICD-10-CM

## 2021-03-17 DIAGNOSIS — E1165 Type 2 diabetes mellitus with hyperglycemia: Secondary | ICD-10-CM | POA: Diagnosis present

## 2021-03-17 DIAGNOSIS — D689 Coagulation defect, unspecified: Secondary | ICD-10-CM | POA: Diagnosis not present

## 2021-03-17 DIAGNOSIS — H9190 Unspecified hearing loss, unspecified ear: Secondary | ICD-10-CM | POA: Diagnosis present

## 2021-03-17 DIAGNOSIS — E785 Hyperlipidemia, unspecified: Secondary | ICD-10-CM | POA: Diagnosis present

## 2021-03-17 DIAGNOSIS — Z951 Presence of aortocoronary bypass graft: Secondary | ICD-10-CM | POA: Diagnosis not present

## 2021-03-17 DIAGNOSIS — Z86711 Personal history of pulmonary embolism: Secondary | ICD-10-CM | POA: Diagnosis not present

## 2021-03-17 DIAGNOSIS — Z8249 Family history of ischemic heart disease and other diseases of the circulatory system: Secondary | ICD-10-CM | POA: Diagnosis not present

## 2021-03-17 DIAGNOSIS — D696 Thrombocytopenia, unspecified: Secondary | ICD-10-CM | POA: Diagnosis present

## 2021-03-17 DIAGNOSIS — I252 Old myocardial infarction: Secondary | ICD-10-CM | POA: Diagnosis not present

## 2021-03-17 DIAGNOSIS — I10 Essential (primary) hypertension: Secondary | ICD-10-CM | POA: Diagnosis present

## 2021-03-17 DIAGNOSIS — Z20822 Contact with and (suspected) exposure to covid-19: Secondary | ICD-10-CM | POA: Diagnosis present

## 2021-03-17 DIAGNOSIS — D6859 Other primary thrombophilia: Secondary | ICD-10-CM | POA: Diagnosis present

## 2021-03-17 DIAGNOSIS — M1712 Unilateral primary osteoarthritis, left knee: Secondary | ICD-10-CM | POA: Diagnosis present

## 2021-03-17 DIAGNOSIS — Z8673 Personal history of transient ischemic attack (TIA), and cerebral infarction without residual deficits: Secondary | ICD-10-CM | POA: Diagnosis not present

## 2021-03-17 DIAGNOSIS — D649 Anemia, unspecified: Secondary | ICD-10-CM | POA: Diagnosis present

## 2021-03-17 DIAGNOSIS — Z823 Family history of stroke: Secondary | ICD-10-CM | POA: Diagnosis not present

## 2021-03-17 DIAGNOSIS — Z7901 Long term (current) use of anticoagulants: Secondary | ICD-10-CM | POA: Diagnosis not present

## 2021-03-17 LAB — BASIC METABOLIC PANEL
Anion gap: 10 (ref 5–15)
BUN: 18 mg/dL (ref 8–23)
CO2: 23 mmol/L (ref 22–32)
Calcium: 8.8 mg/dL — ABNORMAL LOW (ref 8.9–10.3)
Chloride: 105 mmol/L (ref 98–111)
Creatinine, Ser: 1.04 mg/dL (ref 0.61–1.24)
GFR, Estimated: >60 mL/min (ref >60)
Glucose, Bld: 259 mg/dL — ABNORMAL HIGH (ref 70–99)
Potassium: 3.5 mmol/L (ref 3.5–5.1)
Sodium: 138 mmol/L (ref 135–145)

## 2021-03-17 LAB — ECHOCARDIOGRAM COMPLETE
Height: 68 in
S' Lateral: 3.1 cm
Single Plane A4C EF: 35.8 %
Weight: 2608 oz

## 2021-03-17 LAB — CBG MONITORING, ED
Glucose-Capillary: 169 mg/dL — ABNORMAL HIGH (ref 70–99)
Glucose-Capillary: 124 mg/dL — ABNORMAL HIGH (ref 70–99)
Glucose-Capillary: 129 mg/dL — ABNORMAL HIGH (ref 70–99)

## 2021-03-17 LAB — RESP PANEL BY RT-PCR (FLU A&B, COVID) ARPGX2
Influenza A by PCR: NEGATIVE
Influenza B by PCR: NEGATIVE
SARS Coronavirus 2 by RT PCR: NEGATIVE

## 2021-03-17 LAB — GLUCOSE, CAPILLARY: Glucose-Capillary: 123 mg/dL — ABNORMAL HIGH (ref 70–99)

## 2021-03-17 LAB — MAGNESIUM: Magnesium: 1.9 mg/dL (ref 1.7–2.4)

## 2021-03-17 MED ORDER — SODIUM CHLORIDE 0.9 % IV SOLN: INTRAVENOUS | Status: AC

## 2021-03-17 MED ORDER — CARVEDILOL 6.25 MG PO TABS
6.2500 mg | ORAL_TABLET | Freq: Two times a day (BID) | ORAL | Status: DC
  Administered 2021-03-17 – 2021-03-20 (×6): 6.25 mg via ORAL
  Filled 2021-03-17 (×3): qty 1
  Filled 2021-03-17: qty 2
  Filled 2021-03-17 (×2): qty 1

## 2021-03-17 MED ORDER — LOSARTAN POTASSIUM 25 MG PO TABS
25.0000 mg | ORAL_TABLET | Freq: Every day | ORAL | Status: DC
  Administered 2021-03-17 – 2021-03-20 (×4): 25 mg via ORAL
  Filled 2021-03-17 (×4): qty 1

## 2021-03-17 NOTE — Progress Notes (Signed)
  Echocardiogram 2D Echocardiogram has been performed.  Thomas [name] 03/17/2021, 4:30 PM

## 2021-03-17 NOTE — Evaluation (Signed)
Physical Therapy Evaluation Patient Details Name: Thomas [name] MRN: [id number] DOB: 29-Oct-1928 Today's Date: 03/17/2021  History of Present Illness  Pt is a 87 y/o male admitted 9/19 secondary to near syncope. Found to have ventricular bigeminy as well. PMH includes HTN, s/p pacemaker, DM, CAD s/p CABG, and CVA.  Clinical Impression  Pt admitted secondary to problem above with deficits below. Pt standing in room with RN upon entry to take orthostatic vitals. Per RN required minimal A to stand. Pt's BP dropped to 91/62 and required mod A for controlled descent into sitting. Pt also requiring mod A to maintain sitting balance and to return to supine. Was able to perform supine HEP and BP returned to 129/90. Anticipate pt will progress well once BP improved and will be able to d/c home with family support and resumption of HHPT. However, if pt does not progress well, may need to consider ST SNF. Will continue to follow acutely.     03/17/21 1014  Orthostatic Lying   BP- Lying 170/80  Pulse- Lying 71  Orthostatic Sitting  BP- Sitting 133/81  Pulse- Sitting 71  Orthostatic Standing at 0 minutes  BP- Standing at 0 minutes 91/62  Pulse- Standing at 0 minutes 63         Recommendations for follow up therapy are one component of a multi-disciplinary discharge planning process, led by the attending physician.  Recommendations may be updated based on patient status, additional functional criteria and insurance authorization.  Follow Up Recommendations Home health PT;Supervision for mobility/OOB (pending progression)    Equipment Recommendations  3in1 (PT)    Recommendations for Other Services       Precautions / Restrictions Precautions Precautions: Fall;Other (comment) Precaution Comments: watch BP Required Braces or Orthoses: Other Brace Other Brace: Wears soft knee brace on L knee at baseline Restrictions Weight Bearing Restrictions: No      Mobility  Bed Mobility Overal bed  mobility: Needs Assistance Bed Mobility: Sit to Supine       Sit to supine: Mod assist   General bed mobility comments: Mod A for trunk and LE assist to return to supine. Required mod A to maintain sitting balance upon sitting down following lower BP.    Transfers Overall transfer level: Needs assistance               General transfer comment: Mod A to control descent back to bed. Was standing with RN initially upon entry who reports he required minimal assist to stand initially. Pt's BP dropping to 91/62 in standing so further mobility deferred.  Ambulation/Gait                Stairs            Wheelchair Mobility    Modified Rankin (Stroke Patients Only)       Balance Overall balance assessment: Needs assistance Sitting-balance support: No upper extremity supported;Feet supported Sitting balance-Leahy Scale: Poor Sitting balance - Comments: Reliant on mod A to maintain sitting balance upon return to sitting.   Standing balance support: Bilateral upper extremity supported Standing balance-Leahy Scale: Poor Standing balance comment: Reliant on UE support                             Pertinent Vitals/Pain Pain Assessment: No/denies pain    Home Living Family/patient expects to be discharged to:: Private residence Living Arrangements: Spouse/significant other Available Help at Discharge: Family;Available 24 hours/day Type of Home: House  Home Access: Level entry     Home Layout: One level Home Equipment: Walker - 2 wheels;Shower seat;Transport chair      Prior Function Level of Independence: Needs assistance   Gait / Transfers Assistance Needed: Normally able to use RW for household distances. Uses transport chair for longer distances.  ADL's / Homemaking Assistance Needed: Reporting independence with ADLs.        Hand Dominance        Extremity/Trunk Assessment   Upper Extremity Assessment Upper Extremity Assessment: Defer  to OT evaluation    Lower Extremity Assessment Lower Extremity Assessment: Generalized weakness    Cervical / Trunk Assessment Cervical / Trunk Assessment: Kyphotic  Communication   Communication: HOH  Cognition Arousal/Alertness: Awake/alert Behavior During Therapy: WFL for tasks assessed/performed Overall Cognitive Status: Within Functional Limits for tasks assessed                                        General Comments General comments (skin integrity, edema, etc.): Pt's wife present    Exercises General Exercises - Lower Extremity Ankle Circles/Pumps: AROM;Both;20 reps Heel Slides: AROM;Both;10 reps;Supine Hip ABduction/ADduction: AROM;Both;10 reps;Supine   Assessment/Plan    PT Assessment Patient needs continued PT services  PT Problem List Decreased strength;Decreased activity tolerance;Decreased balance;Decreased mobility;Decreased knowledge of use of DME;Decreased knowledge of precautions       PT Treatment Interventions DME instruction;Gait training;Functional mobility training;Therapeutic activities;Therapeutic exercise;Balance training;Patient/family education    PT Goals (Current goals can be found in the Care Plan section)  Acute Rehab PT Goals Patient Stated Goal: to return home PT Goal Formulation: With patient/family Time For Goal Achievement: 03/31/21 Potential to Achieve Goals: Good    Frequency Min 3X/week   Barriers to discharge        Co-evaluation               AM-PAC PT "6 Clicks" Mobility  Outcome Measure Help needed turning from your back to your side while in a flat bed without using bedrails?: A Little Help needed moving from lying on your back to sitting on the side of a flat bed without using bedrails?: A Little Help needed moving to and from a bed to a chair (including a wheelchair)?: A Lot Help needed standing up from a chair using your arms (e.g., wheelchair or bedside chair)?: A Lot Help needed to walk in  hospital room?: A Lot Help needed climbing 3-5 steps with a railing? : A Lot 6 Click Score: 14    End of Session Equipment Utilized During Treatment: Gait belt Activity Tolerance: Treatment limited secondary to medical complications (Comment) (+ orthostatics) Patient left: in bed;with call bell/phone within reach (on stretcher in ED) Nurse Communication: Mobility status;Other (comment) (+orthostatics) PT Visit Diagnosis: Unsteadiness on feet (R26.81);Muscle weakness (generalized) (M62.81);Difficulty in walking, not elsewhere classified (R26.2)    Time: 7253-6644 PT Time Calculation (min) (ACUTE ONLY): 19 min   Charges:   PT Evaluation $PT Eval Moderate Complexity: 1 Mod          Farley Ly, PT, DPT  Acute Rehabilitation Services  Pager: [phone] Office: [phone]   Lehman Prom 03/17/2021, 10:48 AM

## 2021-03-17 NOTE — Progress Notes (Signed)
DAILY PROGRESS NOTE   [name] Date of Encounter: 03/17/2021 Cardiologist: None  Chief Complaint   Wants to go home  Patient Profile   Thomas [name] is a 87 y.o. male with a hx of CAD status post CABG in 2001, sick sinus syndrome status post PPM, pulmonary embolism, CVA, hypertension, T2DM, hyperlipidemia, orthostatic hypotension, hypothyroidism who is being seen 03/16/2021 for the evaluation of weakness/bradycardia at the request of Dr Madilyn Fireman.  Subjective   [name] says he feels better today - wants to go home. Telemetry reviewed, his bigeminy has resolved. HR in 70's, sinus rhythm.  BP remains elevated.  Objective   Vitals:   03/17/21 0700 03/17/21 0726 03/17/21 0730 03/17/21 0800  BP: (!) 200/91 (!) 200/91 (!) 176/149 (!) 151/63  Pulse: 69 73 71 70  Resp: (!) 22  17 20   Temp:      TempSrc:      SpO2: 97%  98% 96%  Weight:      Height:        Intake/Output Summary (Last 24 hours) at 03/17/2021 03/19/2021 Last data filed at 03/17/2021 03/19/2021 Gross per 24 hour  Intake 92.69 ml  Output 750 ml  Net -657.31 ml   Filed Weights   03/16/21 1701  Weight: 73.9 kg    Physical Exam   General appearance: alert, appears stated age, and no distress Neck: no carotid bruit, no JVD, and thyroid not enlarged, symmetric, no tenderness/mass/nodules Lungs: clear to auscultation bilaterally Heart: regular rate and rhythm, S1, S2 normal, and pacemaker noted Abdomen: soft, non-tender; bowel sounds normal; no masses,  no organomegaly and scaphoid Extremities: extremities normal, atraumatic, no cyanosis or edema Pulses: 2+ and symmetric Skin: Skin color, texture, turgor normal. No rashes or lesions Neurologic: Grossly normal Psych: Pleasant  Inpatient Medications    Scheduled Meds:  apixaban  5 mg Oral BID   carvedilol  12.5 mg Oral BID WC   ferrous sulfate  325 mg Oral Q breakfast   insulin aspart  0-15 Units Subcutaneous TID WC   insulin glargine-yfgn  10  Units Subcutaneous Daily   levothyroxine  100 mcg Oral QAC breakfast   mirtazapine  7.5 mg Oral QHS   rosuvastatin  20 mg Oral QHS   sodium chloride flush  3 mL Intravenous Q12H   tamsulosin  0.4 mg Oral QPC supper    Continuous Infusions:   PRN Meds: acetaminophen **OR** acetaminophen, ondansetron **OR** ondansetron (ZOFRAN) IV   Labs   Results for orders placed or performed during the hospital encounter of 03/16/21 (from the past 48 hour(s))  CBC with Differential     Status: Abnormal   Collection Time: 03/16/21  5:21 PM  Result Value Ref Range   WBC 8.9 4.0 - 10.5 K/uL   RBC 3.59 (L) 4.22 - 5.81 MIL/uL   Hemoglobin 11.8 (L) 13.0 - 17.0 g/dL   HCT 03/18/21 (L) 06.2 - 69.4 %   MCV 97.2 80.0 - 100.0 fL   MCH 32.9 26.0 - 34.0 pg   MCHC 33.8 30.0 - 36.0 g/dL   RDW 85.4 62.7 - 03.5 %   Platelets 156 150 - 400 K/uL   nRBC 0.0 0.0 - 0.2 %   Neutrophils Relative % 54 %   Neutro Abs 4.8 1.7 - 7.7 K/uL   Lymphocytes Relative 36 %   Lymphs Abs 3.2 0.7 - 4.0 K/uL   Monocytes Relative 8 %   Monocytes Absolute 0.7 0.1 - 1.0 K/uL   Eosinophils Relative  2 %   Eosinophils Absolute 0.2 0.0 - 0.5 K/uL   Basophils Relative 0 %   Basophils Absolute 0.0 0.0 - 0.1 K/uL   Immature Granulocytes 0 %   Abs Immature Granulocytes 0.03 0.00 - 0.07 K/uL    Comment: Performed at [facility], [address] [place], [place] [address]  Comprehensive metabolic panel     Status: Abnormal   Collection Time: 03/16/21  5:21 PM  Result Value Ref Range   Sodium 138 135 - 145 mmol/L   Potassium 4.0 3.5 - 5.1 mmol/L    Comment: SLIGHT HEMOLYSIS   Chloride 105 98 - 111 mmol/L   CO2 23 22 - 32 mmol/L   Glucose, Bld 273 (H) 70 - 99 mg/dL    Comment: Glucose reference range applies only to samples taken after fasting for at least 8 hours.   BUN 19 8 - 23 mg/dL   Creatinine, Ser 2.37 0.61 - 1.24 mg/dL   Calcium 8.9 8.9 - 62.8 mg/dL   Total Protein 6.2 (L) 6.5 - 8.1 g/dL   Albumin 3.3 (L) 3.5 - 5.0  g/dL   AST 29 15 - 41 U/L   ALT 22 0 - 44 U/L   Alkaline Phosphatase 47 38 - 126 U/L   Total Bilirubin 0.2 (L) 0.3 - 1.2 mg/dL   GFR, Estimated 60 (L) >60 mL/min    Comment: (NOTE) Calculated using the CKD-EPI Creatinine Equation (2021)    Anion gap 10 5 - 15    Comment: Performed at [facility], [address], [place], [place] [address]  Magnesium     Status: None   Collection Time: 03/16/21  5:21 PM  Result Value Ref Range   Magnesium 1.8 1.7 - 2.4 mg/dL    Comment: Performed at [facility], [address] [place], [place] [address]  Phosphorus     Status: None   Collection Time: 03/16/21  5:21 PM  Result Value Ref Range   Phosphorus 3.6 2.5 - 4.6 mg/dL    Comment: Performed at [facility], [address], [place], [place] [address]  Hemoglobin A1c     Status: Abnormal   Collection Time: 03/16/21  9:35 PM  Result Value Ref Range   Hgb A1c MFr Bld 7.4 (H) 4.8 - 5.6 %    Comment: (NOTE) Pre diabetes:          5.7%-6.4%  Diabetes:              >6.4%  Glycemic control for   <7.0% adults with diabetes    Mean Plasma Glucose 165.68 mg/dL    Comment: Performed at [facility], [address] [place], [place] [address]  TSH     Status: None   Collection Time: 03/16/21  9:55 PM  Result Value Ref Range   TSH 3.529 0.350 - 4.500 uIU/mL    Comment: Performed by a 3rd Generation assay with a functional sensitivity of <=0.01 uIU/mL. Performed at [facility], [address] [place], [place] [address]   CBG monitoring, ED     Status: Abnormal   Collection Time: 03/16/21 10:07 PM  Result Value Ref Range   Glucose-Capillary 124 (H) 70 - 99 mg/dL    Comment: Glucose reference range applies only to samples taken after fasting for at least 8 hours.  Basic metabolic panel     Status: Abnormal   Collection Time: 03/17/21  3:26 AM  Result Value  Ref Range   Sodium 138 135 - 145 mmol/L   Potassium 3.5 3.5 - 5.1 mmol/L   Chloride 105 98 - 111 mmol/L    CO2 23 22 - 32 mmol/L   Glucose, Bld 259 (H) 70 - 99 mg/dL    Comment: Glucose reference range applies only to samples taken after fasting for at least 8 hours.   BUN 18 8 - 23 mg/dL   Creatinine, Ser 5.42 0.61 - 1.24 mg/dL   Calcium 8.8 (L) 8.9 - 10.3 mg/dL   GFR, Estimated >70 >62 mL/min    Comment: (NOTE) Calculated using the CKD-EPI Creatinine Equation (2021)    Anion gap 10 5 - 15    Comment: Performed at [facility], [address] [place], [place] [address]  Magnesium     Status: None   Collection Time: 03/17/21  3:26 AM  Result Value Ref Range   Magnesium 1.9 1.7 - 2.4 mg/dL    Comment: Performed at [facility], [address] [place], [place] [address]  CBG monitoring, ED     Status: Abnormal   Collection Time: 03/17/21  7:29 AM  Result Value Ref Range   Glucose-Capillary 169 (H) 70 - 99 mg/dL    Comment: Glucose reference range applies only to samples taken after fasting for at least 8 hours.    ECG   N/A  Telemetry   Sinus rhythm - Personally Reviewed  Radiology    DG Chest Portable 1 View  Result Date: 03/16/2021 CLINICAL DATA:  Weakness bradycardia EXAM: PORTABLE CHEST 1 VIEW COMPARISON:  08/19/2020 FINDINGS: Cardiac shadow is enlarged. Postsurgical changes and pacing device are again noted and stable. The lungs are clear bilaterally. No bony abnormality is seen. Old rib fractures are noted on the left stable from the prior study. IMPRESSION: No active disease. Electronically Signed   By: Alcide Clever M.D.   On: 03/16/2021 18:21    Cardiac Studies   None  Assessment   Principal Problem:   Near syncope Active Problems:   Hypertension   Cardiac pacemaker   Type 2 diabetes mellitus with hyperglycemia ([name])   Sick sinus syndrome ([name])   Orthostatic hypotension   Clotting disorder ([name])   Plan   He was noted to have ventricular bigeminy which has abated. HR now in the 70's - bp is elevated.  Agree with plan to decrease dose of  carvedilol to 6.25 mg twice daily.  Will add low-dose losartan 25 mg daily due to uncontrolled hypertension.  If possible, consider DC tamsulosin as this may contribute to orthostasis.  No further cardiac recommendations at this time.  May be able to discharge later today from a cardiac standpoint.   Time Spent Directly with Patient:  I have spent a total of 25 minutes with the patient reviewing hospital notes, telemetry, EKGs, labs and examining the patient as well as establishing an assessment and plan that was discussed personally with the patient.  > 50% of time was spent in direct patient care.  Length of Stay:  LOS: 0 days   Chrystie Nose, MD, [facility], FACP  [place]  [facility]  Medical Director of the Advanced Lipid Disorders &  Cardiovascular Risk Reduction Clinic Diplomate of the American Board of Clinical Lipidology Attending Cardiologist  Direct Dial: [phone]  Fax: [phone]  Website:  [email or web address] Nicely Casady Voshell 03/17/2021, 9:17 AM

## 2021-03-17 NOTE — Progress Notes (Signed)
Patient transferred to floor from ED. Upon arrival, patient noticed his belongings did not transfer with him to the floor. Secure chat sent to ED nurse Grant Fontana about the patients belongings. Santina Evans states she was able to locate the belongings and will bring them up to the patients room.

## 2021-03-17 NOTE — Progress Notes (Signed)
PROGRESS NOTE    Thomas [name]  TGG:[id number] DOB: [birth year] DOA: 03/16/2021 PCP: Ardith Dark, MD   Brief Narrative:  Thomas [name] is a 87 y.o. male with medical history significant of SSS s/p PPM, CAD, CABG 2001, DM2, HLD, HTN, orthostatic hypotension, clotting disorder on eliquis, stroke in March   Presentedto ED with c/o weakness / near syncope today while working with PT.  Unable to stand up due to feeling too weak.  Heart monitor at home showed pulse in the upper 30s / lower 40s.  Upon arrival to ED, BP 151/41.  HR in 70s-80 on monitor, has new onset bigeminy which cards believes is the reason that pulse is running low (reading half of his actual HR).   PPM interrogation = no recent events.  Admitted under [facility].  Assessment & Plan:   Principal Problem:   Near syncope Active Problems:   Hypertension   Cardiac pacemaker   Type 2 diabetes mellitus with hyperglycemia ([name])   Sick sinus syndrome ([name])   Orthostatic hypotension   Clotting disorder ([name])   Near-syncope/bigeminy/orthostatic hypotension: Near syncope could very well be due to bigeminy as well as orthostatic hypotension.  Patient is still significantly orthostatic hypotensive.  Patient is very adamant about going home and he would not tell me anything about his symptoms.  Cardiology recommends stopping tamsulosin as that can cause orthostatic hypotension.  We will do that.  They have already reduced Coreg dose and per cardiology, he does not have any bigeminy anymore.  We will also initiate him on normal saline 100 cc/h for 24 hours.  History of PE, clotting disorder: Continue Eliquis.  Essential hypertension: Typically his blood pressure is remaining high other than orthostatic hypotension.  Remains on losartan.  CAD s/p CABG: Continue Eliquis and statin.  Type 2 diabetes mellitus: Takes 15 units of Lantus at home.  Currently on 10 units here and SSI.  Blood sugar controlled.  Hypothyroidism: Continue  Synthroid.  DVT prophylaxis:    Code Status: Partial Code  Family Communication:  None present at bedside.  Plan of care discussed with patient in length, he is adamant on going home but he is not medically stable for discharge and he will be at risk of fall if he were to discharge.  I tried to reach out to his wife so she can convince him however I had to leave voicemail twice.  Status is: Observation  The patient will require care spanning > 2 midnights and should be moved to inpatient because: Hemodynamically unstable  Dispo: The patient is from: Home              Anticipated d/c is to: Home              Patient currently is not medically stable to d/c.   Difficult to place patient No        Estimated body mass index is 24.78 kg/m as calculated from the following:   Height as of this encounter: 5\' 8"  (1.727 m).   Weight as of this encounter: 73.9 kg.     Nutritional Assessment: Body mass index is 24.78 kg/m. Seen by dietician.  I agree with the assessment and plan as outlined below: Nutrition Status:        .  Skin Assessment: I have examined the patient's skin and I agree with the wound assessment as performed by the wound care RN as outlined below:    Consultants:  Cardiology  Procedures:  None  Antimicrobials:  Anti-infectives (From admission, onward)    None          Subjective: Seen and examined.  Patient too upset for being in the ED for so long and the fact that he was kept n.p.o. and was given a cup of water when I saw him around 8:30 AM.  He wanted to get out of here.  He was not willing to engage in any other conversation or willing to tell me any other symptoms.  Objective: Vitals:   03/17/21 0726 03/17/21 0730 03/17/21 0800 03/17/21 1117  BP: (!) 200/91 (!) 176/149 (!) 151/63 (!) 162/61  Pulse: 73 71 70 70  Resp:  17 20 17   Temp:      TempSrc:      SpO2:  98% 96% 98%  Weight:      Height:        Intake/Output Summary (Last 24  hours) at 03/17/2021 1331 Last data filed at 03/17/2021 0829 Gross per 24 hour  Intake 92.69 ml  Output 750 ml  Net -657.31 ml   Filed Weights   03/16/21 1701  Weight: 73.9 kg    Examination:  General exam: Appears calm and comfortable  Respiratory system: Clear to auscultation. Respiratory effort normal. Cardiovascular system: S1 & S2 heard, RRR. No JVD, murmurs, rubs, gallops or clicks. No pedal edema. Gastrointestinal system: Abdomen is nondistended, soft and nontender. No organomegaly or masses felt. Normal bowel sounds heard. Central nervous system: Alert and oriented. No focal neurological deficits. Extremities: Symmetric 5 x 5 power. Skin: No rashes, lesions or ulcers Psychiatry: Judgement and insight appear normal. Mood & affect upset   Data Reviewed: I have personally reviewed following labs and imaging studies  CBC: Recent Labs  Lab 03/16/21 1721  WBC 8.9  NEUTROABS 4.8  HGB 11.8*  HCT 34.9*  MCV 97.2  PLT 156   Basic Metabolic Panel: Recent Labs  Lab 03/16/21 1721 03/17/21 0326  NA 138 138  K 4.0 3.5  CL 105 105  CO2 23 23  GLUCOSE 273* 259*  BUN 19 18  CREATININE 1.16 1.04  CALCIUM 8.9 8.8*  MG 1.8 1.9  PHOS 3.6  --    GFR: Estimated Creatinine Clearance: 45.7 mL/min (by C-G formula based on SCr of 1.04 mg/dL). Liver Function Tests: Recent Labs  Lab 03/16/21 1721  AST 29  ALT 22  ALKPHOS 47  BILITOT 0.2*  PROT 6.2*  ALBUMIN 3.3*   No results for input(s): LIPASE, AMYLASE in the last 168 hours. No results for input(s): AMMONIA in the last 168 hours. Coagulation Profile: No results for input(s): INR, PROTIME in the last 168 hours. Cardiac Enzymes: No results for input(s): CKTOTAL, CKMB, CKMBINDEX, TROPONINI in the last 168 hours. BNP (last 3 results) No results for input(s): PROBNP in the last 8760 hours. HbA1C: Recent Labs    03/16/21 2135  HGBA1C 7.4*   CBG: Recent Labs  Lab 03/16/21 2207 03/17/21 0729 03/17/21 1145   GLUCAP 124* 169* 124*   Lipid Profile: No results for input(s): CHOL, HDL, LDLCALC, TRIG, CHOLHDL, LDLDIRECT in the last 72 hours. Thyroid Function Tests: Recent Labs    03/16/21 2155  TSH 3.529   Anemia Panel: No results for input(s): VITAMINB12, FOLATE, FERRITIN, TIBC, IRON, RETICCTPCT in the last 72 hours. Sepsis Labs: No results for input(s): PROCALCITON, LATICACIDVEN in the last 168 hours.  No results found for this or any previous visit (from the past 240 hour(s)).    Radiology Studies: DG Chest Portable 1  View  Result Date: 03/16/2021 CLINICAL DATA:  Weakness bradycardia EXAM: PORTABLE CHEST 1 VIEW COMPARISON:  08/19/2020 FINDINGS: Cardiac shadow is enlarged. Postsurgical changes and pacing device are again noted and stable. The lungs are clear bilaterally. No bony abnormality is seen. Old rib fractures are noted on the left stable from the prior study. IMPRESSION: No active disease. Electronically Signed   By: Alcide Clever M.D.   On: 03/16/2021 18:21    Scheduled Meds:  apixaban  5 mg Oral BID   carvedilol  6.25 mg Oral BID WC   ferrous sulfate  325 mg Oral Q breakfast   insulin aspart  0-15 Units Subcutaneous TID WC   insulin glargine-yfgn  10 Units Subcutaneous Daily   levothyroxine  100 mcg Oral QAC breakfast   losartan  25 mg Oral Daily   mirtazapine  7.5 mg Oral QHS   rosuvastatin  20 mg Oral QHS   sodium chloride flush  3 mL Intravenous Q12H   tamsulosin  0.4 mg Oral QPC supper   Continuous Infusions:  sodium chloride 100 mL/hr at 03/17/21 1204     LOS: 0 days   Time spent: 31 minutes   Hughie Closs, MD [facility]  03/17/2021, 1:31 PM  Please page via Amion and do not message via secure chat for anything urgent. Secure chat can be used for anything non urgent.  How to contact the [facility] Attending or Consulting provider 7A - 7P or covering provider during after hours 7P -7A, for this patient?  Check the care team in [facility] and look for a)  attending/consulting [facility] provider listed and b) the [facility] team listed. Page or secure chat 7A-7P. Log into [email or web address] and use [facility]'s universal password to access. If you do not have the password, please contact the hospital operator. Locate the [facility] provider you are looking for under [facility] and page to a number that you can be directly reached. If you still have difficulty reaching the provider, please page the [facility] (Director on Call) for the Hospitalists listed on amion for assistance.

## 2021-03-18 DIAGNOSIS — I498 Other specified cardiac arrhythmias: Secondary | ICD-10-CM | POA: Diagnosis not present

## 2021-03-18 DIAGNOSIS — I459 Conduction disorder, unspecified: Secondary | ICD-10-CM

## 2021-03-18 DIAGNOSIS — Z950 Presence of cardiac pacemaker: Secondary | ICD-10-CM | POA: Diagnosis not present

## 2021-03-18 DIAGNOSIS — R55 Syncope and collapse: Secondary | ICD-10-CM | POA: Diagnosis not present

## 2021-03-18 DIAGNOSIS — I951 Orthostatic hypotension: Secondary | ICD-10-CM | POA: Diagnosis not present

## 2021-03-18 LAB — GLUCOSE, CAPILLARY
Glucose-Capillary: 113 mg/dL — ABNORMAL HIGH (ref 70–99)
Glucose-Capillary: 178 mg/dL — ABNORMAL HIGH (ref 70–99)
Glucose-Capillary: 161 mg/dL — ABNORMAL HIGH (ref 70–99)
Glucose-Capillary: 174 mg/dL — ABNORMAL HIGH (ref 70–99)

## 2021-03-18 LAB — BASIC METABOLIC PANEL
Anion gap: 10 (ref 5–15)
BUN: 13 mg/dL (ref 8–23)
CO2: 23 mmol/L (ref 22–32)
Calcium: 8.5 mg/dL — ABNORMAL LOW (ref 8.9–10.3)
Chloride: 107 mmol/L (ref 98–111)
Creatinine, Ser: 0.95 mg/dL (ref 0.61–1.24)
GFR, Estimated: >60 mL/min (ref >60)
Glucose, Bld: 97 mg/dL (ref 70–99)
Potassium: 3.7 mmol/L (ref 3.5–5.1)
Sodium: 140 mmol/L (ref 135–145)

## 2021-03-18 MED ORDER — INSULIN ASPART 100 UNIT/ML IJ SOLN
0.0000 [IU] | Freq: Three times a day (TID) | INTRAMUSCULAR | Status: DC
  Administered 2021-03-19: 3 [IU] via SUBCUTANEOUS
  Administered 2021-03-19: 5 [IU] via SUBCUTANEOUS
  Administered 2021-03-19 – 2021-03-20 (×4): 3 [IU] via SUBCUTANEOUS
  Administered 2021-03-21: 5 [IU] via SUBCUTANEOUS
  Administered 2021-03-21: 3 [IU] via SUBCUTANEOUS

## 2021-03-18 NOTE — Progress Notes (Signed)
Physical Therapy Treatment Patient Details Name: Thomas [name] MRN: [id number] DOB: Jun 05, 1929 Today's Date: 03/18/2021   History of Present Illness Pt is a 87 y/o male admitted 9/19 secondary to near syncope. Found to have ventricular bigeminy as well. PMH includes HTN, s/p pacemaker, DM, CAD s/p CABG, and CVA.    PT Comments    Pt is requiring mod-maxA to perform functional transfers at this time, primarily due to pt having a posterior lean and displaying lower extremity and core weakness. Practiced transfers multiple times from various surfaces, but pt continues to lack ability to gain adequate forward momentum and hip extension. Completed session with performing exercises to address his lower extremity extensor muscle weakness along with his core/trunk weakness. Will continue to follow acutely. Pt would benefit from a short-term rehab stay at a SNF to maximize his safety and independence with functional mobility prior to return home.   Recommendations for follow up therapy are one component of a multi-disciplinary discharge planning process, led by the attending physician.  Recommendations may be updated based on patient status, additional functional criteria and insurance authorization.  Follow Up Recommendations  SNF;Supervision for mobility/OOB     Equipment Recommendations  3in1 (PT)    Recommendations for Other Services       Precautions / Restrictions Precautions Precautions: Fall;Other (comment) Precaution Comments: watch BP; urine incontinence Required Braces or Orthoses: Other Brace Other Brace: Wears soft knee brace on L knee at baseline Restrictions Weight Bearing Restrictions: No     Mobility  Bed Mobility Overal bed mobility: Needs Assistance Bed Mobility: Sit to Supine       Sit to supine: Mod assist   General bed mobility comments: ModA to manage legs with return to supine.    Transfers Overall transfer level: Needs assistance Equipment used:  Rolling walker (2 wheeled) Transfers: Sit to/from Stand Sit to Stand: Mod assist;Max assist         General transfer comment: Sit to stand from commode 2x needing mod-maxA due to poor hip extension, pt with posterior lean, poor forward momentum, and thus feet slipping anteriorly. Additional x5 reps from EOB, cuing pt to flex at hips and bring "nose over toes" or transition chest anteriorly towards PT, but pt still with posterior lean and increased lower extremity fatigue with subsequent reps, ranging from needing mod-maxA to come to full upright stand.  Ambulation/Gait Ambulation/Gait assistance: Mod assist Gait Distance (Feet): 4 Feet Assistive device: Rolling walker (2 wheeled) Gait Pattern/deviations: Step-through pattern;Decreased stride length;Shuffle;Trunk flexed Gait velocity: reduced Gait velocity interpretation: <1.31 ft/sec, indicative of household ambulator General Gait Details: Pt with short, unsteady steps with a posterior bias and flexed trunk, needing up to modA to steady with stepping towards bed from commode.   Stairs             Wheelchair Mobility    Modified Rankin (Stroke Patients Only)       Balance Overall balance assessment: Needs assistance Sitting-balance support: Feet supported;Bilateral upper extremity supported Sitting balance-Leahy Scale: Poor Sitting balance - Comments: Pt with posterior bias, especially when fatigued, needing up to maxA at times but as little as min guard when he is able to actively flex his trunk anteriorly and hold onto bed rail with UE to remain upright. Postural control: Posterior lean Standing balance support: Bilateral upper extremity supported Standing balance-Leahy Scale: Poor Standing balance comment: Reliant on UE support and mod-maxA to stand due to posterior bias and poor hip extension to obtain upright posture.  Cognition Arousal/Alertness: Awake/alert Behavior During  Therapy: WFL for tasks assessed/performed Overall Cognitive Status: Within Functional Limits for tasks assessed                                 General Comments: Pt with difficulty problem-solving how to correct his deficits, likely baseline per family member present.      Exercises General Exercises - Lower Extremity Quad Sets: Strengthening;Both;10 reps;Supine Gluteal Sets: Strengthening;Both;10 reps;Supine Other Exercises Other Exercises: Modified crunches, sitting up from elevated HOB, 2x5 reps Other Exercises: Scapular adduction 5x supine    General Comments        Pertinent Vitals/Pain Pain Assessment: Faces Faces Pain Scale: No hurt Pain Intervention(s): Monitored during session    Home Living                      Prior Function            PT Goals (current goals can now be found in the care plan section) Acute Rehab PT Goals Patient Stated Goal: to be able to walk and stand better PT Goal Formulation: With patient/family Time For Goal Achievement: 03/31/21 Potential to Achieve Goals: Good Progress towards PT goals: Progressing toward goals    Frequency    Min 2X/week      PT Plan Discharge plan needs to be updated;Frequency needs to be updated    Co-evaluation              AM-PAC PT "6 Clicks" Mobility   Outcome Measure  Help needed turning from your back to your side while in a flat bed without using bedrails?: A Little Help needed moving from lying on your back to sitting on the side of a flat bed without using bedrails?: A Lot Help needed moving to and from a bed to a chair (including a wheelchair)?: A Lot Help needed standing up from a chair using your arms (e.g., wheelchair or bedside chair)?: A Lot Help needed to walk in hospital room?: A Lot Help needed climbing 3-5 steps with a railing? : Total 6 Click Score: 12    End of Session Equipment Utilized During Treatment: Gait belt Activity Tolerance: Patient  limited by fatigue;Patient tolerated treatment well Patient left: in bed;with call bell/phone within reach;with bed alarm set;with family/visitor present   PT Visit Diagnosis: Unsteadiness on feet (R26.81);Muscle weakness (generalized) (M62.81);Difficulty in walking, not elsewhere classified (R26.2);Other abnormalities of gait and mobility (R26.89)     Time: 4037-0964 PT Time Calculation (min) (ACUTE ONLY): 23 min  Charges:  $Therapeutic Exercise: 8-22 mins $Therapeutic Activity: 8-22 mins                     Raymond Gurney, PT, DPT Acute Rehabilitation Services  Pager: [phone] Office: [phone]    Jewel Baize 03/18/2021, 2:51 PM

## 2021-03-18 NOTE — Progress Notes (Signed)
PROGRESS NOTE    Thomas [name]  EHU:[id number] DOB: 12/23/1928 DOA: 03/16/2021 PCP: Ardith Dark, MD   Brief Narrative:  Thomas [name] is a 87 y.o. male with medical history significant of SSS s/p PPM, CAD, CABG 2001, DM2, HLD, HTN, orthostatic hypotension, clotting disorder on eliquis, stroke in March   Presentedto ED with c/o weakness / near syncope today while working with PT.  Unable to stand up due to feeling too weak.  Heart monitor at home showed pulse in the upper 30s / lower 40s.  Upon arrival to ED, BP 151/41.  HR in 70s-80 on monitor, has new onset bigeminy which cards believes is the reason that pulse is running low (reading half of his actual HR).   PPM interrogation = no recent events.  Admitted under TRH.  Assessment & Plan:   Principal Problem:   Near syncope Active Problems:   Hypertension   Cardiac pacemaker   Type 2 diabetes mellitus with hyperglycemia ([name])   Sick sinus syndrome ([name])   Orthostatic hypotension   Clotting disorder ([name])   Near-syncope/bigeminy/orthostatic hypotension: Near syncope could very well be due to bigeminy as well as orthostatic hypotension.  Patient is still orthostatic positive but improved compared to yesterday.  Per cardiology, still having bigeminy but not having any symptoms.  They recommended continuing Coreg.  I believe with IV fluids, his orthostatics are improving.  I will continue IV fluids for 10 more hours.  History of PE, clotting disorder: Continue Eliquis.  Essential hypertension: Fairly controlled.  Remains on losartan.  CAD s/p CABG: Continue Eliquis and statin.  Type 2 diabetes mellitus: Takes 15 units of Lantus at home.  Currently on 10 units here and SSI.  Blood sugar controlled.  Hypothyroidism: Continue Synthroid.  DVT prophylaxis:    Code Status: Partial Code  Family Communication:  None present at bedside.  Plan of care discussed with patient's wife over the phone and I informed her that PT  recommends SNF.  She tells me that patient would never go to SNF due to his previous bad experience however she is going to talk to her son to see what they can do to provide 24-hour supervision at home.  I did inform her that he needs more fluids and and the night of hospital and perhaps will be ready for discharge tomorrow.  She says she will get back to me Status is: Inpatient  Remains inpatient appropriate because:Hemodynamically unstable  Dispo: The patient is from: Home              Anticipated d/c is to: Home              Patient currently is not medically stable to d/c.   Difficult to place patient No         Estimated body mass index is 24.78 kg/m as calculated from the following:   Height as of this encounter: 5\' 8"  (1.727 m).   Weight as of this encounter: 73.9 kg.     Nutritional Assessment: Body mass index is 24.78 kg/m.Marland Kitchen Seen by dietician.  I agree with the assessment and plan as outlined below: Nutrition Status:        .  Skin Assessment: I have examined the patient's skin and I agree with the wound assessment as performed by the wound care RN as outlined below:    Consultants:  Cardiology  Procedures:  None  Antimicrobials:  Anti-infectives (From admission, onward)    None  Subjective: Seen and examined.  Pleasant today.  Has no complaints.  Objective: Vitals:   03/17/21 2004 03/17/21 2358 03/18/21 0437 03/18/21 0856  BP: (!) 143/73 115/62 (!) 181/72 (!) 150/65  Pulse: 70 71 70 70  Resp: 17 18 18    Temp: 98.1 F (36.7 C) 98.6 F (37 C) 98.8 F (37.1 C)   TempSrc: Oral Oral Oral   SpO2: 97% 96% 98%   Weight:      Height:        Intake/Output Summary (Last 24 hours) at 03/18/2021 1020 Last data filed at 03/18/2021 1003 Gross per 24 hour  Intake 356 ml  Output 1000 ml  Net -644 ml    Filed Weights   03/16/21 1701  Weight: 73.9 kg    Examination:  General exam: Appears calm and comfortable  Respiratory system:  Clear to auscultation. Respiratory effort normal. Cardiovascular system: S1 & S2 heard, RRR. No JVD, murmurs, rubs, gallops or clicks. No pedal edema. Gastrointestinal system: Abdomen is nondistended, soft and nontender. No organomegaly or masses felt. Normal bowel sounds heard. Central nervous system: Alert and oriented. No focal neurological deficits. Extremities: Symmetric 5 x 5 power. Skin: No rashes, lesions or ulcers.  Psychiatry: Judgement and insight appear normal. Mood & affect appropriate.    Data Reviewed: I have personally reviewed following labs and imaging studies  CBC: Recent Labs  Lab 03/16/21 1721  WBC 8.9  NEUTROABS 4.8  HGB 11.8*  HCT 34.9*  MCV 97.2  PLT 156    Basic Metabolic Panel: Recent Labs  Lab 03/16/21 1721 03/17/21 0326 03/18/21 0322  NA 138 138 140  K 4.0 3.5 3.7  CL 105 105 107  CO2 23 23 23   GLUCOSE 273* 259* 97  BUN 19 18 13   CREATININE 1.16 1.04 0.95  CALCIUM 8.9 8.8* 8.5*  MG 1.8 1.9  --   PHOS 3.6  --   --     GFR: Estimated Creatinine Clearance: 50 mL/min (by C-G formula based on SCr of 0.95 mg/dL). Liver Function Tests: Recent Labs  Lab 03/16/21 1721  AST 29  ALT 22  ALKPHOS 47  BILITOT 0.2*  PROT 6.2*  ALBUMIN 3.3*    No results for input(s): LIPASE, AMYLASE in the last 168 hours. No results for input(s): AMMONIA in the last 168 hours. Coagulation Profile: No results for input(s): INR, PROTIME in the last 168 hours. Cardiac Enzymes: No results for input(s): CKTOTAL, CKMB, CKMBINDEX, TROPONINI in the last 168 hours. BNP (last 3 results) No results for input(s): PROBNP in the last 8760 hours. HbA1C: Recent Labs    03/16/21 2135  HGBA1C 7.4*    CBG: Recent Labs  Lab 03/17/21 0729 03/17/21 1145 03/17/21 1647 03/17/21 2053 03/18/21 0548  GLUCAP 169* 124* 129* 123* 113*    Lipid Profile: No results for input(s): CHOL, HDL, LDLCALC, TRIG, CHOLHDL, LDLDIRECT in the last 72 hours. Thyroid Function  Tests: Recent Labs    03/16/21 2155  TSH 3.529    Anemia Panel: No results for input(s): VITAMINB12, FOLATE, FERRITIN, TIBC, IRON, RETICCTPCT in the last 72 hours. Sepsis Labs: No results for input(s): PROCALCITON, LATICACIDVEN in the last 168 hours.  Recent Results (from the past 240 hour(s))  Resp Panel by RT-PCR (Flu A&B, Covid)     Status: None   Collection Time: 03/17/21  2:26 PM  Result Value Ref Range Status   SARS Coronavirus 2 by RT PCR NEGATIVE NEGATIVE Final    Comment: (NOTE) SARS-CoV-2 target nucleic acids  are NOT DETECTED.  The SARS-CoV-2 RNA is generally detectable in upper respiratory specimens during the acute phase of infection. The lowest concentration of SARS-CoV-2 viral copies this assay can detect is 138 copies/mL. A negative result does not preclude SARS-Cov-2 infection and should not be used as the sole basis for treatment or other patient management decisions. A negative result may occur with  improper specimen collection/handling, submission of specimen other than nasopharyngeal swab, presence of viral mutation(s) within the areas targeted by this assay, and inadequate number of viral copies(<138 copies/mL). A negative result must be combined with clinical observations, patient history, and epidemiological information. The expected result is Negative.  Fact Sheet for Patients:  BloggerCourse.com  Fact Sheet for Healthcare Providers:  SeriousBroker.it  This test is no t yet approved or cleared by the [place] FDA and  has been authorized for detection and/or diagnosis of SARS-CoV-2 by FDA under an Emergency Use Authorization (EUA). This EUA will remain  in effect (meaning this test can be used) for the duration of the COVID-19 declaration under Section 564(b)(1) of the Act, 21 U.S.C.section 360bbb-3(b)(1), unless the authorization is terminated  or revoked sooner.       Influenza A by PCR  NEGATIVE NEGATIVE Final   Influenza B by PCR NEGATIVE NEGATIVE Final    Comment: (NOTE) The Xpert Xpress SARS-CoV-2/FLU/RSV plus assay is intended as an aid in the diagnosis of influenza from Nasopharyngeal swab specimens and should not be used as a sole basis for treatment. Nasal washings and aspirates are unacceptable for Xpert Xpress SARS-CoV-2/FLU/RSV testing.  Fact Sheet for Patients: BloggerCourse.com  Fact Sheet for Healthcare Providers: SeriousBroker.it  This test is not yet approved or cleared by the [place] FDA and has been authorized for detection and/or diagnosis of SARS-CoV-2 by FDA under an Emergency Use Authorization (EUA). This EUA will remain in effect (meaning this test can be used) for the duration of the COVID-19 declaration under Section 564(b)(1) of the Act, 21 U.S.C. section 360bbb-3(b)(1), unless the authorization is terminated or revoked.  Performed at [facility], [address], [place], [place] [address]       Radiology Studies: DG Chest Portable 1 View  Result Date: 03/16/2021 CLINICAL DATA:  Weakness bradycardia EXAM: PORTABLE CHEST 1 VIEW COMPARISON:  08/19/2020 FINDINGS: Cardiac shadow is enlarged. Postsurgical changes and pacing device are again noted and stable. The lungs are clear bilaterally. No bony abnormality is seen. Old rib fractures are noted on the left stable from the prior study. IMPRESSION: No active disease. Electronically Signed   By: Alcide Clever M.D.   On: 03/16/2021 18:21   ECHOCARDIOGRAM COMPLETE  Result Date: 03/17/2021    ECHOCARDIOGRAM REPORT   Patient Name:   KORBEN CARCIONE Date of Exam: 03/17/2021 Medical Rec #:  [id number]       Height:       68.0 in Accession #:    [id number]      Weight:       163.0 lb Date of Birth:  [birth year]       BSA:          1.874 m Patient Age:    89 years        BP:           162/61 mmHg Patient Gender: M               HR:           72  bpm. Exam Location:  Inpatient  Procedure: 2D Echo Indications:    CAD of native vessel  History:        Patient has prior history of Echocardiogram examinations, most                 recent 08/07/2020. Prior CABG and Pacemaker; Risk                 Factors:Hypertension, Dyslipidemia and Diabetes.  Sonographer:    Delcie Roch RDCS Referring Phys: [id number] CHRISTOPHER L SCHUMANN IMPRESSIONS  1. Left ventricular ejection fraction, by estimation, is 60 to 65%. The left ventricle has normal function. The left ventricle has no regional wall motion abnormalities. There is moderate asymmetric left ventricular hypertrophy of the septal segment. Left ventricular diastolic parameters are consistent with Grade I diastolic dysfunction (impaired relaxation).  2. Right ventricular systolic function is normal. The right ventricular size is normal. There is normal pulmonary artery systolic pressure. The estimated right ventricular systolic pressure is 25.8 mmHg.  3. Left atrial size was moderately dilated.  4. The mitral valve is degenerative. Mild mitral valve regurgitation. No evidence of mitral stenosis.  5. The aortic valve is calcified. Aortic valve regurgitation is not visualized. Mild to moderate aortic valve sclerosis/calcification is present, without any evidence of aortic stenosis.  6. Aortic dilatation noted. There is mild dilatation of the aortic root, measuring 38 mm.  7. The inferior vena cava is normal in size with greater than 50% respiratory variability, suggesting right atrial pressure of 3 mmHg. FINDINGS  Left Ventricle: Left ventricular ejection fraction, by estimation, is 60 to 65%. The left ventricle has normal function. The left ventricle has no regional wall motion abnormalities. The left ventricular internal cavity size was normal in size. There is  moderate asymmetric left ventricular hypertrophy of the septal segment. Left ventricular diastolic parameters are consistent with Grade I diastolic  dysfunction (impaired relaxation). Normal left ventricular filling pressure. Right Ventricle: The right ventricular size is normal. No increase in right ventricular wall thickness. Right ventricular systolic function is normal. There is normal pulmonary artery systolic pressure. The tricuspid regurgitant velocity is 2.39 m/s, and  with an assumed right atrial pressure of 3 mmHg, the estimated right ventricular systolic pressure is 25.8 mmHg. Left Atrium: Left atrial size was moderately dilated. Right Atrium: Right atrial size was normal in size. Pericardium: There is no evidence of pericardial effusion. Mitral Valve: The mitral valve is degenerative in appearance. There is mild thickening of the mitral valve leaflet(s). There is mild calcification of the mitral valve leaflet(s). Mild to moderate mitral annular calcification. Mild mitral valve regurgitation. No evidence of mitral valve stenosis. Tricuspid Valve: The tricuspid valve is normal in structure. Tricuspid valve regurgitation is mild . No evidence of tricuspid stenosis. Aortic Valve: The aortic valve is calcified. Aortic valve regurgitation is not visualized. Mild to moderate aortic valve sclerosis/calcification is present, without any evidence of aortic stenosis. Pulmonic Valve: The pulmonic valve was normal in structure. Pulmonic valve regurgitation is not visualized. No evidence of pulmonic stenosis. Aorta: Aortic dilatation noted. There is mild dilatation of the aortic root, measuring 38 mm. Venous: The inferior vena cava is normal in size with greater than 50% respiratory variability, suggesting right atrial pressure of 3 mmHg. IAS/Shunts: No atrial level shunt detected by color flow Doppler. Additional Comments: A device lead is visualized.  LEFT VENTRICLE PLAX 2D LVIDd:         4.60 cm     Diastology LVIDs:  3.10 cm     LV e' medial:    3.26 cm/s LV PW:         1.00 cm     LV E/e' medial:  15.4 LV IVS:        1.50 cm     LV e' lateral:   6.09  cm/s LVOT diam:     1.90 cm     LV E/e' lateral: 8.2 LV SV:         48 LV SV Index:   26 LVOT Area:     2.84 cm  LV Volumes (MOD) LV vol d, MOD A4C: 79.8 ml LV vol s, MOD A4C: 51.2 ml LV SV MOD A4C:     79.8 ml RIGHT VENTRICLE RV S prime:     7.83 cm/s TAPSE (M-mode): 1.4 cm LEFT ATRIUM             Index       RIGHT ATRIUM           Index LA diam:        4.20 cm 2.24 cm/m  RA Area:     14.90 cm LA Vol (A2C):   80.3 ml 42.86 ml/m RA Volume:   35.60 ml  19.00 ml/m LA Vol (A4C):   75.4 ml 40.24 ml/m LA Biplane Vol: 81.9 ml 43.71 ml/m  AORTIC VALVE LVOT Vmax:   82.70 cm/s LVOT Vmean:  56.600 cm/s LVOT VTI:    0.170 m  AORTA Ao Root diam: 3.80 cm Ao Asc diam:  3.60 cm MV E velocity: 50.10 cm/s  TRICUSPID VALVE MV A velocity: 92.10 cm/s  TR Peak grad:   22.8 mmHg MV E/A ratio:  0.54        TR Vmax:        239.00 cm/s                             SHUNTS                            Systemic VTI:  0.17 m                            Systemic Diam: 1.90 cm Armanda Magic MD Electronically signed by Armanda Magic MD Signature Date/Time: 03/17/2021/4:59:06 PM    Final     Scheduled Meds:  apixaban  5 mg Oral BID   carvedilol  6.25 mg Oral BID WC   ferrous sulfate  325 mg Oral Q breakfast   insulin aspart  0-15 Units Subcutaneous TID WC   insulin glargine-yfgn  10 Units Subcutaneous Daily   levothyroxine  100 mcg Oral QAC breakfast   losartan  25 mg Oral Daily   mirtazapine  7.5 mg Oral QHS   rosuvastatin  20 mg Oral QHS   sodium chloride flush  3 mL Intravenous Q12H   Continuous Infusions:  sodium chloride 100 mL/hr at 03/17/21 1204     LOS: 1 day   Time spent: 28 minutes   Hughie Closs, MD [facility]  03/18/2021, 10:20 AM  Please page via Loretha Stapler and do not message via secure chat for anything urgent. Secure chat can be used for anything non urgent.  How to contact the [facility] Attending or Consulting provider 7A - 7P or covering provider during after hours 7P -7A, for this patient?  Check the  care  team in [facility] and look for a) attending/consulting [facility] provider listed and b) the [facility] team listed. Page or secure chat 7A-7P. Log into [email or web address] and use [place]'s universal password to access. If you do not have the password, please contact the hospital operator. Locate the [facility] provider you are looking for under [facility] and page to a number that you can be directly reached. If you still have difficulty reaching the provider, please page the [facility] (Director on Call) for the Hospitalists listed on amion for assistance.

## 2021-03-18 NOTE — Progress Notes (Signed)
Patient's wife called for an update. RN provided patient's wife with the current plan of care. Patient's wife does not have questions for RN to answer for.

## 2021-03-18 NOTE — Progress Notes (Signed)
DAILY PROGRESS NOTE   [name] Date of Encounter: 03/18/2021 Cardiologist: Little Ishikawa, MD  Chief Complaint   Wants to go home again  Patient Profile   Thomas [name] is a 87 y.o. male with a hx of CAD status post CABG in 2001, sick sinus syndrome status post PPM, pulmonary embolism, CVA, hypertension, T2DM, hyperlipidemia, orthostatic hypotension, hypothyroidism who is being seen 03/16/2021 for the evaluation of weakness/bradycardia at the request of Dr Madilyn Fireman.  Subjective   Orthostasis appears improved this morning - negative according to the OT evaluation. Taken off tamsulosin, coreg reduced and now on low dose losartain. BP improved - would not try to reach 120/80, though, due to risk of orthostatic syncope - generally, this is a good BP to maintain. Still having some bigeminy, however, not sure he is symptomatic with it.  Objective   Vitals:   03/17/21 2004 03/17/21 2358 03/18/21 0437 03/18/21 0856  BP: (!) 143/73 115/62 (!) 181/72 (!) 150/65  Pulse: 70 71 70 70  Resp: 17 18 18    Temp: 98.1 F (36.7 C) 98.6 F (37 C) 98.8 F (37.1 C)   TempSrc: Oral Oral Oral   SpO2: 97% 96% 98%   Weight:      Height:        Intake/Output Summary (Last 24 hours) at 03/18/2021 03/20/2021 Last data filed at 03/18/2021 03/20/2021 Gross per 24 hour  Intake --  Output 1000 ml  Net -1000 ml   Filed Weights   03/16/21 1701  Weight: 73.9 kg    Physical Exam   General appearance: alert, appears stated age, and no distress Neck: no carotid bruit, no JVD, and thyroid not enlarged, symmetric, no tenderness/mass/nodules Lungs: clear to auscultation bilaterally Heart: regular rate and rhythm, S1, S2 normal, and pacemaker noted Abdomen: soft, non-tender; bowel sounds normal; no masses,  no organomegaly and scaphoid Extremities: extremities normal, atraumatic, no cyanosis or edema Pulses: 2+ and symmetric Skin: Skin color, texture, turgor normal. No rashes or  lesions Neurologic: Grossly normal Psych: Pleasant  Inpatient Medications    Scheduled Meds:  apixaban  5 mg Oral BID   carvedilol  6.25 mg Oral BID WC   ferrous sulfate  325 mg Oral Q breakfast   insulin aspart  0-15 Units Subcutaneous TID WC   insulin glargine-yfgn  10 Units Subcutaneous Daily   levothyroxine  100 mcg Oral QAC breakfast   losartan  25 mg Oral Daily   mirtazapine  7.5 mg Oral QHS   rosuvastatin  20 mg Oral QHS   sodium chloride flush  3 mL Intravenous Q12H    Continuous Infusions:  sodium chloride 100 mL/hr at 03/17/21 1204    PRN Meds: acetaminophen **OR** acetaminophen, ondansetron **OR** ondansetron (ZOFRAN) IV   Labs   Results for orders placed or performed during the hospital encounter of 03/16/21 (from the past 48 hour(s))  CBC with Differential     Status: Abnormal   Collection Time: 03/16/21  5:21 PM  Result Value Ref Range   WBC 8.9 4.0 - 10.5 K/uL   RBC 3.59 (L) 4.22 - 5.81 MIL/uL   Hemoglobin 11.8 (L) 13.0 - 17.0 g/dL   HCT 03/18/21 (L) 32.6 - 71.2 %   MCV 97.2 80.0 - 100.0 fL   MCH 32.9 26.0 - 34.0 pg   MCHC 33.8 30.0 - 36.0 g/dL   RDW 45.8 09.9 - 83.3 %   Platelets 156 150 - 400 K/uL   nRBC 0.0 0.0 - 0.2 %  Neutrophils Relative % 54 %   Neutro Abs 4.8 1.7 - 7.7 K/uL   Lymphocytes Relative 36 %   Lymphs Abs 3.2 0.7 - 4.0 K/uL   Monocytes Relative 8 %   Monocytes Absolute 0.7 0.1 - 1.0 K/uL   Eosinophils Relative 2 %   Eosinophils Absolute 0.2 0.0 - 0.5 K/uL   Basophils Relative 0 %   Basophils Absolute 0.0 0.0 - 0.1 K/uL   Immature Granulocytes 0 %   Abs Immature Granulocytes 0.03 0.00 - 0.07 K/uL    Comment: Performed at [facility], [address] [place], [place] [address]  Comprehensive metabolic panel     Status: Abnormal   Collection Time: 03/16/21  5:21 PM  Result Value Ref Range   Sodium 138 135 - 145 mmol/L   Potassium 4.0 3.5 - 5.1 mmol/L    Comment: SLIGHT HEMOLYSIS   Chloride 105 98 - 111 mmol/L   CO2 23 22 -  32 mmol/L   Glucose, Bld 273 (H) 70 - 99 mg/dL    Comment: Glucose reference range applies only to samples taken after fasting for at least 8 hours.   BUN 19 8 - 23 mg/dL   Creatinine, Ser 8.09 0.61 - 1.24 mg/dL   Calcium 8.9 8.9 - 98.3 mg/dL   Total Protein 6.2 (L) 6.5 - 8.1 g/dL   Albumin 3.3 (L) 3.5 - 5.0 g/dL   AST 29 15 - 41 U/L   ALT 22 0 - 44 U/L   Alkaline Phosphatase 47 38 - 126 U/L   Total Bilirubin 0.2 (L) 0.3 - 1.2 mg/dL   GFR, Estimated 60 (L) >60 mL/min    Comment: (NOTE) Calculated using the CKD-EPI Creatinine Equation (2021)    Anion gap 10 5 - 15    Comment: Performed at [facility], [address], [place], [place] [address]  Magnesium     Status: None   Collection Time: 03/16/21  5:21 PM  Result Value Ref Range   Magnesium 1.8 1.7 - 2.4 mg/dL    Comment: Performed at [facility], [address], [place], [place] [address]  Phosphorus     Status: None   Collection Time: 03/16/21  5:21 PM  Result Value Ref Range   Phosphorus 3.6 2.5 - 4.6 mg/dL    Comment: Performed at [facility], [address], [place], [place] [address]  Hemoglobin A1c     Status: Abnormal   Collection Time: 03/16/21  9:35 PM  Result Value Ref Range   Hgb A1c MFr Bld 7.4 (H) 4.8 - 5.6 %    Comment: (NOTE) Pre diabetes:          5.7%-6.4%  Diabetes:              >6.4%  Glycemic control for   <7.0% adults with diabetes    Mean Plasma Glucose 165.68 mg/dL    Comment: Performed at [facility], [address], [place], [place] [address]  TSH     Status: None   Collection Time: 03/16/21  9:55 PM  Result Value Ref Range   TSH 3.529 0.350 - 4.500 uIU/mL    Comment: Performed by a 3rd Generation assay with a functional sensitivity of <=0.01 uIU/mL. Performed at [facility], [address], [place], [place] [address]   CBG monitoring, ED     Status: Abnormal   Collection Time: 03/16/21 10:07 PM  Result Value Ref Range  Glucose-Capillary 124 (H) 70 - 99  mg/dL    Comment: Glucose reference range applies only to samples taken after fasting for at least 8 hours.  Basic metabolic panel     Status: Abnormal   Collection Time: 03/17/21  3:26 AM  Result Value Ref Range   Sodium 138 135 - 145 mmol/L   Potassium 3.5 3.5 - 5.1 mmol/L   Chloride 105 98 - 111 mmol/L   CO2 23 22 - 32 mmol/L   Glucose, Bld 259 (H) 70 - 99 mg/dL    Comment: Glucose reference range applies only to samples taken after fasting for at least 8 hours.   BUN 18 8 - 23 mg/dL   Creatinine, Ser 3.61 0.61 - 1.24 mg/dL   Calcium 8.8 (L) 8.9 - 10.3 mg/dL   GFR, Estimated >44 >31 mL/min    Comment: (NOTE) Calculated using the CKD-EPI Creatinine Equation (2021)    Anion gap 10 5 - 15    Comment: Performed at [facility], [address] [place], [place] [address]  Magnesium     Status: None   Collection Time: 03/17/21  3:26 AM  Result Value Ref Range   Magnesium 1.9 1.7 - 2.4 mg/dL    Comment: Performed at [facility], [address], [place], [place] [address]  CBG monitoring, ED     Status: Abnormal   Collection Time: 03/17/21  7:29 AM  Result Value Ref Range   Glucose-Capillary 169 (H) 70 - 99 mg/dL    Comment: Glucose reference range applies only to samples taken after fasting for at least 8 hours.  CBG monitoring, ED     Status: Abnormal   Collection Time: 03/17/21 11:45 AM  Result Value Ref Range   Glucose-Capillary 124 (H) 70 - 99 mg/dL    Comment: Glucose reference range applies only to samples taken after fasting for at least 8 hours.  Resp Panel by RT-PCR (Flu A&B, Covid)     Status: None   Collection Time: 03/17/21  2:26 PM  Result Value Ref Range   SARS Coronavirus 2 by RT PCR NEGATIVE NEGATIVE    Comment: (NOTE) SARS-CoV-2 target nucleic acids are NOT DETECTED.  The SARS-CoV-2 RNA is generally detectable in upper respiratory specimens during the acute phase of infection. The lowest concentration of SARS-CoV-2 viral copies this assay can  detect is 138 copies/mL. A negative result does not preclude SARS-Cov-2 infection and should not be used as the sole basis for treatment or other patient management decisions. A negative result may occur with  improper specimen collection/handling, submission of specimen other than nasopharyngeal swab, presence of viral mutation(s) within the areas targeted by this assay, and inadequate number of viral copies(<138 copies/mL). A negative result must be combined with clinical observations, patient history, and epidemiological information. The expected result is Negative.  Fact Sheet for Patients:  BloggerCourse.com  Fact Sheet for Healthcare Providers:  SeriousBroker.it  This test is no t yet approved or cleared by the [place] FDA and  has been authorized for detection and/or diagnosis of SARS-CoV-2 by FDA under an Emergency Use Authorization (EUA). This EUA will remain  in effect (meaning this test can be used) for the duration of the COVID-19 declaration under Section 564(b)(1) of the Act, 21 U.S.C.section 360bbb-3(b)(1), unless the authorization is terminated  or revoked sooner.       Influenza A by PCR NEGATIVE NEGATIVE   Influenza B by PCR NEGATIVE NEGATIVE    Comment: (NOTE)  The Xpert Xpress SARS-CoV-2/FLU/RSV plus assay is intended as an aid in the diagnosis of influenza from Nasopharyngeal swab specimens and should not be used as a sole basis for treatment. Nasal washings and aspirates are unacceptable for Xpert Xpress SARS-CoV-2/FLU/RSV testing.  Fact Sheet for Patients: BloggerCourse.com  Fact Sheet for Healthcare Providers: SeriousBroker.it  This test is not yet approved or cleared by the [place] FDA and has been authorized for detection and/or diagnosis of SARS-CoV-2 by FDA under an Emergency Use Authorization (EUA). This EUA will remain in effect  (meaning this test can be used) for the duration of the COVID-19 declaration under Section 564(b)(1) of the Act, 21 U.S.C. section 360bbb-3(b)(1), unless the authorization is terminated or revoked.  Performed at [facility], [address] [place], [place] [address]   CBG monitoring, ED     Status: Abnormal   Collection Time: 03/17/21  4:47 PM  Result Value Ref Range   Glucose-Capillary 129 (H) 70 - 99 mg/dL    Comment: Glucose reference range applies only to samples taken after fasting for at least 8 hours.  Glucose, capillary     Status: Abnormal   Collection Time: 03/17/21  8:53 PM  Result Value Ref Range   Glucose-Capillary 123 (H) 70 - 99 mg/dL    Comment: Glucose reference range applies only to samples taken after fasting for at least 8 hours.   Comment 1 Notify RN    Comment 2 Document in Chart   Basic metabolic panel     Status: Abnormal   Collection Time: 03/18/21  3:22 AM  Result Value Ref Range   Sodium 140 135 - 145 mmol/L   Potassium 3.7 3.5 - 5.1 mmol/L   Chloride 107 98 - 111 mmol/L   CO2 23 22 - 32 mmol/L   Glucose, Bld 97 70 - 99 mg/dL    Comment: Glucose reference range applies only to samples taken after fasting for at least 8 hours.   BUN 13 8 - 23 mg/dL   Creatinine, Ser 2.13 0.61 - 1.24 mg/dL   Calcium 8.5 (L) 8.9 - 10.3 mg/dL   GFR, Estimated >08 >65 mL/min    Comment: (NOTE) Calculated using the CKD-EPI Creatinine Equation (2021)    Anion gap 10 5 - 15    Comment: Performed at [facility], [address] [place], [place] [address]  Glucose, capillary     Status: Abnormal   Collection Time: 03/18/21  5:48 AM  Result Value Ref Range   Glucose-Capillary 113 (H) 70 - 99 mg/dL    Comment: Glucose reference range applies only to samples taken after fasting for at least 8 hours.   Comment 1 Notify RN    Comment 2 Document in Chart     ECG   N/A  Telemetry   Sinus rhythm - Personally Reviewed  Radiology    DG Chest Portable 1  View  Result Date: 03/16/2021 CLINICAL DATA:  Weakness bradycardia EXAM: PORTABLE CHEST 1 VIEW COMPARISON:  08/19/2020 FINDINGS: Cardiac shadow is enlarged. Postsurgical changes and pacing device are again noted and stable. The lungs are clear bilaterally. No bony abnormality is seen. Old rib fractures are noted on the left stable from the prior study. IMPRESSION: No active disease. Electronically Signed   By: Alcide Clever M.D.   On: 03/16/2021 18:21   ECHOCARDIOGRAM COMPLETE  Result Date: 03/17/2021    ECHOCARDIOGRAM REPORT   Patient Name:   PARAG DORTON Date of Exam: 03/17/2021 Medical  Rec #:  [id number]       Height:       68.0 in Accession #:    [id number]      Weight:       163.0 lb Date of Birth:  [birth year]       BSA:          1.874 m Patient Age:    89 years        BP:           162/61 mmHg Patient Gender: M               HR:           72 bpm. Exam Location:  Inpatient Procedure: 2D Echo Indications:    CAD of native vessel  History:        Patient has prior history of Echocardiogram examinations, most                 recent 08/07/2020. Prior CABG and Pacemaker; Risk                 Factors:Hypertension, Dyslipidemia and Diabetes.  Sonographer:    Delcie Roch RDCS Referring Phys: [id number] CHRISTOPHER L SCHUMANN IMPRESSIONS  1. Left ventricular ejection fraction, by estimation, is 60 to 65%. The left ventricle has normal function. The left ventricle has no regional wall motion abnormalities. There is moderate asymmetric left ventricular hypertrophy of the septal segment. Left ventricular diastolic parameters are consistent with Grade I diastolic dysfunction (impaired relaxation).  2. Right ventricular systolic function is normal. The right ventricular size is normal. There is normal pulmonary artery systolic pressure. The estimated right ventricular systolic pressure is 25.8 mmHg.  3. Left atrial size was moderately dilated.  4. The mitral valve is degenerative. Mild mitral valve regurgitation. No  evidence of mitral stenosis.  5. The aortic valve is calcified. Aortic valve regurgitation is not visualized. Mild to moderate aortic valve sclerosis/calcification is present, without any evidence of aortic stenosis.  6. Aortic dilatation noted. There is mild dilatation of the aortic root, measuring 38 mm.  7. The inferior vena cava is normal in size with greater than 50% respiratory variability, suggesting right atrial pressure of 3 mmHg. FINDINGS  Left Ventricle: Left ventricular ejection fraction, by estimation, is 60 to 65%. The left ventricle has normal function. The left ventricle has no regional wall motion abnormalities. The left ventricular internal cavity size was normal in size. There is  moderate asymmetric left ventricular hypertrophy of the septal segment. Left ventricular diastolic parameters are consistent with Grade I diastolic dysfunction (impaired relaxation). Normal left ventricular filling pressure. Right Ventricle: The right ventricular size is normal. No increase in right ventricular wall thickness. Right ventricular systolic function is normal. There is normal pulmonary artery systolic pressure. The tricuspid regurgitant velocity is 2.39 m/s, and  with an assumed right atrial pressure of 3 mmHg, the estimated right ventricular systolic pressure is 25.8 mmHg. Left Atrium: Left atrial size was moderately dilated. Right Atrium: Right atrial size was normal in size. Pericardium: There is no evidence of pericardial effusion. Mitral Valve: The mitral valve is degenerative in appearance. There is mild thickening of the mitral valve leaflet(s). There is mild calcification of the mitral valve leaflet(s). Mild to moderate mitral annular calcification. Mild mitral valve regurgitation. No evidence of mitral valve stenosis. Tricuspid Valve: The tricuspid valve is normal in structure. Tricuspid valve regurgitation is mild . No evidence of tricuspid stenosis. Aortic Valve: The aortic valve is calcified.  Aortic valve regurgitation is not visualized. Mild to moderate aortic valve sclerosis/calcification is present, without any evidence of aortic stenosis. Pulmonic Valve: The pulmonic valve was normal in structure. Pulmonic valve regurgitation is not visualized. No evidence of pulmonic stenosis. Aorta: Aortic dilatation noted. There is mild dilatation of the aortic root, measuring 38 mm. Venous: The inferior vena cava is normal in size with greater than 50% respiratory variability, suggesting right atrial pressure of 3 mmHg. IAS/Shunts: No atrial level shunt detected by color flow Doppler. Additional Comments: A device lead is visualized.  LEFT VENTRICLE PLAX 2D LVIDd:         4.60 cm     Diastology LVIDs:         3.10 cm     LV e' medial:    3.26 cm/s LV PW:         1.00 cm     LV E/e' medial:  15.4 LV IVS:        1.50 cm     LV e' lateral:   6.09 cm/s LVOT diam:     1.90 cm     LV E/e' lateral: 8.2 LV SV:         48 LV SV Index:   26 LVOT Area:     2.84 cm  LV Volumes (MOD) LV vol d, MOD A4C: 79.8 ml LV vol s, MOD A4C: 51.2 ml LV SV MOD A4C:     79.8 ml RIGHT VENTRICLE RV S prime:     7.83 cm/s TAPSE (M-mode): 1.4 cm LEFT ATRIUM             Index       RIGHT ATRIUM           Index LA diam:        4.20 cm 2.24 cm/m  RA Area:     14.90 cm LA Vol (A2C):   80.3 ml 42.86 ml/m RA Volume:   35.60 ml  19.00 ml/m LA Vol (A4C):   75.4 ml 40.24 ml/m LA Biplane Vol: 81.9 ml 43.71 ml/m  AORTIC VALVE LVOT Vmax:   82.70 cm/s LVOT Vmean:  56.600 cm/s LVOT VTI:    0.170 m  AORTA Ao Root diam: 3.80 cm Ao Asc diam:  3.60 cm MV E velocity: 50.10 cm/s  TRICUSPID VALVE MV A velocity: 92.10 cm/s  TR Peak grad:   22.8 mmHg MV E/A ratio:  0.54        TR Vmax:        239.00 cm/s                             SHUNTS                            Systemic VTI:  0.17 m                            Systemic Diam: 1.90 cm Armanda Magic MD Electronically signed by Armanda Magic MD Signature Date/Time: 03/17/2021/4:59:06 PM    Final     Cardiac  Studies   None  Assessment   Principal Problem:   Near syncope Active Problems:   Hypertension   Cardiac pacemaker   Type 2 diabetes mellitus with hyperglycemia ([name])   Sick sinus syndrome ([name])   Orthostatic hypotension   Clotting disorder ([name])   Plan  Would continue current meds - orthostasis has resolved. Maintaining BP around 150 systolic is a reasonable target. Still having bigeminy - suppressing this will be difficult and given his age, not advised - I'm not sure that was the reason for his orthostasis/near syncope.  [facility] will sign off.   Medication Recommendations:  continue current meds Other recommendations (labs, testing, etc):  none Follow up as an outpatient:  Dr. Bjorn Pippin or APP   Time Spent Directly with Patient:  I have spent a total of 25 minutes with the patient reviewing hospital notes, telemetry, EKGs, labs and examining the patient as well as establishing an assessment and plan that was discussed personally with the patient.  > 50% of time was spent in direct patient care.  Length of Stay:  LOS: 1 day   Chrystie Nose, MD, [facility], FACP  [place]  [facility]  Medical Director of the [facility]  Card[facility]ovascular [facility] Diplomate of the American Board of Clinical Lipidology Attending Cardiologist  Direct Dial: [phone]  Fax: [phone]  Website:  [email or web address] Nicely Deloy Archey 03/18/2021, 9:29 AM

## 2021-03-18 NOTE — [place] FL2 (Signed)
[place] MEDICAID FL2 LEVEL OF CARE SCREENING TOOL     IDENTIFICATION  [name] Birthdate: May 14, 1929 Sex: male Admission Date (Current Location): 03/16/2021  [facility] and [place] Number:  Producer, television/film/video and Address:  [facility], [address], [place], [place] [address]      Provider Number: [id number]  Attending Physician Name and Address:  Hughie Closs, MD  Relative Name and Phone Number:  Renaldo Gornick (wife) [phone]    Current Level of Care: Hospital Recommended Level of Care: Skilled Nursing Facility Prior Approval Number:    Date Approved/Denied:   PASRR Number: [id number]  Discharge Plan: SNF    Current Diagnoses: Patient Active Problem List   Diagnosis Date Noted   Near syncope 03/16/2021   Clotting disorder ([name]) 03/16/2021   Orthostatic hypotension 10/01/2020   TIA (transient ischemic attack) 08/31/2020   Normocytic anemia 08/31/2020   Prolonged QT interval 08/31/2020   Acute CVA (cerebrovascular accident) ([name]) 08/31/2020   Debility 08/21/2020   Lytic bone lesion of femur 08/21/2020   Sick sinus syndrome ([name]) 07/31/2020   Coronary artery disease s/p CABG 2001 07/17/2020   Hypothyroidism 07/17/2020   Venous thromboembolism (VTE) 07/17/2020   Hypertension 07/17/2020   Cardiac pacemaker 07/17/2020   Osteoarthritis 07/17/2020   Type 2 diabetes mellitus with hyperglycemia ([name]) 07/17/2020   Vitamin D deficiency 07/17/2020   Macular degeneration 07/17/2020   Dyslipidemia due to type 2 diabetes mellitus ([name]) 07/17/2020   Insomnia 07/17/2020   BPH (benign prostatic hyperplasia) 07/17/2020   Colon polyps 07/17/2020    Orientation RESPIRATION BLADDER Height & Weight     Self, Time, Situation, Place  Normal External catheter, Continent Weight: 73.9 kg Height:  5\' 8"  (172.7 cm)  BEHAVIORAL SYMPTOMS/MOOD NEUROLOGICAL BOWEL NUTRITION STATUS      Continent Diet (See DC summary)  AMBULATORY  STATUS COMMUNICATION OF NEEDS Skin   Extensive Assist Verbally Normal                       Personal Care Assistance Level of Assistance  Bathing, Dressing, Feeding Bathing Assistance: Limited assistance Feeding assistance: Limited assistance (needs set up) Dressing Assistance: Limited assistance     Functional Limitations Info  Sight, Hearing, Speech Sight Info: Adequate Hearing Info: Adequate Speech Info: Adequate    SPECIAL CARE FACTORS FREQUENCY  PT (By licensed PT), OT (By licensed OT)     PT Frequency: 5x/week OT Frequency: 5x/week            Contractures Contractures Info: Not present    Additional Factors Info  Code Status, Allergies Code Status Info: Partial Allergies Info: Amitriptyline, Lyrica (Pregabalin), Pravachol (Pravastatin), Effexor Xr (Venlafaxine), Morphine, Zolpidem           Current Medications (03/18/2021):  This is the current hospital active medication list Current Facility-Administered Medications  Medication Dose Route Frequency Provider Last Rate Last Admin   acetaminophen (TYLENOL) tablet 650 mg  650 mg Oral Q6H PRN 03/20/2021, DO   650 mg at 03/17/21 1158   Or   acetaminophen (TYLENOL) suppository 650 mg  650 mg Rectal Q6H PRN 03/19/21, DO       apixaban Hillary Bow) tablet 5 mg  5 mg Oral BID Everlene Balls M, DO   5 mg at 03/18/21 0855   carvedilol (COREG) tablet 6.25 mg  6.25 mg Oral BID WC 03/20/21, MD   6.25 mg at 03/18/21 [id number]  ferrous sulfate tablet 325 mg  325 mg Oral Q breakfast Hillary Bow, DO   325 mg at 03/18/21 0855   insulin aspart (novoLOG) injection 0-15 Units  0-15 Units Subcutaneous TID WC Hillary Bow, DO   3 Units at 03/18/21 1148   insulin glargine-yfgn (SEMGLEE) injection 10 Units  10 Units Subcutaneous Daily Hillary Bow, DO   10 Units at 03/18/21 7654   levothyroxine (SYNTHROID) tablet 100 mcg  100 mcg Oral QAC breakfast Hillary Bow, DO   100 mcg at 03/18/21 0556    losartan (COZAAR) tablet 25 mg  25 mg Oral Daily Chrystie Nose, MD   25 mg at 03/18/21 0856   mirtazapine (REMERON) tablet 7.5 mg  7.5 mg Oral QHS Lyda Perone M, DO   7.5 mg at 03/17/21 2110   ondansetron (ZOFRAN) tablet 4 mg  4 mg Oral Q6H PRN Hillary Bow, DO       Or   ondansetron [facility]) injection 4 mg  4 mg Intravenous Q6H PRN Hillary Bow, DO       rosuvastatin (CRESTOR) tablet 20 mg  20 mg Oral QHS Lyda Perone M, DO   20 mg at 03/17/21 2110   sodium chloride flush (NS) 0.9 % injection 3 mL  3 mL Intravenous Q12H Hillary Bow, DO   3 mL at 03/17/21 2112     Discharge Medications: Please see discharge summary for a list of discharge medications.  Relevant Imaging Results:  Relevant Lab Results:   Additional Information [phone] has had merderna covid shot last in March  Ladona Ridgel, Hansel Starling, RN

## 2021-03-18 NOTE — TOC Initial Note (Addendum)
Transition of Care [facility]) - Initial/Assessment Note    Patient Details  Name: Thomas [name] MRN: [id number] Date of Birth: 1928-09-30  Transition of Care [facility]) CM/SW Contact:    Leone Haven, RN Phone Number: 03/18/2021, 3:15 PM  Clinical Narrative:                 Patient from home with wife , wife states she is home most of the time with patient, but they would like for him to go to a SNF.  They are active with [facility] for HHPT. He has a lift chair, w/chair and walker at home. And hadicapped toilet.  His son takes him to his MD apts, NCM asked if it is ok to fax information out to [facility], wife states yes to see what bed offers they receive.  NCM completed FL2 and faxed out to [facility] in [facility].    Expected Discharge Plan: Skilled Nursing Facility Barriers to Discharge: Continued Medical Work up   Patient Goals and CMS Choice Patient states their goals for this hospitalization and ongoing recovery are:: to go to SNF      Expected Discharge Plan and Services Expected Discharge Plan: Skilled Nursing Facility   Discharge Planning Services: CM Consult Post Acute Care Choice: Skilled Nursing Facility Living arrangements for the past 2 months: Single Family Home                   DME Agency: NA       HH Arranged: NA          Prior Living Arrangements/Services Living arrangements for the past 2 months: Single Family Home Lives with:: Spouse Patient language and need for interpreter reviewed:: Yes Do you feel safe going back to the place where you live?: Yes      Need for Family Participation in Patient Care: Yes (Comment) Care giver support system in place?: Yes (comment) Current home services: Home PT [facility] Care) Criminal Activity/Legal Involvement Pertinent to Current Situation/Hospitalization: No - Comment as needed  Activities of Daily Living Home Assistive Devices/Equipment: Cane (specify quad or straight), Bedside commode/3-in-1,  Walker (specify type) ADL Screening (condition at time of admission) Patient's cognitive ability adequate to safely complete daily activities?: Yes Is the patient deaf or have difficulty hearing?: Yes Does the patient have difficulty seeing, even when wearing glasses/contacts?: No Does the patient have difficulty concentrating, remembering, or making decisions?: No Patient able to express need for assistance with ADLs?: Yes Does the patient have difficulty dressing or bathing?: No Independently performs ADLs?: No Does the patient have difficulty walking or climbing stairs?: Yes Weakness of Legs: Both Weakness of Arms/Hands: Both  Permission Sought/Granted                  Emotional Assessment Appearance:: Appears stated age Attitude/Demeanor/Rapport: Engaged Affect (typically observed): Appropriate Orientation: : Oriented to Self, Oriented to Place, Oriented to  Time, Oriented to Situation Alcohol / Substance Use: Not Applicable Psych Involvement: No (comment)  Admission diagnosis:  Near syncope [R55] Patient Active Problem List   Diagnosis Date Noted   Near syncope 03/16/2021   Clotting disorder ([name]) 03/16/2021   Orthostatic hypotension 10/01/2020   TIA (transient ischemic attack) 08/31/2020   Normocytic anemia 08/31/2020   Prolonged QT interval 08/31/2020   Acute CVA (cerebrovascular accident) ([name]) 08/31/2020   Debility 08/21/2020   Lytic bone lesion of femur 08/21/2020   Sick sinus syndrome ([name]) 07/31/2020   Coronary artery disease s/p CABG 2001 07/17/2020  Hypothyroidism 07/17/2020   Venous thromboembolism (VTE) 07/17/2020   Hypertension 07/17/2020   Cardiac pacemaker 07/17/2020   Osteoarthritis 07/17/2020   Type 2 diabetes mellitus with hyperglycemia ([name]) 07/17/2020   Vitamin D deficiency 07/17/2020   Macular degeneration 07/17/2020   Dyslipidemia due to type 2 diabetes mellitus ([name]) 07/17/2020   Insomnia 07/17/2020   BPH (benign prostatic hyperplasia)  07/17/2020   Colon polyps 07/17/2020   PCP:  Ardith Dark, MD Pharmacy:   [facility] [id number] - [place], [place] - [address] [facility] [address] [place] [address] Phone: [phone] Fax: [phone]     Social Determinants of Health (SDOH) Interventions    Readmission Risk Interventions No flowsheet data found.

## 2021-03-18 NOTE — Plan of Care (Signed)

## 2021-03-18 NOTE — Evaluation (Signed)
Occupational Therapy Evaluation Patient Details Name: Thomas [name] MRN: [id number] DOB: 1929-06-03 Today's Date: 03/18/2021   History of Present Illness Pt is a 87 y/o male admitted 9/19 secondary to near syncope. Found to have ventricular bigeminy as well. PMH includes HTN, s/p pacemaker, DM, CAD s/p CABG, and CVA.   Clinical Impression   PTA, pt lives with spouse and reports able to ambulate with RW, toilet and dress self. Pt's spouse assists with setting up meals due to R carpal tunnel syndrome and shower transfers/tasks for safety. Pt endorses a hx of falls that are not always due to hypotension. Pt presents now below functional baseline, requiring up to Mod A for basic transfers and LB ADLs, including toileting during this session. Pt also limited due to impaired use of R dominant hand and new bouts of incontinence. Would recommend SNF prior to DC home to decrease fall risk and maximize independence with ADLs.    BP lying: 148/60 BP sitting: 139/68 BP after transfer: 126/74     Recommendations for follow up therapy are one component of a multi-disciplinary discharge planning process, led by the attending physician.  Recommendations may be updated based on patient status, additional functional criteria and insurance authorization.   Follow Up Recommendations  SNF;Supervision/Assistance - 24 hour    Equipment Recommendations  3 in 1 bedside commode    Recommendations for Other Services       Precautions / Restrictions Precautions Precautions: Fall;Other (comment) Precaution Comments: watch BP; urine incontinence Required Braces or Orthoses: Other Brace Other Brace: Wears soft knee brace on L knee at baseline Restrictions Weight Bearing Restrictions: No      Mobility Bed Mobility Overal bed mobility: Needs Assistance Bed Mobility: Supine to Sit;Sit to Supine     Supine to sit: Mod assist Sit to supine: Min assist   General bed mobility comments: Mod A to advance B  LE fully to EOB and lift trunk, Min A to get BLE back into bed    Transfers Overall transfer level: Needs assistance Equipment used: 1 person hand held assist;Rolling walker (2 wheeled) Transfers: Sit to/from [employer] Sit to Stand: Mod assist Stand pivot transfers: Min assist       General transfer comment: Mod A for initial sit to stand with hand held assist, transferred to [facility] via handheld assist due to urgency at MIn A to maintain balance and minor cues for sequencing. With armrests of BSC, pt able to stand more easily with RW and transfer to bed with Min A. Mod A for controlled descent into bed    Balance Overall balance assessment: Needs assistance Sitting-balance support: Feet supported;Bilateral upper extremity supported Sitting balance-Leahy Scale: Poor Sitting balance - Comments: reliant on UE support EOB   Standing balance support: Bilateral upper extremity supported Standing balance-Leahy Scale: Poor Standing balance comment: Reliant on UE support and/or external support                           ADL either performed or assessed with clinical judgement   ADL Overall ADL's : Needs assistance/impaired Eating/Feeding: Set up;Bed level Eating/Feeding Details (indicate cue type and reason): on entry, pt with difficulty opening milk, oatmeal container and butter packet for oatmeal Grooming: Minimal assistance;Sitting   Upper Body Bathing: Minimal assistance;Sitting   Lower Body Bathing: Moderate assistance;Sit to/from stand   Upper Body Dressing : Minimal assistance;Sitting   Lower Body Dressing: Moderate assistance Lower Body Dressing Details (indicate cue type  and reason): pt able to reach to socks to pull up sitting on BSC. After urine incontinence, OT assisted to doff socks Toilet Transfer: Moderate assistance;Stand-pivot;BSC;RW Toilet Transfer Details (indicate cue type and reason): pt initially wanted to ambulate to bathroom but  deferred due to reports of lightheadedness sitting EOB. Due to urgency, assisted pt to [facility] with handheld assist (Mod A to power up and Min A to maintain balance). Min A to get back from [address] to bed using RW. Toileting- Clothing Manipulation and Hygiene: Moderate assistance;Sit to/from stand;Sitting/lateral lean Toileting - Clothing Manipulation Details (indicate cue type and reason): pt able to assist with peri care siting on [facility] but with difficulty maintaining hygenic area. Assist for thoroughness after BM. Noted with urine incontinence (missed BSC bucket) and assist needed for cleanup       General ADL Comments: Pt with initial reports of lightheadedness that subsided with BP normal. Pt requiring increased assist to stand and maintain balance     Vision Patient Visual Report: No change from baseline Vision Assessment?: No apparent visual deficits     Perception     Praxis      Pertinent Vitals/Pain Pain Assessment: No/denies pain Pain Intervention(s): Monitored during session     Hand Dominance Right   Extremity/Trunk Assessment Upper Extremity Assessment Upper Extremity Assessment: RUE deficits/detail;LUE deficits/detail RUE Deficits / Details: carpal tunnel syndrome per pt. unable to make fist, difficulty opening containers and with small movements RUE Coordination: decreased fine motor LUE Deficits / Details: hx of carpal tunnel release. ROM WFL LUE Coordination: decreased fine motor   Lower Extremity Assessment Lower Extremity Assessment: Defer to PT evaluation   Cervical / Trunk Assessment Cervical / Trunk Assessment: Kyphotic   Communication Communication Communication: HOH   Cognition Arousal/Alertness: Awake/alert Behavior During Therapy: WFL for tasks assessed/performed Overall Cognitive Status: Within Functional Limits for tasks assessed                                 General Comments: WFL for basic tasks, likely at baseline. difficult to fully  assess due to [facility]. Some decreased safety awareness - wanted to walk to bathroom despite reports of feeling lightheaded with movement   General Comments  BP WFL    Exercises     Shoulder Instructions      Home Living Family/patient expects to be discharged to:: Private residence Living Arrangements: Spouse/significant other Available Help at Discharge: Family;Available 24 hours/day Type of Home: House Home Access: Level entry     Home Layout: One level     Bathroom Shower/Tub: Producer, television/film/video: Handicapped height     Home Equipment: Environmental consultant - 2 wheels;Shower seat;Transport chair          Prior Functioning/Environment Level of Independence: Needs assistance  Gait / Transfers Assistance Needed: Normally able to use RW for household distances. Uses transport chair for longer distances. ADL's / Homemaking Assistance Needed: Pt reports able to dress self, toilet self. Wife assists with shower transfers and bathing tasks for safety. due to carpal tunnel syndrome in R UE - unable to make fist so wife assists with cutting up food, opening containers at home            OT Problem List: Decreased strength;Decreased range of motion;Decreased activity tolerance;Impaired balance (sitting and/or standing);Decreased coordination;Impaired UE functional use      OT Treatment/Interventions: Self-care/ADL training;Therapeutic exercise;Energy conservation;DME and/or AE instruction;Therapeutic activities;Patient/family education;Balance training  OT Goals(Current goals can be found in the care plan section) Acute Rehab OT Goals Patient Stated Goal: BP issues resolve, be able to walk again OT Goal Formulation: With patient Time For Goal Achievement: 04/01/21 Potential to Achieve Goals: Good  OT Frequency: Min 2X/week   Barriers to D/C:            Co-evaluation              AM-PAC OT "6 Clicks" Daily Activity     Outcome Measure Help from another person  eating meals?: A Little Help from another person taking care of personal grooming?: A Little Help from another person toileting, which includes using toliet, bedpan, or urinal?: A Lot Help from another person bathing (including washing, rinsing, drying)?: A Lot Help from another person to put on and taking off regular upper body clothing?: A Little Help from another person to put on and taking off regular lower body clothing?: A Lot 6 Click Score: 15   End of Session Equipment Utilized During Treatment: Rolling walker Nurse Communication: Mobility status  Activity Tolerance: Patient tolerated treatment well;Patient limited by fatigue Patient left: in bed;with call bell/phone within reach;with bed alarm set  OT Visit Diagnosis: Unsteadiness on feet (R26.81);Other abnormalities of gait and mobility (R26.89);Muscle weakness (generalized) (M62.81);History of falling (Z91.81)                Time: 2671-2458 OT Time Calculation (min): 37 min Charges:  OT General Charges $OT Visit: 1 Visit OT Evaluation $OT Eval Moderate Complexity: 1 Mod OT Treatments $Self Care/Home Management : 8-22 mins  Bradd Canary, OTR/L Acute Rehab Services Office: [phone]   Lorre Munroe 03/18/2021, 8:49 AM

## 2021-03-19 ENCOUNTER — Other Ambulatory Visit: Payer: Self-pay | Admitting: Family Medicine

## 2021-03-19 ENCOUNTER — Encounter (HOSPITAL_COMMUNITY): Payer: Self-pay | Admitting: Internal Medicine

## 2021-03-19 DIAGNOSIS — R55 Syncope and collapse: Secondary | ICD-10-CM | POA: Diagnosis not present

## 2021-03-19 LAB — GLUCOSE, CAPILLARY
Glucose-Capillary: 159 mg/dL — ABNORMAL HIGH (ref 70–99)
Glucose-Capillary: 236 mg/dL — ABNORMAL HIGH (ref 70–99)
Glucose-Capillary: 156 mg/dL — ABNORMAL HIGH (ref 70–99)
Glucose-Capillary: 191 mg/dL — ABNORMAL HIGH (ref 70–99)

## 2021-03-19 MED ORDER — SODIUM CHLORIDE 0.9 % IV SOLN: INTRAVENOUS | Status: AC

## 2021-03-19 NOTE — Progress Notes (Signed)
PROGRESS NOTE    Thomas [name]  RKY:[id number] DOB: [birth year] DOA: 03/16/2021 PCP: Ardith Dark, MD   Brief Narrative:  Thomas [name] is a 87 y.o. male with medical history significant of SSS s/p PPM, CAD, CABG 2001, DM2, HLD, HTN, orthostatic hypotension, clotting disorder on eliquis, stroke in March   Presentedto ED with c/o weakness / near syncope today while working with PT.  Unable to stand up due to feeling too weak.  Heart monitor at home showed pulse in the upper 30s / lower 40s.  Upon arrival to ED, BP 151/41.  HR in 70s-80 on monitor, has new onset bigeminy which cards believes is the reason that pulse is running low (reading half of his actual HR).   PPM interrogation = no recent events.  Admitted under TRH.  Assessment & Plan:   Principal Problem:   Near syncope Active Problems:   Hypertension   Cardiac pacemaker   Type 2 diabetes mellitus with hyperglycemia ([name])   Sick sinus syndrome ([name])   Orthostatic hypotension   Clotting disorder ([name])   Near-syncope/bigeminy/orthostatic hypotension: Near syncope could very well be due to bigeminy as well as orthostatic hypotension.  Patient is still orthostatic positive.  Per cardiology, still having bigeminy but not having any symptoms.  They recommended continuing Coreg.  We will continue IV fluids for another 10 hours.  History of PE, clotting disorder: Continue Eliquis.  Essential hypertension: Fairly controlled.  Remains on losartan.  CAD s/p CABG: Continue Eliquis and statin.  Type 2 diabetes mellitus: Takes 15 units of Lantus at home.  Currently on 10 units here and SSI.  Blood sugar labile but mostly controlled.  Continue current regimen.  Hypothyroidism: Continue Synthroid.  Physical deconditioning/debility: Seen by PT OT.  They recommend SNF.  Wife agreeable.  TOC working on placement.  DVT prophylaxis:    Code Status: Partial Code  Family Communication:  None present at bedside.  Discussed with wife  yesterday  Remains inpatient appropriate because:Hemodynamically unstable  Dispo: The patient is from: Home              Anticipated d/c is to: Home              Patient currently is not medically stable to d/c.   Difficult to place patient No         Estimated body mass index is 24.72 kg/m as calculated from the following:   Height as of this encounter: 5\' 8"  (1.727 m).   Weight as of this encounter: 73.8 kg.     Nutritional Assessment: Body mass index is 24.72 kg/m. Seen by dietician.  I agree with the assessment and plan as outlined below: Nutrition Status:        .  Skin Assessment: I have examined the patient's skin and I agree with the wound assessment as performed by the wound care RN as outlined below:    Consultants:  Cardiology  Procedures:  None  Antimicrobials:  Anti-infectives (From admission, onward)    None          Subjective: Seen and examined.  He has no complaints.  Objective: Vitals:   03/19/21 0019 03/19/21 0513 03/19/21 0912 03/19/21 1110  BP:  (!) 164/77 (!) 142/71 (!) 108/49  Pulse:  67 71 69  Resp:  18 18 19   Temp:  98.5 F (36.9 C)  98 F (36.7 C)  TempSrc:    Oral  SpO2:  100% 98% 99%  Weight: 75.1 kg 73.8 kg  Height:        Intake/Output Summary (Last 24 hours) at 03/19/2021 1118 Last data filed at 03/19/2021 0924 Gross per 24 hour  Intake 537 ml  Output 950 ml  Net -413 ml    Filed Weights   03/16/21 1701 03/19/21 0019 03/19/21 0513  Weight: 73.9 kg 75.1 kg 73.8 kg    Examination:  General exam: Appears calm and comfortable  Respiratory system: Clear to auscultation. Respiratory effort normal. Cardiovascular system: S1 & S2 heard, RRR. No JVD, murmurs, rubs, gallops or clicks. No pedal edema. Gastrointestinal system: Abdomen is nondistended, soft and nontender. No organomegaly or masses felt. Normal bowel sounds heard. Central nervous system: Alert and oriented. No focal neurological  deficits. Extremities: Symmetric 5 x 5 power. Skin: No rashes, lesions or ulcers.  Psychiatry: Judgement and insight appear poor  Data Reviewed: I have personally reviewed following labs and imaging studies  CBC: Recent Labs  Lab 03/16/21 1721  WBC 8.9  NEUTROABS 4.8  HGB 11.8*  HCT 34.9*  MCV 97.2  PLT 156    Basic Metabolic Panel: Recent Labs  Lab 03/16/21 1721 03/17/21 0326 03/18/21 0322  NA 138 138 140  K 4.0 3.5 3.7  CL 105 105 107  CO2 23 23 23   GLUCOSE 273* 259* 97  BUN 19 18 13   CREATININE 1.16 1.04 0.95  CALCIUM 8.9 8.8* 8.5*  MG 1.8 1.9  --   PHOS 3.6  --   --     GFR: Estimated Creatinine Clearance: 50 mL/min (by C-G formula based on SCr of 0.95 mg/dL). Liver Function Tests: Recent Labs  Lab 03/16/21 1721  AST 29  ALT 22  ALKPHOS 47  BILITOT 0.2*  PROT 6.2*  ALBUMIN 3.3*    No results for input(s): LIPASE, AMYLASE in the last 168 hours. No results for input(s): AMMONIA in the last 168 hours. Coagulation Profile: No results for input(s): INR, PROTIME in the last 168 hours. Cardiac Enzymes: No results for input(s): CKTOTAL, CKMB, CKMBINDEX, TROPONINI in the last 168 hours. BNP (last 3 results) No results for input(s): PROBNP in the last 8760 hours. HbA1C: Recent Labs    03/16/21 2135  HGBA1C 7.4*    CBG: Recent Labs  Lab 03/18/21 1112 03/18/21 1623 03/18/21 2104 03/19/21 0552 03/19/21 1111  GLUCAP 178* 161* 174* 159* 236*    Lipid Profile: No results for input(s): CHOL, HDL, LDLCALC, TRIG, CHOLHDL, LDLDIRECT in the last 72 hours. Thyroid Function Tests: Recent Labs    03/16/21 2155  TSH 3.529    Anemia Panel: No results for input(s): VITAMINB12, FOLATE, FERRITIN, TIBC, IRON, RETICCTPCT in the last 72 hours. Sepsis Labs: No results for input(s): PROCALCITON, LATICACIDVEN in the last 168 hours.  Recent Results (from the past 240 hour(s))  Resp Panel by RT-PCR (Flu A&B, Covid)     Status: None   Collection Time:  03/17/21  2:26 PM  Result Value Ref Range Status   SARS Coronavirus 2 by RT PCR NEGATIVE NEGATIVE Final    Comment: (NOTE) SARS-CoV-2 target nucleic acids are NOT DETECTED.  The SARS-CoV-2 RNA is generally detectable in upper respiratory specimens during the acute phase of infection. The lowest concentration of SARS-CoV-2 viral copies this assay can detect is 138 copies/mL. A negative result does not preclude SARS-Cov-2 infection and should not be used as the sole basis for treatment or other patient management decisions. A negative result may occur with  improper specimen collection/handling, submission of specimen other than nasopharyngeal swab, presence of  viral mutation(s) within the areas targeted by this assay, and inadequate number of viral copies(<138 copies/mL). A negative result must be combined with clinical observations, patient history, and epidemiological information. The expected result is Negative.  Fact Sheet for Patients:  BloggerCourse.com  Fact Sheet for Healthcare Providers:  SeriousBroker.it  This test is no t yet approved or cleared by the [place] FDA and  has been authorized for detection and/or diagnosis of SARS-CoV-2 by FDA under an Emergency Use Authorization (EUA). This EUA will remain  in effect (meaning this test can be used) for the duration of the COVID-19 declaration under Section 564(b)(1) of the Act, 21 U.S.C.section 360bbb-3(b)(1), unless the authorization is terminated  or revoked sooner.       Influenza A by PCR NEGATIVE NEGATIVE Final   Influenza B by PCR NEGATIVE NEGATIVE Final    Comment: (NOTE) The Xpert Xpress SARS-CoV-2/FLU/RSV plus assay is intended as an aid in the diagnosis of influenza from Nasopharyngeal swab specimens and should not be used as a sole basis for treatment. Nasal washings and aspirates are unacceptable for Xpert Xpress SARS-CoV-2/FLU/RSV testing.  Fact  Sheet for Patients: BloggerCourse.com  Fact Sheet for Healthcare Providers: SeriousBroker.it  This test is not yet approved or cleared by the [place] FDA and has been authorized for detection and/or diagnosis of SARS-CoV-2 by FDA under an Emergency Use Authorization (EUA). This EUA will remain in effect (meaning this test can be used) for the duration of the COVID-19 declaration under Section 564(b)(1) of the Act, 21 U.S.C. section 360bbb-3(b)(1), unless the authorization is terminated or revoked.  Performed at [facility], [address] [place], [place] [address]       Radiology Studies: ECHOCARDIOGRAM COMPLETE  Result Date: 03/17/2021    ECHOCARDIOGRAM REPORT   Patient Name:   Thomas [name] Date of Exam: 03/17/2021 Medical Rec #:  [id number]       Height:       68.0 in Accession #:    [id number]      Weight:       163.0 lb Date of Birth:  1929/01/06       BSA:          1.874 m Patient Age:    89 years        BP:           162/61 mmHg Patient Gender: M               HR:           72 bpm. Exam Location:  Inpatient Procedure: 2D Echo Indications:    CAD of native vessel  History:        Patient has prior history of Echocardiogram examinations, most                 recent 08/07/2020. Prior CABG and Pacemaker; Risk                 Factors:Hypertension, Dyslipidemia and Diabetes.  Sonographer:    Delcie Roch RDCS Referring Phys: [id number] CHRISTOPHER L SCHUMANN IMPRESSIONS  1. Left ventricular ejection fraction, by estimation, is 60 to 65%. The left ventricle has normal function. The left ventricle has no regional wall motion abnormalities. There is moderate asymmetric left ventricular hypertrophy of the septal segment. Left ventricular diastolic parameters are consistent with Grade I diastolic dysfunction (impaired relaxation).  2. Right ventricular systolic function is normal. The right ventricular size is normal. There is normal  pulmonary artery systolic  pressure. The estimated right ventricular systolic pressure is 25.8 mmHg.  3. Left atrial size was moderately dilated.  4. The mitral valve is degenerative. Mild mitral valve regurgitation. No evidence of mitral stenosis.  5. The aortic valve is calcified. Aortic valve regurgitation is not visualized. Mild to moderate aortic valve sclerosis/calcification is present, without any evidence of aortic stenosis.  6. Aortic dilatation noted. There is mild dilatation of the aortic root, measuring 38 mm.  7. The inferior vena cava is normal in size with greater than 50% respiratory variability, suggesting right atrial pressure of 3 mmHg. FINDINGS  Left Ventricle: Left ventricular ejection fraction, by estimation, is 60 to 65%. The left ventricle has normal function. The left ventricle has no regional wall motion abnormalities. The left ventricular internal cavity size was normal in size. There is  moderate asymmetric left ventricular hypertrophy of the septal segment. Left ventricular diastolic parameters are consistent with Grade I diastolic dysfunction (impaired relaxation). Normal left ventricular filling pressure. Right Ventricle: The right ventricular size is normal. No increase in right ventricular wall thickness. Right ventricular systolic function is normal. There is normal pulmonary artery systolic pressure. The tricuspid regurgitant velocity is 2.39 m/s, and  with an assumed right atrial pressure of 3 mmHg, the estimated right ventricular systolic pressure is 25.8 mmHg. Left Atrium: Left atrial size was moderately dilated. Right Atrium: Right atrial size was normal in size. Pericardium: There is no evidence of pericardial effusion. Mitral Valve: The mitral valve is degenerative in appearance. There is mild thickening of the mitral valve leaflet(s). There is mild calcification of the mitral valve leaflet(s). Mild to moderate mitral annular calcification. Mild mitral valve regurgitation. No  evidence of mitral valve stenosis. Tricuspid Valve: The tricuspid valve is normal in structure. Tricuspid valve regurgitation is mild . No evidence of tricuspid stenosis. Aortic Valve: The aortic valve is calcified. Aortic valve regurgitation is not visualized. Mild to moderate aortic valve sclerosis/calcification is present, without any evidence of aortic stenosis. Pulmonic Valve: The pulmonic valve was normal in structure. Pulmonic valve regurgitation is not visualized. No evidence of pulmonic stenosis. Aorta: Aortic dilatation noted. There is mild dilatation of the aortic root, measuring 38 mm. Venous: The inferior vena cava is normal in size with greater than 50% respiratory variability, suggesting right atrial pressure of 3 mmHg. IAS/Shunts: No atrial level shunt detected by color flow Doppler. Additional Comments: A device lead is visualized.  LEFT VENTRICLE PLAX 2D LVIDd:         4.60 cm     Diastology LVIDs:         3.10 cm     LV e' medial:    3.26 cm/s LV PW:         1.00 cm     LV E/e' medial:  15.4 LV IVS:        1.50 cm     LV e' lateral:   6.09 cm/s LVOT diam:     1.90 cm     LV E/e' lateral: 8.2 LV SV:         48 LV SV Index:   26 LVOT Area:     2.84 cm  LV Volumes (MOD) LV vol d, MOD A4C: 79.8 ml LV vol s, MOD A4C: 51.2 ml LV SV MOD A4C:     79.8 ml RIGHT VENTRICLE RV S prime:     7.83 cm/s TAPSE (M-mode): 1.4 cm LEFT ATRIUM             Index  RIGHT ATRIUM           Index LA diam:        4.20 cm 2.24 cm/m  RA Area:     14.90 cm LA Vol (A2C):   80.3 ml 42.86 ml/m RA Volume:   35.60 ml  19.00 ml/m LA Vol (A4C):   75.4 ml 40.24 ml/m LA Biplane Vol: 81.9 ml 43.71 ml/m  AORTIC VALVE LVOT Vmax:   82.70 cm/s LVOT Vmean:  56.600 cm/s LVOT VTI:    0.170 m  AORTA Ao Root diam: 3.80 cm Ao Asc diam:  3.60 cm MV E velocity: 50.10 cm/s  TRICUSPID VALVE MV A velocity: 92.10 cm/s  TR Peak grad:   22.8 mmHg MV E/A ratio:  0.54        TR Vmax:        239.00 cm/s                             SHUNTS                             Systemic VTI:  0.17 m                            Systemic Diam: 1.90 cm Armanda Magic MD Electronically signed by Armanda Magic MD Signature Date/Time: 03/17/2021/4:59:06 PM    Final     Scheduled Meds:  apixaban  5 mg Oral BID   carvedilol  6.25 mg Oral BID WC   ferrous sulfate  325 mg Oral Q breakfast   insulin aspart  0-15 Units Subcutaneous TID WC   insulin glargine-yfgn  10 Units Subcutaneous Daily   levothyroxine  100 mcg Oral QAC breakfast   losartan  25 mg Oral Daily   mirtazapine  7.5 mg Oral QHS   rosuvastatin  20 mg Oral QHS   sodium chloride flush  3 mL Intravenous Q12H   Continuous Infusions:  sodium chloride 100 mL/hr at 03/19/21 0923     LOS: 2 days   Time spent: 27 minutes   Hughie Closs, MD [facility]  03/19/2021, 11:18 AM  Please page via Loretha Stapler and do not message via secure chat for anything urgent. Secure chat can be used for anything non urgent.  How to contact the [facility] Attending or Consulting provider 7A - 7P or covering provider during after hours 7P -7A, for this patient?  Check the care team in [facility] and look for a) attending/consulting TR[facility] provider listed and b) the [facility] team listed. Page or secure chat 7A-7P. Log into [email or web address] and use [place]'s universal password to access. If you do not have the password, please contact the hospital operator. Locate the [facility] provider you are looking for under [facility] and page to a number that you can be directly reached. If you still have difficulty reaching the provider, please page the [facility] (Director on Call) for the Hospitalists listed on amion for assistance.

## 2021-03-19 NOTE — Social Work (Signed)
SW spoke to pt's wife re SNF choice and she has deferred decision making to her son, Barbara Cower. Call placed to [facility] and bed offers provided, he has chosen [facility].   Notified Janie in [facility] admissions who confirmed they have a bed available tomorrow pending auth. [employer] auth started ref #[id number].   SW will provide updates as available.   Dellie Burns, MSW, LCSW [phone] (coverage)

## 2021-03-20 DIAGNOSIS — I10 Essential (primary) hypertension: Secondary | ICD-10-CM

## 2021-03-20 DIAGNOSIS — Z950 Presence of cardiac pacemaker: Secondary | ICD-10-CM | POA: Diagnosis not present

## 2021-03-20 DIAGNOSIS — I951 Orthostatic hypotension: Secondary | ICD-10-CM | POA: Diagnosis not present

## 2021-03-20 DIAGNOSIS — R55 Syncope and collapse: Secondary | ICD-10-CM | POA: Diagnosis not present

## 2021-03-20 LAB — GLUCOSE, CAPILLARY
Glucose-Capillary: 175 mg/dL — ABNORMAL HIGH (ref 70–99)
Glucose-Capillary: 172 mg/dL — ABNORMAL HIGH (ref 70–99)
Glucose-Capillary: 184 mg/dL — ABNORMAL HIGH (ref 70–99)
Glucose-Capillary: 186 mg/dL — ABNORMAL HIGH (ref 70–99)

## 2021-03-20 MED ORDER — CARVEDILOL 3.125 MG PO TABS
3.1250 mg | ORAL_TABLET | Freq: Two times a day (BID) | ORAL | Status: DC
  Administered 2021-03-20 – 2021-03-23 (×6): 3.125 mg via ORAL
  Filled 2021-03-20 (×6): qty 1

## 2021-03-20 MED ORDER — MIDODRINE HCL 5 MG PO TABS
2.5000 mg | ORAL_TABLET | Freq: Three times a day (TID) | ORAL | Status: DC
  Administered 2021-03-20 – 2021-03-23 (×10): 2.5 mg via ORAL
  Filled 2021-03-20 (×10): qty 1

## 2021-03-20 NOTE — Care Management Important Message (Signed)
Important Message  Patient Details  Name: Thomas [name] MRN: [id number] Date of Birth: [birth year]   Medicare Important Message Given:  Yes     Cezar Misiaszek Stefan Church 03/20/2021, 3:39 PM

## 2021-03-20 NOTE — Progress Notes (Signed)
   03/20/21 1625  Orthostatic Lying   BP- Lying 168/67  Pulse- Lying 71  Orthostatic Sitting  BP- Sitting (!) 172/92  Pulse- Sitting 77  Orthostatic Standing at 0 minutes  BP- Standing at 0 minutes 155/64  Pulse- Standing at 0 minutes 81  Orthostatic Standing at 3 minutes  BP- Standing at 3 minutes 178/44  Pulse- Standing at 3 minutes 81

## 2021-03-20 NOTE — Progress Notes (Signed)
PROGRESS NOTE    Thomas [name]  CZY:[id number] DOB: 02/07/1929 DOA: 03/16/2021 PCP: Ardith Dark, MD   Brief Narrative:  Thomas [name] is a 87 y.o. male with medical history significant of SSS s/p PPM, CAD, CABG 2001, DM2, HLD, HTN, orthostatic hypotension, clotting disorder on eliquis, stroke in March   Presentedto ED with c/o weakness / near syncope today while working with PT.  Unable to stand up due to feeling too weak.  Heart monitor at home showed pulse in the upper 30s / lower 40s.  Upon arrival to ED, BP 151/41.  HR in 70s-80 on monitor, has new onset bigeminy which cards believes is the reason that pulse is running low (reading half of his actual HR).   PPM interrogation = no recent events.  Admitted under TRH.  Further hospitalization course as below.  Assessment & Plan:   Principal Problem:   Near syncope Active Problems:   Hypertension   Cardiac pacemaker   Type 2 diabetes mellitus with hyperglycemia ([name])   Sick sinus syndrome ([name])   Orthostatic hypotension   Clotting disorder ([name])   Near-syncope/bigeminy/orthostatic hypotension: Near syncope could very well be due to bigeminy as well as orthostatic hypotension.  Patient is still orthostatic positive.  Per recent orthostatic vitals, his systolic blood pressure dropped from 144 lying to 84 standing after 3 minutes.  Unable to find out whether patient was symptomatic.  Discussed with Dr. Rennis Golden, medication adjustments done.  Coreg reduced to 3.125 twice daily, losartan discontinued and midodrine added.  Continue checking orthostatics every shift.  With this orthostatic, patient will likely not be able to participate in therapy and should be discharged once this issue is solved.  Insurance authorization is a still pending.  History of PE, clotting disorder: Continue Eliquis.  Essential hypertension: Fairly controlled.  Remains on losartan.  CAD s/p CABG: Continue Eliquis and statin.  Type 2 diabetes mellitus:  Takes 15 units of Lantus at home.  Currently on 10 units here and SSI.  Blood sugar labile but mostly controlled.  Continue current regimen.  Hypothyroidism: Continue Synthroid.  Physical deconditioning/debility: Seen by PT OT.  They recommend SNF.  Wife agreeable.  TOC working on placement.  DVT prophylaxis:    Code Status: Partial Code  Family Communication:  None present at bedside.  Discussed with wife over the phone.  Remains inpatient appropriate because:Hemodynamically unstable  Dispo: The patient is from: Home              Anticipated d/c is to: Home              Patient currently is not medically stable to d/c.   Difficult to place patient No         Estimated body mass index is 25.09 kg/m as calculated from the following:   Height as of this encounter: 5\' 8"  (1.727 m).   Weight as of this encounter: 74.8 kg.     Nutritional Assessment: Body mass index is 25.09 kg/m. Seen by dietician.  I agree with the assessment and plan as outlined below: Nutrition Status:        .  Skin Assessment: I have examined the patient's skin and I agree with the wound assessment as performed by the wound care RN as outlined below:    Consultants:  Cardiology  Procedures:  None  Antimicrobials:  Anti-infectives (From admission, onward)    None          Subjective: Seen and examined.  Present.  No complaints  Objective: Vitals:   03/19/21 2027 03/20/21 0455 03/20/21 0459 03/20/21 0816  BP: (!) 158/67 (!) 144/80  132/62  Pulse: 73 75  (!) 109  Resp: 20 20  18   Temp: 98.8 F (37.1 C) 98.6 F (37 C)    TempSrc: Oral Oral    SpO2: 99% 97%    Weight:   74.8 kg   Height:        Intake/Output Summary (Last 24 hours) at 03/20/2021 1026 Last data filed at 03/20/2021 0900 Gross per 24 hour  Intake 1517.63 ml  Output 1301 ml  Net 216.63 ml    Filed Weights   03/19/21 0019 03/19/21 0513 03/20/21 0459  Weight: 75.1 kg 73.8 kg 74.8 kg     Examination:  General exam: Appears calm and comfortable  Respiratory system: Clear to auscultation. Respiratory effort normal. Cardiovascular system: S1 & S2 heard, RRR. No JVD, murmurs, rubs, gallops or clicks. No pedal edema. Gastrointestinal system: Abdomen is nondistended, soft and nontender. No organomegaly or masses felt. Normal bowel sounds heard. Central nervous system: Alert and oriented. No focal neurological deficits. Extremities: Symmetric 5 x 5 power. Skin: No rashes, lesions or ulcers.  Psychiatry: Judgement and insight appear normal. Mood & affect appropriate.    Data Reviewed: I have personally reviewed following labs and imaging studies  CBC: Recent Labs  Lab 03/16/21 1721  WBC 8.9  NEUTROABS 4.8  HGB 11.8*  HCT 34.9*  MCV 97.2  PLT 156    Basic Metabolic Panel: Recent Labs  Lab 03/16/21 1721 03/17/21 0326 03/18/21 0322  NA 138 138 140  K 4.0 3.5 3.7  CL 105 105 107  CO2 23 23 23   GLUCOSE 273* 259* 97  BUN 19 18 13   CREATININE 1.16 1.04 0.95  CALCIUM 8.9 8.8* 8.5*  MG 1.8 1.9  --   PHOS 3.6  --   --     GFR: Estimated Creatinine Clearance: 50 mL/min (by C-G formula based on SCr of 0.95 mg/dL). Liver Function Tests: Recent Labs  Lab 03/16/21 1721  AST 29  ALT 22  ALKPHOS 47  BILITOT 0.2*  PROT 6.2*  ALBUMIN 3.3*    No results for input(s): LIPASE, AMYLASE in the last 168 hours. No results for input(s): AMMONIA in the last 168 hours. Coagulation Profile: No results for input(s): INR, PROTIME in the last 168 hours. Cardiac Enzymes: No results for input(s): CKTOTAL, CKMB, CKMBINDEX, TROPONINI in the last 168 hours. BNP (last 3 results) No results for input(s): PROBNP in the last 8760 hours. HbA1C: No results for input(s): HGBA1C in the last 72 hours.  CBG: Recent Labs  Lab 03/19/21 0552 03/19/21 1111 03/19/21 1612 03/19/21 2054 03/20/21 0618  GLUCAP 159* 236* 156* 191* 175*    Lipid Profile: No results for input(s):  CHOL, HDL, LDLCALC, TRIG, CHOLHDL, LDLDIRECT in the last 72 hours. Thyroid Function Tests: No results for input(s): TSH, T4TOTAL, FREET4, T3FREE, THYROIDAB in the last 72 hours.  Anemia Panel: No results for input(s): VITAMINB12, FOLATE, FERRITIN, TIBC, IRON, RETICCTPCT in the last 72 hours. Sepsis Labs: No results for input(s): PROCALCITON, LATICACIDVEN in the last 168 hours.  Recent Results (from the past 240 hour(s))  Resp Panel by RT-PCR (Flu A&B, Covid)     Status: None   Collection Time: 03/17/21  2:26 PM  Result Value Ref Range Status   SARS Coronavirus 2 by RT PCR NEGATIVE NEGATIVE Final    Comment: (NOTE) SARS-CoV-2 target nucleic acids are NOT DETECTED.  The SARS-CoV-2 RNA is generally detectable in upper respiratory specimens during the acute phase of infection. The lowest concentration of SARS-CoV-2 viral copies this assay can detect is 138 copies/mL. A negative result does not preclude SARS-Cov-2 infection and should not be used as the sole basis for treatment or other patient management decisions. A negative result may occur with  improper specimen collection/handling, submission of specimen other than nasopharyngeal swab, presence of viral mutation(s) within the areas targeted by this assay, and inadequate number of viral copies(<138 copies/mL). A negative result must be combined with clinical observations, patient history, and epidemiological information. The expected result is Negative.  Fact Sheet for Patients:  BloggerCourse.com  Fact Sheet for Healthcare Providers:  SeriousBroker.it  This test is no t yet approved or cleared by the [place] FDA and  has been authorized for detection and/or diagnosis of SARS-CoV-2 by FDA under an Emergency Use Authorization (EUA). This EUA will remain  in effect (meaning this test can be used) for the duration of the COVID-19 declaration under Section 564(b)(1) of the  Act, 21 U.S.C.section 360bbb-3(b)(1), unless the authorization is terminated  or revoked sooner.       Influenza A by PCR NEGATIVE NEGATIVE Final   Influenza B by PCR NEGATIVE NEGATIVE Final    Comment: (NOTE) The Xpert Xpress SARS-CoV-2/FLU/RSV plus assay is intended as an aid in the diagnosis of influenza from Nasopharyngeal swab specimens and should not be used as a sole basis for treatment. Nasal washings and aspirates are unacceptable for Xpert Xpress SARS-CoV-2/FLU/RSV testing.  Fact Sheet for Patients: BloggerCourse.com  Fact Sheet for Healthcare Providers: SeriousBroker.it  This test is not yet approved or cleared by the [place] FDA and has been authorized for detection and/or diagnosis of SARS-CoV-2 by FDA under an Emergency Use Authorization (EUA). This EUA will remain in effect (meaning this test can be used) for the duration of the COVID-19 declaration under Section 564(b)(1) of the Act, 21 U.S.C. section 360bbb-3(b)(1), unless the authorization is terminated or revoked.  Performed at [facility], [address], [place], [place] [address]       Radiology Studies: No results found.  Scheduled Meds:  apixaban  5 mg Oral BID   carvedilol  3.125 mg Oral BID WC   ferrous sulfate  325 mg Oral Q breakfast   insulin aspart  0-15 Units Subcutaneous TID WC   insulin glargine-yfgn  10 Units Subcutaneous Daily   levothyroxine  100 mcg Oral QAC breakfast   midodrine  2.5 mg Oral TID WC   mirtazapine  7.5 mg Oral QHS   rosuvastatin  20 mg Oral QHS   sodium chloride flush  3 mL Intravenous Q12H   Continuous Infusions:     LOS: 3 days   Time spent: 26 minutes   Hughie Closs, MD [facility]  03/20/2021, 10:26 AM  Please page via Amion and do not message via secure chat for anything urgent. Secure chat can be used for anything non urgent.  How to contact the [facility] Attending or Consulting  provider 7A - 7P or covering provider during after hours 7P -7A, for this patient?  Check the care team in [facility] and look for a) attending/consulting [facility] provider listed and b) the [facility] team listed. Page or secure chat 7A-7P. Log into [email or web address] and use [place]'s universal password to access. If you do not have the password, please contact the hospital operator. Locate the [facility] provider you are looking for under [facility]  and page to a number that you can be directly reached. If you still have difficulty reaching the provider, please page the [facility] (Director on Call) for the Hospitalists listed on amion for assistance.

## 2021-03-20 NOTE — Progress Notes (Signed)
DAILY PROGRESS NOTE   [name] Date of Encounter: 03/20/2021 Cardiologist: Little Ishikawa, MD  Chief Complaint   Orthostasis  Patient Profile   Thomas [name] is a 87 y.o. male with a hx of CAD status post CABG in 2001, sick sinus syndrome status post PPM, pulmonary embolism, CVA, hypertension, T2DM, hyperlipidemia, orthostatic hypotension, hypothyroidism who is being seen 03/16/2021 for the evaluation of weakness/bradycardia at the request of Dr Madilyn Fireman.  Subjective   Asked to see Mr. Howton again for persistent orthostatic hypotension.. I had suggested some medications adjustments, including stopping tamsulosin and reduction in BB - he has struggled with hypertension at times as well, therefore a labile blood pressure. Per the hospitalist, there was a 60 mmHg systolic drop with standing - not clear if he was dizzy with this. This could be an issue with rehab. Review of BP's over the past 24 hours show the highest at 164 and lowest at 108. HR has improved. Still having PVC's.  Objective   Vitals:   03/19/21 2027 03/20/21 0455 03/20/21 0459 03/20/21 0816  BP: (!) 158/67 (!) 144/80  132/62  Pulse: 73 75  (!) 109  Resp: 20 20  18   Temp: 98.8 F (37.1 C) 98.6 F (37 C)    TempSrc: Oral Oral    SpO2: 99% 97%    Weight:   74.8 kg   Height:        Intake/Output Summary (Last 24 hours) at 03/20/2021 0853 Last data filed at 03/20/2021 0645 Gross per 24 hour  Intake 1400.63 ml  Output 1301 ml  Net 99.63 ml   Filed Weights   03/19/21 0019 03/19/21 0513 03/20/21 0459  Weight: 75.1 kg 73.8 kg 74.8 kg    Physical Exam   General appearance: alert, appears stated age, and no distress Neck: no carotid bruit, no JVD, and thyroid not enlarged, symmetric, no tenderness/mass/nodules Lungs: clear to auscultation bilaterally Heart: regular rate and rhythm, S1, S2 normal, and pacemaker noted Abdomen: soft, non-tender; bowel sounds normal; no masses,  no  organomegaly and scaphoid Extremities: extremities normal, atraumatic, no cyanosis or edema Pulses: 2+ and symmetric Skin: Skin color, texture, turgor normal. No rashes or lesions Neurologic: Grossly normal Psych: Pleasant  Inpatient Medications    Scheduled Meds:  apixaban  5 mg Oral BID   carvedilol  6.25 mg Oral BID WC   ferrous sulfate  325 mg Oral Q breakfast   insulin aspart  0-15 Units Subcutaneous TID WC   insulin glargine-yfgn  10 Units Subcutaneous Daily   levothyroxine  100 mcg Oral QAC breakfast   losartan  25 mg Oral Daily   mirtazapine  7.5 mg Oral QHS   rosuvastatin  20 mg Oral QHS   sodium chloride flush  3 mL Intravenous Q12H    Continuous Infusions:    PRN Meds: acetaminophen **OR** acetaminophen, ondansetron **OR** ondansetron (ZOFRAN) IV   Labs   Results for orders placed or performed during the hospital encounter of 03/16/21 (from the past 48 hour(s))  Glucose, capillary     Status: Abnormal   Collection Time: 03/18/21 11:12 AM  Result Value Ref Range   Glucose-Capillary 178 (H) 70 - 99 mg/dL    Comment: Glucose reference range applies only to samples taken after fasting for at least 8 hours.  Glucose, capillary     Status: Abnormal   Collection Time: 03/18/21  4:23 PM  Result Value Ref Range   Glucose-Capillary 161 (H) 70 - 99 mg/dL  Comment: Glucose reference range applies only to samples taken after fasting for at least 8 hours.  Glucose, capillary     Status: Abnormal   Collection Time: 03/18/21  9:04 PM  Result Value Ref Range   Glucose-Capillary 174 (H) 70 - 99 mg/dL    Comment: Glucose reference range applies only to samples taken after fasting for at least 8 hours.   Comment 1 Notify RN    Comment 2 Document in Chart   Glucose, capillary     Status: Abnormal   Collection Time: 03/19/21  5:52 AM  Result Value Ref Range   Glucose-Capillary 159 (H) 70 - 99 mg/dL    Comment: Glucose reference range applies only to samples taken after  fasting for at least 8 hours.   Comment 1 Notify RN    Comment 2 Document in Chart   Glucose, capillary     Status: Abnormal   Collection Time: 03/19/21 11:11 AM  Result Value Ref Range   Glucose-Capillary 236 (H) 70 - 99 mg/dL    Comment: Glucose reference range applies only to samples taken after fasting for at least 8 hours.  Glucose, capillary     Status: Abnormal   Collection Time: 03/19/21  4:12 PM  Result Value Ref Range   Glucose-Capillary 156 (H) 70 - 99 mg/dL    Comment: Glucose reference range applies only to samples taken after fasting for at least 8 hours.  Glucose, capillary     Status: Abnormal   Collection Time: 03/19/21  8:54 PM  Result Value Ref Range   Glucose-Capillary 191 (H) 70 - 99 mg/dL    Comment: Glucose reference range applies only to samples taken after fasting for at least 8 hours.  Glucose, capillary     Status: Abnormal   Collection Time: 03/20/21  6:18 AM  Result Value Ref Range   Glucose-Capillary 175 (H) 70 - 99 mg/dL    Comment: Glucose reference range applies only to samples taken after fasting for at least 8 hours.    ECG   N/A  Telemetry   Sinus rhythm - Personally Reviewed  Radiology    No results found.  Cardiac Studies   None  Assessment   Principal Problem:   Near syncope Active Problems:   Hypertension   Cardiac pacemaker   Type 2 diabetes mellitus with hyperglycemia ([name])   Sick sinus syndrome ([name])   Orthostatic hypotension   Clotting disorder ([name])   Plan   Continues to have issues with orthostasis. Will d/c losartan, decrease coreg to 3.125 mg BID - he is still having frequent PVC's in bigeminy with underlying pacing. Hopefully less BB will allow more pacing. Also, start low dose midodrine 2.5 mg TID - monitor for the possible development of significant hypertension.  Time Spent Directly with Patient:  I have spent a total of 25 minutes with the patient reviewing hospital notes, telemetry, EKGs, labs and  examining the patient as well as establishing an assessment and plan that was discussed personally with the patient.  > 50% of time was spent in direct patient care.  Length of Stay:  LOS: 3 days   Chrystie Nose, MD, [facility], FACP    [facility]  Medical Director of the [facility]  [facility] Diplomate of the American Board of Clinical Lipidology Attending Cardiologist  Direct Dial: [phone]  Fax: [phone]  Website:  [email or web address] 03/20/2021, 8:53 AM

## 2021-03-21 DIAGNOSIS — I498 Other specified cardiac arrhythmias: Secondary | ICD-10-CM

## 2021-03-21 DIAGNOSIS — I2782 Chronic pulmonary embolism: Secondary | ICD-10-CM

## 2021-03-21 DIAGNOSIS — I493 Ventricular premature depolarization: Secondary | ICD-10-CM

## 2021-03-21 DIAGNOSIS — R55 Syncope and collapse: Secondary | ICD-10-CM | POA: Diagnosis not present

## 2021-03-21 DIAGNOSIS — D689 Coagulation defect, unspecified: Secondary | ICD-10-CM | POA: Diagnosis not present

## 2021-03-21 DIAGNOSIS — Z950 Presence of cardiac pacemaker: Secondary | ICD-10-CM

## 2021-03-21 DIAGNOSIS — R5381 Other malaise: Secondary | ICD-10-CM

## 2021-03-21 DIAGNOSIS — M1712 Unilateral primary osteoarthritis, left knee: Secondary | ICD-10-CM

## 2021-03-21 LAB — GLUCOSE, CAPILLARY
Glucose-Capillary: 165 mg/dL — ABNORMAL HIGH (ref 70–99)
Glucose-Capillary: 239 mg/dL — ABNORMAL HIGH (ref 70–99)
Glucose-Capillary: 182 mg/dL — ABNORMAL HIGH (ref 70–99)
Glucose-Capillary: 225 mg/dL — ABNORMAL HIGH (ref 70–99)

## 2021-03-21 MED ORDER — INSULIN ASPART 100 UNIT/ML IJ SOLN
0.0000 [IU] | Freq: Three times a day (TID) | INTRAMUSCULAR | Status: DC
  Administered 2021-03-21 – 2021-03-22 (×2): 3 [IU] via SUBCUTANEOUS
  Administered 2021-03-22 (×2): 2 [IU] via SUBCUTANEOUS
  Administered 2021-03-23: 3 [IU] via SUBCUTANEOUS
  Administered 2021-03-23: 2 [IU] via SUBCUTANEOUS

## 2021-03-21 MED ORDER — INSULIN ASPART 100 UNIT/ML IJ SOLN
0.0000 [IU] | Freq: Every day | INTRAMUSCULAR | Status: DC
  Administered 2021-03-21: 2 [IU] via SUBCUTANEOUS

## 2021-03-21 MED ORDER — INSULIN ASPART 100 UNIT/ML IJ SOLN
3.0000 [IU] | Freq: Three times a day (TID) | INTRAMUSCULAR | Status: DC
  Administered 2021-03-21 – 2021-03-23 (×5): 3 [IU] via SUBCUTANEOUS

## 2021-03-21 MED ORDER — DICLOFENAC SODIUM 1 % EX GEL
2.0000 g | Freq: Four times a day (QID) | CUTANEOUS | Status: DC
  Administered 2021-03-21 – 2021-03-23 (×8): 2 g via TOPICAL
  Filled 2021-03-21: qty 100

## 2021-03-21 NOTE — Progress Notes (Addendum)
DAILY PROGRESS NOTE   [name] Date of Encounter: 03/21/2021 Cardiologist: Little Ishikawa, MD  Chief Complaint   Orthostasis  Patient Profile   Thomas [name] is a 87 y.o. male with a hx of CAD status post CABG in 2001, sick sinus syndrome status post PPM, pulmonary embolism, CVA, hypertension, T2DM, hyperlipidemia, orthostatic hypotension, hypothyroidism who is being seen 03/16/2021 for the evaluation of weakness/bradycardia at the request of Dr Madilyn Fireman.  Subjective   Sleepy no complaints   Objective   Vitals:   03/20/21 0816 03/20/21 1121 03/20/21 1942 03/21/21 0549  BP: 132/62 (!) 145/50 (!) 140/49 (!) 147/86  Pulse: (!) 109 71 64 61  Resp: 18 18 18 17   Temp:  98.5 F (36.9 C) 98.7 F (37.1 C) 99 F (37.2 C)  TempSrc:  Oral Oral Oral  SpO2:  96% 98%   Weight:    80.2 kg  Height:        Intake/Output Summary (Last 24 hours) at 03/21/2021 0800 Last data filed at 03/21/2021 03/23/2021 Gross per 24 hour  Intake 360 ml  Output 900 ml  Net -540 ml   Filed Weights   03/19/21 0513 03/20/21 0459 03/21/21 0549  Weight: 73.8 kg 74.8 kg 80.2 kg    Physical Exam   Affect appropriate Chronically ill elderly male  HEENT: normal Neck supple with no adenopathy JVP normal no bruits no thyromegaly Lungs clear with no wheezing and good diaphragmatic motion Heart:  S1/S2 no murmur, no rub, gallop or click PMI normal  PPM under left clavicle  Abdomen: benighn, BS positve, no tenderness, no AAA no bruit.  No HSM or HJR Distal pulses intact with no bruits No edema Neuro non-focal Skin warm and dry No muscular weakness   Inpatient Medications    Scheduled Meds:  apixaban  5 mg Oral BID   carvedilol  3.125 mg Oral BID WC   ferrous sulfate  325 mg Oral Q breakfast   insulin aspart  0-15 Units Subcutaneous TID WC   insulin glargine-yfgn  10 Units Subcutaneous Daily   levothyroxine  100 mcg Oral QAC breakfast   midodrine  2.5 mg Oral TID WC    mirtazapine  7.5 mg Oral QHS   rosuvastatin  20 mg Oral QHS   sodium chloride flush  3 mL Intravenous Q12H    Continuous Infusions:    PRN Meds: acetaminophen **OR** acetaminophen, ondansetron **OR** ondansetron (ZOFRAN) IV   Labs   Results for orders placed or performed during the hospital encounter of 03/16/21 (from the past 48 hour(s))  Glucose, capillary     Status: Abnormal   Collection Time: 03/19/21 11:11 AM  Result Value Ref Range   Glucose-Capillary 236 (H) 70 - 99 mg/dL    Comment: Glucose reference range applies only to samples taken after fasting for at least 8 hours.  Glucose, capillary     Status: Abnormal   Collection Time: 03/19/21  4:12 PM  Result Value Ref Range   Glucose-Capillary 156 (H) 70 - 99 mg/dL    Comment: Glucose reference range applies only to samples taken after fasting for at least 8 hours.  Glucose, capillary     Status: Abnormal   Collection Time: 03/19/21  8:54 PM  Result Value Ref Range   Glucose-Capillary 191 (H) 70 - 99 mg/dL    Comment: Glucose reference range applies only to samples taken after fasting for at least 8 hours.  Glucose, capillary     Status: Abnormal  Collection Time: 03/20/21  6:18 AM  Result Value Ref Range   Glucose-Capillary 175 (H) 70 - 99 mg/dL    Comment: Glucose reference range applies only to samples taken after fasting for at least 8 hours.  Glucose, capillary     Status: Abnormal   Collection Time: 03/20/21 11:19 AM  Result Value Ref Range   Glucose-Capillary 172 (H) 70 - 99 mg/dL    Comment: Glucose reference range applies only to samples taken after fasting for at least 8 hours.  Glucose, capillary     Status: Abnormal   Collection Time: 03/20/21  4:23 PM  Result Value Ref Range   Glucose-Capillary 184 (H) 70 - 99 mg/dL    Comment: Glucose reference range applies only to samples taken after fasting for at least 8 hours.  Glucose, capillary     Status: Abnormal   Collection Time: 03/20/21  9:14 PM  Result  Value Ref Range   Glucose-Capillary 186 (H) 70 - 99 mg/dL    Comment: Glucose reference range applies only to samples taken after fasting for at least 8 hours.   Comment 1 Notify RN    Comment 2 Document in Chart   Glucose, capillary     Status: Abnormal   Collection Time: 03/21/21  6:01 AM  Result Value Ref Range   Glucose-Capillary 165 (H) 70 - 99 mg/dL    Comment: Glucose reference range applies only to samples taken after fasting for at least 8 hours.   Comment 1 Notify RN    Comment 2 Document in Chart     ECG   Pacing with bigeminy   Telemetry   Bigeminy and pacing   Radiology    No results found.  Cardiac Studies   None  Assessment   Principal Problem:   Near syncope Active Problems:   Hypertension   Cardiac pacemaker   Type 2 diabetes mellitus with hyperglycemia ([name])   Sick sinus syndrome ([name])   Orthostatic hypotension   Clotting disorder ([name])   Plan   Continues to have issues with orthostasis.  Coreg decreased ARB d/c Telemetry with bigeminny with pacing will would be nice to suppress ectopy but QT prolonged and amiodarone not ideal Will ask EP to see for alternative AAT or pacer reprogramming  Low dose midodrone also started No severe HTN    Time Spent Directly with Patient:  I have spent a total of 25 minutes with the patient reviewing hospital notes, telemetry, EKGs, labs and examining the patient as well as establishing an assessment and plan that was discussed personally with the patient.  > 50% of time was spent in direct patient care.   Thomas [name] 03/21/2021, 8:00 AM  Patient ID: Thomas [name], male   DOB: [birth year], 87 y.o.   MRN: [id number]

## 2021-03-21 NOTE — Consult Note (Signed)
Electrophysiology Consultation:   Patient ID: Thomas [name] MRN: [id number]; DOB: 05/20/1929  Admit date: 03/16/2021 Date of Consult: 03/21/2021  PCP:  Ardith Dark, MD   [facility] Providers Cardiologist:  Little Ishikawa, MD     Patient Profile:   Thomas [name] is a 87 y.o. male with a hx of CAD post CABG in 2001, SSS s/p PPM, PE, CVA, HTN, DM, HLD, hypothyroidism who is being seen 03/21/2021 for the evaluation of bigeminy at the request of Dr Eden Emms.  History of Present Illness:   Thomas [name] presented 9/19 with weakness and bradycardia. He was initially found to have pulses in the 30s and 40s.  Team has been hesitant to start AAD given concern about his QTc. Since arriving, patient with symptomatic orthostatic intolerance which has further limited medication use.   Past Medical History:  Diagnosis Date   Arthritis    Clotting disorder ([name])    Coronary artery disease    Diabetes ([name])    Hyperlipidemia    Hypertension    Hypothyroidism 07/17/2020   Kidney disease    Loss of hearing    Macular degeneration    Myocardial infarction [facility])    Osteoarthritis    Stroke [facility])     Past Surgical History:  Procedure Laterality Date   CARPAL TUNNEL RELEASE  2011   CORONARY ANGIOPLASTY WITH STENT PLACEMENT     CORONARY ARTERY BYPASS GRAFT  2001   x6   REPLACEMENT TOTAL KNEE     SPINE SURGERY  2008   cervical and lumbar   THYROIDECTOMY       Home Medications:  Prior to Admission medications   Medication Sig Start Date End Date Taking? Authorizing Provider  acetaminophen (TYLENOL) 325 MG tablet Take 650 mg by mouth every 6 (six) hours as needed for moderate pain or headache.   Yes [provider]  apixaban (ELIQUIS) 5 MG TABS tablet Take 1 tablet (5 mg total) by mouth 2 (two) times daily. 11/27/20  Yes Meriam Sprague, MD  b complex vitamins capsule Take 1 capsule by mouth daily.   Yes [provider]  carvedilol (COREG) 12.5 MG  tablet Take 1 tablet (12.5 mg total) by mouth 2 (two) times daily with a meal. 01/08/21  Yes Pemberton, Kathlynn Grate, MD  cholecalciferol (VITAMIN D3) 25 MCG (1000 UNIT) tablet Take 1,000 Units by mouth daily.   Yes [provider]  co-enzyme Q-10 50 MG capsule Take 50 mg by mouth daily.   Yes [provider]  ferrous sulfate 325 (65 FE) MG EC tablet TAKE 1 TABLET(325 MG) BY MOUTH DAILY WITH BREAKFAST Patient taking differently: Take 325 mg by mouth daily with breakfast. 03/13/21  Yes Jaci Standard, MD  glucosamine-chondroitin 500-400 MG tablet Take 1 tablet by mouth 3 (three) times daily.   Yes [provider]  insulin glargine (LANTUS) 100 unit/mL SOPN Inject 15 Units into the skin daily. 10/01/20  Yes Ardith Dark, MD  levothyroxine (SYNTHROID) 100 MCG tablet TAKE 1 TABLET(100 MCG) BY MOUTH DAILY Patient taking differently: Take 100 mcg by mouth daily before breakfast. 02/23/21  Yes Ardith Dark, MD  Multiple Vitamin (MULTIVITAMIN) tablet Take 1 tablet by mouth daily.   Yes [provider]  Red Yeast Rice Extract (RED YEAST RICE PO) Take 1 capsule by mouth in the morning and at bedtime.   Yes [provider]  rosuvastatin (CRESTOR) 20 MG tablet Take 1 tablet (20 mg total) by mouth at  bedtime. 10/20/20  Yes Meriam Sprague, MD  tamsulosin (FLOMAX) 0.4 MG CAPS capsule Take 0.4 mg by mouth daily after supper. 07/11/20  Yes [provider]  Insulin Pen Needle 31G X 5 MM MISC 1 each by Does not apply route daily. Please use a new pen needle daily with Lantus injection 10/03/20   Ardith Dark, MD  mirtazapine (REMERON) 7.5 MG tablet TAKE 1 TABLET(7.5 MG) BY MOUTH AT BEDTIME 03/20/21   Ardith Dark, MD    Inpatient Medications: Scheduled Meds:  apixaban  5 mg Oral BID   carvedilol  3.125 mg Oral BID WC   ferrous sulfate  325 mg Oral Q breakfast   insulin aspart  0-15 Units Subcutaneous TID WC   insulin glargine-yfgn  10 Units  Subcutaneous Daily   levothyroxine  100 mcg Oral QAC breakfast   midodrine  2.5 mg Oral TID WC   mirtazapine  7.5 mg Oral QHS   rosuvastatin  20 mg Oral QHS   sodium chloride flush  3 mL Intravenous Q12H   Continuous Infusions:  PRN Meds: acetaminophen **OR** acetaminophen, ondansetron **OR** ondansetron (ZOFRAN) IV  Allergies:    Allergies  Allergen Reactions   Amitriptyline Shortness Of Breath and Palpitations   Lyrica [Pregabalin] Nausea And Vomiting   Pravachol [Pravastatin] Other (See Comments)    Muscle cramps and aches    Effexor Xr [Venlafaxine] Anxiety   Morphine Palpitations   Zolpidem Anxiety and Palpitations    Social History:   Social History   Socioeconomic History   Marital status: Married    Spouse name: Not on file   Number of children: Not on file   Years of education: Not on file   Highest education level: Not on file  Occupational History   Not on file  Tobacco Use   Smoking status: Former    Types: Pipe   Smokeless tobacco: Never  Substance and Sexual Activity   Alcohol use: Never   Drug use: Never   Sexual activity: Not on file  Other Topics Concern   Not on file  Social History Narrative   Not on file   Social Determinants of Health   Financial Resource Strain: Low Risk    Difficulty of Paying Living Expenses: Not very hard  Food Insecurity: Not on file  Transportation Needs: Not on file  Physical Activity: Not on file  Stress: Not on file  Social Connections: Not on file  Intimate Partner Violence: Not on file    Family History:    Family History  Problem Relation Age of Onset   Stroke Mother    Stroke Father    Heart disease Sister    Cancer Daughter    Hypertension Son    Heart disease Brother    Cancer Brother    Heart disease Brother      ROS:  Please see the history of present illness.   All other ROS reviewed and negative.     Physical Exam/Data:   Vitals:   03/20/21 0816 03/20/21 1121 03/20/21 1942  03/21/21 0549  BP: 132/62 (!) 145/50 (!) 140/49 (!) 147/86  Pulse: (!) 109 71 64 61  Resp: 18 18 18 17   Temp:  98.5 F (36.9 C) 98.7 F (37.1 C) 99 F (37.2 C)  TempSrc:  Oral Oral Oral  SpO2:  96% 98%   Weight:    80.2 kg  Height:        Intake/Output Summary (Last 24 hours) at 03/21/2021 [phone]  Last data filed at 03/21/2021 0900 Gross per 24 hour  Intake 360 ml  Output 1150 ml  Net -790 ml   Last 3 Weights 03/21/2021 03/20/2021 03/19/2021  Weight (lbs) 176 lb 12.9 oz 165 lb 162 lb 9.6 oz  Weight (kg) 80.2 kg 74.844 kg 73.755 kg     Body mass index is 26.88 kg/m.  General:  Chronically ill appearing HEENT: normal Neck: no JVD Vascular: No carotid bruits; Distal pulses 2+ bilaterally Cardiac:  normal S1, S2; RRR; no murmur. PPM site well healed. Lungs:  clear to auscultation bilaterally, no wheezing, rhonchi or rales  Abd: soft, nontender, no hepatomegaly  Ext: no edema Musculoskeletal:  No deformities, BUE and BLE strength normal and equal Skin: warm and dry  Neuro:  CNs 2-12 intact, no focal abnormalities noted Psych:  Normal affect   EKG:  The EKG was personally reviewed and demonstrates:  atrial pacing and bigeminal PVC. PVC are RBBB, superior axis with a biphasic lead I appearance. QT 400 on conducted beats.  Telemetry:  Telemetry was personally reviewed and demonstrates:  bigeminy  Relevant CV Studies:  I have reviewed the device interrogation, base rate at 70 bpm.   Laboratory Data:  High Sensitivity Troponin:  No results for input(s): TROPONINIHS in the last 720 hours.   Chemistry Recent Labs  Lab 03/16/21 1721 03/17/21 0326 03/18/21 0322  NA 138 138 140  K 4.0 3.5 3.7  CL 105 105 107  CO2 23 23 23   GLUCOSE 273* 259* 97  BUN 19 18 13   CREATININE 1.16 1.04 0.95  CALCIUM 8.9 8.8* 8.5*  MG 1.8 1.9  --   GFRNONAA 60* >60 >60  ANIONGAP 10 10 10     Recent Labs  Lab 03/16/21 1721  PROT 6.2*  ALBUMIN 3.3*  AST 29  ALT 22  ALKPHOS 47  BILITOT 0.2*    Lipids No results for input(s): CHOL, TRIG, HDL, LABVLDL, LDLCALC, CHOLHDL in the last 168 hours.  Hematology Recent Labs  Lab 03/16/21 1721  WBC 8.9  RBC 3.59*  HGB 11.8*  HCT 34.9*  MCV 97.2  MCH 32.9  MCHC 33.8  RDW 13.7  PLT 156   Thyroid  Recent Labs  Lab 03/16/21 2155  TSH 3.529    BNPNo results for input(s): BNP, PROBNP in the last 168 hours.  DDimer No results for input(s): DDIMER in the last 168 hours.   Radiology/Studies:  ECHOCARDIOGRAM COMPLETE  Result Date: 03/17/2021    ECHOCARDIOGRAM REPORT   Patient Name:   Thomas [name] Date of Exam: 03/17/2021 Medical Rec #:  03/19/2021       Height:       68.0 in Accession #:    [name]      Weight:       163.0 lb Date of Birth:  [birth year]       BSA:          1.874 m Patient Age:    89 years        BP:           162/61 mmHg Patient Gender: M               HR:           72 bpm. Exam Location:  Inpatient Procedure: 2D Echo Indications:    CAD of native vessel  History:        Patient has prior history of Echocardiogram examinations, most  recent 08/07/2020. Prior CABG and Pacemaker; Risk                 Factors:Hypertension, Dyslipidemia and Diabetes.  Sonographer:    Delcie Roch RDCS Referring Phys: [id number] CHRISTOPHER L SCHUMANN IMPRESSIONS  1. Left ventricular ejection fraction, by estimation, is 60 to 65%. The left ventricle has normal function. The left ventricle has no regional wall motion abnormalities. There is moderate asymmetric left ventricular hypertrophy of the septal segment. Left ventricular diastolic parameters are consistent with Grade I diastolic dysfunction (impaired relaxation).  2. Right ventricular systolic function is normal. The right ventricular size is normal. There is normal pulmonary artery systolic pressure. The estimated right ventricular systolic pressure is 25.8 mmHg.  3. Left atrial size was moderately dilated.  4. The mitral valve is degenerative. Mild mitral valve  regurgitation. No evidence of mitral stenosis.  5. The aortic valve is calcified. Aortic valve regurgitation is not visualized. Mild to moderate aortic valve sclerosis/calcification is present, without any evidence of aortic stenosis.  6. Aortic dilatation noted. There is mild dilatation of the aortic root, measuring 38 mm.  7. The inferior vena cava is normal in size with greater than 50% respiratory variability, suggesting right atrial pressure of 3 mmHg. FINDINGS  Left Ventricle: Left ventricular ejection fraction, by estimation, is 60 to 65%. The left ventricle has normal function. The left ventricle has no regional wall motion abnormalities. The left ventricular internal cavity size was normal in size. There is  moderate asymmetric left ventricular hypertrophy of the septal segment. Left ventricular diastolic parameters are consistent with Grade I diastolic dysfunction (impaired relaxation). Normal left ventricular filling pressure. Right Ventricle: The right ventricular size is normal. No increase in right ventricular wall thickness. Right ventricular systolic function is normal. There is normal pulmonary artery systolic pressure. The tricuspid regurgitant velocity is 2.39 m/s, and  with an assumed right atrial pressure of 3 mmHg, the estimated right ventricular systolic pressure is 25.8 mmHg. Left Atrium: Left atrial size was moderately dilated. Right Atrium: Right atrial size was normal in size. Pericardium: There is no evidence of pericardial effusion. Mitral Valve: The mitral valve is degenerative in appearance. There is mild thickening of the mitral valve leaflet(s). There is mild calcification of the mitral valve leaflet(s). Mild to moderate mitral annular calcification. Mild mitral valve regurgitation. No evidence of mitral valve stenosis. Tricuspid Valve: The tricuspid valve is normal in structure. Tricuspid valve regurgitation is mild . No evidence of tricuspid stenosis. Aortic Valve: The aortic valve  is calcified. Aortic valve regurgitation is not visualized. Mild to moderate aortic valve sclerosis/calcification is present, without any evidence of aortic stenosis. Pulmonic Valve: The pulmonic valve was normal in structure. Pulmonic valve regurgitation is not visualized. No evidence of pulmonic stenosis. Aorta: Aortic dilatation noted. There is mild dilatation of the aortic root, measuring 38 mm. Venous: The inferior vena cava is normal in size with greater than 50% respiratory variability, suggesting right atrial pressure of 3 mmHg. IAS/Shunts: No atrial level shunt detected by color flow Doppler. Additional Comments: A device lead is visualized.  LEFT VENTRICLE PLAX 2D LVIDd:         4.60 cm     Diastology LVIDs:         3.10 cm     LV e' medial:    3.26 cm/s LV PW:         1.00 cm     LV E/e' medial:  15.4 LV IVS:  1.50 cm     LV e' lateral:   6.09 cm/s LVOT diam:     1.90 cm     LV E/e' lateral: 8.2 LV SV:         48 LV SV Index:   26 LVOT Area:     2.84 cm  LV Volumes (MOD) LV vol d, MOD A4C: 79.8 ml LV vol s, MOD A4C: 51.2 ml LV SV MOD A4C:     79.8 ml RIGHT VENTRICLE RV S prime:     7.83 cm/s TAPSE (M-mode): 1.4 cm LEFT ATRIUM             Index       RIGHT ATRIUM           Index LA diam:        4.20 cm 2.24 cm/m  RA Area:     14.90 cm LA Vol (A2C):   80.3 ml 42.86 ml/m RA Volume:   35.60 ml  19.00 ml/m LA Vol (A4C):   75.4 ml 40.24 ml/m LA Biplane Vol: 81.9 ml 43.71 ml/m  AORTIC VALVE LVOT Vmax:   82.70 cm/s LVOT Vmean:  56.600 cm/s LVOT VTI:    0.170 m  AORTA Ao Root diam: 3.80 cm Ao Asc diam:  3.60 cm MV E velocity: 50.10 cm/s  TRICUSPID VALVE MV A velocity: 92.10 cm/s  TR Peak grad:   22.8 mmHg MV E/A ratio:  0.54        TR Vmax:        239.00 cm/s                             SHUNTS                            Systemic VTI:  0.17 m                            Systemic Diam: 1.90 cm Armanda Magic MD Electronically signed by Armanda Magic MD Signature Date/Time: 03/17/2021/4:59:06 PM    Final       Assessment and Plan:   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  QT acceptable for antiarrhythmic initiation. Recommend starting amiodarone 200mg  PO BID x 5 days then decrease to 200mg  PO daily thereafter. I would not change pacemaker settings at this time given base rate at 70bpm.  Patient should have CMP, TSH and FT4 in 6-8 weeks after starting his amiodarone.  Follow up with EP APP in 4-6 weeks.  EP to sign off.  For questions or updates, please contact [facility] Please consult [email or web address] for contact info under    Signed, , MD  03/21/2021 9:37 AM

## 2021-03-21 NOTE — TOC Progression Note (Signed)
Transition of Care [facility]) - Progression Note    Patient Details  Name: Thomas [name] MRN: [id number] Date of Birth: [birth year]  [facility] CM/SW Contact  Mearl Latin, LCSW Phone Number: 03/21/2021, 9:41 AM  Clinical Narrative:    Insurance approval received for [name], [id number], effective 03/20/2021-03/24/2021.   Expected Discharge Plan: Skilled Nursing Facility Barriers to Discharge: Continued Medical Work up  Expected Discharge Plan and Services Expected Discharge Plan: Skilled Nursing Facility   Discharge Planning Services: CM Consult Post Acute Care Choice: Skilled Nursing Facility Living arrangements for the past 2 months: Single Family Home                   DME Agency: NA       HH Arranged: NA           Social Determinants of Health (SDOH) Interventions    Readmission Risk Interventions No flowsheet data found.

## 2021-03-21 NOTE — Progress Notes (Signed)
PROGRESS NOTE  Thomas [name] PNT:[id number] DOB: Apr 13, 1929   PCP: Ardith Dark, MD  Patient is from: Home  DOA: 03/16/2021 LOS: 4  Chief complaints:  Chief Complaint  Patient presents with   Near Syncope     Brief Narrative / Interim history: 87 year old M with PMH of CAD/CABG in 2001, DM-2, orthostatic hypotension, PE/clotting disorder on Eliquis, CVA, SSS/PPM, HTN and HLD presenting with generalized weakness and near syncope with heart monitor at home showing a pulse in 30s and 40s.  On arrival to ED, BP 151/41.  HR in 70s to 80s on monitor but with new onset bigeminy.  PPM interrogation without significant events recently.  Patient was admitted and cardiology consulted and following.  Subjective: Seen and examined earlier this morning.  No major events overnight of this morning.  Complaining left knee pain due to arthritis.  He has right knee replacement.  He says he could not get the left knee surgery due to his heart issue.  He denies chest pain, shortness of breath, palpitation, dizziness, lightheadedness, GI or UTI symptoms.  Objective: Vitals:   03/20/21 1121 03/20/21 1942 03/21/21 0549 03/21/21 1211  BP: (!) 145/50 (!) 140/49 (!) 147/86 (!) 144/81  Pulse: 71 64 61 71  Resp: 18 18 17 18   Temp: 98.5 F (36.9 C) 98.7 F (37.1 C) 99 F (37.2 C) 97.7 F (36.5 C)  TempSrc: Oral Oral Oral Oral  SpO2: 96% 98%  98%  Weight:   80.2 kg   Height:        Intake/Output Summary (Last 24 hours) at 03/21/2021 1428 Last data filed at 03/21/2021 1229 Gross per 24 hour  Intake 840 ml  Output 800 ml  Net 40 ml   Filed Weights   03/19/21 0513 03/20/21 0459 03/21/21 0549  Weight: 73.8 kg 74.8 kg 80.2 kg    Examination:  GENERAL: No apparent distress.  Nontoxic. HEENT: MMM.  Vision and hearing grossly intact.  NECK: Supple.  No apparent JVD.  RESP: 98% on RA.  No IWOB.  Fair aeration bilaterally. CVS: HR 35 bpm, regular, heart sounds normal.  ABD/GI/GU: BS+. Abd soft,  NTND.  MSK/EXT:  Moves extremities. No apparent deformity. No edema.  Fair ROM in left knee.  No erythema or swelling.  No focal tenderness. SKIN: no apparent skin lesion or wound NEURO: Awake, alert and oriented appropriately.  No apparent focal neuro deficit. PSYCH: Calm. Normal affect.   Procedures:  None  Microbiology summarized: COVID-19 and influenza PCR nonreactive.  Assessment & Plan: Near syncope/orthostatic hypotension/bigeminy.  Orthostatic vitals negative today.  But HR at 35 on my exam although EKG rate is at 76 with bigeminy.  Seen by cardiology.  EP has been consulted and gave recommendation. -Appreciate care by cardiology and electrophysiology  -Continue Coreg 3.125 mg twice daily  -Continue midodrine 2.5 mg 3 times daily  -EP recommended starting amnio 200 mg twice daily for 5 days followed by 200 mg daily  -CMP, TSH and free T4 in 6 to 8 weeks -Fall precaution   History of PE/hypercoagulability -Continue Eliquis   Essential hypertension: BP slightly elevated today.  He is off losartan, and on midodrine -He may come off midodrine if okay with cardiology. -Continue low-dose Coreg   CAD s/p CABG in 2001: Continue Coreg and statin.  Uncontrolled controlled DM-2 with hyperglycemia: A1c 7.4%.  On Lantus 15 units at home. Recent Labs  Lab 03/20/21 1119 03/20/21 1623 03/20/21 2114 03/21/21 0601 03/21/21 1147  GLUCAP 172* 184* 186*  165* 239*  -Continue Lantus 10 units daily -Continue SSI-moderate.  Added nightly coverage -Add mealtime coverage at 3 units 3 times daily -Continue statin.  Hypothyroidism: TSH within normal -Continue Synthroid.  Osteoarthritis/left knee pain-exam reassuring. -Continue Tylenol -Add Voltaren gel -Check uric acid   Physical deconditioning/debility:  -Therapy recommended SNF.  TOC working on it  Body mass index is 26.88 kg/m.         DVT prophylaxis:   apixaban (ELIQUIS) tablet 5 mg  Code Status: Full code except for  intubation Family Communication: Patient and/or RN. Available if any question.  Level of care: Telemetry Cardiac Status is: Inpatient  Remains inpatient appropriate because:Hemodynamically unstable, Unsafe d/c plan, and Inpatient level of care appropriate due to severity of illness  Dispo: The patient is from: Home              Anticipated d/c is to: SNF              Patient currently is not medically stable to d/c.   Difficult to place patient No       Consultants:  Electrophysiology-signed off Cardiology   Sch Meds:  Scheduled Meds:  apixaban  5 mg Oral BID   carvedilol  3.125 mg Oral BID WC   diclofenac Sodium  2 g Topical QID   ferrous sulfate  325 mg Oral Q breakfast   insulin aspart  0-15 Units Subcutaneous TID WC   insulin glargine-yfgn  10 Units Subcutaneous Daily   levothyroxine  100 mcg Oral QAC breakfast   midodrine  2.5 mg Oral TID WC   mirtazapine  7.5 mg Oral QHS   rosuvastatin  20 mg Oral QHS   sodium chloride flush  3 mL Intravenous Q12H   Continuous Infusions: PRN Meds:.acetaminophen **OR** acetaminophen, ondansetron **OR** ondansetron (ZOFRAN) IV  Antimicrobials: Anti-infectives (From admission, onward)    None        I have personally reviewed the following labs and images: CBC: Recent Labs  Lab 03/16/21 1721  WBC 8.9  NEUTROABS 4.8  HGB 11.8*  HCT 34.9*  MCV 97.2  PLT 156   BMP &GFR Recent Labs  Lab 03/16/21 1721 03/17/21 0326 03/18/21 0322  NA 138 138 140  K 4.0 3.5 3.7  CL 105 105 107  CO2 23 23 23   GLUCOSE 273* 259* 97  BUN 19 18 13   CREATININE 1.16 1.04 0.95  CALCIUM 8.9 8.8* 8.5*  MG 1.8 1.9  --   PHOS 3.6  --   --    Estimated Creatinine Clearance: 50 mL/min (by C-G formula based on SCr of 0.95 mg/dL). Liver & Pancreas: Recent Labs  Lab 03/16/21 1721  AST 29  ALT 22  ALKPHOS 47  BILITOT 0.2*  PROT 6.2*  ALBUMIN 3.3*   No results for input(s): LIPASE, AMYLASE in the last 168 hours. No results for  input(s): AMMONIA in the last 168 hours. Diabetic: No results for input(s): HGBA1C in the last 72 hours. Recent Labs  Lab 03/20/21 1119 03/20/21 1623 03/20/21 2114 03/21/21 0601 03/21/21 1147  GLUCAP 172* 184* 186* 165* 239*   Cardiac Enzymes: No results for input(s): CKTOTAL, CKMB, CKMBINDEX, TROPONINI in the last 168 hours. No results for input(s): PROBNP in the last 8760 hours. Coagulation Profile: No results for input(s): INR, PROTIME in the last 168 hours. Thyroid Function Tests: No results for input(s): TSH, T4TOTAL, FREET4, T3FREE, THYROIDAB in the last 72 hours. Lipid Profile: No results for input(s): CHOL, HDL, LDLCALC, TRIG, CHOLHDL, LDLDIRECT  in the last 72 hours. Anemia Panel: No results for input(s): VITAMINB12, FOLATE, FERRITIN, TIBC, IRON, RETICCTPCT in the last 72 hours. Urine analysis:    Component Value Date/Time   COLORURINE YELLOW 08/31/2020 0913   APPEARANCEUR CLEAR 08/31/2020 0913   LABSPEC 1.016 08/31/2020 0913   PHURINE 5.0 08/31/2020 0913   GLUCOSEU 50 (A) 08/31/2020 0913   GLUCOSEU 100 (A) 08/22/2020 1122   HGBUR NEGATIVE 08/31/2020 0913   BILIRUBINUR NEGATIVE 08/31/2020 0913   KETONESUR NEGATIVE 08/31/2020 0913   PROTEINUR NEGATIVE 08/31/2020 0913   UROBILINOGEN 0.2 08/22/2020 1122   NITRITE NEGATIVE 08/31/2020 0913   LEUKOCYTESUR NEGATIVE 08/31/2020 0913   Sepsis Labs: Invalid input(s): PROCALCITONIN, LACTICIDVEN  Microbiology: Recent Results (from the past 240 hour(s))  Resp Panel by RT-PCR (Flu A&B, Covid)     Status: None   Collection Time: 03/17/21  2:26 PM  Result Value Ref Range Status   SARS Coronavirus 2 by RT PCR NEGATIVE NEGATIVE Final    Comment: (NOTE) SARS-CoV-2 target nucleic acids are NOT DETECTED.  The SARS-CoV-2 RNA is generally detectable in upper respiratory specimens during the acute phase of infection. The lowest concentration of SARS-CoV-2 viral copies this assay can detect is 138 copies/mL. A negative result  does not preclude SARS-Cov-2 infection and should not be used as the sole basis for treatment or other patient management decisions. A negative result may occur with  improper specimen collection/handling, submission of specimen other than nasopharyngeal swab, presence of viral mutation(s) within the areas targeted by this assay, and inadequate number of viral copies(<138 copies/mL). A negative result must be combined with clinical observations, patient history, and epidemiological information. The expected result is Negative.  Fact Sheet for Patients:  BloggerCourse.com  Fact Sheet for Healthcare Providers:  SeriousBroker.it  This test is no t yet approved or cleared by the [place] FDA and  has been authorized for detection and/or diagnosis of SARS-CoV-2 by FDA under an Emergency Use Authorization (EUA). This EUA will remain  in effect (meaning this test can be used) for the duration of the COVID-19 declaration under Section 564(b)(1) of the Act, 21 U.S.C.section 360bbb-3(b)(1), unless the authorization is terminated  or revoked sooner.       Influenza A by PCR NEGATIVE NEGATIVE Final   Influenza B by PCR NEGATIVE NEGATIVE Final    Comment: (NOTE) The Xpert Xpress SARS-CoV-2/FLU/RSV plus assay is intended as an aid in the diagnosis of influenza from Nasopharyngeal swab specimens and should not be used as a sole basis for treatment. Nasal washings and aspirates are unacceptable for Xpert Xpress SARS-CoV-2/FLU/RSV testing.  Fact Sheet for Patients: BloggerCourse.com  Fact Sheet for Healthcare Providers: SeriousBroker.it  This test is not yet approved or cleared by the [place] FDA and has been authorized for detection and/or diagnosis of SARS-CoV-2 by FDA under an Emergency Use Authorization (EUA). This EUA will remain in effect (meaning this test can be used) for  the duration of the COVID-19 declaration under Section 564(b)(1) of the Act, 21 U.S.C. section 360bbb-3(b)(1), unless the authorization is terminated or revoked.  Performed at [facility], [address] [place], [place] [address]     Radiology Studies: No results found.    Perley Arthurs T. Sereena Marando [facility]  If 7PM-7AM, please contact night-coverage [email or web address] 03/21/2021, 2:28 PM

## 2021-03-22 DIAGNOSIS — D696 Thrombocytopenia, unspecified: Secondary | ICD-10-CM

## 2021-03-22 DIAGNOSIS — D649 Anemia, unspecified: Secondary | ICD-10-CM

## 2021-03-22 DIAGNOSIS — D689 Coagulation defect, unspecified: Secondary | ICD-10-CM | POA: Diagnosis not present

## 2021-03-22 DIAGNOSIS — I498 Other specified cardiac arrhythmias: Secondary | ICD-10-CM | POA: Diagnosis not present

## 2021-03-22 DIAGNOSIS — I951 Orthostatic hypotension: Secondary | ICD-10-CM | POA: Diagnosis not present

## 2021-03-22 DIAGNOSIS — Z950 Presence of cardiac pacemaker: Secondary | ICD-10-CM | POA: Diagnosis not present

## 2021-03-22 DIAGNOSIS — R55 Syncope and collapse: Secondary | ICD-10-CM | POA: Diagnosis not present

## 2021-03-22 DIAGNOSIS — I2782 Chronic pulmonary embolism: Secondary | ICD-10-CM | POA: Diagnosis not present

## 2021-03-22 LAB — RENAL FUNCTION PANEL
Albumin: 2.7 g/dL — ABNORMAL LOW (ref 3.5–5.0)
Anion gap: 7 (ref 5–15)
BUN: 23 mg/dL (ref 8–23)
CO2: 25 mmol/L (ref 22–32)
Calcium: 8.7 mg/dL — ABNORMAL LOW (ref 8.9–10.3)
Chloride: 106 mmol/L (ref 98–111)
Creatinine, Ser: 1.03 mg/dL (ref 0.61–1.24)
GFR, Estimated: >60 mL/min (ref >60)
Glucose, Bld: 132 mg/dL — ABNORMAL HIGH (ref 70–99)
Phosphorus: 4.1 mg/dL (ref 2.5–4.6)
Potassium: 4.1 mmol/L (ref 3.5–5.1)
Sodium: 138 mmol/L (ref 135–145)

## 2021-03-22 LAB — URIC ACID: Uric Acid, Serum: 5.5 mg/dL (ref 3.7–8.6)

## 2021-03-22 LAB — GLUCOSE, CAPILLARY
Glucose-Capillary: 131 mg/dL — ABNORMAL HIGH (ref 70–99)
Glucose-Capillary: 170 mg/dL — ABNORMAL HIGH (ref 70–99)
Glucose-Capillary: 122 mg/dL — ABNORMAL HIGH (ref 70–99)
Glucose-Capillary: 166 mg/dL — ABNORMAL HIGH (ref 70–99)

## 2021-03-22 LAB — CBC
HCT: 32.9 % — ABNORMAL LOW (ref 39.0–52.0)
Hemoglobin: 10.8 g/dL — ABNORMAL LOW (ref 13.0–17.0)
MCH: 31.6 pg (ref 26.0–34.0)
MCHC: 32.8 g/dL (ref 30.0–36.0)
MCV: 96.2 fL (ref 80.0–100.0)
Platelets: 137 10*3/uL — ABNORMAL LOW (ref 150–400)
RBC: 3.42 MIL/uL — ABNORMAL LOW (ref 4.22–5.81)
RDW: 13.2 % (ref 11.5–15.5)
WBC: 9.8 10*3/uL (ref 4.0–10.5)
nRBC: 0 % (ref 0.0–0.2)

## 2021-03-22 LAB — MAGNESIUM: Magnesium: 1.6 mg/dL — ABNORMAL LOW (ref 1.7–2.4)

## 2021-03-22 MED ORDER — AMIODARONE HCL 200 MG PO TABS: 200.0000 mg | ORAL_TABLET | Freq: Every day | ORAL | Status: DC

## 2021-03-22 MED ORDER — AMIODARONE HCL 200 MG PO TABS
200.0000 mg | ORAL_TABLET | Freq: Two times a day (BID) | ORAL | Status: DC
  Administered 2021-03-22 – 2021-03-23 (×3): 200 mg via ORAL
  Filled 2021-03-22 (×3): qty 1

## 2021-03-22 MED ORDER — MAGNESIUM SULFATE 2 GM/50ML IV SOLN
2.0000 g | Freq: Once | INTRAVENOUS | Status: AC
  Administered 2021-03-22: 2 g via INTRAVENOUS
  Filled 2021-03-22: qty 50

## 2021-03-22 NOTE — Progress Notes (Signed)
PROGRESS NOTE  Thomas [name] KGM:[id number] DOB: December 04, 1928   PCP: Ardith Dark, MD  Patient is from: Home  DOA: 03/16/2021 LOS: 5  Chief complaints:  Chief Complaint  Patient presents with   Near Syncope     Brief Narrative / Interim history: 87 year old M with PMH of CAD/CABG in 2001, DM-2, orthostatic hypotension, PE/clotting disorder on Eliquis, CVA, SSS/PPM, HTN and HLD presenting with generalized weakness and near syncope with heart monitor at home showing a pulse in 30s and 40s.  On arrival to ED, BP 151/41.  HR in 70s to 80s on monitor but with new onset bigeminy.  PPM interrogation without significant events recently.  Patient was admitted and cardiology consulted and following.  EP started on amiodarone.   Therapy recommended SNF.  Subjective: Seen and examined earlier this morning.  No major events overnight of this morning.  He reports headache and asking for Tylenol.  Headache is over occipital area.  Denies acute vision change or focal neuro symptoms.  He denies chest pain, dyspnea, dizziness, GI or UTI symptoms.  He says his left knee is about the same.  He is asking when he could go to rehab.  Objective: Vitals:   03/21/21 0549 03/21/21 1211 03/21/21 2024 03/22/21 0904  BP: (!) 147/86 (!) 144/81 (!) 158/53 (!) 164/64  Pulse: 61 71 (!) 45 70  Resp: 17 18 18 18   Temp: 99 F (37.2 C) 97.7 F (36.5 C) 98.6 F (37 C) 97.6 F (36.4 C)  TempSrc: Oral Oral Oral Oral  SpO2:  98% 100% 100%  Weight: 80.2 kg     Height:        Intake/Output Summary (Last 24 hours) at 03/22/2021 1333 Last data filed at 03/22/2021 1123 Gross per 24 hour  Intake 120 ml  Output 1400 ml  Net -1280 ml   Filed Weights   03/19/21 0513 03/20/21 0459 03/21/21 0549  Weight: 73.8 kg 74.8 kg 80.2 kg    Examination:  GENERAL: No apparent distress.  Nontoxic. HEENT: MMM.  Vision and hearing grossly intact.  NECK: Supple.  No apparent JVD.  RESP: 100% on RA.  No IWOB.  Fair aeration  bilaterally. CVS: Irregular rhythm.  Normal rate.  Heart sounds normal.  ABD/GI/GU: BS+. Abd soft, NTND.  MSK/EXT:  Moves extremities. No apparent deformity. No edema.  SKIN: no apparent skin lesion or wound NEURO: Awake and alert. Oriented appropriately.  No apparent focal neuro deficit. PSYCH: Calm. Normal affect.   Procedures:  None  Microbiology summarized: COVID-19 and influenza PCR nonreactive.  Assessment & Plan: Near syncope/orthostatic hypotension/bigeminy.  Orthostatic vitals negative today.  But HR at 35 on my exam although EKG rate is at 76 with bigeminy.  Seen by cardiology.  EP has been consulted and gave recommendation.  Orthostatic hypotension seem to have resolved. -Appreciate care by cardiology and electrophysiology  -Continue Coreg 3.125 mg twice daily  -Continue midodrine 2.5 mg 3 times daily  -EP rec on 9/24-amnio 200 mg BID for 5 days, then 200 mg daily.  We will start today  -CMP, TSH and free T4 in 6 to 8 weeks -Fall precaution   History of PE/hypercoagulability -Continue Eliquis   Essential hypertension: BP slightly elevated today.  He is off losartan, and on midodrine -He may come off midodrine if okay with cardiology. -Continue low-dose Coreg   CAD s/p CABG in 2001: Continue Coreg and statin.  Uncontrolled controlled DM-2 with hyperglycemia: A1c 7.4%.  On Lantus 15 units at home. Recent  Labs  Lab 03/21/21 1147 03/21/21 1610 03/21/21 2104 03/22/21 0635 03/22/21 1228  GLUCAP 239* 182* 225* 131* 170*  -Continue Lantus 10 units daily -Continue SSI-moderate.  -Continue mealtime coverage at 3 units 3 times daily -Continue statin.  Hypothyroidism: TSH within normal -Continue Synthroid.  Osteoarthritis/left knee pain-exam reassuring.  Uric acid 5.5. -Continue Tylenol -Added Voltaren gel  Normocytic anemia: Stable. Recent Labs    08/27/20 1526 08/31/20 0059 08/31/20 0101 09/01/20 0337 09/02/20 0405 09/05/20 0403 09/08/20 0222  10/11/20 1335 03/16/21 1721 03/22/21 0253  HGB 11.1* 11.0* 10.5* 10.4* 10.6* 10.9* 10.2* 10.9* 11.8* 10.8*  -Check anemia panel in the morning  Hypomagnesemia: 1.6 -IV magnesium sulfate 2 g x 1  Thrombocytopenia Recent Labs  Lab 03/16/21 1721 03/22/21 0253  PLT 156 137*  -Recheck in the morning   Physical deconditioning/debility:  -Therapy recommended SNF.  TOC working on it  Body mass index is 26.88 kg/m.         DVT prophylaxis:   apixaban (ELIQUIS) tablet 5 mg  Code Status: Full code except for intubation Family Communication: Patient and/or RN. Available if any question.  Level of care: Telemetry Cardiac Status is: Inpatient  Remains inpatient appropriate because:Hemodynamically unstable, Unsafe d/c plan, and Inpatient level of care appropriate due to severity of illness  Dispo: The patient is from: Home              Anticipated d/c is to: SNF              Patient currently is not medically stable to d/c.   Difficult to place patient No       Consultants:  Electrophysiology-signed off Cardiology   Sch Meds:  Scheduled Meds:  amiodarone  200 mg Oral BID   Followed by   Melene Muller ON 03/27/2021] amiodarone  200 mg Oral Daily   apixaban  5 mg Oral BID   carvedilol  3.125 mg Oral BID WC   diclofenac Sodium  2 g Topical QID   ferrous sulfate  325 mg Oral Q breakfast   insulin aspart  0-15 Units Subcutaneous TID WC   insulin aspart  0-5 Units Subcutaneous QHS   insulin aspart  3 Units Subcutaneous TID WC   insulin glargine-yfgn  10 Units Subcutaneous Daily   levothyroxine  100 mcg Oral QAC breakfast   midodrine  2.5 mg Oral TID WC   mirtazapine  7.5 mg Oral QHS   rosuvastatin  20 mg Oral QHS   sodium chloride flush  3 mL Intravenous Q12H   Continuous Infusions: PRN Meds:.acetaminophen **OR** acetaminophen, ondansetron **OR** ondansetron (ZOFRAN) IV  Antimicrobials: Anti-infectives (From admission, onward)    None        I have personally  reviewed the following labs and images: CBC: Recent Labs  Lab 03/16/21 1721 03/22/21 0253  WBC 8.9 9.8  NEUTROABS 4.8  --   HGB 11.8* 10.8*  HCT 34.9* 32.9*  MCV 97.2 96.2  PLT 156 137*   BMP &GFR Recent Labs  Lab 03/16/21 1721 03/17/21 0326 03/18/21 0322 03/22/21 0253  NA 138 138 140 138  K 4.0 3.5 3.7 4.1  CL 105 105 107 106  CO2 23 23 23 25   GLUCOSE 273* 259* 97 132*  BUN 19 18 13 23   CREATININE 1.16 1.04 0.95 1.03  CALCIUM 8.9 8.8* 8.5* 8.7*  MG 1.8 1.9  --  1.6*  PHOS 3.6  --   --  4.1   Estimated Creatinine Clearance: 46.1 mL/min (by C-G formula  based on SCr of 1.03 mg/dL). Liver & Pancreas: Recent Labs  Lab 03/16/21 1721 03/22/21 0253  AST 29  --   ALT 22  --   ALKPHOS 47  --   BILITOT 0.2*  --   PROT 6.2*  --   ALBUMIN 3.3* 2.7*   No results for input(s): LIPASE, AMYLASE in the last 168 hours. No results for input(s): AMMONIA in the last 168 hours. Diabetic: No results for input(s): HGBA1C in the last 72 hours. Recent Labs  Lab 03/21/21 1147 03/21/21 1610 03/21/21 2104 03/22/21 0635 03/22/21 1228  GLUCAP 239* 182* 225* 131* 170*   Cardiac Enzymes: No results for input(s): CKTOTAL, CKMB, CKMBINDEX, TROPONINI in the last 168 hours. No results for input(s): PROBNP in the last 8760 hours. Coagulation Profile: No results for input(s): INR, PROTIME in the last 168 hours. Thyroid Function Tests: No results for input(s): TSH, T4TOTAL, FREET4, T3FREE, THYROIDAB in the last 72 hours. Lipid Profile: No results for input(s): CHOL, HDL, LDLCALC, TRIG, CHOLHDL, LDLDIRECT in the last 72 hours. Anemia Panel: No results for input(s): VITAMINB12, FOLATE, FERRITIN, TIBC, IRON, RETICCTPCT in the last 72 hours. Urine analysis:    Component Value Date/Time   COLORURINE YELLOW 08/31/2020 0913   APPEARANCEUR CLEAR 08/31/2020 0913   LABSPEC 1.016 08/31/2020 0913   PHURINE 5.0 08/31/2020 0913   GLUCOSEU 50 (A) 08/31/2020 0913   GLUCOSEU 100 (A) 08/22/2020  1122   HGBUR NEGATIVE 08/31/2020 0913   BILIRUBINUR NEGATIVE 08/31/2020 0913   KETONESUR NEGATIVE 08/31/2020 0913   PROTEINUR NEGATIVE 08/31/2020 0913   UROBILINOGEN 0.2 08/22/2020 1122   NITRITE NEGATIVE 08/31/2020 0913   LEUKOCYTESUR NEGATIVE 08/31/2020 0913   Sepsis Labs: Invalid input(s): PROCALCITONIN, LACTICIDVEN  Microbiology: Recent Results (from the past 240 hour(s))  Resp Panel by RT-PCR (Flu A&B, Covid)     Status: None   Collection Time: 03/17/21  2:26 PM  Result Value Ref Range Status   SARS Coronavirus 2 by RT PCR NEGATIVE NEGATIVE Final    Comment: (NOTE) SARS-CoV-2 target nucleic acids are NOT DETECTED.  The SARS-CoV-2 RNA is generally detectable in upper respiratory specimens during the acute phase of infection. The lowest concentration of SARS-CoV-2 viral copies this assay can detect is 138 copies/mL. A negative result does not preclude SARS-Cov-2 infection and should not be used as the sole basis for treatment or other patient management decisions. A negative result may occur with  improper specimen collection/handling, submission of specimen other than nasopharyngeal swab, presence of viral mutation(s) within the areas targeted by this assay, and inadequate number of viral copies(<138 copies/mL). A negative result must be combined with clinical observations, patient history, and epidemiological information. The expected result is Negative.  Fact Sheet for Patients:  BloggerCourse.com  Fact Sheet for Healthcare Providers:  SeriousBroker.it  This test is no t yet approved or cleared by the [place] FDA and  has been authorized for detection and/or diagnosis of SARS-CoV-2 by FDA under an Emergency Use Authorization (EUA). This EUA will remain  in effect (meaning this test can be used) for the duration of the COVID-19 declaration under Section 564(b)(1) of the Act, 21 U.S.C.section 360bbb-3(b)(1),  unless the authorization is terminated  or revoked sooner.       Influenza A by PCR NEGATIVE NEGATIVE Final   Influenza B by PCR NEGATIVE NEGATIVE Final    Comment: (NOTE) The Xpert Xpress SARS-CoV-2/FLU/RSV plus assay is intended as an aid in the diagnosis of influenza from Nasopharyngeal swab specimens and should  not be used as a sole basis for treatment. Nasal washings and aspirates are unacceptable for Xpert Xpress SARS-CoV-2/FLU/RSV testing.  Fact Sheet for Patients: BloggerCourse.com  Fact Sheet for Healthcare Providers: SeriousBroker.it  This test is not yet approved or cleared by the [place] FDA and has been authorized for detection and/or diagnosis of SARS-CoV-2 by FDA under an Emergency Use Authorization (EUA). This EUA will remain in effect (meaning this test can be used) for the duration of the COVID-19 declaration under Section 564(b)(1) of the Act, 21 U.S.C. section 360bbb-3(b)(1), unless the authorization is terminated or revoked.  Performed at [facility], [address] [place], [place] [address]     Radiology Studies: No results found.    Neveyah Garzon T. Lakindra Wible [facility]  If 7PM-7AM, please contact night-coverage [email or web address] 03/22/2021, 1:33 PM

## 2021-03-22 NOTE — TOC Progression Note (Addendum)
Transition of Care [facility]) - Progression Note    Patient Details  Name: Thomas [name] MRN: [id number] Date of Birth: [birth year]  Transition of Care [facility] CM/SW Contact  Patrice Paradise, LCSW Phone Number:[phone] 03/22/2021, 10:31 AM  Clinical Narrative:     CSW reached out to facility to ascertain if pt could return today. Alinda Money from [facility] stated that a Monday admission would be better.  Pt will need new COVID to go to facility.  MD updated about DC plans.  12:05PM- Spoke to pt's spouse and updated her about discharge plans.  TOC team will continue to assist with discharge planning needs.   Expected Discharge Plan: Skilled Nursing Facility Barriers to Discharge: Continued Medical Work up  Expected Discharge Plan and Services Expected Discharge Plan: Skilled Nursing Facility   Discharge Planning Services: CM Consult Post Acute Care Choice: Skilled Nursing Facility Living arrangements for the past 2 months: Single Family Home Expected Discharge Date: 03/22/21                 DME Agency: NA       HH Arranged: NA           Social Determinants of Health (SDOH) Interventions    Readmission Risk Interventions No flowsheet data found.

## 2021-03-22 NOTE — Progress Notes (Signed)
DAILY PROGRESS NOTE   Patient Name: Kyrell Ruacho Date of Encounter: 03/22/2021 Cardiologist: Little Ishikawa, MD  Chief Complaint   Knee pain post right TKR left arthritis  Appreciate EP seeing yesterday   Patient Profile   Alford Gamero is a 87 y.o. male with a hx of CAD status post CABG in 2001, sick sinus syndrome status post PPM, pulmonary embolism, CVA, hypertension, T2DM, hyperlipidemia, orthostatic hypotension, hypothyroidism who is being seen 03/16/2021 for the evaluation of weakness/bradycardia at the request of Dr Madilyn Fireman.  Subjective   Sleepy no complaints   Objective   Vitals:   03/21/21 0549 03/21/21 1211 03/21/21 2024 03/22/21 0904  BP: (!) 147/86 (!) 144/81 (!) 158/53 (!) 164/64  Pulse: 61 71 (!) 45 70  Resp: 17 18 18 18   Temp: 99 F (37.2 C) 97.7 F (36.5 C) 98.6 F (37 C) 97.6 F (36.4 C)  TempSrc: Oral Oral Oral Oral  SpO2:  98% 100% 100%  Weight: 80.2 kg     Height:        Intake/Output Summary (Last 24 hours) at 03/22/2021 0943 Last data filed at 03/22/2021 0600 Gross per 24 hour  Intake 480 ml  Output 1100 ml  Net -620 ml   Filed Weights   03/19/21 0513 03/20/21 0459 03/21/21 0549  Weight: 73.8 kg 74.8 kg 80.2 kg    Physical Exam   Affect appropriate Chronically ill elderly male  HEENT: normal Neck supple with no adenopathy JVP normal no bruits no thyromegaly Lungs clear with no wheezing and good diaphragmatic motion Heart:  S1/S2 no murmur, no rub, gallop or click PMI normal  PPM under left clavicle  Abdomen: benighn, BS positve, no tenderness, no AAA no bruit.  No HSM or HJR Distal pulses intact with no bruits No edema Neuro non-focal Skin warm and dry No muscular weakness   Inpatient Medications    Scheduled Meds:  apixaban  5 mg Oral BID   carvedilol  3.125 mg Oral BID WC   diclofenac Sodium  2 g Topical QID   ferrous sulfate  325 mg Oral Q breakfast   insulin aspart  0-15 Units Subcutaneous TID WC    insulin aspart  0-5 Units Subcutaneous QHS   insulin aspart  3 Units Subcutaneous TID WC   insulin glargine-yfgn  10 Units Subcutaneous Daily   levothyroxine  100 mcg Oral QAC breakfast   midodrine  2.5 mg Oral TID WC   mirtazapine  7.5 mg Oral QHS   rosuvastatin  20 mg Oral QHS   sodium chloride flush  3 mL Intravenous Q12H    Continuous Infusions:  magnesium sulfate bolus IVPB 2 g (03/22/21 0911)     PRN Meds: acetaminophen **OR** acetaminophen, ondansetron **OR** ondansetron (ZOFRAN) IV   Labs   Results for orders placed or performed during the hospital encounter of 03/16/21 (from the past 48 hour(s))  Glucose, capillary     Status: Abnormal   Collection Time: 03/20/21 11:19 AM  Result Value Ref Range   Glucose-Capillary 172 (H) 70 - 99 mg/dL    Comment: Glucose reference range applies only to samples taken after fasting for at least 8 hours.  Glucose, capillary     Status: Abnormal   Collection Time: 03/20/21  4:23 PM  Result Value Ref Range   Glucose-Capillary 184 (H) 70 - 99 mg/dL    Comment: Glucose reference range applies only to samples taken after fasting for at least 8 hours.  Glucose, capillary  Status: Abnormal   Collection Time: 03/20/21  9:14 PM  Result Value Ref Range   Glucose-Capillary 186 (H) 70 - 99 mg/dL    Comment: Glucose reference range applies only to samples taken after fasting for at least 8 hours.   Comment 1 Notify RN    Comment 2 Document in Chart   Glucose, capillary     Status: Abnormal   Collection Time: 03/21/21  6:01 AM  Result Value Ref Range   Glucose-Capillary 165 (H) 70 - 99 mg/dL    Comment: Glucose reference range applies only to samples taken after fasting for at least 8 hours.   Comment 1 Notify RN    Comment 2 Document in Chart   Glucose, capillary     Status: Abnormal   Collection Time: 03/21/21 11:47 AM  Result Value Ref Range   Glucose-Capillary 239 (H) 70 - 99 mg/dL    Comment: Glucose reference range applies only to  samples taken after fasting for at least 8 hours.   Comment 1 Notify RN   Glucose, capillary     Status: Abnormal   Collection Time: 03/21/21  4:10 PM  Result Value Ref Range   Glucose-Capillary 182 (H) 70 - 99 mg/dL    Comment: Glucose reference range applies only to samples taken after fasting for at least 8 hours.  Glucose, capillary     Status: Abnormal   Collection Time: 03/21/21  9:04 PM  Result Value Ref Range   Glucose-Capillary 225 (H) 70 - 99 mg/dL    Comment: Glucose reference range applies only to samples taken after fasting for at least 8 hours.  Uric acid     Status: None   Collection Time: 03/22/21  2:53 AM  Result Value Ref Range   Uric Acid, Serum 5.5 3.7 - 8.6 mg/dL    Comment: Performed at [facility], [address] [place], [place] [address]  Renal function panel     Status: Abnormal   Collection Time: 03/22/21  2:53 AM  Result Value Ref Range   Sodium 138 135 - 145 mmol/L   Potassium 4.1 3.5 - 5.1 mmol/L   Chloride 106 98 - 111 mmol/L   CO2 25 22 - 32 mmol/L   Glucose, Bld 132 (H) 70 - 99 mg/dL    Comment: Glucose reference range applies only to samples taken after fasting for at least 8 hours.   BUN 23 8 - 23 mg/dL   Creatinine, Ser 1.51 0.61 - 1.24 mg/dL   Calcium 8.7 (L) 8.9 - 10.3 mg/dL   Phosphorus 4.1 2.5 - 4.6 mg/dL   Albumin 2.7 (L) 3.5 - 5.0 g/dL   GFR, Estimated >76 >16 mL/min    Comment: (NOTE) Calculated using the CKD-EPI Creatinine Equation (2021)    Anion gap 7 5 - 15    Comment: Performed at [facility], [address] [place], [place] [address]  Magnesium     Status: Abnormal   Collection Time: 03/22/21  2:53 AM  Result Value Ref Range   Magnesium 1.6 (L) 1.7 - 2.4 mg/dL    Comment: Performed at [facility], [address], [place], [place] [address]  CBC     Status: Abnormal   Collection Time: 03/22/21  2:53 AM  Result Value Ref Range   WBC 9.8 4.0 - 10.5 K/uL   RBC 3.42 (L) 4.22 - 5.81 MIL/uL   Hemoglobin  10.8 (L) 13.0 - 17.0 g/dL   HCT 48.5 (  L) 39.0 - 52.0 %   MCV 96.2 80.0 - 100.0 fL   MCH 31.6 26.0 - 34.0 pg   MCHC 32.8 30.0 - 36.0 g/dL   RDW 40.3 47.4 - 25.9 %   Platelets 137 (L) 150 - 400 K/uL   nRBC 0.0 0.0 - 0.2 %    Comment: Performed at [facility], [address], [place], [place] [address]  Glucose, capillary     Status: Abnormal   Collection Time: 03/22/21  6:35 AM  Result Value Ref Range   Glucose-Capillary 131 (H) 70 - 99 mg/dL    Comment: Glucose reference range applies only to samples taken after fasting for at least 8 hours.   Comment 1 Notify RN    Comment 2 Document in Chart     ECG   Pacing with bigeminy   Telemetry   Bigeminy and pacing   Radiology    No results found.  Cardiac Studies   None  Assessment   Principal Problem:   Near syncope Active Problems:   Hypertension   Cardiac pacemaker   Type 2 diabetes mellitus with hyperglycemia ([name])   Sick sinus syndrome ([name])   Orthostatic hypotension   Clotting disorder ([name])   Plan   Continues to have issues with orthostasis.  Coreg decreased ARB d/c and now on midodrine. Had PVCls bigeminy with pulse deficit. QT longish Seen by Dr Lalla Brothers EP yesterday and felt ok to start amiodarone Follow ECG/QT. Telemetry this am no bigeminy or PVCls A pacing No PPM reprogramming needed per EP    Charlton Haws 03/22/2021, 9:43 AM  Patient ID: [name], male   DOB: 18-Sep-1928, 87 y.o.   MRN: [id number]

## 2021-03-23 DIAGNOSIS — R41841 Cognitive communication deficit: Secondary | ICD-10-CM | POA: Diagnosis not present

## 2021-03-23 DIAGNOSIS — E1159 Type 2 diabetes mellitus with other circulatory complications: Secondary | ICD-10-CM | POA: Diagnosis not present

## 2021-03-23 DIAGNOSIS — D696 Thrombocytopenia, unspecified: Secondary | ICD-10-CM | POA: Diagnosis not present

## 2021-03-23 DIAGNOSIS — R55 Syncope and collapse: Secondary | ICD-10-CM | POA: Diagnosis not present

## 2021-03-23 DIAGNOSIS — M6281 Muscle weakness (generalized): Secondary | ICD-10-CM | POA: Diagnosis not present

## 2021-03-23 DIAGNOSIS — Z741 Need for assistance with personal care: Secondary | ICD-10-CM | POA: Diagnosis not present

## 2021-03-23 DIAGNOSIS — I152 Hypertension secondary to endocrine disorders: Secondary | ICD-10-CM | POA: Diagnosis not present

## 2021-03-23 DIAGNOSIS — R946 Abnormal results of thyroid function studies: Secondary | ICD-10-CM | POA: Diagnosis not present

## 2021-03-23 DIAGNOSIS — Z950 Presence of cardiac pacemaker: Secondary | ICD-10-CM | POA: Diagnosis not present

## 2021-03-23 DIAGNOSIS — M1990 Unspecified osteoarthritis, unspecified site: Secondary | ICD-10-CM | POA: Diagnosis not present

## 2021-03-23 DIAGNOSIS — E1165 Type 2 diabetes mellitus with hyperglycemia: Secondary | ICD-10-CM | POA: Diagnosis not present

## 2021-03-23 DIAGNOSIS — D649 Anemia, unspecified: Secondary | ICD-10-CM | POA: Diagnosis not present

## 2021-03-23 DIAGNOSIS — I10 Essential (primary) hypertension: Secondary | ICD-10-CM | POA: Diagnosis not present

## 2021-03-23 DIAGNOSIS — R278 Other lack of coordination: Secondary | ICD-10-CM | POA: Diagnosis not present

## 2021-03-23 DIAGNOSIS — R2681 Unsteadiness on feet: Secondary | ICD-10-CM | POA: Diagnosis not present

## 2021-03-23 DIAGNOSIS — I2510 Atherosclerotic heart disease of native coronary artery without angina pectoris: Secondary | ICD-10-CM | POA: Diagnosis not present

## 2021-03-23 DIAGNOSIS — E039 Hypothyroidism, unspecified: Secondary | ICD-10-CM | POA: Diagnosis not present

## 2021-03-23 DIAGNOSIS — E785 Hyperlipidemia, unspecified: Secondary | ICD-10-CM | POA: Diagnosis not present

## 2021-03-23 DIAGNOSIS — R262 Difficulty in walking, not elsewhere classified: Secondary | ICD-10-CM | POA: Diagnosis not present

## 2021-03-23 DIAGNOSIS — R531 Weakness: Secondary | ICD-10-CM | POA: Diagnosis not present

## 2021-03-23 DIAGNOSIS — R269 Unspecified abnormalities of gait and mobility: Secondary | ICD-10-CM | POA: Diagnosis not present

## 2021-03-23 DIAGNOSIS — Z20822 Contact with and (suspected) exposure to covid-19: Secondary | ICD-10-CM | POA: Diagnosis not present

## 2021-03-23 DIAGNOSIS — I495 Sick sinus syndrome: Secondary | ICD-10-CM | POA: Diagnosis not present

## 2021-03-23 DIAGNOSIS — E119 Type 2 diabetes mellitus without complications: Secondary | ICD-10-CM | POA: Diagnosis not present

## 2021-03-23 DIAGNOSIS — Z86711 Personal history of pulmonary embolism: Secondary | ICD-10-CM

## 2021-03-23 DIAGNOSIS — D6859 Other primary thrombophilia: Secondary | ICD-10-CM | POA: Diagnosis not present

## 2021-03-23 DIAGNOSIS — D689 Coagulation defect, unspecified: Secondary | ICD-10-CM | POA: Diagnosis not present

## 2021-03-23 DIAGNOSIS — I951 Orthostatic hypotension: Secondary | ICD-10-CM | POA: Diagnosis not present

## 2021-03-23 DIAGNOSIS — E559 Vitamin D deficiency, unspecified: Secondary | ICD-10-CM | POA: Diagnosis not present

## 2021-03-23 LAB — SARS CORONAVIRUS 2 (TAT 6-24 HRS): SARS Coronavirus 2: NEGATIVE

## 2021-03-23 LAB — RENAL FUNCTION PANEL
Albumin: 2.8 g/dL — ABNORMAL LOW (ref 3.5–5.0)
Anion gap: 9 (ref 5–15)
BUN: 21 mg/dL (ref 8–23)
CO2: 25 mmol/L (ref 22–32)
Calcium: 8.8 mg/dL — ABNORMAL LOW (ref 8.9–10.3)
Chloride: 103 mmol/L (ref 98–111)
Creatinine, Ser: 1.11 mg/dL (ref 0.61–1.24)
GFR, Estimated: >60 mL/min (ref >60)
Glucose, Bld: 141 mg/dL — ABNORMAL HIGH (ref 70–99)
Phosphorus: 3.6 mg/dL (ref 2.5–4.6)
Potassium: 4.2 mmol/L (ref 3.5–5.1)
Sodium: 137 mmol/L (ref 135–145)

## 2021-03-23 LAB — CBC
HCT: 34.6 % — ABNORMAL LOW (ref 39.0–52.0)
Hemoglobin: 11.6 g/dL — ABNORMAL LOW (ref 13.0–17.0)
MCH: 32.3 pg (ref 26.0–34.0)
MCHC: 33.5 g/dL (ref 30.0–36.0)
MCV: 96.4 fL (ref 80.0–100.0)
Platelets: 151 10*3/uL (ref 150–400)
RBC: 3.59 MIL/uL — ABNORMAL LOW (ref 4.22–5.81)
RDW: 13.2 % (ref 11.5–15.5)
WBC: 11.5 10*3/uL — ABNORMAL HIGH (ref 4.0–10.5)
nRBC: 0 % (ref 0.0–0.2)

## 2021-03-23 LAB — GLUCOSE, CAPILLARY
Glucose-Capillary: 143 mg/dL — ABNORMAL HIGH (ref 70–99)
Glucose-Capillary: 169 mg/dL — ABNORMAL HIGH (ref 70–99)

## 2021-03-23 LAB — MAGNESIUM: Magnesium: 1.7 mg/dL (ref 1.7–2.4)

## 2021-03-23 MED ORDER — MIDODRINE HCL 2.5 MG PO TABS: 2.5000 mg | ORAL_TABLET | Freq: Three times a day (TID) | ORAL | Status: DC

## 2021-03-23 MED ORDER — AMIODARONE HCL 200 MG PO TABS: ORAL_TABLET | ORAL | 0 refills | Status: DC

## 2021-03-23 MED ORDER — CARVEDILOL 3.125 MG PO TABS: 3.1250 mg | ORAL_TABLET | Freq: Two times a day (BID) | ORAL | Status: DC

## 2021-03-23 MED ORDER — MAGNESIUM SULFATE 2 GM/50ML IV SOLN
2.0000 g | Freq: Once | INTRAVENOUS | Status: AC
  Administered 2021-03-23: 2 g via INTRAVENOUS
  Filled 2021-03-23: qty 50

## 2021-03-23 NOTE — Care Management Important Message (Signed)
Important Message  Patient Details  Name: Thomas [name] MRN: [id number] Date of Birth: Apr 28, 1929   Medicare Important Message Given:  Yes     Renie Ora 03/23/2021, 9:59 AM

## 2021-03-23 NOTE — TOC Progression Note (Signed)
Transition of Care [facility]) - Progression Note    Patient Details  Name: Khush Pasion MRN: [id number] Date of Birth: 01-11-1929  [facility] CM/SW Contact  Leone Haven, RN Phone Number: 03/23/2021, 9:22 AM  Clinical Narrative:    NCM spoke with Alinda Money to confirm a bed for patient today, he states yes he has a bed for him today.   Expected Discharge Plan: Skilled Nursing Facility Barriers to Discharge: Continued Medical Work up  Expected Discharge Plan and Services Expected Discharge Plan: Skilled Nursing Facility   Discharge Planning Services: CM Consult Post Acute Care Choice: Skilled Nursing Facility Living arrangements for the past 2 months: Single Family Home Expected Discharge Date: 03/23/21                 DME Agency: NA       HH Arranged: NA           Social Determinants of Health (SDOH) Interventions    Readmission Risk Interventions No flowsheet data found.

## 2021-03-23 NOTE — Consult Note (Signed)
   [facility] Inpatient Consult   03/23/2021  [name] [birth year] [id number]  [place] [facility] [[facility]] Patient: Thomas [name] [facility]  Patient discussed in patient care conference and is going to post facility care for rehab needs. Post hospital needs are to be met at a skilled nursing facility level of care.  For questions or referrals, please contact:   Natividad Brood, RN BSN [facility] Liaison  [phone] business mobile phone Toll free office [phone]  Fax number: [phone] [email or web address]

## 2021-03-23 NOTE — Discharge Summary (Addendum)
Physician Discharge Summary  Thomas [name] GNF:[id number] DOB: [birth year] DOA: 03/16/2021  PCP: Ardith Dark, MD  Admit date: 03/16/2021 Discharge date: 03/23/2021 Admitted From: Home Disposition: SNF Recommendations for Outpatient Follow-up:  Follow ups as below. Please obtain CBC/BMP/Mag in 1 week CMP, TSH and FT4 in 6-8 weeks  Please follow up on the following pending results: None  Discharge Condition: Stable CODE STATUS: Full code except for intubation  Contact information for follow-up providers     Sheilah Pigeon, PA-C. Go on 04/28/2021.   Specialty: Cardiology Why: at 9:45am Contact information: [address] [place] [address] [place] [address] [phone]              Contact information for after-discharge care     Destination     [facility] Preferred SNF .   Service: Skilled Nursing Contact information: [address] [place] [address] [phone]                    Hospital Course: 87 year old M with PMH of CAD/CABG in 2001, DM-2, orthostatic hypotension, PE/clotting disorder on Eliquis, CVA, SSS/PPM, HTN and HLD presenting with generalized weakness and near syncope with heart monitor at home showing a pulse in 30s and 40s.  On arrival to ED, BP 151/41.  HR in 70s to 80s on monitor but with new onset bigeminy.  PPM interrogation without significant events recently.  Patient was admitted and cardiology consulted and following.  EP started on amiodarone and midodrine.  Orthostatic hypotension resolved.  Cleared for discharge by cardiology.    Therapy recommended SNF.  See individual problem list below for more on hospital course.  Discharge Diagnoses:  Near syncope/orthostatic hypotension/bigeminy.  Orthostatic vitals negative today.  But HR at 35 on my exam although EKG rate is at 76 with bigeminy.  Seen by cardiology.  EP has been consulted and gave recommendation.  Orthostatic hypotension seem to  have resolved. -Appreciate care by cardiology and electrophysiology             -Continue Coreg 3.125 mg twice daily             -Continue midodrine 2.5 mg 3 times daily             -EP rec on 9/24-amnio 200 mg BID for 5 days, then 200 mg daily.               -CMP, TSH and free T4 in 6 to 8 weeks -Fall precaution -Outpatient follow-up with cardiology in 4 to 6 weeks  SSS/PPM: Pacemaker interrogation without significant finding.   History of PE/hypercoagulability -Continue Eliquis   Essential hypertension: Normotensive for most part. -Continue Coreg   CAD s/p CABG in 2001: Continue Coreg and statin.   Uncontrolled controlled DM-2 with hyperglycemia: A1c 7.4%.  On Lantus 15 units at home. -Continue Lantus 15 units daily -Continue statin.   Hypothyroidism: TSH within normal -Continue Synthroid. -Check TSH and free T4 in 6 to 8 weeks   Osteoarthritis/left knee pain-exam reassuring.  Uric acid 5.5. -Continue Tylenol and Voltaren gel   Normocytic anemia: Stable. Recent Labs    08/31/20 0059 08/31/20 0101 09/01/20 0337 09/02/20 0405 09/05/20 0403 09/08/20 0222 10/11/20 1335 03/16/21 1721 03/22/21 0253 03/23/21 0231  HGB 11.0* 10.5* 10.4* 10.6* 10.9* 10.2* 10.9* 11.8* 10.8* 11.6*  -Recheck CBC in 1 week  Hypomagnesemia: 1.7 -IV magnesium sulfate 2 g prior to discharge   Thrombocytopenia: Resolved.   Physical deconditioning/debility:  -Continue PT/OT  at SNF.  Body mass index is 25.01 kg/m.           Discharge Exam: Vitals:   03/22/21 0904 03/22/21 1700 03/22/21 1955 03/23/21 0511  BP: (!) 164/64 (!) 171/75 115/69 (!) 133/54  Pulse: 70 70 69 68  Temp: 97.6 F (36.4 C) 98.4 F (36.9 C) 98.5 F (36.9 C) 99.6 F (37.6 C)  Resp: Height:      Weight:    74.6 kg  SpO2: 100% 99% 98% 95%  TempSrc: Oral Oral Oral Oral  BMI (Calculated):    25.01     GENERAL: No apparent distress.  Nontoxic. HEENT: MMM.  Vision and hearing grossly intact.   NECK: Supple.  No apparent JVD.  RESP: 98% on RA.  No IWOB.  Fair aeration bilaterally. CVS: Regular rhythm in 70s. Heart sounds normal.  ABD/GI/GU: Bowel sounds present. Soft. Non tender.  MSK/EXT:  Moves extremities. No apparent deformity. No edema.  SKIN: no apparent skin lesion or wound NEURO: Awake and alert.  Oriented appropriately.  No apparent focal neuro deficit. PSYCH: Calm. Normal affect.   Discharge Instructions  Discharge Instructions     Diet - low sodium heart healthy   Complete by: As directed    Diet Carb Modified   Complete by: As directed    Increase activity slowly   Complete by: As directed       Allergies as of 03/23/2021       Reactions   Amitriptyline Shortness Of Breath, Palpitations   Lyrica [pregabalin] Nausea And Vomiting   Pravachol [pravastatin] Other (See Comments)   Muscle cramps and aches    Effexor Xr [venlafaxine] Anxiety   Morphine Palpitations   Zolpidem Anxiety, Palpitations        Medication List     STOP taking these medications    b complex vitamins capsule       TAKE these medications    acetaminophen 325 MG tablet Commonly known as: TYLENOL Take 650 mg by mouth every 6 (six) hours as needed for moderate pain or headache.   amiodarone 200 MG tablet Commonly known as: PACERONE Take 1 tablet (200 mg total) by mouth 2 (two) times daily for 4 days, THEN 1 tablet (200 mg total) daily. Start taking on: March 23, 2021   apixaban 5 MG Tabs tablet Commonly known as: ELIQUIS Take 1 tablet (5 mg total) by mouth 2 (two) times daily.   carvedilol 3.125 MG tablet Commonly known as: COREG Take 1 tablet (3.125 mg total) by mouth 2 (two) times daily with a meal. What changed:  medication strength how much to take   cholecalciferol 25 MCG (1000 UNIT) tablet Commonly known as: VITAMIN D3 Take 1,000 Units by mouth daily.   co-enzyme Q-10 50 MG capsule Take 50 mg by mouth daily.   ferrous sulfate 325 (65 FE) MG EC  tablet TAKE 1 TABLET(325 MG) BY MOUTH DAILY WITH BREAKFAST What changed: See the new instructions.   glucosamine-chondroitin 500-400 MG tablet Take 1 tablet by mouth 3 (three) times daily.   insulin glargine 100 unit/mL Sopn Commonly known as: LANTUS Inject 15 Units into the skin daily.   Insulin Pen Needle 31G X 5 MM Misc 1 each by Does not apply route daily. Please use a new pen needle daily with Lantus injection   levothyroxine 100 MCG tablet Commonly known as: SYNTHROID TAKE 1 TABLET(100 MCG) BY MOUTH DAILY What changed: See the new instructions.   midodrine 2.5  MG tablet Commonly known as: PROAMATINE Take 1 tablet (2.5 mg total) by mouth 3 (three) times daily with meals.   mirtazapine 7.5 MG tablet Commonly known as: REMERON TAKE 1 TABLET(7.5 MG) BY MOUTH AT BEDTIME What changed: See the new instructions.   multivitamin tablet Take 1 tablet by mouth daily.   RED YEAST RICE PO Take 1 capsule by mouth in the morning and at bedtime.   rosuvastatin 20 MG tablet Commonly known as: CRESTOR Take 1 tablet (20 mg total) by mouth at bedtime.   tamsulosin 0.4 MG Caps capsule Commonly known as: FLOMAX Take 0.4 mg by mouth daily after supper.        Consultations: Cardiology Electrophysiology  Procedures/Studies:   DG Chest Portable 1 View  Result Date: 03/16/2021 CLINICAL DATA:  Weakness bradycardia EXAM: PORTABLE CHEST 1 VIEW COMPARISON:  08/19/2020 FINDINGS: Cardiac shadow is enlarged. Postsurgical changes and pacing device are again noted and stable. The lungs are clear bilaterally. No bony abnormality is seen. Old rib fractures are noted on the left stable from the prior study. IMPRESSION: No active disease. Electronically Signed   By: Alcide Clever M.D.   On: 03/16/2021 18:21   ECHOCARDIOGRAM COMPLETE  Result Date: 03/17/2021    ECHOCARDIOGRAM REPORT   Patient Name:   GAUTAM LANGHORST Date of Exam: 03/17/2021 Medical Rec #:  [id number]       Height:       68.0 in  Accession #:    [id number]      Weight:       163.0 lb Date of Birth:  06-Dec-1928       BSA:          1.874 m Patient Age:    87 years        BP:           162/61 mmHg Patient Gender: M               HR:           72 bpm. Exam Location:  Inpatient Procedure: 2D Echo Indications:    CAD of native vessel  History:        Patient has prior history of Echocardiogram examinations, most                 recent 08/07/2020. Prior CABG and Pacemaker; Risk                 Factors:Hypertension, Dyslipidemia and Diabetes.  Sonographer:    Delcie Roch RDCS Referring Phys: [id number] CHRISTOPHER L SCHUMANN IMPRESSIONS  1. Left ventricular ejection fraction, by estimation, is 60 to 65%. The left ventricle has normal function. The left ventricle has no regional wall motion abnormalities. There is moderate asymmetric left ventricular hypertrophy of the septal segment. Left ventricular diastolic parameters are consistent with Grade I diastolic dysfunction (impaired relaxation).  2. Right ventricular systolic function is normal. The right ventricular size is normal. There is normal pulmonary artery systolic pressure. The estimated right ventricular systolic pressure is 25.8 mmHg.  3. Left atrial size was moderately dilated.  4. The mitral valve is degenerative. Mild mitral valve regurgitation. No evidence of mitral stenosis.  5. The aortic valve is calcified. Aortic valve regurgitation is not visualized. Mild to moderate aortic valve sclerosis/calcification is present, without any evidence of aortic stenosis.  6. Aortic dilatation noted. There is mild dilatation of the aortic root, measuring 38 mm.  7. The inferior vena cava is normal in size with greater than 50%  respiratory variability, suggesting right atrial pressure of 3 mmHg. FINDINGS  Left Ventricle: Left ventricular ejection fraction, by estimation, is 60 to 65%. The left ventricle has normal function. The left ventricle has no regional wall motion abnormalities. The left  ventricular internal cavity size was normal in size. There is  moderate asymmetric left ventricular hypertrophy of the septal segment. Left ventricular diastolic parameters are consistent with Grade I diastolic dysfunction (impaired relaxation). Normal left ventricular filling pressure. Right Ventricle: The right ventricular size is normal. No increase in right ventricular wall thickness. Right ventricular systolic function is normal. There is normal pulmonary artery systolic pressure. The tricuspid regurgitant velocity is 2.39 m/s, and  with an assumed right atrial pressure of 3 mmHg, the estimated right ventricular systolic pressure is 25.8 mmHg. Left Atrium: Left atrial size was moderately dilated. Right Atrium: Right atrial size was normal in size. Pericardium: There is no evidence of pericardial effusion. Mitral Valve: The mitral valve is degenerative in appearance. There is mild thickening of the mitral valve leaflet(s). There is mild calcification of the mitral valve leaflet(s). Mild to moderate mitral annular calcification. Mild mitral valve regurgitation. No evidence of mitral valve stenosis. Tricuspid Valve: The tricuspid valve is normal in structure. Tricuspid valve regurgitation is mild . No evidence of tricuspid stenosis. Aortic Valve: The aortic valve is calcified. Aortic valve regurgitation is not visualized. Mild to moderate aortic valve sclerosis/calcification is present, without any evidence of aortic stenosis. Pulmonic Valve: The pulmonic valve was normal in structure. Pulmonic valve regurgitation is not visualized. No evidence of pulmonic stenosis. Aorta: Aortic dilatation noted. There is mild dilatation of the aortic root, measuring 38 mm. Venous: The inferior vena cava is normal in size with greater than 50% respiratory variability, suggesting right atrial pressure of 3 mmHg. IAS/Shunts: No atrial level shunt detected by color flow Doppler. Additional Comments: A device lead is visualized.   LEFT VENTRICLE PLAX 2D LVIDd:         4.60 cm     Diastology LVIDs:         3.10 cm     LV e' medial:    3.26 cm/s LV PW:         1.00 cm     LV E/e' medial:  15.4 LV IVS:        1.50 cm     LV e' lateral:   6.09 cm/s LVOT diam:     1.90 cm     LV E/e' lateral: 8.2 LV SV:         48 LV SV Index:   26 LVOT Area:     2.84 cm  LV Volumes (MOD) LV vol d, MOD A4C: 79.8 ml LV vol s, MOD A4C: 51.2 ml LV SV MOD A4C:     79.8 ml RIGHT VENTRICLE RV S prime:     7.83 cm/s TAPSE (M-mode): 1.4 cm LEFT ATRIUM             Index       RIGHT ATRIUM           Index LA diam:        4.20 cm 2.24 cm/m  RA Area:     14.90 cm LA Vol (A2C):   80.3 ml 42.86 ml/m RA Volume:   35.60 ml  19.00 ml/m LA Vol (A4C):   75.4 ml 40.24 ml/m LA Biplane Vol: 81.9 ml 43.71 ml/m  AORTIC VALVE LVOT Vmax:   82.70 cm/s LVOT Vmean:  56.600 cm/s LVOT  VTI:    0.170 m  AORTA Ao Root diam: 3.80 cm Ao Asc diam:  3.60 cm MV E velocity: 50.10 cm/s  TRICUSPID VALVE MV A velocity: 92.10 cm/s  TR Peak grad:   22.8 mmHg MV E/A ratio:  0.54        TR Vmax:        239.00 cm/s                             SHUNTS                            Systemic VTI:  0.17 m                            Systemic Diam: 1.90 cm Armanda Magic MD Electronically signed by Armanda Magic MD Signature Date/Time: 03/17/2021/4:59:06 PM    Final        The results of significant diagnostics from this hospitalization (including imaging, microbiology, ancillary and laboratory) are listed below for reference.     Microbiology: Recent Results (from the past 240 hour(s))  Resp Panel by RT-PCR (Flu A&B, Covid)     Status: None   Collection Time: 03/17/21  2:26 PM  Result Value Ref Range Status   SARS Coronavirus 2 by RT PCR NEGATIVE NEGATIVE Final    Comment: (NOTE) SARS-CoV-2 target nucleic acids are NOT DETECTED.  The SARS-CoV-2 RNA is generally detectable in upper respiratory specimens during the acute phase of infection. The lowest concentration of SARS-CoV-2 viral copies this  assay can detect is 138 copies/mL. A negative result does not preclude SARS-Cov-2 infection and should not be used as the sole basis for treatment or other patient management decisions. A negative result may occur with  improper specimen collection/handling, submission of specimen other than nasopharyngeal swab, presence of viral mutation(s) within the areas targeted by this assay, and inadequate number of viral copies(<138 copies/mL). A negative result must be combined with clinical observations, patient history, and epidemiological information. The expected result is Negative.  Fact Sheet for Patients:  BloggerCourse.com  Fact Sheet for Healthcare Providers:  SeriousBroker.it  This test is no t yet approved or cleared by the [place] FDA and  has been authorized for detection and/or diagnosis of SARS-CoV-2 by FDA under an Emergency Use Authorization (EUA). This EUA will remain  in effect (meaning this test can be used) for the duration of the COVID-19 declaration under Section 564(b)(1) of the Act, 21 U.S.C.section 360bbb-3(b)(1), unless the authorization is terminated  or revoked sooner.       Influenza A by PCR NEGATIVE NEGATIVE Final   Influenza B by PCR NEGATIVE NEGATIVE Final    Comment: (NOTE) The Xpert Xpress SARS-CoV-2/FLU/RSV plus assay is intended as an aid in the diagnosis of influenza from Nasopharyngeal swab specimens and should not be used as a sole basis for treatment. Nasal washings and aspirates are unacceptable for Xpert Xpress SARS-CoV-2/FLU/RSV testing.  Fact Sheet for Patients: BloggerCourse.com  Fact Sheet for Healthcare Providers: SeriousBroker.it  This test is not yet approved or cleared by the [place] FDA and has been authorized for detection and/or diagnosis of SARS-CoV-2 by FDA under an Emergency Use Authorization (EUA). This EUA will  remain in effect (meaning this test can be used) for the duration of the COVID-19 declaration under Section 564(b)(1) of the Act, 21 U.S.C.  section 360bbb-3(b)(1), unless the authorization is terminated or revoked.  Performed at [facility], [address] [place], [place] [address]   SARS CORONAVIRUS 2 (TAT 6-24 HRS) Nasopharyngeal Nasopharyngeal Swab     Status: None   Collection Time: 03/22/21 11:48 AM   Specimen: Nasopharyngeal Swab  Result Value Ref Range Status   SARS Coronavirus 2 NEGATIVE NEGATIVE Final    Comment: (NOTE) SARS-CoV-2 target nucleic acids are NOT DETECTED.  The SARS-CoV-2 RNA is generally detectable in upper and lower respiratory specimens during the acute phase of infection. Negative results do not preclude SARS-CoV-2 infection, do not rule out co-infections with other pathogens, and should not be used as the sole basis for treatment or other patient management decisions. Negative results must be combined with clinical observations, patient history, and epidemiological information. The expected result is Negative.  Fact Sheet for Patients: HairSlick.no  Fact Sheet for Healthcare Providers: quierodirigir.com  This test is not yet approved or cleared by the [place] FDA and  has been authorized for detection and/or diagnosis of SARS-CoV-2 by FDA under an Emergency Use Authorization (EUA). This EUA will remain  in effect (meaning this test can be used) for the duration of the COVID-19 declaration under Se ction 564(b)(1) of the Act, 21 U.S.C. section 360bbb-3(b)(1), unless the authorization is terminated or revoked sooner.  Performed at [facility], [address], [place], [place] [address]      Labs:  CBC: Recent Labs  Lab 03/16/21 1721 03/22/21 0253 03/23/21 0231  WBC 8.9 9.8 11.5*  NEUTROABS 4.8  --   --   HGB 11.8* 10.8* 11.6*  HCT 34.9* 32.9* 34.6*  MCV 97.2  96.2 96.4  PLT 156 137* 151   BMP &GFR Recent Labs  Lab 03/16/21 1721 03/17/21 0326 03/18/21 0322 03/22/21 0253 03/23/21 0231  NA 138 138 140 138 137  K 4.0 3.5 3.7 4.1 4.2  CL 105 105 107 106 103  CO2 23 23 23 25 25   GLUCOSE 273* 259* 97 132* 141*  BUN 19 18 13 23 21   CREATININE 1.16 1.04 0.95 1.03 1.11  CALCIUM 8.9 8.8* 8.5* 8.7* 8.8*  MG 1.8 1.9  --  1.6* 1.7  PHOS 3.6  --   --  4.1 3.6   Estimated Creatinine Clearance: 42.8 mL/min (by C-G formula based on SCr of 1.11 mg/dL). Liver & Pancreas: Recent Labs  Lab 03/16/21 1721 03/22/21 0253 03/23/21 0231  AST 29  --   --   ALT 22  --   --   ALKPHOS 47  --   --   BILITOT 0.2*  --   --   PROT 6.2*  --   --   ALBUMIN 3.3* 2.7* 2.8*   No results for input(s): LIPASE, AMYLASE in the last 168 hours. No results for input(s): AMMONIA in the last 168 hours. Diabetic: No results for input(s): HGBA1C in the last 72 hours. Recent Labs  Lab 03/22/21 0635 03/22/21 1228 03/22/21 1753 03/22/21 2110 03/23/21 0602  GLUCAP 131* 170* 122* 166* 143*   Cardiac Enzymes: No results for input(s): CKTOTAL, CKMB, CKMBINDEX, TROPONINI in the last 168 hours. No results for input(s): PROBNP in the last 8760 hours. Coagulation Profile: No results for input(s): INR, PROTIME in the last 168 hours. Thyroid Function Tests: No results for input(s): TSH, T4TOTAL, FREET4, T3FREE, THYROIDAB in the last 72 hours. Lipid Profile: No results for input(s): CHOL, HDL, LDLCALC, TRIG, CHOLHDL, LDLDIRECT in the last 72 hours.  Anemia Panel: No results for input(s): VITAMINB12, FOLATE, FERRITIN, TIBC, IRON, RETICCTPCT in the last 72 hours. Urine analysis:    Component Value Date/Time   COLORURINE YELLOW 08/31/2020 0913   APPEARANCEUR CLEAR 08/31/2020 0913   LABSPEC 1.016 08/31/2020 0913   PHURINE 5.0 08/31/2020 0913   GLUCOSEU 50 (A) 08/31/2020 0913   GLUCOSEU 100 (A) 08/22/2020 1122   HGBUR NEGATIVE 08/31/2020 0913   BILIRUBINUR NEGATIVE  08/31/2020 0913   KETONESUR NEGATIVE 08/31/2020 0913   PROTEINUR NEGATIVE 08/31/2020 0913   UROBILINOGEN 0.2 08/22/2020 1122   NITRITE NEGATIVE 08/31/2020 0913   LEUKOCYTESUR NEGATIVE 08/31/2020 0913   Sepsis Labs: Invalid input(s): PROCALCITONIN, LACTICIDVEN   Time coordinating discharge: 45 minutes  SIGNED:  Almon Hercules, MD  [facility] 03/23/2021, 10:14 AM

## 2021-03-23 NOTE — TOC Transition Note (Signed)
Transition of Care [facility]) - CM/SW Discharge Note   Patient Details  Name: Thomas [name] MRN: [id number] Date of Birth: 04-04-1929  [facility] CM/SW Contact:  Lynett Grimes Phone Number: 03/23/2021, 10:30 AM   Clinical Narrative:    Patient will DC to: [name] Anticipated DC date: 03/23/2021 Family notified: Spouse Transport by: Sharin Mons   Per MD patient ready for DC to [facility] room 3251. RN to call report prior to discharge [phone]). RN, patient, patient's family, and facility notified of DC. Discharge Summary and FL2 sent to facility. DC packet on chart. Ambulance transport requested for patient.   CSW will sign off for now as social work intervention is no longer needed. Please consult [place] again if new needs arise.       Barriers to Discharge: Continued Medical Work up   Patient Goals and CMS Choice Patient states their goals for this hospitalization and ongoing recovery are:: to go to SNF      Discharge Placement                       Discharge Plan and Services   Discharge Planning Services: CM Consult Post Acute Care Choice: Skilled Nursing Facility            DME Agency: NA       HH Arranged: NA          Social Determinants of Health (SDOH) Interventions     Readmission Risk Interventions No flowsheet data found.

## 2021-03-24 DIAGNOSIS — E119 Type 2 diabetes mellitus without complications: Secondary | ICD-10-CM | POA: Diagnosis not present

## 2021-03-24 DIAGNOSIS — D649 Anemia, unspecified: Secondary | ICD-10-CM | POA: Diagnosis not present

## 2021-03-24 DIAGNOSIS — E039 Hypothyroidism, unspecified: Secondary | ICD-10-CM | POA: Diagnosis not present

## 2021-03-24 DIAGNOSIS — E785 Hyperlipidemia, unspecified: Secondary | ICD-10-CM | POA: Diagnosis not present

## 2021-03-26 DIAGNOSIS — I10 Essential (primary) hypertension: Secondary | ICD-10-CM | POA: Diagnosis not present

## 2021-03-26 DIAGNOSIS — M1990 Unspecified osteoarthritis, unspecified site: Secondary | ICD-10-CM | POA: Diagnosis not present

## 2021-03-26 DIAGNOSIS — I495 Sick sinus syndrome: Secondary | ICD-10-CM | POA: Diagnosis not present

## 2021-03-26 DIAGNOSIS — Z950 Presence of cardiac pacemaker: Secondary | ICD-10-CM | POA: Diagnosis not present

## 2021-03-27 DIAGNOSIS — E1165 Type 2 diabetes mellitus with hyperglycemia: Secondary | ICD-10-CM | POA: Diagnosis not present

## 2021-03-27 DIAGNOSIS — I2510 Atherosclerotic heart disease of native coronary artery without angina pectoris: Secondary | ICD-10-CM | POA: Diagnosis not present

## 2021-03-27 DIAGNOSIS — I152 Hypertension secondary to endocrine disorders: Secondary | ICD-10-CM

## 2021-03-27 DIAGNOSIS — R531 Weakness: Secondary | ICD-10-CM | POA: Diagnosis not present

## 2021-03-27 DIAGNOSIS — E1159 Type 2 diabetes mellitus with other circulatory complications: Secondary | ICD-10-CM | POA: Diagnosis not present

## 2021-03-27 DIAGNOSIS — E039 Hypothyroidism, unspecified: Secondary | ICD-10-CM | POA: Diagnosis not present

## 2021-03-27 DIAGNOSIS — R55 Syncope and collapse: Secondary | ICD-10-CM | POA: Diagnosis not present

## 2021-03-27 DIAGNOSIS — R269 Unspecified abnormalities of gait and mobility: Secondary | ICD-10-CM | POA: Diagnosis not present

## 2021-04-02 ENCOUNTER — Encounter: Payer: Self-pay | Admitting: Family Medicine

## 2021-04-02 ENCOUNTER — Ambulatory Visit: Payer: Medicare Other | Admitting: Family Medicine

## 2021-04-02 DIAGNOSIS — Z950 Presence of cardiac pacemaker: Secondary | ICD-10-CM | POA: Diagnosis not present

## 2021-04-02 DIAGNOSIS — I495 Sick sinus syndrome: Secondary | ICD-10-CM | POA: Diagnosis not present

## 2021-04-02 DIAGNOSIS — I10 Essential (primary) hypertension: Secondary | ICD-10-CM | POA: Diagnosis not present

## 2021-04-02 DIAGNOSIS — I951 Orthostatic hypotension: Secondary | ICD-10-CM | POA: Diagnosis not present

## 2021-04-03 ENCOUNTER — Other Ambulatory Visit: Payer: Self-pay

## 2021-04-03 ENCOUNTER — Emergency Department (HOSPITAL_COMMUNITY)
Admission: EM | Admit: 2021-04-03 | Discharge: 2021-04-04 | Disposition: A | Discharge status: Home / Self Care | Payer: Medicare Other | Attending: Emergency Medicine | Admitting: Emergency Medicine

## 2021-04-03 ENCOUNTER — Emergency Department (HOSPITAL_COMMUNITY): Payer: Medicare Other

## 2021-04-03 DIAGNOSIS — M25551 Pain in right hip: Secondary | ICD-10-CM | POA: Insufficient documentation

## 2021-04-03 DIAGNOSIS — Z7901 Long term (current) use of anticoagulants: Secondary | ICD-10-CM | POA: Diagnosis not present

## 2021-04-03 DIAGNOSIS — I10 Essential (primary) hypertension: Secondary | ICD-10-CM | POA: Insufficient documentation

## 2021-04-03 DIAGNOSIS — Z87891 Personal history of nicotine dependence: Secondary | ICD-10-CM | POA: Diagnosis not present

## 2021-04-03 DIAGNOSIS — Z950 Presence of cardiac pacemaker: Secondary | ICD-10-CM | POA: Insufficient documentation

## 2021-04-03 DIAGNOSIS — Z955 Presence of coronary angioplasty implant and graft: Secondary | ICD-10-CM | POA: Insufficient documentation

## 2021-04-03 DIAGNOSIS — M545 Low back pain, unspecified: Secondary | ICD-10-CM | POA: Insufficient documentation

## 2021-04-03 DIAGNOSIS — Z794 Long term (current) use of insulin: Secondary | ICD-10-CM | POA: Insufficient documentation

## 2021-04-03 DIAGNOSIS — M25572 Pain in left ankle and joints of left foot: Secondary | ICD-10-CM | POA: Diagnosis not present

## 2021-04-03 DIAGNOSIS — M25559 Pain in unspecified hip: Secondary | ICD-10-CM

## 2021-04-03 DIAGNOSIS — E039 Hypothyroidism, unspecified: Secondary | ICD-10-CM | POA: Diagnosis not present

## 2021-04-03 DIAGNOSIS — M25552 Pain in left hip: Secondary | ICD-10-CM | POA: Diagnosis not present

## 2021-04-03 DIAGNOSIS — Z79899 Other long term (current) drug therapy: Secondary | ICD-10-CM | POA: Diagnosis not present

## 2021-04-03 DIAGNOSIS — I2510 Atherosclerotic heart disease of native coronary artery without angina pectoris: Secondary | ICD-10-CM | POA: Insufficient documentation

## 2021-04-03 DIAGNOSIS — E119 Type 2 diabetes mellitus without complications: Secondary | ICD-10-CM | POA: Diagnosis not present

## 2021-04-03 DIAGNOSIS — W19XXXA Unspecified fall, initial encounter: Secondary | ICD-10-CM | POA: Diagnosis not present

## 2021-04-03 DIAGNOSIS — R404 Transient alteration of awareness: Secondary | ICD-10-CM | POA: Diagnosis not present

## 2021-04-03 LAB — BASIC METABOLIC PANEL
Anion gap: 9 (ref 5–15)
BUN: 12 mg/dL (ref 8–23)
CO2: 25 mmol/L (ref 22–32)
Calcium: 9 mg/dL (ref 8.9–10.3)
Chloride: 104 mmol/L (ref 98–111)
Creatinine, Ser: 0.98 mg/dL (ref 0.61–1.24)
GFR, Estimated: >60 mL/min (ref >60)
Glucose, Bld: 149 mg/dL — ABNORMAL HIGH (ref 70–99)
Potassium: 4.2 mmol/L (ref 3.5–5.1)
Sodium: 138 mmol/L (ref 135–145)

## 2021-04-03 LAB — CBC WITH DIFFERENTIAL/PLATELET
Abs Immature Granulocytes: 0.05 10*3/uL (ref 0.00–0.07)
Basophils Absolute: 0 10*3/uL (ref 0.0–0.1)
Basophils Relative: 0 %
Eosinophils Absolute: 0.2 10*3/uL (ref 0.0–0.5)
Eosinophils Relative: 2 %
HCT: 35.4 % — ABNORMAL LOW (ref 39.0–52.0)
Hemoglobin: 11.6 g/dL — ABNORMAL LOW (ref 13.0–17.0)
Immature Granulocytes: 1 %
Lymphocytes Relative: 30 %
Lymphs Abs: 3.1 10*3/uL (ref 0.7–4.0)
MCH: 31.7 pg (ref 26.0–34.0)
MCHC: 32.8 g/dL (ref 30.0–36.0)
MCV: 96.7 fL (ref 80.0–100.0)
Monocytes Absolute: 0.8 10*3/uL (ref 0.1–1.0)
Monocytes Relative: 8 %
Neutro Abs: 6.2 10*3/uL (ref 1.7–7.7)
Neutrophils Relative %: 59 %
Platelets: 229 10*3/uL (ref 150–400)
RBC: 3.66 MIL/uL — ABNORMAL LOW (ref 4.22–5.81)
RDW: 12.3 % (ref 11.5–15.5)
WBC: 10.3 10*3/uL (ref 4.0–10.5)
nRBC: 0 % (ref 0.0–0.2)

## 2021-04-03 MED ORDER — ACETAMINOPHEN 325 MG PO TABS
650.0000 mg | ORAL_TABLET | Freq: Once | ORAL | Status: AC
  Administered 2021-04-03: 650 mg via ORAL
  Filled 2021-04-03: qty 2

## 2021-04-03 MED ORDER — CYCLOBENZAPRINE HCL 10 MG PO TABS
5.0000 mg | ORAL_TABLET | Freq: Once | ORAL | Status: AC
  Administered 2021-04-03: 5 mg via ORAL
  Filled 2021-04-03: qty 1

## 2021-04-03 MED ORDER — CYCLOBENZAPRINE HCL 5 MG PO TABS: 10.0000 mg | ORAL_TABLET | Freq: Two times a day (BID) | ORAL | 0 refills | Status: DC | PRN

## 2021-04-03 MED ORDER — HYDROCODONE-ACETAMINOPHEN 5-325 MG PO TABS
1.0000 | ORAL_TABLET | Freq: Once | ORAL | Status: AC
  Administered 2021-04-03: 1 via ORAL
  Filled 2021-04-03: qty 1

## 2021-04-03 NOTE — ED Provider Notes (Signed)
[facility] EMERGENCY DEPARTMENT Provider Note   CSN: [id number] Arrival date & time: 04/03/21  1113     History Chief Complaint  Patient presents with   Hip Pain    Thomas [name] is a 87 y.o. male.  The history is provided by the patient.  Hip Pain This is a chronic problem. The problem occurs every several days. Pertinent negatives include no chest pain, no abdominal pain, no headaches and no shortness of breath. Exacerbated by: movement. The symptoms are relieved by narcotics (rest). Treatments tried: got narcotic in triage and feeling better. The treatment provided significant relief.      Past Medical History:  Diagnosis Date   Arthritis    Clotting disorder ([name])    Coronary artery disease    Diabetes ([name])    Hyperlipidemia    Hypertension    Hypothyroidism 07/17/2020   Kidney disease    Loss of hearing    Macular degeneration    Myocardial infarction [facility])    Osteoarthritis    Stroke [facility])     Patient Active Problem List   Diagnosis Date Noted   Near syncope 03/16/2021   Clotting disorder ([name]) 03/16/2021   Orthostatic hypotension 10/01/2020   TIA (transient ischemic attack) 08/31/2020   Normocytic anemia 08/31/2020   Prolonged QT interval 08/31/2020   Acute CVA (cerebrovascular accident) ([name]) 08/31/2020   Debility 08/21/2020   Lytic bone lesion of femur 08/21/2020   Sick sinus syndrome ([name]) 07/31/2020   Coronary artery disease s/p CABG 2001 07/17/2020   Hypothyroidism 07/17/2020   Venous thromboembolism (VTE) 07/17/2020   Hypertension 07/17/2020   Cardiac pacemaker 07/17/2020   Osteoarthritis 07/17/2020   Type 2 diabetes mellitus with hyperglycemia ([name]) 07/17/2020   Vitamin D deficiency 07/17/2020   Macular degeneration 07/17/2020   Dyslipidemia due to type 2 diabetes mellitus ([name]) 07/17/2020   Insomnia 07/17/2020   BPH (benign prostatic hyperplasia) 07/17/2020   Colon polyps 07/17/2020    Past Surgical History:   Procedure Laterality Date   CARPAL TUNNEL RELEASE  2011   CORONARY ANGIOPLASTY WITH STENT PLACEMENT     CORONARY ARTERY BYPASS GRAFT  2001   x6   REPLACEMENT TOTAL KNEE     SPINE SURGERY  2008   cervical and lumbar   THYROIDECTOMY         Family History  Problem Relation Age of Onset   Stroke Mother    Stroke Father    Heart disease Sister    Cancer Daughter    Hypertension Son    Heart disease Brother    Cancer Brother    Heart disease Brother     Social History   Tobacco Use   Smoking status: Former    Types: Pipe   Smokeless tobacco: Never  Substance Use Topics   Alcohol use: Never   Drug use: Never    Home Medications Prior to Admission medications   Medication Sig Start Date End Date Taking? Authorizing Provider  cyclobenzaprine (FLEXERIL) 5 MG tablet Take 2 tablets (10 mg total) by mouth 2 (two) times daily as needed for up to 20 doses for muscle spasms. 04/03/21  Yes Dyana Magner, DO  acetaminophen (TYLENOL) 325 MG tablet Take 650 mg by mouth every 6 (six) hours as needed for moderate pain or headache.    [provider]  amiodarone (PACERONE) 200 MG tablet Take 1 tablet (200 mg total) by mouth 2 (two) times daily for 4 days, THEN 1 tablet (200 mg total) daily.  03/23/21 06/25/21  Almon Hercules, MD  apixaban (ELIQUIS) 5 MG TABS tablet Take 1 tablet (5 mg total) by mouth 2 (two) times daily. 11/27/20   Meriam Sprague, MD  carvedilol (COREG) 3.125 MG tablet Take 1 tablet (3.125 mg total) by mouth 2 (two) times daily with a meal. 03/23/21   Almon Hercules, MD  cholecalciferol (VITAMIN D3) 25 MCG (1000 UNIT) tablet Take 1,000 Units by mouth daily.    [provider]  co-enzyme Q-10 50 MG capsule Take 50 mg by mouth daily.    [provider]  ferrous sulfate 325 (65 FE) MG EC tablet TAKE 1 TABLET(325 MG) BY MOUTH DAILY WITH BREAKFAST Patient taking differently: Take 325 mg by mouth daily with breakfast. 03/13/21   Jaci Standard, MD   glucosamine-chondroitin 500-400 MG tablet Take 1 tablet by mouth 3 (three) times daily.    [provider]  insulin glargine (LANTUS) 100 unit/mL SOPN Inject 15 Units into the skin daily. 10/01/20   Ardith Dark, MD  Insulin Pen Needle 31G X 5 MM MISC 1 each by Does not apply route daily. Please use a new pen needle daily with Lantus injection 10/03/20   Ardith Dark, MD  levothyroxine (SYNTHROID) 100 MCG tablet TAKE 1 TABLET(100 MCG) BY MOUTH DAILY Patient taking differently: Take 100 mcg by mouth daily before breakfast. 02/23/21   Ardith Dark, MD  midodrine (PROAMATINE) 2.5 MG tablet Take 1 tablet (2.5 mg total) by mouth 3 (three) times daily with meals. 03/23/21   Almon Hercules, MD  mirtazapine (REMERON) 7.5 MG tablet TAKE 1 TABLET(7.5 MG) BY MOUTH AT BEDTIME 03/20/21   Ardith Dark, MD  Multiple Vitamin (MULTIVITAMIN) tablet Take 1 tablet by mouth daily.    [provider]  Red Yeast Rice Extract (RED YEAST RICE PO) Take 1 capsule by mouth in the morning and at bedtime.    [provider]  rosuvastatin (CRESTOR) 20 MG tablet Take 1 tablet (20 mg total) by mouth at bedtime. 10/20/20   Meriam Sprague, MD  tamsulosin (FLOMAX) 0.4 MG CAPS capsule Take 0.4 mg by mouth daily after supper. 07/11/20   [provider]    Allergies    Amitriptyline, Lyrica [pregabalin], Pravachol [pravastatin], Effexor xr [venlafaxine], Morphine, and Zolpidem  Review of Systems   Review of Systems  Constitutional:  Negative for chills and fever.  Respiratory:  Negative for shortness of breath.   Cardiovascular:  Negative for chest pain and palpitations.  Gastrointestinal:  Negative for abdominal pain and vomiting.  Genitourinary:  Negative for decreased urine volume, dysuria, hematuria and urgency.  Musculoskeletal:  Positive for arthralgias, back pain and gait problem. Negative for joint swelling, myalgias, neck pain and neck stiffness.  Skin:  Negative for color  change and rash.  Neurological:  Negative for seizures, syncope, weakness, numbness and headaches.  All other systems reviewed and are negative.  Physical Exam Updated Vital Signs BP (!) 175/73 (BP Location: Right Arm)   Pulse 69   Temp 98.3 F (36.8 C) (Oral)   Resp 16   Ht 5\' 8"  (1.727 m)   Wt 72.6 kg   SpO2 96%   BMI 24.33 kg/m   Physical Exam Vitals and nursing note reviewed.  Constitutional:      General: He is not in acute distress.    Appearance: He is well-developed. He is not ill-appearing.  HENT:     Head: Normocephalic and atraumatic.  Eyes:  Extraocular Movements: Extraocular movements intact.     Pupils: Pupils are equal, round, and reactive to light.  Cardiovascular:     Rate and Rhythm: Normal rate and regular rhythm.     Pulses: Normal pulses.     Heart sounds: Normal heart sounds. No murmur heard. Pulmonary:     Effort: Pulmonary effort is normal. No respiratory distress.     Breath sounds: Normal breath sounds.  Musculoskeletal:        General: Tenderness present.     Cervical back: Neck supple. No tenderness.     Comments: No midline spinal tenderness, tenderness to bilateral hips but does have good range of motion of both lower extremities without much pain or discomfort, does have some increased tone to bilateral paraspinal lumbar muscles  Skin:    General: Skin is warm and dry.     Capillary Refill: Capillary refill takes less than 2 seconds.  Neurological:     General: No focal deficit present.     Mental Status: He is alert and oriented to person, place, and time.     Cranial Nerves: No cranial nerve deficit.     Sensory: No sensory deficit.     Motor: No weakness.     Comments: 5+ out of 5 strength in bilateral lower extremities, normal sensation    ED Results / Procedures / Treatments   Labs (all labs ordered are listed, but only abnormal results are displayed) Labs Reviewed  BASIC METABOLIC PANEL - Abnormal; Notable for the following  components:      Result Value   Glucose, Bld 149 (*)    All other components within normal limits  CBC WITH DIFFERENTIAL/PLATELET - Abnormal; Notable for the following components:   RBC 3.66 (*)    Hemoglobin 11.6 (*)    HCT 35.4 (*)    All other components within normal limits    EKG None  Radiology DG Lumbar Spine Complete  Result Date: 04/03/2021 CLINICAL DATA:  Back pain, right hip pain EXAM: LUMBAR SPINE - COMPLETE 4+ VIEW COMPARISON:  None. FINDINGS: Frontal, bilateral oblique, lateral views of the lumbar spine are obtained. There are 5 non-rib-bearing lumbar type vertebral bodies with mild left convex scoliosis centered at L3. Otherwise alignment is anatomic. There are no acute fractures. There is diffuse spondylosis and facet hypertrophy, greatest at L4-5 and L5-S1. Sacroiliac joints are normal. IMPRESSION: 1. Multilevel spondylosis and facet hypertrophy greatest at the lumbosacral junction. 2. No acute bony abnormality. 3. Mild left convex scoliosis. Electronically Signed   By: Sharlet Salina M.D.   On: 04/03/2021 19:32   DG Hip Unilat W or Wo Pelvis 2-3 Views Left  Result Date: 04/03/2021 CLINICAL DATA:  Worsening chronic hip pain EXAM: DG HIP (WITH OR WITHOUT PELVIS) 2-3V LEFT; DG HIP (WITH OR WITHOUT PELVIS) 2-3V RIGHT COMPARISON:  10/17/2020 FINDINGS: Frontal view of the pelvis as well as frontal and frogleg lateral views of both hips are obtained. On the left, there is no fracture, subluxation, or dislocation. Mild joint space narrowing and osteophyte formation compatible with osteoarthritis. Severe atherosclerosis. On the right, no fracture, subluxation, or dislocation. Mild joint space narrowing and osteophyte formation compatible with osteoarthritis. Severe atherosclerosis. There is prominent lower lumbar spondylosis and facet hypertrophy incidentally noted. The sacroiliac joints are unremarkable. IMPRESSION: 1. Mild symmetrical bilateral hip osteoarthritis. No acute bony  abnormality. Electronically Signed   By: Sharlet Salina M.D.   On: 04/03/2021 19:31   DG Hip Unilat W or Wo Pelvis 2-3  Views Right  Result Date: 04/03/2021 CLINICAL DATA:  Worsening chronic hip pain EXAM: DG HIP (WITH OR WITHOUT PELVIS) 2-3V LEFT; DG HIP (WITH OR WITHOUT PELVIS) 2-3V RIGHT COMPARISON:  10/17/2020 FINDINGS: Frontal view of the pelvis as well as frontal and frogleg lateral views of both hips are obtained. On the left, there is no fracture, subluxation, or dislocation. Mild joint space narrowing and osteophyte formation compatible with osteoarthritis. Severe atherosclerosis. On the right, no fracture, subluxation, or dislocation. Mild joint space narrowing and osteophyte formation compatible with osteoarthritis. Severe atherosclerosis. There is prominent lower lumbar spondylosis and facet hypertrophy incidentally noted. The sacroiliac joints are unremarkable. IMPRESSION: 1. Mild symmetrical bilateral hip osteoarthritis. No acute bony abnormality. Electronically Signed   By: Sharlet Salina M.D.   On: 04/03/2021 19:31    Procedures Procedures   Medications Ordered in ED Medications  HYDROcodone-acetaminophen (NORCO/VICODIN) 5-325 MG per tablet 1 tablet (1 tablet Oral Given 04/03/21 1539)  cyclobenzaprine (FLEXERIL) tablet 5 mg (5 mg Oral Given 04/03/21 2112)  acetaminophen (TYLENOL) tablet 650 mg (650 mg Oral Given 04/03/21 2112)    ED Course  I have reviewed the triage vital signs and the nursing notes.  Pertinent labs & imaging results that were available during my care of the patient were reviewed by me and considered in my medical decision making (see chart for details).    MDM Rules/Calculators/A&P                           Benecio Kluger is a 87 year old male with history of CAD, diabetes, CKD who presents the ED with low back pain, bilateral hip pain.  Patient uses a walker and wheelchair at baseline.  Overall normal vitals.  No fever.  No symptoms to suggest cauda equina.   Lab work and imaging done prior to my evaluation.  No significant anemia, electrolyte abnormality, kidney injury.  X-ray of the hip shows bilateral hip osteoarthritis.  No fractures.  X-rays of the back are unremarkable.  Overall seems to have arthritic type pain/muscle spasms on exam.  Neurovascular neuromuscularly intact otherwise.  Received narcotic pain medicine in triage and feels much better.  Will give Flexeril and Tylenol.  Patient recently moved here does not follow with orthopedics.  We will have him follow-up with orthopedics for possibly further pain management/injections.  Patient ready has good support at home a wheelchair.  Feeling comfortable and discharged in ED in good condition.  Family made aware.  This chart was dictated using voice recognition software.  Despite best efforts to proofread,  errors can occur which can change the documentation meaning.   Final Clinical Impression(s) / ED Diagnoses Final diagnoses:  Hip pain    Rx / DC Orders ED Discharge Orders          Ordered    cyclobenzaprine (FLEXERIL) 5 MG tablet  2 times daily PRN        04/03/21 2212             Virgina Norfolk, DO 04/04/21 0002

## 2021-04-03 NOTE — ED Triage Notes (Signed)
Pt here with worsening chronic hip to right hip. 10/10 with movement. Lying still rates 2/10. Pt has recent MRI which showed lesions on both hips. Pt unable to ambulate with pain. No deformities. VSS. 124/62, hr 70, rr22, 97% RA, 98.5 T. Pt slightly confused for EMS but cooperative, follows commands, HOH.

## 2021-04-03 NOTE — Telephone Encounter (Signed)
See note

## 2021-04-03 NOTE — ED Provider Notes (Signed)
Emergency Medicine Provider Triage Evaluation Note  Nasario Czerniak , a 87 y.o. male  was evaluated in triage.  Pt complains of severe right sided back and hip pain radiating down the front of his leg.  Patient states that his pain is severe right now but it is even worse when he tries to stand up or ambulate.  He is unable to ambulate due to the pain.  He denies weakness in the lower extremities, new incontinence..  Review of Systems  Positive: Back and leg pain Negative: Fever or chills, loss of continence.  Physical Exam  Ht 5\' 8"  (1.727 m)   Wt 72.6 kg   BMI 24.33 kg/m  Gen:   Awake, no distress   Resp:  Normal effort  MSK:   Moves extremities without difficulty  Other:  Pain with internal and external rotation of the hip.  Normal DP/PT pulse.  Medical Decision Making  Medically screening exam initiated at 11:38 AM.  Appropriate orders placed.  Tavian Callander was informed that the remainder of the evaluation will be completed by another provider, this initial triage assessment does not replace that evaluation, and the importance of remaining in the ED until their evaluation is complete.  Patient here with back and hip pain, pain medication ordered.  Imaging ordered.   [name], PA-C 04/03/21 1143    06/03/21, MD 04/03/21 [phone]

## 2021-04-04 DIAGNOSIS — I10 Essential (primary) hypertension: Secondary | ICD-10-CM | POA: Diagnosis not present

## 2021-04-04 DIAGNOSIS — Z743 Need for continuous supervision: Secondary | ICD-10-CM | POA: Diagnosis not present

## 2021-04-04 NOTE — ED Notes (Signed)
PTAR CALLED 9th on the list

## 2021-04-09 ENCOUNTER — Encounter: Payer: Self-pay | Admitting: Family Medicine

## 2021-04-09 ENCOUNTER — Ambulatory Visit (INDEPENDENT_AMBULATORY_CARE_PROVIDER_SITE_OTHER): Payer: Medicare Other | Admitting: Family Medicine

## 2021-04-09 ENCOUNTER — Other Ambulatory Visit: Payer: Self-pay

## 2021-04-09 ENCOUNTER — Other Ambulatory Visit: Payer: Self-pay | Admitting: *Deleted

## 2021-04-09 VITALS — BP 115/65 | HR 74 | Temp 98.1°F | Ht 68.0 in

## 2021-04-09 DIAGNOSIS — E1165 Type 2 diabetes mellitus with hyperglycemia: Secondary | ICD-10-CM | POA: Diagnosis not present

## 2021-04-09 DIAGNOSIS — R5381 Other malaise: Secondary | ICD-10-CM

## 2021-04-09 DIAGNOSIS — Z794 Long term (current) use of insulin: Secondary | ICD-10-CM

## 2021-04-09 DIAGNOSIS — M25559 Pain in unspecified hip: Secondary | ICD-10-CM

## 2021-04-09 DIAGNOSIS — I639 Cerebral infarction, unspecified: Secondary | ICD-10-CM

## 2021-04-09 DIAGNOSIS — I951 Orthostatic hypotension: Secondary | ICD-10-CM

## 2021-04-09 DIAGNOSIS — I10 Essential (primary) hypertension: Secondary | ICD-10-CM | POA: Diagnosis not present

## 2021-04-09 DIAGNOSIS — I495 Sick sinus syndrome: Secondary | ICD-10-CM | POA: Diagnosis not present

## 2021-04-09 NOTE — Assessment & Plan Note (Signed)
Will place referral to [facility].  Has had a couple of falls recently as well as multiple ED visits and admissions.

## 2021-04-09 NOTE — Patient Instructions (Signed)
It was very nice to see you today!  We will give you your flu shot today.  Will place referral for [facility] to come out and assist with home health.  No medication changes today.  We will see back in 3 months.  Please come back to see [place] sooner if needed.  Take care, Dr [name]  PLEASE NOTE:  If you had any lab tests please let us know if you have not heard back within a few days. You may see your results on mychart before we have a chance to review them but we will give you a call once they are reviewed by [place]. If we ordered any referrals today, please let us know if you have not heard from their office within the next week.   Please try these tips to maintain a healthy lifestyle:  Eat at least 3 REAL meals and 1-2 snacks per day.  Aim for no more than 5 hours between eating.  If you eat breakfast, please do so within one hour of getting up.   Each meal should contain half fruits/vegetables, one quarter protein, and one quarter carbs (no bigger than a computer mouse)  Cut down on sweet beverages. This includes juice, soda, and sweet tea.   Drink at least 1 glass of water with each meal and aim for at least 8 glasses per day  Exercise at least 150 minutes every week.

## 2021-04-09 NOTE — Assessment & Plan Note (Signed)
At goal today.  Would like to avoid symptomatic lows and orthostatic hypotension due to his history of syncopal episodes and recurrent falls.  He is currently on both Coreg and midodrine as was prescribed by cardiology in the hospital.  This seems to keep him well controlled.  Continue current regimen and he will follow-up with cardiology in a few weeks.

## 2021-04-09 NOTE — Assessment & Plan Note (Signed)
Still with residual right-sided weakness.  Likely contributing to recent falls.  Will place referral to [facility].

## 2021-04-09 NOTE — Assessment & Plan Note (Signed)
Follow up with cardiology

## 2021-04-09 NOTE — Telephone Encounter (Signed)
Referral placed.

## 2021-04-09 NOTE — Progress Notes (Signed)
Thomas [name] is a 87 y.o. male who presents today for an office visit.  Assessment/Plan:  Chronic Problems Addressed Today: Orthostatic hypotension Doing better on combination of midodrine and carvedilol.  While this is an atypical comb he seems to be doing better with this regimen with less labile blood pressures.  We will defer further management to cardiology.  He will be following up again in a week or so.  Sick sinus syndrome [name]) Follow-up with cardiology.  Debility Will place referral to [facility].  Has had a couple of falls recently as well as multiple ED visits and admissions.  Type 2 diabetes mellitus with hyperglycemia ([name]) A1c in the hospital 7.7.  Continue Lantus 15 units daily.  Recheck in 3 months.  Hypertension At goal today.  Would like to avoid symptomatic lows and orthostatic hypotension due to his history of syncopal episodes and recurrent falls.  He is currently on both Coreg and midodrine as was prescribed by cardiology in the hospital.  This seems to keep him well controlled.  Continue current regimen and he will follow-up with cardiology in a few weeks.  Acute CVA (cerebrovascular accident) ([name]) Still with residual right-sided weakness.  Likely contributing to recent falls.  Will place referral to [facility].  Flu shot given today.     Subjective:  HPI:  He is here for follow up.  Since our last visit he has had multiple ED visits as well as hospital admission.  He presented to ED on 03/16/2021 for generalized weakness and near syncope He had similar issue before. Labs and imaging was done. Found to have ventricular bigeminy. He was admitted.  Cardiology was consulted.  Electrophysiology started patient on amiodarone and midodrine.  Symptoms improved.  He is cleared for discharge.   Over the couple of few weeks after this he had issue with hip pain. He was  admitted to the ED on 04/03/2021 for hip pain. Work up was done including x-rays and labs. X-rays shows  osteoarthritis. He was also given Tynelol and Flexeril.  Was advised to follow up with orthopedics and was discharged home in stable condition.   Today, he is accompanied with his son. He is on the wheelchair. He is doing well since being home. He have one episode of fall. He was able to ambulate around his house. He reports pain is still present in the hip area. However, it is better than before.  See A/p for status of chronic conditions.        Objective:  Physical Exam: BP 115/65   Pulse 74   Temp 98.1 F (36.7 C) (Temporal)   Ht 5\' 8"  (1.727 m)   SpO2 99%   BMI 24.33 kg/m   Gen: No acute distress, resting comfortably CV: Regular rate and rhythm with no murmurs appreciated Pulm: Normal work of breathing, clear to auscultation bilaterally with no crackles, wheezes, or rhonchi Neuro: Grossly normal, moves all extremities Psych: Normal affect and thought content       I,Savera Zaman,acting as a scribe for , MD.,have documented all relevant documentation on the behalf of [name], MD,as directed by  [name], MD while in the presence of [name], MD.   I, [name], MD, have reviewed all documentation for this visit. The documentation on 04/09/21 for the exam, diagnosis, procedures, and orders are all accurate and complete.    Time Spent: 45 minutes of total time was spent on the date of the encounter performing the following actions: chart  review prior to seeing the patient including recent hospitalization and ED visit, obtaining history, performing a medically necessary exam, counseling on the treatment plan, placing orders, and documenting in our EHR.   Katina Degree. [name], MD 04/09/2021 11:26 AM

## 2021-04-09 NOTE — Assessment & Plan Note (Signed)
Doing better on combination of midodrine and carvedilol.  While this is an atypical comb he seems to be doing better with this regimen with less labile blood pressures.  We will defer further management to cardiology.  He will be following up again in a week or so.

## 2021-04-09 NOTE — Assessment & Plan Note (Signed)
A1c in the hospital 7.7.  Continue Lantus 15 units daily.  Recheck in 3 months.

## 2021-04-13 DIAGNOSIS — H353231 Exudative age-related macular degeneration, bilateral, with active choroidal neovascularization: Secondary | ICD-10-CM | POA: Diagnosis not present

## 2021-04-14 ENCOUNTER — Ambulatory Visit (INDEPENDENT_AMBULATORY_CARE_PROVIDER_SITE_OTHER): Payer: Medicare Other | Admitting: Family

## 2021-04-14 ENCOUNTER — Other Ambulatory Visit: Payer: Self-pay

## 2021-04-14 ENCOUNTER — Telehealth: Payer: Self-pay

## 2021-04-14 ENCOUNTER — Encounter (HOSPITAL_BASED_OUTPATIENT_CLINIC_OR_DEPARTMENT_OTHER): Payer: Self-pay | Admitting: Family

## 2021-04-14 VITALS — BP 140/68 | HR 74 | Ht 68.0 in

## 2021-04-14 DIAGNOSIS — Z86711 Personal history of pulmonary embolism: Secondary | ICD-10-CM

## 2021-04-14 DIAGNOSIS — I25118 Atherosclerotic heart disease of native coronary artery with other forms of angina pectoris: Secondary | ICD-10-CM

## 2021-04-14 DIAGNOSIS — I951 Orthostatic hypotension: Secondary | ICD-10-CM

## 2021-04-14 DIAGNOSIS — E785 Hyperlipidemia, unspecified: Secondary | ICD-10-CM | POA: Diagnosis not present

## 2021-04-14 DIAGNOSIS — I495 Sick sinus syndrome: Secondary | ICD-10-CM

## 2021-04-14 DIAGNOSIS — I498 Other specified cardiac arrhythmias: Secondary | ICD-10-CM

## 2021-04-14 DIAGNOSIS — Z950 Presence of cardiac pacemaker: Secondary | ICD-10-CM

## 2021-04-14 MED ORDER — MIDODRINE HCL 2.5 MG PO TABS: 2.5000 mg | ORAL_TABLET | Freq: Three times a day (TID) | ORAL | 2 refills | Status: DC

## 2021-04-14 MED ORDER — AMIODARONE HCL 200 MG PO TABS: 200.0000 mg | ORAL_TABLET | Freq: Every day | ORAL | 2 refills | Status: DC

## 2021-04-14 MED ORDER — CARVEDILOL 3.125 MG PO TABS: 3.1250 mg | ORAL_TABLET | Freq: Two times a day (BID) | ORAL | 2 refills | Status: DC

## 2021-04-14 NOTE — Progress Notes (Signed)
Office Visit    [name] Date of Encounter: 04/14/2021  PCP:  Vivi Barrack, MD   [place]  Cardiologist:  Freada Bergeron, MD  Advanced Practice Provider:  No care team member to display Electrophysiologist:  None      Chief Complaint    Thomas [name] is a 87 y.o. male with a hx of hyperlipidemia, DM2, prior CVA, hypertension, CAD s/p CABG in 2001, SSS s/p PPM, PE on warfarin, orthostatic hypotension, hypothyroidism presents today for follow up of orthostatic hypotension.   Past Medical History    Past Medical History:  Diagnosis Date   Arthritis    Clotting disorder ([place])    Coronary artery disease    Diabetes ([place])    Hyperlipidemia    Hypertension    Hypothyroidism 07/17/2020   Kidney disease    Loss of hearing    Macular degeneration    Myocardial infarction [facility])    Osteoarthritis    Stroke [facility])    Past Surgical History:  Procedure Laterality Date   CARPAL TUNNEL RELEASE  2011   CORONARY ANGIOPLASTY WITH STENT PLACEMENT     CORONARY ARTERY BYPASS GRAFT  2001   x6   REPLACEMENT TOTAL KNEE     SPINE SURGERY  2008   cervical and lumbar   THYROIDECTOMY      Allergies  Allergies  Allergen Reactions   Amitriptyline Shortness Of Breath and Palpitations   Lyrica [Pregabalin] Nausea And Vomiting   Pravachol [Pravastatin] Other (See Comments)    Muscle cramps and aches    Effexor Xr [Venlafaxine] Anxiety   Morphine Palpitations   Zolpidem Anxiety and Palpitations    History of Present Illness    Thomas [name] is a 87 y.o. male with a hx of hyperlipidemia, DM2, prior CVA, hypertension, CAD s/p CABG in 2001, SSS s/p PPM, PE on warfarin, orthostatic hypotension, hypothyroidism last seen 01/08/2021 by Dr. Johney Frame.  Prior MI at age 21 that was managed medically the time.  Subsequent PCI as an MI in 2001 leading to bypass surgery.  No stents post bypass surgery.  He was seen in hypertension clinic  for optimization of blood pressure therapy.  He also follows with Dr. Lovena Le regarding ineffective PPM.  Most recent echocardiogram 07/2020 LVEF 60 to 65%, moderate basal septal hypertrophy, grade 1 diastolic dysfunction, dilated aorta/aortic root, moderate aortic sclerosis without stenosis, moderate MAC, mild MR.  ED visit 07/2020 due to fall with CT head negative for acute pathology and MRI head with remote strokes which were known.  Seen 09/2020 for syncope thought to be due to carvedilol.  He was recommended for admission but did not want to stay and carvedilol was decreased.  Seen in clinic 11/27/2020 noting being off many of the medications due to orthostasis.  He was also on Florinef.  Warfarin was changed to apixaban due to recurrent falls.  Seen in clinic 01/08/2021 with blood pressure 140s over 70s and had resumed Coreg 12.5 mg twice daily and was doing well.  He was working with PT on strengthening and mobility.  Admitted 03/16/2021 - 03/23/2021 with near syncope, orthostatic hypotension, bigeminy.  Cardiology was consulted.  On discharge he was recommended for Coreg 3.125 mg twice daily, midodrine 2.5 3 times daily, amiodarone 200 twice daily for 5 days then 200 mg daily.  He presents today for follow up with his son. Blood pressure has been more stable since hospital discharge. Notes readings most often systolic  130s-140s. Much improvement in lightheadedness. No near syncope nor syncope. He drinks 2 cans of Sprite and 2 bottles of water per day. Follows heart healthy diet. Does eat mostly low salt. He is a big [place] and enjoys watching football in his spare time.   EKGs/Labs/Other Studies Reviewed:   The following studies were reviewed today:  CT 2020-09-06: IMPRESSION: Atrophy with periventricular small vessel disease. Prior small infarcts in each thalamus as well as in the anterior limb of the left external capsule. No acute infarct is evident. No mass or hemorrhage.   There  are foci of arterial vascular calcification. There are foci of paranasal sinus disease.   ECHO 08/07/2020:    IMPRESSIONS   1. Left ventricular ejection fraction, by estimation, is 60 to 65%. The  left ventricle has normal function. The left ventricle has no regional  wall motion abnormalities. There is moderate left ventricular hypertrophy  of the basal-septal segment. Left  ventricular diastolic parameters are consistent with Grade I diastolic  dysfunction (impaired relaxation). Elevated left ventricular end-diastolic  pressure.   2. Right ventricular systolic function is mildly reduced. The right  ventricular size is normal.   3. The mitral valve is degenerative. Mild mitral valve regurgitation. No  evidence of mitral stenosis. Moderate mitral annular calcification.   4. The aortic valve is normal in structure. There is mild calcification  of the aortic valve. There is mild thickening of the aortic valve. Aortic  valve regurgitation is not visualized. Mild to moderate aortic valve  sclerosis/calcification is present,  without any evidence of aortic stenosis.   5. Aortic dilatation noted. There is mild dilatation of the aortic root,  measuring 40 mm. There is mild dilatation of the ascending aorta,  measuring 43 mm.  CT abdomen/pelvis: FINDINGS: Lower chest: Visualized lung bases are unremarkable. Moderate size sliding-type hiatal hernia is noted.   Hepatobiliary: No focal liver abnormality is seen. No gallstones, gallbladder wall thickening, or biliary dilatation.   Pancreas: Unremarkable. No pancreatic ductal dilatation or surrounding inflammatory changes.   Spleen: Normal in size without focal abnormality.   Adrenals/Urinary Tract: Adrenal glands are unremarkable. Kidneys are normal, without renal calculi, focal lesion, or hydronephrosis. Bladder is unremarkable.   Stomach/Bowel: There is no evidence of bowel obstruction or inflammation. Diverticulosis of descending  and sigmoid colon is noted without inflammation. The appendix is not visualized.   Vascular/Lymphatic: Aortic atherosclerosis. No enlarged abdominal or pelvic lymph nodes.   Reproductive: Prostate is unremarkable.   Other: Small fat containing periumbilical hernia is noted. No ascites is noted.   Musculoskeletal: Multiple small lucencies are noted throughout the pelvis and visualized proximal femurs concerning for possible lytic lesions related to multiple myeloma or metastatic disease.   IMPRESSION: 1. Moderate size sliding-type hiatal hernia. 2. Diverticulosis of descending and sigmoid colon is noted without inflammation. 3. Small fat containing periumbilical hernia. 4. Multiple small lucencies are noted throughout the pelvis and visualized proximal femurs concerning for possible lytic lesions related to multiple myeloma or metastatic disease. MRI may be performed for further evaluation.   MRI 09/06/2020: FINDINGS: Brain: No evidence of acute infarct. Remote infarct in the lateral right temporal lobe. Additional remote lacunar infarcts involving the bilateral thalami and bilateral cerebellar hemispheres. Additional small T2 hyperintensities in the basal ganglia most likely represent dilated perivascular spaces. There is advanced/confluent T2/FLAIR hyperintensities within the white matter and pons, most likely related to chronic microvascular ischemic disease. Punctate area of susceptibility artifact in the right cerebellar hemisphere, likely related  to prior microhemorrhage. Mild generalized cerebral volume loss. No hydrocephalus. Cavum septum pellucidum et vergae, anatomic variant. No acute hemorrhage. No mass lesion or abnormal mass effect. No extra-axial fluid collection.   Vascular: Major arterial flow voids are maintained at the skull base.   Skull and upper cervical spine: Normal marrow signal. Partially imaged upper cervical spine degenerative change.    Sinuses/Orbits: Right maxillary sinus retention cyst versus polyp. Scattered ethmoid air cell mucosal thickening without air-fluid levels. Unremarkable orbits.   Other: No sizable mastoid effusions.   IMPRESSION: 1. No evidence of acute intracranial abnormality.  No acute infarct. 2. Remote right temporal lobe and bilateral basal ganglia and cerebellar infarcts. 3. Advanced chronic microvascular ischemic disease.   TTE 08/07/20: IMPRESSIONS   1. Left ventricular ejection fraction, by estimation, is 60 to 65%. The  left ventricle has normal function. The left ventricle has no regional  wall motion abnormalities. There is moderate left ventricular hypertrophy  of the basal-septal segment. Left  ventricular diastolic parameters are consistent with Grade I diastolic  dysfunction (impaired relaxation). Elevated left ventricular end-diastolic  pressure.   2. Right ventricular systolic function is mildly reduced. The right  ventricular size is normal.   3. The mitral valve is degenerative. Mild mitral valve regurgitation. No  evidence of mitral stenosis. Moderate mitral annular calcification.   4. The aortic valve is normal in structure. There is mild calcification  of the aortic valve. There is mild thickening of the aortic valve. Aortic  valve regurgitation is not visualized. Mild to moderate aortic valve  sclerosis/calcification is present,  without any evidence of aortic stenosis.   5. Aortic dilatation noted. There is mild dilatation of the aortic root,  measuring 40 mm. There is mild dilatation of the ascending aorta,  measuring 43 mm.     EKG:  No EKG today.  Recent Labs: 03/16/2021: ALT 22; TSH 3.529 03/23/2021: Magnesium 1.7 04/03/2021: BUN 12; Creatinine, Ser 0.98; Hemoglobin 11.6; Platelets 229; Potassium 4.2; Sodium 138  Recent Lipid Panel    Component Value Date/Time   CHOL 90 08/31/2020 0523   TRIG 108 08/31/2020 0523   HDL 31 (L) 08/31/2020 0523   CHOLHDL 2.9  08/31/2020 0523   VLDL 22 08/31/2020 0523   LDLCALC 37 08/31/2020 0523    Home Medications   No outpatient medications have been marked as taking for the 04/14/21 encounter (Appointment) with Loel Dubonnet, NP.     Review of Systems      All other systems reviewed and are otherwise negative except as noted above.  Physical Exam    VS:  There were no vitals taken for this visit. , BMI There is no height or weight on file to calculate BMI.  Wt Readings from Last 3 Encounters:  04/03/21 160 lb (72.6 kg)  03/23/21 164 lb 7.4 oz (74.6 kg)  02/24/21 164 lb (74.4 kg)     GEN: Well nourished, well developed, in no acute distress. HEENT: normal. Neck: Supple, no JVD, carotid bruits, or masses. Cardiac: RRR, no murmurs, rubs, or gallops. No clubbing, cyanosis, edema.  Radials/PT 2+ and equal bilaterally.  Respiratory:  Respirations regular and unlabored, clear to auscultation bilaterally. GI: Soft, nontender, nondistended. MS: No deformity or atrophy. Skin: Warm and dry, no rash. Neuro:  Strength and sensation are intact. Psych: Normal affect.  Assessment & Plan    Multivessel CAD s/p CABG 2001 - Stable with no anginal symptoms. No indication for ischemic evaluation.  GDMT includes Rosuvastaitn. No aspirin  due ot chronic anticoagulation.   Hypertension /orthostatic hypotension - Improved with addition of Midodrine 2.59m TID. Encouraged home BP monitoring, slow position changes, adequate hydration. Refills provided.    SSS s/p PPM - Follows with EP.  Bigeminy / On Amiodarone therapy - Continue Coreg 3.1255mBID and Amiodarone 20037mD. Plan for thyroid panel, CMP at upcoming EP follow up. No signs of toxicity. No palpitations since initiation.   History of CVA - Continue rosuvastatin. Continue optimal BP control. No aspirin due to chronic anticoagulation.   Hyperlipidemia - Continue Crestor 77m63m.  Hypothyroidism - Continue to follow with primary care. Careful monitoring  in setting of Amiodarone use.   DM2 - Continue to follow with PCP.   Hx of PE / Chronic anticoagulation - Denies bleeding complications on Eliquis 5mg 33m. Does not meet dose reduction criteria.   Disposition: Follow up in 4 month(s) with Dr. PembeJohney FramePP.  Signed, CaitlLoel Dubonnet10/18/2022, 1:44 PM [facility]

## 2021-04-14 NOTE — Chronic Care Management (AMB) (Signed)
  Chronic Care Management   Note  04/14/2021 Name: Thomas [name] MRN: [id number] DOB: 12-10-1928  Thomas [name] is a 87 y.o. year old male who is a primary care patient of Ardith Dark, MD. Thomas [name] is currently enrolled in care management services. An additional referral for RN CM  was placed.   Follow up plan: Telephone appointment with care management team member scheduled for:04/30/2021  Penne Lash, RMA Care Guide, Embedded Care Coordination [facility]  [place], [place] [address] Direct Dial: [phone] [email or web address]

## 2021-04-14 NOTE — Patient Instructions (Addendum)
Medication Instructions:  Continue your current medications.   We sent refills to the pharmacy.   *If you need a refill on your cardiac medications before your next appointment, please call your pharmacy*   Lab Work: None ordered today.   Testing/Procedures: None ordered today.    Follow-Up: At [facility], you and your health needs are our priority.  As part of our continuing mission to provide you with exceptional heart care, we have created designated Provider Care Teams.  These Care Teams include your primary Cardiologist (physician) and Advanced Practice Providers (APPs -  Physician Assistants and Nurse Practitioners) who all work together to provide you with the care you need, when you need it.  We recommend signing up for the patient portal called "MyChart".  Sign up information is provided on this After Visit Summary.  MyChart is used to connect with patients for Virtual Visits (Telemedicine).  Patients are able to view lab/test results, encounter notes, upcoming appointments, etc.  Non-urgent messages can be sent to your provider as well.   To learn more about what you can do with MyChart, go to [email or web address].    Your next appointment:   4 month(s)  The format for your next appointment:   In Person  Provider:   You may see Meriam Sprague, MD or one of the following Advanced Practice Providers on your designated Care Team:   Tereso Newcomer, PA-C Vin Putnam, [place] Alver Sorrow, NP    Other Instructions  Orthostatic Hypotension Blood pressure is a measurement of how strongly, or weakly, your circulating blood is pressing against the walls of your arteries. Orthostatic hypotension is a drop in blood pressure that can happen when you change positions, such as when you go from lying down to standing. Arteries are blood vessels that carry blood from your heart throughout your body. When blood pressure is too low, you may not get enough blood to your brain or  to the rest of your organs. Orthostatic hypotension can cause light-headedness, sweating, rapid heartbeat, blurred vision, and fainting. These symptoms require further investigation into the cause. What are the causes? Orthostatic hypotension can be caused by many things, including: Sudden changes in posture, such as standing up quickly after you have been sitting or lying down. Loss of blood (anemia) or loss of body fluids (dehydration). Heart problems, neurologic problems, or hormone problems. Pregnancy. Aging. The risk for this condition increases as you get older. Severe infection (sepsis). Certain medicines, such as medicines for high blood pressure or medicines that make the body lose excess fluids (diuretics). What are the signs or symptoms? Symptoms of this condition may include: Weakness, light-headedness, or dizziness. Sweating. Blurred vision. Tiredness (fatigue). Rapid heartbeat. Fainting, in severe cases. How is this diagnosed? This condition is diagnosed based on: Your symptoms and medical history. Your blood pressure measurements. Your health care provider will check your blood pressure when you are: Lying down. Sitting. Standing. A blood pressure reading is recorded as two numbers, such as "120 over 80" (or 120/80). The first ("top") number is called the systolic pressure. It is a measure of the pressure in your arteries as your heart beats. The second ("bottom") number is called the diastolic pressure. It is a measure of the pressure in your arteries when your heart relaxes between beats. Blood pressure is measured in a unit called mmHg. Healthy blood pressure for most adults is 120/80 mmHg. Orthostatic hypotension is defined as a 20 mmHg drop in systolic pressure or a 10  mmHg drop in diastolic pressure within 3 minutes of standing. Other information or tests that may be used to diagnose orthostatic hypotension include: Your other vital signs, such as your heart rate and  temperature. Blood tests. An electrocardiogram (ECG) or echocardiogram. A Holter monitor. This is a device you wear that records your heart rhythm continuously, usually for 24-48 hours. Tilt table test. For this test, you will be safely secured to a table that moves you from a lying position to an upright position. Your heart rhythm and blood pressure will be monitored during the test. How is this treated? This condition may be treated by: Changing your diet. This may involve eating more salt (sodium) or drinking more water. Changing the dosage of certain medicines you are taking that might be lowering your blood pressure. Correcting the underlying reason for the orthostatic hypotension. Wearing compression stockings. Taking medicines to raise your blood pressure. Avoiding actions that trigger symptoms. Follow these instructions at home: Medicines Take over-the-counter and prescription medicines only as told by your health care provider. Follow instructions from your health care provider about changing the dosage of your current medicines, if this applies. Do not stop or adjust any of your medicines on your own. Eating and drinking  Drink enough fluid to keep your urine pale yellow. Eat extra salt only as directed. Do not add extra salt to your diet unless advised by your health care provider. Eat frequent, small meals. Avoid standing up suddenly after eating. General instructions  Get up slowly from lying down or sitting positions. This gives your blood pressure a chance to adjust. Avoid hot showers and excessive heat as directed by your health care provider. Engage in regular physical activity as directed by your health care provider. If you have compression stockings, wear them as told. Keep all follow-up visits. This is important. Contact a health care provider if: You have a fever for more than 2-3 days. You feel more thirsty than usual. You feel dizzy or weak. Get help right  away if: You have chest pain. You have a fast or irregular heartbeat. You become sweaty or feel light-headed. You feel short of breath. You faint. You have any symptoms of a stroke. "BE FAST" is an easy way to remember the main warning signs of a stroke: B - Balance. Signs are dizziness, sudden trouble walking, or loss of balance. E - Eyes. Signs are trouble seeing or a sudden change in vision. F - Face. Signs are sudden weakness or numbness of the face, or the face or eyelid drooping on one side. A - Arms. Signs are weakness or numbness in an arm. This happens suddenly and usually on one side of the body. S - Speech. Signs are sudden trouble speaking, slurred speech, or trouble understanding what people say. T - Time. Time to call emergency services. Write down what time symptoms started. You have other signs of a stroke, such as: A sudden, severe headache with no known cause. Nausea or vomiting. Seizure. These symptoms may represent a serious problem that is an emergency. Do not wait to see if the symptoms will go away. Get medical help right away. Call your local emergency services (911 in the [place] Do not drive yourself to the hospital. Summary Orthostatic hypotension is a sudden drop in blood pressure. It can cause light-headedness, sweating, rapid heartbeat, blurred vision, and fainting. Orthostatic hypotension can be diagnosed by having your blood pressure taken while lying down, sitting, and then standing. Treatment may involve changing  your diet, wearing compression stockings, sitting up slowly, adjusting your medicines, or correcting the underlying reason for the orthostatic hypotension. Get help right away if you have chest pain, a fast or irregular heartbeat, or symptoms of a stroke. This information is not intended to replace advice given to you by your health care provider. Make sure you discuss any questions you have with your health care provider. Document Revised:  08/28/2020 Document Reviewed: 08/28/2020 Elsevier Patient Education  2022 Elsevier Inc.  Heart Healthy Diet Recommendations: A low-salt diet is recommended. Meats should be grilled, baked, or boiled. Avoid fried foods. Focus on lean protein sources like fish or chicken with vegetables and fruits. The American Heart Association is a Chief Technology Officer!    Exercise recommendations: The American Heart Association recommends 150 minutes of moderate intensity exercise weekly. Try 30 minutes of moderate intensity exercise 4-5 times per week. This could include walking, jogging, or swimming.

## 2021-04-17 DIAGNOSIS — I951 Orthostatic hypotension: Secondary | ICD-10-CM | POA: Diagnosis not present

## 2021-04-17 DIAGNOSIS — I69351 Hemiplegia and hemiparesis following cerebral infarction affecting right dominant side: Secondary | ICD-10-CM | POA: Diagnosis not present

## 2021-04-17 DIAGNOSIS — I2510 Atherosclerotic heart disease of native coronary artery without angina pectoris: Secondary | ICD-10-CM | POA: Diagnosis not present

## 2021-04-17 DIAGNOSIS — M1610 Unilateral primary osteoarthritis, unspecified hip: Secondary | ICD-10-CM | POA: Diagnosis not present

## 2021-04-17 DIAGNOSIS — I495 Sick sinus syndrome: Secondary | ICD-10-CM | POA: Diagnosis not present

## 2021-04-17 DIAGNOSIS — I10 Essential (primary) hypertension: Secondary | ICD-10-CM | POA: Diagnosis not present

## 2021-04-17 DIAGNOSIS — E1169 Type 2 diabetes mellitus with other specified complication: Secondary | ICD-10-CM | POA: Diagnosis not present

## 2021-04-17 DIAGNOSIS — H3530 Unspecified macular degeneration: Secondary | ICD-10-CM | POA: Diagnosis not present

## 2021-04-17 DIAGNOSIS — E785 Hyperlipidemia, unspecified: Secondary | ICD-10-CM | POA: Diagnosis not present

## 2021-04-17 DIAGNOSIS — Z950 Presence of cardiac pacemaker: Secondary | ICD-10-CM | POA: Diagnosis not present

## 2021-04-17 DIAGNOSIS — N400 Enlarged prostate without lower urinary tract symptoms: Secondary | ICD-10-CM | POA: Diagnosis not present

## 2021-04-17 DIAGNOSIS — E039 Hypothyroidism, unspecified: Secondary | ICD-10-CM | POA: Diagnosis not present

## 2021-04-17 DIAGNOSIS — E559 Vitamin D deficiency, unspecified: Secondary | ICD-10-CM | POA: Diagnosis not present

## 2021-04-17 DIAGNOSIS — G4700 Insomnia, unspecified: Secondary | ICD-10-CM | POA: Diagnosis not present

## 2021-04-17 DIAGNOSIS — Z951 Presence of aortocoronary bypass graft: Secondary | ICD-10-CM | POA: Diagnosis not present

## 2021-04-17 DIAGNOSIS — D649 Anemia, unspecified: Secondary | ICD-10-CM | POA: Diagnosis not present

## 2021-04-24 DIAGNOSIS — I69351 Hemiplegia and hemiparesis following cerebral infarction affecting right dominant side: Secondary | ICD-10-CM | POA: Diagnosis not present

## 2021-04-24 DIAGNOSIS — E559 Vitamin D deficiency, unspecified: Secondary | ICD-10-CM

## 2021-04-24 DIAGNOSIS — N400 Enlarged prostate without lower urinary tract symptoms: Secondary | ICD-10-CM

## 2021-04-24 DIAGNOSIS — M1610 Unilateral primary osteoarthritis, unspecified hip: Secondary | ICD-10-CM | POA: Diagnosis not present

## 2021-04-24 DIAGNOSIS — E785 Hyperlipidemia, unspecified: Secondary | ICD-10-CM

## 2021-04-24 DIAGNOSIS — H3530 Unspecified macular degeneration: Secondary | ICD-10-CM

## 2021-04-24 DIAGNOSIS — E039 Hypothyroidism, unspecified: Secondary | ICD-10-CM

## 2021-04-24 DIAGNOSIS — E1169 Type 2 diabetes mellitus with other specified complication: Secondary | ICD-10-CM

## 2021-04-24 DIAGNOSIS — G4700 Insomnia, unspecified: Secondary | ICD-10-CM

## 2021-04-24 DIAGNOSIS — Z86010 Personal history of colonic polyps: Secondary | ICD-10-CM

## 2021-04-24 DIAGNOSIS — Z951 Presence of aortocoronary bypass graft: Secondary | ICD-10-CM

## 2021-04-24 DIAGNOSIS — Z794 Long term (current) use of insulin: Secondary | ICD-10-CM

## 2021-04-24 DIAGNOSIS — Z9181 History of falling: Secondary | ICD-10-CM

## 2021-04-24 DIAGNOSIS — I10 Essential (primary) hypertension: Secondary | ICD-10-CM | POA: Diagnosis not present

## 2021-04-24 DIAGNOSIS — D649 Anemia, unspecified: Secondary | ICD-10-CM

## 2021-04-24 DIAGNOSIS — I495 Sick sinus syndrome: Secondary | ICD-10-CM

## 2021-04-24 DIAGNOSIS — Z950 Presence of cardiac pacemaker: Secondary | ICD-10-CM

## 2021-04-24 DIAGNOSIS — I951 Orthostatic hypotension: Secondary | ICD-10-CM | POA: Diagnosis not present

## 2021-04-24 DIAGNOSIS — I2510 Atherosclerotic heart disease of native coronary artery without angina pectoris: Secondary | ICD-10-CM

## 2021-04-25 NOTE — Progress Notes (Signed)
Cardiology Office Note Date:  04/28/2021  Patient ID:  Thomas [name], Thomas [name] 09/06/1928, MRN [id number] PCP:  Ardith Dark, MD  Cardiologist:  Dr. Shari Prows Electrophysiologist: Dr. Ladona Ridgel    Chief Complaint:  planned EP follow up post hospital.  History of Present Illness: Thomas [name] is a 87 y.o. male with history of CAD (CABG 2001), HLD, DM, stroke, HTN w/orthostatic hypotension, hypothyroidism, Hx of PE on OAC,  SSSx w/PPM.  He comes in today to be seen for Dr. Ladona Ridgel, last seen by him Feb 2022, mention no recurrent Afib on his device, no syncope, changes were made  He has been struggling of late with orthostatic hypotension, near syncope, Admitted 03/16/2021 - 03/23/2021 with near syncope, orthostatic hypotension, bigeminy.  Cardiology was consulted.  On discharge he was recommended for Coreg 3.125 mg twice daily, midodrine 2.5 3 times daily, amiodarone 200 twice daily for 5 days then 200 mg daily, seen by Dr. Lalla Brothers, recommended the amiodarone and no pacer setting changes, EP APP follow up in the office  He had cardiology follow up 04/14/21 with Brunetta Genera, NP, reported doing better without recurrent near syncope or syncope.  Planned for f/u in 65mo. Was due for EP. Planned for amio labs  TODAY He comes today accompanied by his son. They report that his wildly swinging BPs are much better as are his symptoms He has been getting PT since discharge and strength and stamina are improving No CP, palpitations When they check the pulse Ox the HR is slow, but note his palpated pulse is not and have been told this is 2/2 the PVCs They have tried to urge him not to get so focused on this, NO syncope No bleeding or signs of bleeding   Device information MDT dual chamber PPM implanted 05/04/2007 Appears to have gotten a new RV lead and gen change 08/08/19 I DO NOT SEE ANY ABANDONED LEADS BY LAST CXR 02/2021   Past Medical History:  Diagnosis Date   Arthritis    Clotting disorder  ([name])    Coronary artery disease    Diabetes ([name])    Hyperlipidemia    Hypertension    Hypothyroidism 07/17/2020   Kidney disease    Loss of hearing    Macular degeneration    Myocardial infarction [facility])    Osteoarthritis    Stroke [facility])     Past Surgical History:  Procedure Laterality Date   CARPAL TUNNEL RELEASE  2011   CORONARY ANGIOPLASTY WITH STENT PLACEMENT     CORONARY ARTERY BYPASS GRAFT  2001   x6   REPLACEMENT TOTAL KNEE     SPINE SURGERY  2008   cervical and lumbar   THYROIDECTOMY      Current Outpatient Medications  Medication Sig Dispense Refill   acetaminophen (TYLENOL) 325 MG tablet Take 650 mg by mouth every 6 (six) hours as needed for moderate pain or headache.     amiodarone (PACERONE) 200 MG tablet Take 1 tablet (200 mg total) by mouth daily. 90 tablet 2   apixaban (ELIQUIS) 5 MG TABS tablet Take 1 tablet (5 mg total) by mouth 2 (two) times daily. 180 tablet 3   carvedilol (COREG) 3.125 MG tablet Take 1 tablet (3.125 mg total) by mouth 2 (two) times daily with a meal. 180 tablet 2   cholecalciferol (VITAMIN D3) 25 MCG (1000 UNIT) tablet Take 1,000 Units by mouth daily.     co-enzyme Q-10 50 MG capsule Take 50 mg by mouth daily.  cyclobenzaprine (FLEXERIL) 5 MG tablet Take 2 tablets (10 mg total) by mouth 2 (two) times daily as needed for up to 20 doses for muscle spasms. 20 tablet 0   ferrous sulfate 325 (65 FE) MG EC tablet TAKE 1 TABLET(325 MG) BY MOUTH DAILY WITH BREAKFAST 30 tablet 3   glucosamine-chondroitin 500-400 MG tablet Take 1 tablet by mouth 3 (three) times daily.     insulin glargine (LANTUS) 100 unit/mL SOPN Inject 15 Units into the skin daily. 15 mL 3   Insulin Pen Needle 31G X 5 MM MISC 1 each by Does not apply route daily. Please use a new pen needle daily with Lantus injection 100 each 3   levothyroxine (SYNTHROID) 100 MCG tablet TAKE 1 TABLET(100 MCG) BY MOUTH DAILY 90 tablet 0   midodrine (PROAMATINE) 2.5 MG tablet Take 1 tablet (2.5  mg total) by mouth 3 (three) times daily with meals. 270 tablet 2   mirtazapine (REMERON) 7.5 MG tablet TAKE 1 TABLET(7.5 MG) BY MOUTH AT BEDTIME 90 tablet 0   Multiple Vitamin (MULTIVITAMIN) tablet Take 1 tablet by mouth daily.     Red Yeast Rice 600 MG CAPS Take by mouth daily at 6 (six) AM.     Red Yeast Rice Extract (RED YEAST RICE PO) Take 1 capsule by mouth in the morning and at bedtime.     rosuvastatin (CRESTOR) 20 MG tablet Take 1 tablet (20 mg total) by mouth at bedtime. 90 tablet 3   tamsulosin (FLOMAX) 0.4 MG CAPS capsule Take 0.4 mg by mouth daily after supper.     No current facility-administered medications for this visit.    Allergies:   Amitriptyline, Lyrica [pregabalin], Pravachol [pravastatin], Effexor xr [venlafaxine], Morphine, and Zolpidem   Social History:  The patient  reports that he has quit smoking. His smoking use included pipe. He has never used smokeless tobacco. He reports that he does not drink alcohol and does not use drugs.   Family History:  The patient's family history includes Cancer in his brother and daughter; Heart disease in his brother, brother, and sister; Hypertension in his son; Stroke in his father and mother.  ROS:  Please see the history of present illness.    All other systems are reviewed and otherwise negative.   PHYSICAL EXAM:  VS:  BP 120/70   Pulse (!) 50   Ht 5\' 8"  (1.727 m)   Wt 164 lb 9.6 oz (74.7 kg)   SpO2 94%   BMI 25.03 kg/m  BMI: Body mass index is 25.03 kg/m. Well nourished, well developed, in no acute distress HEENT: normocephalic, atraumatic Neck: no JVD, carotid bruits or masses Cardiac:  RRR; no significant murmurs, no rubs, or gallops Lungs:  CTA b/l, no wheezing, rhonchi or rales Abd: soft, nontender MS: no deformity or age appropriate atrophy Ext: trace if any no edema Skin: warm and dry, no rash Neuro:  No gross deficits appreciated, hard of hearing Psych: euthymic mood, full affect  PPM site is stable, no  tethering or discomfort   EKG:  not done today  Device interrogation done today and reviewed by myself:  Battery and lead measurements are good No arrhythmias 97.1% AP <0.1% VP    03/17/21: TTE IMPRESSIONS   1. Left ventricular ejection fraction, by estimation, is 60 to 65%. The  left ventricle has normal function. The left ventricle has no regional  wall motion abnormalities. There is moderate asymmetric left ventricular  hypertrophy of the septal segment.  Left ventricular  diastolic parameters are consistent with Grade I  diastolic dysfunction (impaired relaxation).   2. Right ventricular systolic function is normal. The right ventricular  size is normal. There is normal pulmonary artery systolic pressure. The  estimated right ventricular systolic pressure is 25.8 mmHg.   3. Left atrial size was moderately dilated.   4. The mitral valve is degenerative. Mild mitral valve regurgitation. No  evidence of mitral stenosis.   5. The aortic valve is calcified. Aortic valve regurgitation is not  visualized. Mild to moderate aortic valve sclerosis/calcification is  present, without any evidence of aortic stenosis.   6. Aortic dilatation noted. There is mild dilatation of the aortic root,  measuring 38 mm.   7. The inferior vena cava is normal in size with greater than 50%  respiratory variability, suggesting right atrial pressure of 3 mmHg.     Recent Labs: 03/16/2021: ALT 22; TSH 3.529 03/23/2021: Magnesium 1.7 04/03/2021: BUN 12; Creatinine, Ser 0.98; Hemoglobin 11.6; Platelets 229; Potassium 4.2; Sodium 138  08/31/2020: Cholesterol 90; HDL 31; LDL Cholesterol 37; Total CHOL/HDL Ratio 2.9; Triglycerides 108; VLDL 22   CrCl cannot be calculated (Patient's most recent lab result is older than the maximum 21 days allowed.).   Wt Readings from Last 3 Encounters:  04/28/21 164 lb 9.6 oz (74.7 kg)  04/03/21 160 lb (72.6 kg)  03/23/21 164 lb 7.4 oz (74.6 kg)     Other studies  reviewed: Additional studies/records reviewed today include: summarized above  ASSESSMENT AND PLAN:  PPM Intact function, no programming changes made  HTN  Now struggling with orthostatic hypotension Midodrine Much better  CAD No anginal symptoms C/w Dr. Shari Prows and team  Paroxysmal AFib (by Dr. Lubertha Basque last note) PE hx Hx of warfarin >> Eliquis labs today  PVCs Amiodarone In prolonged observation of EGms with pacer check, PVCs appear to be occassional at least today.  3-4 screens in between single PVCs Labs today    Disposition: F/u with remotes as usual and in clinic with EP in a year, sooner if needed.  Dr. Shari Prows as scheduled  Current medicines are reviewed at length with the patient today.  The patient did not have any concerns regarding medicines.  Norma Fredrickson, PA-C 04/28/2021 10:34 AM     [facility] [address] [place] [address] [phone] (office)  [phone] (fax)

## 2021-04-28 ENCOUNTER — Ambulatory Visit: Payer: Medicare Other | Admitting: Physician Assistant

## 2021-04-28 ENCOUNTER — Encounter: Payer: Self-pay | Admitting: Physician Assistant

## 2021-04-28 ENCOUNTER — Other Ambulatory Visit: Payer: Self-pay

## 2021-04-28 VITALS — BP 120/70 | HR 50 | Ht 68.0 in | Wt 164.6 lb

## 2021-04-28 DIAGNOSIS — I951 Orthostatic hypotension: Secondary | ICD-10-CM | POA: Diagnosis not present

## 2021-04-28 DIAGNOSIS — Z950 Presence of cardiac pacemaker: Secondary | ICD-10-CM

## 2021-04-28 DIAGNOSIS — I2510 Atherosclerotic heart disease of native coronary artery without angina pectoris: Secondary | ICD-10-CM

## 2021-04-28 DIAGNOSIS — I10 Essential (primary) hypertension: Secondary | ICD-10-CM

## 2021-04-28 DIAGNOSIS — I480 Paroxysmal atrial fibrillation: Secondary | ICD-10-CM

## 2021-04-28 DIAGNOSIS — Z79899 Other long term (current) drug therapy: Secondary | ICD-10-CM

## 2021-04-28 DIAGNOSIS — I493 Ventricular premature depolarization: Secondary | ICD-10-CM

## 2021-04-28 NOTE — Patient Instructions (Addendum)
Medication Instructions:   Your physician recommends that you continue on your current medications as directed. Please refer to the Current Medication list given to you today.   *If you need a refill on your cardiac medications before your next appointment, please call your pharmacy*   Lab Work:  CMET AND  TSH   If you have labs (blood work) drawn today and your tests are completely normal, you will receive your results only by: MyChart Message (if you have MyChart) OR A paper copy in the mail If you have any lab test that is abnormal or we need to change your treatment, we will call you to review the results.   Testing/Procedures: NONE ORDERED  TODAY    Follow-Up: At [facility], you and your health needs are our priority.  As part of our continuing mission to provide you with exceptional heart care, we have created designated Provider Care Teams.  These Care Teams include your primary Cardiologist (physician) and Advanced Practice Providers (APPs -  Physician Assistants and Nurse Practitioners) who all work together to provide you with the care you need, when you need it.  We recommend signing up for the patient portal called "MyChart".  Sign up information is provided on this After Visit Summary.  MyChart is used to connect with patients for Virtual Visits (Telemedicine).  Patients are able to view lab/test results, encounter notes, upcoming appointments, etc.  Non-urgent messages can be sent to your provider as well.   To learn more about what you can do with MyChart, go to [email or web address].    Your next appointment:   1 year(s)  The format for your next appointment:   In Person  Provider:   You will see one of the following Advanced Practice Providers on your designated Care Team:   Francis Dowse, [place] Casimiro Needle "Mardelle Matte" Lanna Poche, [place]     Other Instructions

## 2021-04-30 ENCOUNTER — Ambulatory Visit (INDEPENDENT_AMBULATORY_CARE_PROVIDER_SITE_OTHER): Payer: Medicare Other | Admitting: *Deleted

## 2021-04-30 ENCOUNTER — Telehealth: Payer: Self-pay

## 2021-04-30 ENCOUNTER — Ambulatory Visit (INDEPENDENT_AMBULATORY_CARE_PROVIDER_SITE_OTHER): Payer: Medicare Other

## 2021-04-30 DIAGNOSIS — I495 Sick sinus syndrome: Secondary | ICD-10-CM

## 2021-04-30 DIAGNOSIS — Z794 Long term (current) use of insulin: Secondary | ICD-10-CM

## 2021-04-30 DIAGNOSIS — I10 Essential (primary) hypertension: Secondary | ICD-10-CM

## 2021-04-30 DIAGNOSIS — E1165 Type 2 diabetes mellitus with hyperglycemia: Secondary | ICD-10-CM

## 2021-04-30 LAB — CUP PACEART REMOTE DEVICE CHECK
Battery Remaining Longevity: 136 mo
Battery Voltage: 3.01 V
Brady Statistic AP VP Percent: 0.03 %
Brady Statistic AP VS Percent: 98.43 %
Brady Statistic AS VP Percent: 0 %
Brady Statistic AS VS Percent: 1.54 %
Brady Statistic RA Percent Paced: 98.67 %
Brady Statistic RV Percent Paced: 0.04 %
Date Time Interrogation Session: 20221102193658
Implantable Lead Implant Date: 20081106
Implantable Lead Implant Date: 20210210
Implantable Lead Location: 753859
Implantable Lead Location: 753860
Implantable Lead Model: 4076
Implantable Lead Model: 4076
Implantable Pulse Generator Implant Date: 20210210
Lead Channel Impedance Value: 266 Ohm
Lead Channel Impedance Value: 323 Ohm
Lead Channel Impedance Value: 323 Ohm
Lead Channel Impedance Value: 513 Ohm
Lead Channel Pacing Threshold Amplitude: 0.5 V
Lead Channel Pacing Threshold Amplitude: 1 V
Lead Channel Pacing Threshold Pulse Width: 0.4 ms
Lead Channel Pacing Threshold Pulse Width: 0.4 ms
Lead Channel Sensing Intrinsic Amplitude: 1.25 mV
Lead Channel Sensing Intrinsic Amplitude: 1.25 mV
Lead Channel Sensing Intrinsic Amplitude: 9.125 mV
Lead Channel Sensing Intrinsic Amplitude: 9.125 mV
Lead Channel Setting Pacing Amplitude: 1.5 V
Lead Channel Setting Pacing Amplitude: 2 V
Lead Channel Setting Pacing Pulse Width: 0.4 ms
Lead Channel Setting Sensing Sensitivity: 0.9 mV

## 2021-04-30 LAB — TSH: TSH: 4.53 u[IU]/mL — ABNORMAL HIGH (ref 0.450–4.500)

## 2021-04-30 LAB — COMPREHENSIVE METABOLIC PANEL
ALT: 22 IU/L (ref 0–44)
AST: 22 IU/L (ref 0–40)
Albumin/Globulin Ratio: 1.4 (ref 1.2–2.2)
Albumin: 3.8 g/dL (ref 3.5–4.6)
Alkaline Phosphatase: 67 IU/L (ref 44–121)
BUN/Creatinine Ratio: 17 (ref 10–24)
BUN: 18 mg/dL (ref 10–36)
Bilirubin Total: 0.3 mg/dL (ref 0.0–1.2)
CO2: 24 mmol/L (ref 20–29)
Calcium: 8.6 mg/dL (ref 8.6–10.2)
Chloride: 103 mmol/L (ref 96–106)
Creatinine, Ser: 1.08 mg/dL (ref 0.76–1.27)
Globulin, Total: 2.7 g/dL (ref 1.5–4.5)
Glucose: 118 mg/dL — ABNORMAL HIGH (ref 70–99)
Potassium: 4.5 mmol/L (ref 3.5–5.2)
Sodium: 138 mmol/L (ref 134–144)
Total Protein: 6.5 g/dL (ref 6.0–8.5)
eGFR: 65 mL/min/{1.73_m2} (ref >59)

## 2021-04-30 NOTE — Telephone Encounter (Signed)
..  Home Health Certification or Plan of Care Tracking  Is this a Certification or Plan of Care?  Cert  HH Agency:  [facility]  Order Number:  [id number]  Has charge sheet been attached? yes  Where has form been placed:   Engineer, maintenance

## 2021-04-30 NOTE — Chronic Care Management (AMB) (Signed)
  Care Management   Follow Up Note   04/30/2021 Name: Thomas [name] MRN: [id number] DOB: July 20, 1928   Referred by: Ardith Dark, MD Reason for referral : Chronic Care Management (Initial)   Successful telephone outreach to patient's wife ( Release of information) on file.  Wife stating there is company at the house and request  call back another day and time.  Follow Up Plan: The care management team will reach out to the patient again over the next 30 days per wife's request.    Rhae Lerner RN, MSN RN Care Management Coordinator  [facility] [phone] [email or web address]

## 2021-04-30 NOTE — Telephone Encounter (Signed)
Copied from CRM [id number]. Topic: Medicare AWV >> Apr 30, 2021 11:48 AM Harris-Coley, [place] G[place] wrote: Reason for CRM: Attempted to schedule AWV. Unable to LVM.  Will try at later time.

## 2021-05-01 ENCOUNTER — Other Ambulatory Visit: Payer: Self-pay | Admitting: *Deleted

## 2021-05-01 DIAGNOSIS — Z79899 Other long term (current) drug therapy: Secondary | ICD-10-CM

## 2021-05-06 NOTE — Progress Notes (Signed)
Remote pacemaker transmission.   

## 2021-05-12 ENCOUNTER — Ambulatory Visit: Payer: Medicare Other | Admitting: *Deleted

## 2021-05-12 DIAGNOSIS — E1165 Type 2 diabetes mellitus with hyperglycemia: Secondary | ICD-10-CM

## 2021-05-12 DIAGNOSIS — I10 Essential (primary) hypertension: Secondary | ICD-10-CM

## 2021-05-12 DIAGNOSIS — Z794 Long term (current) use of insulin: Secondary | ICD-10-CM

## 2021-05-12 NOTE — Chronic Care Management (AMB) (Signed)
  Care Management   Follow Up Note   05/12/2021 Name: Thomas [name] MRN: [id number] DOB: 05/25/1929   Referred by: Ardith Dark, MD Reason for referral : Chronic Care Management (Initial)   Successful telephone outreach to patient for initial telephone assessment.  Wife answered and stated it is a very busy day and requested call back another day and time.  Follow Up Plan: The care management team will reach out to the patient again over the next 10 days.   Rhae Lerner RN, MSN RN Care Management Coordinator  [facility] [phone] [email or web address]

## 2021-05-14 ENCOUNTER — Ambulatory Visit: Payer: Medicare Other | Admitting: *Deleted

## 2021-05-14 DIAGNOSIS — E1165 Type 2 diabetes mellitus with hyperglycemia: Secondary | ICD-10-CM

## 2021-05-14 DIAGNOSIS — Z794 Long term (current) use of insulin: Secondary | ICD-10-CM

## 2021-05-14 DIAGNOSIS — I10 Essential (primary) hypertension: Secondary | ICD-10-CM

## 2021-05-17 NOTE — Chronic Care Management (AMB) (Signed)
Chronic Care Management   CCM RN Visit Note  05/17/2021 Name: Thomas [name] MRN: [id number] DOB: [birth year]  Subjective: Thomas [name] is a 87 y.o. year old male who is a primary care patient of Thomas [name], Thomas Greenhouse, MD. The care management team was consulted for assistance with disease management and care coordination needs.    Engaged with patient by telephone for initial visit in response to provider referral for case management and/or care coordination services.   Consent to Services:  The patient was given the following information about Chronic Care Management services today, agreed to services, and gave verbal consent: 1. CCM service includes personalized support from designated clinical staff supervised by the primary care provider, including individualized plan of care and coordination with other care providers 2. 24/7 contact phone numbers for assistance for urgent and routine care needs. 3. Service will only be billed when office clinical staff spend 20 minutes or more in a month to coordinate care. 4. Only one practitioner may furnish and bill the service in a calendar month. 5.The patient may stop CCM services at any time (effective at the end of the month) by phone call to the office staff. 6. The patient will be responsible for cost sharing (co-pay) of up to 20% of the service fee (after annual deductible is met). Patient agreed to services and consent obtained.  Patient agreed to services and verbal consent obtained.   Assessment: Review of patient past medical history, allergies, medications, health status, including review of consultants reports, laboratory and other test data, was performed as part of comprehensive evaluation and provision of chronic care management services.   SDOH (Social Determinants of Health) assessments and interventions performed:  SDOH Interventions    Flowsheet Row Most Recent Value  SDOH Interventions   Food Insecurity Interventions Intervention  Not Indicated  Housing Interventions Intervention Not Indicated  Transportation Interventions Intervention Not Indicated        CCM Care Plan  Allergies  Allergen Reactions   Amitriptyline Shortness Of Breath and Palpitations   Lyrica [Pregabalin] Nausea And Vomiting   Pravachol [Pravastatin] Other (See Comments)    Muscle cramps and aches    Effexor Xr [Venlafaxine] Anxiety   Morphine Palpitations   Zolpidem Anxiety and Palpitations    Outpatient Encounter Medications as of 05/14/2021  Medication Sig Note   acetaminophen (TYLENOL) 325 MG tablet Take 650 mg by mouth every 6 (six) hours as needed for moderate [name] or headache.    amiodarone (PACERONE) 200 MG tablet Take 1 tablet (200 mg total) by mouth daily.    apixaban (ELIQUIS) 5 MG TABS tablet Take 1 tablet (5 mg total) by mouth 2 (two) times daily.    carvedilol (COREG) 3.125 MG tablet Take 1 tablet (3.125 mg total) by mouth 2 (two) times daily with a meal.    cholecalciferol (VITAMIN D3) 25 MCG (1000 UNIT) tablet Take 1,000 Units by mouth daily.    co-enzyme Q-10 50 MG capsule Take 50 mg by mouth daily.    ferrous sulfate 325 (65 FE) MG EC tablet TAKE 1 TABLET(325 MG) BY MOUTH DAILY WITH BREAKFAST    glucosamine-chondroitin 500-400 MG tablet Take 1 tablet by mouth 3 (three) times daily.    insulin glargine (LANTUS) 100 unit/mL SOPN Inject 15 Units into the skin daily.    levothyroxine (SYNTHROID) 100 MCG tablet TAKE 1 TABLET(100 MCG) BY MOUTH DAILY    midodrine (PROAMATINE) 2.5 MG tablet Take 1 tablet (2.5 mg total) by mouth 3 (three) times daily  with meals.    mirtazapine (REMERON) 7.5 MG tablet TAKE 1 TABLET(7.5 MG) BY MOUTH AT BEDTIME    Multiple Vitamin (MULTIVITAMIN) tablet Take 1 tablet by mouth daily.    Red Yeast Rice Extract (RED YEAST RICE PO) Take 1 capsule by mouth in the morning and at bedtime.    rosuvastatin (CRESTOR) 20 MG tablet Take 1 tablet (20 mg total) by mouth at bedtime.    tamsulosin (FLOMAX) 0.4 MG  CAPS capsule Take 0.4 mg by mouth daily after supper.    cyclobenzaprine (FLEXERIL) 5 MG tablet Take 2 tablets (10 mg total) by mouth 2 (two) times daily as needed for up to 20 doses for muscle spasms. (Patient not taking: Reported on 05/14/2021) 05/14/2021: No longer takes   Insulin Pen Needle 31G X 5 MM MISC 1 each by Does not apply route daily. Please use a new pen needle daily with Lantus injection    Red Yeast Rice 600 MG CAPS Take by mouth daily at 6 (six) AM. 34/91/7915: Duplicate   No facility-administered encounter medications on file as of 05/14/2021.    Patient Active Problem List   Diagnosis Date Noted   Near syncope 03/16/2021   Clotting disorder ([place]) 03/16/2021   Orthostatic hypotension 10/01/2020   TIA (transient ischemic attack) 08/31/2020   Normocytic anemia 08/31/2020   Prolonged QT interval 08/31/2020   Acute CVA (cerebrovascular accident) ([place]) 08/31/2020   Debility 08/21/2020   Lytic bone lesion of femur 08/21/2020   Sick sinus syndrome ([place]) 07/31/2020   Coronary artery disease s/p CABG 2001 07/17/2020   Hypothyroidism 07/17/2020   Venous thromboembolism (VTE) 07/17/2020   Hypertension 07/17/2020   Cardiac pacemaker 07/17/2020   Osteoarthritis 07/17/2020   Type 2 diabetes mellitus with hyperglycemia ([place]) 07/17/2020   Vitamin D deficiency 07/17/2020   Macular degeneration 07/17/2020   Dyslipidemia due to type 2 diabetes mellitus ([place]) 07/17/2020   Insomnia 07/17/2020   BPH (benign prostatic hyperplasia) 07/17/2020   Colon polyps 07/17/2020    Conditions to be addressed/monitored:HTN and DMII  Care Plan : [place] (Adult)  Updates made by Leona Singleton, RN since 05/17/2021 12:00 AM     Problem: Knowledge deficit of wife caring for patient with diabetes and HTN   Priority: Medium     Long-Range Goal: Wife will work with CCM team to gain knowledge about chronic care conditions.   Start Date: 05/21/2021  Expected End Date:  05/21/2022  Priority: Medium  Note:   Current Barriers:  Knowledge Deficits related to plan of care for management of HTN and DMII  Chronic Disease Management support and education needs related to HTN and DMII  Spoke with wife per patient request and patient very hard of hearing.  Wife reports patient is doing well.  Denies any [name], shortness of breath or swelling.  Last blood sugar was 145 prior to dinner.  Admits to not monitoring blood pressures,  RNCM Clinical Goal(s):  Patient will verbalize understanding of plan for management of HTN and DMII as evidenced by checking blood pressures and blood sugars verbalize basic understanding of HTN and DMII disease process and self health management plan as evidenced by maintaining Hgb A1C of below 7.5  through collaboration with RN Care manager, provider, and care team.   Interventions: 1:1 collaboration with primary care provider regarding development and update of comprehensive plan of care as evidenced by provider attestation and co-signature Inter-disciplinary care team collaboration (see longitudinal plan of care) Evaluation of current  treatment plan related to  self management and patient's adherence to plan as established by provider   Diabetes:  (Status: New goal.) Long Term Goal   Lab Results  Component Value Date   HGBA1C 7.4 (H) 03/16/2021  Assessed patient's understanding of A1c goal: <8% Provided education to patient about basic DM disease process; Reviewed medications with patient and discussed importance of medication adherence;        Counseled on importance of regular laboratory monitoring as prescribed;        Discussed plans with patient for ongoing care management follow up and provided patient with direct contact information for care management team;      Provided patient with written educational materials related to hypo and hyperglycemia and importance of correct treatment;       Advised patient, providing education and  rationale, to check cbg twice daily and when you have symptoms of low or high blood sugar and record        call provider for findings outside established parameters;       Screening for signs and symptoms of depression related to chronic disease state;        Assessed social determinant of health barriers;         Hypertension: (Status: New goal.)  Long Term Goal Last practice recorded BP readings:  BP Readings from Last 3 Encounters:  04/28/21 120/70  04/14/21 140/68  04/09/21 115/65  Most recent eGFR/CrCl:  Lab Results  Component Value Date   EGFR 65 04/28/2021    No components found for: CRCL  Evaluation of current treatment plan related to hypertension self management and patient's adherence to plan as established by provider;   Provided education to patient re: stroke prevention, s/s of heart attack and stroke; Reviewed prescribed diet low salt carb modified Reviewed medications with patient and discussed importance of compliance;  Advised patient, providing education and rationale, to monitor blood pressure daily and record, calling PCP for findings outside established parameters;  Discussed complications of poorly controlled blood pressure such as heart disease, stroke, circulatory complications, vision complications, kidney impairment, sexual dysfunction;   Patient Goals/Self-Care Activities: Take medications as prescribed   Attend all scheduled provider appointments Call provider office for new concerns or questions  check blood sugar at prescribed times: twice daily check feet daily for cuts, sores or redness enter blood sugar readings and medication or insulin into daily log take the blood sugar log to all doctor visits check blood pressure 3 times per week write blood pressure results in a log or diary take blood pressure log to all doctor appointments call doctor for signs and symptoms of high blood pressure develop an action plan for high blood pressure keep all  doctor appointments take medications for blood pressure exactly as prescribed report new symptoms to your doctor       Plan:The care management team will reach out to the patient again over the next 45 days.  Hubert Azure RN, MSN RN Care Management Coordinator  [place] [phone] [email or web address]

## 2021-05-17 NOTE — Patient Instructions (Signed)
Visit Information   Thank you for taking time to visit with me today. Please don't hesitate to contact me if I can be of assistance to you before our next scheduled telephone appointment.  Following are the goals we discussed today:  Take medications as prescribed   Attend all scheduled provider appointments Call provider office for new concerns or questions  check blood sugar at prescribed times: twice daily check feet daily for cuts, sores or redness enter blood sugar readings and medication or insulin into daily log take the blood sugar log to all doctor visits check blood pressure 3 times per week write blood pressure results in a log or diary take blood pressure log to all doctor appointments call doctor for signs and symptoms of high blood pressure develop an action plan for high blood pressure keep all doctor appointments take medications for blood pressure exactly as prescribed report new symptoms to your doctor  Our next appointment is by telephone on 06/25/21 at 12 pm  Please call the care guide team at [phone] if you need to cancel or reschedule your appointment.   Please call 911 go to [facility] [place] [phone]) call the Suicide and Crisis Lifeline: 988 call the [place] National Suicide Prevention Lifeline: (365)851-4645 call 1-800-273-TALK (toll free, 24 hour hotline) if you are experiencing a Mental Health or [place] or need someone to talk to.  Following is a copy of your full care plan:  Care Plan : [place] of Care (Adult)  Updates made by Leona Singleton, RN since 05/17/2021 12:00 AM     Problem: Knowledge deficit of wife caring for patient with diabetes and HTN   Priority: Medium     Long-Range Goal: Wife will work with CCM team to gain knowledge about chronic care conditions.   Start Date: 05/21/2021  Expected End Date: 05/21/2022  Priority: Medium  Note:    Current Barriers:  Knowledge Deficits related to plan of care for management of HTN and DMII  Chronic Disease Management support and education needs related to HTN and DMII  Spoke with wife per patient request and patient very hard of hearing.  Wife reports patient is doing well.  Denies any pain, shortness of breath or swelling.  Last blood sugar was 145 prior to dinner.  Admits to not monitoring blood pressures,  RNCM Clinical Goal(s):  Patient will verbalize understanding of plan for management of HTN and DMII as evidenced by checking blood pressures and blood sugars verbalize basic understanding of HTN and DMII disease process and self health management plan as evidenced by maintaining Hgb A1C of below 7.5  through collaboration with RN Care manager, provider, and care team.   Interventions: 1:1 collaboration with primary care provider regarding development and update of comprehensive plan of care as evidenced by provider attestation and co-signature Inter-disciplinary care team collaboration (see longitudinal plan of care) Evaluation of current treatment plan related to  self management and patient's adherence to plan as established by provider   Diabetes:  (Status: New goal.) Long Term Goal   Lab Results  Component Value Date   HGBA1C 7.4 (H) 03/16/2021  Assessed patient's understanding of A1c goal: <8% Provided education to patient about basic DM disease process; Reviewed medications with patient and discussed importance of medication adherence;        Counseled on importance of regular laboratory monitoring as prescribed;        Discussed plans with  patient for ongoing care management follow up and provided patient with direct contact information for care management team;      Provided patient with written educational materials related to hypo and hyperglycemia and importance of correct treatment;       Advised patient, providing education and rationale, to check cbg twice daily  and when you have symptoms of low or high blood sugar and record        call provider for findings outside established parameters;       Screening for signs and symptoms of depression related to chronic disease state;        Assessed social determinant of health barriers;         Hypertension: (Status: New goal.)  Long Term Goal Last practice recorded BP readings:  BP Readings from Last 3 Encounters:  04/28/21 120/70  04/14/21 140/68  04/09/21 115/65  Most recent eGFR/CrCl:  Lab Results  Component Value Date   EGFR 65 04/28/2021    No components found for: CRCL  Evaluation of current treatment plan related to hypertension self management and patient's adherence to plan as established by provider;   Provided education to patient re: stroke prevention, s/s of heart attack and stroke; Reviewed prescribed diet low salt carb modified Reviewed medications with patient and discussed importance of compliance;  Advised patient, providing education and rationale, to monitor blood pressure daily and record, calling PCP for findings outside established parameters;  Discussed complications of poorly controlled blood pressure such as heart disease, stroke, circulatory complications, vision complications, kidney impairment, sexual dysfunction;   Patient Goals/Self-Care Activities: Take medications as prescribed   Attend all scheduled provider appointments Call provider office for new concerns or questions  check blood sugar at prescribed times: twice daily check feet daily for cuts, sores or redness enter blood sugar readings and medication or insulin into daily log take the blood sugar log to all doctor visits check blood pressure 3 times per week write blood pressure results in a log or diary take blood pressure log to all doctor appointments call doctor for signs and symptoms of high blood pressure develop an action plan for high blood pressure keep all doctor appointments take medications  for blood pressure exactly as prescribed report new symptoms to your doctor       Consent to CCM Services: Thomas [name] was given information about Chronic Care Management services including:  CCM service includes personalized support from designated clinical staff supervised by Thomas [name], including individualized plan of care and coordination with other care providers 24/7 contact phone numbers for assistance for urgent and routine care needs. Service will only be billed when office clinical staff spend 20 minutes or more in a month to coordinate care. Only one practitioner may furnish and bill the service in a calendar month. The patient may stop CCM services at any time (effective at the end of the month) by phone call to the office staff. The patient will be responsible for cost sharing (co-pay) of up to 20% of the service fee (after annual deductible is met).  Patient agreed to services and verbal consent obtained.   Patient verbalizes understanding of instructions provided today and agrees to view in [place].   The care management team will reach out to the patient again over the next 45 days.

## 2021-05-18 DIAGNOSIS — Z951 Presence of aortocoronary bypass graft: Secondary | ICD-10-CM | POA: Diagnosis not present

## 2021-05-18 DIAGNOSIS — M1610 Unilateral primary osteoarthritis, unspecified hip: Secondary | ICD-10-CM | POA: Diagnosis not present

## 2021-05-18 DIAGNOSIS — I495 Sick sinus syndrome: Secondary | ICD-10-CM | POA: Diagnosis not present

## 2021-05-18 DIAGNOSIS — I69351 Hemiplegia and hemiparesis following cerebral infarction affecting right dominant side: Secondary | ICD-10-CM | POA: Diagnosis not present

## 2021-05-18 DIAGNOSIS — E785 Hyperlipidemia, unspecified: Secondary | ICD-10-CM | POA: Diagnosis not present

## 2021-05-18 DIAGNOSIS — H3530 Unspecified macular degeneration: Secondary | ICD-10-CM | POA: Diagnosis not present

## 2021-05-18 DIAGNOSIS — I951 Orthostatic hypotension: Secondary | ICD-10-CM | POA: Diagnosis not present

## 2021-05-18 DIAGNOSIS — G4700 Insomnia, unspecified: Secondary | ICD-10-CM | POA: Diagnosis not present

## 2021-05-18 DIAGNOSIS — E1169 Type 2 diabetes mellitus with other specified complication: Secondary | ICD-10-CM | POA: Diagnosis not present

## 2021-05-18 DIAGNOSIS — I10 Essential (primary) hypertension: Secondary | ICD-10-CM | POA: Diagnosis not present

## 2021-05-18 DIAGNOSIS — D649 Anemia, unspecified: Secondary | ICD-10-CM | POA: Diagnosis not present

## 2021-05-18 DIAGNOSIS — Z950 Presence of cardiac pacemaker: Secondary | ICD-10-CM | POA: Diagnosis not present

## 2021-05-18 DIAGNOSIS — E039 Hypothyroidism, unspecified: Secondary | ICD-10-CM | POA: Diagnosis not present

## 2021-05-18 DIAGNOSIS — N400 Enlarged prostate without lower urinary tract symptoms: Secondary | ICD-10-CM | POA: Diagnosis not present

## 2021-05-18 DIAGNOSIS — E559 Vitamin D deficiency, unspecified: Secondary | ICD-10-CM | POA: Diagnosis not present

## 2021-05-18 DIAGNOSIS — I2510 Atherosclerotic heart disease of native coronary artery without angina pectoris: Secondary | ICD-10-CM | POA: Diagnosis not present

## 2021-05-20 ENCOUNTER — Other Ambulatory Visit: Payer: Self-pay | Admitting: Family Medicine

## 2021-05-27 ENCOUNTER — Telehealth: Payer: Self-pay

## 2021-05-27 DIAGNOSIS — I10 Essential (primary) hypertension: Secondary | ICD-10-CM

## 2021-05-27 DIAGNOSIS — Z794 Long term (current) use of insulin: Secondary | ICD-10-CM | POA: Diagnosis not present

## 2021-05-27 DIAGNOSIS — E1165 Type 2 diabetes mellitus with hyperglycemia: Secondary | ICD-10-CM

## 2021-05-27 NOTE — Telephone Encounter (Signed)
Ok with me. Please place any necessary orders. 

## 2021-05-27 NOTE — Telephone Encounter (Signed)
Thomas [name] from [facility] called needing verbal orders for 3 more physical therapy appts. Maurine Minister can be reached at [phone]. Please Advise.

## 2021-05-27 NOTE — Telephone Encounter (Signed)
Okay to give verbals? 

## 2021-05-28 IMAGING — CT CT HEAD W/O CM
3 series · 15 of 47 positions shown, 18 images · non-contrast
Comparison: August 31, 2020

CLINICAL DATA: Syncopal episode.  Minor head trauma.

EXAM:
CT HEAD WITHOUT CONTRAST
TECHNIQUE: Contiguous axial images were obtained from the base of the skull
through the vertex without intravenous contrast.

[Series 2: head wo · axial · 0.42mm/px · z∈[+105,+235]mm · 9 of 32 slices shown, 12 images]
[im 3/32  brain]
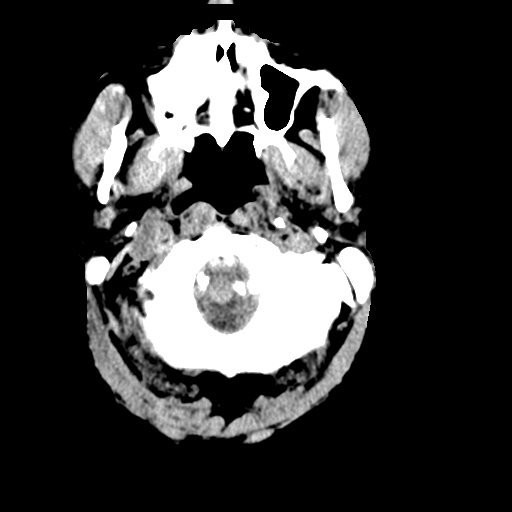
[im 3/32  bone]
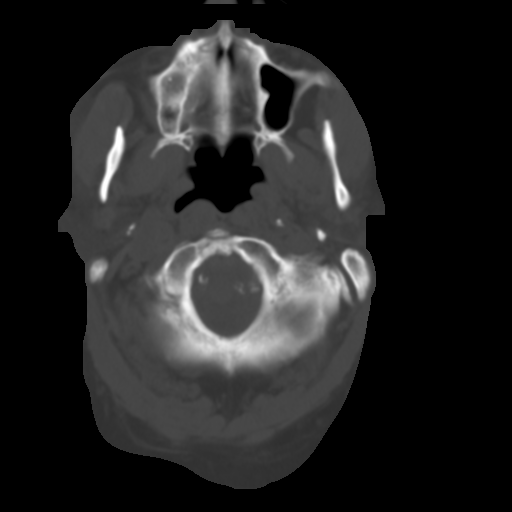
[im 6/32  brain]
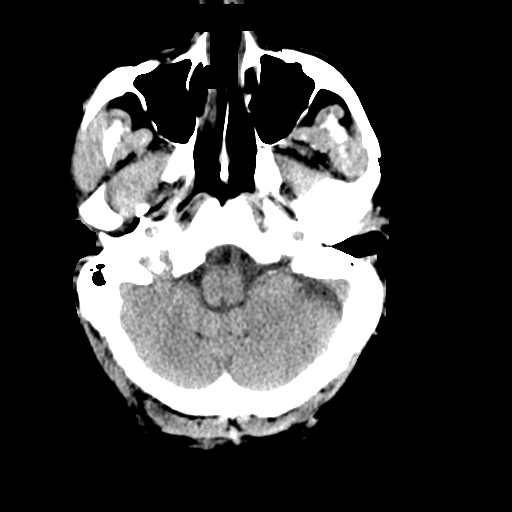
[im 9/32  brain]
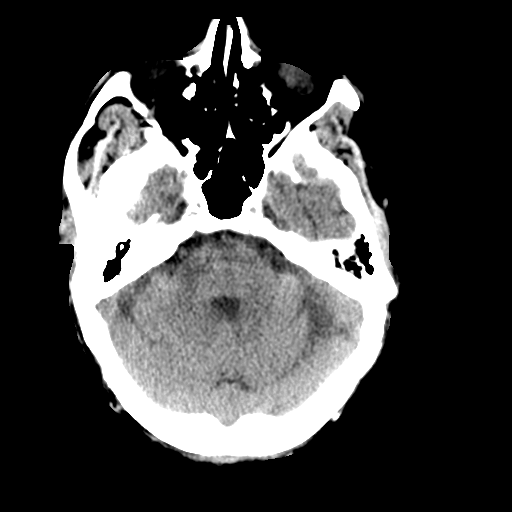
[im 12/32  brain]
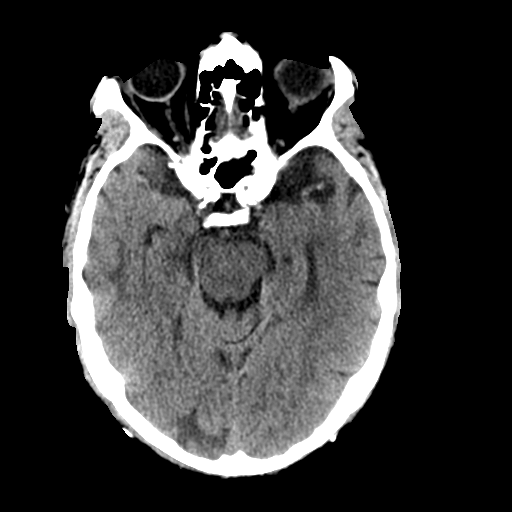
[im 17/32  brain]
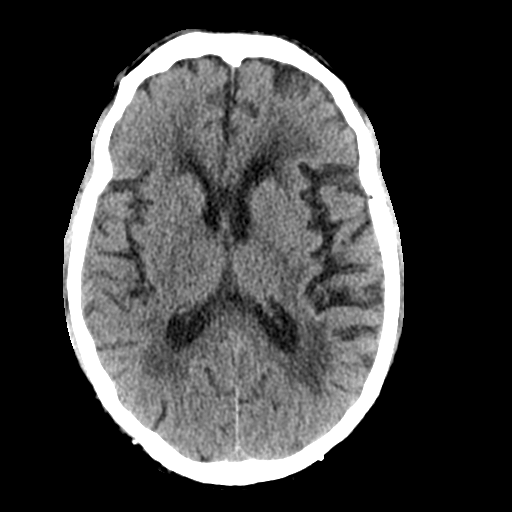
[im 17/32  bone]
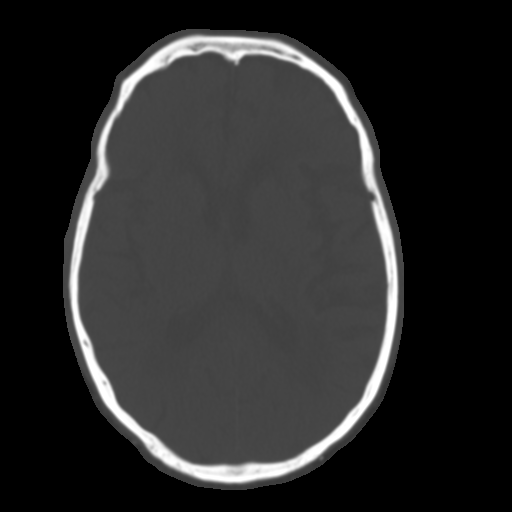
[im 20/32  brain]
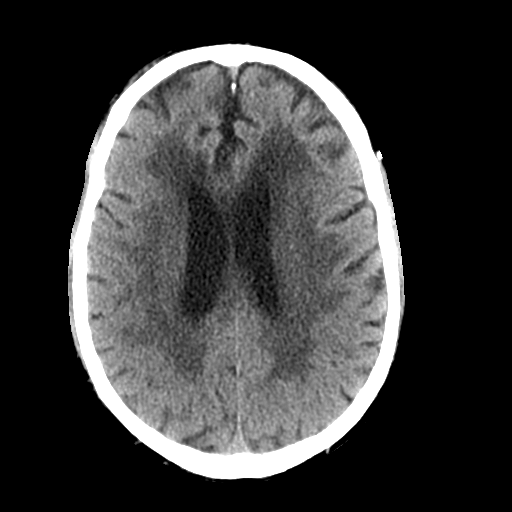
[im 23/32  brain]
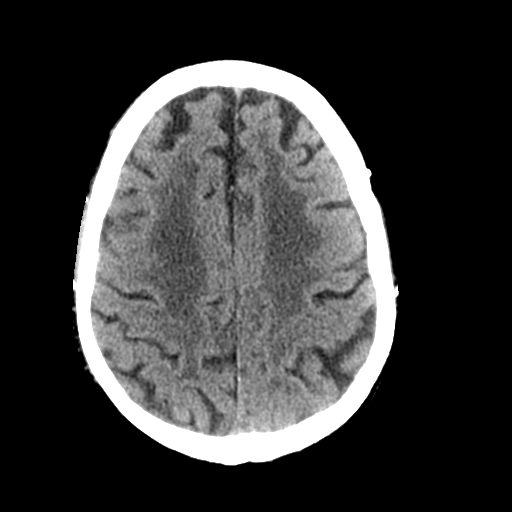
[im 26/32  brain]
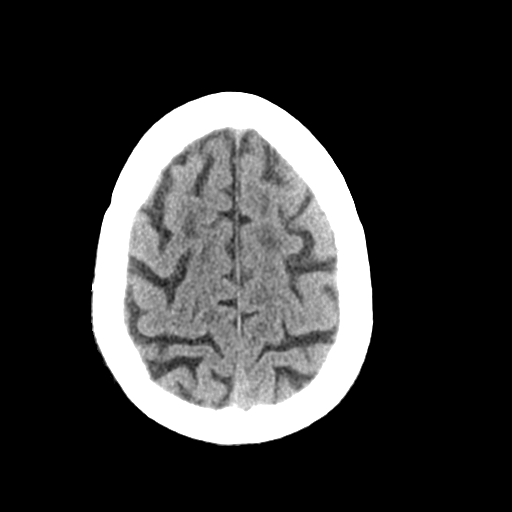
[im 29/32  brain]
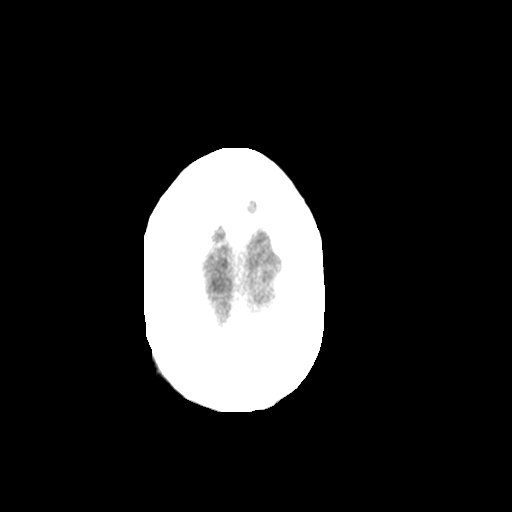
[im 29/32  bone]
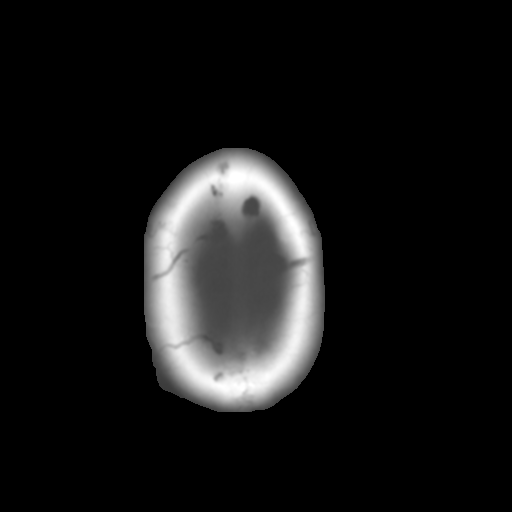

[Series 4: coronal soft tissue · coronal · 0.32mm/px · 3 of 72 slices shown]
[im 24/72  brain]
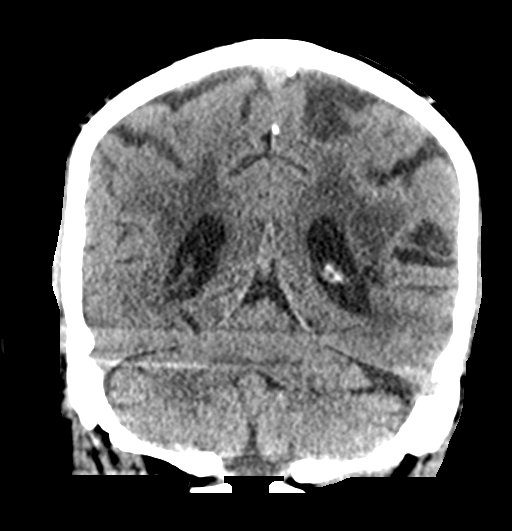
[im 32/72  brain]
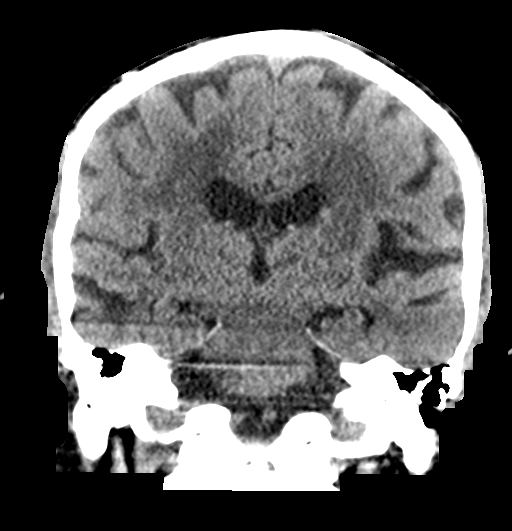
[im 40/72  brain]
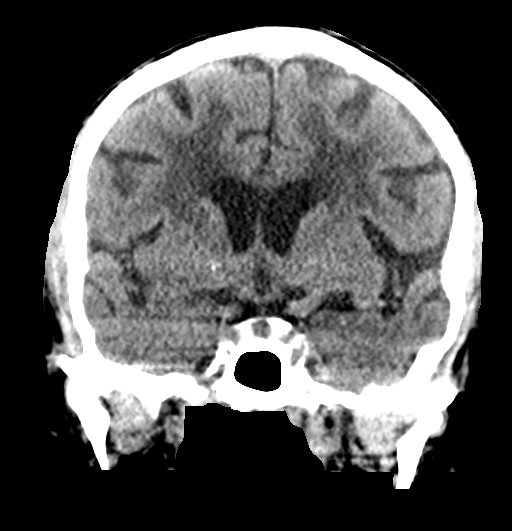

[Series 5: sagittal soft tissue · sagittal · 0.31mm/px · 3 of 57 slices shown]
[im 19/57  brain]
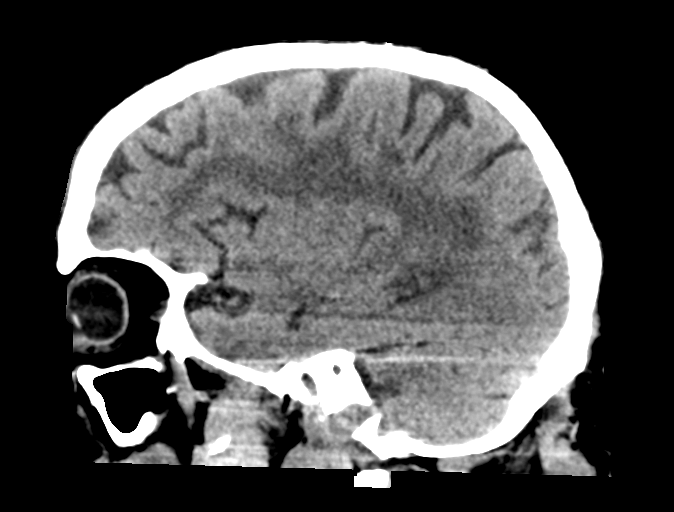
[im 29/57  brain]
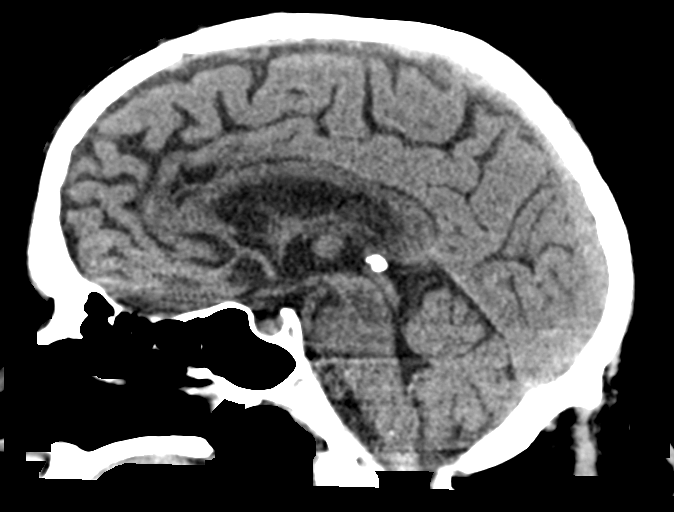
[im 38/57  brain]
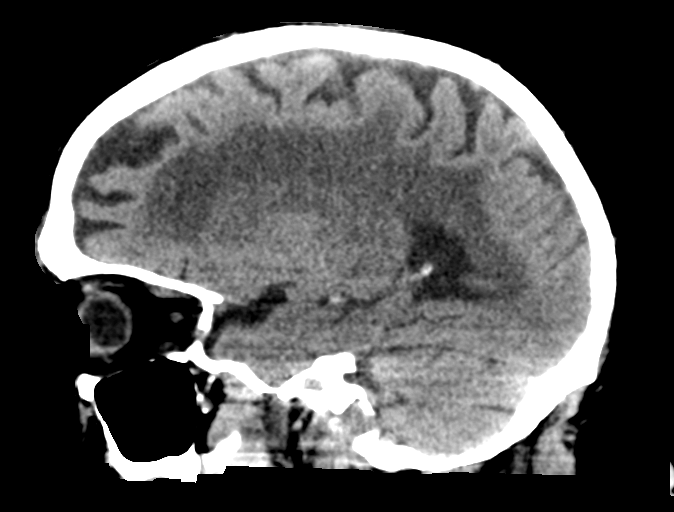

[15 of 47 positions shown; findings below may reference images not displayed]

FINDINGS: Brain: No evidence of acute infarction, hemorrhage, hydrocephalus,
extra-axial collection or mass lesion/mass effect. Chronic diffuse
atrophy and chronic bilateral periventricular white matter small
vessel ischemic changes are identified. Chronic encephalomalacia of
the left temporal lobe is unchanged. Mild chronic encephalomalacia
of the right temporal lobe is unchanged. Small lacunar infarctions
identified in the bilateral thalamus and right basal ganglia.

Vascular: No hyperdense vessel is noted.

Skull: Normal. Negative for fracture or focal lesion.

Sinuses/Orbits: Right maxillary sinus retention cyst is noted. The
orbits are normal.

Other: None.
IMPRESSION: 1. No focal acute intracranial abnormality identified.
2. Chronic diffuse atrophy and chronic bilateral periventricular
white matter small vessel ischemic change.
3. Chronic encephalomalacia of bilateral temporal lobes, left
greater than right, unchanged.

## 2021-05-28 NOTE — Telephone Encounter (Signed)
Left VO with VO

## 2021-06-10 ENCOUNTER — Telehealth: Payer: Self-pay

## 2021-06-10 NOTE — Telephone Encounter (Signed)
Thomas [name] from [facility] is calling in stating that Thomas [name] fell today, EMS was called no injuries were reported.

## 2021-06-11 ENCOUNTER — Other Ambulatory Visit: Payer: Medicare Other

## 2021-06-11 ENCOUNTER — Other Ambulatory Visit: Payer: Self-pay | Admitting: Physician Assistant

## 2021-06-11 DIAGNOSIS — Z79899 Other long term (current) drug therapy: Secondary | ICD-10-CM | POA: Diagnosis not present

## 2021-06-11 NOTE — Telephone Encounter (Signed)
Called pt and spoke w/ wife Thomas [name]; pt will keep appt 07/13/21 to see Dr [name].

## 2021-06-11 NOTE — Telephone Encounter (Signed)
Noted. If no injuries he can wait but I would like for him to follow up with [place] soon.  Katina Degree. [name], MD 06/11/2021 10:43 AM

## 2021-06-11 NOTE — Telephone Encounter (Signed)
Please see note.

## 2021-06-12 LAB — TSH: TSH: 4.9 u[IU]/mL — ABNORMAL HIGH (ref 0.450–4.500)

## 2021-06-23 ENCOUNTER — Other Ambulatory Visit: Payer: Self-pay | Admitting: Family Medicine

## 2021-06-25 ENCOUNTER — Ambulatory Visit (INDEPENDENT_AMBULATORY_CARE_PROVIDER_SITE_OTHER): Payer: Medicare Other | Admitting: *Deleted

## 2021-06-25 DIAGNOSIS — E1165 Type 2 diabetes mellitus with hyperglycemia: Secondary | ICD-10-CM

## 2021-06-25 DIAGNOSIS — I10 Essential (primary) hypertension: Secondary | ICD-10-CM

## 2021-06-25 NOTE — Patient Instructions (Addendum)
Visit Information  Thank you for taking time to visit with me today. Please don't hesitate to contact me if I can be of assistance to you before our next scheduled telephone appointment.  Following are the goals we discussed today:  Take medications as prescribed   Attend all scheduled provider appointments Call provider office for new concerns or questions  check blood sugar at prescribed times: once daily check feet daily for cuts, sores or redness enter blood sugar readings and medication or insulin into daily log take the blood sugar log to all doctor visits check blood pressure 3 times per week write blood pressure results in a log or diary take blood pressure log to all doctor appointments call doctor for signs and symptoms of high blood pressure develop an action plan for high blood pressure keep all doctor appointments take medications for blood pressure exactly as prescribed report new symptoms to your doctor  Our next appointment is by telephone on 2/9 at 1000  Please call the care guide team at [phone] if you need to cancel or reschedule your appointment.   If you are experiencing a Mental Health or Behavioral Health Crisis or need someone to talk to, please call the Suicide and Crisis Lifeline: 988 call the [place] National Suicide Prevention Lifeline: 323 072 9921 or TTY: (662)264-1341 TTY 206-657-2382) to talk to a trained counselor call 1-800-273-TALK (toll free, 24 hour hotline) go to [facility] [address], [place] [phone]) call 911   Patient verbalizes understanding of instructions provided today and agrees to view in MyChart.   Rhae Lerner RN, MSN RN Care Management Coordinator  [facility] [place] [phone] [email or web address]

## 2021-06-25 NOTE — Chronic Care Management (AMB) (Signed)
Chronic Care Management   CCM RN Visit Note  06/25/2021 Name: Thomas [name] MRN: [id number] DOB: [birth year]  Subjective: Thomas [name] is a 87 y.o. year old male who is a primary care patient of [name], [name], MD. The care management team was consulted for assistance with disease management and care coordination needs.    The patient was given information about Chronic Care Management services, agreed to services, and gave verbal consent prior to initiation of services.  Please see initial visit note for detailed documentation.  for follow up visit in response to provider referral for case management and/or care coordination services.   Consent to Services:  The patient was given information about Chronic Care Management services, agreed to services, and gave verbal consent prior to initiation of services.  Please see initial visit note for detailed documentation.   Patient agreed to services and verbal consent obtained.   Assessment: Review of patient past medical history, allergies, medications, health status, including review of consultants reports, laboratory and other test data, was performed as part of comprehensive evaluation and provision of chronic care management services.   SDOH (Social Determinants of Health) assessments and interventions performed:    CCM Care Plan  Allergies  Allergen Reactions   Amitriptyline Shortness Of Breath and Palpitations   Lyrica [Pregabalin] Nausea And Vomiting   Pravachol [Pravastatin] Other (See Comments)    Muscle cramps and aches    Effexor Xr [Venlafaxine] Anxiety   Morphine Palpitations   Zolpidem Anxiety and Palpitations    Outpatient Encounter Medications as of 06/25/2021  Medication Sig Note   acetaminophen (TYLENOL) 325 MG tablet Take 650 mg by mouth every 6 (six) hours as needed for moderate pain or headache.    amiodarone (PACERONE) 200 MG tablet Take 1 tablet (200 mg total) by mouth daily.    apixaban (ELIQUIS) 5 MG  TABS tablet Take 1 tablet (5 mg total) by mouth 2 (two) times daily.    carvedilol (COREG) 3.125 MG tablet Take 1 tablet (3.125 mg total) by mouth 2 (two) times daily with a meal.    cholecalciferol (VITAMIN D3) 25 MCG (1000 UNIT) tablet Take 1,000 Units by mouth daily.    co-enzyme Q-10 50 MG capsule Take 50 mg by mouth daily.    cyclobenzaprine (FLEXERIL) 5 MG tablet Take 2 tablets (10 mg total) by mouth 2 (two) times daily as needed for up to 20 doses for muscle spasms. (Patient not taking: Reported on 05/14/2021) 05/14/2021: No longer takes   ferrous sulfate 325 (65 FE) MG EC tablet TAKE 1 TABLET(325 MG) BY MOUTH DAILY WITH BREAKFAST    glucosamine-chondroitin 500-400 MG tablet Take 1 tablet by mouth 3 (three) times daily.    insulin glargine (LANTUS) 100 unit/mL SOPN Inject 15 Units into the skin daily.    Insulin Pen Needle 31G X 5 MM MISC 1 each by Does not apply route daily. Please use a new pen needle daily with Lantus injection    levothyroxine (SYNTHROID) 100 MCG tablet TAKE 1 TABLET(100 MCG) BY MOUTH DAILY    midodrine (PROAMATINE) 2.5 MG tablet Take 1 tablet (2.5 mg total) by mouth 3 (three) times daily with meals.    mirtazapine (REMERON) 7.5 MG tablet TAKE 1 TABLET(7.5 MG) BY MOUTH AT BEDTIME    Multiple Vitamin (MULTIVITAMIN) tablet Take 1 tablet by mouth daily.    Red Yeast Rice 600 MG CAPS Take by mouth daily at 6 (six) AM. 16/38/4536: Duplicate   Red Yeast Rice Extract (RED YEAST  RICE PO) Take 1 capsule by mouth in the morning and at bedtime.    rosuvastatin (CRESTOR) 20 MG tablet Take 1 tablet (20 mg total) by mouth at bedtime.    tamsulosin (FLOMAX) 0.4 MG CAPS capsule Take 0.4 mg by mouth daily after supper.    No facility-administered encounter medications on file as of 06/25/2021.    Patient Active Problem List   Diagnosis Date Noted   Near syncope 03/16/2021   Clotting disorder ([place]) 03/16/2021   Orthostatic hypotension 10/01/2020   TIA (transient ischemic attack)  08/31/2020   Normocytic anemia 08/31/2020   Prolonged QT interval 08/31/2020   Acute CVA (cerebrovascular accident) ([place]) 08/31/2020   Debility 08/21/2020   Lytic bone lesion of femur 08/21/2020   Sick sinus syndrome ([place]) 07/31/2020   Coronary artery disease s/p CABG 2001 07/17/2020   Hypothyroidism 07/17/2020   Venous thromboembolism (VTE) 07/17/2020   Hypertension 07/17/2020   Cardiac pacemaker 07/17/2020   Osteoarthritis 07/17/2020   Type 2 diabetes mellitus with hyperglycemia ([place]) 07/17/2020   Vitamin D deficiency 07/17/2020   Macular degeneration 07/17/2020   Dyslipidemia due to type 2 diabetes mellitus ([place]) 07/17/2020   Insomnia 07/17/2020   BPH (benign prostatic hyperplasia) 07/17/2020   Colon polyps 07/17/2020    Conditions to be addressed/monitored:HTN and DMII  Care Plan : [place] (Adult)  Updates made by Leona Singleton, RN since 06/25/2021 12:00 AM     Problem: Knowledge deficit of wife caring for patient with diabetes and HTN   Priority: Medium     Long-Range Goal: Wife will work with CCM team to gain knowledge about chronic care conditions.   Start Date: 05/21/2021  Expected End Date: 05/21/2022  Priority: Medium  Note:   Current Barriers:  Knowledge Deficits related to plan of care for management of HTN and DMII  Chronic Disease Management support and education needs related to HTN and DMII  Spoke with wife per patient request and patient very hard of hearing.  Wife reports patient is doing well.  Denies any pain, shortness of breath or swelling.  Last blood sugar was 145 with recent ranges of 130-140's; BP ranges 120-10/80's.  Does acknowledge patient had fall in  the home without injury last month.  Wife states patient does well with home health therapy and would like for pateint to resume after the new year.  MD [place] THERAPY IF YOU AGREE.  RNCM Clinical Goal(s):  Patient will verbalize understanding of plan for  management of HTN and DMII as evidenced by checking blood pressures and blood sugars verbalize basic understanding of HTN and DMII disease process and self health management plan as evidenced by maintaining Hgb A1C of below 7.5  through collaboration with RN Care manager, provider, and care team.   Interventions: 1:1 collaboration with primary care provider regarding development and update of comprehensive plan of care as evidenced by provider attestation and co-signature Inter-disciplinary care team collaboration (see longitudinal plan of care) Evaluation of current treatment plan related to  self management and patient's adherence to plan as established by provider   Diabetes:  (Status: Goal on Track (progressing): YES.) Long Term Goal   Lab Results  Component Value Date   HGBA1C 7.4 (H) 03/16/2021  Assessed patient's understanding of A1c goal: <8% Provided education to patient about basic DM disease process; Reviewed medications with patient and discussed importance of medication adherence;        Discussed plans with patient for ongoing  care management follow up and provided patient with direct contact information for care management team;      Provided patient with written educational materials related to hypo and hyperglycemia and importance of correct treatment;       Advised patient, providing education and rationale, to check cbg once daily and when you have symptoms of low or high blood sugar and record        call provider for findings outside established parameters;       Reviewed current glucose ranges and discussed ways to keep them in range  Hypertension: (Status: Goal on Track (progressing): YES.)  Long Term Goal Last practice recorded BP readings:  BP Readings from Last 3 Encounters:  04/28/21 120/70  04/14/21 140/68  04/09/21 115/65  Most recent eGFR/CrCl:  Lab Results  Component Value Date   EGFR 65 04/28/2021    No components found for: CRCL  Evaluation of  current treatment plan related to hypertension self management and patient's adherence to plan as established by provider;   Provided education to patient re: stroke prevention, s/s of heart attack and stroke; Reviewed prescribed diet low salt carb modified Reviewed medications with patient and discussed importance of compliance;  Advised patient, providing education and rationale, to monitor blood pressure daily and record, calling PCP for findings outside established parameters;  Discussed complications of poorly controlled blood pressure such as heart disease, stroke, circulatory complications, vision complications, kidney impairment, sexual dysfunction;  Encouraged to keep blood pressure log  Patient Goals/Self-Care Activities: Take medications as prescribed   Attend all scheduled provider appointments Call provider office for new concerns or questions  check blood sugar at prescribed times: once daily check feet daily for cuts, sores or redness enter blood sugar readings and medication or insulin into daily log take the blood sugar log to all doctor visits check blood pressure 3 times per week write blood pressure results in a log or diary take blood pressure log to all doctor appointments call doctor for signs and symptoms of high blood pressure develop an action plan for high blood pressure keep all doctor appointments take medications for blood pressure exactly as prescribed report new symptoms to your doctor       Plan:The care management team will reach out to the patient again over the next 45 days.  Hubert Azure RN, MSN RN Care Management Coordinator  [facility] [phone] [email or web address]

## 2021-06-27 DIAGNOSIS — Z794 Long term (current) use of insulin: Secondary | ICD-10-CM

## 2021-06-27 DIAGNOSIS — E1165 Type 2 diabetes mellitus with hyperglycemia: Secondary | ICD-10-CM

## 2021-06-27 DIAGNOSIS — I10 Essential (primary) hypertension: Secondary | ICD-10-CM | POA: Diagnosis not present

## 2021-07-03 ENCOUNTER — Telehealth: Payer: Self-pay | Admitting: Pharmacist

## 2021-07-03 NOTE — Chronic Care Management (AMB) (Addendum)
Chronic Care Management Pharmacy Assistant   Name: Thomas [name]  MRN: [id number] DOB: 08-23-1928   Reason for Encounter: Hypertension Adherence Call    Recent office visits:  04/09/2021 OV (PCP) Ardith Dark, MD; no medication changes indicated.  Recent consult visits:  04/28/2021 OV (Cardiology) Sheilah Pigeon, PA-C; no medication changes indicated.  04/14/2021 OV (Cardiology) Alver Sorrow, NP; no medication changes indicated.  Hospital visits:  04/03/2021 ED Visit for Hip Pain Cyclobenzaprine 5 mg 2 times daily PRN  03/16/2021 ED to Hospital Admission due to Near syncope Admit date: 03/16/2021 Discharge date: 03/23/2021 Admitted From: Home Disposition: SNF -No medication changes indicated.  Medications: Outpatient Encounter Medications as of 07/03/2021  Medication Sig Note   acetaminophen (TYLENOL) 325 MG tablet Take 650 mg by mouth every 6 (six) hours as needed for moderate pain or headache.    amiodarone (PACERONE) 200 MG tablet Take 1 tablet (200 mg total) by mouth daily.    apixaban (ELIQUIS) 5 MG TABS tablet Take 1 tablet (5 mg total) by mouth 2 (two) times daily.    carvedilol (COREG) 3.125 MG tablet Take 1 tablet (3.125 mg total) by mouth 2 (two) times daily with a meal.    cholecalciferol (VITAMIN D3) 25 MCG (1000 UNIT) tablet Take 1,000 Units by mouth daily.    co-enzyme Q-10 50 MG capsule Take 50 mg by mouth daily.    cyclobenzaprine (FLEXERIL) 5 MG tablet Take 2 tablets (10 mg total) by mouth 2 (two) times daily as needed for up to 20 doses for muscle spasms. (Patient not taking: Reported on 05/14/2021) 05/14/2021: No longer takes   ferrous sulfate 325 (65 FE) MG EC tablet TAKE 1 TABLET(325 MG) BY MOUTH DAILY WITH BREAKFAST    glucosamine-chondroitin 500-400 MG tablet Take 1 tablet by mouth 3 (three) times daily.    insulin glargine (LANTUS) 100 unit/mL SOPN Inject 15 Units into the skin daily.    Insulin Pen Needle 31G X 5 MM MISC 1 each by Does  not apply route daily. Please use a new pen needle daily with Lantus injection    levothyroxine (SYNTHROID) 100 MCG tablet TAKE 1 TABLET(100 MCG) BY MOUTH DAILY    midodrine (PROAMATINE) 2.5 MG tablet Take 1 tablet (2.5 mg total) by mouth 3 (three) times daily with meals.    mirtazapine (REMERON) 7.5 MG tablet TAKE 1 TABLET(7.5 MG) BY MOUTH AT BEDTIME    Multiple Vitamin (MULTIVITAMIN) tablet Take 1 tablet by mouth daily.    Red Yeast Rice 600 MG CAPS Take by mouth daily at 6 (six) AM. 05/14/2021: Duplicate   Red Yeast Rice Extract (RED YEAST RICE PO) Take 1 capsule by mouth in the morning and at bedtime.    rosuvastatin (CRESTOR) 20 MG tablet Take 1 tablet (20 mg total) by mouth at bedtime.    tamsulosin (FLOMAX) 0.4 MG CAPS capsule Take 0.4 mg by mouth daily after supper.    No facility-administered encounter medications on file as of 07/03/2021.   Reviewed chart prior to disease state call. Spoke with patient regarding BP  Recent Office Vitals: BP Readings from Last 3 Encounters:  04/28/21 120/70  04/14/21 140/68  04/09/21 115/65   Pulse Readings from Last 3 Encounters:  04/28/21 (!) 50  04/14/21 74  04/09/21 74    Wt Readings from Last 3 Encounters:  04/28/21 164 lb 9.6 oz (74.7 kg)  04/03/21 160 lb (72.6 kg)  03/23/21 164 lb 7.4 oz (74.6 kg)  Kidney Function Lab Results  Component Value Date/Time   CREATININE 1.08 04/28/2021 10:45 AM   CREATININE 0.98 04/03/2021 11:50 AM   CREATININE 1.16 08/27/2020 03:26 PM   GFR 52.56 (L) 08/21/2020 11:43 AM   GFRNONAA >60 04/03/2021 11:50 AM   GFRNONAA >60 08/27/2020 03:26 PM   GFRAA 66 08/07/2020 02:37 PM    BMP Latest Ref Rng & Units 04/28/2021 04/03/2021 03/23/2021  Glucose 70 - 99 mg/dL 500(B) 704(U) 889(V)  BUN 10 - 36 mg/dL 18 12 21   Creatinine 0.76 - 1.27 mg/dL 6.94 5.03  BUN/Creat Ratio 10 - 24 17 - -  Sodium 134 - 144 mmol/L 138 138 137  Potassium 3.5 - 5.2 mmol/L 4.5 4.2 4.2  Chloride 96 - 106 mmol/L 103 104 103   CO2 20 - 29 mmol/L 24 25 25   Calcium 8.6 - 10.2 mg/dL 8.6 9.0 8.88)    Current regimen:  Midodrin 2.5 mg three times daily for blood pressure control  How often are you checking your Blood Pressure? daily  Current home BP readings: patient wife states she checks his blood pressure everyday. She states sometimes it's up a little bit but is mostly >140/90. She did not give any exact number.  What recent interventions/DTPs have been made by any provider to improve Blood Pressure control since last CPP Visit: No recent interventions. Patient currently taking Midodrin to help maintain blood pressure control.  What diet changes have been made to improve Blood Pressure Control?  Patients wife states the patient is eating well and has recently gained 10 lbs.  What exercise is being done to improve your Blood Pressure Control?  None at this time.  Adherence Review: Is the patient currently on ACE/ARB medication? No Does the patient have >5 day gap between last estimated fill dates? No   Care Gaps: Medicare Annual Wellness: Due now - pt wife declines to schedule Ophthalmology Exam: Overdue - never done Foot Exam: Overdue - never done Hemoglobin A1C: 7.4% on 03/16/2021 Colonoscopy: Aged out  Future Appointments  Date Time Provider Department Center  07/13/2021 11:20 AM 03/18/2021, MD [facility]  07/30/2021  7:00 AM [facility] DEVICE REMOTES [facility]  08/06/2021 10:00 AM [facility] CARE MGR [facility]  08/25/2021 12:45 PM 10/04/2021, NP [facility] None  09/07/2021  3:00 PM [facility] PHARMACIST [facility]  09/15/2021  2:20 PM 09/09/2021, MD [facility]  10/29/2021  7:00 AM [facility] DEVICE REMOTES [facility]  01/28/2022  7:00 AM [facility] DEVICE REMOTES [facility]   Star Rating Drugs: Lantus last filled 06/12/2021 69 DS Rosuvastatin 20 mg last filled 04/23/2021 90 DS  April D Calhoun, [facility] Clinical  Pharmacist Assistant [phone]   7 minutes spent in review, coordination, and documentation.  Reviewed by: [facility], PharmD Clinical Pharmacist [phone]

## 2021-07-06 ENCOUNTER — Telehealth: Payer: Self-pay | Admitting: *Deleted

## 2021-07-06 NOTE — Telephone Encounter (Signed)
Noted.  Katina Degree. [name], MD 07/06/2021 12:13 PM

## 2021-07-06 NOTE — Telephone Encounter (Signed)
Spoke with patient wife. Stated patient had saltine crackers at Dr office blood pressure was 90/72 before going home Patient doing good now asymptomatic BP 141/70 HR 70 Advise if any changes please call our office or proceed to go to ED

## 2021-07-06 NOTE — Telephone Encounter (Signed)
Agree with him going to the ED if he has acute neurologic changes.  [name]. [name], MD 07/06/2021 10:39 AM

## 2021-07-06 NOTE — Telephone Encounter (Signed)
[facility] office called stated patient in office  With BP low 90/72 patient disoriented  Advise to send patient to ED per Dr [name]  Assistant stated patient refused due to been there not to long ago

## 2021-07-10 DIAGNOSIS — H353231 Exudative age-related macular degeneration, bilateral, with active choroidal neovascularization: Secondary | ICD-10-CM | POA: Diagnosis not present

## 2021-07-10 DIAGNOSIS — H35033 Hypertensive retinopathy, bilateral: Secondary | ICD-10-CM | POA: Diagnosis not present

## 2021-07-10 DIAGNOSIS — E109 Type 1 diabetes mellitus without complications: Secondary | ICD-10-CM | POA: Diagnosis not present

## 2021-07-10 DIAGNOSIS — H43813 Vitreous degeneration, bilateral: Secondary | ICD-10-CM | POA: Diagnosis not present

## 2021-07-13 ENCOUNTER — Ambulatory Visit (INDEPENDENT_AMBULATORY_CARE_PROVIDER_SITE_OTHER): Payer: Medicare Other | Admitting: Family Medicine

## 2021-07-13 ENCOUNTER — Encounter: Payer: Self-pay | Admitting: Family Medicine

## 2021-07-13 ENCOUNTER — Other Ambulatory Visit: Payer: Self-pay

## 2021-07-13 VITALS — BP 172/79 | HR 81 | Temp 98.7°F | Ht 68.0 in | Wt 175.4 lb

## 2021-07-13 DIAGNOSIS — G4700 Insomnia, unspecified: Secondary | ICD-10-CM | POA: Diagnosis not present

## 2021-07-13 DIAGNOSIS — E039 Hypothyroidism, unspecified: Secondary | ICD-10-CM | POA: Diagnosis not present

## 2021-07-13 DIAGNOSIS — Z794 Long term (current) use of insulin: Secondary | ICD-10-CM

## 2021-07-13 DIAGNOSIS — I10 Essential (primary) hypertension: Secondary | ICD-10-CM

## 2021-07-13 DIAGNOSIS — E1165 Type 2 diabetes mellitus with hyperglycemia: Secondary | ICD-10-CM | POA: Diagnosis not present

## 2021-07-13 DIAGNOSIS — D649 Anemia, unspecified: Secondary | ICD-10-CM

## 2021-07-13 DIAGNOSIS — M1990 Unspecified osteoarthritis, unspecified site: Secondary | ICD-10-CM

## 2021-07-13 LAB — POCT GLYCOSYLATED HEMOGLOBIN (HGB A1C): Hemoglobin A1C: 7.3 % — AB (ref 4.0–5.6)

## 2021-07-13 NOTE — Assessment & Plan Note (Signed)
Recent TSH have been slightly elevated though he remains asymptomatic. He was recently started on amiodarone which may explain some of his recent changes. Will continue to monitor and they will let us know if he develops any symptoms.

## 2021-07-13 NOTE — Patient Instructions (Signed)
It was very nice to see you today!  Please increase your dose of mirtazapine to 15 mg daily.  Send a message in a few weeks to let me know how this is working.  Please have your home health company fax over a recertification form.  It is okay for you to stop your iron supplement.  We can recheck in about 3 months.  We will not make any other medication changes today.  We will see back in 3 months.  Come back sooner if needed.  Take care, Dr [name]  PLEASE NOTE:  If you had any lab tests please let us know if you have not heard back within a few days. You may see your results on mychart before we have a chance to review them but we will give you a call once they are reviewed by [place]. If we ordered any referrals today, please let us know if you have not heard from their office within the next week.   Please try these tips to maintain a healthy lifestyle:  Eat at least 3 REAL meals and 1-2 snacks per day.  Aim for no more than 5 hours between eating.  If you eat breakfast, please do so within one hour of getting up.   Each meal should contain half fruits/vegetables, one quarter protein, and one quarter carbs (no bigger than a computer mouse)  Cut down on sweet beverages. This includes juice, soda, and sweet tea.   Drink at least 1 glass of water with each meal and aim for at least 8 glasses per day  Exercise at least 150 minutes every week.

## 2021-07-13 NOTE — Assessment & Plan Note (Signed)
Very labile. Elevated today though had recent very low at his opthalmologist. Goal is to avoid symptomatic lows at this point due to history of syncopal episodes and recurrent falls. He is follows with cardiology for this. He is on coreg 3.125mg  twice daily and midodrine 2.5mg  three times daily.

## 2021-07-13 NOTE — Assessment & Plan Note (Signed)
He has been taking iron supplement.  He had iron panel earlier this year that showed normal ferritin and iron levels but slightly low sat.  We will stop iron supplement and we can recheck in 3 months.  Recent hemoglobins have been stable.

## 2021-07-13 NOTE — Progress Notes (Signed)
° °  Thomas [name] is a 88 y.o. male who presents today for an office visit.  Assessment/Plan:  Chronic Problems Addressed Today: Type 2 diabetes mellitus with hyperglycemia ([name]) A1c well controlled at 7.3. Continue lantus 15 units daily. Recheck in 3 months.   Insomnia Worsened recently. Will increase remeron to 15mg  daily. They will check in with me in a few weeks via mychart.   Hypertension Very labile. Elevated today though had recent very low at his opthalmologist. Goal is to avoid symptomatic lows at this point due to history of syncopal episodes and recurrent falls. He is follows with cardiology for this. He is on coreg 3.125mg  twice daily and midodrine 2.5mg  three times daily.   Hypothyroidism Recent TSH have been slightly elevated though he remains asymptomatic. He was recently started on amiodarone which may explain some of his recent changes. Will continue to monitor and they will let us know if he develops any symptoms.   Normocytic anemia He has been taking iron supplement.  He had iron panel earlier this year that showed normal ferritin and iron levels but slightly low sat.  We will stop iron supplement and we can recheck in 3 months.  Recent hemoglobins have been stable.  Osteoarthritis Symptoms are stable.  He takes Tylenol as needed.  Will avoid NSAIDs due to chronic anticoagulation.     Subjective:  HPI:  See A/p for status of chronic conditions.    He has had intermittent left foot pain.  Located on the bottom of his foot.  Takes Tylenol with some improvement.  Symptoms of been stable over the last several months.       Objective:  Physical Exam: BP (!) 172/79    Pulse 81    Temp 98.7 F (37.1 C) (Temporal)    Ht 5\' 8"  (1.727 m)    Wt 175 lb 6 oz (79.5 kg)    SpO2 98%    BMI 26.67 kg/m   Gen: No acute distress, resting comfortably CV: Regular rate and rhythm with no murmurs appreciated Pulm: Normal work of breathing, clear to auscultation bilaterally with  no crackles, wheezes, or rhonchi MSK: Left foot without abnormality.  Tenderness to palpation along first MTP joint and midfoot. Neuro: Grossly normal, moves all extremities Psych: Normal affect and thought content      Time Spent: 45 minutes of total time was spent on the date of the encounter performing the following actions: chart review prior to seeing the patient including visits with recent specialists obtaining history, performing a medically necessary exam, counseling on the treatment plan, placing orders, and documenting in our EHR.     [name]. [name], MD 07/13/2021 12:06 PM

## 2021-07-13 NOTE — Assessment & Plan Note (Signed)
A1c well controlled at 7.3. Continue lantus 15 units daily. Recheck in 3 months.

## 2021-07-13 NOTE — Assessment & Plan Note (Signed)
Worsened recently. Will increase remeron to 15mg  daily. They will check in with me in a few weeks via mychart.

## 2021-07-13 NOTE — Assessment & Plan Note (Signed)
Symptoms are stable.  He takes Tylenol as needed.  Will avoid NSAIDs due to chronic anticoagulation.

## 2021-07-29 LAB — CUP PACEART REMOTE DEVICE CHECK
Battery Remaining Longevity: 135 mo
Battery Voltage: 3.01 V
Brady Statistic AP VP Percent: 0.07 %
Brady Statistic AP VS Percent: 98.81 %
Brady Statistic AS VP Percent: 0 %
Brady Statistic AS VS Percent: 1.12 %
Brady Statistic RA Percent Paced: 98.95 %
Brady Statistic RV Percent Paced: 0.07 %
Date Time Interrogation Session: 20230131183015
Implantable Lead Implant Date: 20081106
Implantable Lead Implant Date: 20210210
Implantable Lead Location: 753859
Implantable Lead Location: 753860
Implantable Lead Model: 4076
Implantable Lead Model: 4076
Implantable Pulse Generator Implant Date: 20210210
Lead Channel Impedance Value: 285 Ohm
Lead Channel Impedance Value: 361 Ohm
Lead Channel Impedance Value: 361 Ohm
Lead Channel Impedance Value: 532 Ohm
Lead Channel Pacing Threshold Amplitude: 0.5 V
Lead Channel Pacing Threshold Amplitude: 0.875 V
Lead Channel Pacing Threshold Pulse Width: 0.4 ms
Lead Channel Pacing Threshold Pulse Width: 0.4 ms
Lead Channel Sensing Intrinsic Amplitude: 13.75 mV
Lead Channel Sensing Intrinsic Amplitude: 13.75 mV
Lead Channel Sensing Intrinsic Amplitude: 2.25 mV
Lead Channel Sensing Intrinsic Amplitude: 2.25 mV
Lead Channel Setting Pacing Amplitude: 1.5 V
Lead Channel Setting Pacing Amplitude: 2 V
Lead Channel Setting Pacing Pulse Width: 0.4 ms
Lead Channel Setting Sensing Sensitivity: 0.9 mV

## 2021-07-30 ENCOUNTER — Ambulatory Visit (INDEPENDENT_AMBULATORY_CARE_PROVIDER_SITE_OTHER): Payer: Medicare Other

## 2021-07-30 DIAGNOSIS — I495 Sick sinus syndrome: Secondary | ICD-10-CM | POA: Diagnosis not present

## 2021-08-05 NOTE — Progress Notes (Signed)
Remote pacemaker transmission.   

## 2021-08-06 ENCOUNTER — Ambulatory Visit (INDEPENDENT_AMBULATORY_CARE_PROVIDER_SITE_OTHER): Payer: Medicare Other | Admitting: *Deleted

## 2021-08-06 DIAGNOSIS — I10 Essential (primary) hypertension: Secondary | ICD-10-CM

## 2021-08-06 DIAGNOSIS — Z794 Long term (current) use of insulin: Secondary | ICD-10-CM

## 2021-08-06 DIAGNOSIS — E1165 Type 2 diabetes mellitus with hyperglycemia: Secondary | ICD-10-CM

## 2021-08-06 NOTE — Chronic Care Management (AMB) (Signed)
Chronic Care Management   CCM RN Visit Note  08/06/2021 Name: Thomas [name] MRN: [id number] DOB: 1929-03-12  Subjective: Thomas [name] is a 88 y.o. year old male who is a primary care patient of [name], [name], MD. The care management team was consulted for assistance with disease management and care coordination needs.    Engaged with patient by telephone for follow up visit in response to provider referral for case management and/or care coordination services.   Consent to Services:  The patient was given the following information about Chronic Care Management services today, agreed to services, and gave verbal consent: 1. CCM service includes personalized support from designated clinical staff supervised by the primary care provider, including individualized plan of care and coordination with other care providers 2. 24/7 contact phone numbers for assistance for urgent and routine care needs. 3. Service will only be billed when office clinical staff spend 20 minutes or more in a month to coordinate care. 4. Only one practitioner may furnish and bill the service in a calendar month. 5.The patient may stop CCM services at any time (effective at the end of the month) by phone call to the office staff. 6. The patient will be responsible for cost sharing (co-pay) of up to 20% of the service fee (after annual deductible is met). Patient agreed to services and consent obtained.  Patient agreed to services and verbal consent obtained.   Assessment: Review of patient past medical history, allergies, medications, health status, including review of consultants reports, laboratory and other test data, was performed as part of comprehensive evaluation and provision of chronic care management services.   SDOH (Social Determinants of Health) assessments and interventions performed:    CCM Care Plan  Allergies  Allergen Reactions   Amitriptyline Shortness Of Breath and Palpitations   Lyrica  [Pregabalin] Nausea And Vomiting   Pravachol [Pravastatin] Other (See Comments)    Muscle cramps and aches    Effexor Xr [Venlafaxine] Anxiety   Morphine Palpitations   Zolpidem Anxiety and Palpitations    Outpatient Encounter Medications as of 08/06/2021  Medication Sig Note   mirtazapine (REMERON) 7.5 MG tablet TAKE 1 TABLET(7.5 MG) BY MOUTH AT BEDTIME 08/06/2021: Reports dose increased to 15 mg for the last week    acetaminophen (TYLENOL) 325 MG tablet Take 650 mg by mouth every 6 (six) hours as needed for moderate pain or headache.    amiodarone (PACERONE) 200 MG tablet Take 1 tablet (200 mg total) by mouth daily.    apixaban (ELIQUIS) 5 MG TABS tablet Take 1 tablet (5 mg total) by mouth 2 (two) times daily.    carvedilol (COREG) 3.125 MG tablet Take 1 tablet (3.125 mg total) by mouth 2 (two) times daily with a meal.    cholecalciferol (VITAMIN D3) 25 MCG (1000 UNIT) tablet Take 1,000 Units by mouth daily.    co-enzyme Q-10 50 MG capsule Take 50 mg by mouth daily.    cyclobenzaprine (FLEXERIL) 5 MG tablet Take 2 tablets (10 mg total) by mouth 2 (two) times daily as needed for up to 20 doses for muscle spasms. (Patient not taking: Reported on 05/14/2021) 05/14/2021: No longer takes   ferrous sulfate 325 (65 FE) MG EC tablet TAKE 1 TABLET(325 MG) BY MOUTH DAILY WITH BREAKFAST    glucosamine-chondroitin 500-400 MG tablet Take 1 tablet by mouth 3 (three) times daily.    insulin glargine (LANTUS) 100 unit/mL SOPN Inject 15 Units into the skin daily.    Insulin Pen Needle  31G X 5 MM MISC 1 each by Does not apply route daily. Please use a new pen needle daily with Lantus injection    levothyroxine (SYNTHROID) 100 MCG tablet TAKE 1 TABLET(100 MCG) BY MOUTH DAILY    midodrine (PROAMATINE) 2.5 MG tablet Take 1 tablet (2.5 mg total) by mouth 3 (three) times daily with meals.    Multiple Vitamin (MULTIVITAMIN) tablet Take 1 tablet by mouth daily.    Red Yeast Rice 600 MG CAPS Take by mouth daily at 6  (six) AM. 40/98/1191: Duplicate   Red Yeast Rice Extract (RED YEAST RICE PO) Take 1 capsule by mouth in the morning and at bedtime.    rosuvastatin (CRESTOR) 20 MG tablet Take 1 tablet (20 mg total) by mouth at bedtime.    tamsulosin (FLOMAX) 0.4 MG CAPS capsule Take 0.4 mg by mouth daily after supper.    No facility-administered encounter medications on file as of 08/06/2021.    Patient Active Problem List   Diagnosis Date Noted   Near syncope 03/16/2021   Clotting disorder ([place]) 03/16/2021   Orthostatic hypotension 10/01/2020   TIA (transient ischemic attack) 08/31/2020   Normocytic anemia 08/31/2020   Prolonged QT interval 08/31/2020   Acute CVA (cerebrovascular accident) ([place]) 08/31/2020   Debility 08/21/2020   Lytic bone lesion of femur 08/21/2020   Sick sinus syndrome ([place]) 07/31/2020   Coronary artery disease s/p CABG 2001 07/17/2020   Hypothyroidism 07/17/2020   Venous thromboembolism (VTE) 07/17/2020   Hypertension 07/17/2020   Cardiac pacemaker 07/17/2020   Osteoarthritis 07/17/2020   Type 2 diabetes mellitus with hyperglycemia ([place]) 07/17/2020   Vitamin D deficiency 07/17/2020   Macular degeneration 07/17/2020   Dyslipidemia due to type 2 diabetes mellitus ([place]) 07/17/2020   Insomnia 07/17/2020   BPH (benign prostatic hyperplasia) 07/17/2020   Colon polyps 07/17/2020    Conditions to be addressed/monitored:HTN and DMII  Care Plan : [place] (Adult)  Updates made by Thomas Singleton, RN since 08/06/2021 12:00 AM     Problem: Knowledge deficit of wife caring for patient with diabetes and HTN   Priority: Medium     Long-Range Goal: Wife will work with CCM team to gain knowledge about chronic care conditions.   Start Date: 05/21/2021  Expected End Date: 05/21/2022  Priority: Medium  Note:   Current Barriers:  Knowledge Deficits related to plan of care for management of HTN and DMII  Chronic Disease Management support and education needs  related to HTN and DMII  Spoke with wife per patient request and patient very hard of hearing.  Wife reports patient is doing well.  Denies any pain, shortness of breath or swelling.  Last blood sugar was 145 with recent ranges of 130-140's; BP ranges 120-10/80's.  Does acknowledge patient had fall in  the home without injury last month.  Wife states patient does well with home health therapy and would like for pateint to resume after the new year.   2/9--Wife reports patient up all night and sleeps all day; states increased Remeron dose per provider instruction for the last week with no change in sleep pattern.  Blood sugar  157 after eating this morning with recent ranges of 100-150's.  BP 167/80.  Does complain of knee pain wife plans to discuss with PCP  RNCM Clinical Goal(s):  Patient will verbalize understanding of plan for management of HTN and DMII as evidenced by checking blood pressures and blood sugars verbalize basic understanding of HTN and  DMII disease process and self health management plan as evidenced by maintaining Hgb A1C of below 7.5  through collaboration with RN Care manager, provider, and care team.   Interventions: 1:1 collaboration with primary care provider regarding development and update of comprehensive plan of care as evidenced by provider attestation and co-signature Inter-disciplinary care team collaboration (see longitudinal plan of care) Evaluation of current treatment plan related to  self management and patient's adherence to plan as established by provider  Diabetes:  (Status: Goal on Track (progressing): YES.) Long Term Goal   Lab Results  Component Value Date   HGBA1C 7.3 (A) 07/13/2021  Assessed patient's understanding of A1c goal: <8% Provided education to patient about basic DM disease process; Reviewed medications with patient and discussed importance of medication adherence;        Discussed plans with patient for ongoing care management follow up and  provided patient with direct contact information for care management team;      Provided patient with written educational materials related to hypo and hyperglycemia and importance of correct treatment;       Advised patient, providing education and rationale, to check cbg once daily and when you have symptoms of low or high blood sugar and record        call provider for findings outside established parameters;       Reviewed current glucose ranges and discussed ways to keep them in range Congratulated on A1C reduction Encouraged to discuss pain with PCP  Hypertension: (Status: Goal on track: NO.)  Long Term Goal Last practice recorded BP readings:  BP Readings from Last 3 Encounters:  07/13/21 (!) 172/79  04/28/21 120/70  04/14/21 140/68  Most recent eGFR/CrCl:  Lab Results  Component Value Date   EGFR 65 04/28/2021    No components found for: CRCL  Evaluation of current treatment plan related to hypertension self management and patient's adherence to plan as established by provider;   Provided education to patient re: stroke prevention, s/s of heart attack and stroke; Reviewed prescribed diet low salt carb modified Reviewed medications with patient and discussed importance of compliance;  Advised patient, providing education and rationale, to monitor blood pressure daily and record, calling PCP for findings outside established parameters;  Discussed complications of poorly controlled blood pressure such as heart disease, stroke, circulatory complications, vision complications, kidney impairment, sexual dysfunction;  Encouraged to keep blood pressure log Reviewed BP readings and discussed ways to help reduce, encouraged to contact provider for sustained Discussed sleep pattern, how to change sleep cycle, encouraged to give increased dose of Remeron time to work  Patient Goals/Self-Care Activities: Take medications as prescribed   Attend all scheduled provider appointments Call  provider office for new concerns or questions  check blood sugar at prescribed times: once daily check feet daily for cuts, sores or redness enter blood sugar readings and medication or insulin into daily log take the blood sugar log to all doctor visits check blood pressure 3 times per week write blood pressure results in a log or diary take blood pressure log to all doctor appointments call doctor for signs and symptoms of high blood pressure develop an action plan for high blood pressure keep all doctor appointments take medications for blood pressure exactly as prescribed report new symptoms to your doctor Slowly try to adjust sleep pattern       Plan:The care management team will reach out to the patient again over the next 45 days.  Thomas Azure RN, MSN  RN Care Management Coordinator  [place] [phone] [name]

## 2021-08-06 NOTE — Patient Instructions (Addendum)
Visit Information  Thank you for taking time to visit with me today. Please don't hesitate to contact me if I can be of assistance to you before our next scheduled telephone appointment.  Following are the goals we discussed today:  Take medications as prescribed   Attend all scheduled provider appointments Call provider office for new concerns or questions  check blood sugar at prescribed times: once daily check feet daily for cuts, sores or redness enter blood sugar readings and medication or insulin into daily log take the blood sugar log to all doctor visits check blood pressure 3 times per week write blood pressure results in a log or diary take blood pressure log to all doctor appointments call doctor for signs and symptoms of high blood pressure develop an action plan for high blood pressure keep all doctor appointments take medications for blood pressure exactly as prescribed report new symptoms to your doctor Slowly try to adjust sleep pattern  Our next appointment is by telephone on 3/16 at 1000  Please call the care guide team at [phone] if you need to cancel or reschedule your appointment.   If you are experiencing a Mental Health or Behavioral Health Crisis or need someone to talk to, please call the Suicide and Crisis Lifeline: 988 call the [place] National Suicide Prevention Lifeline: (915)823-6772 or TTY: (716) 036-3402 TTY 201-624-4866) to talk to a trained counselor call 1-800-273-TALK (toll free, 24 hour hotline) go to [facility] [address], [place] [phone]) call 911   The patient verbalized understanding of instructions, educational materials, and care plan provided today and agreed to receive a mailed copy of patient instructions, educational materials, and care plan.   Rhae Lerner RN, MSN RN Care Management Coordinator  [facility] [place] [phone] [email or web address]

## 2021-08-07 NOTE — Progress Notes (Signed)
I have personally reviewed this encounter including the documentation in this note and have collaborated with the care management provider regarding care management and care coordination activities to include development and update of the comprehensive care plan. I am certifying that I agree with the content of this note and encounter as supervising physician.    

## 2021-08-13 ENCOUNTER — Other Ambulatory Visit: Payer: Self-pay | Admitting: Family Medicine

## 2021-08-25 ENCOUNTER — Ambulatory Visit: Payer: Medicare Other | Admitting: Adult Health

## 2021-08-25 ENCOUNTER — Encounter: Payer: Self-pay | Admitting: Adult Health

## 2021-08-25 VITALS — BP 129/61 | HR 71

## 2021-08-25 DIAGNOSIS — I639 Cerebral infarction, unspecified: Secondary | ICD-10-CM

## 2021-08-25 DIAGNOSIS — Z794 Long term (current) use of insulin: Secondary | ICD-10-CM | POA: Diagnosis not present

## 2021-08-25 DIAGNOSIS — E1165 Type 2 diabetes mellitus with hyperglycemia: Secondary | ICD-10-CM

## 2021-08-25 DIAGNOSIS — I10 Essential (primary) hypertension: Secondary | ICD-10-CM

## 2021-08-25 NOTE — Patient Instructions (Addendum)
Continue Eliquis (apixaban) daily  and Crestor for secondary stroke prevention  Continue to follow up with PCP regarding cholesterol, blood pressure and diabetes management  Maintain strict control of hypertension with blood pressure goal between 130-150, diabetes with hemoglobin A1c goal below 7.0 % and cholesterol with LDL cholesterol (bad cholesterol) goal below 70 mg/dL.   Signs of a Stroke? Follow the BEFAST method:  Balance Watch for a sudden loss of balance, trouble with coordination or vertigo Eyes Is there a sudden loss of vision in one or both eyes? Or double vision?  Face: Ask the person to smile. Does one side of the face droop or is it numb?  Arms: Ask the person to raise both arms. Does one arm drift downward? Is there weakness or numbness of a leg? Speech: Ask the person to repeat a simple phrase. Does the speech sound slurred/strange? Is the person confused ? Time: If you observe any of these signs, call 911.       Thank you for coming to see [place] at [facility]. I hope we have been able to provide you high quality care today.  You may receive a patient satisfaction survey over the next few weeks. We would appreciate your feedback and comments so that we may continue to improve ourselves and the health of our patients.

## 2021-08-25 NOTE — Progress Notes (Signed)
[facility] [address] [place]. [place] [address] [phone]       STROKE FOLLOW UP NOTE  [name] Date of Birth:  [birth year] Medical Record Number:  [id number]   Reason for Referral: stroke follow up    SUBJECTIVE:   CHIEF COMPLAINT:  Chief Complaint  Patient presents with   Follow-up    RM 3 with son Thomas [name]  Pt is well and stable, no new concerns      HPI:   Update 08/25/2021 JM: 88 year old male with history of right occipital stroke in 08/2020.  Returns today for follow-up visit accompanied by his son.  Overall stable from stroke standpoint without new or reoccurring stroke/TIA symptoms.  Chronic right-sided deficits from old stroke stable.  Continues to ambulate with rolling walker and w/c for appts and church, no recent falls.  Compliant on Eliquis and Crestor without side effects.  Blood pressure today 129/61.  Son reports episodes of hypotension, will feel generalized weakness and tunnel vision - currently on midodrine. Followed closely by cardiology.  No new concerns at this time      History provided for reference purposes only Update 02/24/2021 JM:  Thomas [name] returns for 9-monthstroke follow-up.  Overall doing well.  Denies new stroke/TIA symptoms. Routinely followed by cardiology who changed warfarin to apixaban and stopped aspirin (due to increased falls) in June.  He has remained on apixaban as well as Crestor without side effects.  Blood pressure today 152/78. BP has been relatively stable on carvedilol but at times will forget to check BP in the AM prior to taking has has caused symptomatic hypotension. Current use of carvedilol only if blood pressure elevated.  Working with PT twice weekly for generalized strengthening and mobility with benefit.  No further concerns at this time.  Initial visit 10/16/2020 JM: Thomas [name] being seen for hospital follow-up accompanied by his son  He has recovered well from stroke standpoint  currently back at baseline.  He returned back home from SNF rehab after 2 weeks.  History of prior stroke with residual right-sided deficits which are currently at baseline.  [facility] and ambulates with rolling walker.  Denies new stroke/TIA symptoms  Remains on aspirin and Coumadin with mild bruising but no bleeding Remains on Crestor without associated side effects Blood pressure today initially elevated and on recheck 132/92 -fluctuation of blood pressure remains his largest concern at today's visit.  This has been a ongoing issue over the past 3 years per son.  He was evaluated in the ED over the weekend due to syncopal episode in setting of hypotension with BP 60s/30s.  He decreased his carvedilol dosage and also remains on Florinef daily - wife tries to check BP daily but per son, will occasionally forget. Per son, wife tried to schedule sooner follow-up visit with cardiology but no sooner visit available -currently scheduled 6/2.  Glucose levels monitored at home which have been elevated - he was recently advised by PCP to check his blood sugars in the morning for accurate fasting levels as he currently monitors in the evening - also recently increase Lantus dosage.  No further concerns at this time  Stroke admission 08/31/2020 [name] a 88y.o. male with history of coagulopathy, CAD, DM, HTN, HLD, coagulopathy on coumadin, chronic strokes with residual right sided deficits, right facial droop.  [facility] with confusion and "vision" problems in setting of low BP.  Personally reviewed hospitalization pertinent progress notes, lab work and imaging with summary provided.  Evaluated by Dr. [name] with stroke work-up revealing right occipital infarct likely secondary to diffuse severe intracranial stenosis in setting of low BP or orthostatic hypotension. Recommended continuation of aspirin and warfarin at discharge.  History of HTN as well as  orthostatic hypotension with long-term BP goal 130-150 to ensure adequate perfusion with initiation of fludrocortisone with recent home readings 80-90s/40-50s.  Chronic anticoagulation on warfarin for over 20 years due to history of DVT and PE - per note review, oncologist would like to change from warfarin to Eliquis but pt has yet to discuss this with his cardiologist. Pt reported difficulty to control INR levels -recommended outpatient follow-up for further discussion with cardiologist.  LDL 37 on Crestor 20 mg daily.  A1c 7.5 on Lantus on [place].  Other stroke risk factors include advanced age, history of stroke 2010 and CAD and MI s/p CABG.  Other active problems include SSS s/p pacemaker and multiple myeloma work-up with oncology.  Evaluated by therapies and recommended discharge to SNF for ongoing therapy needs   Stroke - R occipital infarct, etiology likely secondary to diffuse severe intracranial stenosis in setting of low BP or orthostatic hypotension. However, hypercoagulable or occult afib with fluctuating INR is also possible CTH: early subacute right PCA distribution stroke. CTA head & neck severe intracranial atherosclerosis with widespread and high-grade stenoses. Right ICA bulb 60% stenosis MRI Small to moderate size acute infarction in the inferior right occipital lobe. No hemorrhage. 2D Echo 08/08/20 EF 60-65% LDL 37 HgbA1c 7.5 VTE prophylaxis - coumadin ASA + Coumadin prior to admission, now on ASA 81 and coumadin.  Therapy recommendations:  SNF Disposition:  SNF       ROS:   14 system review of systems performed and negative with exception of those listed in HPI  PMH:  Past Medical History:  Diagnosis Date   Arthritis    Clotting disorder ([place])    Coronary artery disease    Diabetes ([place])    Hyperlipidemia    Hypertension    Hypothyroidism 07/17/2020   Kidney disease    Loss of hearing    Macular degeneration    Myocardial infarction ([place])    Osteoarthritis     Stroke ([place])     PSH:  Past Surgical History:  Procedure Laterality Date   CARPAL TUNNEL RELEASE  2011   CORONARY ANGIOPLASTY WITH STENT PLACEMENT     CORONARY ARTERY BYPASS GRAFT  2001   x6   REPLACEMENT TOTAL KNEE     SPINE SURGERY  2008   cervical and lumbar   THYROIDECTOMY      Social History:  Social History   Socioeconomic History   Marital status: Married    Spouse name: Not on file   Number of children: Not on file   Years of education: Not on file   Highest education level: Not on file  Occupational History   Not on file  Tobacco Use   Smoking status: Former    Types: Pipe   Smokeless tobacco: Never  Vaping Use   Vaping Use: Never used  Substance and Sexual Activity   Alcohol use: Never   Drug use: Never   Sexual activity: Not on file  Other Topics Concern   Not on file  Social History Narrative   Not on file   Social Determinants of Health   Financial Resource Strain: Low Risk    Difficulty of Paying Living  Expenses: Not very hard  Food Insecurity: No Food Insecurity   Worried About Charity fundraiser in the Last Year: Never true   Ran Out of Food in the Last Year: Never true  Transportation Needs: No Transportation Needs   Lack of Transportation (Medical): No   Lack of Transportation (Non-Medical): No  Physical Activity: Not on file  Stress: Not on file  Social Connections: Not on file  Intimate Partner Violence: Not on file    Family History:  Family History  Problem Relation Age of Onset   Stroke Mother    Stroke Father    Heart disease Sister    Cancer Daughter    Hypertension Son    Heart disease Brother    Cancer Brother    Heart disease Brother     Medications:   Current Outpatient Medications on File Prior to Visit  Medication Sig Dispense Refill   acetaminophen (TYLENOL) 325 MG tablet Take 650 mg by mouth every 6 (six) hours as needed for moderate pain or headache.     amiodarone (PACERONE) 200 MG tablet Take 1 tablet  (200 mg total) by mouth daily. 90 tablet 2   apixaban (ELIQUIS) 5 MG TABS tablet Take 1 tablet (5 mg total) by mouth 2 (two) times daily. 180 tablet 3   carvedilol (COREG) 3.125 MG tablet Take 1 tablet (3.125 mg total) by mouth 2 (two) times daily with a meal. 180 tablet 2   cholecalciferol (VITAMIN D3) 25 MCG (1000 UNIT) tablet Take 1,000 Units by mouth daily.     co-enzyme Q-10 50 MG capsule Take 50 mg by mouth daily.     ferrous sulfate 325 (65 FE) MG EC tablet TAKE 1 TABLET(325 MG) BY MOUTH DAILY WITH BREAKFAST 30 tablet 3   glucosamine-chondroitin 500-400 MG tablet Take 1 tablet by mouth 3 (three) times daily.     insulin glargine (LANTUS) 100 unit/mL SOPN Inject 15 Units into the skin daily. 15 mL 3   Insulin Pen Needle 31G X 5 MM MISC 1 each by Does not apply route daily. Please use a new pen needle daily with Lantus injection 100 each 3   levothyroxine (SYNTHROID) 100 MCG tablet TAKE 1 TABLET(100 MCG) BY MOUTH DAILY 90 tablet 0   midodrine (PROAMATINE) 2.5 MG tablet Take 1 tablet (2.5 mg total) by mouth 3 (three) times daily with meals. 270 tablet 2   mirtazapine (REMERON) 7.5 MG tablet TAKE 1 TABLET(7.5 MG) BY MOUTH AT BEDTIME 90 tablet 0   Multiple Vitamin (MULTIVITAMIN) tablet Take 1 tablet by mouth daily.     Red Yeast Rice 600 MG CAPS Take by mouth daily at 6 (six) AM.     Red Yeast Rice Extract (RED YEAST RICE PO) Take 1 capsule by mouth in the morning and at bedtime.     rosuvastatin (CRESTOR) 20 MG tablet Take 1 tablet (20 mg total) by mouth at bedtime. 90 tablet 3   tamsulosin (FLOMAX) 0.4 MG CAPS capsule Take 0.4 mg by mouth daily after supper.     No current facility-administered medications on file prior to visit.    Allergies:   Allergies  Allergen Reactions   Amitriptyline Shortness Of Breath and Palpitations   Lyrica [Pregabalin] Nausea And Vomiting   Pravachol [Pravastatin] Other (See Comments)    Muscle cramps and aches    Effexor Xr [Venlafaxine] Anxiety    Morphine Palpitations   Zolpidem Anxiety and Palpitations      OBJECTIVE:  Physical Exam  Vitals:   08/25/21 1246  BP: 129/61  Pulse: 71   There is no height or weight on file to calculate BMI. No results found.  General: very pleasant elderly Caucasian male, seated, in no evident distress Head: head normocephalic and atraumatic.   Neck: supple with no carotid or supraclavicular bruits Cardiovascular: regular rate and rhythm, no murmurs Musculoskeletal: no deformity Skin:  no rash/petichiae Vascular:  Normal pulses all extremities   Neurologic Exam Mental Status: Awake and fully alert.  Fluent speech and language.  Oriented to place and time. Recent and remote memory intact. Attention span, concentration and fund of knowledge appropriate. Mood and affect appropriate.  Cranial Nerves: Pupils equal, briskly reactive to light. Extraocular movements full without nystagmus. Visual fields difficulty to fully assess with poor vision at baseline.  HOH bilaterally. Facial sensation intact.  Mild right nasolabial fold flattening.  Tongue, palate moves normally and symmetrically.  Motor: Chronic mild right hemiparesis otherwise strength intact  Sensory.:  Intact to light touch, vibratory and pinprick sensation throughout Coordination: Rapid alternating movements normal in all extremities. Finger-to-nose and heel-to-shin performed accurately bilaterally. Gait and Station: Deferred as rolling walker not present Reflexes: 1+ and symmetric. Toes downgoing.        ASSESSMENT: Thomas [name] is a 88 y.o. year old male presented with confusion and " visual problems" in setting of low BP on 08/31/2020 with stroke work-up revealing right occipital infarct likely secondary to diffuse severe intracranial stenosis in setting of low BP or orthostatic hypotension. Vascular risk factors include history of coagulopathy with DVT and PE on chronic warfarin, CAD, HTN with orthostatic hypotension, HLD, DM  and history of chronic strokes with residual right-sided deficits.      PLAN:  Right occipital stroke:  Recovered well without residual deficit.   Continue Eliquis (apixaban) daily  and Crestor for secondary stroke prevention.   Discussed secondary stroke prevention measures and importance of close PCP follow up for aggressive stroke risk factor management  HTN with orthostatic hypotension: BP goal between 130-150 given severe intracranial stenosis and symptomatic hypotension.  Closely followed by cardiology  HLD: LDL goal <70. LDL 37 (08/2020) on Crestor 20 mg daily per PCP.  DMII: A1c goal<7.0. Recent A1c 7.3 on Lantus.  Managed by PCP   Doing well from stroke standpoint and risk factors are managed by PCP. She may follow up PRN, as usual for our patients who are strictly being followed for stroke. If any new neurological issues should arise, request PCP place referral for evaluation by one of our neurologists. Thank you.     CC:  PCP: Thomas Barrack, MD     I spent 34 minutes of face-to-face and non-face-to-face time with patient and son.  This included previsit chart review, lab review, study review, electronic health record documentation, patient and son education regarding prior stroke and etiology, importance of managing stroke risk factors as well as adequate blood pressure control, and answered all other questions to patient and sons satisfaction  Thomas [name], AGNP-BC  [facility] [address] [place], [place] [address]  Phone [phone] Fax [phone] Note: This document was prepared with digital dictation and possible smart phrase technology. Any transcriptional errors that result from this process are unintentional.

## 2021-08-29 ENCOUNTER — Other Ambulatory Visit: Payer: Self-pay | Admitting: Family Medicine

## 2021-08-29 ENCOUNTER — Encounter: Payer: Self-pay | Admitting: Family Medicine

## 2021-08-31 ENCOUNTER — Other Ambulatory Visit: Payer: Self-pay | Admitting: *Deleted

## 2021-08-31 MED ORDER — MIRTAZAPINE 15 MG PO TBDP: 15.0000 mg | ORAL_TABLET | Freq: Every day | ORAL | 0 refills | Status: DC

## 2021-08-31 MED ORDER — TAMSULOSIN HCL 0.4 MG PO CAPS: 0.4000 mg | ORAL_CAPSULE | Freq: Every day | ORAL | 5 refills | Status: DC

## 2021-08-31 NOTE — Telephone Encounter (Signed)
Last refill done by historical provider  

## 2021-08-31 NOTE — Telephone Encounter (Signed)
Rx send to pharmacy  

## 2021-08-31 NOTE — Telephone Encounter (Signed)
Ok to send in new rx for 15mg  nightly. ? ? . Katina Degree, MD ?08/31/2021 11:50 AM  ? ?

## 2021-08-31 NOTE — Telephone Encounter (Signed)
Please see note and advise  

## 2021-09-01 ENCOUNTER — Telehealth: Payer: Self-pay | Admitting: Family Medicine

## 2021-09-01 NOTE — Telephone Encounter (Signed)
[facility] - Dr [place] Merrel would like  to know if patient should come off eliquis before root extraction (not scheduled yet) please call Dr Evelena Peat back at [phone] ?

## 2021-09-01 NOTE — Telephone Encounter (Signed)
Please advise 

## 2021-09-01 NOTE — Telephone Encounter (Signed)
This would probably be fine but he needs to clear with cardiology. ? ?Katina Degree. [name], MD ?09/01/2021 4:10 PM  ? ?

## 2021-09-02 NOTE — Progress Notes (Unsigned)
Chronic Care Management Pharmacy Note  09/07/2021 Name:  Thomas [name] MRN:  [id number] DOB:  1928/10/01  Summary: Initial visit with PharmD.  Patient doing well for age, all chronic conditions well managed.  Meds reviewed and list was current.  Recommendations/Changes made from today's visit: Due for diabetic foot/eye exam  Plan: FU 6 months   Subjective: Thomas [name] is an 88 y.o. year old male who is a primary patient of [name], [name], MD.  The CCM team was consulted for assistance with disease management and care coordination needs.    Engaged with patient by telephone for initial visit in response to provider referral for pharmacy case management and/or care coordination services.   Consent to Services:  The patient was given the following information about Chronic Care Management services today, agreed to services, and gave verbal consent: 1. CCM service includes personalized support from designated clinical staff supervised by the primary care provider, including individualized plan of care and coordination with other care providers 2. 24/7 contact phone numbers for assistance for urgent and routine care needs. 3. Service will only be billed when office clinical staff spend 20 minutes or more in a month to coordinate care. 4. Only one practitioner may furnish and bill the service in a calendar month. 5.The patient may stop CCM services at any time (effective at the end of the month) by phone call to the office staff. 6. The patient will be responsible for cost sharing (co-pay) of up to 20% of the service fee (after annual deductible is met). Patient agreed to services and consent obtained.  Patient Care Team: Vivi Barrack, MD as PCP - General (Family Medicine) Freada Bergeron, MD as PCP - Cardiology (Cardiology) Edythe Clarity, [facility] as Pharmacist (Pharmacist) Leona Singleton, RN as [place] Management  Recent office visits:  10/01/20  Dimas Chyle MD Acute CVA, Increased Lantus Insuline from 13 units to 15 units SubQ Daily. Follow up in 3 months.  10/30/20 Roma Kayser RN, Acute Visit, CUP PACEART REMOTE DEVICE CHECK, no med changes and no follow ups on file. 12/31/20 Dimas Chyle MD, Restarted low dose Coreg. Follow up in 3 months.   Recent consult visits:  10/16/20 Neurology Frann Rider NP, Stroke, No med changes. Follow up in 4 months. 10/23/20  Oncology/Telephone Message, Aura Fey RN. Started Ferrous Sulfate 325 mg daily. No follow ups on file. 11/27/20 Cardiology Gwyndolyn Kaufman, CAD, Stopped Aspirin, Coumadin, Coreg, Florinef Started Eliquis 5 mg 1 tablet by mouth 2 times daily. Follow up 2-3 months. 01/08/21 Cardiology, Gwyndolyn Kaufman MD, Resumed Coreg 12.5 mg BID. Follow up 3-4 months. 02/24/21 Neurology McCue, Janett Billow NP, Stroke,  No med changes. Follow up in 6 months.   Hospital visits:  10/11/20 Lorre Munroe MD Elvina Sidle seen for Syncope. Started on Carvedilol 12.5 mg 2 times a day. No follow ups on file.   Objective:  Lab Results  Component Value Date   CREATININE 1.08 04/28/2021   BUN 18 04/28/2021   GFR 52.56 (L) 08/21/2020   GFRNONAA >60 04/03/2021   GFRAA 66 08/07/2020   NA 138 04/28/2021   K 4.5 04/28/2021   CALCIUM 8.6 04/28/2021   CO2 24 04/28/2021   GLUCOSE 118 (H) 04/28/2021    Lab Results  Component Value Date/Time   HGBA1C 7.3 (A) 07/13/2021 11:35 AM   HGBA1C 7.4 (H) 03/16/2021 09:35 PM   HGBA1C 7.6 (A) 12/31/2020 10:55 AM   HGBA1C 7.5 (H) 08/31/2020 05:23 AM  HGBA1C 7.7 12/03/2019 12:00 AM   GFR 52.56 (L) 08/21/2020 11:43 AM   GFR 49.18 (L) 07/29/2020 10:52 AM    Last diabetic Eye exam: No results found for: HMDIABEYEEXA  Last diabetic Foot exam: No results found for: HMDIABFOOTEX   Lab Results  Component Value Date   CHOL 90 08/31/2020   HDL 31 (L) 08/31/2020   LDLCALC 37 08/31/2020   TRIG 108 08/31/2020   CHOLHDL 2.9 08/31/2020    Hepatic Function  Latest Ref Rng & Units 04/28/2021 03/23/2021 03/22/2021  Total Protein 6.0 - 8.5 g/dL 6.5 - -  Albumin 3.5 - 4.6 g/dL 3.8 2.8(L) 2.7(L)  AST 0 - 40 IU/L 22 - -  ALT 0 - 44 IU/L 22 - -  Alk Phosphatase 44 - 121 IU/L 67 - -  Total Bilirubin 0.0 - 1.2 mg/dL 0.3 - -    Lab Results  Component Value Date/Time   TSH 4.900 (H) 06/11/2021 01:39 PM   TSH 4.530 (H) 04/28/2021 10:45 AM    CBC Latest Ref Rng & Units 04/03/2021 03/23/2021 03/22/2021  WBC 4.0 - 10.5 K/uL 10.3 11.5(H) 9.8  Hemoglobin 13.0 - 17.0 g/dL 11.6(L) 11.6(L) 10.8(L)  Hematocrit 39.0 - 52.0 % 35.4(L) 34.6(L) 32.9(L)  Platelets 150 - 400 K/uL 229 151 137(L)    Lab Results  Component Value Date/Time   VD25OH 46.27 07/17/2020 10:42 AM    Clinical ASCVD: Yes  The ASCVD Risk score (Arnett DK, et al., 2019) failed to calculate for the following reasons:   The 2019 ASCVD risk score is only valid for ages 83 to 45   The patient has a prior MI or stroke diagnosis    Depression screen [facility] 2/9 05/14/2021 10/16/2020 07/17/2020  Decreased Interest 0 0 0  Down, Depressed, Hopeless 0 0 0  PHQ - 2 Score 0 0 0     Social History   Tobacco Use  Smoking Status Former   Types: Pipe  Smokeless Tobacco Never   BP Readings from Last 3 Encounters:  08/25/21 129/61  07/13/21 (!) 172/79  04/28/21 120/70   Pulse Readings from Last 3 Encounters:  08/25/21 71  07/13/21 81  04/28/21 (!) 50   Wt Readings from Last 3 Encounters:  07/13/21 175 lb 6 oz (79.5 kg)  04/28/21 164 lb 9.6 oz (74.7 kg)  04/03/21 160 lb (72.6 kg)   BMI Readings from Last 3 Encounters:  07/13/21 26.67 kg/m  04/28/21 25.03 kg/m  04/14/21 24.33 kg/m    Assessment/Interventions: Review of patient past medical history, allergies, medications, health status, including review of consultants reports, laboratory and other test data, was performed as part of comprehensive evaluation and provision of chronic care management services.   SDOH:  (Social Determinants  of Health) assessments and interventions performed: Yes  Financial Resource Strain: Low Risk    Difficulty of Paying Living Expenses: Not very hard    SDOH Screenings   Alcohol Screen: Not on file  Depression (PHQ2-9): Low Risk    PHQ-2 Score: 0  Financial Resource Strain: Low Risk    Difficulty of Paying Living Expenses: Not very hard  Food Insecurity: No Food Insecurity   Worried About Charity fundraiser in the Last Year: Never true   Ran Out of Food in the Last Year: Never true  Housing: Low Risk    Last Housing Risk Score: 0  Physical Activity: Not on file  Social Connections: Not on file  Stress: Not on file  Tobacco Use: Medium Risk  Smoking Tobacco Use: Former   Smokeless Tobacco Use: Never   Passive Exposure: Not on file  Transportation Needs: No Transportation Needs   Lack of Transportation (Medical): No   Lack of Transportation (Non-Medical): No    CCM Care Plan  Allergies  Allergen Reactions   Amitriptyline Shortness Of Breath and Palpitations   Lyrica [Pregabalin] Nausea And Vomiting   Pravachol [Pravastatin] Other (See Comments)    Muscle cramps and aches    Effexor Xr [Venlafaxine] Anxiety   Morphine Palpitations   Zolpidem Anxiety and Palpitations    Medications Reviewed Today     Reviewed by Frann Rider, NP (Nurse Practitioner) on 08/25/21 at 1323  Med List Status: <None>   Medication Order Taking? Sig Documenting Provider Last Dose Status Informant  acetaminophen (TYLENOL) 325 MG tablet [id number] Yes Take 650 mg by mouth every 6 (six) hours as needed for moderate pain or headache. [provider] Taking Active Spouse/Significant Other  amiodarone (PACERONE) 200 MG tablet [id number] Yes Take 1 tablet (200 mg total) by mouth daily. Loel Dubonnet, NP Taking Active   apixaban (ELIQUIS) 5 MG TABS tablet [id number] Yes Take 1 tablet (5 mg total) by mouth 2 (two) times daily. Freada Bergeron, MD Taking Active Spouse/Significant Other   carvedilol (COREG) 3.125 MG tablet [id number] Yes Take 1 tablet (3.125 mg total) by mouth 2 (two) times daily with a meal. Loel Dubonnet, NP Taking Active   cholecalciferol (VITAMIN D3) 25 MCG (1000 UNIT) tablet [id number] Yes Take 1,000 Units by mouth daily. [provider] Taking Active Spouse/Significant Other  co-enzyme Q-10 50 MG capsule [id number] Yes Take 50 mg by mouth daily. [provider] Taking Active Spouse/Significant Other  ferrous sulfate 325 (65 FE) MG EC tablet [id number] Yes TAKE 1 TABLET(325 MG) BY MOUTH DAILY WITH BREAKFAST Orson Slick, MD Taking Active Spouse/Significant Other  glucosamine-chondroitin 500-400 MG tablet [id number] Yes Take 1 tablet by mouth 3 (three) times daily. [provider] Taking Active Spouse/Significant Other  insulin glargine (LANTUS) 100 unit/mL SOPN [id number] Yes Inject 15 Units into the skin daily. Vivi Barrack, MD Taking Active Spouse/Significant Other  Insulin Pen Needle 31G X 5 MM MISC [id number] Yes 1 each by Does not apply route daily. Please use a new pen needle daily with Lantus injection Vivi Barrack, MD Taking Active Spouse/Significant Other  levothyroxine (SYNTHROID) 100 MCG tablet [id number] Yes TAKE 1 TABLET(100 MCG) BY MOUTH DAILY Vivi Barrack, MD Taking Active   midodrine (PROAMATINE) 2.5 MG tablet [id number] Yes Take 1 tablet (2.5 mg total) by mouth 3 (three) times daily with meals. Loel Dubonnet, NP Taking Active   mirtazapine (REMERON) 7.5 MG tablet [id number] Yes TAKE 1 TABLET(7.5 MG) BY MOUTH AT BEDTIME Vivi Barrack, MD Taking Active            Med Note Shelby Mattocks [facility] D   Thu Aug 06, 2021 11:38 AM) Reports dose increased to 15 mg for the last week   Multiple Vitamin (MULTIVITAMIN) tablet [id number] Yes Take 1 tablet by mouth daily. [provider] Taking Active Spouse/Significant Other  Red Yeast Rice 600 MG CAPS [id number] Yes Take by mouth daily at 6 (six) AM. [provider] Taking Active            Med Note Shelby Mattocks, [facility] D   Thu May 14, 6578  0:38 PM) Duplicate  Red Yeast Rice Extract (RED YEAST RICE PO) [id number] Yes Take 1 capsule by mouth in the  morning and at bedtime. [provider] Taking Active Spouse/Significant Other  rosuvastatin (CRESTOR) 20 MG tablet [id number] Yes Take 1 tablet (20 mg total) by mouth at bedtime. Freada Bergeron, MD Taking Active Spouse/Significant Other  tamsulosin (FLOMAX) 0.4 MG CAPS capsule [id number] Yes Take 0.4 mg by mouth daily after supper. [provider] Taking Active Spouse/Significant Other            Patient Active Problem List   Diagnosis Date Noted   Near syncope 03/16/2021   Clotting disorder ([place]) 03/16/2021   Orthostatic hypotension 10/01/2020   TIA (transient ischemic attack) 08/31/2020   Normocytic anemia 08/31/2020   Prolonged QT interval 08/31/2020   Acute CVA (cerebrovascular accident) ([place]) 08/31/2020   Debility 08/21/2020   Lytic bone lesion of femur 08/21/2020   Sick sinus syndrome ([place]) 07/31/2020   Coronary artery disease s/p CABG 2001 07/17/2020   Hypothyroidism 07/17/2020   Venous thromboembolism (VTE) 07/17/2020   Hypertension 07/17/2020   Cardiac pacemaker 07/17/2020   Osteoarthritis 07/17/2020   Type 2 diabetes mellitus with hyperglycemia ([place]) 07/17/2020   Vitamin D deficiency 07/17/2020   Macular degeneration 07/17/2020   Dyslipidemia due to type 2 diabetes mellitus ([place]) 07/17/2020   Insomnia 07/17/2020   BPH (benign prostatic hyperplasia) 07/17/2020   Colon polyps 07/17/2020    Immunization History  Administered Date(s) Administered   Influenza-Unspecified 03/17/2020   Moderna Sars-Covid-2 Vaccination 09/04/2019, 10/03/2019   Zoster Recombinat (Shingrix) 06/28/2012    Conditions to be addressed/monitored:  CAD, HTN, Hx of CVA, Hypothyroidism, Type II DM, Insomnia  There are no care plans that you recently modified to display for  this patient.     Medication Assistance: None required.  Patient affirms current coverage meets needs.  Compliance/Adherence/Medication fill history: Care Gaps: Foot exam Eye exam  Star-Rating Drugs: Rosuvastatin 67m daily 01/24/21 90ds  [facility] [address] [address]Phone: [phone]: [phone]  Uses pill box? Yes Pt endorses 100% compliance  We discussed: Current pharmacy is preferred with insurance plan and patient is satisfied with pharmacy services Patient decided to: Continue current medication management strategy  Care Plan and Follow Up Patient Decision:  Patient agrees to Care Plan and Follow-up.  Plan: The care management team will reach out to the patient again over the next 180 days.  CBeverly Milch PharmD Clinical Pharmacist ([phone]    Current Barriers:  None at this time  Pharmacist Clinical Goal(s):  Patient will maintain control of glucose and BP as evidenced by home monitoring  through collaboration with PharmD and provider.   Interventions: 1:1 collaboration with PVivi Barrack MD regarding development and update of comprehensive plan of care as evidenced by provider attestation and co-signature Inter-disciplinary care team collaboration (see longitudinal plan of care) Comprehensive medication review performed; medication list updated in electronic medical record  Hypertension/Hx of TIA (BP goal <140/90) -Controlled -Current treatment: Carvedilol 12.552mBID Eliquis 20m21mID -Medications previously tried: ramipril, spironolactone  -Current home readings: 120/70s AM,  -Current dietary habits: good appetite -Current exercise habits: doing physical therapy -Denies hypotensive/hypertensive symptoms -Educated on BP goals and benefits of medications for prevention of heart  attack, stroke and kidney damage; Importance of home blood pressure monitoring; Symptoms of hypotension and importance of maintaining adequate hydration; -Counseled to monitor BP at home daily, document, and provide log at  future appointments -At direction of cardiology, patient is monitoring BP and only taking Coreg if BP elevated to reduce risk of hypotension/dizziness -Recommended to continue current medication  Diabetes (A1c goal <8) -Controlled -Current medications: Lantus 15 units  -Medications previously tried: glimepiride  -Current home glucose readings fasting glucose: 120s post prandial glucose: N/A -Denies hypoglycemic/hyperglycemic symptoms -No glucose readings < 80 -Educated on A1c and blood sugar goals; Prevention and management of hypoglycemic episodes; -Counseled to check feet daily and get yearly eye exams -Due for both foot and eye exam -Recommended to continue current medication Notify providers if have any episodes of hypoglycemia - now more important that elevated glucose as long as it is not severely elevated  Hypothyroidism (Goal: Maintain TSH) -Controlled -Current treatment  Levothyroxine 175mg daily -Medications previously tried: none noted -Takes appropriately, TSH is normal  -Recommended to continue current medication  Insomnia (Goal: Adequate sleep) -Controlled -Current treatment  Mirtazapine 7.568mdaily -Medications previously tried: none -Sleep is reported as good for the most part, also added benefit of appetite stimulation with mirtazapine  -Recommended to continue current medication  Patient Goals/Self-Care Activities Patient will:  - take medications as prescribed check glucose daily, document, and provide at future appointments check blood pressure daily, document, and provide at future appointments  Follow Up Plan: The care management team will reach out to the patient again over the next 180 days.

## 2021-09-02 NOTE — Telephone Encounter (Signed)
Called [facility] back to advise Dr Lavone Neri recommendations to also ok with Cardiology. Office is going to contact [facility] as well.  ?

## 2021-09-06 ENCOUNTER — Other Ambulatory Visit: Payer: Self-pay | Admitting: Family Medicine

## 2021-09-07 ENCOUNTER — Ambulatory Visit (INDEPENDENT_AMBULATORY_CARE_PROVIDER_SITE_OTHER): Payer: Medicare Other | Admitting: Pharmacist

## 2021-09-07 DIAGNOSIS — E1165 Type 2 diabetes mellitus with hyperglycemia: Secondary | ICD-10-CM

## 2021-09-07 DIAGNOSIS — I10 Essential (primary) hypertension: Secondary | ICD-10-CM

## 2021-09-08 NOTE — Patient Instructions (Addendum)
Visit Information ? ? Goals Addressed   ? ?  ?  ?  ?  ? This Visit's Progress  ?  Set My Target A1C-Diabetes Type 2   On track  ?  Timeframe:  Long-Range Goal ?Priority:  High ?Start Date:   03/10/21                          ?Expected End Date: 09/07/21                     ? ?Follow Up Date 06/09/21  ?  ?- set target A1C  ?Maintain A1c < 8.0, with no hypoglycemia  ?  ?Why is this important?   ?Your target A1C is decided together by you and your doctor.  ?It is based on several things like your age and other health issues.  ?  ?Notes:  ?  ? ?  ? ?Patient Care Plan: General Pharmacy (Adult)  ?  ? ?Problem Identified: CAD, HTN, Hx of CVA, Hypothyroidism, Type II DM, Insomnia   ?Priority: High  ?Onset Date: 03/10/2021  ?  ? ?Long-Range Goal: Patient-Specific Goal   ?Start Date: 03/10/2021  ?Expected End Date: 09/07/2021  ?Recent Progress: On track  ?Priority: High  ?Note:   ?Current Barriers:  ?None at this time ? ?Pharmacist Clinical Goal(s):  ?Patient will maintain control of glucose and BP as evidenced by home monitoring  through collaboration with PharmD and provider.  ? ?Interventions: ?1:1 collaboration with Vivi Barrack, MD regarding development and update of comprehensive plan of care as evidenced by provider attestation and co-signature ?Inter-disciplinary care team collaboration (see longitudinal plan of care) ?Comprehensive medication review performed; medication list updated in electronic medical record ? ?Hypertension/Hx of TIA (BP goal <140/90) ?-Controlled ?-Current treatment: ?Carvedilol 12.5mg  BID Appropriate, Effective, Safe, Accessible ?Eliquis 5mg  BID Appropriate, Effective, Safe, Accessible ?-Medications previously tried: ramipril, spironolactone  ?-Current home readings: 120/70s AM,  ?-Current dietary habits: good appetite ?-Current exercise habits: doing physical therapy ?-Denies hypotensive/hypertensive symptoms ?-Educated on BP goals and benefits of medications for prevention of heart attack,  stroke and kidney damage; ?Importance of home blood pressure monitoring; ?Symptoms of hypotension and importance of maintaining adequate hydration; ?-Counseled to monitor BP at home daily, document, and provide log at future appointments ?-At direction of cardiology, patient is monitoring BP and only taking Coreg if BP elevated to reduce risk of hypotension/dizziness ?-Recommended to continue current medication ? ?Update 09/07/21 ?BP controlled at home. ?His wife states he is doing well overall. ?Monitor dose of Eliquis - need to monitor Scr each time to make sure he does not need 2.5mg  BID since he is over 88 years old.  Currently weight and Scr are not at the requirements to lower dose. ?Will continue to monitor. ? ?Diabetes (A1c goal <8) ?-Controlled ?-Current medications: ?Lantus 15 units  Appropriate, Effective, Safe, Accessible ?-Medications previously tried: glimepiride  ?-Current home glucose readings ?fasting glucose: 120s ?post prandial glucose: N/A ?-Denies hypoglycemic/hyperglycemic symptoms ?-No glucose readings < 80 ?-Educated on A1c and blood sugar goals; ?Prevention and management of hypoglycemic episodes; ?-Counseled to check feet daily and get yearly eye exams ?-Due for both foot and eye exam ?-Recommended to continue current medication ?Notify providers if have any episodes of hypoglycemia - now more important that elevated glucose as long as it is not severely elevated ? ?Update 09/07/21 ?A1c continues to be controlled. ?He is not having any hypoglycemia. ?Perfectly acceptable A1c goal < 8  due to age. ?Lantus still affordable - no changes at this time. ? ?Hypothyroidism (Goal: Maintain TSH) ?-Controlled ?-Current treatment  ?Levothyroxine 146mcg daily ?-Medications previously tried: none noted ?-Takes appropriately, TSH is normal  ?-Recommended to continue current medication ? ?Insomnia (Goal: Adequate sleep) ?-Controlled ?-Current treatment  ?Mirtazapine 7.5mg  daily ?-Medications previously  tried: none ?-Sleep is reported as good for the most part, also added benefit of appetite stimulation with mirtazapine  ?-Recommended to continue current medication ? ?Patient Goals/Self-Care Activities ?Patient will:  ?- take medications as prescribed ?check glucose daily, document, and provide at future appointments ?check blood pressure daily, document, and provide at future appointments ? ?Follow Up Plan: The care management team will reach out to the patient again over the next 180 days.  ?  ? ?  ? ?  ? ?The patient verbalized understanding of instructions, educational materials, and care plan provided today and declined offer to receive copy of patient instructions, educational materials, and care plan.  ?Telephone follow up appointment with pharmacy team member scheduled for: 1 year FU ? ?Edythe Clarity, [facility]  ?Beverly Milch, PharmD ?Clinical Pharmacist  ?[place] ?([phone] ? ?

## 2021-09-10 ENCOUNTER — Other Ambulatory Visit: Payer: Self-pay | Admitting: Family Medicine

## 2021-09-10 ENCOUNTER — Ambulatory Visit: Payer: Medicare Other | Admitting: *Deleted

## 2021-09-10 NOTE — Patient Instructions (Addendum)
Visit Information ? ?Thank you for taking time to visit with me today. Please don't hesitate to contact me if I can be of assistance to you before our next scheduled telephone appointment. ? ?Following are the goals we discussed today:  ?Take medications as prescribed   ?Attend all scheduled provider appointments ?Call provider office for new concerns or questions  ?check blood sugar at prescribed times: once daily ?check feet daily for cuts, sores or redness ?enter blood sugar readings and medication or insulin into daily log ?take the blood sugar log to all doctor visits ?check blood pressure 3 times per week ?write blood pressure results in a log or diary ?take blood pressure log to all doctor appointments ?call doctor for signs and symptoms of high blood pressure ?develop an action plan for high blood pressure ?keep all doctor appointments ?take medications for blood pressure exactly as prescribed ?report new symptoms to your doctor ?Slowly try to adjust sleep pattern ? ?Our next appointment is by telephone on 4/27 at 1000 ? ?Please call the care guide team at [phone] if you need to cancel or reschedule your appointment.  ? ?If you are experiencing a Mental Health or Behavioral Health Crisis or need someone to talk to, please call the Suicide and Crisis Lifeline: 988 ?call the [place] National Suicide Prevention Lifeline: 306-331-9114 or TTY: 8430843807 TTY 385-725-1235) to talk to a trained counselor ?call 1-800-273-TALK (toll free, 24 hour hotline) ?go to [facility] [address] [place] [phone]) ?call 911  ? ?Patient verbalizes understanding of instructions and care plan provided today and agrees to view in MyChart. Active MyChart status confirmed with patient.   ? ?Rhae Lerner RN, MSN ?RN Care Management Coordinator  ?[facility] ?[phone] ?[email or web address] ? ?

## 2021-09-10 NOTE — Chronic Care Management (AMB) (Signed)
?Chronic Care Management  ? ?CCM RN Visit Note ? ?09/10/2021 ?Name: Zein Helbing MRN: [id number] DOB: Sep 09, 1928 ? ?Subjective: ?Anthonny Schiller is a 88 y.o. year old male who is a primary care patient of [name], [name], MD. The care management team was consulted for assistance with disease management and care coordination needs.   ? ?Engaged with patient by telephone for follow up visit in response to provider referral for case management and/or care coordination services.  ? ?Consent to Services:  ?The patient was given information about Chronic Care Management services, agreed to services, and gave verbal consent prior to initiation of services.  Please see initial visit note for detailed documentation.  ? ?Patient agreed to services and verbal consent obtained.  ? ?Assessment: Review of patient past medical history, allergies, medications, health status, including review of consultants reports, laboratory and other test data, was performed as part of comprehensive evaluation and provision of chronic care management services.  ? ?SDOH (Social Determinants of Health) assessments and interventions performed:   ? ?CCM Care Plan ? ?Allergies  ?Allergen Reactions  ? Amitriptyline Shortness Of Breath and Palpitations  ? Lyrica [Pregabalin] Nausea And Vomiting  ? Pravachol [Pravastatin] Other (See Comments)  ?  Muscle cramps and aches   ? Effexor Xr [Venlafaxine] Anxiety  ? Morphine Palpitations  ? Zolpidem Anxiety and Palpitations  ? ? ?Outpatient Encounter Medications as of 09/10/2021  ?Medication Sig Note  ? acetaminophen (TYLENOL) 325 MG tablet Take 650 mg by mouth every 6 (six) hours as needed for moderate pain or headache.   ? amiodarone (PACERONE) 200 MG tablet Take 1 tablet (200 mg total) by mouth daily.   ? apixaban (ELIQUIS) 5 MG TABS tablet Take 1 tablet (5 mg total) by mouth 2 (two) times daily.   ? B-D UF III MINI PEN NEEDLES 31G X 5 MM MISC USE 1 DAILY WITH LANTUS PEN INJECTIONS   ? carvedilol (COREG)  3.125 MG tablet Take 1 tablet (3.125 mg total) by mouth 2 (two) times daily with a meal.   ? cholecalciferol (VITAMIN D3) 25 MCG (1000 UNIT) tablet Take 1,000 Units by mouth daily.   ? co-enzyme Q-10 50 MG capsule Take 50 mg by mouth daily.   ? ferrous sulfate 325 (65 FE) MG EC tablet TAKE 1 TABLET(325 MG) BY MOUTH DAILY WITH BREAKFAST   ? glucosamine-chondroitin 500-400 MG tablet Take 1 tablet by mouth 3 (three) times daily.   ? insulin glargine (LANTUS) 100 unit/mL SOPN Inject 15 Units into the skin daily.   ? levothyroxine (SYNTHROID) 100 MCG tablet TAKE 1 TABLET(100 MCG) BY MOUTH DAILY   ? midodrine (PROAMATINE) 2.5 MG tablet Take 1 tablet (2.5 mg total) by mouth 3 (three) times daily with meals.   ? mirtazapine (REMERON SOL-TAB) 15 MG disintegrating tablet DISSOLVE 1 TABLET(15 MG) ON THE TONGUE AT BEDTIME   ? Multiple Vitamin (MULTIVITAMIN) tablet Take 1 tablet by mouth daily.   ? Red Yeast Rice 600 MG CAPS Take by mouth daily at 6 (six) AM. 86/76/7209: Duplicate  ? Red Yeast Rice Extract (RED YEAST RICE PO) Take 1 capsule by mouth in the morning and at bedtime.   ? rosuvastatin (CRESTOR) 20 MG tablet Take 1 tablet (20 mg total) by mouth at bedtime.   ? tamsulosin (FLOMAX) 0.4 MG CAPS capsule Take 1 capsule (0.4 mg total) by mouth daily after supper.   ? ?No facility-administered encounter medications on file as of 09/10/2021.  ? ? ?Patient Active Problem List  ?  Diagnosis Date Noted  ? Near syncope 03/16/2021  ? Clotting disorder ([place]) 03/16/2021  ? Orthostatic hypotension 10/01/2020  ? TIA (transient ischemic attack) 08/31/2020  ? Normocytic anemia 08/31/2020  ? Prolonged QT interval 08/31/2020  ? Acute CVA (cerebrovascular accident) ([place]) 08/31/2020  ? Debility 08/21/2020  ? Lytic bone lesion of femur 08/21/2020  ? Sick sinus syndrome ([place]) 07/31/2020  ? Coronary artery disease s/p CABG 2001 07/17/2020  ? Hypothyroidism 07/17/2020  ? Venous thromboembolism (VTE) 07/17/2020  ? Hypertension 07/17/2020  ?  Cardiac pacemaker 07/17/2020  ? Osteoarthritis 07/17/2020  ? Type 2 diabetes mellitus with hyperglycemia ([place]) 07/17/2020  ? Vitamin D deficiency 07/17/2020  ? Macular degeneration 07/17/2020  ? Dyslipidemia due to type 2 diabetes mellitus ([place]) 07/17/2020  ? Insomnia 07/17/2020  ? BPH (benign prostatic hyperplasia) 07/17/2020  ? Colon polyps 07/17/2020  ? ? ?Conditions to be addressed/monitored:HTN and DMII ? ?Care Plan : [facility]  General Plan of Care (Adult)  ?Updates made by Leona Singleton, RN since 09/10/2021 12:00 AM  ?  ? ?Problem: Knowledge deficit of wife caring for patient with diabetes and HTN   ?Priority: Medium  ?  ? ?Long-Range Goal: Wife will work with CCM team to gain knowledge about chronic care conditions.   ?Start Date: 05/21/2021  ?Expected End Date: 05/21/2022  ?Priority: Medium  ?Note:   ?Current Barriers:  ?Knowledge Deficits related to plan of care for management of HTN and DMII  ?Chronic Disease Management support and education needs related to HTN and DMII  Spoke with wife per patient request and patient very hard of hearing.  Wife reports patient is doing well.  Denies any pain, shortness of breath or swelling.  Last blood sugar was 145 with recent ranges of 130-140's; BP ranges 120-10/80's.  Does acknowledge patient had fall in  the home without injury last month.  Wife states patient does well with home health therapy and would like for pateint to resume after the new year.   ?2/9--Wife reports patient up all night and sleeps all day; states increased Remeron dose per provider instruction for the last week with no change in sleep pattern.  Blood sugar  157 after eating this morning with recent ranges of 100-150's.  BP 167/80.  Does complain of knee pain wife plans to discuss with PCP ?3/16--doing well and sleeping throughout the night with the increase of bedtime medication.  BP in normal range 130/80'S; blood sugars ranging 80-140'S after eating.  Denies any issues or concerns ? ?RNCM  Clinical Goal(s):  ?Patient will verbalize understanding of plan for management of HTN and DMII as evidenced by checking blood pressures and blood sugars ?verbalize basic understanding of HTN and DMII disease process and self health management plan as evidenced by maintaining Hgb A1C of below 7.5  through collaboration with RN Care manager, provider, and care team.  ? ?Interventions: ?1:1 collaboration with primary care provider regarding development and update of comprehensive plan of care as evidenced by provider attestation and co-signature ?Inter-disciplinary care team collaboration (see longitudinal plan of care) ?Evaluation of current treatment plan related to  self management and patient's adherence to plan as established by provider 130/80'S.  DENIES AYG ? ?Diabetes:  (Status: Goal on Track (progressing): YES.) Long Term Goal  ? ?Lab Results  ?Component Value Date  ? HGBA1C 7.3 (A) 07/13/2021  ?Assessed patient's understanding of A1c goal: <8% ?Provided education to patient about basic DM disease process; ?Reviewed medications with patient and discussed importance of medication adherence;        ?  Discussed plans with patient for ongoing care management follow up and provided patient with direct contact information for care management team;      ?Provided patient with written educational materials related to hypo and hyperglycemia and importance of correct treatment;       ?Advised patient, providing education and rationale, to check cbg once daily and when you have symptoms of low or high blood sugar and record        ?call provider for findings outside established parameters;       ?Reviewed current glucose ranges and discussed ways to keep them in range ?Congratulated on A1C reduction ? ?Hypertension: (Status: Goal on Track (progressing): YES.)  Long Term Goal ?Last practice recorded BP readings:  ?BP Readings from Last 3 Encounters:  ?08/25/21 129/61  ?07/13/21 (!) 172/79  ?04/28/21 120/70  ?Most recent  eGFR/CrCl:  ?Lab Results  ?Component Value Date  ? EGFR 65 04/28/2021  ?  No components found for: CRCL ? ?Evaluation of current treatment plan related to hypertension self management and patient's adherence to pl

## 2021-09-13 ENCOUNTER — Emergency Department (HOSPITAL_COMMUNITY): Payer: Medicare Other

## 2021-09-13 ENCOUNTER — Emergency Department (HOSPITAL_COMMUNITY)
Admission: EM | Admit: 2021-09-13 | Discharge: 2021-09-13 | Disposition: A | Discharge status: Home / Self Care | Payer: Medicare Other | Attending: Emergency Medicine | Admitting: Emergency Medicine

## 2021-09-13 ENCOUNTER — Encounter (HOSPITAL_COMMUNITY): Payer: Self-pay | Admitting: Emergency Medicine

## 2021-09-13 ENCOUNTER — Encounter: Payer: Self-pay | Admitting: Family Medicine

## 2021-09-13 ENCOUNTER — Other Ambulatory Visit: Payer: Self-pay

## 2021-09-13 DIAGNOSIS — W19XXXA Unspecified fall, initial encounter: Secondary | ICD-10-CM | POA: Diagnosis not present

## 2021-09-13 DIAGNOSIS — S0081XA Abrasion of other part of head, initial encounter: Secondary | ICD-10-CM | POA: Insufficient documentation

## 2021-09-13 DIAGNOSIS — M25562 Pain in left knee: Secondary | ICD-10-CM | POA: Diagnosis not present

## 2021-09-13 DIAGNOSIS — M25552 Pain in left hip: Secondary | ICD-10-CM | POA: Diagnosis not present

## 2021-09-13 DIAGNOSIS — S0990XA Unspecified injury of head, initial encounter: Secondary | ICD-10-CM | POA: Diagnosis not present

## 2021-09-13 DIAGNOSIS — W06XXXA Fall from bed, initial encounter: Secondary | ICD-10-CM | POA: Insufficient documentation

## 2021-09-13 DIAGNOSIS — Z794 Long term (current) use of insulin: Secondary | ICD-10-CM | POA: Diagnosis not present

## 2021-09-13 DIAGNOSIS — Z043 Encounter for examination and observation following other accident: Secondary | ICD-10-CM | POA: Diagnosis not present

## 2021-09-13 DIAGNOSIS — Z7901 Long term (current) use of anticoagulants: Secondary | ICD-10-CM | POA: Insufficient documentation

## 2021-09-13 DIAGNOSIS — I10 Essential (primary) hypertension: Secondary | ICD-10-CM | POA: Diagnosis not present

## 2021-09-13 DIAGNOSIS — Z79899 Other long term (current) drug therapy: Secondary | ICD-10-CM | POA: Insufficient documentation

## 2021-09-13 DIAGNOSIS — S80919A Unspecified superficial injury of unspecified knee, initial encounter: Secondary | ICD-10-CM | POA: Diagnosis not present

## 2021-09-13 DIAGNOSIS — M2578 Osteophyte, vertebrae: Secondary | ICD-10-CM | POA: Diagnosis not present

## 2021-09-13 MED ORDER — AMLODIPINE BESYLATE 5 MG PO TABS
10.0000 mg | ORAL_TABLET | Freq: Once | ORAL | Status: AC
  Administered 2021-09-13: 10 mg via ORAL
  Filled 2021-09-13: qty 2

## 2021-09-13 MED ORDER — HYDRALAZINE HCL 10 MG PO TABS
10.0000 mg | ORAL_TABLET | ORAL | Status: DC
  Filled 2021-09-13: qty 1

## 2021-09-13 MED ORDER — LIDOCAINE 5 % EX PTCH: 1.0000 | MEDICATED_PATCH | CUTANEOUS | 0 refills | Status: DC

## 2021-09-13 MED ORDER — LIDOCAINE 5 % EX PTCH
3.0000 | MEDICATED_PATCH | CUTANEOUS | Status: DC
  Administered 2021-09-13: 2 via TRANSDERMAL
  Filled 2021-09-13: qty 3

## 2021-09-13 MED ORDER — ACETAMINOPHEN 500 MG PO TABS
1000.0000 mg | ORAL_TABLET | Freq: Once | ORAL | Status: AC
  Administered 2021-09-13: 1000 mg via ORAL
  Filled 2021-09-13: qty 2

## 2021-09-13 NOTE — ED Notes (Signed)
This RN attempted IVx2 - per [place] pt does not need line or labs ?

## 2021-09-13 NOTE — ED Provider Notes (Signed)
?[facility] EMERGENCY DEPARTMENT ?Provider Note ? ? ?CSN: [id number] ?Arrival date & time: 09/13/21  0347 ? ?  ? ?History ? ?Chief Complaint  ?Patient presents with  ? Level 2 fall on thinner   ? ? ?Thomas [name] is a 88 y.o. male. ? ?The history is provided by the patient and the EMS personnel.  ?Fall ?This is a new problem. The current episode started less than 1 hour ago. The problem occurs constantly. The problem has not changed since onset.Pertinent negatives include no chest pain, no abdominal pain, no headaches and no shortness of breath. Nothing aggravates the symptoms. Nothing relieves the symptoms. He has tried nothing for the symptoms. The treatment provided no relief.  ?Witnessed mechanical fall from bed on thinners.   ?  ? ?Home Medications ?Prior to Admission medications   ?Medication Sig Start Date End Date Taking? Authorizing Provider  ?acetaminophen (TYLENOL) 325 MG tablet Take 650 mg by mouth every 6 (six) hours as needed for moderate pain or headache.   Yes [provider]  ?amiodarone (PACERONE) 200 MG tablet Take 1 tablet (200 mg total) by mouth daily. 04/14/21  Yes Alver Sorrow, NP  ?apixaban (ELIQUIS) 5 MG TABS tablet Take 1 tablet (5 mg total) by mouth 2 (two) times daily. 11/27/20  Yes Meriam Sprague, MD  ?B-D UF III MINI PEN NEEDLES 31G X 5 MM MISC USE 1 DAILY WITH LANTUS PEN INJECTIONS 09/07/21  Yes Ardith Dark, MD  ?carvedilol (COREG) 3.125 MG tablet Take 1 tablet (3.125 mg total) by mouth 2 (two) times daily with a meal. 04/14/21  Yes Alver Sorrow, NP  ?cholecalciferol (VITAMIN D3) 25 MCG (1000 UNIT) tablet Take 1,000 Units by mouth daily.   Yes [provider]  ?co-enzyme Q-10 50 MG capsule Take 50 mg by mouth daily.   Yes [provider]  ?ferrous sulfate 325 (65 FE) MG EC tablet TAKE 1 TABLET(325 MG) BY MOUTH DAILY WITH BREAKFAST ?Patient taking differently: Take 325 mg by mouth daily with breakfast. 03/13/21  Yes Jaci Standard, MD  ?glucosamine-chondroitin 500-400 MG tablet Take 1 tablet by mouth 3 (three) times daily.   Yes [provider]  ?insulin glargine (LANTUS) 100 unit/mL SOPN Inject 15 Units into the skin daily. 10/01/20  Yes Ardith Dark, MD  ?levothyroxine (SYNTHROID) 100 MCG tablet TAKE 1 TABLET(100 MCG) BY MOUTH DAILY ?Patient taking differently: Take 100 mcg by mouth daily before breakfast. 08/13/21  Yes Ardith Dark, MD  ?midodrine (PROAMATINE) 2.5 MG tablet Take 1 tablet (2.5 mg total) by mouth 3 (three) times daily with meals. 04/14/21  Yes Alver Sorrow, NP  ?mirtazapine (REMERON SOL-TAB) 15 MG disintegrating tablet DISSOLVE 1 TABLET(15 MG) ON THE TONGUE AT BEDTIME ?Patient taking differently: Take 15 mg by mouth at bedtime. 09/10/21  Yes Ardith Dark, MD  ?Multiple Vitamin (MULTIVITAMIN) tablet Take 1 tablet by mouth daily.   Yes [provider]  ?Red Yeast Rice Extract (RED YEAST RICE PO) Take 1 capsule by mouth in the morning and at bedtime.   Yes [provider]  ?rosuvastatin (CRESTOR) 20 MG tablet Take 1 tablet (20 mg total) by mouth at bedtime. 10/20/20  Yes Meriam Sprague, MD  ?tamsulosin (FLOMAX) 0.4 MG CAPS capsule Take 1 capsule (0.4 mg total) by mouth daily after supper. 08/31/21  Yes Ardith Dark, MD  ?   ? ?Allergies    ?Amitriptyline, Lyrica [pregabalin], Pravachol [pravastatin], Effexor xr [  venlafaxine], Morphine, and Zolpidem   ? ?Review of Systems   ?Review of Systems  ?Constitutional:  Negative for fever.  ?HENT:  Negative for facial swelling.   ?Eyes:  Negative for photophobia.  ?Respiratory:  Negative for shortness of breath.   ?Cardiovascular:  Negative for chest pain.  ?Gastrointestinal:  Negative for abdominal pain.  ?Genitourinary:  Negative for dysuria.  ?Neurological:  Negative for headaches.  ?All other systems reviewed and are negative. ? ?Physical Exam ?Updated Vital Signs ?BP (!) 156/66   Pulse 66   Temp (!) 97.5 ?F (36.4 ?C) (Oral)    Resp 20   Ht 5\' 8"  (1.727 m)   Wt 72.6 kg   SpO2 98%   BMI 24.33 kg/m?  ?Physical Exam ?Vitals and nursing note reviewed.  ?Constitutional:   ?   Appearance: Normal appearance. He is not ill-appearing.  ?HENT:  ?   Head: Normocephalic.  ? ?   Nose: Nose normal.  ?   Mouth/Throat:  ?   Mouth: Mucous membranes are moist.  ?   Pharynx: Oropharynx is clear.  ?Eyes:  ?   Conjunctiva/sclera: Conjunctivae normal.  ?   Pupils: Pupils are equal, round, and reactive to light.  ?Cardiovascular:  ?   Rate and Rhythm: Normal rate and regular rhythm.  ?   Pulses: Normal pulses.  ?   Heart sounds: Normal heart sounds.  ?Pulmonary:  ?   Effort: Pulmonary effort is normal.  ?   Breath sounds: Normal breath sounds.  ?Abdominal:  ?   General: Abdomen is flat. Bowel sounds are normal.  ?   Tenderness: There is no abdominal tenderness. There is no guarding.  ?Musculoskeletal:     ?   General: Normal range of motion.  ?   Right wrist: Normal. No bony tenderness or snuff box tenderness.  ?   Left wrist: Normal. No bony tenderness or snuff box tenderness.  ?   Right hand: Normal.  ?   Left hand: Normal.  ?   Cervical back: Normal range of motion and neck supple. No tenderness.  ?   Right knee: Normal.  ?   Left knee: Normal.  ?   Right ankle: Normal.  ?   Right Achilles Tendon: Normal.  ?   Left ankle: Normal.  ?   Left Achilles Tendon: Normal.  ?Skin: ?   General: Skin is warm and dry.  ?   Capillary Refill: Capillary refill takes less than 2 seconds.  ?Neurological:  ?   General: No focal deficit present.  ?   Mental Status: He is alert and oriented to person, place, and time.  ?   Deep Tendon Reflexes: Reflexes normal.  ?Psychiatric:     ?   Mood and Affect: Mood normal.     ?   Behavior: Behavior normal.  ? ? ?ED Results / Procedures / Treatments   ?Labs ?(all labs ordered are listed, but only abnormal results are displayed) ?Labs Reviewed - No data to display ? ?EKG ?None ? ?Radiology ?CT Head Wo Contrast ? ?Result Date:  09/13/2021 ?CLINICAL DATA:  Patient trauma, penetrating.  Fall. EXAM: CT HEAD WITHOUT CONTRAST CT CERVICAL SPINE WITHOUT CONTRAST TECHNIQUE: Multidetector CT imaging of the head and cervical spine was performed following the standard protocol without intravenous contrast. Multiplanar CT image reconstructions of the cervical spine were also generated. RADIATION DOSE REDUCTION: This exam was performed according to the departmental dose-optimization program which includes automated exposure control, adjustment of the mA and/or  kV according to patient size and/or use of iterative reconstruction technique. COMPARISON:  10/11/2020, 08/31/2020 FINDINGS: CT HEAD FINDINGS Brain: No acute intracranial hemorrhage, midline shift or mass effect. No extra-axial fluid collection. Diffuse atrophy is noted. Subcortical and periventricular white matter hypodensities are present bilaterally. No hydrocephalus. An old infarct is noted in the occipital lobe on the right. Old lacunar infarcts are noted in the basal ganglia bilaterally. Vascular: Atherosclerotic calcification of the vertebral arteries and carotid siphons. No hyperdense vessel. Skull: Normal. Negative for fracture or focal lesion. Sinuses/Orbits: Mild mucosal thickening in the ethmoid air cells. Other: None. CT CERVICAL SPINE FINDINGS Alignment: There is mild anterolisthesis at C5-C6 and C7-T1. Kyphosis of the upper thoracic spine is noted. Skull base and vertebrae: No acute fracture. Anterior spinal fusion hardware is noted at C3-C4 with intervertebral disc spacer. No evidence of hardware loosening. Soft tissues and spinal canal: No prevertebral fluid or swelling. No visible canal hematoma. Disc levels: Multilevel intervertebral disc space narrowing, uncovertebral osteophyte formation, and facet arthropathy is noted bilaterally. Upper chest: Carotid artery calcifications are noted. Other: None. IMPRESSION: 1. No acute intracranial process. 2. Atrophy with chronic  microvascular ischemic changes and old infarcts. 3. Degenerative changes in the cervical spine without evidence of acute fracture. 4. Anterior cervical spinal fusion hardware from C3-C4 without evidence of hardware loosening. El

## 2021-09-13 NOTE — ED Triage Notes (Signed)
Pt BIB EMS from home. Around 24 pt had witnessed mechanical fall from bed. Swelling to left knee and abrasion to left forehead. On eliquis. Hx of TIA and HTN  ?VS with EMS  ?200/120 ?HR 68 ?O295% RA ?Cbg 225 ?

## 2021-09-13 NOTE — ED Notes (Signed)
This RN spoke to wife on phone - given update - son to pick pt up around 8am ?

## 2021-09-13 NOTE — ED Notes (Signed)
Patient Alert and oriented to baseline. Stable and ambulatory to baseline. Patient verbalized understanding of the discharge instructions.  Patient belongings were taken by the patient.   

## 2021-09-13 NOTE — ED Notes (Signed)
Pt to ct with this rn.  

## 2021-09-13 NOTE — Progress Notes (Deleted)
?Cardiology Office Note:   ? ?Date:  09/13/2021  ? ?ID:  Thomas [name], DOB Jun 14, 1929, MRN [id number] ? ?PCP:  Vivi Barrack, MD ?  ?[facility] Providers ?Cardiologist:  Freada Bergeron, MD {   ? ?Referring MD: Vivi Barrack, MD  ? ? ?History of Present Illness:   ? ?Thomas [name] is a 88 y.o. male with a hx of  HLD, DMII, prior CVA, HTN, CAD s/p CABG in 2001, SSS s/p PPM, pulmonary embolism on warfarin, orthostatic hypotension and hypothyroidism who returns to clinic for follow-up. ? ?Patient has a history of MI at age 33 that was managed medically at that time. He had subsequent PCIs and MI in 2001 leading to bypass surgery. He states he has had no chest pain or shortness of breath since his surgery. No stents placed after bypass surgery. Blood pressures fluctuate significantly at home from 90/50s to 200s/100s. Was seen in HTN clinic and where his clonidine was weaned, pindolol was changed to coreg; ramipril and chlorthalidone were continued at current doses. Also saw Dr. Lovena Le with EP to manage his medtronic PPM which was functioning well.  ?  ?TTE showed LVEF 60-65%, moderate basal-septal hypertrophy, G1DD, dilated aorta/aortic root, moderate aortic sclerosis without stenosis, moderate MAC, mild MR. ?  ?Had fall and was seen in the ED in 07/2020. CT head negative for acute pathology. MRI head with remote strokes (known) but no acute pathology.  ? ?Was seen on 10/11/20 for syncope thought to be due to coreg. He was recommended for admission but he did not want to stay. His coreg was decreased.  ? ?Seen in clinic on 11/27/20. Had been off of many of his blood pressure medications due to orthostasis. He was also on floinef at that time. At that time, his blood pressure was relatively stable. Given his risk of falls, his warfarin was changed to apixaban and his aspirin was stopped.  ? ?Admitted 03/16/2021 - 03/23/2021 with near syncope, orthostatic hypotension, bigeminy.  Cardiology was consulted.  On  discharge he was recommended for Coreg 3.125 mg twice daily, midodrine 2.5 3 times daily, amiodarone 200 twice daily for 5 days then 200 mg daily. ? ?Was seen in clinic 04/14/21 by Laurann Montana where his blood pressures were better. No syncope or presyncope.  ?  ?Today, *** ? ? ?Past Medical History:  ?Diagnosis Date  ? Arthritis   ? Clotting disorder ([place])   ? Coronary artery disease   ? Diabetes ([place])   ? Hyperlipidemia   ? Hypertension   ? Hypothyroidism 07/17/2020  ? Kidney disease   ? Loss of hearing   ? Macular degeneration   ? Myocardial infarction [facility])   ? Osteoarthritis   ? Stroke [facility])   ? ? ?Past Surgical History:  ?Procedure Laterality Date  ? CARPAL TUNNEL RELEASE  2011  ? CORONARY ANGIOPLASTY WITH STENT PLACEMENT    ? CORONARY ARTERY BYPASS GRAFT  2001  ? x6  ? REPLACEMENT TOTAL KNEE    ? [place] SURGERY  2008  ? cervical and lumbar  ? THYROIDECTOMY    ? ? ?Current Medications: ?No outpatient medications have been marked as taking for the 09/15/21 encounter (Appointment) with Freada Bergeron, MD.  ?  ? ?Allergies:   Amitriptyline, Lyrica [pregabalin], Pravachol [pravastatin], Effexor xr [venlafaxine], Morphine, and Zolpidem  ? ?Social History  ? ?Socioeconomic History  ? Marital status: Married  ?  Spouse name: Not on file  ? Number of children: Not on file  ?  Years of education: Not on file  ? Highest education level: Not on file  ?Occupational History  ? Not on file  ?Tobacco Use  ? Smoking status: Former  ?  Types: Pipe  ? Smokeless tobacco: Never  ?Vaping Use  ? Vaping Use: Never used  ?Substance and Sexual Activity  ? Alcohol use: Never  ? Drug use: Never  ? Sexual activity: Not on file  ?Other Topics Concern  ? Not on file  ?Social History Narrative  ? Not on file  ? ?Social Determinants of Health  ? ?Financial Resource Strain: Low Risk   ? Difficulty of Paying Living Expenses: Not very hard  ?Food Insecurity: No Food Insecurity  ? Worried About Charity fundraiser in the Last Year: Never  true  ? Ran Out of Food in the Last Year: Never true  ?Transportation Needs: No Transportation Needs  ? Lack of Transportation (Medical): No  ? Lack of Transportation (Non-Medical): No  ?Physical Activity: Not on file  ?Stress: Not on file  ?Social Connections: Not on file  ?  ? ?Family History: ?The patient's family history includes Cancer in his brother and daughter; Heart disease in his brother, brother, and sister; Hypertension in his son; Stroke in his father and mother. ? ?ROS:   ?Review of Systems  ?Constitutional:  Negative for chills, fever and malaise/fatigue.  ?HENT:  Negative for ear pain and hearing loss.   ?Eyes:  Negative for photophobia and pain.  ?Respiratory:  Negative for cough and hemoptysis.   ?Cardiovascular:  Negative for chest pain, palpitations, orthopnea, claudication, leg swelling and PND.  ?Gastrointestinal:  Negative for blood in stool and diarrhea.  ?Genitourinary:  Negative for frequency and hematuria.  ?Musculoskeletal:  Negative for myalgias and neck pain.  ?Neurological:  Negative for dizziness, tingling and weakness.  ?Endo/Heme/Allergies:  Does not bruise/bleed easily.  ?Psychiatric/Behavioral:  Negative for hallucinations. The patient does not have insomnia.   ? ? ?EKGs/Labs/Other Studies Reviewed:   ? ?The following studies were reviewed today: ? ?CT 08/19/20: ?IMPRESSION: ?Atrophy with periventricular small vessel disease. Prior small ?infarcts in each thalamus as well as in the anterior limb of the ?left external capsule. No acute infarct is evident. No mass or ?hemorrhage. ?  ?There are foci of arterial vascular calcification. There are foci of ?paranasal sinus disease. ?  ?ECHO 08/07/2020:  ? ?IMPRESSIONS  ? 1. Left ventricular ejection fraction, by estimation, is 60 to 65%. The  ?left ventricle has normal function. The left ventricle has no regional  ?wall motion abnormalities. There is moderate left ventricular hypertrophy  ?of the basal-septal segment. Left  ?ventricular  diastolic parameters are consistent with Grade I diastolic  ?dysfunction (impaired relaxation). Elevated left ventricular end-diastolic  ?pressure.  ? 2. Right ventricular systolic function is mildly reduced. The right  ?ventricular size is normal.  ? 3. The mitral valve is degenerative. Mild mitral valve regurgitation. No  ?evidence of mitral stenosis. Moderate mitral annular calcification.  ? 4. The aortic valve is normal in structure. There is mild calcification  ?of the aortic valve. There is mild thickening of the aortic valve. Aortic  ?valve regurgitation is not visualized. Mild to moderate aortic valve  ?sclerosis/calcification is present,  ?without any evidence of aortic stenosis.  ? 5. Aortic dilatation noted. There is mild dilatation of the aortic root,  ?measuring 40 mm. There is mild dilatation of the ascending aorta,  ?measuring 43 mm.  ?CT abdomen/pelvis: ?FINDINGS: ?Lower chest: Visualized lung bases are unremarkable.  Moderate size ?sliding-type hiatal hernia is noted. ?  ?Hepatobiliary: No focal liver abnormality is seen. No gallstones, ?gallbladder wall thickening, or biliary dilatation. ?  ?Pancreas: Unremarkable. No pancreatic ductal dilatation or ?surrounding inflammatory changes. ?  ?Spleen: Normal in size without focal abnormality. ?  ?Adrenals/Urinary Tract: Adrenal glands are unremarkable. Kidneys are ?normal, without renal calculi, focal lesion, or hydronephrosis. ?Bladder is unremarkable. ?  ?Stomach/Bowel: There is no evidence of bowel obstruction or ?inflammation. Diverticulosis of descending and sigmoid colon is ?noted without inflammation. The appendix is not visualized. ?  ?Vascular/Lymphatic: Aortic atherosclerosis. No enlarged abdominal or ?pelvic lymph nodes. ?  ?Reproductive: Prostate is unremarkable. ?  ?Other: Small fat containing periumbilical hernia is noted. No ?ascites is noted. ?  ?Musculoskeletal: Multiple small lucencies are noted throughout the ?pelvis and visualized  proximal femurs concerning for possible lytic ?lesions related to multiple myeloma or metastatic disease. ?  ?IMPRESSION: ?1. Moderate size sliding-type hiatal hernia. ?2. Diverticulosis of descending and si

## 2021-09-13 NOTE — Progress Notes (Signed)
Orthopedic Tech Progress Note ?Patient Details:  ?Thomas [name] ?Jan 03, 1929 ?[id number] ? ?Patient ID: Thomas [name], male   DOB: 09/07/1928, 88 y.o.   MRN: [id number] ?I attended trauma page. ?Thomas [name] ?09/13/2021, 5:42 AM ? ?

## 2021-09-14 ENCOUNTER — Telehealth: Payer: Self-pay | Admitting: Family Medicine

## 2021-09-14 ENCOUNTER — Other Ambulatory Visit: Payer: Self-pay

## 2021-09-14 NOTE — Telephone Encounter (Signed)
Ok with me. Please place any necessary orders. 

## 2021-09-14 NOTE — Telephone Encounter (Signed)
Please note there are 2 open encounters-  ? ? ? ? ?Spoke to patients wife and son .- Patient as a lot of mobility issues. Patients son has a pick up truck and it will be really hard to get him in. Patient has fallen 3 times since Sunday 2am per son. ? ?Would Dr [name] be ok doing a virtual apt with the  patient?  ?

## 2021-09-14 NOTE — Telephone Encounter (Signed)
Patient was at ED yesterday  ?

## 2021-09-14 NOTE — Telephone Encounter (Signed)
See message below °

## 2021-09-14 NOTE — Telephone Encounter (Signed)
Is it ok to place referral? 

## 2021-09-14 NOTE — Telephone Encounter (Signed)
Please advise 

## 2021-09-14 NOTE — Telephone Encounter (Signed)
Pt's spouse states she is needing help to take care of her spouse. She would like referral for Home Health Care. Please advise. ?

## 2021-09-14 NOTE — Telephone Encounter (Signed)
Please have them schedule an office visit? ? ?Thomas [name]. [name], MD ?09/14/2021 9:08 AM  ? ?

## 2021-09-14 NOTE — Telephone Encounter (Signed)
Please schedule appointment with Dr Parker ?

## 2021-09-15 ENCOUNTER — Other Ambulatory Visit: Payer: Self-pay

## 2021-09-15 ENCOUNTER — Ambulatory Visit: Payer: Medicare Other | Admitting: Cardiology

## 2021-09-15 DIAGNOSIS — I639 Cerebral infarction, unspecified: Secondary | ICD-10-CM

## 2021-09-15 DIAGNOSIS — M1990 Unspecified osteoarthritis, unspecified site: Secondary | ICD-10-CM

## 2021-09-15 NOTE — Telephone Encounter (Signed)
Virtual visit is fine if that's all they can do but prefer in office so that I can do an exam.  ? ?Thomas [name]. [name], MD ?09/15/2021 7:52 AM  ? ?

## 2021-09-15 NOTE — Telephone Encounter (Signed)
Referral placed and pt wife Jasmine December was advised.  ?

## 2021-09-15 NOTE — Telephone Encounter (Signed)
Spoke w/ pt wife Jasmine December, [place] health order for PT was placed and advised VV if not able to come in the office for visit. Pt wife Jasmine December verbalized understanding ? ?

## 2021-09-15 NOTE — Telephone Encounter (Signed)
Pt's spouse, Jasmine December, returning a call. Asked if you call back, and no answer, please leave a detailed vm. ?

## 2021-09-15 NOTE — Telephone Encounter (Signed)
Called pt and LVM advising Dr [name] recommendations and callback number if they have further questions or concerns.  ?

## 2021-09-16 ENCOUNTER — Telehealth: Payer: Self-pay | Admitting: Cardiology

## 2021-09-16 NOTE — Telephone Encounter (Signed)
? ?  Pre-operative Risk Assessment  ?  ?[name]  ?DOB: [birth year] ?MRN: [id number]  ? ? ? ?Request for Surgical Clearance   ? ?Procedure:  Dental Extraction - Amount of Teeth to be Pulled:  1 ? ?Date of Surgery:  Clearance TBD                              ?   ?Surgeon:  Dr. Mali Merrell ?Surgeon's Group or Practice Name:  [facility] ?Phone number:  [phone] ?Fax number:  [phone] ?  ?Type of Clearance Requested:   ?- Pharmacy:  Hold Apixaban (Eliquis) Divert to Cardiologist   ?  ?Type of Anesthesia:  Local  ?  ?Additional requests/questions:   n/a ? ?Signed, ?Kamira J Martinique   ?09/16/2021, 10:55 AM  ? ?

## 2021-09-17 NOTE — Telephone Encounter (Signed)
? ?  Primary Cardiologist: Meriam Sprague, MD ? ?Chart reviewed as part of pre-operative protocol coverage. Simple dental extractions are considered low risk procedures per guidelines and generally do not require any specific cardiac clearance. It is also generally accepted that for simple extractions and dental cleanings, there is no need to interrupt blood thinner therapy.  ? ?SBE prophylaxis is not required for the patient. ? ?I will route this recommendation to the requesting party via Epic fax function and remove from pre-op pool. ? ?Please call with questions. ? ?Ronney Asters, NP ?09/17/2021, 10:05 AM ? ? ? ? ?

## 2021-09-21 DIAGNOSIS — E785 Hyperlipidemia, unspecified: Secondary | ICD-10-CM | POA: Diagnosis not present

## 2021-09-21 DIAGNOSIS — E039 Hypothyroidism, unspecified: Secondary | ICD-10-CM | POA: Diagnosis not present

## 2021-09-21 DIAGNOSIS — R279 Unspecified lack of coordination: Secondary | ICD-10-CM | POA: Diagnosis not present

## 2021-09-21 DIAGNOSIS — I2510 Atherosclerotic heart disease of native coronary artery without angina pectoris: Secondary | ICD-10-CM | POA: Diagnosis not present

## 2021-09-21 DIAGNOSIS — M1990 Unspecified osteoarthritis, unspecified site: Secondary | ICD-10-CM | POA: Diagnosis not present

## 2021-09-21 DIAGNOSIS — I69398 Other sequelae of cerebral infarction: Secondary | ICD-10-CM | POA: Diagnosis not present

## 2021-09-21 DIAGNOSIS — H3530 Unspecified macular degeneration: Secondary | ICD-10-CM | POA: Diagnosis not present

## 2021-09-21 DIAGNOSIS — D649 Anemia, unspecified: Secondary | ICD-10-CM | POA: Diagnosis not present

## 2021-09-21 DIAGNOSIS — G4700 Insomnia, unspecified: Secondary | ICD-10-CM | POA: Diagnosis not present

## 2021-09-21 DIAGNOSIS — E559 Vitamin D deficiency, unspecified: Secondary | ICD-10-CM | POA: Diagnosis not present

## 2021-09-21 DIAGNOSIS — E1169 Type 2 diabetes mellitus with other specified complication: Secondary | ICD-10-CM | POA: Diagnosis not present

## 2021-09-21 DIAGNOSIS — I495 Sick sinus syndrome: Secondary | ICD-10-CM | POA: Diagnosis not present

## 2021-09-21 DIAGNOSIS — R2681 Unsteadiness on feet: Secondary | ICD-10-CM | POA: Diagnosis not present

## 2021-09-21 DIAGNOSIS — I10 Essential (primary) hypertension: Secondary | ICD-10-CM | POA: Diagnosis not present

## 2021-09-21 DIAGNOSIS — M899 Disorder of bone, unspecified: Secondary | ICD-10-CM | POA: Diagnosis not present

## 2021-09-21 DIAGNOSIS — I951 Orthostatic hypotension: Secondary | ICD-10-CM | POA: Diagnosis not present

## 2021-09-25 DIAGNOSIS — E1165 Type 2 diabetes mellitus with hyperglycemia: Secondary | ICD-10-CM

## 2021-09-25 DIAGNOSIS — Z794 Long term (current) use of insulin: Secondary | ICD-10-CM | POA: Diagnosis not present

## 2021-09-25 DIAGNOSIS — I10 Essential (primary) hypertension: Secondary | ICD-10-CM

## 2021-10-01 DIAGNOSIS — R279 Unspecified lack of coordination: Secondary | ICD-10-CM | POA: Diagnosis not present

## 2021-10-01 DIAGNOSIS — N400 Enlarged prostate without lower urinary tract symptoms: Secondary | ICD-10-CM

## 2021-10-01 DIAGNOSIS — Z950 Presence of cardiac pacemaker: Secondary | ICD-10-CM

## 2021-10-01 DIAGNOSIS — I69398 Other sequelae of cerebral infarction: Secondary | ICD-10-CM | POA: Diagnosis not present

## 2021-10-01 DIAGNOSIS — Z7901 Long term (current) use of anticoagulants: Secondary | ICD-10-CM

## 2021-10-01 DIAGNOSIS — M899 Disorder of bone, unspecified: Secondary | ICD-10-CM

## 2021-10-01 DIAGNOSIS — Z951 Presence of aortocoronary bypass graft: Secondary | ICD-10-CM

## 2021-10-01 DIAGNOSIS — Z86010 Personal history of colonic polyps: Secondary | ICD-10-CM

## 2021-10-01 DIAGNOSIS — R32 Unspecified urinary incontinence: Secondary | ICD-10-CM

## 2021-10-01 DIAGNOSIS — E559 Vitamin D deficiency, unspecified: Secondary | ICD-10-CM

## 2021-10-01 DIAGNOSIS — E039 Hypothyroidism, unspecified: Secondary | ICD-10-CM

## 2021-10-01 DIAGNOSIS — Z9181 History of falling: Secondary | ICD-10-CM

## 2021-10-01 DIAGNOSIS — H3530 Unspecified macular degeneration: Secondary | ICD-10-CM

## 2021-10-01 DIAGNOSIS — M1990 Unspecified osteoarthritis, unspecified site: Secondary | ICD-10-CM | POA: Diagnosis not present

## 2021-10-01 DIAGNOSIS — E1169 Type 2 diabetes mellitus with other specified complication: Secondary | ICD-10-CM

## 2021-10-01 DIAGNOSIS — I495 Sick sinus syndrome: Secondary | ICD-10-CM

## 2021-10-01 DIAGNOSIS — Z794 Long term (current) use of insulin: Secondary | ICD-10-CM

## 2021-10-01 DIAGNOSIS — R2681 Unsteadiness on feet: Secondary | ICD-10-CM | POA: Diagnosis not present

## 2021-10-01 DIAGNOSIS — Z86718 Personal history of other venous thrombosis and embolism: Secondary | ICD-10-CM

## 2021-10-01 DIAGNOSIS — E785 Hyperlipidemia, unspecified: Secondary | ICD-10-CM

## 2021-10-01 DIAGNOSIS — I10 Essential (primary) hypertension: Secondary | ICD-10-CM

## 2021-10-01 DIAGNOSIS — G4700 Insomnia, unspecified: Secondary | ICD-10-CM

## 2021-10-01 DIAGNOSIS — Z96651 Presence of right artificial knee joint: Secondary | ICD-10-CM

## 2021-10-01 DIAGNOSIS — I2510 Atherosclerotic heart disease of native coronary artery without angina pectoris: Secondary | ICD-10-CM

## 2021-10-01 DIAGNOSIS — I951 Orthostatic hypotension: Secondary | ICD-10-CM

## 2021-10-01 DIAGNOSIS — D649 Anemia, unspecified: Secondary | ICD-10-CM

## 2021-10-06 ENCOUNTER — Telehealth: Payer: Self-pay | Admitting: Pharmacist

## 2021-10-06 NOTE — Progress Notes (Signed)
? ? ?Chronic Care Management ?Pharmacy Assistant  ? ?Name: Thomas [name]  MRN: [id number] DOB: 02/06/1929 ? ?Reason for Encounter: General Adherence Call ?  ? ?Recent office visits:  ?None ? ?Recent consult visits:  ?None ? ?Hospital visits:  ?09/13/2021 ED visit for Fall ?-No medication changes indicated. ? ?Medications: ?Outpatient Encounter Medications as of 10/06/2021  ?Medication Sig  ? acetaminophen (TYLENOL) 325 MG tablet Take 650 mg by mouth every 6 (six) hours as needed for moderate pain or headache.  ? amiodarone (PACERONE) 200 MG tablet Take 1 tablet (200 mg total) by mouth daily.  ? apixaban (ELIQUIS) 5 MG TABS tablet Take 1 tablet (5 mg total) by mouth 2 (two) times daily.  ? B-D UF III MINI PEN NEEDLES 31G X 5 MM MISC USE 1 DAILY WITH LANTUS PEN INJECTIONS  ? carvedilol (COREG) 3.125 MG tablet Take 1 tablet (3.125 mg total) by mouth 2 (two) times daily with a meal.  ? cholecalciferol (VITAMIN D3) 25 MCG (1000 UNIT) tablet Take 1,000 Units by mouth daily.  ? co-enzyme Q-10 50 MG capsule Take 50 mg by mouth daily.  ? ferrous sulfate 325 (65 FE) MG EC tablet TAKE 1 TABLET(325 MG) BY MOUTH DAILY WITH BREAKFAST (Patient taking differently: Take 325 mg by mouth daily with breakfast.)  ? glucosamine-chondroitin 500-400 MG tablet Take 1 tablet by mouth 3 (three) times daily.  ? insulin glargine (LANTUS) 100 unit/mL SOPN Inject 15 Units into the skin daily.  ? levothyroxine (SYNTHROID) 100 MCG tablet TAKE 1 TABLET(100 MCG) BY MOUTH DAILY (Patient taking differently: Take 100 mcg by mouth daily before breakfast.)  ? lidocaine (LIDODERM) 5 % Place 1 patch onto the skin daily. Remove & Discard patch within 12 hours or as directed by MD  ? midodrine (PROAMATINE) 2.5 MG tablet Take 1 tablet (2.5 mg total) by mouth 3 (three) times daily with meals.  ? mirtazapine (REMERON SOL-TAB) 15 MG disintegrating tablet DISSOLVE 1 TABLET(15 MG) ON THE TONGUE AT BEDTIME (Patient taking differently: Take 15 mg by mouth at  bedtime.)  ? Multiple Vitamin (MULTIVITAMIN) tablet Take 1 tablet by mouth daily.  ? Red Yeast Rice Extract (RED YEAST RICE PO) Take 1 capsule by mouth in the morning and at bedtime.  ? rosuvastatin (CRESTOR) 20 MG tablet Take 1 tablet (20 mg total) by mouth at bedtime.  ? tamsulosin (FLOMAX) 0.4 MG CAPS capsule Take 1 capsule (0.4 mg total) by mouth daily after supper.  ? ?No facility-administered encounter medications on file as of 10/06/2021.  ? ?Patient Questions: ?Have you had any problems recently with your health? ?Patient's wife denies any recent problems with the patients health. ? ?Have you had any problems with your pharmacy? ?Patient's wife denies any problems with his pharmacy. ? ?What issues or side effects are you having with your medications? ?Patient's wife denies the patient having any issues or side effects from any of his medications. ? ?What would you like me to pass along to Thomas [name], CPP for him to help you with?  ?Patient's wife does not have anything for me to pass along at this time. ? ?What can we do to take care of you better? ?Patient's wife does not have any current suggestions. ? ?Care Gaps: ?Medicare Annual Wellness: Overdue ?Hemoglobin A1C: 7.4% on 03/16/2021 ?Colonoscopy: Aged out ? ?Future Appointments  ?Date Time Provider Department Center  ?10/12/2021 11:00 AM Thomas Dark, MD [facility]  ?10/22/2021 10:00 AM [facility]  ?10/29/2021  7:00 AM [facility] DEVICE REMOTES [facility]  ?10/29/2021  9:40 AM Thomas Sprague, MD [facility]  ?01/28/2022  7:00 AM [facility] DEVICE REMOTES [facility]  ?03/22/2022  3:00 PM [facility] PHARMACIST [facility]  ? ?Star Rating Drugs: ?Lantus last filled 08/21/2021 69 DS ?Rosuvastatin 20 mg last filled 07/23/2021 90 DS ? ?Thomas [name], CMA ?Clinical Pharmacist Assistant ?[phone] ?

## 2021-10-10 ENCOUNTER — Emergency Department (HOSPITAL_COMMUNITY): Payer: Medicare Other

## 2021-10-10 ENCOUNTER — Other Ambulatory Visit: Payer: Self-pay

## 2021-10-10 ENCOUNTER — Emergency Department (HOSPITAL_COMMUNITY)
Admission: EM | Admit: 2021-10-10 | Discharge: 2021-10-10 | Disposition: A | Discharge status: Home / Self Care | Payer: Medicare Other | Attending: Emergency Medicine | Admitting: Emergency Medicine

## 2021-10-10 DIAGNOSIS — D649 Anemia, unspecified: Secondary | ICD-10-CM | POA: Diagnosis not present

## 2021-10-10 DIAGNOSIS — S0990XA Unspecified injury of head, initial encounter: Secondary | ICD-10-CM | POA: Diagnosis not present

## 2021-10-10 DIAGNOSIS — Z981 Arthrodesis status: Secondary | ICD-10-CM | POA: Diagnosis not present

## 2021-10-10 DIAGNOSIS — S199XXA Unspecified injury of neck, initial encounter: Secondary | ICD-10-CM | POA: Diagnosis not present

## 2021-10-10 DIAGNOSIS — R531 Weakness: Secondary | ICD-10-CM | POA: Insufficient documentation

## 2021-10-10 DIAGNOSIS — R55 Syncope and collapse: Secondary | ICD-10-CM

## 2021-10-10 DIAGNOSIS — M7732 Calcaneal spur, left foot: Secondary | ICD-10-CM | POA: Diagnosis not present

## 2021-10-10 DIAGNOSIS — Z950 Presence of cardiac pacemaker: Secondary | ICD-10-CM | POA: Diagnosis not present

## 2021-10-10 DIAGNOSIS — R739 Hyperglycemia, unspecified: Secondary | ICD-10-CM | POA: Diagnosis not present

## 2021-10-10 DIAGNOSIS — Z7901 Long term (current) use of anticoagulants: Secondary | ICD-10-CM | POA: Diagnosis not present

## 2021-10-10 DIAGNOSIS — S21109A Unspecified open wound of unspecified front wall of thorax without penetration into thoracic cavity, initial encounter: Secondary | ICD-10-CM | POA: Diagnosis not present

## 2021-10-10 DIAGNOSIS — R0902 Hypoxemia: Secondary | ICD-10-CM | POA: Diagnosis not present

## 2021-10-10 DIAGNOSIS — M19072 Primary osteoarthritis, left ankle and foot: Secondary | ICD-10-CM | POA: Diagnosis not present

## 2021-10-10 DIAGNOSIS — D72829 Elevated white blood cell count, unspecified: Secondary | ICD-10-CM | POA: Diagnosis not present

## 2021-10-10 DIAGNOSIS — R7989 Other specified abnormal findings of blood chemistry: Secondary | ICD-10-CM | POA: Insufficient documentation

## 2021-10-10 DIAGNOSIS — S91302A Unspecified open wound, left foot, initial encounter: Secondary | ICD-10-CM | POA: Diagnosis not present

## 2021-10-10 DIAGNOSIS — R410 Disorientation, unspecified: Secondary | ICD-10-CM | POA: Diagnosis not present

## 2021-10-10 DIAGNOSIS — G9389 Other specified disorders of brain: Secondary | ICD-10-CM | POA: Diagnosis not present

## 2021-10-10 LAB — BASIC METABOLIC PANEL
Anion gap: 9 (ref 5–15)
BUN: 23 mg/dL (ref 8–23)
CO2: 24 mmol/L (ref 22–32)
Calcium: 9.3 mg/dL (ref 8.9–10.3)
Chloride: 102 mmol/L (ref 98–111)
Creatinine, Ser: 1.46 mg/dL — ABNORMAL HIGH (ref 0.61–1.24)
GFR, Estimated: 45 mL/min — ABNORMAL LOW (ref >60)
Glucose, Bld: 201 mg/dL — ABNORMAL HIGH (ref 70–99)
Potassium: 4.4 mmol/L (ref 3.5–5.1)
Sodium: 135 mmol/L (ref 135–145)

## 2021-10-10 LAB — CBC
HCT: 38.9 % — ABNORMAL LOW (ref 39.0–52.0)
Hemoglobin: 12.7 g/dL — ABNORMAL LOW (ref 13.0–17.0)
MCH: 32.9 pg (ref 26.0–34.0)
MCHC: 32.6 g/dL (ref 30.0–36.0)
MCV: 100.8 fL — ABNORMAL HIGH (ref 80.0–100.0)
Platelets: 188 10*3/uL (ref 150–400)
RBC: 3.86 MIL/uL — ABNORMAL LOW (ref 4.22–5.81)
RDW: 13 % (ref 11.5–15.5)
WBC: 12.1 10*3/uL — ABNORMAL HIGH (ref 4.0–10.5)
nRBC: 0 % (ref 0.0–0.2)

## 2021-10-10 LAB — TROPONIN I (HIGH SENSITIVITY): Troponin I (High Sensitivity): 14 ng/L (ref <18)

## 2021-10-10 MED ORDER — LACTATED RINGERS IV BOLUS
500.0000 mL | Freq: Once | INTRAVENOUS | Status: AC
  Administered 2021-10-10: 500 mL via INTRAVENOUS

## 2021-10-10 MED ORDER — SODIUM CHLORIDE 0.9 % IV BOLUS: 500.0000 mL | Freq: Once | INTRAVENOUS | Status: DC

## 2021-10-10 NOTE — ED Notes (Signed)
Wife Maximilian Tallo [phone] would like an update asap ?

## 2021-10-10 NOTE — Discharge Instructions (Addendum)
Please drink plenty of fluids ?Please recheck with your doctor next week to have recheck of labs and discuss CODE STATUS ?Return if you are worse in any time or need reevaluation ?

## 2021-10-10 NOTE — ED Notes (Signed)
DC instructions reviewed with pt. PT verbalized understanding. Pt DC'd ?

## 2021-10-10 NOTE — ED Notes (Signed)
Pt is able to stand. ?

## 2021-10-10 NOTE — ED Provider Notes (Signed)
?[facility] EMERGENCY DEPARTMENT ?Provider Note ? ? ?CSN: [id number] ?Arrival date & time: 10/10/21  1520 ? ?  ? ?History ? ?Chief Complaint  ?Patient presents with  ? Loss of Consciousness  ? ? ?Thomas [name] is a 88 y.o. male. ? ?HPI ?88 year old male presents today after episode of right-sided weakness.  He states that he had to go to the bathroom and had difficulty making it there in time and had a bowel movement.  He got into the shower was in the shower chair.  He states that he felt good on his right side and listed to the right.  He states that his wife called 911.  He states that he did not feel that he needs to be reevaluated for this.  He states he has a history of TIAs.  He states that he does not wish further treatment for this.  Patient denies any headache, head injury, chest pain, dyspnea,, or other acute symptoms.  Patient persuaded to proceed with labs and imaging while waiting for his wife to get here. ?  ? ?Home Medications ?Prior to Admission medications   ?Medication Sig Start Date End Date Taking? Authorizing Provider  ?acetaminophen (TYLENOL) 325 MG tablet Take 650 mg by mouth every 6 (six) hours as needed for moderate pain or headache.    [provider]  ?amiodarone (PACERONE) 200 MG tablet Take 1 tablet (200 mg total) by mouth daily. 04/14/21   Alver Sorrow, NP  ?apixaban (ELIQUIS) 5 MG TABS tablet Take 1 tablet (5 mg total) by mouth 2 (two) times daily. 11/27/20   Meriam Sprague, MD  ?B-D UF III MINI PEN NEEDLES 31G X 5 MM MISC USE 1 DAILY WITH LANTUS PEN INJECTIONS 09/07/21   Ardith Dark, MD  ?carvedilol (COREG) 3.125 MG tablet Take 1 tablet (3.125 mg total) by mouth 2 (two) times daily with a meal. 04/14/21   Alver Sorrow, NP  ?cholecalciferol (VITAMIN D3) 25 MCG (1000 UNIT) tablet Take 1,000 Units by mouth daily.    [provider]  ?co-enzyme Q-10 50 MG capsule Take 50 mg by mouth daily.    [provider]  ?ferrous sulfate  325 (65 FE) MG EC tablet TAKE 1 TABLET(325 MG) BY MOUTH DAILY WITH BREAKFAST ?Patient taking differently: Take 325 mg by mouth daily with breakfast. 03/13/21   Jaci Standard, MD  ?glucosamine-chondroitin 500-400 MG tablet Take 1 tablet by mouth 3 (three) times daily.    [provider]  ?insulin glargine (LANTUS) 100 unit/mL SOPN Inject 15 Units into the skin daily. 10/01/20   Ardith Dark, MD  ?levothyroxine (SYNTHROID) 100 MCG tablet TAKE 1 TABLET(100 MCG) BY MOUTH DAILY ?Patient taking differently: Take 100 mcg by mouth daily before breakfast. 08/13/21   Ardith Dark, MD  ?lidocaine (LIDODERM) 5 % Place 1 patch onto the skin daily. Remove & Discard patch within 12 hours or as directed by MD 09/13/21   Nicanor Alcon, April, MD  ?midodrine (PROAMATINE) 2.5 MG tablet Take 1 tablet (2.5 mg total) by mouth 3 (three) times daily with meals. 04/14/21   Alver Sorrow, NP  ?mirtazapine (REMERON SOL-TAB) 15 MG disintegrating tablet DISSOLVE 1 TABLET(15 MG) ON THE TONGUE AT BEDTIME ?Patient taking differently: Take 15 mg by mouth at bedtime. 09/10/21   Ardith Dark, MD  ?Multiple Vitamin (MULTIVITAMIN) tablet Take 1 tablet by mouth daily.    [provider]  ?Red Yeast Rice Extract (RED YEAST RICE PO)  Take 1 capsule by mouth in the morning and at bedtime.    [provider]  ?rosuvastatin (CRESTOR) 20 MG tablet Take 1 tablet (20 mg total) by mouth at bedtime. 10/20/20   Meriam Sprague, MD  ?tamsulosin (FLOMAX) 0.4 MG CAPS capsule Take 1 capsule (0.4 mg total) by mouth daily after supper. 08/31/21   Ardith Dark, MD  ?   ? ?Allergies    ?Amitriptyline, Lyrica [pregabalin], Pravachol [pravastatin], Effexor xr [venlafaxine], Morphine, and Zolpidem   ? ?Review of Systems   ?Review of Systems  ?All other systems reviewed and are negative. ? ?Physical Exam ?Updated Vital Signs ?BP (!) 184/78   Pulse (!) 58   Temp 97.8 ?F (36.6 ?C) (Oral)   Resp 19   SpO2 99%  ?Physical Exam ?Vitals and  nursing note reviewed.  ?Constitutional:   ?   General: He is not in acute distress. ?   Appearance: Normal appearance. He is obese. He is not ill-appearing.  ?HENT:  ?   Head: Normocephalic.  ?   Right Ear: External ear normal.  ?   Left Ear: External ear normal.  ?   Nose: Nose normal.  ?   Mouth/Throat:  ?   Pharynx: Oropharynx is clear.  ?Eyes:  ?   Extraocular Movements: Extraocular movements intact.  ?   Pupils: Pupils are equal, round, and reactive to light.  ?Cardiovascular:  ?   Rate and Rhythm: Normal rate and regular rhythm.  ?   Pulses: Normal pulses.  ?   Comments: Foreign body palpated chest wall consistent with prior pacemaker ?Pulmonary:  ?   Effort: Pulmonary effort is normal.  ?Abdominal:  ?   General: Abdomen is flat.  ?Musculoskeletal:     ?   General: Normal range of motion.  ?   Cervical back: Normal range of motion.  ?Skin: ?   General: Skin is warm and dry.  ?   Capillary Refill: Capillary refill takes less than 2 seconds.  ?Neurological:  ?   General: No focal deficit present.  ?   Mental Status: He is alert and oriented to person, place, and time.  ?Psychiatric:     ?   Mood and Affect: Mood normal.     ?   Behavior: Behavior normal.  ? ? ?ED Results / Procedures / Treatments   ?Labs ?(all labs ordered are listed, but only abnormal results are displayed) ?Labs Reviewed  ?CBC - Abnormal; Notable for the following components:  ?    Result Value  ? WBC 12.1 (*)   ? RBC 3.86 (*)   ? Hemoglobin 12.7 (*)   ? HCT 38.9 (*)   ? MCV 100.8 (*)   ? All other components within normal limits  ?BASIC METABOLIC PANEL - Abnormal; Notable for the following components:  ? Glucose, Bld 201 (*)   ? Creatinine, Ser 1.46 (*)   ? GFR, Estimated 45 (*)   ? All other components within normal limits  ?URINALYSIS, ROUTINE W REFLEX MICROSCOPIC  ?POC OCCULT BLOOD, ED  ?TROPONIN I (HIGH SENSITIVITY)  ?TROPONIN I (HIGH SENSITIVITY)  ? ? ?EKG ?EKG Interpretation ? ?Date/Time:  Saturday October 10 2021 17:53:49  EDT ?Ventricular Rate:  70 ?PR Interval:  155 ?QRS Duration: 101 ?QT Interval:  383 ?QTC Calculation: 414 ?R Axis:   15 ?Text Interpretation: Atrial-paced rhythm LVH with secondary repolarization abnormality Inferior infarct, age indeterminate Confirmed by Margarita Grizzle [phone]) on 10/10/2021 6:46:35 PM ? ?Radiology ?CT Head Wo  Contrast ? ?Result Date: 10/10/2021 ?CLINICAL DATA:  Loss of consciousness, stroke suspected, neck trauma EXAM: CT HEAD WITHOUT CONTRAST CT CERVICAL SPINE WITHOUT CONTRAST TECHNIQUE: Multidetector CT imaging of the head and cervical spine was performed following the standard protocol without intravenous contrast. Multiplanar CT image reconstructions of the cervical spine were also generated. RADIATION DOSE REDUCTION: This exam was performed according to the departmental dose-optimization program which includes automated exposure control, adjustment of the mA and/or kV according to patient size and/or use of iterative reconstruction technique. COMPARISON:  09/13/2021 FINDINGS: CT HEAD FINDINGS Brain: No evidence of acute infarction, hemorrhage, hydrocephalus, extra-axial collection or mass lesion/mass effect. Extensive periventricular and deep white matter hypodensity. Unchanged encephalomalacia of the right temporal and occipital lobes (series 3, image 20). Vascular: No hyperdense vessel or unexpected calcification. Skull: Normal. Negative for fracture or focal lesion. Sinuses/Orbits: No acute finding. Other: None. CT CERVICAL SPINE FINDINGS Alignment: Normal. Skull base and vertebrae: No acute fracture. No primary bone lesion or focal pathologic process. Soft tissues and spinal canal: No prevertebral fluid or swelling. No visible canal hematoma. Disc levels: Status post anterior cervical discectomy and fusion of C3-C4 with bony incorporation of the disc space. Mild to moderate multilevel disc space height loss and bridging osteophytosis throughout remaining cervical levels, consistent with  DISH. Upper chest: Negative. Other: None. IMPRESSION: 1. No acute intracranial pathology. 2. No fracture or static subluxation of the cervical spine. Electronically Signed   By: Jearld LeschAlex D Bibbey M.D.   On: 10/10/2021 17:05  ?

## 2021-10-10 NOTE — ED Triage Notes (Addendum)
Pt BIB [facility] for sycopal episode in shower.  Pt was on shower chair and leaning against wall. Pt is on a thinner per EMS. No fall involved.  EMS states pt was unresponsive and weak on right side on scene.  All of that resolved once on stretcher.  He was A&O x4 and refused all tx except vitals on truck.  He is also refusing all tx beyond vitals here. He is A&O x4 here.  He states he wants to establish a DNR here and go home. Pt refuses blood as well.  ? ?EMS vitals 134/50, HR 100 (paced) , 98% RA, CBG 266 ? ?

## 2021-10-12 ENCOUNTER — Ambulatory Visit (INDEPENDENT_AMBULATORY_CARE_PROVIDER_SITE_OTHER): Payer: Medicare Other | Admitting: Family Medicine

## 2021-10-12 VITALS — BP 117/77 | HR 73 | Temp 97.4°F | Ht 68.0 in | Wt 160.0 lb

## 2021-10-12 DIAGNOSIS — L89899 Pressure ulcer of other site, unspecified stage: Secondary | ICD-10-CM | POA: Diagnosis not present

## 2021-10-12 DIAGNOSIS — E1165 Type 2 diabetes mellitus with hyperglycemia: Secondary | ICD-10-CM

## 2021-10-12 DIAGNOSIS — Z794 Long term (current) use of insulin: Secondary | ICD-10-CM

## 2021-10-12 DIAGNOSIS — I951 Orthostatic hypotension: Secondary | ICD-10-CM

## 2021-10-12 DIAGNOSIS — E1159 Type 2 diabetes mellitus with other circulatory complications: Secondary | ICD-10-CM

## 2021-10-12 DIAGNOSIS — I152 Hypertension secondary to endocrine disorders: Secondary | ICD-10-CM

## 2021-10-12 DIAGNOSIS — I495 Sick sinus syndrome: Secondary | ICD-10-CM | POA: Diagnosis not present

## 2021-10-12 DIAGNOSIS — R55 Syncope and collapse: Secondary | ICD-10-CM

## 2021-10-12 LAB — POCT GLYCOSYLATED HEMOGLOBIN (HGB A1C): Hemoglobin A1C: 7.7 % — AB (ref 4.0–5.6)

## 2021-10-12 NOTE — Patient Instructions (Addendum)
It was very nice to see you today! ? ?I will refer you to the foot doctor.  ? ?No other medication changes today. ? ?Please keep an eye on your blood pressure and blood sugar. ? ?Let me know if you would like to have another referral for home health for PT or home health aide. ? ?We will see back in 3 months.  Come back sooner if needed.u ? ?Take care, ?Dr [name] ? ?PLEASE NOTE: ? ?If you had any lab tests please let us know if you have not heard back within a few days. You may see your results on mychart before we have a chance to review them but we will give you a call once they are reviewed by [place]. If we ordered any referrals today, please let us know if you have not heard from their office within the next week.  ? ?Please try these tips to maintain a healthy lifestyle: ? ?Eat at least 3 REAL meals and 1-2 snacks per day.  Aim for no more than 5 hours between eating.  If you eat breakfast, please do so within one hour of getting up.  ? ?Each meal should contain half fruits/vegetables, one quarter protein, and one quarter carbs (no bigger than a computer mouse) ? ?Cut down on sweet beverages. This includes juice, soda, and sweet tea.  ? ?Drink at least 1 glass of water with each meal and aim for at least 8 glasses per day ? ?Exercise at least 150 minutes every week.   ?

## 2021-10-12 NOTE — Assessment & Plan Note (Signed)
At goal today on combination of Coreg 3.125 mg twice daily and midodrine 2.5 mg 3 times daily per cardiology. ?

## 2021-10-12 NOTE — Assessment & Plan Note (Signed)
Continue management per cardiology. 

## 2021-10-12 NOTE — Assessment & Plan Note (Signed)
No other symptoms since being discharged from the hospital.  This is a recurrent issue related to his sick sinus syndrome.  We will continue management per cardiology.  ?

## 2021-10-12 NOTE — Assessment & Plan Note (Signed)
A1c stable 7.7.  We discussed home glucose monitoring.  Continue Lantus 15 units daily.  Recheck in 3 months. ?

## 2021-10-12 NOTE — Progress Notes (Signed)
? ?  Thomas [name] is a 88 y.o. male who presents today for an office visit. ? ?Assessment/Plan:  ?New/Acute Problems: ?Pressure Injury ?No red flags.  No signs of infection.  Possibly related to his type 2 diabetes.  Will place referral to podiatry for further management. ? ?Chronic Problems Addressed Today: ?Near syncope ?No other symptoms since being discharged from the hospital.  This is a recurrent issue related to his sick sinus syndrome.  We will continue management per cardiology.  ? ?Orthostatic hypotension ?Overall stable though did have recent episode of near syncope.  We will continue current combination of midodrine and carvedilol per cardiology.  His blood pressure is at goal today. ? ?Sick sinus syndrome ([name]) ?Continue management per cardiology. ? ?Type 2 diabetes mellitus with hyperglycemia ([name]) ?A1c stable 7.7.  We discussed home glucose monitoring.  Continue Lantus 15 units daily.  Recheck in 3 months. ? ?Hypertension associated with diabetes ([name]) ?At goal today on combination of Coreg 3.125 mg twice daily and midodrine 2.5 mg 3 times daily per cardiology. ? ? ?  ?Subjective:  ?HPI: ? ?Patient is here for ED follow-up.  Went to ED on 10/10/2021 with some episode of right-handed weakness.  911 was called and he went to the emergency room.  In the ED he initially declined further evaluation though did agree to having some labs and imaging. All of this was reassuring and he was discharged home. ? ?He has a sore on his left foot.  Located near the heel.  This has been here for several weeks.  Some pain.  No drainage.  No fevers or chills.  Had an x-ray of the emergency room which did not show any signs of infection. ? ?See A/p for status of chronic conditions.  ? ?   ?  ?Objective:  ?Physical Exam: ?BP 117/77 (BP Location: Left Arm)   Pulse 73   Temp (!) 97.4 ?F (36.3 ?C) (Temporal)   Ht 5\' 8"  (1.727 m)   Wt 160 lb (72.6 kg)   SpO2 98%   BMI 24.33 kg/m?   ?Gen: No acute distress, resting  comfortably ?CV: Regular rate and rhythm with no murmurs appreciated ?Pulm: Normal work of breathing, clear to auscultation bilaterally with no crackles, wheezes, or rhonchi ?MSK: Approximately 3 cm pressure injury  with bulla on the left medial heel.  Mild amount of surrounding erythema. ?Neuro: Grossly normal, moves all extremities ?Psych: Normal affect and thought content ? ?   ? ? . Katina Degree, MD ?10/12/2021 11:58 AM  ? ?

## 2021-10-12 NOTE — Assessment & Plan Note (Signed)
Overall stable though did have recent episode of near syncope.  We will continue current combination of midodrine and carvedilol per cardiology.  His blood pressure is at goal today. ?

## 2021-10-13 ENCOUNTER — Encounter: Payer: Self-pay | Admitting: Family Medicine

## 2021-10-13 NOTE — Telephone Encounter (Signed)
I am happy to refer to wound care though I believe he would be better served with podiatry due to the anatomic location of his wound. ?

## 2021-10-13 NOTE — Telephone Encounter (Signed)
Please advise 

## 2021-10-15 ENCOUNTER — Other Ambulatory Visit: Payer: Self-pay | Admitting: *Deleted

## 2021-10-15 DIAGNOSIS — E1165 Type 2 diabetes mellitus with hyperglycemia: Secondary | ICD-10-CM

## 2021-10-15 DIAGNOSIS — Z950 Presence of cardiac pacemaker: Secondary | ICD-10-CM

## 2021-10-15 DIAGNOSIS — I2510 Atherosclerotic heart disease of native coronary artery without angina pectoris: Secondary | ICD-10-CM

## 2021-10-15 DIAGNOSIS — I25119 Atherosclerotic heart disease of native coronary artery with unspecified angina pectoris: Secondary | ICD-10-CM

## 2021-10-15 DIAGNOSIS — E1159 Type 2 diabetes mellitus with other circulatory complications: Secondary | ICD-10-CM

## 2021-10-15 DIAGNOSIS — Z79899 Other long term (current) drug therapy: Secondary | ICD-10-CM

## 2021-10-15 MED ORDER — ROSUVASTATIN CALCIUM 20 MG PO TABS: 20.0000 mg | ORAL_TABLET | Freq: Every day | ORAL | 3 refills | Status: DC

## 2021-10-17 ENCOUNTER — Other Ambulatory Visit (HOSPITAL_BASED_OUTPATIENT_CLINIC_OR_DEPARTMENT_OTHER): Payer: Self-pay | Admitting: Family

## 2021-10-17 DIAGNOSIS — I498 Other specified cardiac arrhythmias: Secondary | ICD-10-CM

## 2021-10-17 DIAGNOSIS — I25118 Atherosclerotic heart disease of native coronary artery with other forms of angina pectoris: Secondary | ICD-10-CM

## 2021-10-19 NOTE — Telephone Encounter (Signed)
Rx(s) sent to pharmacy electronically.  

## 2021-10-21 ENCOUNTER — Telehealth: Payer: Self-pay

## 2021-10-21 DIAGNOSIS — I10 Essential (primary) hypertension: Secondary | ICD-10-CM | POA: Diagnosis not present

## 2021-10-21 DIAGNOSIS — M1990 Unspecified osteoarthritis, unspecified site: Secondary | ICD-10-CM | POA: Diagnosis not present

## 2021-10-21 DIAGNOSIS — R279 Unspecified lack of coordination: Secondary | ICD-10-CM | POA: Diagnosis not present

## 2021-10-21 DIAGNOSIS — I951 Orthostatic hypotension: Secondary | ICD-10-CM | POA: Diagnosis not present

## 2021-10-21 DIAGNOSIS — R2681 Unsteadiness on feet: Secondary | ICD-10-CM | POA: Diagnosis not present

## 2021-10-21 DIAGNOSIS — M899 Disorder of bone, unspecified: Secondary | ICD-10-CM | POA: Diagnosis not present

## 2021-10-21 DIAGNOSIS — E1169 Type 2 diabetes mellitus with other specified complication: Secondary | ICD-10-CM | POA: Diagnosis not present

## 2021-10-21 DIAGNOSIS — I2510 Atherosclerotic heart disease of native coronary artery without angina pectoris: Secondary | ICD-10-CM | POA: Diagnosis not present

## 2021-10-21 DIAGNOSIS — G4700 Insomnia, unspecified: Secondary | ICD-10-CM | POA: Diagnosis not present

## 2021-10-21 DIAGNOSIS — E039 Hypothyroidism, unspecified: Secondary | ICD-10-CM | POA: Diagnosis not present

## 2021-10-21 DIAGNOSIS — D649 Anemia, unspecified: Secondary | ICD-10-CM | POA: Diagnosis not present

## 2021-10-21 DIAGNOSIS — I495 Sick sinus syndrome: Secondary | ICD-10-CM | POA: Diagnosis not present

## 2021-10-21 DIAGNOSIS — E559 Vitamin D deficiency, unspecified: Secondary | ICD-10-CM | POA: Diagnosis not present

## 2021-10-21 DIAGNOSIS — E785 Hyperlipidemia, unspecified: Secondary | ICD-10-CM | POA: Diagnosis not present

## 2021-10-21 DIAGNOSIS — H3530 Unspecified macular degeneration: Secondary | ICD-10-CM | POA: Diagnosis not present

## 2021-10-21 DIAGNOSIS — I69398 Other sequelae of cerebral infarction: Secondary | ICD-10-CM | POA: Diagnosis not present

## 2021-10-21 NOTE — Telephone Encounter (Signed)
FYI  Seen patient today (10/21/21) since start of POC.  Patient was sitting in chair this past Saturday, got dizzy and fell out of chair.  States wife dropped down in front of patient so he would not sustain an injury.

## 2021-10-21 NOTE — Telephone Encounter (Signed)
See note

## 2021-10-21 NOTE — Telephone Encounter (Signed)
Would you like [place] to place an order for patient? ?

## 2021-10-22 ENCOUNTER — Ambulatory Visit (INDEPENDENT_AMBULATORY_CARE_PROVIDER_SITE_OTHER): Payer: Medicare Other | Admitting: *Deleted

## 2021-10-22 ENCOUNTER — Ambulatory Visit: Payer: Medicare Other | Admitting: Podiatry

## 2021-10-22 DIAGNOSIS — I10 Essential (primary) hypertension: Secondary | ICD-10-CM

## 2021-10-22 DIAGNOSIS — E1165 Type 2 diabetes mellitus with hyperglycemia: Secondary | ICD-10-CM

## 2021-10-22 NOTE — Chronic Care Management (AMB) (Signed)
?Chronic Care Management  ? ?CCM RN Visit Note ? ?10/22/2021 ?Name: Thomas [name] MRN: [place]:[id number] DOB: September 16, 1928 ? ?Subjective: ?Thomas [name] is a 88 y.o. year old male who is a primary care patient of [name], [name], MD. The care management team was consulted for assistance with disease management and care coordination needs.   ? ?follow up visit for follow up visit in response to provider referral for case management and/or care coordination services.  ? ?Consent to Services:  ?The patient was given information about Chronic Care Management services, agreed to services, and gave verbal consent prior to initiation of services.  Please see initial visit note for detailed documentation.  ? ?Patient agreed to services and verbal consent obtained.  ? ?Assessment: Review of patient past medical history, allergies, medications, health status, including review of consultants reports, laboratory and other test data, was performed as part of comprehensive evaluation and provision of chronic care management services.  ? ?SDOH (Social Determinants of Health) assessments and interventions performed:   ? ?CCM Care Plan ? ?Allergies  ?Allergen Reactions  ? Amitriptyline Shortness Of Breath and Palpitations  ? Lyrica [Pregabalin] Nausea And Vomiting  ? Pravachol [Pravastatin] Other (See Comments)  ?  Muscle cramps and aches   ? Effexor Xr [Venlafaxine] Anxiety  ? Morphine Palpitations  ? Zolpidem Anxiety and Palpitations  ? ? ?Outpatient Encounter Medications as of 10/22/2021  ?Medication Sig  ? acetaminophen (TYLENOL) 325 MG tablet Take 650 mg by mouth every 6 (six) hours as needed for moderate pain or headache.  ? amiodarone (PACERONE) 200 MG tablet Take 1 tablet (200 mg total) by mouth daily.  ? apixaban (ELIQUIS) 5 MG TABS tablet Take 1 tablet (5 mg total) by mouth 2 (two) times daily.  ? B-D UF III MINI PEN NEEDLES 31G X 5 MM MISC USE 1 DAILY WITH LANTUS PEN INJECTIONS  ? carvedilol (COREG) 3.125 MG tablet TAKE 1  TABLET(3.125 MG) BY MOUTH TWICE DAILY WITH A MEAL  ? cholecalciferol (VITAMIN D3) 25 MCG (1000 UNIT) tablet Take 1,000 Units by mouth daily.  ? co-enzyme Q-10 50 MG capsule Take 50 mg by mouth daily.  ? ferrous sulfate 325 (65 FE) MG EC tablet TAKE 1 TABLET(325 MG) BY MOUTH DAILY WITH BREAKFAST (Patient taking differently: Take 325 mg by mouth daily with breakfast.)  ? glucosamine-chondroitin 500-400 MG tablet Take 1 tablet by mouth 3 (three) times daily.  ? insulin glargine (LANTUS) 100 unit/mL SOPN Inject 15 Units into the skin daily.  ? levothyroxine (SYNTHROID) 100 MCG tablet TAKE 1 TABLET(100 MCG) BY MOUTH DAILY (Patient taking differently: Take 100 mcg by mouth daily before breakfast.)  ? lidocaine (LIDODERM) 5 % Place 1 patch onto the skin daily. Remove & Discard patch within 12 hours or as directed by MD  ? midodrine (PROAMATINE) 2.5 MG tablet Take 1 tablet (2.5 mg total) by mouth 3 (three) times daily with meals.  ? mirtazapine (REMERON SOL-TAB) 15 MG disintegrating tablet DISSOLVE 1 TABLET(15 MG) ON THE TONGUE AT BEDTIME (Patient taking differently: Take 15 mg by mouth at bedtime.)  ? Multiple Vitamin (MULTIVITAMIN) tablet Take 1 tablet by mouth daily.  ? Red Yeast Rice Extract (RED YEAST RICE PO) Take 1 capsule by mouth in the morning and at bedtime.  ? rosuvastatin (CRESTOR) 20 MG tablet Take 1 tablet (20 mg total) by mouth at bedtime.  ? tamsulosin (FLOMAX) 0.4 MG CAPS capsule Take 1 capsule (0.4 mg total) by mouth daily after supper.  ? ?No facility-administered  encounter medications on file as of 10/22/2021.  ? ? ?Patient Active Problem List  ? Diagnosis Date Noted  ? Near syncope 03/16/2021  ? Clotting disorder ([place]) 03/16/2021  ? Orthostatic hypotension 10/01/2020  ? TIA (transient ischemic attack) 08/31/2020  ? Normocytic anemia 08/31/2020  ? Prolonged QT interval 08/31/2020  ? Acute CVA (cerebrovascular accident) ([place]) 08/31/2020  ? Debility 08/21/2020  ? Lytic bone lesion of femur 08/21/2020  ?  Sick sinus syndrome ([place]) 07/31/2020  ? Coronary artery disease s/p CABG 2001 07/17/2020  ? Hypothyroidism 07/17/2020  ? Venous thromboembolism (VTE) 07/17/2020  ? Hypertension associated with diabetes ([place]) 07/17/2020  ? Cardiac pacemaker 07/17/2020  ? Osteoarthritis 07/17/2020  ? Type 2 diabetes mellitus with hyperglycemia ([place]) 07/17/2020  ? Vitamin D deficiency 07/17/2020  ? Macular degeneration 07/17/2020  ? Dyslipidemia due to type 2 diabetes mellitus ([place]) 07/17/2020  ? Insomnia 07/17/2020  ? BPH (benign prostatic hyperplasia) 07/17/2020  ? Colon polyps 07/17/2020  ? ? ?Conditions to be addressed/monitored:HTN and DMII ? ?Care Plan : [facility]  General Plan of Care (Adult)  ?Updates made by Leona Singleton, RN since 10/22/2021 12:00 AM  ?  ? ?Problem: Knowledge deficit of wife caring for patient with diabetes and HTN   ?Priority: Medium  ?  ? ?Long-Range Goal: Wife will work with CCM team to gain knowledge about chronic care conditions.   ?Start Date: 05/21/2021  ?Expected End Date: 05/21/2022  ?Priority: Medium  ?Note:   ?Current Barriers:  ?Knowledge Deficits related to plan of care for management of HTN and DMII  ?Chronic Disease Management support and education needs related to HTN and DMII  Spoke with wife per patient request and patient very hard of hearing.  Wife reports patient is doing well.  Denies any pain, shortness of breath or swelling.  Last blood sugar was 145 with recent ranges of 130-140's; BP ranges 120-10/80's.  Does acknowledge patient had fall in  the home without injury last month.  Wife states patient does well with home health therapy and would like for pateint to resume after the new year.   ?2/9--Wife reports patient up all night and sleeps all day; states increased Remeron dose per provider instruction for the last week with no change in sleep pattern.  Blood sugar  157 after eating this morning with recent ranges of 100-150's.  BP 167/80.  Does complain of knee pain wife plans to  discuss with PCP ?3/16--doing well and sleeping throughout the night with the increase of bedtime medication.  BP in normal range 130/80'S; blood sugars ranging 80-140'S after eating.  Denies any issues or concerns ?4/27--Wife concerned about patients deteriating conditions.  Has wound to heel; going to podiatrist for evaluation.  Wife requesting [facility] Nurse to assist with dressing changes for the wound.  Is receiving HHPT ([place]).  Also requesting [place] bath aide and hospital bed.  Has had multiple falls, last one e fell on the wife; denies injuries.  Yesterday's vitals: 130/87, 95 % RA, HR 74, CBG ranges 130-150's. ? ?RNCM Clinical Goal(s):  ?Patient will verbalize understanding of plan for management of HTN and DMII as evidenced by checking blood pressures and blood sugars ?verbalize basic understanding of HTN and DMII disease process and self health management plan as evidenced by maintaining Hgb A1C of below 7.5  through collaboration with RN Care manager, provider, and care team.  ? ?Interventions: ?1:1 collaboration with primary care provider regarding development and update of comprehensive plan of care as evidenced by  provider attestation and co-signature ?Inter-disciplinary care team collaboration (see longitudinal plan of care) ?Evaluation of current treatment plan related to  self management and patient's adherence to plan as established by provider 130/80'S.  DENIES AYG ? ?Diabetes:  (Status: Goal on Track (progressing): YES.) Long Term Goal  ? ?Lab Results  ?Component Value Date  ? HGBA1C 7.7 (A) 10/12/2021  ?Assessed patient's understanding of A1c goal: <8% ?Provided education to patient about basic DM disease process; ?Reviewed medications with patient and discussed importance of medication adherence;        ?Discussed plans with patient for ongoing care management follow up and provided patient with direct contact information for care management team;      ?Provided patient with written educational  materials related to hypo and hyperglycemia and importance of correct treatment;       ?Advised patient, providing education and rationale, to check cbg once daily and when you have symptoms of low or high blood su

## 2021-10-22 NOTE — Telephone Encounter (Signed)
Ok to place DME order for hospital bed. ? ?Thomas [name]. [name], MD ?10/22/2021 8:28 AM  ? ?

## 2021-10-22 NOTE — Patient Instructions (Addendum)
Visit Information ? ?Thank you for taking time to visit with me today. Please don't hesitate to contact me if I can be of assistance to you before our next scheduled telephone appointment. ? ?Following are the goals we discussed today:  ?Take medications as prescribed   ?Attend all scheduled provider appointments ?Call provider office for new concerns or questions  ?check blood sugar at prescribed times: once daily ?check feet daily for cuts, sores or redness ?enter blood sugar readings and medication or insulin into daily log ?take the blood sugar log to all doctor visits ?check blood pressure 3 times per week ?write blood pressure results in a log or diary ?take blood pressure log to all doctor appointments ?call doctor for signs and symptoms of high blood pressure ?develop an action plan for high blood pressure ?keep all doctor appointments ?take medications for blood pressure exactly as prescribed ?report new symptoms to your doctor ?Slowly try to adjust sleep pattern ? ?Our next appointment is by telephone on 5/16 at 1200 ? ?Please call the care guide team at [phone] if you need to cancel or reschedule your appointment.  ? ?If you are experiencing a Mental Health or Behavioral Health Crisis or need someone to talk to, please call the Suicide and Crisis Lifeline: 988 ?call the [place] National Suicide Prevention Lifeline: 574-632-8457 or TTY: (806)424-5746 TTY 680-426-4986) to talk to a trained counselor ?call 1-800-273-TALK (toll free, 24 hour hotline) ?call 911  ? ?Patient verbalizes understanding of instructions and care plan provided today and agrees to view in MyChart. Active MyChart status confirmed with patient.   ? ? ?Rhae Lerner RN, MSN ?RN Care Management Coordinator  ?[facility] ?[phone] ?[email or web address] ? ?

## 2021-10-23 ENCOUNTER — Encounter: Payer: Self-pay | Admitting: Podiatry

## 2021-10-23 ENCOUNTER — Ambulatory Visit: Payer: Medicare Other | Admitting: Podiatry

## 2021-10-23 DIAGNOSIS — L02619 Cutaneous abscess of unspecified foot: Secondary | ICD-10-CM

## 2021-10-23 DIAGNOSIS — L03119 Cellulitis of unspecified part of limb: Secondary | ICD-10-CM | POA: Diagnosis not present

## 2021-10-23 MED ORDER — DOXYCYCLINE HYCLATE 100 MG PO TABS: 100.0000 mg | ORAL_TABLET | Freq: Two times a day (BID) | ORAL | 1 refills | Status: DC

## 2021-10-23 MED ORDER — OXYCODONE-ACETAMINOPHEN 10-325 MG PO TABS: 1.0000 | ORAL_TABLET | ORAL | 0 refills | Status: DC | PRN

## 2021-10-23 NOTE — Progress Notes (Signed)
Subjective:  ? ?Patient ID: Thomas [name], male   DOB: 88 y.o.   MRN: [id number]  ? ?HPI ?Patient presents with family with patient who is not very lucid and has a breakdown of tissue on the left heel medial side superficial with blister that is having more than anything intense discomfort with also a small area of redness on the left first metatarsal.  Is having trouble with walking or standing or sleeping due to the pain ? ? ?Review of Systems  ?All other systems reviewed and are negative. ? ? ?   ?Objective:  ?Physical Exam ?Vitals and nursing note reviewed.  ?Constitutional:   ?   Appearance: He is well-developed.  ?Pulmonary:  ?   Effort: Pulmonary effort is normal.  ?Musculoskeletal:     ?   General: Normal range of motion.  ?Skin: ?   General: Skin is warm.  ?Neurological:  ?   Mental Status: He is alert.  ?  ?Vascular status found to be significantly diminished both DP and PT pulses with patient noted to have neurological sensation that is intact mildly diminished and is found to have a area of irritation on the left medial heel measuring about 2-1/2 x 2 and half centimeters local with slight erythema around it no active drainage no subcutaneous tissue currently exposed and a small area measuring about 4 x 4 mm on the left first metatarsal medial side also with no breakdown of tissue no drainage ? ?   ?Assessment:  ?Appears to be more friction with possible low-grade abscess but localized with no indication currently of systemic pathology or proximal extension ? ?   ?Plan:  ?H&P reviewed condition and x-ray.  At this time it is more about the discomfort that the patient is in and we are not going to place patient on pain medication but at best I like only to take 1 or 2 a day if possible.  I did advise on careful soaks and I did dispense fleece booties to try to reduce the friction that he is experiencing and I am relatively optimistic that this will heal uneventfully.  I placed on doxycycline twice daily  and Percocet as needed.  Patient be seen back approximately 10 days if any systemic signs of infection were to occur patient is to go straight to the emergency room and ultimately may require amputation ? ?X-rays indicate no indications that any bony issue is associated with the condition ?   ? ? ?

## 2021-10-25 DIAGNOSIS — I10 Essential (primary) hypertension: Secondary | ICD-10-CM

## 2021-10-25 DIAGNOSIS — E1159 Type 2 diabetes mellitus with other circulatory complications: Secondary | ICD-10-CM

## 2021-10-25 DIAGNOSIS — Z794 Long term (current) use of insulin: Secondary | ICD-10-CM

## 2021-10-26 ENCOUNTER — Telehealth: Payer: Self-pay

## 2021-10-26 ENCOUNTER — Other Ambulatory Visit: Payer: Self-pay | Admitting: *Deleted

## 2021-10-26 ENCOUNTER — Encounter: Payer: Self-pay | Admitting: Family Medicine

## 2021-10-26 DIAGNOSIS — M1990 Unspecified osteoarthritis, unspecified site: Secondary | ICD-10-CM

## 2021-10-26 NOTE — Telephone Encounter (Signed)
Patient daughter aware DME for hospital bed faxed today  ?

## 2021-10-26 NOTE — Telephone Encounter (Signed)
His podiatrist prescribed the pain medication so they should reach out to them though it is ok for them to break in half if it is too strong. ? ?Katina Degree. [name], MD ?10/26/2021 3:36 PM  ? ?

## 2021-10-26 NOTE — Telephone Encounter (Signed)
Thomas [name] (PTA) called in regards to patient and high level of pain he is in. Pain medication is working but states they are hesitant to give it to him during the day because putting him in a state of not being able to function well. Pt complains of being in severe pain during the day and may need something milder to take during the day and use current pain meds in the evening. Was seen by Podiatrist on Friday and states wound is stable but thinks pt may need to be seen again before July.  ?

## 2021-10-26 NOTE — Telephone Encounter (Signed)
DME faxed to [phone] 

## 2021-10-26 NOTE — Telephone Encounter (Unsigned)
Thomas [name] called in about this request for a hospital bed.  ? ?She is looking for a call back asap, please.  ?

## 2021-10-27 NOTE — Telephone Encounter (Signed)
Patient advised Dr [name] recommendations and verbalized understanding  ?

## 2021-10-28 ENCOUNTER — Other Ambulatory Visit: Payer: Self-pay | Admitting: Family Medicine

## 2021-10-29 ENCOUNTER — Encounter: Payer: Self-pay | Admitting: Family Medicine

## 2021-10-29 ENCOUNTER — Ambulatory Visit (INDEPENDENT_AMBULATORY_CARE_PROVIDER_SITE_OTHER): Payer: Medicare Other

## 2021-10-29 ENCOUNTER — Ambulatory Visit: Payer: Medicare Other | Admitting: Cardiology

## 2021-10-29 DIAGNOSIS — I495 Sick sinus syndrome: Secondary | ICD-10-CM

## 2021-10-29 LAB — CUP PACEART REMOTE DEVICE CHECK
Battery Remaining Longevity: 130 mo
Battery Voltage: 3.01 V
Brady Statistic AP VP Percent: 0.05 %
Brady Statistic AP VS Percent: 98.46 %
Brady Statistic AS VP Percent: 0 %
Brady Statistic AS VS Percent: 1.49 %
Brady Statistic RA Percent Paced: 98.63 %
Brady Statistic RV Percent Paced: 0.05 %
Date Time Interrogation Session: 20230504025828
Implantable Lead Implant Date: 20081106
Implantable Lead Implant Date: 20210210
Implantable Lead Location: 753859
Implantable Lead Location: 753860
Implantable Lead Model: 4076
Implantable Lead Model: 4076
Implantable Pulse Generator Implant Date: 20210210
Lead Channel Impedance Value: 266 Ohm
Lead Channel Impedance Value: 323 Ohm
Lead Channel Impedance Value: 342 Ohm
Lead Channel Impedance Value: 494 Ohm
Lead Channel Pacing Threshold Amplitude: 0.5 V
Lead Channel Pacing Threshold Amplitude: 1 V
Lead Channel Pacing Threshold Pulse Width: 0.4 ms
Lead Channel Pacing Threshold Pulse Width: 0.4 ms
Lead Channel Sensing Intrinsic Amplitude: 1.375 mV
Lead Channel Sensing Intrinsic Amplitude: 1.375 mV
Lead Channel Sensing Intrinsic Amplitude: 7.125 mV
Lead Channel Sensing Intrinsic Amplitude: 7.125 mV
Lead Channel Setting Pacing Amplitude: 1.5 V
Lead Channel Setting Pacing Amplitude: 2 V
Lead Channel Setting Pacing Pulse Width: 0.4 ms
Lead Channel Setting Sensing Sensitivity: 0.9 mV

## 2021-11-03 DIAGNOSIS — H4710 Unspecified papilledema: Secondary | ICD-10-CM | POA: Diagnosis not present

## 2021-11-03 DIAGNOSIS — H35033 Hypertensive retinopathy, bilateral: Secondary | ICD-10-CM | POA: Diagnosis not present

## 2021-11-03 DIAGNOSIS — H353231 Exudative age-related macular degeneration, bilateral, with active choroidal neovascularization: Secondary | ICD-10-CM | POA: Diagnosis not present

## 2021-11-03 DIAGNOSIS — H43813 Vitreous degeneration, bilateral: Secondary | ICD-10-CM | POA: Diagnosis not present

## 2021-11-06 ENCOUNTER — Encounter: Payer: Self-pay | Admitting: Family Medicine

## 2021-11-06 DIAGNOSIS — H4710 Unspecified papilledema: Secondary | ICD-10-CM | POA: Diagnosis not present

## 2021-11-06 NOTE — Telephone Encounter (Signed)
Called [facility] and they stated they sent over a request on 10/27/21. Pt insurance requesting Bed Narrative. Re-faxing over information needed to send back to be reviewed.  ?

## 2021-11-07 ENCOUNTER — Other Ambulatory Visit: Payer: Self-pay | Admitting: Family Medicine

## 2021-11-10 ENCOUNTER — Ambulatory Visit (INDEPENDENT_AMBULATORY_CARE_PROVIDER_SITE_OTHER): Payer: Medicare Other | Admitting: *Deleted

## 2021-11-10 DIAGNOSIS — E1165 Type 2 diabetes mellitus with hyperglycemia: Secondary | ICD-10-CM

## 2021-11-10 DIAGNOSIS — I10 Essential (primary) hypertension: Secondary | ICD-10-CM

## 2021-11-10 NOTE — Chronic Care Management (AMB) (Signed)
?Chronic Care Management  ? ?CCM RN Visit Note ? ?11/10/2021 ?Name: Thomas [name] MRN: [id number] DOB: Sep 04, 1928 ? ?Subjective: ?Thomas [name] is a 88 y.o. year old male who is a primary care patient of [name], [name], MD. The care management team was consulted for assistance with disease management and care coordination needs.   ? ?Engaged with patient by telephone for follow up visit in response to provider referral for case management and/or care coordination services.  ? ?Consent to Services:  ?The patient was given information about Chronic Care Management services, agreed to services, and gave verbal consent prior to initiation of services.  Please see initial visit note for detailed documentation.  ? ?Patient agreed to services and verbal consent obtained.  ? ?Assessment: Review of patient past medical history, allergies, medications, health status, including review of consultants reports, laboratory and other test data, was performed as part of comprehensive evaluation and provision of chronic care management services.  ? ?SDOH (Social Determinants of Health) assessments and interventions performed:   ? ?CCM Care Plan ? ?Allergies  ?Allergen Reactions  ? Amitriptyline Shortness Of Breath and Palpitations  ? Lyrica [Pregabalin] Nausea And Vomiting  ? Pravachol [Pravastatin] Other (See Comments)  ?  Muscle cramps and aches   ? Effexor Xr [Venlafaxine] Anxiety  ? Morphine Palpitations  ? Zolpidem Anxiety and Palpitations  ? ? ?Outpatient Encounter Medications as of 11/10/2021  ?Medication Sig  ? acetaminophen (TYLENOL) 325 MG tablet Take 650 mg by mouth every 6 (six) hours as needed for moderate pain or headache.  ? amiodarone (PACERONE) 200 MG tablet Take 1 tablet (200 mg total) by mouth daily.  ? apixaban (ELIQUIS) 5 MG TABS tablet Take 1 tablet (5 mg total) by mouth 2 (two) times daily.  ? B-D UF III MINI PEN NEEDLES 31G X 5 MM MISC USE 1 DAILY WITH LANTUS PEN INJECTIONS  ? carvedilol (COREG) 3.125 MG  tablet TAKE 1 TABLET(3.125 MG) BY MOUTH TWICE DAILY WITH A MEAL  ? cholecalciferol (VITAMIN D3) 25 MCG (1000 UNIT) tablet Take 1,000 Units by mouth daily.  ? co-enzyme Q-10 50 MG capsule Take 50 mg by mouth daily.  ? doxycycline (VIBRA-TABS) 100 MG tablet Take 1 tablet (100 mg total) by mouth 2 (two) times daily.  ? ferrous sulfate 325 (65 FE) MG EC tablet TAKE 1 TABLET(325 MG) BY MOUTH DAILY WITH BREAKFAST (Patient taking differently: Take 325 mg by mouth daily with breakfast.)  ? glucosamine-chondroitin 500-400 MG tablet Take 1 tablet by mouth 3 (three) times daily.  ? LANTUS SOLOSTAR 100 UNIT/ML Solostar Pen INJECT 13 UNITS INTO THE SKIN DAILY  ? levothyroxine (SYNTHROID) 100 MCG tablet TAKE 1 TABLET(100 MCG) BY MOUTH DAILY  ? lidocaine (LIDODERM) 5 % Place 1 patch onto the skin daily. Remove & Discard patch within 12 hours or as directed by MD  ? midodrine (PROAMATINE) 2.5 MG tablet Take 1 tablet (2.5 mg total) by mouth 3 (three) times daily with meals.  ? mirtazapine (REMERON SOL-TAB) 15 MG disintegrating tablet DISSOLVE 1 TABLET(15 MG) ON THE TONGUE AT BEDTIME (Patient taking differently: Take 15 mg by mouth at bedtime.)  ? Multiple Vitamin (MULTIVITAMIN) tablet Take 1 tablet by mouth daily.  ? oxyCODONE-acetaminophen (PERCOCET) 10-325 MG tablet Take 1 tablet by mouth every 4 (four) hours as needed for pain.  ? Red Yeast Rice Extract (RED YEAST RICE PO) Take 1 capsule by mouth in the morning and at bedtime.  ? rosuvastatin (CRESTOR) 20 MG tablet Take 1 tablet (  20 mg total) by mouth at bedtime.  ? tamsulosin (FLOMAX) 0.4 MG CAPS capsule Take 1 capsule (0.4 mg total) by mouth daily after supper.  ? ?No facility-administered encounter medications on file as of 11/10/2021.  ? ? ?Patient Active Problem List  ? Diagnosis Date Noted  ? Near syncope 03/16/2021  ? Clotting disorder ([place]) 03/16/2021  ? Orthostatic hypotension 10/01/2020  ? TIA (transient ischemic attack) 08/31/2020  ? Normocytic anemia 08/31/2020  ?  Prolonged QT interval 08/31/2020  ? Acute CVA (cerebrovascular accident) ([place]) 08/31/2020  ? Debility 08/21/2020  ? Lytic bone lesion of femur 08/21/2020  ? Sick sinus syndrome ([place]) 07/31/2020  ? Coronary artery disease s/p CABG 2001 07/17/2020  ? Hypothyroidism 07/17/2020  ? Venous thromboembolism (VTE) 07/17/2020  ? Hypertension associated with diabetes ([place]) 07/17/2020  ? Cardiac pacemaker 07/17/2020  ? Osteoarthritis 07/17/2020  ? Type 2 diabetes mellitus with hyperglycemia ([place]) 07/17/2020  ? Vitamin D deficiency 07/17/2020  ? Macular degeneration 07/17/2020  ? Dyslipidemia due to type 2 diabetes mellitus ([place]) 07/17/2020  ? Insomnia 07/17/2020  ? BPH (benign prostatic hyperplasia) 07/17/2020  ? Colon polyps 07/17/2020  ? ? ?Conditions to be addressed/monitored:HTN and DMII ? ?Care Plan : [facility]  General Plan of Care (Adult)  ?Updates made by Leona Singleton, RN since 11/10/2021 12:00 AM  ?  ? ?Problem: Knowledge deficit of wife caring for patient with diabetes and HTN   ?Priority: Medium  ?  ? ?Long-Range Goal: Wife will work with CCM team to gain knowledge about chronic care conditions.   ?Start Date: 05/21/2021  ?Expected End Date: 05/21/2022  ?Priority: Medium  ?Note:   ?Current Barriers:  ?Knowledge Deficits related to plan of care for management of HTN and DMII  ?Chronic Disease Management support and education needs related to HTN and DMII  Spoke with wife per patient request and patient very hard of hearing.  Wife reports patient is doing well.  Denies any pain, shortness of breath or swelling.  Last blood sugar was 145 with recent ranges of 130-140's; BP ranges 120-10/80's.  Does acknowledge patient had fall in  the home without injury last month.  Wife states patient does well with home health therapy and would like for pateint to resume after the new year.   ?2/9--Wife reports patient up all night and sleeps all day; states increased Remeron dose per provider instruction for the last week with no  change in sleep pattern.  Blood sugar  157 after eating this morning with recent ranges of 100-150's.  BP 167/80.  Does complain of knee pain wife plans to discuss with PCP ?3/16--doing well and sleeping throughout the night with the increase of bedtime medication.  BP in normal range 130/80'S; blood sugars ranging 80-140'S after eating.  Denies any issues or concerns ?4/27--Wife concerned about patients deteriating conditions.  Has wound to heel; going to podiatrist for evaluation.  Wife requesting [facility] Nurse to assist with dressing changes for the wound.  Is receiving HHPT ([place]).  Also requesting [facility] bath aide and hospital bed.  Has had multiple falls, last one e fell on the wife; denies injuries.  Yesterday's vitals: 130/87, 95 % RA, HR 74, CBG ranges 130-150's. ?5/16--still awaiting hospital bed and home health nursing/aide orders.  Reports patient is sleeping in recliner.  Still has wound to heel open to air per provider instruction.  BP 118/68, fasting blood sugars 130-140's.  Continues to receive home therapy. ? ?RNCM Clinical Goal(s):  ?Patient will verbalize understanding of  plan for management of HTN and DMII as evidenced by checking blood pressures and blood sugars ?verbalize basic understanding of HTN and DMII disease process and self health management plan as evidenced by maintaining Hgb A1C of below 7.5  through collaboration with RN Care manager, provider, and care team.  ? ?Interventions: ?1:1 collaboration with primary care provider regarding development and update of comprehensive plan of care as evidenced by provider attestation and co-signature ?Inter-disciplinary care team collaboration (see longitudinal plan of care) ?Evaluation of current treatment plan related to  self management and patient's adherence to plan as established by provider. ? ?Diabetes:  (Status: Goal on Track (progressing): YES.) Long Term Goal  ? ?Lab Results  ?Component Value Date  ? HGBA1C 7.7 (A) 10/12/2021  ?Assessed  patient's understanding of A1c goal: <8% ?Provided education to patient about basic DM disease process; ?Reviewed medications with patient and discussed importance of medication adherence;        ?Discussed pla

## 2021-11-10 NOTE — Patient Instructions (Addendum)
Visit Information ? ?Thank you for taking time to visit with me today. Please don't hesitate to contact me if I can be of assistance to you before our next scheduled telephone appointment. ? ?Following are the goals we discussed today:  ?Take medications as prescribed   ?Attend all scheduled provider appointments ?Call provider office for new concerns or questions  ?check blood sugar at prescribed times: once daily ?check feet daily for cuts, sores or redness ?enter blood sugar readings and medication or insulin into daily log ?take the blood sugar log to all doctor visits ?check blood pressure 3 times per week ?write blood pressure results in a log or diary ?take blood pressure log to all doctor appointments ?call doctor for signs and symptoms of high blood pressure ?develop an action plan for high blood pressure ?keep all doctor appointments ?take medications for blood pressure exactly as prescribed ?report new symptoms to your doctor ?Slowly try to adjust sleep pattern ? ?Our next appointment is by telephone on 5/25 at 1400 ? ?Please call the care guide team at [phone] if you need to cancel or reschedule your appointment.  ? ?If you are experiencing a Mental Health or [place] or need someone to talk to, please call the Suicide and Crisis Lifeline: 988 ?call the [place] National Suicide Prevention Lifeline: 6402889386 or TTY: 941-361-2554 TTY (989)770-2943) to talk to a trained counselor ?call 1-800-273-TALK (toll free, 24 hour hotline) ?call 911  ? ?Patient verbalizes understanding of instructions and care plan provided today and agrees to view in [facility]. Active MyChart status confirmed with patient.   ? ?Hubert Azure RN, MSN ?RN Care Management Coordinator  ?[facility] ?[phone] ?[email or web address] ? ?

## 2021-11-11 NOTE — Progress Notes (Signed)
Remote pacemaker transmission.   

## 2021-11-13 ENCOUNTER — Telehealth: Payer: Self-pay

## 2021-11-13 NOTE — Telephone Encounter (Signed)
**Note De-Identified Thomas [name] Obfuscation** Eliquis PA started through covermymeds. Key: [id number]

## 2021-11-16 ENCOUNTER — Other Ambulatory Visit: Payer: Self-pay | Admitting: Pharmacist

## 2021-11-16 MED ORDER — APIXABAN 5 MG PO TABS: 5.0000 mg | ORAL_TABLET | Freq: Two times a day (BID) | ORAL | 1 refills | Status: DC

## 2021-11-16 NOTE — Telephone Encounter (Signed)
Prescription refill request for Eliquis received. Indication:PE and afib Last office visit:04/28/21 Scr:1.46 Age: 88 Weight: 72.6

## 2021-11-16 NOTE — Telephone Encounter (Signed)
Received fax that Eliquis is on pt's formulary as a Tier 3 medication.

## 2021-11-18 ENCOUNTER — Ambulatory Visit: Payer: Medicare Other | Admitting: Podiatry

## 2021-11-19 ENCOUNTER — Ambulatory Visit: Payer: Medicare Other | Admitting: Podiatry

## 2021-11-19 ENCOUNTER — Telehealth: Payer: Self-pay | Admitting: Family Medicine

## 2021-11-19 ENCOUNTER — Telehealth: Payer: Self-pay | Admitting: *Deleted

## 2021-11-19 ENCOUNTER — Other Ambulatory Visit: Payer: Self-pay | Admitting: *Deleted

## 2021-11-19 ENCOUNTER — Ambulatory Visit: Payer: Medicare Other | Admitting: *Deleted

## 2021-11-19 DIAGNOSIS — R5381 Other malaise: Secondary | ICD-10-CM

## 2021-11-19 DIAGNOSIS — L03119 Cellulitis of unspecified part of limb: Secondary | ICD-10-CM

## 2021-11-19 DIAGNOSIS — L02619 Cutaneous abscess of unspecified foot: Secondary | ICD-10-CM | POA: Diagnosis not present

## 2021-11-19 DIAGNOSIS — I10 Essential (primary) hypertension: Secondary | ICD-10-CM

## 2021-11-19 DIAGNOSIS — E1165 Type 2 diabetes mellitus with hyperglycemia: Secondary | ICD-10-CM

## 2021-11-19 MED ORDER — DOXYCYCLINE HYCLATE 100 MG PO TABS
100.0000 mg | ORAL_TABLET | Freq: Two times a day (BID) | ORAL | 1 refills | Status: DC
Start: 2021-11-19 — End: 2022-01-04

## 2021-11-19 NOTE — Patient Instructions (Signed)
Visit Information  Thank you for taking time to visit with me today. Please don't hesitate to contact me if I can be of assistance to you before our next scheduled telephone appointment.  Following are the goals we discussed today:   Take medications as prescribed    Attend all scheduled provider appointments  Call provider office for new concerns or questions   check blood sugar at prescribed times: once daily  check feet daily for cuts, sores or redness  enter blood sugar readings and medication or insulin into daily log  take the blood sugar log to all doctor visits  check blood pressure 3 times per week  write blood pressure results in a log or diary  take blood pressure log to all doctor appointments  call doctor for signs and symptoms of high blood pressure  develop an action plan for high blood pressure  keep all doctor appointments  take medications for blood pressure exactly as prescribed  report new symptoms to your doctor  Slowly try to adjust sleep pattern  Our next appointment is by telephone on 6/1 at 1000  Please call the care guide team at [phone] if you need to cancel or reschedule your appointment.   If you are experiencing a Mental Health or Behavioral Health Crisis or need someone to talk to, please call the Suicide and Crisis Lifeline: 988 call the [place] National Suicide Prevention Lifeline: 727-666-9027 or TTY: 804-615-9063 TTY 304 616 7825) to talk to a trained counselor call 1-800-273-TALK (toll free, 24 hour hotline) call 911   Patient verbalizes understanding of instructions and care plan provided today and agrees to view in MyChart. Active MyChart status and patient understanding of how to access instructions and care plan via MyChart confirmed with patient.     Rhae Lerner RN, MSN RN Care Management Coordinator  [facility] [phone] [email or web address]

## 2021-11-19 NOTE — Telephone Encounter (Signed)
Ok with me. Please place any necessary orders. 

## 2021-11-19 NOTE — Telephone Encounter (Signed)
Spoke with Jeannine Kitten requesting referral for Home health Referral placed

## 2021-11-19 NOTE — Telephone Encounter (Signed)
Thomas Mirza, RN requesting referral for  Home Health RN and [facility] for bathing and wound care  Ok to placed order

## 2021-11-19 NOTE — Progress Notes (Signed)
Subjective:   Patient ID: Thomas [name], male   DOB: 88 y.o.   MRN: [id number]   HPI Patient presents with caregiver stating he seems to be doing better having less pain but we wanted to get this area checked again.   ROS      Objective:  Physical Exam  Patient is a 88 year old man who is not mentally acute currently who has probable chronic vascular disease that would be very difficult to do anything about has crusted tissue on the medial side of the left heel localized no erythema no edema no drainage associated with it mild discomfort     Assessment:  Ulceration with crusted tissue with probable abscess left medial heel that is stable currently     Plan:  Reviewed condition discussed with the removed some of the scab from the outside and they will continue to offload it and continue to elevate and is precautionary measure we will continue to keep him on doxycycline.  I do not see where we can do much about this currently but I am hopeful that this will remain relatively stable and I gave strict instructions if there should be any erythema edema or drainage or systemic signs of infection have to go straight to the hospital may require IV antibiotics.  I am hopeful this will eventually heal uneventfully but may very difficult to make that determination with again debridement accomplished today no drainage noted upon doing this

## 2021-11-19 NOTE — Telephone Encounter (Signed)
Thomas [name] with Chronic Care Management is asking for a return call regarding this pt. [phone]

## 2021-11-20 ENCOUNTER — Other Ambulatory Visit: Payer: Self-pay

## 2021-11-20 ENCOUNTER — Ambulatory Visit: Payer: Medicare Other | Admitting: Cardiology

## 2021-11-20 ENCOUNTER — Telehealth: Payer: Self-pay

## 2021-11-20 DIAGNOSIS — R279 Unspecified lack of coordination: Secondary | ICD-10-CM | POA: Diagnosis not present

## 2021-11-20 DIAGNOSIS — I495 Sick sinus syndrome: Secondary | ICD-10-CM | POA: Diagnosis not present

## 2021-11-20 DIAGNOSIS — I2510 Atherosclerotic heart disease of native coronary artery without angina pectoris: Secondary | ICD-10-CM | POA: Diagnosis not present

## 2021-11-20 DIAGNOSIS — M1990 Unspecified osteoarthritis, unspecified site: Secondary | ICD-10-CM | POA: Diagnosis not present

## 2021-11-20 DIAGNOSIS — I69398 Other sequelae of cerebral infarction: Secondary | ICD-10-CM | POA: Diagnosis not present

## 2021-11-20 DIAGNOSIS — E1169 Type 2 diabetes mellitus with other specified complication: Secondary | ICD-10-CM | POA: Diagnosis not present

## 2021-11-20 DIAGNOSIS — E039 Hypothyroidism, unspecified: Secondary | ICD-10-CM | POA: Diagnosis not present

## 2021-11-20 DIAGNOSIS — I10 Essential (primary) hypertension: Secondary | ICD-10-CM | POA: Diagnosis not present

## 2021-11-20 DIAGNOSIS — M899 Disorder of bone, unspecified: Secondary | ICD-10-CM | POA: Diagnosis not present

## 2021-11-20 DIAGNOSIS — D649 Anemia, unspecified: Secondary | ICD-10-CM | POA: Diagnosis not present

## 2021-11-20 DIAGNOSIS — E785 Hyperlipidemia, unspecified: Secondary | ICD-10-CM | POA: Diagnosis not present

## 2021-11-20 DIAGNOSIS — H3530 Unspecified macular degeneration: Secondary | ICD-10-CM | POA: Diagnosis not present

## 2021-11-20 DIAGNOSIS — E559 Vitamin D deficiency, unspecified: Secondary | ICD-10-CM | POA: Diagnosis not present

## 2021-11-20 DIAGNOSIS — R2681 Unsteadiness on feet: Secondary | ICD-10-CM | POA: Diagnosis not present

## 2021-11-20 DIAGNOSIS — I951 Orthostatic hypotension: Secondary | ICD-10-CM | POA: Diagnosis not present

## 2021-11-20 DIAGNOSIS — G4700 Insomnia, unspecified: Secondary | ICD-10-CM | POA: Diagnosis not present

## 2021-11-20 NOTE — Telephone Encounter (Signed)
Melissa from [facility] called to advise they are still working on trying to get the hospital bed out to patient. They are waiting on amended last office note including narrative for approval. The note needs to include pt conditions, why he is need of hospital bed suck as frequent position changes, degree of head elevation, semi electric bed, etc. Once this last office note is amended I'll be more than happy to send over to her today.

## 2021-11-20 NOTE — Telephone Encounter (Signed)
Can we have him for an appointment? I last saw him over a month ago.  Katina Degree. [name], MD 11/20/2021 11:04 AM

## 2021-11-20 NOTE — Telephone Encounter (Signed)
Called pt and lvm to call office to schedule office visit, please contact pt and try to schedule an office visit.

## 2021-11-20 NOTE — Telephone Encounter (Signed)
Left vm to call back to schedule  °

## 2021-11-20 NOTE — Chronic Care Management (AMB) (Signed)
Chronic Care Management   CCM RN Visit Note  11/20/2021 Name: Thomas [name] MRN: [id number] DOB: [birth year]  Subjective: Thomas [name] is a 88 y.o. year old male who is a primary care patient of [name], [name], MD. The care management team was consulted for assistance with disease management and care coordination needs.    Engaged with patient by telephone for follow up visit in response to provider referral for case management and/or care coordination services.   Consent to Services:  The patient was given information about Chronic Care Management services, agreed to services, and gave verbal consent prior to initiation of services.  Please see initial visit note for detailed documentation.   Patient agreed to services and verbal consent obtained.   Assessment: Review of patient past medical history, allergies, medications, health status, including review of consultants reports, laboratory and other test data, was performed as part of comprehensive evaluation and provision of chronic care management services.   SDOH (Social Determinants of Health) assessments and interventions performed:    CCM Care Plan  Allergies  Allergen Reactions   Amitriptyline Shortness Of Breath and Palpitations   Lyrica [Pregabalin] Nausea And Vomiting   Pravachol [Pravastatin] Other (See Comments)    Muscle cramps and aches    Effexor Xr [Venlafaxine] Anxiety   Morphine Palpitations   Zolpidem Anxiety and Palpitations    Outpatient Encounter Medications as of 11/19/2021  Medication Sig   acetaminophen (TYLENOL) 325 MG tablet Take 650 mg by mouth every 6 (six) hours as needed for moderate pain or headache.   amiodarone (PACERONE) 200 MG tablet Take 1 tablet (200 mg total) by mouth daily.   apixaban (ELIQUIS) 5 MG TABS tablet Take 1 tablet (5 mg total) by mouth 2 (two) times daily.   B-D UF III MINI PEN NEEDLES 31G X 5 MM MISC USE 1 DAILY WITH LANTUS PEN INJECTIONS   carvedilol (COREG) 3.125 MG  tablet TAKE 1 TABLET(3.125 MG) BY MOUTH TWICE DAILY WITH A MEAL   cholecalciferol (VITAMIN D3) 25 MCG (1000 UNIT) tablet Take 1,000 Units by mouth daily.   co-enzyme Q-10 50 MG capsule Take 50 mg by mouth daily.   doxycycline (VIBRA-TABS) 100 MG tablet Take 1 tablet (100 mg total) by mouth 2 (two) times daily.   doxycycline (VIBRA-TABS) 100 MG tablet Take 1 tablet (100 mg total) by mouth 2 (two) times daily.   ferrous sulfate 325 (65 FE) MG EC tablet TAKE 1 TABLET(325 MG) BY MOUTH DAILY WITH BREAKFAST (Patient taking differently: Take 325 mg by mouth daily with breakfast.)   glucosamine-chondroitin 500-400 MG tablet Take 1 tablet by mouth 3 (three) times daily.   LANTUS SOLOSTAR 100 UNIT/ML Solostar Pen INJECT 13 UNITS INTO THE SKIN DAILY   levothyroxine (SYNTHROID) 100 MCG tablet TAKE 1 TABLET(100 MCG) BY MOUTH DAILY   lidocaine (LIDODERM) 5 % Place 1 patch onto the skin daily. Remove & Discard patch within 12 hours or as directed by MD   midodrine (PROAMATINE) 2.5 MG tablet Take 1 tablet (2.5 mg total) by mouth 3 (three) times daily with meals.   mirtazapine (REMERON SOL-TAB) 15 MG disintegrating tablet DISSOLVE 1 TABLET(15 MG) ON THE TONGUE AT BEDTIME (Patient taking differently: Take 15 mg by mouth at bedtime.)   Multiple Vitamin (MULTIVITAMIN) tablet Take 1 tablet by mouth daily.   oxyCODONE-acetaminophen (PERCOCET) 10-325 MG tablet Take 1 tablet by mouth every 4 (four) hours as needed for pain.   Red Yeast Rice Extract (RED YEAST RICE PO) Take 1  capsule by mouth in the morning and at bedtime.   rosuvastatin (CRESTOR) 20 MG tablet Take 1 tablet (20 mg total) by mouth at bedtime.   tamsulosin (FLOMAX) 0.4 MG CAPS capsule Take 1 capsule (0.4 mg total) by mouth daily after supper.   No facility-administered encounter medications on file as of 11/19/2021.    Patient Active Problem List   Diagnosis Date Noted   Near syncope 03/16/2021   Clotting disorder ([place]) 03/16/2021   Orthostatic  hypotension 10/01/2020   TIA (transient ischemic attack) 08/31/2020   Normocytic anemia 08/31/2020   Prolonged QT interval 08/31/2020   Acute CVA (cerebrovascular accident) ([place]) 08/31/2020   Debility 08/21/2020   Lytic bone lesion of femur 08/21/2020   Sick sinus syndrome ([place]) 07/31/2020   Coronary artery disease s/p CABG 2001 07/17/2020   Hypothyroidism 07/17/2020   Venous thromboembolism (VTE) 07/17/2020   Hypertension associated with diabetes ([place]) 07/17/2020   Cardiac pacemaker 07/17/2020   Osteoarthritis 07/17/2020   Type 2 diabetes mellitus with hyperglycemia ([place]) 07/17/2020   Vitamin D deficiency 07/17/2020   Macular degeneration 07/17/2020   Dyslipidemia due to type 2 diabetes mellitus ([place]) 07/17/2020   Insomnia 07/17/2020   BPH (benign prostatic hyperplasia) 07/17/2020   Colon polyps 07/17/2020    Conditions to be addressed/monitored:HTN and DMII  Care Plan : [place] (Adult)  Updates made by Leona Singleton, RN since 11/20/2021 12:00 AM     Problem: Knowledge deficit of wife caring for patient with diabetes and HTN   Priority: Medium     Long-Range Goal: Wife will work with CCM team to gain knowledge about chronic care conditions.   Start Date: 05/21/2021  Expected End Date: 05/21/2022  Priority: Medium  Note:   Current Barriers:  Knowledge Deficits related to plan of care for management of HTN and DMII  Chronic Disease Management support and education needs related to HTN and DMII  Spoke with wife per patient request and patient very hard of hearing.  Wife reports patient is doing well.  Denies any pain, shortness of breath or swelling.  Last blood sugar was 145 with recent ranges of 130-140's; BP ranges 120-10/80's.  Does acknowledge patient had fall in  the home without injury last month.  Wife states patient does well with home health therapy and would like for pateint to resume after the new year.   2/9--Wife reports patient up all night  and sleeps all day; states increased Remeron dose per provider instruction for the last week with no change in sleep pattern.  Blood sugar  157 after eating this morning with recent ranges of 100-150's.  BP 167/80.  Does complain of knee pain wife plans to discuss with PCP 3/16--doing well and sleeping throughout the night with the increase of bedtime medication.  BP in normal range 130/80'S; blood sugars ranging 80-140'S after eating.  Denies any issues or concerns 4/27--Wife concerned about patients HHPT ([facility]).  Also requesting [facility] bath aide and hospital bed.  Has had multiple falls, last one e fell on the wife; denies injuries.  Yesterday's vitals: 130/87, 95 % RA, HR 74, CBG ranges 130-150's. 5/16--still awaiting hospital bed and home health nursing/aide orders.  Reports patient is sleeping in recliner.  Still has wound to heel open to air per provider instruction.  BP 118/68, fasting blood sugars 130-140's.  Continues to receive home therapy. 5/25--Spoke with wife; still does not have hospital bed nor home health nurse/aide.  RNCM contacted [employer] for ordered  DME (bed); spoke with Caryl Asp wjo sent email to dispensing area location; awaiting call back to verify hold up with Hospital bed.  RNCM outreached to PCP office spoke with Junious Dresser CMA who stated she will place order for HHNurse/Aide.  Wife also reports patient had to be lowered to floor for near fall today and BP was 80/60; stated BP was better.  Requested wife notify PCP with this near fall information; CMA Junious Dresser updated.  5/26--spoke with Melissa with [employer], they are needing more documentation and she has spoken with Kela CMA at office prior to bed being delivered.  Wife updated  RNCM Clinical Goal(s):  Patient will verbalize understanding of plan for management of HTN and DMII as evidenced by checking blood pressures and blood sugars verbalize basic understanding of HTN and DMII disease process and self health management plan as evidenced by  maintaining Hgb A1C of below 7.5  through collaboration with RN Care manager, provider, and care team.   Interventions: 1:1 collaboration with primary care provider regarding development and update of comprehensive plan of care as evidenced by provider attestation and co-signature Inter-disciplinary care team collaboration (see longitudinal plan of care) Evaluation of current treatment plan related to  self management and patient's adherence to plan as established by provider.  Diabetes:  (Status: Goal on Track (progressing): YES.) Long Term Goal   Lab Results  Component Value Date   HGBA1C 7.7 (A) 10/12/2021  Assessed patient's understanding of A1c goal: <8% Provided education to patient about basic DM disease process; Reviewed medications with patient and discussed importance of medication adherence;        Discussed plans with patient for ongoing care management follow up and provided patient with direct contact information for care management team;      Provided patient with written educational materials related to hypo and hyperglycemia and importance of correct treatment;       Advised patient, providing education and rationale, to check cbg once daily and when you have symptoms of low or high blood sugar and record        call provider for findings outside established parameters;       Reviewed current glucose ranges and discussed ways to keep them in range Congratulated on A1C maintaining below 8  Hypertension: (Status: Goal on Track (progressing): YES.)  Long Term Goal Last practice recorded BP readings:  BP Readings from Last 3 Encounters:  10/12/21 117/77  10/10/21 (!) 211/85  09/13/21 (!) 158/70  Most recent eGFR/CrCl:  Lab Results  Component Value Date   EGFR 65 04/28/2021    No components found for: CRCL  Evaluation of current treatment plan related to hypertension self management and patient's adherence to plan as established by provider;   Provided education to  patient re: stroke prevention, s/s of heart attack and stroke; Reviewed prescribed diet low salt carb modified Reviewed medications with patient and discussed importance of compliance;  Advised patient, providing education and rationale, to monitor blood pressure daily and record, calling PCP for findings outside established parameters;  Discussed complications of poorly controlled blood pressure such as heart disease, stroke, circulatory complications, vision complications, kidney impairment, sexual dysfunction;  Encouraged to keep blood pressure log Encouraged and instructed to take log to provider appointments Fall precautions and preventions reviewed and discussed Encouraged to obtain life alert Discussed and requested hhnurse, aide orders from PCP Instructed wife to notify provider of near fall  Patient Goals/Self-Care Activities: Take medications as prescribed   Attend all scheduled provider appointments  Call provider office for new concerns or questions  check blood sugar at prescribed times: once daily check feet daily for cuts, sores or redness enter blood sugar readings and medication or insulin into daily log take the blood sugar log to all doctor visits check blood pressure 3 times per week write blood pressure results in a log or diary take blood pressure log to all doctor appointments call doctor for signs and symptoms of high blood pressure develop an action plan for high blood pressure keep all doctor appointments take medications for blood pressure exactly as prescribed report new symptoms to your doctor Slowly try to adjust sleep pattern       Plan:The care management team will reach out to the patient again over the next 20 days.  Hubert Azure RN, MSN RN Care Management Coordinator  [place] [phone] [email or web address]

## 2021-11-23 NOTE — Telephone Encounter (Signed)
Referral placed.

## 2021-11-25 ENCOUNTER — Telehealth: Payer: Self-pay | Admitting: Family Medicine

## 2021-11-25 ENCOUNTER — Ambulatory Visit: Payer: Medicare Other | Admitting: Podiatry

## 2021-11-25 DIAGNOSIS — I951 Orthostatic hypotension: Secondary | ICD-10-CM | POA: Diagnosis not present

## 2021-11-25 DIAGNOSIS — Z794 Long term (current) use of insulin: Secondary | ICD-10-CM | POA: Diagnosis not present

## 2021-11-25 DIAGNOSIS — I10 Essential (primary) hypertension: Secondary | ICD-10-CM

## 2021-11-25 DIAGNOSIS — Z87891 Personal history of nicotine dependence: Secondary | ICD-10-CM | POA: Diagnosis not present

## 2021-11-25 DIAGNOSIS — G451 Carotid artery syndrome (hemispheric): Secondary | ICD-10-CM | POA: Diagnosis not present

## 2021-11-25 DIAGNOSIS — E1169 Type 2 diabetes mellitus with other specified complication: Secondary | ICD-10-CM | POA: Diagnosis not present

## 2021-11-25 NOTE — Telephone Encounter (Signed)
.  Home Health Certification or Plan of Care Tracking  Is this a Certification or Plan of Care? yes  HH Agency: [facility]   Order Number:  [id number]  Has charge sheet been attached? yes  Where has form been placed:  In provider's box  Faxed to:   [phone]

## 2021-11-26 ENCOUNTER — Ambulatory Visit (INDEPENDENT_AMBULATORY_CARE_PROVIDER_SITE_OTHER): Payer: Medicare Other | Admitting: *Deleted

## 2021-11-26 DIAGNOSIS — Z794 Long term (current) use of insulin: Secondary | ICD-10-CM

## 2021-11-26 DIAGNOSIS — R5381 Other malaise: Secondary | ICD-10-CM

## 2021-11-26 DIAGNOSIS — I10 Essential (primary) hypertension: Secondary | ICD-10-CM

## 2021-11-26 NOTE — Chronic Care Management (AMB) (Signed)
Chronic Care Management   CCM RN Visit Note  11/26/2021 Name: Thomas [name] MRN: [id number] DOB: [birth year]  Subjective: Thomas [name] is a 88 y.o. year old male who is a primary care patient of [name], [name], MD. The care management team was consulted for assistance with disease management and care coordination needs.    Engaged with patient by telephone for follow up visit in response to provider referral for case management and/or care coordination services.   Consent to Services:  The patient was given information about Chronic Care Management services, agreed to services, and gave verbal consent prior to initiation of services.  Please see initial visit note for detailed documentation.   Patient agreed to services and verbal consent obtained.   Assessment: Review of patient past medical history, allergies, medications, health status, including review of consultants reports, laboratory and other test data, was performed as part of comprehensive evaluation and provision of chronic care management services.   SDOH (Social Determinants of Health) assessments and interventions performed:    CCM Care Plan  Allergies  Allergen Reactions   Amitriptyline Shortness Of Breath and Palpitations   Lyrica [Pregabalin] Nausea And Vomiting   Pravachol [Pravastatin] Other (See Comments)    Muscle cramps and aches    Effexor Xr [Venlafaxine] Anxiety   Morphine Palpitations   Zolpidem Anxiety and Palpitations    Outpatient Encounter Medications as of 11/26/2021  Medication Sig   acetaminophen (TYLENOL) 325 MG tablet Take 650 mg by mouth every 6 (six) hours as needed for moderate pain or headache.   amiodarone (PACERONE) 200 MG tablet Take 1 tablet (200 mg total) by mouth daily.   apixaban (ELIQUIS) 5 MG TABS tablet Take 1 tablet (5 mg total) by mouth 2 (two) times daily.   B-D UF III MINI PEN NEEDLES 31G X 5 MM MISC USE 1 DAILY WITH LANTUS PEN INJECTIONS   carvedilol (COREG) 3.125 MG  tablet TAKE 1 TABLET(3.125 MG) BY MOUTH TWICE DAILY WITH A MEAL   cholecalciferol (VITAMIN D3) 25 MCG (1000 UNIT) tablet Take 1,000 Units by mouth daily.   co-enzyme Q-10 50 MG capsule Take 50 mg by mouth daily.   doxycycline (VIBRA-TABS) 100 MG tablet Take 1 tablet (100 mg total) by mouth 2 (two) times daily.   doxycycline (VIBRA-TABS) 100 MG tablet Take 1 tablet (100 mg total) by mouth 2 (two) times daily.   ferrous sulfate 325 (65 FE) MG EC tablet TAKE 1 TABLET(325 MG) BY MOUTH DAILY WITH BREAKFAST (Patient taking differently: Take 325 mg by mouth daily with breakfast.)   glucosamine-chondroitin 500-400 MG tablet Take 1 tablet by mouth 3 (three) times daily.   LANTUS SOLOSTAR 100 UNIT/ML Solostar Pen INJECT 13 UNITS INTO THE SKIN DAILY   levothyroxine (SYNTHROID) 100 MCG tablet TAKE 1 TABLET(100 MCG) BY MOUTH DAILY   lidocaine (LIDODERM) 5 % Place 1 patch onto the skin daily. Remove & Discard patch within 12 hours or as directed by MD   midodrine (PROAMATINE) 2.5 MG tablet Take 1 tablet (2.5 mg total) by mouth 3 (three) times daily with meals.   mirtazapine (REMERON SOL-TAB) 15 MG disintegrating tablet DISSOLVE 1 TABLET(15 MG) ON THE TONGUE AT BEDTIME (Patient taking differently: Take 15 mg by mouth at bedtime.)   Multiple Vitamin (MULTIVITAMIN) tablet Take 1 tablet by mouth daily.   oxyCODONE-acetaminophen (PERCOCET) 10-325 MG tablet Take 1 tablet by mouth every 4 (four) hours as needed for pain.   Red Yeast Rice Extract (RED YEAST RICE PO) Take 1  capsule by mouth in the morning and at bedtime.   rosuvastatin (CRESTOR) 20 MG tablet Take 1 tablet (20 mg total) by mouth at bedtime.   tamsulosin (FLOMAX) 0.4 MG CAPS capsule Take 1 capsule (0.4 mg total) by mouth daily after supper.   No facility-administered encounter medications on file as of 11/26/2021.    Patient Active Problem List   Diagnosis Date Noted   Near syncope 03/16/2021   Clotting disorder ([place]) 03/16/2021   Orthostatic  hypotension 10/01/2020   TIA (transient ischemic attack) 08/31/2020   Normocytic anemia 08/31/2020   Prolonged QT interval 08/31/2020   Acute CVA (cerebrovascular accident) ([place]) 08/31/2020   Debility 08/21/2020   Lytic bone lesion of femur 08/21/2020   Sick sinus syndrome ([place]) 07/31/2020   Coronary artery disease s/p CABG 2001 07/17/2020   Hypothyroidism 07/17/2020   Venous thromboembolism (VTE) 07/17/2020   Hypertension associated with diabetes ([place]) 07/17/2020   Cardiac pacemaker 07/17/2020   Osteoarthritis 07/17/2020   Type 2 diabetes mellitus with hyperglycemia ([place]) 07/17/2020   Vitamin D deficiency 07/17/2020   Macular degeneration 07/17/2020   Dyslipidemia due to type 2 diabetes mellitus ([place]) 07/17/2020   Insomnia 07/17/2020   BPH (benign prostatic hyperplasia) 07/17/2020   Colon polyps 07/17/2020    Conditions to be addressed/monitored:HTN and DMII  Care Plan : [place] (Adult)  Updates made by Leona Singleton, RN since 11/26/2021 12:00 AM     Problem: Knowledge deficit of wife caring for patient with diabetes and HTN   Priority: Medium     Long-Range Goal: Wife will work with CCM team to gain knowledge about chronic care conditions.   Start Date: 05/21/2021  Expected End Date: 05/21/2022  Priority: Medium  Note:   Current Barriers:  Knowledge Deficits related to plan of care for management of HTN and DMII  Chronic Disease Management support and education needs related to HTN and DMII  Spoke with wife per patient request and patient very hard of hearing.  Wife reports patient is doing well.  Denies any pain, shortness of breath or swelling.  Last blood sugar was 145 with recent ranges of 130-140's; BP ranges 120-10/80's.  Does acknowledge patient had fall in  the home without injury last month.  Wife states patient does well with home health therapy and would like for pateint to resume after the new year.   2/9--Wife reports patient up all night  and sleeps all day; states increased Remeron dose per provider instruction for the last week with no change in sleep pattern.  Blood sugar  157 after eating this morning with recent ranges of 100-150's.  BP 167/80.  Does complain of knee pain wife plans to discuss with PCP 3/16--doing well and sleeping throughout the night with the increase of bedtime medication.  BP in normal range 130/80'S; blood sugars ranging 80-140'S after eating.  Denies any issues or concerns 4/27--Wife concerned about patients HHPT ([facility]).  Also requesting [facility] bath aide and hospital bed.  Has had multiple falls, last one e fell on the wife; denies injuries.  Yesterday's vitals: 130/87, 95 % RA, HR 74, CBG ranges 130-150's. 5/16--still awaiting hospital bed and home health nursing/aide orders.  Reports patient is sleeping in recliner.  Still has wound to heel open to air per provider instruction.  BP 118/68, fasting blood sugars 130-140's.  Continues to receive home therapy. 5/25--Spoke with wife; still does not have hospital bed nor home health nurse/aide.  RNCM contacted [employer] for ordered  DME (bed); spoke with Caryl Asp wjo sent email to dispensing area location; awaiting call back to verify hold up with Hospital bed.  RNCM outreached to PCP office spoke with Junious Dresser CMA who stated she will place order for HHNurse/Aide.  Wife also reports patient had to be lowered to floor for near fall today and BP was 80/60; stated BP was better.  Requested wife notify PCP with this near fall information; CMA Junious Dresser updated.  5/26--spoke with Melissa with [employer], they are needing more documentation and she has spoken with Kela CMA at office prior to bed being delivered.  Wife updated 6/1--SPOKE with wife, she confirms hospital bed delivary to home yesterday.  Has not heard about HH nurse/aide but expects HHPT vusit today.  RNCM can see orders in system, encouraged wife to ask therapist if he could see in their system.  RNCM Clinical Goal(s):  Patient  will verbalize understanding of plan for management of HTN and DMII as evidenced by checking blood pressures and blood sugars verbalize basic understanding of HTN and DMII disease process and self health management plan as evidenced by maintaining Hgb A1C of below 7.5  through collaboration with RN Care manager, provider, and care team.   Interventions: 1:1 collaboration with primary care provider regarding development and update of comprehensive plan of care as evidenced by provider attestation and co-signature Inter-disciplinary care team collaboration (see longitudinal plan of care) Evaluation of current treatment plan related to  self management and patient's adherence to plan as established by provider.  Diabetes:  (Status: Goal on Track (progressing): YES.) Long Term Goal   Lab Results  Component Value Date   HGBA1C 7.7 (A) 10/12/2021  Assessed patient's understanding of A1c goal: <8% Provided education to patient about basic DM disease process; Reviewed medications with patient and discussed importance of medication adherence;        Discussed plans with patient for ongoing care management follow up and provided patient with direct contact information for care management team;      Provided patient with written educational materials related to hypo and hyperglycemia and importance of correct treatment;       Advised patient, providing education and rationale, to check cbg once daily and when you have symptoms of low or high blood sugar and record        call provider for findings outside established parameters;       Reviewed current glucose ranges and discussed ways to keep them in range Congratulated on A1C maintaining below 8  Hypertension: (Status: Goal on Track (progressing): YES.)  Long Term Goal Last practice recorded BP readings:  BP Readings from Last 3 Encounters:  10/12/21 117/77  10/10/21 (!) 211/85  09/13/21 (!) 158/70  Most recent eGFR/CrCl:  Lab Results  Component  Value Date   EGFR 65 04/28/2021    No components found for: CRCL  Evaluation of current treatment plan related to hypertension self management and patient's adherence to plan as established by provider;   Provided education to patient re: stroke prevention, s/s of heart attack and stroke; Reviewed prescribed diet low salt carb modified Reviewed medications with patient and discussed importance of compliance;  Advised patient, providing education and rationale, to monitor blood pressure daily and record, calling PCP for findings outside established parameters;  Discussed complications of poorly controlled blood pressure such as heart disease, stroke, circulatory complications, vision complications, kidney impairment, sexual dysfunction;  Encouraged to keep blood pressure log Encouraged and instructed to take log to provider appointments Fall  precautions and preventions reviewed and discussed Encouraged to obtain life alert Discussed and requested hhnurse, aide orders from PCP Instructed wife to notify provider of near fall  Patient Goals/Self-Care Activities: Take medications as prescribed   Attend all scheduled provider appointments Call provider office for new concerns or questions  check blood sugar at prescribed times: once daily check feet daily for cuts, sores or redness enter blood sugar readings and medication or insulin into daily log take the blood sugar log to all doctor visits check blood pressure 3 times per week write blood pressure results in a log or diary take blood pressure log to all doctor appointments call doctor for signs and symptoms of high blood pressure develop an action plan for high blood pressure keep all doctor appointments take medications for blood pressure exactly as prescribed report new symptoms to your doctor Slowly try to adjust sleep pattern       Plan:The care management team will reach out to the patient again over the next 45  days.  Hubert Azure RN, MSN RN Care Management Coordinator  [place] [phone] [email or web address]

## 2021-11-26 NOTE — Patient Instructions (Addendum)
Visit Information  Thank you for taking time to visit with me today. Please don't hesitate to contact me if I can be of assistance to you before our next scheduled telephone appointment.  Following are the goals we discussed today:   Patient Goals/Self-Care Activities: Take medications as prescribed   Attend all scheduled provider appointments Call provider office for new concerns or questions  check blood sugar at prescribed times: once daily check feet daily for cuts, sores or redness enter blood sugar readings and medication or insulin into daily log take the blood sugar log to all doctor visits check blood pressure 3 times per week write blood pressure results in a log or diary take blood pressure log to all doctor appointments call doctor for signs and symptoms of high blood pressure develop an action plan for high blood pressure keep all doctor appointments take medications for blood pressure exactly as prescribed report new symptoms to your doctor Slowly try to adjust sleep pattern  Our next appointment is by telephone on 7/11 at 1030  Please call the care guide team at [phone] if you need to cancel or reschedule your appointment.   If you are experiencing a Mental Health or Behavioral Health Crisis or need someone to talk to, please call the Suicide and Crisis Lifeline: 988 call the [place] National Suicide Prevention Lifeline: (623) 444-3508 or TTY: 862-800-1755 TTY 972 068 3878) to talk to a trained counselor call 1-800-273-TALK (toll free, 24 hour hotline) call 911   Patient verbalizes understanding of instructions and care plan provided today and agrees to view in MyChart. Active MyChart status and patient understanding of how to access instructions and care plan via MyChart confirmed with patient.     Rhae Lerner RN, MSN RN Care Management Coordinator  [facility] [place] [phone] [email or web address]

## 2021-11-27 NOTE — Telephone Encounter (Signed)
Form placed at PCP office to be sign  

## 2021-11-30 DIAGNOSIS — R32 Unspecified urinary incontinence: Secondary | ICD-10-CM

## 2021-11-30 DIAGNOSIS — E1169 Type 2 diabetes mellitus with other specified complication: Secondary | ICD-10-CM

## 2021-11-30 DIAGNOSIS — R2681 Unsteadiness on feet: Secondary | ICD-10-CM | POA: Diagnosis not present

## 2021-11-30 DIAGNOSIS — I951 Orthostatic hypotension: Secondary | ICD-10-CM

## 2021-11-30 DIAGNOSIS — R279 Unspecified lack of coordination: Secondary | ICD-10-CM | POA: Diagnosis not present

## 2021-11-30 DIAGNOSIS — I2510 Atherosclerotic heart disease of native coronary artery without angina pectoris: Secondary | ICD-10-CM

## 2021-11-30 DIAGNOSIS — Z794 Long term (current) use of insulin: Secondary | ICD-10-CM

## 2021-11-30 DIAGNOSIS — Z86010 Personal history of colonic polyps: Secondary | ICD-10-CM

## 2021-11-30 DIAGNOSIS — I495 Sick sinus syndrome: Secondary | ICD-10-CM

## 2021-11-30 DIAGNOSIS — E785 Hyperlipidemia, unspecified: Secondary | ICD-10-CM

## 2021-11-30 DIAGNOSIS — E039 Hypothyroidism, unspecified: Secondary | ICD-10-CM

## 2021-11-30 DIAGNOSIS — Z96651 Presence of right artificial knee joint: Secondary | ICD-10-CM

## 2021-11-30 DIAGNOSIS — D649 Anemia, unspecified: Secondary | ICD-10-CM

## 2021-11-30 DIAGNOSIS — E559 Vitamin D deficiency, unspecified: Secondary | ICD-10-CM

## 2021-11-30 DIAGNOSIS — M899 Disorder of bone, unspecified: Secondary | ICD-10-CM

## 2021-11-30 DIAGNOSIS — Z950 Presence of cardiac pacemaker: Secondary | ICD-10-CM

## 2021-11-30 DIAGNOSIS — Z951 Presence of aortocoronary bypass graft: Secondary | ICD-10-CM

## 2021-11-30 DIAGNOSIS — Z9181 History of falling: Secondary | ICD-10-CM

## 2021-11-30 DIAGNOSIS — I10 Essential (primary) hypertension: Secondary | ICD-10-CM

## 2021-11-30 DIAGNOSIS — G4700 Insomnia, unspecified: Secondary | ICD-10-CM

## 2021-11-30 DIAGNOSIS — H3530 Unspecified macular degeneration: Secondary | ICD-10-CM

## 2021-11-30 DIAGNOSIS — Z7901 Long term (current) use of anticoagulants: Secondary | ICD-10-CM

## 2021-11-30 DIAGNOSIS — N400 Enlarged prostate without lower urinary tract symptoms: Secondary | ICD-10-CM

## 2021-11-30 DIAGNOSIS — Z86718 Personal history of other venous thrombosis and embolism: Secondary | ICD-10-CM

## 2021-11-30 DIAGNOSIS — M1990 Unspecified osteoarthritis, unspecified site: Secondary | ICD-10-CM | POA: Diagnosis not present

## 2021-11-30 DIAGNOSIS — I69398 Other sequelae of cerebral infarction: Secondary | ICD-10-CM | POA: Diagnosis not present

## 2021-11-30 NOTE — Telephone Encounter (Signed)
Placed in box to be faxed.

## 2021-12-07 DIAGNOSIS — H47011 Ischemic optic neuropathy, right eye: Secondary | ICD-10-CM | POA: Diagnosis not present

## 2021-12-07 DIAGNOSIS — H353231 Exudative age-related macular degeneration, bilateral, with active choroidal neovascularization: Secondary | ICD-10-CM | POA: Diagnosis not present

## 2021-12-07 DIAGNOSIS — H43813 Vitreous degeneration, bilateral: Secondary | ICD-10-CM | POA: Diagnosis not present

## 2021-12-07 DIAGNOSIS — H35033 Hypertensive retinopathy, bilateral: Secondary | ICD-10-CM | POA: Diagnosis not present

## 2021-12-09 ENCOUNTER — Other Ambulatory Visit: Payer: Self-pay | Admitting: *Deleted

## 2021-12-09 MED ORDER — TAMSULOSIN HCL 0.4 MG PO CAPS
0.4000 mg | ORAL_CAPSULE | Freq: Every day | ORAL | 5 refills | Status: DC
Start: 2021-12-09 — End: 2022-08-31

## 2021-12-15 ENCOUNTER — Encounter: Payer: Self-pay | Admitting: Family Medicine

## 2021-12-16 NOTE — Telephone Encounter (Signed)
Please schedule appointment with any available provider. 

## 2021-12-20 DIAGNOSIS — H3530 Unspecified macular degeneration: Secondary | ICD-10-CM | POA: Diagnosis not present

## 2021-12-20 DIAGNOSIS — I69398 Other sequelae of cerebral infarction: Secondary | ICD-10-CM | POA: Diagnosis not present

## 2021-12-20 DIAGNOSIS — G4700 Insomnia, unspecified: Secondary | ICD-10-CM | POA: Diagnosis not present

## 2021-12-20 DIAGNOSIS — I495 Sick sinus syndrome: Secondary | ICD-10-CM | POA: Diagnosis not present

## 2021-12-20 DIAGNOSIS — E1169 Type 2 diabetes mellitus with other specified complication: Secondary | ICD-10-CM | POA: Diagnosis not present

## 2021-12-20 DIAGNOSIS — M1990 Unspecified osteoarthritis, unspecified site: Secondary | ICD-10-CM | POA: Diagnosis not present

## 2021-12-20 DIAGNOSIS — R279 Unspecified lack of coordination: Secondary | ICD-10-CM | POA: Diagnosis not present

## 2021-12-20 DIAGNOSIS — E785 Hyperlipidemia, unspecified: Secondary | ICD-10-CM | POA: Diagnosis not present

## 2021-12-20 DIAGNOSIS — I951 Orthostatic hypotension: Secondary | ICD-10-CM | POA: Diagnosis not present

## 2021-12-20 DIAGNOSIS — M899 Disorder of bone, unspecified: Secondary | ICD-10-CM | POA: Diagnosis not present

## 2021-12-20 DIAGNOSIS — E559 Vitamin D deficiency, unspecified: Secondary | ICD-10-CM | POA: Diagnosis not present

## 2021-12-20 DIAGNOSIS — I2510 Atherosclerotic heart disease of native coronary artery without angina pectoris: Secondary | ICD-10-CM | POA: Diagnosis not present

## 2021-12-20 DIAGNOSIS — I10 Essential (primary) hypertension: Secondary | ICD-10-CM | POA: Diagnosis not present

## 2021-12-20 DIAGNOSIS — E039 Hypothyroidism, unspecified: Secondary | ICD-10-CM | POA: Diagnosis not present

## 2021-12-20 DIAGNOSIS — D649 Anemia, unspecified: Secondary | ICD-10-CM | POA: Diagnosis not present

## 2021-12-20 DIAGNOSIS — R2681 Unsteadiness on feet: Secondary | ICD-10-CM | POA: Diagnosis not present

## 2021-12-22 ENCOUNTER — Ambulatory Visit: Payer: Medicare Other | Admitting: Family Medicine

## 2021-12-25 DIAGNOSIS — I10 Essential (primary) hypertension: Secondary | ICD-10-CM | POA: Diagnosis not present

## 2021-12-25 DIAGNOSIS — G451 Carotid artery syndrome (hemispheric): Secondary | ICD-10-CM | POA: Diagnosis not present

## 2021-12-25 DIAGNOSIS — E1159 Type 2 diabetes mellitus with other circulatory complications: Secondary | ICD-10-CM | POA: Diagnosis not present

## 2021-12-25 DIAGNOSIS — Z794 Long term (current) use of insulin: Secondary | ICD-10-CM | POA: Diagnosis not present

## 2021-12-25 DIAGNOSIS — I951 Orthostatic hypotension: Secondary | ICD-10-CM | POA: Diagnosis not present

## 2021-12-27 ENCOUNTER — Encounter: Payer: Self-pay | Admitting: Family Medicine

## 2021-12-30 NOTE — Telephone Encounter (Signed)
Please have him schedule an appointment ASAP.

## 2021-12-31 NOTE — Progress Notes (Deleted)
Cardiology Office Note:    Date:  12/31/2021   ID:  Thomas [name], DOB [birth year], MRN [id number]  PCP:  Vivi Barrack, MD   [facility] Cardiologist:  Freada Bergeron, MD {    Referring MD: Vivi Barrack, MD    History of Present Illness:    Thomas [name] is a 88 y.o. male with a hx of  HLD, DMII, prior CVA, HTN, CAD s/p CABG in 2001, SSS s/p PPM, pulmonary embolism on warfarin, orthostatic hypotension and hypothyroidism who returns to clinic for follow-up.  Patient has a history of MI at age 52 that was managed medically at that time. He had subsequent PCIs and MI in 2001 leading to bypass surgery. He states he has had no chest pain or shortness of breath since his surgery. No stents placed after bypass surgery. Blood pressures fluctuate significantly at home from 90/50s to 200s/100s. Was seen in HTN clinic and where his clonidine was weaned, pindolol was changed to coreg; ramipril and chlorthalidone were continued at current doses. Also saw Dr. Lovena Le with EP to manage his medtronic PPM which was functioning well.    TTE showed LVEF 60-65%, moderate basal-septal hypertrophy, G1DD, dilated aorta/aortic root, moderate aortic sclerosis without stenosis, moderate MAC, mild MR.   Had recent fall where he was seen in ED in 07/2020. CT head negative for acute pathology. MRI head with remote strokes (known) but no acute pathology.   Was seen on 10/11/20 for syncope thought to be due to coreg. He was recommended for admission but he did not want to stay. His coreg was decreased.   Last seen in clinic on 11/27/20. Had been off of many of his blood pressure medications due to orthostasis. He was also on floinef at that time. At that time, his blood pressure was relatively stable. Given his risk of falls, his warfarin was changed to apixaban and his aspirin was stopped.     Today, he is accompanied by his son. Patient's sons says patient is doing okay and his blood pressure  has been in the 140s/70s.He has resumed coreg 12.39m BID and is doing well on this with no episodes of lightheadedness, dizziness or syncope. He is currently working with PT to help his strength and mobility. Has been having low appetite and was started on remeron.   Patient denies chest pain, shortness of breath, abdominal pain, cramps, diarrhea, palpitations, nausea, headaches, lightheadedness, LE Edema, syncope,orthopnea or PND.  Past Medical History:  Diagnosis Date   Arthritis    [place]    Coronary artery disease    [place]    Hyperlipidemia    Hypertension    Hypothyroidism 07/17/2020   Kidney disease    Loss of hearing    Macular degeneration    Myocardial infarction ([facility]    Osteoarthritis    Stroke ([facility]     Past Surgical History:  Procedure Laterality Date   CARPAL TUNNEL RELEASE  2011   CORONARY ANGIOPLASTY WITH STENT PLACEMENT     CORONARY ARTERY BYPASS GRAFT  2001   x6   REPLACEMENT TOTAL KNEE     SPINE SURGERY  2008   cervical and lumbar   THYROIDECTOMY      Current Medications: No outpatient medications have been marked as taking for the 01/04/22 encounter (Appointment) with PFreada Bergeron MD.     Allergies:   Amitriptyline, Lyrica [pregabalin], Pravachol [pravastatin], Effexor xr [venlafaxine], Morphine, and Zolpidem   Social History  Socioeconomic History   Marital status: Married    Spouse name: Not on file   Number of children: Not on file   Years of education: Not on file   Highest education level: Not on file  Occupational History   Not on file  Tobacco Use   Smoking status: Former    Types: Pipe   Smokeless tobacco: Never  Vaping Use   Vaping Use: Never used  Substance and Sexual Activity   Alcohol use: Never   Drug use: Never   Sexual activity: Not on file  Other Topics Concern   Not on file  Social History Narrative   Not on file   Social Determinants of Health   Financial Resource Strain: Low Risk   (03/10/2021)   Overall Financial Resource Strain (CARDIA)    Difficulty of Paying Living Expenses: Not very hard  Food Insecurity: No Food Insecurity (05/14/2021)   Hunger Vital Sign    Worried About Running Out of Food in the Last Year: Never true    Ran Out of Food in the Last Year: Never true  Transportation Needs: No Transportation Needs (05/14/2021)   PRAPARE - Hydrologist (Medical): No    Lack of Transportation (Non-Medical): No  Physical Activity: Not on file  Stress: Not on file  Social Connections: Not on file     Family History: The patient's family history includes Cancer in his brother and daughter; Heart disease in his brother, brother, and sister; Hypertension in his son; Stroke in his father and mother.  ROS:   Review of Systems  Constitutional:  Negative for chills, fever and malaise/fatigue.  HENT:  Negative for ear pain and hearing loss.   Eyes:  Negative for photophobia and pain.  Respiratory:  Negative for cough and hemoptysis.   Cardiovascular:  Negative for chest pain, palpitations, orthopnea, claudication, leg swelling and PND.  Gastrointestinal:  Negative for blood in stool and diarrhea.  Genitourinary:  Negative for frequency and hematuria.  Musculoskeletal:  Negative for myalgias and neck pain.  Neurological:  Negative for dizziness, tingling and weakness.  Endo/Heme/Allergies:  Does not bruise/bleed easily.  Psychiatric/Behavioral:  Negative for hallucinations. The patient does not have insomnia.      EKGs/Labs/Other Studies Reviewed:    The following studies were reviewed today:  CT August 24, 2020: IMPRESSION: Atrophy with periventricular small vessel disease. Prior small infarcts in each thalamus as well as in the anterior limb of the left external capsule. No acute infarct is evident. No mass or hemorrhage.   There are foci of arterial vascular calcification. There are foci of paranasal sinus disease.   ECHO  08/07/2020:   IMPRESSIONS   1. Left ventricular ejection fraction, by estimation, is 60 to 65%. The  left ventricle has normal function. The left ventricle has no regional  wall motion abnormalities. There is moderate left ventricular hypertrophy  of the basal-septal segment. Left  ventricular diastolic parameters are consistent with Grade I diastolic  dysfunction (impaired relaxation). Elevated left ventricular end-diastolic  pressure.   2. Right ventricular systolic function is mildly reduced. The right  ventricular size is normal.   3. The mitral valve is degenerative. Mild mitral valve regurgitation. No  evidence of mitral stenosis. Moderate mitral annular calcification.   4. The aortic valve is normal in structure. There is mild calcification  of the aortic valve. There is mild thickening of the aortic valve. Aortic  valve regurgitation is not visualized. Mild to moderate aortic valve  sclerosis/calcification is present,  without any evidence of aortic stenosis.   5. Aortic dilatation noted. There is mild dilatation of the aortic root,  measuring 40 mm. There is mild dilatation of the ascending aorta,  measuring 43 mm.  CT abdomen/pelvis: FINDINGS: Lower chest: Visualized lung bases are unremarkable. Moderate size sliding-type hiatal hernia is noted.   Hepatobiliary: No focal liver abnormality is seen. No gallstones, gallbladder wall thickening, or biliary dilatation.   Pancreas: Unremarkable. No pancreatic ductal dilatation or surrounding inflammatory changes.   Spleen: Normal in size without focal abnormality.   Adrenals/Urinary Tract: Adrenal glands are unremarkable. Kidneys are normal, without renal calculi, focal lesion, or hydronephrosis. Bladder is unremarkable.   Stomach/Bowel: There is no evidence of bowel obstruction or inflammation. Diverticulosis of descending and sigmoid colon is noted without inflammation. The appendix is not visualized.    Vascular/Lymphatic: Aortic atherosclerosis. No enlarged abdominal or pelvic lymph nodes.   Reproductive: Prostate is unremarkable.   Other: Small fat containing periumbilical hernia is noted. No ascites is noted.   Musculoskeletal: Multiple small lucencies are noted throughout the pelvis and visualized proximal femurs concerning for possible lytic lesions related to multiple myeloma or metastatic disease.   IMPRESSION: 1. Moderate size sliding-type hiatal hernia. 2. Diverticulosis of descending and sigmoid colon is noted without inflammation. 3. Small fat containing periumbilical hernia. 4. Multiple small lucencies are noted throughout the pelvis and visualized proximal femurs concerning for possible lytic lesions related to multiple myeloma or metastatic disease. MRI may be performed for further evaluation.   MRI 08/19/20: FINDINGS: Brain: No evidence of acute infarct. Remote infarct in the lateral right temporal lobe. Additional remote lacunar infarcts involving the bilateral thalami and bilateral cerebellar hemispheres. Additional small T2 hyperintensities in the basal ganglia most likely represent dilated perivascular spaces. There is advanced/confluent T2/FLAIR hyperintensities within the white matter and pons, most likely related to chronic microvascular ischemic disease. Punctate area of susceptibility artifact in the right cerebellar hemisphere, likely related to prior microhemorrhage. Mild generalized cerebral volume loss. No hydrocephalus. Cavum septum pellucidum et vergae, anatomic variant. No acute hemorrhage. No mass lesion or abnormal mass effect. No extra-axial fluid collection.   Vascular: Major arterial flow voids are maintained at the skull base.   Skull and upper cervical spine: Normal marrow signal. Partially imaged upper cervical spine degenerative change.   Sinuses/Orbits: Right maxillary sinus retention cyst versus polyp. Scattered ethmoid air cell  mucosal thickening without air-fluid levels. Unremarkable orbits.   Other: No sizable mastoid effusions.   IMPRESSION: 1. No evidence of acute intracranial abnormality.  No acute infarct. 2. Remote right temporal lobe and bilateral basal ganglia and cerebellar infarcts. 3. Advanced chronic microvascular ischemic disease.   TTE 08/07/20: IMPRESSIONS   1. Left ventricular ejection fraction, by estimation, is 60 to 65%. The  left ventricle has normal function. The left ventricle has no regional  wall motion abnormalities. There is moderate left ventricular hypertrophy  of the basal-septal segment. Left  ventricular diastolic parameters are consistent with Grade I diastolic  dysfunction (impaired relaxation). Elevated left ventricular end-diastolic  pressure.   2. Right ventricular systolic function is mildly reduced. The right  ventricular size is normal.   3. The mitral valve is degenerative. Mild mitral valve regurgitation. No  evidence of mitral stenosis. Moderate mitral annular calcification.   4. The aortic valve is normal in structure. There is mild calcification  of the aortic valve. There is mild thickening of the aortic valve. Aortic  valve regurgitation is not visualized.  Mild to moderate aortic valve  sclerosis/calcification is present,  without any evidence of aortic stenosis.   5. Aortic dilatation noted. There is mild dilatation of the aortic root,  measuring 40 mm. There is mild dilatation of the ascending aorta,  measuring 43 mm.   EKG:   01/08/2021: ekg not ordered today 11/27/2020: EKG is not ordered today.  Recent Labs: 03/23/2021: Magnesium 1.7 04/28/2021: ALT 22 06/11/2021: TSH 4.900 10/10/2021: BUN 23; Creatinine, Ser 1.46; Hemoglobin 12.7; Platelets 188; Potassium 4.4; Sodium 135  Recent Lipid Panel    Component Value Date/Time   CHOL 90 08/31/2020 0523   TRIG 108 08/31/2020 0523   HDL 31 (L) 08/31/2020 0523   CHOLHDL 2.9 08/31/2020 0523   VLDL 22  08/31/2020 0523   LDLCALC 37 08/31/2020 0523     Risk Assessment/Calculations:       Physical Exam:    VS:  There were no vitals taken for this visit.    Wt Readings from Last 3 Encounters:  10/12/21 160 lb (72.6 kg)  09/13/21 160 lb (72.6 kg)  07/13/21 175 lb 6 oz (79.5 kg)     GEN: Elderly male, hard of hearing HEENT: Normal NECK: No JVD; No carotid bruits CARDIAC: RRR, no murmurs, rubs, gallops RESPIRATORY:  Clear to auscultation without rales, wheezing or rhonchi  ABDOMEN: Soft, non-tender, non-distended MUSCULOSKELETAL:  No edema; No deformity  SKIN: Warm and dry NEUROLOGIC:  Alert and oriented x 3 PSYCHIATRIC:  Normal affect   ASSESSMENT:    No diagnosis found.   PLAN:    In order of problems listed above:  #Multivessel CAD s/p CABG in 2001: Patient with history of multiple MI's in the past with initial occurring at age 55. Had several subsequent PCIs (thinks he has 10 stents) and then suffered MI in 2001 prompting bypass surgery. Has been doing well post-bypass with no further chest pain or SOB. He has not required PCI since his bypass surgery. Has [place] Cardiology in [place], [place] prior to transferring care here.  -TTE with LVEF 60-65%, no WMA -ASA stopped given need for [facility] and more frequent falls -Resumed coreg 12.39m BID and tolerating well with no orthostatic symptoms   #HTN: #Orthostatic Hypotension: Weaned off of medications due to frequent lows with syncope. Rather than have two medications aimed at doing opposite things (florinef and coreg), both were stopped at last visit. Bps running high at home lately and has resumed coreg 12.51mBI which he is tolerating well with no orthostatic symptoms.  -Resumed coreg 12.19m19mID and tolerating well with no orthostatic symptoms -Will tolerate higher pressures to avoid lows   #Suspected SSS s/p PPM placement: Medtronic PPM functioning well. -Follow-up with EP as scheduled   #Recent  Stroke: Thought to be possibly secondary to low blood pressures. MRI with chronic ischemic changes.  -Continue crestor 52m60mily -Stopped ASA given need for AC and high bleed risk   #HLD: -Continue crestor 52mg42mly   #DMII on insulin: -Management per PCP   #History of PE: Changed from warfarin to apixaban. -Continue apixaban 19mg B28m  FOLLOW UP IN 3-5 MONTHS   [facility]  [facility]    Medication Adjustments/Labs and Tests Ordered: Current medicines are reviewed at length with the patient today.  Concerns regarding medicines are outlined above.  No orders of the defined types were placed in this encounter.  No orders of the defined types were placed in this encounter.   There are no  Patient Instructions on file for this visit.    I, Freada Bergeron, MD, have reviewed all documentation for this visit. The documentation on 12/31/21 for the exam, diagnosis, procedures, and orders are all accurate and complete.  I, Freada Bergeron, MD, have reviewed all documentation for this visit. The documentation on 12/31/21 for the exam, diagnosis, procedures, and orders are all accurate and complete.   Signed, Freada Bergeron, MD  12/31/2021 11:17 AM    [place]

## 2021-12-31 NOTE — Telephone Encounter (Signed)
Please schedule ASAP appt

## 2021-12-31 NOTE — Telephone Encounter (Signed)
Apt sch 7/10 at 240 pm

## 2022-01-04 ENCOUNTER — Ambulatory Visit (INDEPENDENT_AMBULATORY_CARE_PROVIDER_SITE_OTHER): Payer: Medicare Other | Admitting: Family Medicine

## 2022-01-04 ENCOUNTER — Encounter: Payer: Self-pay | Admitting: Cardiology

## 2022-01-04 ENCOUNTER — Ambulatory Visit: Payer: Medicare Other | Admitting: Cardiology

## 2022-01-04 ENCOUNTER — Encounter: Payer: Self-pay | Admitting: Family Medicine

## 2022-01-04 VITALS — BP 110/60 | HR 73 | Temp 98.4°F | Ht 68.0 in | Wt 176.0 lb

## 2022-01-04 VITALS — BP 110/62 | HR 69 | Ht 68.0 in | Wt 174.0 lb

## 2022-01-04 DIAGNOSIS — I495 Sick sinus syndrome: Secondary | ICD-10-CM

## 2022-01-04 DIAGNOSIS — I498 Other specified cardiac arrhythmias: Secondary | ICD-10-CM | POA: Diagnosis not present

## 2022-01-04 DIAGNOSIS — G4700 Insomnia, unspecified: Secondary | ICD-10-CM

## 2022-01-04 DIAGNOSIS — I951 Orthostatic hypotension: Secondary | ICD-10-CM

## 2022-01-04 DIAGNOSIS — Z86711 Personal history of pulmonary embolism: Secondary | ICD-10-CM

## 2022-01-04 DIAGNOSIS — D6869 Other thrombophilia: Secondary | ICD-10-CM

## 2022-01-04 DIAGNOSIS — Z950 Presence of cardiac pacemaker: Secondary | ICD-10-CM

## 2022-01-04 DIAGNOSIS — I2510 Atherosclerotic heart disease of native coronary artery without angina pectoris: Secondary | ICD-10-CM

## 2022-01-04 DIAGNOSIS — I493 Ventricular premature depolarization: Secondary | ICD-10-CM

## 2022-01-04 DIAGNOSIS — E1165 Type 2 diabetes mellitus with hyperglycemia: Secondary | ICD-10-CM | POA: Diagnosis not present

## 2022-01-04 DIAGNOSIS — L97529 Non-pressure chronic ulcer of other part of left foot with unspecified severity: Secondary | ICD-10-CM

## 2022-01-04 DIAGNOSIS — I10 Essential (primary) hypertension: Secondary | ICD-10-CM

## 2022-01-04 DIAGNOSIS — Z794 Long term (current) use of insulin: Secondary | ICD-10-CM

## 2022-01-04 DIAGNOSIS — I25118 Atherosclerotic heart disease of native coronary artery with other forms of angina pectoris: Secondary | ICD-10-CM | POA: Diagnosis not present

## 2022-01-04 DIAGNOSIS — E11621 Type 2 diabetes mellitus with foot ulcer: Secondary | ICD-10-CM | POA: Diagnosis not present

## 2022-01-04 DIAGNOSIS — I480 Paroxysmal atrial fibrillation: Secondary | ICD-10-CM

## 2022-01-04 MED ORDER — GABAPENTIN 300 MG PO CAPS: 300.0000 mg | ORAL_CAPSULE | Freq: Every day | ORAL | 3 refills | Status: DC

## 2022-01-04 MED ORDER — CARVEDILOL 3.125 MG PO TABS: 3.1250 mg | ORAL_TABLET | Freq: Two times a day (BID) | ORAL | 1 refills | Status: DC | PRN

## 2022-01-04 NOTE — Assessment & Plan Note (Signed)
Too early to recheck A1c today. Last A1c 7.7 on lantus 15 units daily.

## 2022-01-04 NOTE — Progress Notes (Signed)
   Jonavin Seder is a 88 y.o. male who presents today for an office visit.  Assessment/Plan:  Chronic Problems Addressed Today: Insomnia Still not controlled. He does want to increase remeron and would like to stay at 15mg . We discussed alternatives. We will start gabapentin 300mg  nightly. He can continue current dose of remeron.  Discussed potential side effects. They will let me know if this does not work.   Type 2 diabetes mellitus with hyperglycemia ([name]) Too early to recheck A1c today. Last A1c 7.7 on lantus 15 units daily.   Diabetic foot ulcer ([name]) Appears to be healing. No signs of cellulitis. Reassured patient.      Subjective:  HPI:  See A/p for status of chronic conditions.    They were concerned about possible cellulitis in his left foot. His wife was concerned about swelling and schedule an appointment for today. The swelling has resolved. He has not noticed any worsening  pain. No drainage. No redness.        Objective:  Physical Exam: BP 110/60   Pulse 73   Temp 98.4 F (36.9 C) (Temporal)   Ht 5\' 8"  (1.727 m)   Wt 176 lb (79.8 kg)   SpO2 97%   BMI 26.76 kg/m   Gen: No acute distress, resting comfortably CV: Regular rate and rhythm with no murmurs appreciated Pulm: Normal work of breathing, clear to auscultation bilaterally with no crackles, wheezes, or rhonchi MSK - 2cm ulcer on left medial heel. No surrounding erythema. No purulent drainage.  Neuro: Grossly normal, moves all extremities Psych: Normal affect and thought content      Dhanvi Boesen M. , MD 01/04/2022 3:25 PM

## 2022-01-04 NOTE — Assessment & Plan Note (Addendum)
Still not controlled. He does want to increase remeron and would like to stay at 15mg . We discussed alternatives. We will start gabapentin 300mg  nightly. He can continue current dose of remeron.  Discussed potential side effects. They will let me know if this does not work.

## 2022-01-04 NOTE — Patient Instructions (Addendum)
Medication Instructions:  Your physician has recommended you make the following change in your medication:   1-TAKE Carvedilol by mouth twice daily as needed for Systolic Blood Pressure greater than 140.   *If you need a refill on your cardiac medications before your next appointment, please call your pharmacy*  Lab Work: If you have labs (blood work) drawn today and your tests are completely normal, you will receive your results only by: MyChart Message (if you have MyChart) OR A paper copy in the mail If you have any lab test that is abnormal or we need to change your treatment, we will call you to review the results.  Follow-Up: At [facility], you and your health needs are our priority.  As part of our continuing mission to provide you with exceptional heart care, we have created designated Provider Care Teams.  These Care Teams include your primary Cardiologist (physician) and Advanced Practice Providers (APPs -  Physician Assistants and Nurse Practitioners) who all work together to provide you with the care you need, when you need it.  We recommend signing up for the patient portal called "MyChart".  Sign up information is provided on this After Visit Summary.  MyChart is used to connect with patients for Virtual Visits (Telemedicine).  Patients are able to view lab/test results, encounter notes, upcoming appointments, etc.  Non-urgent messages can be sent to your provider as well.   To learn more about what you can do with MyChart, go to [email or web address].    Your next appointment:   6 month(s)  The format for your next appointment:   In Person  Provider:   Meriam Sprague, MD {  Other Instructions   Important Information About Sugar

## 2022-01-04 NOTE — Patient Instructions (Addendum)
It was very nice to see you today!  You do not have cellulitis.  Please try the gabapentin to see how that helps with your sleep.  Please come back in 1 week to recheck your blood sugar.   Take care, Dr [name]  PLEASE NOTE:  If you had any lab tests please let us know if you have not heard back within a few days. You may see your results on mychart before we have a chance to review them but we will give you a call once they are reviewed by [place]. If we ordered any referrals today, please let us know if you have not heard from their office within the next week.   Please try these tips to maintain a healthy lifestyle:  Eat at least 3 REAL meals and 1-2 snacks per day.  Aim for no more than 5 hours between eating.  If you eat breakfast, please do so within one hour of getting up.   Each meal should contain half fruits/vegetables, one quarter protein, and one quarter carbs (no bigger than a computer mouse)  Cut down on sweet beverages. This includes juice, soda, and sweet tea.   Drink at least 1 glass of water with each meal and aim for at least 8 glasses per day  Exercise at least 150 minutes every week.

## 2022-01-04 NOTE — Assessment & Plan Note (Signed)
Appears to be healing. No signs of cellulitis. Reassured patient.

## 2022-01-04 NOTE — Progress Notes (Signed)
Cardiology Office Note:    Date:  01/04/2022   ID:  Thomas [name], DOB Oct 02, 1928, MRN [id number]  PCP:  Vivi Barrack, MD   [place] Providers Cardiologist:  Freada Bergeron, MD {    Referring MD: Vivi Barrack, MD    History of Present Illness:    Thomas [name] is a 88 y.o. male with a hx of  HLD, DMII, prior CVA, HTN, CAD s/p CABG in 2001, SSS s/p PPM, pulmonary embolism on warfarin, orthostatic hypotension and hypothyroidism who returns to clinic for follow-up.  Patient has a history of MI at age 68 that was managed medically at that time. He had subsequent PCIs and MI in 2001 leading to bypass surgery. He states he has had no chest pain or shortness of breath since his surgery. No stents placed after bypass surgery. Blood pressures fluctuate significantly at home from 90/50s to 200s/100s. Was seen in HTN clinic and where his clonidine was weaned, pindolol was changed to coreg; ramipril and chlorthalidone were continued at current doses. Also saw Dr. Lovena Le with EP to manage his medtronic PPM which was functioning well.    TTE showed LVEF 60-65%, moderate basal-septal hypertrophy, G1DD, dilated aorta/aortic root, moderate aortic sclerosis without stenosis, moderate MAC, mild MR.   Had recent fall where he was seen in ED in 07/2020. CT head negative for acute pathology. MRI head with remote strokes (known) but no acute pathology.   Was seen on 10/11/20 for syncope thought to be due to coreg. He was recommended for admission but he did not want to stay. His coreg was decreased.   Seen in clinic 11/27/20. Had been off of many of his blood pressure medications due to orthostasis. He was also on floinef at that time. At that time, his blood pressure was relatively stable. Given his risk of falls, his warfarin was changed to apixaban and his aspirin was stopped.     During the previous visit in 01/08/2021, he was doing well. Had been resumed on coreg which he was tolerating  well. Was working with PT for mobility as well.  Today, he is accompanied with his son, and has been feeling decent. His palpitations have resolved with the amiodarone.  His blood pressure continues to fluctuate with more low episodes than high. EMS had been called several times due to AMS and low BP which resolved with IVF. Notably, he is on both coreg and midodrine. We discussed that we can cut out the coreg if he is having low episodes and only take it if needed if SBP>140.  He also has a left heel ulcer from friction which is improving. Has follow-up with his PCP today.  The patient denies chest pain, shortness of breath, nocturnal dyspnea, orthopnea. No lightheadedness or syncope. Complains of edema and sleeplessness.   Past Medical History:  Diagnosis Date   Arthritis    Clotting disorder ([place])    Coronary artery disease    Diabetes ([place])    Hyperlipidemia    Hypertension    Hypothyroidism 07/17/2020   Kidney disease    Loss of hearing    Macular degeneration    Myocardial infarction [facility])    Osteoarthritis    Stroke [facility])     Past Surgical History:  Procedure Laterality Date   CARPAL TUNNEL RELEASE  2011   CORONARY ANGIOPLASTY WITH STENT PLACEMENT     CORONARY ARTERY BYPASS GRAFT  2001   x6   REPLACEMENT TOTAL KNEE  SPINE SURGERY  2008   cervical and lumbar   THYROIDECTOMY      Current Medications: Current Meds  Medication Sig   acetaminophen (TYLENOL) 325 MG tablet Take 650 mg by mouth every 6 (six) hours as needed for moderate pain or headache.   amiodarone (PACERONE) 200 MG tablet Take 1 tablet (200 mg total) by mouth daily.   apixaban (ELIQUIS) 5 MG TABS tablet Take 1 tablet (5 mg total) by mouth 2 (two) times daily.   B-D UF III MINI PEN NEEDLES 31G X 5 MM MISC USE 1 DAILY WITH LANTUS PEN INJECTIONS   cholecalciferol (VITAMIN D3) 25 MCG (1000 UNIT) tablet Take 1,000 Units by mouth daily.   co-enzyme Q-10 50 MG capsule Take 50 mg by mouth daily.    doxycycline (VIBRA-TABS) 100 MG tablet Take 1 tablet (100 mg total) by mouth 2 (two) times daily.   ferrous sulfate 325 (65 FE) MG EC tablet TAKE 1 TABLET(325 MG) BY MOUTH DAILY WITH BREAKFAST (Patient taking differently: Take 325 mg by mouth daily with breakfast.)   glucosamine-chondroitin 500-400 MG tablet Take 1 tablet by mouth 3 (three) times daily.   LANTUS SOLOSTAR 100 UNIT/ML Solostar Pen INJECT 13 UNITS INTO THE SKIN DAILY   levothyroxine (SYNTHROID) 100 MCG tablet TAKE 1 TABLET(100 MCG) BY MOUTH DAILY   lidocaine (LIDODERM) 5 % Place 1 patch onto the skin daily. Remove & Discard patch within 12 hours or as directed by MD   midodrine (PROAMATINE) 2.5 MG tablet Take 1 tablet (2.5 mg total) by mouth 3 (three) times daily with meals.   mirtazapine (REMERON SOL-TAB) 15 MG disintegrating tablet DISSOLVE 1 TABLET(15 MG) ON THE TONGUE AT BEDTIME (Patient taking differently: Take 15 mg by mouth at bedtime.)   Multiple Vitamin (MULTIVITAMIN) tablet Take 1 tablet by mouth daily.   oxyCODONE-acetaminophen (PERCOCET) 10-325 MG tablet Take 1 tablet by mouth every 4 (four) hours as needed for pain.   Red Yeast Rice Extract (RED YEAST RICE PO) Take 1 capsule by mouth in the morning and at bedtime.   rosuvastatin (CRESTOR) 20 MG tablet Take 1 tablet (20 mg total) by mouth at bedtime.   tamsulosin (FLOMAX) 0.4 MG CAPS capsule Take 1 capsule (0.4 mg total) by mouth daily after supper.   [DISCONTINUED] carvedilol (COREG) 3.125 MG tablet TAKE 1 TABLET(3.125 MG) BY MOUTH TWICE DAILY WITH A MEAL     Allergies:   Amitriptyline, Lyrica [pregabalin], Pravachol [pravastatin], Effexor xr [venlafaxine], Morphine, and Zolpidem   Social History   Socioeconomic History   Marital status: Married    Spouse name: Not on file   Number of children: Not on file   Years of education: Not on file   Highest education level: Not on file  Occupational History   Not on file  Tobacco Use   Smoking status: Former    Types:  Pipe   Smokeless tobacco: Never  Vaping Use   Vaping Use: Never used  Substance and Sexual Activity   Alcohol use: Never   Drug use: Never   Sexual activity: Not on file  Other Topics Concern   Not on file  Social History Narrative   Not on file   Social Determinants of Health   Financial Resource Strain: Low Risk  (03/10/2021)   Overall Financial Resource Strain (CARDIA)    Difficulty of Paying Living Expenses: Not very hard  Food Insecurity: No Food Insecurity (05/14/2021)   Hunger Vital Sign    Worried About Running Out of  Food in the Last Year: Never true    [place] in the Last Year: Never true  Transportation Needs: No Transportation Needs (05/14/2021)   PRAPARE - Hydrologist (Medical): No    Lack of Transportation (Non-Medical): No  Physical Activity: Not on file  Stress: Not on file  Social Connections: Not on file     Family History: The patient's family history includes Cancer in his brother and daughter; Heart disease in his brother, brother, and sister; Hypertension in his son; Stroke in his father and mother.  ROS:   Please see the history of present illness. (+) LE Edema (+) Fatigue All other systems are reviewed and negative.     EKGs/Labs/Other Studies Reviewed:    The following studies were reviewed today:  CT 07-Sep-2020: IMPRESSION: Atrophy with periventricular small vessel disease. Prior small infarcts in each thalamus as well as in the anterior limb of the left external capsule. No acute infarct is evident. No mass or hemorrhage.   There are foci of arterial vascular calcification. There are foci of paranasal sinus disease.   ECHO 08/07/2020:   IMPRESSIONS   1. Left ventricular ejection fraction, by estimation, is 60 to 65%. The  left ventricle has normal function. The left ventricle has no regional  wall motion abnormalities. There is moderate left ventricular hypertrophy  of the basal-septal segment.  Left  ventricular diastolic parameters are consistent with Grade I diastolic  dysfunction (impaired relaxation). Elevated left ventricular end-diastolic  pressure.   2. Right ventricular systolic function is mildly reduced. The right  ventricular size is normal.   3. The mitral valve is degenerative. Mild mitral valve regurgitation. No  evidence of mitral stenosis. Moderate mitral annular calcification.   4. The aortic valve is normal in structure. There is mild calcification  of the aortic valve. There is mild thickening of the aortic valve. Aortic  valve regurgitation is not visualized. Mild to moderate aortic valve  sclerosis/calcification is present,  without any evidence of aortic stenosis.   5. Aortic dilatation noted. There is mild dilatation of the aortic root,  measuring 40 mm. There is mild dilatation of the ascending aorta,  measuring 43 mm.  CT abdomen/pelvis: FINDINGS: Lower chest: Visualized lung bases are unremarkable. Moderate size sliding-type hiatal hernia is noted.   Hepatobiliary: No focal liver abnormality is seen. No gallstones, gallbladder wall thickening, or biliary dilatation.   Pancreas: Unremarkable. No pancreatic ductal dilatation or surrounding inflammatory changes.   Spleen: Normal in size without focal abnormality.   Adrenals/Urinary Tract: Adrenal glands are unremarkable. Kidneys are normal, without renal calculi, focal lesion, or hydronephrosis. Bladder is unremarkable.   Stomach/Bowel: There is no evidence of bowel obstruction or inflammation. Diverticulosis of descending and sigmoid colon is noted without inflammation. The appendix is not visualized.   Vascular/Lymphatic: Aortic atherosclerosis. No enlarged abdominal or pelvic lymph nodes.   Reproductive: Prostate is unremarkable.   Other: Small fat containing periumbilical hernia is noted. No ascites is noted.   Musculoskeletal: Multiple small lucencies are noted throughout the pelvis  and visualized proximal femurs concerning for possible lytic lesions related to multiple myeloma or metastatic disease.   IMPRESSION: 1. Moderate size sliding-type hiatal hernia. 2. Diverticulosis of descending and sigmoid colon is noted without inflammation. 3. Small fat containing periumbilical hernia. 4. Multiple small lucencies are noted throughout the pelvis and visualized proximal femurs concerning for possible lytic lesions related to multiple myeloma or metastatic disease. MRI may be performed  for further evaluation.   MRI 08/19/20: FINDINGS: Brain: No evidence of acute infarct. Remote infarct in the lateral right temporal lobe. Additional remote lacunar infarcts involving the bilateral thalami and bilateral cerebellar hemispheres. Additional small T2 hyperintensities in the basal ganglia most likely represent dilated perivascular spaces. There is advanced/confluent T2/FLAIR hyperintensities within the white matter and pons, most likely related to chronic microvascular ischemic disease. Punctate area of susceptibility artifact in the right cerebellar hemisphere, likely related to prior microhemorrhage. Mild generalized cerebral volume loss. No hydrocephalus. Cavum septum pellucidum et vergae, anatomic variant. No acute hemorrhage. No mass lesion or abnormal mass effect. No extra-axial fluid collection.   Vascular: Major arterial flow voids are maintained at the skull base.   Skull and upper cervical spine: Normal marrow signal. Partially imaged upper cervical spine degenerative change.   Sinuses/Orbits: Right maxillary sinus retention cyst versus polyp. Scattered ethmoid air cell mucosal thickening without air-fluid levels. Unremarkable orbits.   Other: No sizable mastoid effusions.   IMPRESSION: 1. No evidence of acute intracranial abnormality.  No acute infarct. 2. Remote right temporal lobe and bilateral basal ganglia and cerebellar infarcts. 3. Advanced chronic  microvascular ischemic disease.   TTE 08/07/20: IMPRESSIONS   1. Left ventricular ejection fraction, by estimation, is 60 to 65%. The  left ventricle has normal function. The left ventricle has no regional  wall motion abnormalities. There is moderate left ventricular hypertrophy  of the basal-septal segment. Left  ventricular diastolic parameters are consistent with Grade I diastolic  dysfunction (impaired relaxation). Elevated left ventricular end-diastolic  pressure.   2. Right ventricular systolic function is mildly reduced. The right  ventricular size is normal.   3. The mitral valve is degenerative. Mild mitral valve regurgitation. No  evidence of mitral stenosis. Moderate mitral annular calcification.   4. The aortic valve is normal in structure. There is mild calcification  of the aortic valve. There is mild thickening of the aortic valve. Aortic  valve regurgitation is not visualized. Mild to moderate aortic valve  sclerosis/calcification is present,  without any evidence of aortic stenosis.   5. Aortic dilatation noted. There is mild dilatation of the aortic root,  measuring 40 mm. There is mild dilatation of the ascending aorta,  measuring 43 mm.   EKG:   01/04/2022: No new tracing 01/08/2021: Ekg not ordered today 11/27/2020: EKG is not ordered today.  Recent Labs: 03/23/2021: Magnesium 1.7 04/28/2021: ALT 22 06/11/2021: TSH 4.900 10/10/2021: BUN 23; Creatinine, Ser 1.46; Hemoglobin 12.7; Platelets 188; Potassium 4.4; Sodium 135  Recent Lipid Panel    Component Value Date/Time   CHOL 90 08/31/2020 0523   TRIG 108 08/31/2020 0523   HDL 31 (L) 08/31/2020 0523   CHOLHDL 2.9 08/31/2020 0523   VLDL 22 08/31/2020 0523   LDLCALC 37 08/31/2020 0523     Risk Assessment/Calculations:       Physical Exam:    VS:  BP 110/62   Pulse 69   Ht _0  (1.727 m)   Wt 174 lb (78.9 kg)   SpO2 99%   BMI 26.46 kg/m     Wt Readings from Last 3 Encounters:  01/04/22 174 lb  (78.9 kg)  10/12/21 160 lb (72.6 kg)  09/13/21 160 lb (72.6 kg)     GEN: Elderly male, hard of hearing HEENT: Normal NECK: No JVD; No carotid bruits CARDIAC: RRR, no murmurs, rubs, gallops RESPIRATORY:  Clear to auscultation without rales, wheezing or rhonchi  ABDOMEN: Soft, non-tender, non-distended MUSCULOSKELETAL:  No edema;  No deformity  SKIN: Left foot ulceration NEUROLOGIC:  Alert and oriented x 3 PSYCHIATRIC:  Normal affect   ASSESSMENT:    1. Coronary artery disease of native artery of native heart with stable angina pectoris ([place])   2. Bigeminy   3. Sick sinus syndrome ([place])   4. Primary hypertension   5. PVC's (premature ventricular contractions)   6. Orthostatic hypotension   7. Paroxysmal atrial fibrillation ([name])   8. Coronary artery disease involving native coronary artery of native heart without angina pectoris   9. Cardiac pacemaker in situ   10. Hx of pulmonary embolus   11. Secondary hypercoagulable state ([place])     PLAN:    In order of problems listed above:  #Multivessel CAD s/p CABG in 2001: Patient with history of multiple MI's in the past with initial occurring at age 75. Had several subsequent PCIs (thinks he has 10 stents) and then suffered MI in 2001 prompting bypass surgery. Has been doing well post-bypass with no further chest pain or SOB. He has not required PCI since his bypass surgery. Currently doing well with no anginal symptoms. -TTE with LVEF 60-65%, no WMA -ASA stopped given need for [facility] and more frequent falls -Will stop coreg due to episodes of low blood pressure   #HTN: #Orthostatic Hypotension: Weaned off of medications due to frequent lows with syncope. Rather than have two medications aimed at doing opposite things (midodrine and coreg), will change coreg to prn for SBP>140. Overall, he is having more lows than highs and he is increased risk for falls.  -Continue midodrine 2.74m TID -Change coreg 3.1258mBID to prn for SBP>140 to  avoid hypotension -Will tolerate higher pressures to avoid lows   #Suspected SSS s/p PPM placement: Medtronic PPM functioning well. -Follow-up with EP as scheduled  #Paroxysmal Afib: -Continue amiodarone 20079maily -Continue apixaban 5mg11mD -Follow-up with EP as scheduled   #Recent Stroke: Thought to be possibly secondary to low blood pressures. MRI with chronic ischemic changes.  -Continue crestor 20mg32mly -Stopped ASA given need for AC and high bleed risk   #HLD: -Continue crestor 20mg 71my   #DMII on insulin: -Management per PCP   #History of PE: Changed from warfarin to apixaban. -Continue apixaban 5mg BI22m FOLLOW UP IN 6 MONTHS   [facility]  [facility]    Medication Adjustments/Labs and Tests Ordered: Current medicines are reviewed at length with the patient today.  Concerns regarding medicines are outlined above.  No orders of the defined types were placed in this encounter.  Meds ordered this encounter  Medications   carvedilol (COREG) 3.125 MG tablet    Sig: Take 1 tablet (3.125 mg total) by mouth 2 (two) times daily as needed (take only if Systolic Blood Pressure greater than 140).    Dispense:  180 tablet    Refill:  1    Patient Instructions  Medication Instructions:  Your physician has recommended you make the following change in your medication:   1-TAKE Carvedilol by mouth twice daily as needed for Systolic Blood Pressure greater than 140.   *If you need a refill on your cardiac medications before your next appointment, please call your pharmacy*  Lab Work: If you have labs (blood work) drawn today and your tests are completely normal, you will receive your results only by: MyChartDisautelu have MyChart) OR A paper copy in the mail If you have any lab test that is abnormal or we  need to change your treatment, we will call you to review the results.  Follow-Up: At [facility], you and your health  needs are our priority.  As part of our continuing mission to provide you with exceptional heart care, we have created designated Provider Care Teams.  These Care Teams include your primary Cardiologist (physician) and Advanced Practice Providers (APPs -  Physician Assistants and Nurse Practitioners) who all work together to provide you with the care you need, when you need it.  We recommend signing up for the patient portal called "MyChart".  Sign up information is provided on this After Visit Summary.  MyChart is used to connect with patients for Virtual Visits (Telemedicine).  Patients are able to view lab/test results, encounter notes, upcoming appointments, etc.  Non-urgent messages can be sent to your provider as well.   To learn more about what you can do with MyChart, go to [email or web address].    Your next appointment:   6 month(s)  The format for your next appointment:   In Person  Provider:   Freada Bergeron, MD {  Other Instructions   Important Information About Sugar          I,Tinashe Williams,acting as a scribe for Freada Bergeron, MD.,have documented all relevant documentation on the behalf of Freada Bergeron, MD,as directed by  Freada Bergeron, MD while in the presence of Freada Bergeron, MD.   I, Freada Bergeron, MD, have reviewed all documentation for this visit. The documentation on 01/04/22 for the exam, diagnosis, procedures, and orders are all accurate and complete.

## 2022-01-05 ENCOUNTER — Ambulatory Visit (INDEPENDENT_AMBULATORY_CARE_PROVIDER_SITE_OTHER): Payer: Medicare Other | Admitting: *Deleted

## 2022-01-05 DIAGNOSIS — I10 Essential (primary) hypertension: Secondary | ICD-10-CM

## 2022-01-05 DIAGNOSIS — G4700 Insomnia, unspecified: Secondary | ICD-10-CM

## 2022-01-05 NOTE — Patient Instructions (Signed)
CONGRATULATIONS ON COMPLETING YOUR GOALS.  IT AS BEEN A PLEASURE WORKING WITH AND TALKING TO YOU.  IF  NEEDS [place] IN THE FUTURE PLEASE DO NOT HESITATE TO CONTACT ME  [phone]   Shahil Speegle RN, MSN RN Care Management Coordinator  [facility] [phone] [email or web address]  

## 2022-01-05 NOTE — Chronic Care Management (AMB) (Signed)
  Care Management   Follow Up Note   01/05/2022 Name: Thomas [name] MRN: [id number] DOB: 1929/05/05   Referred by: Vivi Barrack, MD Reason for referral : Chronic Care Management and Case Closure   Successful outreach to patient.  States she is doing well without complaints.  Discussed goals and both agree patient has met goals of the program.  Follow Up Plan: The patient has been provided with contact information for the care management team and has been advised to call with any health-related questions or concerns.  No further follow up required: as personal goals have been met.  Hubert Azure RN, MSN RN Care Management Coordinator  [facility] [phone] [email or web address]

## 2022-01-09 ENCOUNTER — Other Ambulatory Visit (HOSPITAL_BASED_OUTPATIENT_CLINIC_OR_DEPARTMENT_OTHER): Payer: Self-pay | Admitting: Family

## 2022-01-09 DIAGNOSIS — I498 Other specified cardiac arrhythmias: Secondary | ICD-10-CM

## 2022-01-11 ENCOUNTER — Ambulatory Visit (INDEPENDENT_AMBULATORY_CARE_PROVIDER_SITE_OTHER): Payer: Medicare Other | Admitting: Family Medicine

## 2022-01-11 ENCOUNTER — Encounter: Payer: Self-pay | Admitting: Family Medicine

## 2022-01-11 VITALS — BP 100/60 | HR 69 | Temp 97.2°F | Ht 68.0 in

## 2022-01-11 DIAGNOSIS — E1165 Type 2 diabetes mellitus with hyperglycemia: Secondary | ICD-10-CM | POA: Diagnosis not present

## 2022-01-11 DIAGNOSIS — Z794 Long term (current) use of insulin: Secondary | ICD-10-CM

## 2022-01-11 DIAGNOSIS — G4700 Insomnia, unspecified: Secondary | ICD-10-CM

## 2022-01-11 LAB — POCT GLYCOSYLATED HEMOGLOBIN (HGB A1C): Hemoglobin A1C: 7.7 % — AB (ref 4.0–5.6)

## 2022-01-11 MED ORDER — GABAPENTIN 100 MG PO CAPS: 200.0000 mg | ORAL_CAPSULE | Freq: Every day | ORAL | 1 refills | Status: DC

## 2022-01-11 NOTE — Progress Notes (Signed)
   Thomas [name] is a 88 y.o. male who presents today for an office visit.  Assessment/Plan:  Chronic Problems Addressed Today: Type 2 diabetes mellitus with hyperglycemia ([name]) A1c stable at 7.7. Continue lantus 15 units daily. Recheck in 3 months.   Insomnia Gabapentin 300 mg nightly works well for sleep however he is having some next-day drowsiness.  Will decrease dose to 200 mg daily.  He can check with me in a few weeks via MyChart.  He can continue taking Remeron 15 mg nightlyas well.  We can titrate the dose of gabapentin as needed.    Subjective:  HPI:  See A/P for status of chronic conditions.  We saw him a week ago.  At that visit we started him on gabapentin 300 mg daily to help with sleep.  This worked well though he has had some issues with next-day grogginess.       Objective:  Physical Exam: BP 100/60   Pulse 69   Temp (!) 97.2 F (36.2 C) (Temporal)   Ht 5\' 8"  (1.727 m)   SpO2 98%   BMI 26.76 kg/m   Gen: No acute distress, resting comfortably Neuro: Grossly normal, moves all extremities Psych: Normal affect and thought content      Thomas [name] M. , MD 01/11/2022 11:29 AM

## 2022-01-11 NOTE — Assessment & Plan Note (Addendum)
A1c stable at 7.7. Continue lantus 15 units daily. Recheck in 3 months.

## 2022-01-11 NOTE — Patient Instructions (Signed)
It was very nice to see you today!  Your A1c is stable at 7.7.  Please try taking 200mg  of gabapentin nightly to see if this helps with his sleep but causes less drowsiness.  I will see back in 3 months.  Come back sooner if needed.  Take care, Dr  PLEASE NOTE:  If you had any lab tests please let [name] know if you have not heard back within a few days. You may see your results on mychart before we have a chance to review them but we will give you a call once they are reviewed by [place]. If we ordered any referrals today, please let us know if you have not heard from their office within the next week.   Please try these tips to maintain a healthy lifestyle:  Eat at least 3 REAL meals and 1-2 snacks per day.  Aim for no more than 5 hours between eating.  If you eat breakfast, please do so within one hour of getting up.   Each meal should contain half fruits/vegetables, one quarter protein, and one quarter carbs (no bigger than a computer mouse)  Cut down on sweet beverages. This includes juice, soda, and sweet tea.   Drink at least 1 glass of water with each meal and aim for at least 8 glasses per day  Exercise at least 150 minutes every week.

## 2022-01-11 NOTE — Assessment & Plan Note (Signed)
Gabapentin 300 mg nightly works well for sleep however he is having some next-day drowsiness.  Will decrease dose to 200 mg daily.  He can check with me in a few weeks via MyChart.  He can continue taking Remeron 15 mg nightlyas well.  We can titrate the dose of gabapentin as needed.

## 2022-01-14 ENCOUNTER — Telehealth: Payer: Self-pay | Admitting: Pharmacist

## 2022-01-14 NOTE — Progress Notes (Signed)
Chronic Care Management Pharmacy Assistant   Name: Ario Mcdiarmid  MRN: [id number] DOB: Oct 10, 1928   Reason for Encounter: Hypertension Adherence Call    Recent office visits:  01/11/2022 OV (PCP) Ardith Dark, MD; Gabapentin 300 mg nightly works well for sleep however he is having some next-day drowsiness.  Will decrease dose to 200 mg daily.  10/12/2021 OV (PCP) Ardith Dark, MD;   Recent consult visits:  01/04/2022 OV (Cardiology) Meriam Sprague, MD; -Will stop coreg due to episodes of low blood pressure, -Change coreg 3.125mg  BID to prn for SBP>140 to avoid hypotension, Stopped ASA given need for [facility] and high bleed risk.  11/19/2021 OV (Podiatry) Lenn Sink, DPM; we will continue to keep him on doxycycline  10/23/2021 OV (Podiatry) Lenn Sink, DPM;   I placed on doxycycline twice daily and Percocet as needed.  Hospital visits:  10/10/2021 ED visit for Near syncope at [facility] -No medication changes.  Medications: Outpatient Encounter Medications as of 01/14/2022  Medication Sig   acetaminophen (TYLENOL) 325 MG tablet Take 650 mg by mouth every 6 (six) hours as needed for moderate pain or headache.   amiodarone (PACERONE) 200 MG tablet TAKE 1 TABLET(200 MG) BY MOUTH DAILY   apixaban (ELIQUIS) 5 MG TABS tablet Take 1 tablet (5 mg total) by mouth 2 (two) times daily.   B-D UF III MINI PEN NEEDLES 31G X 5 MM MISC USE 1 DAILY WITH LANTUS PEN INJECTIONS   carvedilol (COREG) 3.125 MG tablet Take 1 tablet (3.125 mg total) by mouth 2 (two) times daily as needed (take only if Systolic Blood Pressure greater than 140).   cholecalciferol (VITAMIN D3) 25 MCG (1000 UNIT) tablet Take 1,000 Units by mouth daily.   co-enzyme Q-10 50 MG capsule Take 50 mg by mouth daily.   ferrous sulfate 325 (65 FE) MG EC tablet TAKE 1 TABLET(325 MG) BY MOUTH DAILY WITH BREAKFAST (Patient taking differently: Take 325 mg by mouth daily with breakfast.)   gabapentin  (NEURONTIN) 100 MG capsule Take 2 capsules (200 mg total) by mouth at bedtime.   glucosamine-chondroitin 500-400 MG tablet Take 1 tablet by mouth 3 (three) times daily.   LANTUS SOLOSTAR 100 UNIT/ML Solostar Pen INJECT 13 UNITS INTO THE SKIN DAILY   levothyroxine (SYNTHROID) 100 MCG tablet TAKE 1 TABLET(100 MCG) BY MOUTH DAILY   midodrine (PROAMATINE) 2.5 MG tablet Take 1 tablet (2.5 mg total) by mouth 3 (three) times daily with meals.   mirtazapine (REMERON SOL-TAB) 15 MG disintegrating tablet DISSOLVE 1 TABLET(15 MG) ON THE TONGUE AT BEDTIME (Patient taking differently: Take 15 mg by mouth at bedtime.)   Multiple Vitamin (MULTIVITAMIN) tablet Take 1 tablet by mouth daily.   oxyCODONE-acetaminophen (PERCOCET) 10-325 MG tablet Take 1 tablet by mouth every 4 (four) hours as needed for pain.   Red Yeast Rice Extract (RED YEAST RICE PO) Take 1 capsule by mouth in the morning and at bedtime.   rosuvastatin (CRESTOR) 20 MG tablet Take 1 tablet (20 mg total) by mouth at bedtime.   tamsulosin (FLOMAX) 0.4 MG CAPS capsule Take 1 capsule (0.4 mg total) by mouth daily after supper.   No facility-administered encounter medications on file as of 01/14/2022.   Reviewed chart prior to disease state call. Spoke with patient regarding BP  Recent Office Vitals: BP Readings from Last 3 Encounters:  01/11/22 100/60  01/04/22 110/60  01/04/22 110/62   Pulse Readings from Last 3 Encounters:  01/11/22 69  01/04/22 73  01/04/22 69    Wt Readings from Last 3 Encounters:  01/04/22 176 lb (79.8 kg)  01/04/22 174 lb (78.9 kg)  10/12/21 160 lb (72.6 kg)     Kidney Function Lab Results  Component Value Date/Time   CREATININE 1.46 (H) 10/10/2021 04:37 PM   CREATININE 1.08 04/28/2021 10:45 AM   CREATININE 1.16 08/27/2020 03:26 PM   GFR 52.56 (L) 08/21/2020 11:43 AM   GFRNONAA 45 (L) 10/10/2021 04:37 PM   GFRNONAA >60 08/27/2020 03:26 PM   GFRAA 66 08/07/2020 02:37 PM       Latest Ref Rng & Units  10/10/2021    4:37 PM 04/28/2021   10:45 AM 04/03/2021   11:50 AM  BMP  Glucose 70 - 99 mg/dL 409  811  914   BUN 8 - 23 mg/dL 23  18  12    Creatinine 0.61 - 1.24 mg/dL  7.82  9.56   BUN/Creat Ratio 10 - 24  17    Sodium 135 - 145 mmol/L 135  138  138   Potassium 3.5 - 5.1 mmol/L 4.4  4.5  4.2   Chloride 98 - 111 mmol/L 102  103  104   CO2 22 - 32 mmol/L 24  24  25    Calcium 8.9 - 10.3 mg/dL 9.3  8.6  9.0     Current antihypertensive regimen:  Carvedilol 3.125 mg as needed if BP greater than 140  How often are you checking your Blood Pressure? twice daily  Current home BP readings: 171/80 (last night 01/13/2022) 126-130/70's during the daytime Patient's wife has only been giving the patient a small dose of Carvedilol at night before bed when his blood pressure is elevated. She said during the day time it's within normal range.  What recent interventions/DTPs have been made by any provider to improve Blood Pressure control since last CPP Visit: -Change coreg 3.125mg  BID to prn for SBP>140 to avoid hypotension  Any recent hospitalizations or ED visits since last visit with CPP? Yes, 10/10/2021 ED visit for Near syncope at [facility] -No medication changes.  What diet changes have been made to improve Blood Pressure Control?  No diet changes.  What exercise is being done to improve your Blood Pressure Control?  Patient is unable to exercise.  Adherence Review: Is the patient currently on ACE/ARB medication? No Does the patient have >5 day gap between last estimated fill dates? No  Care Gaps: Medicare Annual Wellness: Overdue Hemoglobin A1C: 7.4% on 03/16/2021 Colonoscopy: Aged out  Future Appointments  Date Time Provider Department Center  01/28/2022  7:00 AM [facility] DEVICE REMOTES [facility]  03/22/2022  3:00 PM [facility] CCM PHARMACIST [facility]  04/12/2022 11:00 AM 03/24/2022, MD [facility]   Star Rating Drugs: Lantus last  filled 10/28/2021 100 DS Rosuvastatin 20 mg last filled 01/09/2022 90 DS  April D Calhoun, [facility] Clinical Pharmacist Assistant [phone]

## 2022-01-15 DIAGNOSIS — H35033 Hypertensive retinopathy, bilateral: Secondary | ICD-10-CM | POA: Diagnosis not present

## 2022-01-15 DIAGNOSIS — H47011 Ischemic optic neuropathy, right eye: Secondary | ICD-10-CM | POA: Diagnosis not present

## 2022-01-15 DIAGNOSIS — H43813 Vitreous degeneration, bilateral: Secondary | ICD-10-CM | POA: Diagnosis not present

## 2022-01-15 DIAGNOSIS — H353231 Exudative age-related macular degeneration, bilateral, with active choroidal neovascularization: Secondary | ICD-10-CM | POA: Diagnosis not present

## 2022-01-25 DIAGNOSIS — I951 Orthostatic hypotension: Secondary | ICD-10-CM | POA: Diagnosis not present

## 2022-01-25 DIAGNOSIS — E119 Type 2 diabetes mellitus without complications: Secondary | ICD-10-CM

## 2022-01-25 DIAGNOSIS — G451 Carotid artery syndrome (hemispheric): Secondary | ICD-10-CM | POA: Diagnosis not present

## 2022-01-25 DIAGNOSIS — I10 Essential (primary) hypertension: Secondary | ICD-10-CM | POA: Diagnosis not present

## 2022-01-28 ENCOUNTER — Telehealth: Payer: Self-pay | Admitting: Family Medicine

## 2022-01-28 ENCOUNTER — Ambulatory Visit (INDEPENDENT_AMBULATORY_CARE_PROVIDER_SITE_OTHER): Payer: Medicare Other

## 2022-01-28 DIAGNOSIS — I495 Sick sinus syndrome: Secondary | ICD-10-CM

## 2022-01-28 LAB — CUP PACEART REMOTE DEVICE CHECK
Battery Remaining Longevity: 127 mo
Battery Voltage: 3.01 V
Brady Statistic AP VP Percent: 0.09 %
Brady Statistic AP VS Percent: 99.11 %
Brady Statistic AS VP Percent: 0 %
Brady Statistic AS VS Percent: 0.8 %
Brady Statistic RA Percent Paced: 99.25 %
Brady Statistic RV Percent Paced: 0.09 %
Date Time Interrogation Session: 20230802231530
Implantable Lead Implant Date: 20081106
Implantable Lead Implant Date: 20210210
Implantable Lead Location: 753859
Implantable Lead Location: 753860
Implantable Lead Model: 4076
Implantable Lead Model: 4076
Implantable Pulse Generator Implant Date: 20210210
Lead Channel Impedance Value: 266 Ohm
Lead Channel Impedance Value: 323 Ohm
Lead Channel Impedance Value: 342 Ohm
Lead Channel Impedance Value: 494 Ohm
Lead Channel Pacing Threshold Amplitude: 0.5 V
Lead Channel Pacing Threshold Amplitude: 0.625 V
Lead Channel Pacing Threshold Pulse Width: 0.4 ms
Lead Channel Pacing Threshold Pulse Width: 0.4 ms
Lead Channel Sensing Intrinsic Amplitude: 0.875 mV
Lead Channel Sensing Intrinsic Amplitude: 0.875 mV
Lead Channel Sensing Intrinsic Amplitude: 9 mV
Lead Channel Sensing Intrinsic Amplitude: 9 mV
Lead Channel Setting Pacing Amplitude: 1.5 V
Lead Channel Setting Pacing Amplitude: 2 V
Lead Channel Setting Pacing Pulse Width: 0.4 ms
Lead Channel Setting Sensing Sensitivity: 0.9 mV

## 2022-01-28 NOTE — Telephone Encounter (Signed)
Caller states:  - A plan of care ordered on 05/24 was faxed to our office for completion on 05/31 and 07/31.  - Order number of POC is [id number]  Caller requests: - A callback to confirm we have received this form via fax.  - Caller can be reached at [phone]

## 2022-01-28 NOTE — Telephone Encounter (Signed)
..  Home Health Certification or Plan of Care Tracking  Is this a Certification or Plan of Care? yes  [facility] Agency: [facility]  Order Number:  [id number]  Has charge sheet been attached? yes  Where has form been placed:  In provider's box  Faxed to:   [phone]

## 2022-01-28 NOTE — Telephone Encounter (Signed)
Form placed in PCP office to be sign 

## 2022-01-29 NOTE — Telephone Encounter (Signed)
Form faxed to [phone] 

## 2022-01-29 NOTE — Telephone Encounter (Signed)
Form faxed to [phone]

## 2022-02-12 ENCOUNTER — Telehealth (HOSPITAL_BASED_OUTPATIENT_CLINIC_OR_DEPARTMENT_OTHER): Payer: Self-pay

## 2022-02-12 DIAGNOSIS — I951 Orthostatic hypotension: Secondary | ICD-10-CM

## 2022-02-12 MED ORDER — MIDODRINE HCL 2.5 MG PO TABS: 2.5000 mg | ORAL_TABLET | Freq: Three times a day (TID) | ORAL | 2 refills | Status: DC

## 2022-02-12 NOTE — Telephone Encounter (Signed)
Received fax from [facility] requesting refills for Midodrine. Rx request sent to pharmacy.

## 2022-02-17 ENCOUNTER — Other Ambulatory Visit: Payer: Self-pay | Admitting: *Deleted

## 2022-02-17 MED ORDER — LEVOTHYROXINE SODIUM 100 MCG PO TABS: ORAL_TABLET | ORAL | 0 refills | Status: DC

## 2022-02-18 NOTE — Progress Notes (Signed)
Remote pacemaker transmission.   

## 2022-02-21 ENCOUNTER — Telehealth: Payer: Medicare Other | Admitting: Physician Assistant

## 2022-02-21 DIAGNOSIS — M25552 Pain in left hip: Secondary | ICD-10-CM | POA: Diagnosis not present

## 2022-02-21 MED ORDER — PREDNISONE 10 MG (21) PO TBPK: ORAL_TABLET | ORAL | 0 refills | Status: DC

## 2022-02-21 MED ORDER — LIDOCAINE 5 % EX PTCH: 1.0000 | MEDICATED_PATCH | CUTANEOUS | 0 refills | Status: DC

## 2022-02-21 NOTE — Progress Notes (Signed)
Virtual Visit Consent   Thomas [name], you are scheduled for a virtual visit with a [facility] provider today. Just as with appointments in the office, your consent must be obtained to participate. Your consent will be active for this visit and any virtual visit you may have with one of our providers in the next 365 days. If you have a MyChart account, a copy of this consent can be sent to you electronically.  As this is a virtual visit, video technology does not allow for your provider to perform a traditional examination. This may limit your provider's ability to fully assess your condition. If your provider identifies any concerns that need to be evaluated in person or the need to arrange testing (such as labs, EKG, etc.), we will make arrangements to do so. Although advances in technology are sophisticated, we cannot ensure that it will always work on either your end or our end. If the connection with a video visit is poor, the visit may have to be switched to a telephone visit. With either a video or telephone visit, we are not always able to ensure that we have a secure connection.  By engaging in this virtual visit, you consent to the provision of healthcare and authorize for your insurance to be billed (if applicable) for the services provided during this visit. Depending on your insurance coverage, you may receive a charge related to this service.  I need to obtain your verbal consent now. Are you willing to proceed with your visit today? Thomas [name] has provided verbal consent on 02/21/2022 for a virtual visit (video or telephone). Thomas Loveless, PA-C  Date: 02/21/2022 1:38 PM  Virtual Visit via Video Note   I, Thomas [name], connected with  Thomas [name]  ([id number], [birth year]) on 02/21/22 at  1:30 PM EDT by a video-enabled telemedicine application and verified that I am speaking with the correct person using two identifiers. Wife, Thomas [name], assisted with the  video visit  Location: Patient: Virtual Visit Location Patient: Home Provider: Virtual Visit Location Provider: Home Office   I discussed the limitations of evaluation and management by telemedicine and the availability of in person appointments. The patient expressed understanding and agreed to proceed.    History of Present Illness: Thomas [name] is a 88 y.o. who identifies as a male who was assigned male at birth, and is being seen today for hip pain.  HPI: Hip Pain  The incident occurred 12 to 24 hours ago. The incident occurred at home. The injury mechanism was a twisting injury. The pain is present in the right hip (radiates into right leg). The quality of the pain is described as aching. The pain is moderate. The pain has been Constant since onset. Pertinent negatives include no inability to bear weight, muscle weakness, numbness or tingling. Associated symptoms comments: Able to ambulate but is painful. The symptoms are aggravated by movement and weight bearing. He has tried acetaminophen, non-weight bearing and rest (lidocaine) for the symptoms. The treatment provided no relief.     Problems:  Patient Active Problem List   Diagnosis Date Noted   Diabetic foot ulcer ([name]) 01/04/2022   Near syncope 03/16/2021   Clotting disorder ([name]) 03/16/2021   Orthostatic hypotension 10/01/2020   TIA (transient ischemic attack) 08/31/2020   Normocytic anemia 08/31/2020   Prolonged QT interval 08/31/2020   Acute CVA (cerebrovascular accident) ([name]) 08/31/2020   Debility 08/21/2020   Lytic bone lesion of femur 08/21/2020   Sick sinus syndrome (  [name]) 07/31/2020   Coronary artery disease s/p CABG 2001 07/17/2020   Hypothyroidism 07/17/2020   Venous thromboembolism (VTE) 07/17/2020   Hypertension associated with diabetes ([name]) 07/17/2020   Cardiac pacemaker 07/17/2020   Osteoarthritis 07/17/2020   Type 2 diabetes mellitus with hyperglycemia ([name]) 07/17/2020   Vitamin D deficiency 07/17/2020    Macular degeneration 07/17/2020   Dyslipidemia due to type 2 diabetes mellitus ([name]) 07/17/2020   Insomnia 07/17/2020   BPH (benign prostatic hyperplasia) 07/17/2020   Colon polyps 07/17/2020    Allergies:  Allergies  Allergen Reactions   Amitriptyline Shortness Of Breath and Palpitations   Lyrica [Pregabalin] Nausea And Vomiting   Pravachol [Pravastatin] Other (See Comments)    Muscle cramps and aches    Effexor Xr [Venlafaxine] Anxiety   Morphine Palpitations   Zolpidem Anxiety and Palpitations   Medications:  Current Outpatient Medications:    lidocaine (LIDODERM) 5 %, Place 1 patch onto the skin daily. Remove & Discard patch within 12 hours or as directed by MD, Disp: 5 patch, Rfl: 0   predniSONE (STERAPRED UNI-PAK 21 TAB) 10 MG (21) TBPK tablet, 6 day taper; take as directed on package instructions, Disp: 21 tablet, Rfl: 0   acetaminophen (TYLENOL) 325 MG tablet, Take 650 mg by mouth every 6 (six) hours as needed for moderate pain or headache., Disp: , Rfl:    amiodarone (PACERONE) 200 MG tablet, TAKE 1 TABLET(200 MG) BY MOUTH DAILY, Disp: 90 tablet, Rfl: 3   apixaban (ELIQUIS) 5 MG TABS tablet, Take 1 tablet (5 mg total) by mouth 2 (two) times daily., Disp: 180 tablet, Rfl: 1   B-D UF III MINI PEN NEEDLES 31G X 5 MM MISC, USE 1 DAILY WITH LANTUS PEN INJECTIONS, Disp: 100 each, Rfl: 3   carvedilol (COREG) 3.125 MG tablet, Take 1 tablet (3.125 mg total) by mouth 2 (two) times daily as needed (take only if Systolic Blood Pressure greater than 140)., Disp: 180 tablet, Rfl: 1   cholecalciferol (VITAMIN D3) 25 MCG (1000 UNIT) tablet, Take 1,000 Units by mouth daily., Disp: , Rfl:    co-enzyme Q-10 50 MG capsule, Take 50 mg by mouth daily., Disp: , Rfl:    ferrous sulfate 325 (65 FE) MG EC tablet, TAKE 1 TABLET(325 MG) BY MOUTH DAILY WITH BREAKFAST (Patient taking differently: Take 325 mg by mouth daily with breakfast.), Disp: 30 tablet, Rfl: 3   gabapentin (NEURONTIN) 100 MG capsule,  Take 2 capsules (200 mg total) by mouth at bedtime., Disp: 180 capsule, Rfl: 1   glucosamine-chondroitin 500-400 MG tablet, Take 1 tablet by mouth 3 (three) times daily., Disp: , Rfl:    LANTUS SOLOSTAR 100 UNIT/ML Solostar Pen, INJECT 13 UNITS INTO THE SKIN DAILY, Disp: 15 mL, Rfl: 3   levothyroxine (SYNTHROID) 100 MCG tablet, TAKE 1 TABLET(100 MCG) BY MOUTH DAILY, Disp: 90 tablet, Rfl: 0   midodrine (PROAMATINE) 2.5 MG tablet, Take 1 tablet (2.5 mg total) by mouth 3 (three) times daily with meals., Disp: 270 tablet, Rfl: 2   mirtazapine (REMERON SOL-TAB) 15 MG disintegrating tablet, DISSOLVE 1 TABLET(15 MG) ON THE TONGUE AT BEDTIME (Patient taking differently: Take 15 mg by mouth at bedtime.), Disp: 90 tablet, Rfl: 0   Multiple Vitamin (MULTIVITAMIN) tablet, Take 1 tablet by mouth daily., Disp: , Rfl:    oxyCODONE-acetaminophen (PERCOCET) 10-325 MG tablet, Take 1 tablet by mouth every 4 (four) hours as needed for pain., Disp: 30 tablet, Rfl: 0   Red Yeast Rice Extract (RED YEAST RICE PO),  Take 1 capsule by mouth in the morning and at bedtime., Disp: , Rfl:    rosuvastatin (CRESTOR) 20 MG tablet, Take 1 tablet (20 mg total) by mouth at bedtime., Disp: 90 tablet, Rfl: 3   tamsulosin (FLOMAX) 0.4 MG CAPS capsule, Take 1 capsule (0.4 mg total) by mouth daily after supper., Disp: 30 capsule, Rfl: 5  Observations/Objective: Patient is well-developed, well-nourished in no acute distress.  Resting comfortably at home.  Head is normocephalic, atraumatic.  No labored breathing.  Speech is clear and coherent with logical content.  Patient is alert and oriented at baseline.    Assessment and Plan: 1. Pain of left hip - predniSONE (STERAPRED UNI-PAK 21 TAB) 10 MG (21) TBPK tablet; 6 day taper; take as directed on package instructions  Dispense: 21 tablet; Refill: 0 - lidocaine (LIDODERM) 5 %; Place 1 patch onto the skin daily. Remove & Discard patch within 12 hours or as directed by MD  Dispense: 5  patch; Refill: 0  - Acute on chronic hip pain with twisting injury (just twisted funny and feels flared arthritis) - Failed Tylenol - Will add prednisone taper and lidocaine patches (NSAIDs avoided due to Eliquis) - May continue tylenol for breakthrough pain - Ice 20 minutes on, 40 minutes off and repeat throughout today as desired - Light stretches and exercises provided via AVS - Seek in person follow up if symptoms worsen or fail to improve with treatment  Follow Up Instructions: I discussed the assessment and treatment plan with the patient. The patient was provided an opportunity to ask questions and all were answered. The patient agreed with the plan and demonstrated an understanding of the instructions.  A copy of instructions were sent to the patient via MyChart unless otherwise noted below.    The patient was advised to call back or seek an in-person evaluation if the symptoms worsen or if the condition fails to improve as anticipated.  Time:  I spent 11 minutes with the patient via telehealth technology discussing the above problems/concerns.    Thomas Loveless, PA-C

## 2022-02-21 NOTE — Patient Instructions (Signed)
[name], thank you for joining Mar Daring, PA-C for today's virtual visit.  While this provider is not your primary care provider (PCP), if your PCP is located in our provider database this encounter information will be shared with them immediately following your visit.  Consent: (Patient) Thomas [name] provided verbal consent for this virtual visit at the beginning of the encounter.  Current Medications:  Current Outpatient Medications:    lidocaine (LIDODERM) 5 %, Place 1 patch onto the skin daily. Remove & Discard patch within 12 hours or as directed by MD, Disp: 5 patch, Rfl: 0   predniSONE (STERAPRED UNI-PAK 21 TAB) 10 MG (21) TBPK tablet, 6 day taper; take as directed on package instructions, Disp: 21 tablet, Rfl: 0   acetaminophen (TYLENOL) 325 MG tablet, Take 650 mg by mouth every 6 (six) hours as needed for moderate pain or headache., Disp: , Rfl:    amiodarone (PACERONE) 200 MG tablet, TAKE 1 TABLET(200 MG) BY MOUTH DAILY, Disp: 90 tablet, Rfl: 3   apixaban (ELIQUIS) 5 MG TABS tablet, Take 1 tablet (5 mg total) by mouth 2 (two) times daily., Disp: 180 tablet, Rfl: 1   B-D UF III MINI PEN NEEDLES 31G X 5 MM MISC, USE 1 DAILY WITH LANTUS PEN INJECTIONS, Disp: 100 each, Rfl: 3   carvedilol (COREG) 3.125 MG tablet, Take 1 tablet (3.125 mg total) by mouth 2 (two) times daily as needed (take only if Systolic Blood Pressure greater than 140)., Disp: 180 tablet, Rfl: 1   cholecalciferol (VITAMIN D3) 25 MCG (1000 UNIT) tablet, Take 1,000 Units by mouth daily., Disp: , Rfl:    co-enzyme Q-10 50 MG capsule, Take 50 mg by mouth daily., Disp: , Rfl:    ferrous sulfate 325 (65 FE) MG EC tablet, TAKE 1 TABLET(325 MG) BY MOUTH DAILY WITH BREAKFAST (Patient taking differently: Take 325 mg by mouth daily with breakfast.), Disp: 30 tablet, Rfl: 3   gabapentin (NEURONTIN) 100 MG capsule, Take 2 capsules (200 mg total) by mouth at bedtime., Disp: 180 capsule, Rfl: 1    glucosamine-chondroitin 500-400 MG tablet, Take 1 tablet by mouth 3 (three) times daily., Disp: , Rfl:    LANTUS SOLOSTAR 100 UNIT/ML Solostar Pen, INJECT 13 UNITS INTO THE SKIN DAILY, Disp: 15 mL, Rfl: 3   levothyroxine (SYNTHROID) 100 MCG tablet, TAKE 1 TABLET(100 MCG) BY MOUTH DAILY, Disp: 90 tablet, Rfl: 0   midodrine (PROAMATINE) 2.5 MG tablet, Take 1 tablet (2.5 mg total) by mouth 3 (three) times daily with meals., Disp: 270 tablet, Rfl: 2   mirtazapine (REMERON SOL-TAB) 15 MG disintegrating tablet, DISSOLVE 1 TABLET(15 MG) ON THE TONGUE AT BEDTIME (Patient taking differently: Take 15 mg by mouth at bedtime.), Disp: 90 tablet, Rfl: 0   Multiple Vitamin (MULTIVITAMIN) tablet, Take 1 tablet by mouth daily., Disp: , Rfl:    oxyCODONE-acetaminophen (PERCOCET) 10-325 MG tablet, Take 1 tablet by mouth every 4 (four) hours as needed for pain., Disp: 30 tablet, Rfl: 0   Red Yeast Rice Extract (RED YEAST RICE PO), Take 1 capsule by mouth in the morning and at bedtime., Disp: , Rfl:    rosuvastatin (CRESTOR) 20 MG tablet, Take 1 tablet (20 mg total) by mouth at bedtime., Disp: 90 tablet, Rfl: 3   tamsulosin (FLOMAX) 0.4 MG CAPS capsule, Take 1 capsule (0.4 mg total) by mouth daily after supper., Disp: 30 capsule, Rfl: 5   Medications ordered in this encounter:  Meds ordered this encounter  Medications   predniSONE (  STERAPRED UNI-PAK 21 TAB) 10 MG (21) TBPK tablet    Sig: 6 day taper; take as directed on package instructions    Dispense:  21 tablet    Refill:  0    Order Specific Question:   Supervising Provider    Answer:   MILLER, BRIAN [[id number]]   lidocaine (LIDODERM) 5 %    Sig: Place 1 patch onto the skin daily. Remove & Discard patch within 12 hours or as directed by MD    Dispense:  5 patch    Refill:  0    Order Specific Question:   Supervising Provider    Answer:   Eber Hong [[id number]]     *If you need refills on other medications prior to your next appointment, please contact your  pharmacy*  Follow-Up: Call back or seek an in-person evaluation if the symptoms worsen or if the condition fails to improve as anticipated.  Other Instructions Hip Pain The hip is the joint between the upper legs and the lower pelvis. The bones, cartilage, tendons, and muscles of your hip joint support your body and allow you to move around. Hip pain can range from a minor ache to severe pain in one or both of your hips. The pain may be felt on the inside of the hip joint near the groin, or on the outside near the buttocks and upper thigh. You may also have swelling or stiffness in your hip area. Follow these instructions at home: Managing pain, stiffness, and swelling     If directed, put ice on the painful area. To do this: Put ice in a plastic bag. Place a towel between your skin and the bag. Leave the ice on for 20 minutes, 2-3 times a day. If directed, apply heat to the affected area as often as told by your health care provider. Use the heat source that your health care provider recommends, such as a moist heat pack or a heating pad. Place a towel between your skin and the heat source. Leave the heat on for 20-30 minutes. Remove the heat if your skin turns bright red. This is especially important if you are unable to feel pain, heat, or cold. You may have a greater risk of getting burned. Activity Do exercises as told by your health care provider. Avoid activities that cause pain. General instructions  Take over-the-counter and prescription medicines only as told by your health care provider. Keep a journal of your symptoms. Write down: How often you have hip pain. The location of your pain. What the pain feels like. What makes the pain worse. Sleep with a pillow between your legs on your most comfortable side. Keep all follow-up visits as told by your health care provider. This is important. Contact a health care provider if: You cannot put weight on your leg. Your pain or  swelling continues or gets worse after one week. It gets harder to walk. You have a fever. Get help right away if: You fall. You have a sudden increase in pain and swelling in your hip. Your hip is red or swollen or very tender to touch. Summary Hip pain can range from a minor ache to severe pain in one or both of your hips. The pain may be felt on the inside of the hip joint near the groin, or on the outside near the buttocks and upper thigh. Avoid activities that cause pain. Write down how often you have hip pain, the location of the pain, what makes  it worse, and what it feels like. This information is not intended to replace advice given to you by your health care provider. Make sure you discuss any questions you have with your health care provider. Document Revised: 10/30/2018 Document Reviewed: 10/30/2018 Elsevier Patient Education  [place].  Hip Exercises Ask your health care provider which exercises are safe for you. Do exercises exactly as told by your health care provider and adjust them as directed. It is normal to feel mild stretching, pulling, tightness, or discomfort as you do these exercises. Stop right away if you feel sudden pain or your pain gets worse. Do not begin these exercises until told by your health care provider. Stretching and range-of-motion exercises These exercises warm up your muscles and joints and improve the movement and flexibility of your hip. These exercises also help to relieve pain, numbness, and tingling. You may be asked to limit your range of motion if you had a hip replacement. Talk to your health care provider about these restrictions. Hamstrings, supine  Lie on your back (supine position). Loop a belt or towel over the ball of your left / right foot. The ball of your foot is on the walking surface, right under your toes. Straighten your left / right knee and slowly pull on the belt or towel to raise your leg until you feel a gentle  stretch behind your knee (hamstring). Do not let your knee bend while you do this. Keep your other leg flat on the floor. Hold this position for __________ seconds. Slowly return your leg to the starting position. Repeat __________ times. Complete this exercise __________ times a day. Hip rotation  Lie on your back on a firm surface. With your left / right hand, gently pull your left / right knee toward the shoulder that is on the same side of the body. Stop when your knee is pointing toward the ceiling. Hold your left / right ankle with your other hand. Keeping your knee steady, gently pull your left / right ankle toward your other shoulder until you feel a stretch in your buttocks. Keep your hips and shoulders firmly planted while you do this stretch. Hold this position for __________ seconds. Repeat __________ times. Complete this exercise __________ times a day. Seated stretch This exercise is sometimes called hamstrings and adductors stretch. Sit on the floor with your legs stretched wide. Keep your knees straight during this exercise. Keeping your head and back in a straight line, bend at your waist to reach for your left foot (position A). You should feel a stretch in your right inner thigh (adductors). Hold this position for __________ seconds. Then slowly return to the upright position. Keeping your head and back in a straight line, bend at your waist to reach forward (position B). You should feel a stretch behind both of your thighs and knees (hamstrings). Hold this position for __________ seconds. Then slowly return to the upright position. Keeping your head and back in a straight line, bend at your waist to reach for your right foot (position C). You should feel a stretch in your left inner thigh (adductors). Hold this position for __________ seconds. Then slowly return to the upright position. Repeat __________ times. Complete this exercise __________ times a day. Lunge This  exercise stretches the muscles of the hip (hip flexors). Place your left / right knee on the floor and bend your other knee so that is directly over your ankle. You should be half-kneeling. Keep good posture  with your head over your shoulders. Tighten your buttocks to point your tailbone downward. This will prevent your back from arching too much. You should feel a gentle stretch in the front of your left / right thigh and hip. If you do not feel a stretch, slide your other foot forward slightly and then slowly lunge forward with your chest up until your knee once again lines up over your ankle. Make sure your tailbone continues to point downward. Hold this position for __________ seconds. Slowly return to the starting position. Repeat __________ times. Complete this exercise __________ times a day. Strengthening exercises These exercises build strength and endurance in your hip. Endurance is the ability to use your muscles for a long time, even after they get tired. Bridge This exercise strengthens the muscles of your hip (hip extensors). Lie on your back on a firm surface with your knees bent and your feet flat on the floor. Tighten your buttocks muscles and lift your bottom off the floor until the trunk of your body and your hips are level with your thighs. Do not arch your back. You should feel the muscles working in your buttocks and the back of your thighs. If you do not feel these muscles, slide your feet 1-2 inches (2.5-5 cm) farther away from your buttocks. Hold this position for __________ seconds. Slowly lower your hips to the starting position. Let your muscles relax completely between repetitions. Repeat __________ times. Complete this exercise __________ times a day. Straight leg raises, side-lying This exercise strengthens the muscles that move the hip joint away from the center of the body (hip abductors). Lie on your side with your left / right leg in the top position. Lie so  your head, shoulder, hip, and knee line up. You may bend your bottom knee slightly to help you balance. Roll your hips slightly forward, so your hips are stacked directly over each other and your left / right knee is facing forward. Leading with your heel, lift your top leg 4-6 inches (10-15 cm). You should feel the muscles in your top hip lifting. Do not let your foot drift forward. Do not let your knee roll toward the ceiling. Hold this position for __________ seconds. Slowly return to the starting position. Let your muscles relax completely between repetitions. Repeat __________ times. Complete this exercise __________ times a day. Straight leg raises, side-lying This exercise strengthens the muscles that move the hip joint toward the center of the body (hip adductors). Lie on your side with your left / right leg in the bottom position. Lie so your head, shoulder, hip, and knee line up. You may place your upper foot in front to help you balance. Roll your hips slightly forward, so your hips are stacked directly over each other and your left / right knee is facing forward. Tense the muscles in your inner thigh and lift your bottom leg 4-6 inches (10-15 cm). Hold this position for __________ seconds. Slowly return to the starting position. Let your muscles relax completely between repetitions. Repeat __________ times. Complete this exercise __________ times a day. Straight leg raises, supine This exercise strengthens the muscles in the front of your thigh (quadriceps). Lie on your back (supine position) with your left / right leg extended and your other knee bent. Tense the muscles in the front of your left / right thigh. You should see your kneecap slide up or see increased dimpling just above your knee. Keep these muscles tight as you raise your leg  4-6 inches (10-15 cm) off the floor. Do not let your knee bend. Hold this position for __________ seconds. Keep these muscles tense as you  lower your leg. Relax the muscles slowly and completely between repetitions. Repeat __________ times. Complete this exercise __________ times a day. Hip abductors, standing This exercise strengthens the muscles that move the leg and hip joint away from the center of the body (hip abductors). Tie one end of a rubber exercise band or tubing to a secure surface, such as a chair, table, or pole. Loop the other end of the band or tubing around your left / right ankle. Keeping your ankle with the band or tubing directly opposite the secured end, step away until there is tension in the tubing or band. Hold on to a chair, table, or pole as needed for balance. Lift your left / right leg out to your side. While you do this: Keep your back upright. Keep your shoulders over your hips. Keep your toes pointing forward. Make sure to use your hip muscles to slowly lift your leg. Do not tip your body or forcefully lift your leg. Hold this position for __________ seconds. Slowly return to the starting position. Repeat __________ times. Complete this exercise __________ times a day. Squats This exercise strengthens the muscles in the front of your thigh (quadriceps). Stand in a door frame so your feet and knees are in line with the frame. You may place your hands on the frame for balance. Slowly bend your knees and lower your hips like you are going to sit in a chair. Keep your lower legs in a straight-up-and-down position. Do not let your hips go lower than your knees. Do not bend your knees lower than told by your health care provider. If your hip pain increases, do not bend as low. Hold this position for ___________ seconds. Slowly push with your legs to return to standing. Do not use your hands to pull yourself to standing. Repeat __________ times. Complete this exercise __________ times a day. This information is not intended to replace advice given to you by your health care provider. Make sure you  discuss any questions you have with your health care provider. Document Revised: 10/29/2020 Document Reviewed: 10/29/2020 Elsevier Patient Education  2023 [employer]    If you have been instructed to have an in-person evaluation today at a local Urgent Care facility, please use the link below. It will take you to a list of all of our available [facility], including address, phone number and hours of operation. Please do not delay care.  [facility]  If you or a family member do not have a primary care provider, use the link below to schedule a visit and establish care. When you choose a [facility] primary care physician or advanced practice provider, you gain a long-term partner in health. Find a Primary Care Provider  Learn more about [facility] in-office and virtual care options: [facility] - Get Care Now

## 2022-02-25 DIAGNOSIS — I951 Orthostatic hypotension: Secondary | ICD-10-CM | POA: Diagnosis not present

## 2022-02-25 DIAGNOSIS — G451 Carotid artery syndrome (hemispheric): Secondary | ICD-10-CM | POA: Diagnosis not present

## 2022-03-15 NOTE — Progress Notes (Signed)
Chronic Care Management Pharmacy Note  03/22/2022 Name:  Thomas [name] MRN:  [id number] DOB:  16-May-1929  Summary: PharmD Follow up - no changes.  Monitor renal function for Eliquis dose due to age.  Most recent Scr 1.46 - would need dose reduction to 2.36m BID once Scr > 1.5.  Denies hypoglycemia.  No changes  Fu 6 months   Subjective: Thomas [name] an 88y.o. year old male who is a primary patient of Thomas [name].  The CCM team was consulted for assistance with disease management and care coordination needs.    Engaged with patient by telephone for follow up visit in response to provider referral for pharmacy case management and/or care coordination services.   Consent to Services:  The patient was given the following information about Chronic Care Management services today, agreed to services, and gave verbal consent: 1. CCM service includes personalized support from designated clinical staff supervised by the primary care provider, including individualized plan of care and coordination with other care providers 2. 24/7 contact phone numbers for assistance for urgent and routine care needs. 3. Service will only be billed when office clinical staff spend 20 minutes or more in a month to coordinate care. 4. Only one practitioner may furnish and bill the service in a calendar month. 5.The patient may stop CCM services at any time (effective at the end of the month) by phone call to the office staff. 6. The patient will be responsible for cost sharing (co-pay) of up to 20% of the service fee (after annual deductible is met). Patient agreed to services and consent obtained.  Patient Care Team: PVivi Barrack [name] as PCP - General (Family Medicine) PFreada Bergeron [name] as PCP - Cardiology (Cardiology) DEdythe Clarity [facility] (Pharmacist)  Recent office visits:  01/11/2022 OV (PCP) PVivi Barrack [name]; Gabapentin 300 mg nightly works well for sleep however he  is having some next-day drowsiness.  Will decrease dose to 200 mg daily.   10/12/2021 OV (PCP) PVivi Barrack [name];    Recent consult visits:  01/04/2022 OV (Cardiology) PFreada Bergeron [name]; -Will stop coreg due to episodes of low blood pressure, -Change coreg 3.1278mBID to prn for SBP>140 to avoid hypotension, Stopped ASA given need for [facility] Groupnd high bleed risk.   11/19/2021 OV (Podiatry) ReWallene [name]; we will continue to keep him on doxycycline   10/23/2021 OV (Podiatry) ReWallene [name];   I placed on doxycycline twice daily and Percocet as needed.   [facility]:  [facility]  Objective:  Lab Results  Component Value Date   CREATININE 1.46 (H) 10/10/2021   BUN 23 10/10/2021   GFR 52.56 (L) 08/21/2020   GFRNONAA 45 (L) 10/10/2021   GFRAA 66 08/07/2020   NA 135 10/10/2021   K 4.4 10/10/2021   CALCIUM 9.3 10/10/2021   CO2 24 10/10/2021   GLUCOSE 201 (H) 10/10/2021    Lab Results  Component Value Date/Time   HGBA1C 7.7 (A) 01/11/2022 11:18 AM   HGBA1C 7.7 (A) 10/12/2021 11:42 AM   HGBA1C 7.4 (H) 03/16/2021 09:35 PM   HGBA1C 7.5 (H) 08/31/2020 05:23 AM   HGBA1C 7.7 12/03/2019 12:00 AM   GFR 52.56 (L) 08/21/2020 11:43 AM   GFR 49.18 (L) 07/29/2020 10:52 AM    Last diabetic Eye exam: No results found for: "HMDIABEYEEXA"  Last diabetic Foot exam:  No results found for: "HMDIABFOOTEX"   Lab Results  Component Value Date   CHOL 90 08/31/2020   HDL 31 (L) 08/31/2020   LDLCALC 37 08/31/2020   TRIG 108 08/31/2020   CHOLHDL 2.9 08/31/2020       Latest Ref Rng & Units 04/28/2021   10:45 AM 03/23/2021    2:31 AM 03/22/2021    2:53 AM  Hepatic Function  Total Protein 6.0 - 8.5 g/dL 6.5     Albumin 3.5 - 4.6 g/dL 3.8  2.8  2.7   AST 0 - 40 IU/L 22     ALT 0 - 44 IU/L 22     Alk Phosphatase 44 - 121 IU/L 67     Total Bilirubin 0.0 - 1.2 mg/dL 0.3       Lab Results   Component Value Date/Time   TSH 4.900 (H) 06/11/2021 01:39 PM   TSH 4.530 (H) 04/28/2021 10:45 AM       Latest Ref Rng & Units 10/10/2021    4:37 PM 04/03/2021   11:50 AM 03/23/2021    2:31 AM  CBC  WBC 4.0 - 10.5 K/uL 12.1  10.3  11.5   Hemoglobin 13.0 - 17.0 g/dL 12.7  11.6  11.6   Hematocrit 39.0 - 52.0 % 38.9  35.4  34.6   Platelets 150 - 400 K/uL 188  229  151     Lab Results  Component Value Date/Time   VD25OH 46.27 07/17/2020 10:42 AM    Clinical ASCVD: Yes  The ASCVD Risk score (Arnett DK, et al., 2019) failed to calculate for the following reasons:   The 2019 ASCVD risk score is only valid for ages 86 to 86   The patient has a prior MI or stroke diagnosis       01/11/2022   11:06 AM 01/04/2022    3:01 PM 05/14/2021    1:25 PM  Depression screen PHQ 2/9  Decreased Interest 0 0 0  Down, Depressed, Hopeless 0 0 0  PHQ - 2 Score 0 0 0     Social History   Tobacco Use  Smoking Status Former   Types: Pipe  Smokeless Tobacco Never   BP Readings from Last 3 Encounters:  01/11/22 100/60  01/04/22 110/60  01/04/22 110/62   Pulse Readings from Last 3 Encounters:  01/11/22 69  01/04/22 73  01/04/22 69   Wt Readings from Last 3 Encounters:  01/04/22 176 lb (79.8 kg)  01/04/22 174 lb (78.9 kg)  10/12/21 160 lb (72.6 kg)   BMI Readings from Last 3 Encounters:  01/11/22 26.76 kg/m  01/04/22 26.76 kg/m  01/04/22 26.46 kg/m    Assessment/Interventions: Review of patient past medical history, allergies, medications, health status, including review of consultants reports, laboratory and other test data, was performed as part of comprehensive evaluation and provision of chronic care management services.   SDOH:  (Social Determinants of Health) assessments and interventions performed: No, done within year Tobacco Use: Medium Risk (02/21/2022)   Patient History    Smoking Tobacco Use: Former    Smokeless Tobacco Use: Never    Passive Exposure: Not on file     Food Insecurity: No Food Insecurity (05/14/2021)   Hunger Vital Sign    Worried About Running Out of Food in the Last Year: Never true    [place] in the Last Year: Never true    SDOH Interventions    Flowsheet Row Chronic Care Management from 05/14/2021 in [place]  [facility] [place]  SDOH Interventions   Food Insecurity Interventions Intervention Not Indicated  Housing Interventions Intervention Not Indicated  Transportation Interventions Intervention Not Indicated      Financial Resource Strain: Low Risk  (03/10/2021)   Overall Financial Resource Strain (CARDIA)    Difficulty of Paying Living Expenses: Not very hard    SDOH Screenings   Food Insecurity: No Food Insecurity (05/14/2021)  Housing: Low Risk  (05/14/2021)  Transportation Needs: No Transportation Needs (05/14/2021)  Depression (PHQ2-9): Low Risk  (01/11/2022)  Financial Resource Strain: Low Risk  (03/10/2021)  Tobacco Use: Medium Risk (02/21/2022)    CCM Care Plan  Allergies  Allergen Reactions   Amitriptyline Shortness Of Breath and Palpitations   Lyrica [Pregabalin] Nausea And Vomiting   Pravachol [Pravastatin] Other (See Comments)    Muscle cramps and aches    Effexor Xr [Venlafaxine] Anxiety   Morphine Palpitations   Zolpidem Anxiety and Palpitations    Medications Reviewed Today     Reviewed by Edythe Clarity, [facility] (Pharmacist) on 03/22/22 at [place] List Status: <None>   Medication Order Taking? Sig Documenting Provider Last Dose Status Informant  acetaminophen (TYLENOL) 325 MG tablet [id number] Yes Take 650 mg by mouth every 6 (six) hours as needed for moderate pain or headache. Provider, Historical, [name] Taking Active Self  amiodarone (PACERONE) 200 MG tablet [id number] Yes TAKE 1 TABLET(200 MG) BY MOUTH DAILY Loel Dubonnet, NP Taking Active   apixaban (ELIQUIS) 5 MG TABS tablet [id number] Yes Take 1 tablet (5 mg total) by mouth 2 (two) times daily. Freada Bergeron, [name]  Taking Active   B-D UF III MINI PEN NEEDLES 31G X 5 MM MISC [id number] Yes USE 1 DAILY WITH LANTUS PEN INJECTIONS Vivi Barrack, [name] Taking Active Self  carvedilol (COREG) 3.125 MG tablet [id number] Yes Take 1 tablet (3.125 mg total) by mouth 2 (two) times daily as needed (take only if Systolic Blood Pressure greater than 140). Freada Bergeron, [name] Taking Active   cholecalciferol (VITAMIN D3) 25 MCG (1000 UNIT) tablet [id number] Yes Take 1,000 Units by mouth daily. Provider, Historical, [name] Taking Active Self  co-enzyme Q-10 50 MG capsule [id number] Yes Take 50 mg by mouth daily. Provider, Historical, [name] Taking Active Self  ferrous sulfate 325 (65 FE) MG EC tablet [id number] Yes TAKE 1 TABLET(325 MG) BY MOUTH DAILY WITH BREAKFAST  Patient taking differently: Take 325 mg by mouth daily with breakfast.   Orson Slick, [name] Taking Active Self  gabapentin (NEURONTIN) 100 MG capsule [id number] Yes Take 2 capsules (200 mg total) by mouth at bedtime. Vivi Barrack, [name] Taking Active   glucosamine-chondroitin 500-400 MG tablet [id number] Yes Take 1 tablet by mouth 3 (three) times daily. Provider, Historical, [name] Taking Active Self  LANTUS SOLOSTAR 100 UNIT/ML Solostar Pen [id number] Yes INJECT 13 UNITS INTO THE SKIN DAILY Vivi Barrack, [name] Taking Active   levothyroxine (SYNTHROID) 100 MCG tablet [id number] Yes TAKE 1 TABLET(100 MCG) BY MOUTH DAILY Vivi Barrack, [name] Taking Active   lidocaine (LIDODERM) 5 % [id number] Yes Place 1 patch onto the skin daily. Remove & Discard patch within 12 hours or as directed by [name] Mar Daring, PA-C Taking Active   midodrine (PROAMATINE) 2.5 MG tablet [id number] Yes Take 1 tablet (2.5 mg total) by mouth 3 (three) times daily with meals. Freada Bergeron, [name] Taking Active   mirtazapine (REMERON SOL-TAB) 15 MG disintegrating tablet [id number] Yes DISSOLVE 1 TABLET(15 MG) ON THE  TONGUE AT BEDTIME  Patient taking differently: Take 15 mg by mouth at bedtime.   Vivi Barrack, [name] Taking Active Self  Multiple Vitamin (MULTIVITAMIN) tablet [id number] Yes Take 1 tablet by mouth daily. Provider, Historical, [name] Taking Active Self  oxyCODONE-acetaminophen (PERCOCET) 10-325 MG tablet [id number] Yes Take 1 tablet by mouth every 4 (four) hours as needed for pain. Thomas Huh, DPM Taking Active   predniSONE (STERAPRED UNI-PAK 21 TAB) 10 MG (21) TBPK tablet [id number] Yes 6 day taper; take as directed on package instructions Mar Daring, PA-C Taking Active   Red Yeast Rice Extract (RED YEAST RICE PO) [id number] Yes Take 1 capsule by mouth in the morning and at bedtime. Provider, Historical, [name] Taking Active Self  rosuvastatin (CRESTOR) 20 MG tablet [id number] Yes Take 1 tablet (20 mg total) by mouth at bedtime. Freada Bergeron, [name] Taking Active   tamsulosin [facility]) 0.4 MG CAPS capsule [id number] Yes Take 1 capsule (0.4 mg total) by mouth daily after supper. Vivi Barrack, [name] Taking Active             Patient Active Problem List   Diagnosis Date Noted   Diabetic foot ulcer ([place]) 01/04/2022   Near syncope 03/16/2021   Clotting disorder ([place]) 03/16/2021   Orthostatic hypotension 10/01/2020   TIA (transient ischemic attack) 08/31/2020   Normocytic anemia 08/31/2020   Prolonged QT interval 08/31/2020   Acute CVA (cerebrovascular accident) ([place]) 08/31/2020   Debility 08/21/2020   Lytic bone lesion of femur 08/21/2020   Sick sinus syndrome ([place]) 07/31/2020   Coronary artery disease s/p CABG 2001 07/17/2020   Hypothyroidism 07/17/2020   Venous thromboembolism (VTE) 07/17/2020   Hypertension associated with diabetes ([place]) 07/17/2020   Cardiac pacemaker 07/17/2020   Osteoarthritis 07/17/2020   Type 2 diabetes mellitus with hyperglycemia ([place]) 07/17/2020   Vitamin D deficiency 07/17/2020   Macular degeneration 07/17/2020   Dyslipidemia due to type 2 diabetes mellitus ([place]) 07/17/2020   Insomnia 07/17/2020   BPH (benign prostatic hyperplasia)  07/17/2020   Colon polyps 07/17/2020    Immunization History  Administered Date(s) Administered   Influenza-Unspecified 03/17/2020   Moderna Sars-Covid-2 Vaccination 09/04/2019, 10/03/2019   Zoster Recombinat (Shingrix) 06/28/2012    Conditions to be addressed/monitored:  CAD, HTN, Hx of CVA, Hypothyroidism, Type II DM, Insomnia  Care Plan : General Pharmacy (Adult)  Updates made by Edythe Clarity, RPH since 03/22/2022 12:00 AM     Problem: CAD, HTN, Hx of CVA, Hypothyroidism, Type II DM, Insomnia   Priority: High  Onset Date: 03/10/2021     Long-Range Goal: Patient-Specific Goal   Start Date: 03/10/2021  Expected End Date: 09/07/2021  Recent Progress: On track  Priority: High  Note:   Current Barriers:  None at this time  Pharmacist Clinical Goal(s):  Patient will maintain control of glucose and BP as evidenced by home monitoring  through collaboration with PharmD and provider.   Interventions: 1:1 collaboration with Vivi Barrack, [name] regarding development and update of comprehensive plan of care as evidenced by provider attestation and co-signature Inter-disciplinary care team collaboration (see longitudinal plan of care) Comprehensive medication review performed; medication list updated in electronic medical record  Hypertension/Hx of TIA (BP goal <140/90) 03/22/22 -Controlled -Current treatment: Carvedilol 12.69m BID Appropriate, Effective, Safe, Accessible Eliquis 567mBID Appropriate, Effective, Safe, Accessible -Medications previously tried: ramipril, spironolactone  -Current home readings: no logs discussed -Current dietary habits: good appetite -Current exercise habits: doing physical therapy -Denies hypotensive/hypertensive symptoms -Educated on  BP goals and benefits of medications for prevention of heart attack, stroke and kidney damage; Importance of home blood pressure monitoring; Symptoms of hypotension and importance of maintaining adequate  hydration; -Counseled to monitor BP at home daily, document, and provide log at future appointments -Continues to only take Coreg if BP is elevated per cardio. -Eliquis remains 50m BID - age is over 89but Scr and weight still not at threshold.  Scr is borderline continue to monitor.  Over 1.5 = dose reduction to 2.541mbid. Based on history I would leave as is for now.  Most recent Scr was 1.46  Update 09/07/21 BP controlled at home. His wife states he is doing well overall. Monitor dose of Eliquis - need to monitor Scr each time to make sure he does not need 2.74m774mID since he is over [age]ars old.  Currently weight and Scr are not at the requirements to lower dose. Will continue to monitor.  Diabetes (A1c goal <8) 03/22/22 -Controlled, last A1c 7.7% -Current medications: Lantus 13 units  Appropriate, Effective, Safe, Accessible -Medications previously tried: glimepiride  -Current home glucose readings fasting glucose: no logs discussed "normal range" post prandial glucose: N/A -Denies hypoglycemic/hyperglycemic symptoms -No glucose readings < 80 -Educated on A1c and blood sugar goals; Prevention and management of hypoglycemic episodes; -Due for foot and eye exam -Lantus now 13 units daily - continues to deny hypoglycemia. -At this point more concerned about hypo than anything else.  A1c goal < 8% acceptable. No changes needed at this time.  Update 09/07/21 A1c continues to be controlled. He is not having any hypoglycemia. Perfectly acceptable A1c goal < 8 due to age. Lantus still affordable - no changes at this time.  Hypothyroidism (Goal: Maintain TSH) -Controlled, not assessed -Current treatment  Levothyroxine 100m91maily -Medications previously tried: none noted -Takes appropriately, TSH is normal  -Recommended to continue current medication  Insomnia (Goal: Adequate sleep) -Controlled, not assessed -Current treatment  Mirtazapine 7.74mg 62mly -Medications  previously tried: none -Sleep is reported as good for the most part, also added benefit of appetite stimulation with mirtazapine  -Recommended to continue current medication  Patient Goals/Self-Care Activities Patient will:  - take medications as prescribed check glucose daily, document, and provide at future appointments check blood pressure daily, document, and provide at future appointments  Follow Up Plan: The care management team will reach out to the patient again over the next 180 days.           Medication Assistance: None required.  Patient affirms current coverage meets needs.  Compliance/Adherence/Medication fill history: Care Gaps: Foot exam Eye exam  Star-Rating Drugs: Rosuvastatin 20mg 73my 01/09/22 90ds  [id number] [address] [place] [id number]: [phone] pill box? Yes Pt endorses 100% compliance  We discussed: Current pharmacy is preferred with insurance plan and patient is satisfied with pharmacy services Patient decided to: Continue current medication management strategy  Care Plan and Follow Up Patient Decision:  Patient agrees to Care Plan and Follow-up.  Plan: The care management team will reach out to the patient again over the next 180 days.  ChristBeverly MilchmD Clinical Pharmacist [phone]

## 2022-03-22 ENCOUNTER — Ambulatory Visit (INDEPENDENT_AMBULATORY_CARE_PROVIDER_SITE_OTHER): Payer: Medicare Other | Admitting: Pharmacist

## 2022-03-22 ENCOUNTER — Encounter: Payer: Self-pay | Admitting: *Deleted

## 2022-03-22 DIAGNOSIS — I152 Hypertension secondary to endocrine disorders: Secondary | ICD-10-CM

## 2022-03-22 DIAGNOSIS — E1165 Type 2 diabetes mellitus with hyperglycemia: Secondary | ICD-10-CM

## 2022-03-22 NOTE — Patient Instructions (Addendum)
Visit Information   Goals Addressed             This Visit's Progress    Set My Target A1C-Diabetes Type 2   On track    Timeframe:  Long-Range Goal Priority:  High Start Date:   03/10/21                          Expected End Date: 09/07/21                      Follow Up Date 06/09/21    - set target A1C  Maintain A1c < 8.0, with no hypoglycemia    Why is this important?   Your target A1C is decided together by you and your doctor.  It is based on several things like your age and other health issues.    Notes:        Patient Care Plan: General Pharmacy (Adult)     Problem Identified: CAD, HTN, Hx of CVA, Hypothyroidism, Type II DM, Insomnia   Priority: High  Onset Date: 03/10/2021     Long-Range Goal: Patient-Specific Goal   Start Date: 03/10/2021  Expected End Date: 09/07/2021  Recent Progress: On track  Priority: High  Note:   Current Barriers:  None at this time  Pharmacist Clinical Goal(s):  Patient will maintain control of glucose and BP as evidenced by home monitoring  through collaboration with PharmD and provider.   Interventions: 1:1 collaboration with Vivi Barrack, MD regarding development and update of comprehensive plan of care as evidenced by provider attestation and co-signature Inter-disciplinary care team collaboration (see longitudinal plan of care) Comprehensive medication review performed; medication list updated in electronic medical record  Hypertension/Hx of TIA (BP goal <140/90) 03/22/22 -Controlled -Current treatment: Carvedilol 12.49m BID Appropriate, Effective, Safe, Accessible Eliquis 5106mBID Appropriate, Effective, Safe, Accessible -Medications previously tried: ramipril, spironolactone  -Current home readings: no logs discussed -Current dietary habits: good appetite -Current exercise habits: doing physical therapy -Denies hypotensive/hypertensive symptoms -Educated on BP goals and benefits of medications for prevention of  heart attack, stroke and kidney damage; Importance of home blood pressure monitoring; Symptoms of hypotension and importance of maintaining adequate hydration; -Counseled to monitor BP at home daily, document, and provide log at future appointments -Continues to only take Coreg if BP is elevated per cardio. -Eliquis remains 52m94mID - age is over 89 85t Scr and weight still not at threshold.  Scr is borderline continue to monitor.  Over 1.5 = dose reduction to 2.52mg53md. Based on history I would leave as is for now.  Most recent Scr was 1.46  Update 09/07/21 BP controlled at home. His wife states he is doing well overall. Monitor dose of Eliquis - need to monitor Scr each time to make sure he does not need 2.52mg 54m since he is over 80 ye8s old.  Currently weight and Scr are not at the requirements to lower dose. Will continue to monitor.  Diabetes (A1c goal <8) 03/22/22 -Controlled, last A1c 7.7% -Current medications: Lantus 13 units  Appropriate, Effective, Safe, Accessible -Medications previously tried: glimepiride  -Current home glucose readings fasting glucose: no logs discussed "normal range" post prandial glucose: N/A -Denies hypoglycemic/hyperglycemic symptoms -No glucose readings < 80 -Educated on A1c and blood sugar goals; Prevention and management of hypoglycemic episodes; -Due for foot and eye exam -Lantus now 13 units daily - continues to deny hypoglycemia. -At this point more concerned  about hypo than anything else.  A1c goal < 8% acceptable. No changes needed at this time.  Update 09/07/21 A1c continues to be controlled. He is not having any hypoglycemia. Perfectly acceptable A1c goal < 8 due to age. Lantus still affordable - no changes at this time.  Hypothyroidism (Goal: Maintain TSH) -Controlled, not assessed -Current treatment  Levothyroxine 13mg daily -Medications previously tried: none noted -Takes appropriately, TSH is normal  -Recommended to  continue current medication  Insomnia (Goal: Adequate sleep) -Controlled, not assessed -Current treatment  Mirtazapine 7.589mdaily -Medications previously tried: none -Sleep is reported as good for the most part, also added benefit of appetite stimulation with mirtazapine  -Recommended to continue current medication  Patient Goals/Self-Care Activities Patient will:  - take medications as prescribed check glucose daily, document, and provide at future appointments check blood pressure daily, document, and provide at future appointments  Follow Up Plan: The care management team will reach out to the patient again over the next 180 days.        Patient Care Plan: RNCM  General Plan of Care (Adult)  RESOLVING CASE DUE TO GOALS BEING MET  Completed 01/05/2022   Problem Identified: Knowledge deficit of wife caring for patient with diabetes and HTN Resolved 01/05/2022  Priority: Medium  Note:   RESOLVING CASE DUE TO GOALS BEING MET    Long-Range Goal: Wife will work with CCM team to gain knowledge about chronic care conditions. Completed 01/05/2022  Start Date: 05/21/2021  Expected End Date: 05/21/2022  Priority: Medium  Note:   RESOLVING CASE DUE TO GOALS BEING MET  Current Barriers:  Knowledge Deficits related to plan of care for management of HTN and DMII  Chronic Disease Management support and education needs related to HTN and DMII  Spoke with wife per patient request and patient very hard of hearing.  Wife reports patient is doing well.  Denies any pain, shortness of breath or swelling.  Last blood sugar was 145 with recent ranges of 130-140's; BP ranges 120-10/80's.  Does acknowledge patient had fall in  the home without injury last month.  Wife states patient does well with home health therapy and would like for pateint to resume after the new year.   2/9--Wife reports patient up all night and sleeps all day; states increased Remeron dose per provider instruction for the last  week with no change in sleep pattern.  Blood sugar  157 after eating this morning with recent ranges of 100-150's.  BP 167/80.  Does complain of knee pain wife plans to discuss with PCP 3/16--doing well and sleeping throughout the night with the increase of bedtime medication.  BP in normal range 130/80'S; blood sugars ranging 80-140'S after eating.  Denies any issues or concerns 4/27--Wife concerned about patients [facility] ([facility]  [facility].  Has had multiple falls, last one e fell on the wife; denies injuries.  Yesterday's vitals: 130/87, 95 % RA, HR 74, CBG ranges 130-150's. 5/16--still awaiting hospital bed and home health nursing/aide orders.  Reports patient is sleeping in recliner.  Still has wound to heel open to air per provider instruction.  BP 118/68, fasting blood sugars 130-140's.  Continues to receive home therapy. 5/25--Spoke with wife; still does not have hospital bed nor home health nurse/aide.  RNCM contacted [employer] for ordered DME (bed); spoke with JoCaryl Aspjo sent email to dispensing area location; awaiting call back to verify hold up with Hospital bed.  RNCM outreached to PCP  office spoke with Junious Dresser CMA who stated she will place order for HHNurse/Aide.  Wife also reports patient had to be lowered to floor for near fall today and BP was 80/60; stated BP was better.  Requested wife notify PCP with this near fall information; CMA Junious Dresser updated.  5/26--spoke with Melissa with [employer], they are needing more documentation and she has spoken with Kela CMA at office prior to bed being delivered.  Wife updated 6/1--SPOKE with wife, she confirms hospital bed delivary to home yesterday.  Has not heard about HH nurse/aide but expects HHPT vusit today.  RNCM can see orders in system, encouraged wife to ask therapist if he could see in their system.  RNCM Clinical Goal(s):  Patient will verbalize understanding of plan for management of HTN and DMII as evidenced by  checking blood pressures and blood sugars verbalize basic understanding of HTN and DMII disease process and self health management plan as evidenced by maintaining Hgb A1C of below 7.5  through collaboration with RN Care manager, provider, and care team.   Interventions: 1:1 collaboration with primary care provider regarding development and update of comprehensive plan of care as evidenced by provider attestation and co-signature Inter-disciplinary care team collaboration (see longitudinal plan of care) Evaluation of current treatment plan related to  self management and patient's adherence to plan as established by provider.  Diabetes:  (Status: Goal Met.) Long Term Goal   Lab Results  Component Value Date   HGBA1C 7.7 (A) 10/12/2021  Assessed patient's understanding of A1c goal: <8% Provided education to patient about basic DM disease process; Reviewed medications with patient and discussed importance of medication adherence;        Discussed plans with patient for ongoing care management follow up and provided patient with direct contact information for care management team;      Provided patient with written educational materials related to hypo and hyperglycemia and importance of correct treatment;       Advised patient, providing education and rationale, to check cbg once daily and when you have symptoms of low or high blood sugar and record        call provider for findings outside established parameters;       Reviewed current glucose ranges and discussed ways to keep them in range Congratulated on A1C maintaining below 8  Hypertension: (Status: Goal Met.)  Long Term Goal Last practice recorded BP readings:  BP Readings from Last 3 Encounters:  01/04/22 110/60  01/04/22 110/62  10/12/21 117/77  Most recent eGFR/CrCl:  Lab Results  Component Value Date   EGFR 65 04/28/2021    No components found for: "CRCL"  Evaluation of current treatment plan related to hypertension self  management and patient's adherence to plan as established by provider;   Provided education to patient re: stroke prevention, s/s of heart attack and stroke; Reviewed prescribed diet low salt carb modified Reviewed medications with patient and discussed importance of compliance;  Advised patient, providing education and rationale, to monitor blood pressure daily and record, calling PCP for findings outside established parameters;  Discussed complications of poorly controlled blood pressure such as heart disease, stroke, circulatory complications, vision complications, kidney impairment, sexual dysfunction;  Encouraged to keep blood pressure log Encouraged and instructed to take log to provider appointments Fall precautions and preventions reviewed and discussed Encouraged to obtain life alert Discussed and requested hhnurse, aide orders from PCP Instructed wife to notify provider of near fall  Patient Goals/Self-Care Activities: Take medications as prescribed  Attend all scheduled provider appointments Call provider office for new concerns or questions  check blood sugar at prescribed times: once daily check feet daily for cuts, sores or redness enter blood sugar readings and medication or insulin into daily log take the blood sugar log to all doctor visits check blood pressure 3 times per week write blood pressure results in a log or diary take blood pressure log to all doctor appointments call doctor for signs and symptoms of high blood pressure develop an action plan for high blood pressure keep all doctor appointments take medications for blood pressure exactly as prescribed report new symptoms to your doctor Slowly try to adjust sleep pattern        The patient verbalized understanding of instructions, educational materials, and care plan provided today and DECLINED offer to receive copy of patient instructions, educational materials, and care plan.  Telephone follow up  appointment with pharmacy team member scheduled for: 6 months  Edythe Clarity, [place], PharmD Clinical Pharmacist  [facility] [phone]

## 2022-03-27 DIAGNOSIS — I951 Orthostatic hypotension: Secondary | ICD-10-CM | POA: Diagnosis not present

## 2022-03-27 DIAGNOSIS — E039 Hypothyroidism, unspecified: Secondary | ICD-10-CM

## 2022-03-27 DIAGNOSIS — I10 Essential (primary) hypertension: Secondary | ICD-10-CM | POA: Diagnosis not present

## 2022-03-27 DIAGNOSIS — E1159 Type 2 diabetes mellitus with other circulatory complications: Secondary | ICD-10-CM

## 2022-03-27 DIAGNOSIS — Z794 Long term (current) use of insulin: Secondary | ICD-10-CM

## 2022-03-27 DIAGNOSIS — G451 Carotid artery syndrome (hemispheric): Secondary | ICD-10-CM | POA: Diagnosis not present

## 2022-04-07 ENCOUNTER — Other Ambulatory Visit: Payer: Self-pay

## 2022-04-07 DIAGNOSIS — I498 Other specified cardiac arrhythmias: Secondary | ICD-10-CM

## 2022-04-07 DIAGNOSIS — I25118 Atherosclerotic heart disease of native coronary artery with other forms of angina pectoris: Secondary | ICD-10-CM

## 2022-04-07 MED ORDER — CARVEDILOL 3.125 MG PO TABS: 3.1250 mg | ORAL_TABLET | Freq: Two times a day (BID) | ORAL | 2 refills | Status: DC | PRN

## 2022-04-12 ENCOUNTER — Ambulatory Visit: Payer: Medicare Other | Admitting: Family Medicine

## 2022-04-19 ENCOUNTER — Ambulatory Visit: Payer: Medicare Other | Admitting: Family Medicine

## 2022-04-22 ENCOUNTER — Encounter: Payer: Self-pay | Admitting: Family Medicine

## 2022-04-22 ENCOUNTER — Ambulatory Visit (INDEPENDENT_AMBULATORY_CARE_PROVIDER_SITE_OTHER): Payer: Medicare Other | Admitting: Family Medicine

## 2022-04-22 VITALS — BP 130/76 | HR 72 | Temp 97.8°F | Ht 68.0 in | Wt 178.8 lb

## 2022-04-22 DIAGNOSIS — R350 Frequency of micturition: Secondary | ICD-10-CM | POA: Diagnosis not present

## 2022-04-22 DIAGNOSIS — R600 Localized edema: Secondary | ICD-10-CM | POA: Insufficient documentation

## 2022-04-22 DIAGNOSIS — Z794 Long term (current) use of insulin: Secondary | ICD-10-CM

## 2022-04-22 DIAGNOSIS — Z23 Encounter for immunization: Secondary | ICD-10-CM | POA: Diagnosis not present

## 2022-04-22 DIAGNOSIS — L97529 Non-pressure chronic ulcer of other part of left foot with unspecified severity: Secondary | ICD-10-CM

## 2022-04-22 DIAGNOSIS — E11621 Type 2 diabetes mellitus with foot ulcer: Secondary | ICD-10-CM

## 2022-04-22 DIAGNOSIS — E1165 Type 2 diabetes mellitus with hyperglycemia: Secondary | ICD-10-CM | POA: Diagnosis not present

## 2022-04-22 DIAGNOSIS — G4700 Insomnia, unspecified: Secondary | ICD-10-CM

## 2022-04-22 LAB — POCT GLYCOSYLATED HEMOGLOBIN (HGB A1C): Hemoglobin A1C: 8 % — AB (ref 4.0–5.6)

## 2022-04-22 MED ORDER — FUROSEMIDE 20 MG PO TABS: 20.0000 mg | ORAL_TABLET | Freq: Every day | ORAL | 3 refills | Status: DC

## 2022-04-22 MED ORDER — DOXYCYCLINE HYCLATE 100 MG PO TABS: 100.0000 mg | ORAL_TABLET | Freq: Two times a day (BID) | ORAL | 0 refills | Status: DC

## 2022-04-22 NOTE — Assessment & Plan Note (Signed)
A1c 8.0.  Doing well with Lantus 15 units daily.  Not checking sugar at home.  Not had any symptomatic lows.  We will continue current regimen for now.  Follow-up again in 3 months.

## 2022-04-22 NOTE — Assessment & Plan Note (Signed)
Continue management per podiatry.  Ulcer on left foot seems to be healing well or as a new lesion on right foot.  We are treating with doxycycline as above.  Defer further management to podiatry.

## 2022-04-22 NOTE — Assessment & Plan Note (Addendum)
No red flags.  Has not had any improvement with conservative measures.  Symptoms are likely multifactorial.  We will start low-dose Lasix 20 mg as needed for swelling.  Hopefully this will help some with feeling of his diabetic ulcers as well.  He will follow with me in about 3 months.  We will repeat labs in a few weeks.

## 2022-04-22 NOTE — Assessment & Plan Note (Signed)
Symptoms are well controlled on gabapentin 200 mg nightly and Remeron 15 mg daily.

## 2022-04-22 NOTE — Progress Notes (Signed)
   Thomas [name] is a 88 y.o. male who presents today for an office visit.  Assessment/Plan:  New/Acute Problems: Urinary Frequency Likely due to BPH and hyperglycemia.  We will check urine culture today to rule out UTI.  Cellulitis  No signs of systemic illness.  We will start doxycycline.  Advised him to follow-up soon with podiatry for diabetic foot ulcer care.  Chronic Problems Addressed Today: Edema of foot No red flags.  Has not had any improvement with conservative measures.  Symptoms are likely multifactorial.  We will start low-dose Lasix 20 mg as needed for swelling.  Hopefully this will help some with feeling of his diabetic ulcers as well.  He will follow with me in about 3 months.  We will repeat labs in a few weeks.  Diabetic foot ulcer ([place]) Continue management per podiatry.  Ulcer on left foot seems to be healing well or as a new lesion on right foot.  We are treating with doxycycline as above.  Defer further management to podiatry.  Insomnia Symptoms are well controlled on gabapentin 200 mg nightly and Remeron 15 mg daily.  Type 2 diabetes mellitus with hyperglycemia ([name]) A1c 8.0.  Doing well with Lantus 15 units daily.  Not checking sugar at home.  Not had any symptomatic lows.  We will continue current regimen for now.  Follow-up again in 3 months.     Subjective:  HPI:  See A/p for status of chronic conditions.  Patient is here today with his son.  His main concern today is bilateral lower extremity swelling.  This is been persistent for the last several months to years but seems to be worsening recently.  He has been trying salt avoidance and leg elevation without much improvement.  Predominately located to bilateral lateral feet.  Is not much swelling in ankles or calves.  He does have a diabetic foot ulcer in his left heel that he has been following with podiatry for this seems to be healing however over the last several weeks he has noticed a new lesion pop  up on his right lateral ankle.  This area is very tender to palpation.  No fevers or chills.  Said some redness.  No purulent drainage.       Objective:  Physical Exam: BP 130/76   Pulse 72   Temp 97.8 F (36.6 C) (Temporal)   Ht 5\' 8"  (1.727 m)   Wt 178 lb 12.8 oz (81.1 kg)   SpO2 97%   BMI 27.19 kg/m   Wt Readings from Last 3 Encounters:  04/22/22 178 lb 12.8 oz (81.1 kg)  01/04/22 176 lb (79.8 kg)  01/04/22 174 lb (78.9 kg)    Gen: No acute distress, resting comfortably CV: Regular rate and rhythm with no murmurs appreciated Pulm: Normal work of breathing, clear to auscultation bilaterally with no crackles, wheezes, or rhonchi MSK: - Feet: Bilateral feet with 2+ edema to ankles.  Open sore approximately 1.5 cm in diameter on right lateral malleolus with surrounding erythema.  No purulent discharge. Neuro: Grossly normal, moves all extremities Psych: Normal affect and thought content      Thomas [name] M. [name], MD 04/22/2022 11:31 AM

## 2022-04-22 NOTE — Patient Instructions (Signed)
It was very nice to see you today!  Please start the doxycycline to clear out any infection in your ankle.  Use the Lasix as needed for foot swelling.  We will check a urine culture today.  Your blood pressure and blood sugar look good today.  No other medication changes.  I will see you back in 3 months to recheck your A1c.  Come back sooner if needed.  Take care, Dr [name]  PLEASE NOTE:  If you had any lab tests please let us know if you have not heard back within a few days. You may see your results on mychart before we have a chance to review them but we will give you a call once they are reviewed by [place]. If we ordered any referrals today, please let us know if you have not heard from their office within the next week.   Please try these tips to maintain a healthy lifestyle:  Eat at least 3 REAL meals and 1-2 snacks per day.  Aim for no more than 5 hours between eating.  If you eat breakfast, please do so within one hour of getting up.   Each meal should contain half fruits/vegetables, one quarter protein, and one quarter carbs (no bigger than a computer mouse)  Cut down on sweet beverages. This includes juice, soda, and sweet tea.   Drink at least 1 glass of water with each meal and aim for at least 8 glasses per day  Exercise at least 150 minutes every week.

## 2022-04-25 LAB — URINE CULTURE
MICRO NUMBER:: 14105395
SPECIMEN QUALITY:: ADEQUATE

## 2022-04-26 NOTE — Progress Notes (Signed)
Please inform patient of the following:  His urine culture confirms UTI. Recommend starting amoxicillin 875 mg twice daily x7 days.  Would like for him to let us know if symptoms or not improving.

## 2022-04-27 DIAGNOSIS — G451 Carotid artery syndrome (hemispheric): Secondary | ICD-10-CM | POA: Diagnosis not present

## 2022-04-27 DIAGNOSIS — I951 Orthostatic hypotension: Secondary | ICD-10-CM | POA: Diagnosis not present

## 2022-04-27 MED ORDER — AMOXICILLIN 875 MG PO TABS: 875.0000 mg | ORAL_TABLET | Freq: Two times a day (BID) | ORAL | 0 refills | Status: AC

## 2022-04-27 NOTE — Addendum Note (Signed)
Addended by: Bing Neighbors on: 04/27/2022 04:46 PM   Modules accepted: Orders

## 2022-04-28 LAB — CUP PACEART REMOTE DEVICE CHECK
Battery Remaining Longevity: 124 mo
Battery Voltage: 3.01 V
Brady Statistic AP VP Percent: 0.1 %
Brady Statistic AP VS Percent: 98.96 %
Brady Statistic AS VP Percent: 0 %
Brady Statistic AS VS Percent: 0.94 %
Brady Statistic RA Percent Paced: 99.06 %
Brady Statistic RV Percent Paced: 0.1 %
Date Time Interrogation Session: 20231031230856
Implantable Lead Connection Status: 753985
Implantable Lead Connection Status: 753985
Implantable Lead Implant Date: 20081106
Implantable Lead Implant Date: 20210210
Implantable Lead Location: 753859
Implantable Lead Location: 753860
Implantable Lead Model: 4076
Implantable Lead Model: 4076
Implantable Pulse Generator Implant Date: 20210210
Lead Channel Impedance Value: 266 Ohm
Lead Channel Impedance Value: 323 Ohm
Lead Channel Impedance Value: 342 Ohm
Lead Channel Impedance Value: 475 Ohm
Lead Channel Pacing Threshold Amplitude: 0.625 V
Lead Channel Pacing Threshold Amplitude: 0.875 V
Lead Channel Pacing Threshold Pulse Width: 0.4 ms
Lead Channel Pacing Threshold Pulse Width: 0.4 ms
Lead Channel Sensing Intrinsic Amplitude: 1 mV
Lead Channel Sensing Intrinsic Amplitude: 1 mV
Lead Channel Sensing Intrinsic Amplitude: 7.75 mV
Lead Channel Sensing Intrinsic Amplitude: 7.75 mV
Lead Channel Setting Pacing Amplitude: 1.5 V
Lead Channel Setting Pacing Amplitude: 2 V
Lead Channel Setting Pacing Pulse Width: 0.4 ms
Lead Channel Setting Sensing Sensitivity: 0.9 mV
Zone Setting Status: 755011
Zone Setting Status: 755011

## 2022-04-30 ENCOUNTER — Ambulatory Visit (INDEPENDENT_AMBULATORY_CARE_PROVIDER_SITE_OTHER): Payer: Medicare Other

## 2022-04-30 DIAGNOSIS — I495 Sick sinus syndrome: Secondary | ICD-10-CM

## 2022-05-10 DIAGNOSIS — H47011 Ischemic optic neuropathy, right eye: Secondary | ICD-10-CM | POA: Diagnosis not present

## 2022-05-10 DIAGNOSIS — H43813 Vitreous degeneration, bilateral: Secondary | ICD-10-CM | POA: Diagnosis not present

## 2022-05-10 DIAGNOSIS — H35033 Hypertensive retinopathy, bilateral: Secondary | ICD-10-CM | POA: Diagnosis not present

## 2022-05-10 DIAGNOSIS — H353231 Exudative age-related macular degeneration, bilateral, with active choroidal neovascularization: Secondary | ICD-10-CM | POA: Diagnosis not present

## 2022-05-10 NOTE — Progress Notes (Signed)
Remote pacemaker transmission.   

## 2022-05-17 ENCOUNTER — Other Ambulatory Visit: Payer: Self-pay | Admitting: Family Medicine

## 2022-05-24 DIAGNOSIS — H353231 Exudative age-related macular degeneration, bilateral, with active choroidal neovascularization: Secondary | ICD-10-CM | POA: Diagnosis not present

## 2022-05-24 DIAGNOSIS — H43813 Vitreous degeneration, bilateral: Secondary | ICD-10-CM | POA: Diagnosis not present

## 2022-05-24 DIAGNOSIS — H35033 Hypertensive retinopathy, bilateral: Secondary | ICD-10-CM | POA: Diagnosis not present

## 2022-05-24 DIAGNOSIS — H47011 Ischemic optic neuropathy, right eye: Secondary | ICD-10-CM | POA: Diagnosis not present

## 2022-05-27 DIAGNOSIS — I951 Orthostatic hypotension: Secondary | ICD-10-CM | POA: Diagnosis not present

## 2022-05-27 DIAGNOSIS — G451 Carotid artery syndrome (hemispheric): Secondary | ICD-10-CM | POA: Diagnosis not present

## 2022-06-03 ENCOUNTER — Telehealth: Payer: Self-pay | Admitting: Family Medicine

## 2022-06-03 NOTE — Telephone Encounter (Signed)
Copied from CRM [id number]. Topic: Medicare AWV >> Jun 03, 2022  9:07 AM Gwenith Spitz wrote: Reason for CRM: LVM PATIENT TO CALL [phone] TO SCHEDULE AWV WITH HEALTH COACH TINA

## 2022-06-10 ENCOUNTER — Telehealth: Payer: Self-pay | Admitting: Pharmacist

## 2022-06-10 NOTE — Progress Notes (Signed)
Chronic Care Management Pharmacy Assistant   Name: Thomas [name]  MRN: [id number] DOB: Dec 27, 1928   Reason for Encounter: Diabetes Adherence Call    Recent office visits:  04/22/2022 [facility] (PCP) Ardith Dark, MD; We will start doxycycline   Recent consult visits:  None  Hospital visits:  None in previous 6 months  Medications: Outpatient Encounter Medications as of 06/10/2022  Medication Sig   acetaminophen (TYLENOL) 325 MG tablet Take 650 mg by mouth every 6 (six) hours as needed for moderate pain or headache.   amiodarone (PACERONE) 200 MG tablet TAKE 1 TABLET(200 MG) BY MOUTH DAILY   apixaban (ELIQUIS) 5 MG TABS tablet Take 1 tablet (5 mg total) by mouth 2 (two) times daily.   B-D UF III MINI PEN NEEDLES 31G X 5 MM MISC USE 1 DAILY WITH LANTUS PEN INJECTIONS   carvedilol (COREG) 3.125 MG tablet Take 1 tablet (3.125 mg total) by mouth 2 (two) times daily as needed (take only if Systolic Blood Pressure greater than 140).   cholecalciferol (VITAMIN D3) 25 MCG (1000 UNIT) tablet Take 1,000 Units by mouth daily.   co-enzyme Q-10 50 MG capsule Take 50 mg by mouth daily.   doxycycline (VIBRA-TABS) 100 MG tablet Take 1 tablet (100 mg total) by mouth 2 (two) times daily.   ferrous sulfate 325 (65 FE) MG EC tablet TAKE 1 TABLET(325 MG) BY MOUTH DAILY WITH BREAKFAST (Patient taking differently: Take 325 mg by mouth daily with breakfast.)   furosemide (LASIX) 20 MG tablet Take 1 tablet (20 mg total) by mouth daily.   gabapentin (NEURONTIN) 100 MG capsule Take 2 capsules (200 mg total) by mouth at bedtime.   glucosamine-chondroitin 500-400 MG tablet Take 1 tablet by mouth 3 (three) times daily.   LANTUS SOLOSTAR 100 UNIT/ML Solostar Pen INJECT 13 UNITS INTO THE SKIN DAILY   levothyroxine (SYNTHROID) 100 MCG tablet TAKE 1 TABLET(100 MCG) BY MOUTH DAILY   lidocaine (LIDODERM) 5 % Place 1 patch onto the skin daily. Remove & Discard patch within 12 hours or as directed by MD   midodrine  (PROAMATINE) 2.5 MG tablet Take 1 tablet (2.5 mg total) by mouth 3 (three) times daily with meals.   mirtazapine (REMERON SOL-TAB) 15 MG disintegrating tablet DISSOLVE 1 TABLET(15 MG) ON THE TONGUE AT BEDTIME (Patient taking differently: Take 15 mg by mouth at bedtime.)   Multiple Vitamin (MULTIVITAMIN) tablet Take 1 tablet by mouth daily.   oxyCODONE-acetaminophen (PERCOCET) 10-325 MG tablet Take 1 tablet by mouth every 4 (four) hours as needed for pain.   predniSONE (STERAPRED UNI-PAK 21 TAB) 10 MG (21) TBPK tablet 6 day taper; take as directed on package instructions   Red Yeast Rice Extract (RED YEAST RICE PO) Take 1 capsule by mouth in the morning and at bedtime.   rosuvastatin (CRESTOR) 20 MG tablet Take 1 tablet (20 mg total) by mouth at bedtime.   tamsulosin (FLOMAX) 0.4 MG CAPS capsule Take 1 capsule (0.4 mg total) by mouth daily after supper.   No facility-administered encounter medications on file as of 06/10/2022.   Recent Relevant Labs: Lab Results  Component Value Date/Time   HGBA1C 8.0 (A) 04/22/2022 11:01 AM   HGBA1C 7.7 (A) 01/11/2022 11:18 AM   HGBA1C 7.4 (H) 03/16/2021 09:35 PM   HGBA1C 7.5 (H) 08/31/2020 05:23 AM   HGBA1C 7.7 12/03/2019 12:00 AM    Kidney Function Lab Results  Component Value Date/Time   CREATININE 1.46 (H) 10/10/2021 04:37 PM   CREATININE  1.08 04/28/2021 10:45 AM   CREATININE 1.16 08/27/2020 03:26 PM   GFR 52.56 (L) 08/21/2020 11:43 AM   GFRNONAA 45 (L) 10/10/2021 04:37 PM   GFRNONAA >60 08/27/2020 03:26 PM   GFRAA 66 08/07/2020 02:37 PM    Current antihyperglycemic regimen:  Lantus 15 units daily  What recent interventions/DTPs have been made to improve glycemic control:  No recent interventions or DTPs.  Have there been any recent hospitalizations or ED visits since last visit with CPP? No  Patient denies hypoglycemic symptoms.  Patient denies hyperglycemic symptoms.  How often are you checking your blood sugar? once daily  What  are your blood sugars ranging?  Fasting: 140's  During the week, how often does your blood glucose drop below 70? Never  Are you checking your feet daily/regularly? Yes  Adherence Review: Is the patient currently on a STATIN medication? Yes Is the patient currently on ACE/ARB medication? No Does the patient have >5 day gap between last estimated fill dates? No  -Patients wife states the patient has had a great last couple of weeks.   Care Gaps: Medicare Annual Wellness: Overdue - never done Ophthalmology Exam: Overdue - never done Foot Exam: Overdue - never done Hemoglobin A1C: 8.0% on 04/22/2022  Future Appointments  Date Time Provider Department Center  07/30/2022  7:30 AM [facility] DEVICE REMOTES [facility]  07/30/2022 11:00 AM Ardith Dark, MD [facility]  09/27/2022  2:00 PM [facility] CCM PHARMACIST [facility]  10/29/2022  7:30 AM [facility] DEVICE REMOTES [facility]  01/28/2023  7:30 AM [facility] DEVICE REMOTES [facility]  04/29/2023  7:30 AM [facility] DEVICE REMOTES [facility]   Star Rating Drugs: Lantus last filled 05/17/2022 100 DS Rosuvastatin 20 mg last filled 04/14/2022 90 DS  April D Calhoun, [facility] Clinical Pharmacist Assistant [phone]

## 2022-06-27 DIAGNOSIS — I951 Orthostatic hypotension: Secondary | ICD-10-CM | POA: Diagnosis not present

## 2022-06-27 DIAGNOSIS — G451 Carotid artery syndrome (hemispheric): Secondary | ICD-10-CM | POA: Diagnosis not present

## 2022-07-05 ENCOUNTER — Other Ambulatory Visit: Payer: Self-pay | Admitting: Family Medicine

## 2022-07-23 ENCOUNTER — Ambulatory Visit: Payer: Medicare Other | Admitting: Family Medicine

## 2022-07-27 ENCOUNTER — Telehealth: Payer: Self-pay | Admitting: Cardiology

## 2022-07-27 DIAGNOSIS — I480 Paroxysmal atrial fibrillation: Secondary | ICD-10-CM

## 2022-07-27 MED ORDER — APIXABAN 5 MG PO TABS: 5.0000 mg | ORAL_TABLET | Freq: Two times a day (BID) | ORAL | 1 refills | Status: DC

## 2022-07-27 NOTE — Telephone Encounter (Signed)
Prescription refill request for Eliquis received. Indication: Afib  Last office visit: 01/04/22 Thomas [name])  Scr: 1.46 (10/10/21)  Age: 89 Weight: 81.1kg  Appropriate dose. Refill sent.

## 2022-07-27 NOTE — Telephone Encounter (Signed)
*  STAT* If patient is at the pharmacy, call can be transferred to refill team.   1. Which medications need to be refilled? (please list name of each medication and dose if known) apixaban (ELIQUIS) 5 MG TABS tablet  2. Which pharmacy/location (including street and city if local pharmacy) is medication to be sent to? [id number] - [place], [place] - [address]  3. Do they need a 30 day or 90 day supply? 90  

## 2022-07-28 DIAGNOSIS — G451 Carotid artery syndrome (hemispheric): Secondary | ICD-10-CM | POA: Diagnosis not present

## 2022-07-28 DIAGNOSIS — I951 Orthostatic hypotension: Secondary | ICD-10-CM | POA: Diagnosis not present

## 2022-07-29 LAB — CUP PACEART REMOTE DEVICE CHECK
Battery Remaining Longevity: 121 mo
Battery Voltage: 3 V
Brady Statistic AP VP Percent: 0.06 %
Brady Statistic AP VS Percent: 99.07 %
Brady Statistic AS VP Percent: 0 %
Brady Statistic AS VS Percent: 0.87 %
Brady Statistic RA Percent Paced: 99.15 %
Brady Statistic RV Percent Paced: 0.06 %
Date Time Interrogation Session: 20240130002038
Implantable Lead Connection Status: 753985
Implantable Lead Connection Status: 753985
Implantable Lead Implant Date: 20081106
Implantable Lead Implant Date: 20210210
Implantable Lead Location: 753859
Implantable Lead Location: 753860
Implantable Lead Model: 4076
Implantable Lead Model: 4076
Implantable Pulse Generator Implant Date: 20210210
Lead Channel Impedance Value: 266 Ohm
Lead Channel Impedance Value: 323 Ohm
Lead Channel Impedance Value: 342 Ohm
Lead Channel Impedance Value: 513 Ohm
Lead Channel Pacing Threshold Amplitude: 0.625 V
Lead Channel Pacing Threshold Amplitude: 0.875 V
Lead Channel Pacing Threshold Pulse Width: 0.4 ms
Lead Channel Pacing Threshold Pulse Width: 0.4 ms
Lead Channel Sensing Intrinsic Amplitude: 1.25 mV
Lead Channel Sensing Intrinsic Amplitude: 1.25 mV
Lead Channel Sensing Intrinsic Amplitude: 8 mV
Lead Channel Sensing Intrinsic Amplitude: 8 mV
Lead Channel Setting Pacing Amplitude: 1.5 V
Lead Channel Setting Pacing Amplitude: 2 V
Lead Channel Setting Pacing Pulse Width: 0.4 ms
Lead Channel Setting Sensing Sensitivity: 0.9 mV
Zone Setting Status: 755011
Zone Setting Status: 755011

## 2022-07-30 ENCOUNTER — Ambulatory Visit (INDEPENDENT_AMBULATORY_CARE_PROVIDER_SITE_OTHER): Payer: Medicare Other | Admitting: Family Medicine

## 2022-07-30 ENCOUNTER — Ambulatory Visit: Payer: Medicare Other

## 2022-07-30 ENCOUNTER — Encounter: Payer: Self-pay | Admitting: Family Medicine

## 2022-07-30 VITALS — BP 124/76 | HR 61 | Temp 97.3°F | Ht 68.0 in | Wt 180.2 lb

## 2022-07-30 DIAGNOSIS — E1165 Type 2 diabetes mellitus with hyperglycemia: Secondary | ICD-10-CM | POA: Diagnosis not present

## 2022-07-30 DIAGNOSIS — Z794 Long term (current) use of insulin: Secondary | ICD-10-CM

## 2022-07-30 DIAGNOSIS — L570 Actinic keratosis: Secondary | ICD-10-CM | POA: Diagnosis not present

## 2022-07-30 DIAGNOSIS — I69351 Hemiplegia and hemiparesis following cerebral infarction affecting right dominant side: Secondary | ICD-10-CM

## 2022-07-30 DIAGNOSIS — E1169 Type 2 diabetes mellitus with other specified complication: Secondary | ICD-10-CM

## 2022-07-30 DIAGNOSIS — I639 Cerebral infarction, unspecified: Secondary | ICD-10-CM

## 2022-07-30 DIAGNOSIS — E1159 Type 2 diabetes mellitus with other circulatory complications: Secondary | ICD-10-CM

## 2022-07-30 DIAGNOSIS — K5900 Constipation, unspecified: Secondary | ICD-10-CM

## 2022-07-30 DIAGNOSIS — E785 Hyperlipidemia, unspecified: Secondary | ICD-10-CM

## 2022-07-30 DIAGNOSIS — I495 Sick sinus syndrome: Secondary | ICD-10-CM

## 2022-07-30 DIAGNOSIS — B351 Tinea unguium: Secondary | ICD-10-CM | POA: Diagnosis not present

## 2022-07-30 DIAGNOSIS — I152 Hypertension secondary to endocrine disorders: Secondary | ICD-10-CM

## 2022-07-30 LAB — COMPREHENSIVE METABOLIC PANEL
ALT: 11 U/L (ref 0–53)
AST: 14 U/L (ref 0–37)
Albumin: 4.1 g/dL (ref 3.5–5.2)
Alkaline Phosphatase: 63 U/L (ref 39–117)
BUN: 24 mg/dL — ABNORMAL HIGH (ref 6–23)
CO2: 28 mEq/L (ref 19–32)
Calcium: 9.3 mg/dL (ref 8.4–10.5)
Chloride: 105 mEq/L (ref 96–112)
Creatinine, Ser: 1.21 mg/dL (ref 0.40–1.50)
GFR: 52.37 mL/min — ABNORMAL LOW (ref >60.00)
Glucose, Bld: 161 mg/dL — ABNORMAL HIGH (ref 70–99)
Potassium: 4.4 mEq/L (ref 3.5–5.1)
Sodium: 141 mEq/L (ref 135–145)
Total Bilirubin: 0.3 mg/dL (ref 0.2–1.2)
Total Protein: 7.1 g/dL (ref 6.0–8.3)

## 2022-07-30 LAB — POCT GLYCOSYLATED HEMOGLOBIN (HGB A1C): Hemoglobin A1C: 8.1 % — AB (ref 4.0–5.6)

## 2022-07-30 MED ORDER — TERBINAFINE HCL 250 MG PO TABS: 250.0000 mg | ORAL_TABLET | Freq: Every day | ORAL | 0 refills | Status: DC

## 2022-07-30 NOTE — Progress Notes (Signed)
Thomas [name] is a 89 y.o. male who presents today for an office visit.  Assessment/Plan:  New/Acute Problems: Actinic Keratosis  No red flags.  We discussed treatment options including cryotherapy versus shave biopsy.  He elected to proceed with cryotherapy.  This was performed today.  See below procedure note.  He will let me know if not proving.  Will consider referral to dermatology or shave biopsy at that time.  Chronic Problems Addressed Today: Type 2 diabetes mellitus with hyperglycemia ([name]) A1c stable 8.1.  Continue Lantus 15 units daily.  Recheck in 3 months.  Onychomycosis We did discuss referral to podiatry however he deferred.  He is agreeable to start terbinafine.  Hopefully this will help thin his nails that way he can trim at home.  Will check [facility] today.  Constipation Still has ongoing issues with intermittent constipation.  Recommended half a dose of MiraLAX daily to have at least 1-2 soft bowel movements daily.  They can also use a stool softener.  Acute CVA (cerebrovascular accident) ([place]) Still has occasional right-sided weakness that comes and goes especially after waking up.  Does not currently have any symptoms.  Last episode of weakness was about 3 to 4 weeks ago.  We did discuss referral to neurology however deferred.  Will continue with watchful waiting.  He is anticoagulated on Eliquis and we are managing his other risk factors as above.  Hypertension associated with diabetes ([place]) Follows with cardiology.  Blood pressure is at goal today on combination of Coreg 3.125 mg twice daily and midodrine 2.5 mg 3 times daily.  Dyslipidemia due to type 2 diabetes mellitus ([name]) On Crestor 20 mg daily.  .     Subjective:  HPI:  See A/p for status of chronic conditions.   He has a skin lesion on his right face. This has been present for months but seems to be worsening.  Becomes irritated when he shaves the area.   Is also having some issues with thick and  elongated nails in his feet.  He has previously seen podiatry for this however does not wish to see them again.  He also has been to nail salon's however does not feel like they were effective in managing his toenails.       Objective:  Physical Exam: BP 124/76   Pulse 61   Temp (!) 97.3 F (36.3 C) (Temporal)   Ht 5\' 8"  (1.727 m)   Wt 180 lb 3.2 oz (81.7 kg)   SpO2 94%   BMI 27.40 kg/m   Gen: No acute distress, resting comfortably CV: Regular rate and rhythm with no murmurs appreciated Pulm: Normal work of breathing, clear to auscultation bilaterally with no crackles, wheezes, or rhonchi Skin: Hyperkeratotic pedunculated lesion on the right cheek.  Approximately 3 mm in diameter. MSK: -Feet: Bilateral great toes with onychomycosis.  No surrounding erythema. Neuro: Grossly normal, moves all extremities Psych: Normal affect and thought content  Cryotherapy Procedure Note  Pre-operative Diagnosis: Actinic keratosis  Locations: Right Face  Indications: Therapeutic  Procedure Details  Patient informed of risks (permanent scarring, infection, light or dark discoloration, bleeding, infection, weakness, numbness and recurrence of the lesion) and benefits of the procedure and verbal informed consent obtained.  The areas are treated with liquid nitrogen therapy, frozen until ice ball extended 3 mm beyond lesion, allowed to thaw, and treated again. The patient tolerated procedure well.  The patient was instructed on post-op care, warned that there may be blister formation, redness and  pain. Recommend OTC analgesia as needed for pain.  Condition: Stable  Complications: none.       [name]. [name], MD 07/30/2022 11:53 AM

## 2022-07-30 NOTE — Patient Instructions (Signed)
It was very nice to see you today!  We froze the spot on your face.  This should fall off in the next week or 2.  Let me know if not.  You have a fungal infection in your toenails that is causing the to be thick.  Please start the antifungal medication.  Let me know how this is doing for you in a couple of months.  Your A1c looks good.  No other medication changes.  I will see back in 3 to 6 months.  Come back sooner if needed.  Take care, Dr [name]  PLEASE NOTE:  If you had any lab tests, please let us know if you have not heard back within a few days. You may see your results on mychart before we have a chance to review them but we will give you a call once they are reviewed by [place].   If we ordered any referrals today, please let us know if you have not heard from their office within the next week.   If you had any urgent prescriptions sent in today, please check with the pharmacy within an hour of our visit to make sure the prescription was transmitted appropriately.   Please try these tips to maintain a healthy lifestyle:  Eat at least 3 REAL meals and 1-2 snacks per day.  Aim for no more than 5 hours between eating.  If you eat breakfast, please do so within one hour of getting up.   Each meal should contain half fruits/vegetables, one quarter protein, and one quarter carbs (no bigger than a computer mouse)  Cut down on sweet beverages. This includes juice, soda, and sweet tea.   Drink at least 1 glass of water with each meal and aim for at least 8 glasses per day  Exercise at least 150 minutes every week.

## 2022-07-30 NOTE — Assessment & Plan Note (Signed)
Follows with cardiology.  Blood pressure is at goal today on combination of Coreg 3.125 mg twice daily and midodrine 2.5 mg 3 times daily.

## 2022-07-30 NOTE — Assessment & Plan Note (Signed)
Still has occasional right-sided weakness that comes and goes especially after waking up.  Does not currently have any symptoms.  Last episode of weakness was about 3 to 4 weeks ago.  We did discuss referral to neurology however deferred.  Will continue with watchful waiting.  He is anticoagulated on Eliquis and we are managing his other risk factors as above.

## 2022-07-30 NOTE — Assessment & Plan Note (Signed)
Still has ongoing issues with intermittent constipation.  Recommended half a dose of MiraLAX daily to have at least 1-2 soft bowel movements daily.  They can also use a stool softener.

## 2022-07-30 NOTE — Assessment & Plan Note (Signed)
We did discuss referral to podiatry however he deferred.  He is agreeable to start terbinafine.  Hopefully this will help thin his nails that way he can trim at home.  Will check [facility] today.

## 2022-07-30 NOTE — Assessment & Plan Note (Signed)
On Crestor 20 mg daily.

## 2022-07-30 NOTE — Assessment & Plan Note (Signed)
A1c stable 8.1.  Continue Lantus 15 units daily.  Recheck in 3 months.

## 2022-08-03 ENCOUNTER — Other Ambulatory Visit: Payer: Self-pay

## 2022-08-03 DIAGNOSIS — Z79899 Other long term (current) drug therapy: Secondary | ICD-10-CM

## 2022-08-03 NOTE — Progress Notes (Signed)
Please inform patient of the following:  Liver numbers are stable.  It is okay for him to continue taking the antifungal medication I prescribed.  We should have him come back in 4 to 6 weeks to recheck.

## 2022-08-13 NOTE — Progress Notes (Unsigned)
Cardiology Office Note:    Date:  01/04/2022   ID:  Thomas [name], DOB Oct 28, 1928, MRN [id number]  PCP:  Vivi Barrack, MD   [place] [facility] Cardiologist:  Freada Bergeron, MD {    Referring MD: Vivi Barrack, MD    History of Present Illness:    Thomas [name] is a 89 y.o. male with a hx of  HLD, DMII, prior CVA, HTN, CAD s/p CABG in 2001, SSS s/p PPM, pulmonary embolism on warfarin, orthostatic hypotension and hypothyroidism who returns to clinic for follow-up.  Patient has a history of MI at age 70 that was managed medically at that time. He had subsequent PCIs and MI in 2001 leading to bypass surgery. He states he has had no chest pain or shortness of breath since his surgery. No stents placed after bypass surgery. Blood pressures fluctuate significantly at home from 90/50s to 200s/100s. Was seen in HTN clinic and where his clonidine was weaned, pindolol was changed to coreg; ramipril and chlorthalidone were continued at current doses. Also saw Dr. Lovena Le with EP to manage his medtronic PPM which was functioning well.    TTE showed LVEF 60-65%, moderate basal-septal hypertrophy, G1DD, dilated aorta/aortic root, moderate aortic sclerosis without stenosis, moderate MAC, mild MR.   Had recent fall where he was seen in ED in 07/2020. CT head negative for acute pathology. MRI head with remote strokes (known) but no acute pathology.   Was seen on 10/11/20 for syncope thought to be due to coreg. He was recommended for admission but he did not want to stay. His coreg was decreased.   Seen in clinic 11/27/20. Had been off of many of his blood pressure medications due to orthostasis. He was also on floinef at that time. At that time, his blood pressure was relatively stable. Given his risk of falls, his warfarin was changed to apixaban and his aspirin was stopped.     Was last seen in clinic on 01/04/22. He was having fluctuating blood pressures with both highs and lows. He  was on midodrine and coreg. Given multiple low blood pressures, his coreg was changed to only as needed for SBP>140.  Today, ***   Past Medical History:  Diagnosis Date   Arthritis    Clotting disorder ([place])    Coronary artery disease    Diabetes ([place])    Hyperlipidemia    Hypertension    Hypothyroidism 07/17/2020   Kidney disease    Loss of hearing    Macular degeneration    Myocardial infarction [facility])    Osteoarthritis    Stroke [facility])     Past Surgical History:  Procedure Laterality Date   CARPAL TUNNEL RELEASE  2011   CORONARY ANGIOPLASTY WITH STENT PLACEMENT     CORONARY ARTERY BYPASS GRAFT  2001   x6   REPLACEMENT TOTAL KNEE     SPINE SURGERY  2008   cervical and lumbar   THYROIDECTOMY      Current Medications: Current Meds  Medication Sig   acetaminophen (TYLENOL) 325 MG tablet Take 650 mg by mouth every 6 (six) hours as needed for moderate pain or headache.   amiodarone (PACERONE) 200 MG tablet Take 1 tablet (200 mg total) by mouth daily.   apixaban (ELIQUIS) 5 MG TABS tablet Take 1 tablet (5 mg total) by mouth 2 (two) times daily.   B-D UF III MINI PEN NEEDLES 31G X 5 MM MISC USE 1 DAILY WITH LANTUS PEN INJECTIONS   cholecalciferol (  VITAMIN D3) 25 MCG (1000 UNIT) tablet Take 1,000 Units by mouth daily.   co-enzyme Q-10 50 MG capsule Take 50 mg by mouth daily.   doxycycline (VIBRA-TABS) 100 MG tablet Take 1 tablet (100 mg total) by mouth 2 (two) times daily.   ferrous sulfate 325 (65 FE) MG EC tablet TAKE 1 TABLET(325 MG) BY MOUTH DAILY WITH BREAKFAST (Patient taking differently: Take 325 mg by mouth daily with breakfast.)   glucosamine-chondroitin 500-400 MG tablet Take 1 tablet by mouth 3 (three) times daily.   LANTUS SOLOSTAR 100 UNIT/ML Solostar Pen INJECT 13 UNITS INTO THE SKIN DAILY   levothyroxine (SYNTHROID) 100 MCG tablet TAKE 1 TABLET(100 MCG) BY MOUTH DAILY   lidocaine (LIDODERM) 5 % Place 1 patch onto the skin daily. Remove & Discard patch within 12  hours or as directed by MD   midodrine (PROAMATINE) 2.5 MG tablet Take 1 tablet (2.5 mg total) by mouth 3 (three) times daily with meals.   mirtazapine (REMERON SOL-TAB) 15 MG disintegrating tablet DISSOLVE 1 TABLET(15 MG) ON THE TONGUE AT BEDTIME (Patient taking differently: Take 15 mg by mouth at bedtime.)   Multiple Vitamin (MULTIVITAMIN) tablet Take 1 tablet by mouth daily.   oxyCODONE-acetaminophen (PERCOCET) 10-325 MG tablet Take 1 tablet by mouth every 4 (four) hours as needed for pain.   Red Yeast Rice Extract (RED YEAST RICE PO) Take 1 capsule by mouth in the morning and at bedtime.   rosuvastatin (CRESTOR) 20 MG tablet Take 1 tablet (20 mg total) by mouth at bedtime.   tamsulosin (FLOMAX) 0.4 MG CAPS capsule Take 1 capsule (0.4 mg total) by mouth daily after supper.   [DISCONTINUED] carvedilol (COREG) 3.125 MG tablet TAKE 1 TABLET(3.125 MG) BY MOUTH TWICE DAILY WITH A MEAL     Allergies:   Amitriptyline, Lyrica [pregabalin], Pravachol [pravastatin], Effexor xr [venlafaxine], Morphine, and Zolpidem   Social History   Socioeconomic History   Marital status: Married    Spouse name: Not on file   Number of children: Not on file   Years of education: Not on file   Highest education level: Not on file  Occupational History   Not on file  Tobacco Use   Smoking status: Former    Types: Pipe   Smokeless tobacco: Never  Vaping Use   Vaping Use: Never used  Substance and Sexual Activity   Alcohol use: Never   Drug use: Never   Sexual activity: Not on file  Other Topics Concern   Not on file  Social History Narrative   Not on file   Social Determinants of Health   Financial Resource Strain: Low Risk  (03/10/2021)   Overall Financial Resource Strain (CARDIA)    Difficulty of Paying Living Expenses: Not very hard  Food Insecurity: No Food Insecurity (05/14/2021)   Hunger Vital Sign    Worried About Running Out of Food in the Last Year: Never true    Ran Out of Food in the  Last Year: Never true  Transportation Needs: No Transportation Needs (05/14/2021)   PRAPARE - Hydrologist (Medical): No    Lack of Transportation (Non-Medical): No  Physical Activity: Not on file  Stress: Not on file  Social Connections: Not on file     Family History: The patient's family history includes Cancer in his brother and daughter; Heart disease in his brother, brother, and sister; Hypertension in his son; Stroke in his father and mother.  ROS:   Please see  the history of present illness. (+) LE Edema (+) Fatigue All other systems are reviewed and negative.     EKGs/Labs/Other Studies Reviewed:    The following studies were reviewed today:  CT 08/27/20: IMPRESSION: Atrophy with periventricular small vessel disease. Prior small infarcts in each thalamus as well as in the anterior limb of the left external capsule. No acute infarct is evident. No mass or hemorrhage.   There are foci of arterial vascular calcification. There are foci of paranasal sinus disease.   ECHO 08/07/2020:   IMPRESSIONS   1. Left ventricular ejection fraction, by estimation, is 60 to 65%. The  left ventricle has normal function. The left ventricle has no regional  wall motion abnormalities. There is moderate left ventricular hypertrophy  of the basal-septal segment. Left  ventricular diastolic parameters are consistent with Grade I diastolic  dysfunction (impaired relaxation). Elevated left ventricular end-diastolic  pressure.   2. Right ventricular systolic function is mildly reduced. The right  ventricular size is normal.   3. The mitral valve is degenerative. Mild mitral valve regurgitation. No  evidence of mitral stenosis. Moderate mitral annular calcification.   4. The aortic valve is normal in structure. There is mild calcification  of the aortic valve. There is mild thickening of the aortic valve. Aortic  valve regurgitation is not visualized. Mild to  moderate aortic valve  sclerosis/calcification is present,  without any evidence of aortic stenosis.   5. Aortic dilatation noted. There is mild dilatation of the aortic root,  measuring 40 mm. There is mild dilatation of the ascending aorta,  measuring 43 mm.  CT abdomen/pelvis: FINDINGS: Lower chest: Visualized lung bases are unremarkable. Moderate size sliding-type hiatal hernia is noted.   Hepatobiliary: No focal liver abnormality is seen. No gallstones, gallbladder wall thickening, or biliary dilatation.   Pancreas: Unremarkable. No pancreatic ductal dilatation or surrounding inflammatory changes.   Spleen: Normal in size without focal abnormality.   Adrenals/Urinary Tract: Adrenal glands are unremarkable. Kidneys are normal, without renal calculi, focal lesion, or hydronephrosis. Bladder is unremarkable.   Stomach/Bowel: There is no evidence of bowel obstruction or inflammation. Diverticulosis of descending and sigmoid colon is noted without inflammation. The appendix is not visualized.   Vascular/Lymphatic: Aortic atherosclerosis. No enlarged abdominal or pelvic lymph nodes.   Reproductive: Prostate is unremarkable.   Other: Small fat containing periumbilical hernia is noted. No ascites is noted.   Musculoskeletal: Multiple small lucencies are noted throughout the pelvis and visualized proximal femurs concerning for possible lytic lesions related to multiple myeloma or metastatic disease.   IMPRESSION: 1. Moderate size sliding-type hiatal hernia. 2. Diverticulosis of descending and sigmoid colon is noted without inflammation. 3. Small fat containing periumbilical hernia. 4. Multiple small lucencies are noted throughout the pelvis and visualized proximal femurs concerning for possible lytic lesions related to multiple myeloma or metastatic disease. MRI may be performed for further evaluation.   MRI 2020/08/27: FINDINGS: Brain: No evidence of acute infarct.  Remote infarct in the lateral right temporal lobe. Additional remote lacunar infarcts involving the bilateral thalami and bilateral cerebellar hemispheres. Additional small T2 hyperintensities in the basal ganglia most likely represent dilated perivascular spaces. There is advanced/confluent T2/FLAIR hyperintensities within the white matter and pons, most likely related to chronic microvascular ischemic disease. Punctate area of susceptibility artifact in the right cerebellar hemisphere, likely related to prior microhemorrhage. Mild generalized cerebral volume loss. No hydrocephalus. Cavum septum pellucidum et vergae, anatomic variant. No acute hemorrhage. No mass lesion or abnormal mass effect.  No extra-axial fluid collection.   Vascular: Major arterial flow voids are maintained at the skull base.   Skull and upper cervical spine: Normal marrow signal. Partially imaged upper cervical spine degenerative change.   Sinuses/Orbits: Right maxillary sinus retention cyst versus polyp. Scattered ethmoid air cell mucosal thickening without air-fluid levels. Unremarkable orbits.   Other: No sizable mastoid effusions.   IMPRESSION: 1. No evidence of acute intracranial abnormality.  No acute infarct. 2. Remote right temporal lobe and bilateral basal ganglia and cerebellar infarcts. 3. Advanced chronic microvascular ischemic disease.   TTE 08/07/20: IMPRESSIONS   1. Left ventricular ejection fraction, by estimation, is 60 to 65%. The  left ventricle has normal function. The left ventricle has no regional  wall motion abnormalities. There is moderate left ventricular hypertrophy  of the basal-septal segment. Left  ventricular diastolic parameters are consistent with Grade I diastolic  dysfunction (impaired relaxation). Elevated left ventricular end-diastolic  pressure.   2. Right ventricular systolic function is mildly reduced. The right  ventricular size is normal.   3. The mitral valve  is degenerative. Mild mitral valve regurgitation. No  evidence of mitral stenosis. Moderate mitral annular calcification.   4. The aortic valve is normal in structure. There is mild calcification  of the aortic valve. There is mild thickening of the aortic valve. Aortic  valve regurgitation is not visualized. Mild to moderate aortic valve  sclerosis/calcification is present,  without any evidence of aortic stenosis.   5. Aortic dilatation noted. There is mild dilatation of the aortic root,  measuring 40 mm. There is mild dilatation of the ascending aorta,  measuring 43 mm.   EKG:   01/04/2022: No new tracing 01/08/2021: Ekg not ordered today 11/27/2020: EKG is not ordered today.  Recent Labs: 03/23/2021: Magnesium 1.7 04/28/2021: ALT 22 06/11/2021: TSH 4.900 10/10/2021: BUN 23; Creatinine, Ser 1.46; Hemoglobin 12.7; Platelets 188; Potassium 4.4; Sodium 135  Recent Lipid Panel    Component Value Date/Time   CHOL 90 08/31/2020 0523   TRIG 108 08/31/2020 0523   HDL 31 (L) 08/31/2020 0523   CHOLHDL 2.9 08/31/2020 0523   VLDL 22 08/31/2020 0523   LDLCALC 37 08/31/2020 0523     Risk Assessment/Calculations:       Physical Exam:    VS:  BP 110/62   Pulse 69   Ht 5' 8"$  (1.727 m)   Wt 174 lb (78.9 kg)   SpO2 99%   BMI 26.46 kg/m     Wt Readings from Last 3 Encounters:  01/04/22 174 lb (78.9 kg)  10/12/21 160 lb (72.6 kg)  09/13/21 160 lb (72.6 kg)     GEN: Elderly male, hard of hearing HEENT: Normal NECK: No JVD; No carotid bruits CARDIAC: RRR, no murmurs, rubs, gallops RESPIRATORY:  Clear to auscultation without rales, wheezing or rhonchi  ABDOMEN: Soft, non-tender, non-distended MUSCULOSKELETAL:  No edema; No deformity  SKIN: Left foot ulceration NEUROLOGIC:  Alert and oriented x 3 PSYCHIATRIC:  Normal affect   ASSESSMENT:    1. Coronary artery disease of native artery of native heart with stable angina pectoris ([place])   2. Bigeminy   3. Sick sinus syndrome ([place])    4. Primary hypertension   5. PVC's (premature ventricular contractions)   6. Orthostatic hypotension   7. Paroxysmal atrial fibrillation ([name])   8. Coronary artery disease involving native coronary artery of native heart without angina pectoris   9. Cardiac pacemaker in situ   10. Hx of pulmonary embolus  11. Secondary hypercoagulable state ([place])     PLAN:    In order of problems listed above:  #Multivessel CAD s/p CABG in 2001: Patient with history of multiple MI's in the past with initial occurring at age 29. Had several subsequent PCIs (thinks he has 10 stents) and then suffered MI in 2001 prompting bypass surgery. Has been doing well post-bypass with no further chest pain or SOB. He has not required PCI since his bypass surgery. Currently doing well with no anginal symptoms. -TTE with LVEF 60-65%, no WMA -ASA stopped given need for [facility] and more frequent falls -Coreg stopped due to episodes of low blood pressure   #HTN: #Orthostatic Hypotension: Weaned off of medications due to frequent lows with syncope. Rather than have two medications aimed at doing opposite things (midodrine and coreg), will change coreg to prn for SBP>140. Overall, he is having more lows than highs and he is increased risk for falls.  -Continue midodrine 2.44m TID -Coreg changed to 3.1247mBID to prn for SBP>140 to avoid hypotension -Will tolerate higher pressures to avoid lows   #Suspected SSS s/p PPM placement: Medtronic PPM functioning well. -Follow-up with EP as scheduled  #Paroxysmal Afib: -Continue amiodarone 20021maily -Continue apixaban 5mg37mD -Follow-up with EP as scheduled   #Recent Stroke: Thought to be possibly secondary to low blood pressures. MRI with chronic ischemic changes.  -Continue crestor 20mg13mly -Stopped ASA given need for AC and high bleed risk   #HLD: -Continue crestor 20mg 64my   #DMII on insulin: -Management per PCP   #History of PE: Changed from warfarin to  apixaban. -Continue apixaban 5mg BI87m  [facility]  [facility]    Medication Adjustments/Labs and Tests Ordered: Current medicines are reviewed at length with the patient today.  Concerns regarding medicines are outlined above.  No orders of the defined types were placed in this encounter.  Meds ordered this encounter  Medications   carvedilol (COREG) 3.125 MG tablet    Sig: Take 1 tablet (3.125 mg total) by mouth 2 (two) times daily as needed (take only if Systolic Blood Pressure greater than 140).    Dispense:  180 tablet    Refill:  1    Patient Instructions  Medication Instructions:  Your physician has recommended you make the following change in your medication:   1-TAKE Carvedilol by mouth twice daily as needed for Systolic Blood Pressure greater than 140.   *If you need a refill on your cardiac medications before your next appointment, please call your pharmacy*  Lab Work: If you have labs (blood work) drawn today and your tests are completely normal, you will receive your results only by: MyChartCarbonu have MyChart) OR A paper copy in the mail If you have any lab test that is abnormal or we need to change your treatment, we will call you to review the results.  Follow-Up: At [facility] your health needs are our priority.  As part of our continuing mission to provide you with exceptional heart care, we have created designated Provider Care Teams.  These Care Teams include your primary Cardiologist (physician) and Advanced Practice Providers (APPs -  Physician Assistants and Nurse Practitioners) who all work together to provide you with the care you need, when you need it.  We recommend signing up for the patient portal called "MyChart".  Sign up information is provided on this After Visit Summary.  MyChart is used to connect  with patients for Virtual Visits (Telemedicine).  Patients are able to view lab/test results,  encounter notes, upcoming appointments, etc.  Non-urgent messages can be sent to your provider as well.   To learn more about what you can do with MyChart, go to [email or web address].    Your next appointment:   6 month(s)  The format for your next appointment:   In Person  Provider:   Freada Bergeron, MD {  Other Instructions   Important Information About Sugar

## 2022-08-16 ENCOUNTER — Ambulatory Visit: Payer: Medicare Other | Attending: Cardiology | Admitting: Cardiology

## 2022-08-16 ENCOUNTER — Encounter: Payer: Self-pay | Admitting: Cardiology

## 2022-08-16 ENCOUNTER — Other Ambulatory Visit: Payer: Self-pay | Admitting: Family Medicine

## 2022-08-16 VITALS — BP 120/84 | HR 72 | Ht 68.0 in | Wt 181.2 lb

## 2022-08-16 DIAGNOSIS — I25118 Atherosclerotic heart disease of native coronary artery with other forms of angina pectoris: Secondary | ICD-10-CM | POA: Diagnosis not present

## 2022-08-16 DIAGNOSIS — I480 Paroxysmal atrial fibrillation: Secondary | ICD-10-CM | POA: Diagnosis not present

## 2022-08-16 DIAGNOSIS — I10 Essential (primary) hypertension: Secondary | ICD-10-CM

## 2022-08-16 DIAGNOSIS — D6869 Other thrombophilia: Secondary | ICD-10-CM

## 2022-08-16 DIAGNOSIS — I495 Sick sinus syndrome: Secondary | ICD-10-CM

## 2022-08-16 DIAGNOSIS — I951 Orthostatic hypotension: Secondary | ICD-10-CM | POA: Diagnosis not present

## 2022-08-16 DIAGNOSIS — Z950 Presence of cardiac pacemaker: Secondary | ICD-10-CM

## 2022-08-16 NOTE — Patient Instructions (Signed)
Medication Instructions:   Your physician recommends that you continue on your current medications as directed. Please refer to the Current Medication list given to you today.  *If you need a refill on your cardiac medications before your next appointment, please call your pharmacy*    Follow-Up: At [facility], you and your health needs are our priority.  As part of our continuing mission to provide you with exceptional heart care, we have created designated Provider Care Teams.  These Care Teams include your primary Cardiologist (physician) and Advanced Practice Providers (APPs -  Physician Assistants and Nurse Practitioners) who all work together to provide you with the care you need, when you need it.  We recommend signing up for the patient portal called "MyChart".  Sign up information is provided on this After Visit Summary.  MyChart is used to connect with patients for Virtual Visits (Telemedicine).  Patients are able to view lab/test results, encounter notes, upcoming appointments, etc.  Non-urgent messages can be sent to your provider as well.   To learn more about what you can do with MyChart, go to [email or web address].    Your next appointment:   6 month(s)  Provider:   Freada Bergeron, MD

## 2022-08-18 NOTE — Progress Notes (Signed)
Remote pacemaker transmission.   

## 2022-08-26 DIAGNOSIS — I951 Orthostatic hypotension: Secondary | ICD-10-CM | POA: Diagnosis not present

## 2022-08-26 DIAGNOSIS — G451 Carotid artery syndrome (hemispheric): Secondary | ICD-10-CM | POA: Diagnosis not present

## 2022-08-30 ENCOUNTER — Other Ambulatory Visit (INDEPENDENT_AMBULATORY_CARE_PROVIDER_SITE_OTHER): Payer: Medicare Other

## 2022-08-30 DIAGNOSIS — Z79899 Other long term (current) drug therapy: Secondary | ICD-10-CM

## 2022-08-30 LAB — COMPREHENSIVE METABOLIC PANEL
ALT: 11 U/L (ref 0–53)
AST: 16 U/L (ref 0–37)
Albumin: 3.8 g/dL (ref 3.5–5.2)
Alkaline Phosphatase: 57 U/L (ref 39–117)
BUN: 26 mg/dL — ABNORMAL HIGH (ref 6–23)
CO2: 29 mEq/L (ref 19–32)
Calcium: 9.1 mg/dL (ref 8.4–10.5)
Chloride: 100 mEq/L (ref 96–112)
Creatinine, Ser: 1.26 mg/dL (ref 0.40–1.50)
GFR: 49.85 mL/min — ABNORMAL LOW (ref >60.00)
Glucose, Bld: 166 mg/dL — ABNORMAL HIGH (ref 70–99)
Potassium: 4.4 mEq/L (ref 3.5–5.1)
Sodium: 137 mEq/L (ref 135–145)
Total Bilirubin: 0.3 mg/dL (ref 0.2–1.2)
Total Protein: 6.8 g/dL (ref 6.0–8.3)

## 2022-08-31 ENCOUNTER — Other Ambulatory Visit: Payer: Self-pay | Admitting: Family Medicine

## 2022-09-01 NOTE — Progress Notes (Signed)
Please inform patient of the following:  Liver numbers are normal. He can continue the antifungal medication.  [name]. [name], MD 09/01/2022 10:32 AM

## 2022-09-22 ENCOUNTER — Telehealth: Payer: Self-pay | Admitting: Family Medicine

## 2022-09-22 NOTE — Telephone Encounter (Signed)
Contacted Jermere Meschke to schedule their annual wellness visit. Appointment made for 10/04/2022.  [place] Direct Dial [phone]

## 2022-09-27 ENCOUNTER — Encounter: Payer: Medicare Other | Admitting: Pharmacist

## 2022-09-27 NOTE — Progress Notes (Incomplete)
Care Management & Coordination Services Pharmacy Note  09/27/2022 Name:  Thomas [name] MRN:  [id number] DOB:  January 07, 1929  Summary: ***  Recommendations/Changes made from today's visit: ***  Follow up plan: ***   Subjective: Thomas [name] is an 89 y.o. year old male who is a primary patient of Thomas [name], [name], MD.  The care coordination team was consulted for assistance with disease management and care coordination needs.    Engaged with patient by telephone for follow up visit.  Recent office visits:  04/22/2022 OV (PCP) Vivi Barrack, MD; We will start doxycycline    Recent consult visits:  None   Hospital visits:  None in previous 6 months   Objective:  Lab Results  Component Value Date   CREATININE 1.26 08/30/2022   BUN 26 (H) 08/30/2022   GFR 49.85 (L) 08/30/2022   EGFR 65 04/28/2021   GFRNONAA 45 (L) 10/10/2021   GFRAA 66 08/07/2020   NA 137 08/30/2022   K 4.4 08/30/2022   CALCIUM 9.1 08/30/2022   CO2 29 08/30/2022   GLUCOSE 166 (H) 08/30/2022    Lab Results  Component Value Date/Time   HGBA1C 8.1 (A) 07/30/2022 11:59 AM   HGBA1C 8.0 (A) 04/22/2022 11:01 AM   HGBA1C 7.4 (H) 03/16/2021 09:35 PM   HGBA1C 7.5 (H) 08/31/2020 05:23 AM   HGBA1C 7.7 12/03/2019 12:00 AM   GFR 49.85 (L) 08/30/2022 01:16 PM   GFR 52.37 (L) 07/30/2022 12:00 PM    Last diabetic Eye exam: No results found for: "HMDIABEYEEXA"  Last diabetic Foot exam: No results found for: "HMDIABFOOTEX"   Lab Results  Component Value Date   CHOL 90 08/31/2020   HDL 31 (L) 08/31/2020   LDLCALC 37 08/31/2020   TRIG 108 08/31/2020   CHOLHDL 2.9 08/31/2020       Latest Ref Rng & Units 08/30/2022    1:16 PM 07/30/2022   12:00 PM 04/28/2021   10:45 AM  Hepatic Function  Total Protein 6.0 - 8.3 g/dL 6.8  7.1  6.5   Albumin 3.5 - 5.2 g/dL 3.8  4.1  3.8   AST 0 - 37 U/L 16  14  22    ALT 0 - 53 U/L 11  11  22    Alk Phosphatase 39 - 117 U/L 57  63  67   Total Bilirubin 0.2 - 1.2 mg/dL 0.3   0.3  0.3     Lab Results  Component Value Date/Time   TSH 4.900 (H) 06/11/2021 01:39 PM   TSH 4.530 (H) 04/28/2021 10:45 AM       Latest Ref Rng & Units 10/10/2021    4:37 PM 04/03/2021   11:50 AM 03/23/2021    2:31 AM  CBC  WBC 4.0 - 10.5 K/uL 12.1  10.3  11.5   Hemoglobin 13.0 - 17.0 g/dL 12.7  11.6  11.6   Hematocrit 39.0 - 52.0 % 38.9  35.4  34.6   Platelets 150 - 400 K/uL 188  229  151     Lab Results  Component Value Date/Time   VD25OH 46.27 07/17/2020 10:42 AM   VITAMINB12 477 07/17/2020 10:42 AM    Clinical ASCVD: {YES/NO:21197} The ASCVD Risk score (Arnett DK, et al., 2019) failed to calculate for the following reasons:   The 2019 ASCVD risk score is only valid for ages 63 to 75   The patient has a prior MI or stroke diagnosis    ***Other: (CHADS2VASc if Afib, MMRC or CAT for COPD, ACT,  DEXA)     07/30/2022   11:12 AM 04/22/2022   10:49 AM 01/11/2022   11:06 AM  Depression screen PHQ 2/9  Decreased Interest 0 0 0  Down, Depressed, Hopeless 0 0 0  PHQ - 2 Score 0 0 0     Social History   Tobacco Use  Smoking Status Former   Types: Pipe  Smokeless Tobacco Never   BP Readings from Last 3 Encounters:  08/16/22 120/84  07/30/22 124/76  04/22/22 130/76   Pulse Readings from Last 3 Encounters:  08/16/22 72  07/30/22 61  04/22/22 72   Wt Readings from Last 3 Encounters:  08/16/22 181 lb 3.2 oz (82.2 kg)  07/30/22 180 lb 3.2 oz (81.7 kg)  04/22/22 178 lb 12.8 oz (81.1 kg)   BMI Readings from Last 3 Encounters:  08/16/22 27.55 kg/m  07/30/22 27.40 kg/m  04/22/22 27.19 kg/m    Allergies  Allergen Reactions   Amitriptyline Shortness Of Breath and Palpitations   Lyrica [Pregabalin] Nausea And Vomiting   Pravachol [Pravastatin] Other (See Comments)    Muscle cramps and aches    Effexor Xr [Venlafaxine] Anxiety   Morphine Palpitations   Zolpidem Anxiety and Palpitations    Medications Reviewed Today     Reviewed by Ronie Spies, CMA  (Certified Medical Assistant) on 08/16/22 at [place] List Status: <None>   Medication Order Taking? Sig Documenting Provider Last Dose Status Informant  acetaminophen (TYLENOL) 325 MG tablet [id number] Yes Take 650 mg by mouth every 6 (six) hours as needed for moderate [name] or headache. [provider] Taking Active Self  amiodarone (PACERONE) 200 MG tablet [id number] Yes TAKE 1 TABLET(200 MG) BY MOUTH DAILY Loel Dubonnet, NP Taking Active   apixaban (ELIQUIS) 5 MG TABS tablet [id number] Yes Take 1 tablet (5 mg total) by mouth 2 (two) times daily. Freada Bergeron, MD Taking Active   B-D UF III MINI PEN NEEDLES 31G X 5 MM MISC [id number] Yes USE 1 DAILY WITH LANTUS PEN INJECTIONS Vivi Barrack, MD Taking Active Self  carvedilol (COREG) 3.125 MG tablet [id number] Yes Take 1 tablet (3.125 mg total) by mouth 2 (two) times daily as needed (take only if Systolic Blood Pressure greater than 140). Freada Bergeron, MD Taking Active   cholecalciferol (VITAMIN D3) 25 MCG (1000 UNIT) tablet [id number] Yes Take 1,000 Units by mouth daily. [provider] Taking Active Self  co-enzyme Q-10 50 MG capsule [id number] Yes Take 50 mg by mouth daily. [provider] Taking Active Self  ferrous sulfate 325 (65 FE) MG EC tablet [id number] Yes TAKE 1 TABLET(325 MG) BY MOUTH DAILY WITH BREAKFAST  Patient taking differently: Take 325 mg by mouth daily with breakfast.   Orson Slick, MD Taking Active Self  furosemide (LASIX) 20 MG tablet [id number] Yes TAKE 1 TABLET(20 MG) BY MOUTH DAILY Vivi Barrack, MD Taking Active   gabapentin (NEURONTIN) 100 MG capsule [id number] Yes TAKE 2 CAPSULES(200 MG) BY MOUTH AT BEDTIME Vivi Barrack, MD Taking Active   glucosamine-chondroitin 500-400 MG tablet [id number] Yes Take 1 tablet by mouth 3 (three) times daily. [provider] Taking Active Self  LANTUS SOLOSTAR 100 UNIT/ML Solostar Pen [id number] Yes INJECT 13 UNITS INTO THE SKIN DAILY  Vivi Barrack, MD Taking Active   levothyroxine (SYNTHROID) 100 MCG tablet [id number] Yes TAKE 1 TABLET(100 MCG) BY MOUTH DAILY Vivi Barrack, MD Taking Active   lidocaine (LIDODERM) 5 % [id number] Yes Place 1  patch onto the skin daily. Remove & Discard patch within 12 hours or as directed by MD Mar Daring, PA-C Taking Active   midodrine (PROAMATINE) 2.5 MG tablet [id number] Yes Take 1 tablet (2.5 mg total) by mouth 3 (three) times daily with meals. Freada Bergeron, MD Taking Active   mirtazapine (REMERON SOL-TAB) 15 MG disintegrating tablet [id number] Yes DISSOLVE 1 TABLET(15 MG) ON THE TONGUE AT BEDTIME  Patient taking differently: Take 15 mg by mouth at bedtime.   Vivi Barrack, MD Taking Active Self  Multiple Vitamin (MULTIVITAMIN) tablet [id number] Yes Take 1 tablet by mouth daily. [provider] Taking Active Self  oxyCODONE-acetaminophen (PERCOCET) 10-325 MG tablet [id number] Yes Take 1 tablet by mouth every 4 (four) hours as needed for [name]. Wallene Huh, DPM Taking Active   Red Yeast Rice Extract (RED YEAST RICE PO) [id number] Yes Take 1 capsule by mouth in the morning and at bedtime. [provider] Taking Active Self  rosuvastatin (CRESTOR) 20 MG tablet [id number] Yes Take 1 tablet (20 mg total) by mouth at bedtime. Freada Bergeron, MD Taking Active   tamsulosin [facility]) 0.4 MG CAPS capsule [id number] Yes Take 1 capsule (0.4 mg total) by mouth daily after supper. Vivi Barrack, MD Taking Active   terbinafine (LAMISIL) 250 MG tablet [id number] Yes Take 1 tablet (250 mg total) by mouth daily. Vivi Barrack, MD Taking Active             SDOH:  (Social Determinants of Health) assessments and interventions performed: {yes/no:20286} SDOH Interventions    Flowsheet Row Chronic Care Management from 05/14/2021 in [place] Interventions Intervention Not Indicated  Housing  Interventions Intervention Not Indicated  Transportation Interventions Intervention Not Indicated       Medication Assistance: {MEDASSISTANCEINFO:25044}  Medication Access: Within the past 30 days, how often has patient missed a dose of medication? *** Is a pillbox or other method used to improve adherence? {YES/NO:21197} Factors that may affect medication adherence? {CHL DESC; BARRIERS:21522} Are meds synced by current pharmacy? {YES/NO:21197} Are meds delivered by current pharmacy? {YES/NO:21197} Does patient experience delays in picking up medications due to transportation concerns? {YES/NO:21197}  [facility] Reviewed: Is patient disadvantaged to use [facility]?: {YES/NO:21197} Current Rx insurance plan: *** Name and location of Current pharmacy:  [facility], [place] - [address] [place] [address] [place] [address] [place] [place] [address] Phone: [phone] Fax: [phone]  [facility] services reviewed with patient today?: {YES/NO:21197} Patient requests to transfer care to [facility]?: {YES/NO:21197} Reason patient declined to change pharmacies: {US patient preference:27474}  Compliance/Adherence/Medication fill history: Care Gaps: ***  Star-Rating Drugs: ***   Assessment/Plan     Hypertension/Hx of TIA (BP goal <140/90) 03/22/22 -Controlled -Current treatment: Carvedilol 12.5mg  BID Appropriate, Effective, Safe, Accessible Eliquis 5mg  BID Appropriate, Effective, Safe, Accessible -Medications previously tried: ramipril, spironolactone  -Current home readings: no logs discussed -Current dietary habits: good appetite -Current exercise habits: doing physical therapy -Denies hypotensive/hypertensive symptoms -Educated on BP goals and benefits of medications for prevention of heart attack, stroke and kidney damage; Importance of home blood pressure monitoring; Symptoms of hypotension  and importance of maintaining adequate hydration; -Counseled to monitor BP at home daily, document, and provide log at future appointments -Continues to only take Coreg if BP is elevated per cardio. -Eliquis remains 5mg  BID - age is over 59 but  Scr and weight still not at threshold.  Scr is borderline continue to monitor.  Over 1.5 = dose reduction to 2.5mg  bid. Based on history I would leave as is for now.  Most recent Scr was 1.46  Update 09/07/21 BP controlled at home. His wife states he is doing well overall. Monitor dose of Eliquis - need to monitor Scr each time to make sure he does not need 2.5mg  BID since he is over 67 years old.  Currently weight and Scr are not at the requirements to lower dose. Will continue to monitor.  Diabetes (A1c goal <8) 03/22/22 -Controlled, last A1c 7.7% -Current medications: Lantus 13 units  Appropriate, Effective, Safe, Accessible -Medications previously tried: glimepiride  -Current home glucose readings fasting glucose: no logs discussed "normal range" post prandial glucose: N/A -Denies hypoglycemic/hyperglycemic symptoms -No glucose readings < 80 -Educated on A1c and blood sugar goals; Prevention and management of hypoglycemic episodes; -Due for foot and eye exam -Lantus now 13 units daily - continues to deny hypoglycemia. -At this point more concerned about hypo than anything else.  A1c goal < 8% acceptable. No changes needed at this time.  Update 09/07/21 A1c continues to be controlled. He is not having any hypoglycemia. Perfectly acceptable A1c goal < 8 due to age. Lantus still affordable - no changes at this time.  Hypothyroidism (Goal: Maintain TSH) -Controlled, not assessed -Current treatment  Levothyroxine 131mcg daily -Medications previously tried: none noted -Takes appropriately, TSH is normal  -Recommended to continue current medication  Insomnia (Goal: Adequate sleep) -Controlled, not assessed -Current treatment   Mirtazapine 7.5mg  daily -Medications previously tried: none -Sleep is reported as good for the most part, also added benefit of appetite stimulation with mirtazapine  -Recommended to continue current medication           Beverly Milch, PharmD Clinical Pharmacist  [facility] [phone]

## 2022-10-04 ENCOUNTER — Ambulatory Visit (INDEPENDENT_AMBULATORY_CARE_PROVIDER_SITE_OTHER): Payer: Medicare Other

## 2022-10-04 VITALS — Wt 181.0 lb

## 2022-10-04 DIAGNOSIS — Z0000 Encounter for general adult medical examination without abnormal findings: Secondary | ICD-10-CM | POA: Diagnosis not present

## 2022-10-04 NOTE — Patient Instructions (Signed)
Thomas [name] , Thank you for taking time to come for your Medicare Wellness Visit. I appreciate your ongoing commitment to your health goals. Please review the following plan we discussed and let me know if I can assist you in the future.   These are the goals we discussed:  Goals      Patient Stated     Stay healthy as I can      Set My Target A1C-Diabetes Type 2     Timeframe:  Long-Range Goal Priority:  High Start Date:   03/10/21                          Expected End Date: 09/07/21                      Follow Up Date 06/09/21    - set target A1C  Maintain A1c < 8.0, with no hypoglycemia    Why is this important?   Your target A1C is decided together by you and your doctor.  It is based on several things like your age and other health issues.    Notes:         This is a list of the screening recommended for you and due dates:  Health Maintenance  Topic Date Due   Complete foot exam   Never done   Eye exam for diabetics  Never done   DTaP/Tdap/Td vaccine (1 - Tdap) Never done   Pneumonia Vaccine (1 of 1 - PCV) Never done   Zoster (Shingles) Vaccine (2 of 2) 08/23/2012   Flu Shot  01/27/2023   Hemoglobin A1C  01/28/2023   Medicare Annual Wellness Visit  10/04/2023   HPV Vaccine  Aged Out   COVID-19 Vaccine  Discontinued    Advanced directives: Advance directive discussed with you today. Even though you declined this today please call our office should you change your mind and we can give you the proper paperwork for you to fill out.   Conditions/risks identified: stay healthy as I can   Next appointment: Follow up in one year for your annual wellness visit.   Preventive Care 36 Years and Older, Male  Preventive care refers to lifestyle choices and visits with your health care provider that can promote health and wellness. What does preventive care include? A yearly physical exam. This is also called an annual well check. Dental exams once or twice a  year. Routine eye exams. Ask your health care provider how often you should have your eyes checked. Personal lifestyle choices, including: Daily care of your teeth and gums. Regular physical activity. Eating a healthy diet. Avoiding tobacco and drug use. Limiting alcohol use. Practicing safe sex. Taking low doses of aspirin every day. Taking vitamin and mineral supplements as recommended by your health care provider. What happens during an annual well check? The services and screenings done by your health care provider during your annual well check will depend on your age, overall health, lifestyle risk factors, and family history of disease. Counseling  Your health care provider may ask you questions about your: Alcohol use. Tobacco use. Drug use. Emotional well-being. Home and relationship well-being. Sexual activity. Eating habits. History of falls. Memory and ability to understand (cognition). Work and work Astronomer. Screening  You may have the following tests or measurements: Height, weight, and BMI. Blood pressure. Lipid and cholesterol levels. These may be checked every 5 years, or more frequently if  you are over 87 years old. Skin check. Lung cancer screening. You may have this screening every year starting at age 87 if you have a 30-pack-year history of smoking and currently smoke or have quit within the past 15 years. Fecal occult blood test (FOBT) of the stool. You may have this test every year starting at age 87. Flexible sigmoidoscopy or colonoscopy. You may have a sigmoidoscopy every 5 years or a colonoscopy every 10 years starting at age 87. Prostate cancer screening. Recommendations will vary depending on your family history and other risks. Hepatitis C blood test. Hepatitis B blood test. Sexually transmitted disease (STD) testing. Diabetes screening. This is done by checking your blood sugar (glucose) after you have not eaten for a while (fasting). You may  have this done every 1-3 years. Abdominal aortic aneurysm (AAA) screening. You may need this if you are a current or former smoker. Osteoporosis. You may be screened starting at age 87 if you are at high risk. Talk with your health care provider about your test results, treatment options, and if necessary, the need for more tests. Vaccines  Your health care provider may recommend certain vaccines, such as: Influenza vaccine. This is recommended every year. Tetanus, diphtheria, and acellular pertussis (Tdap, Td) vaccine. You may need a Td booster every 10 years. Zoster vaccine. You may need this after age 87. Pneumococcal 13-valent conjugate (PCV13) vaccine. One dose is recommended after age 87. Pneumococcal polysaccharide (PPSV23) vaccine. One dose is recommended after age 865. Talk to your health care provider about which screenings and vaccines you need and how often you need them. This information is not intended to replace advice given to you by your health care provider. Make sure you discuss any questions you have with your health care provider. Document Released: 07/11/2015 Document Revised: 03/03/2016 Document Reviewed: 04/15/2015 Elsevier Interactive Patient Education  2017 [employer]  Fall Prevention in the Home Falls can cause injuries. They can happen to people of all ages. There are many things you can do to make your home safe and to help prevent falls. What can I do on the outside of my home? Regularly fix the edges of walkways and driveways and fix any cracks. Remove anything that might make you trip as you walk through a door, such as a raised step or threshold. Trim any bushes or trees on the path to your home. Use bright outdoor lighting. Clear any walking paths of anything that might make someone trip, such as rocks or tools. Regularly check to see if handrails are loose or broken. Make sure that both sides of any steps have handrails. Any raised decks and porches  should have guardrails on the edges. Have any leaves, snow, or ice cleared regularly. Use sand or salt on walking paths during winter. Clean up any spills in your garage right away. This includes oil or grease spills. What can I do in the bathroom? Use night lights. Install grab bars by the toilet and in the tub and shower. Do not use towel bars as grab bars. Use non-skid mats or decals in the tub or shower. If you need to sit down in the shower, use a plastic, non-slip stool. Keep the floor dry. Clean up any water that spills on the floor as soon as it happens. Remove soap buildup in the tub or shower regularly. Attach bath mats securely with double-sided non-slip rug tape. Do not have throw rugs and other things on the floor that can make you  trip. What can I do in the bedroom? Use night lights. Make sure that you have a light by your bed that is easy to reach. Do not use any sheets or blankets that are too big for your bed. They should not hang down onto the floor. Have a firm chair that has side arms. You can use this for support while you get dressed. Do not have throw rugs and other things on the floor that can make you trip. What can I do in the kitchen? Clean up any spills right away. Avoid walking on wet floors. Keep items that you use a lot in easy-to-reach places. If you need to reach something above you, use a strong step stool that has a grab bar. Keep electrical cords out of the way. Do not use floor polish or wax that makes floors slippery. If you must use wax, use non-skid floor wax. Do not have throw rugs and other things on the floor that can make you trip. What can I do with my stairs? Do not leave any items on the stairs. Make sure that there are handrails on both sides of the stairs and use them. Fix handrails that are broken or loose. Make sure that handrails are as long as the stairways. Check any carpeting to make sure that it is firmly attached to the stairs.  Fix any carpet that is loose or worn. Avoid having throw rugs at the top or bottom of the stairs. If you do have throw rugs, attach them to the floor with carpet tape. Make sure that you have a light switch at the top of the stairs and the bottom of the stairs. If you do not have them, ask someone to add them for you. What else can I do to help prevent falls? Wear shoes that: Do not have high heels. Have rubber bottoms. Are comfortable and fit you well. Are closed at the toe. Do not wear sandals. If you use a stepladder: Make sure that it is fully opened. Do not climb a closed stepladder. Make sure that both sides of the stepladder are locked into place. Ask someone to hold it for you, if possible. Clearly mark and make sure that you can see: Any grab bars or handrails. First and last steps. Where the edge of each step is. Use tools that help you move around (mobility aids) if they are needed. These include: Canes. Walkers. Scooters. Crutches. Turn on the lights when you go into a dark area. Replace any light bulbs as soon as they burn out. Set up your furniture so you have a clear path. Avoid moving your furniture around. If any of your floors are uneven, fix them. If there are any pets around you, be aware of where they are. Review your medicines with your doctor. Some medicines can make you feel dizzy. This can increase your chance of falling. Ask your doctor what other things that you can do to help prevent falls. This information is not intended to replace advice given to you by your health care provider. Make sure you discuss any questions you have with your health care provider. Document Released: 04/10/2009 Document Revised: 11/20/2015 Document Reviewed: 07/19/2014 Elsevier Interactive Patient Education  2017 [employer].

## 2022-10-04 NOTE — Progress Notes (Signed)
I connected with  Thomas [name] on 10/04/22 by a audio enabled telemedicine application and verified that I am speaking with the correct person using two identifiers.along with wife Thomas [name]  Patient Location: Home  Provider Location: Office/Clinic  I discussed the limitations of evaluation and management by telemedicine. The patient expressed understanding and agreed to proceed.   Subjective:   Thomas [name] is a 89 y.o. male who presents for an Initial Medicare Annual Wellness Visit.  Review of Systems     Cardiac Risk Factors include: advanced age (>47men, >66 women);male gender;dyslipidemia;diabetes mellitus;hypertension;sedentary lifestyle     Objective:    Today's Vitals   10/04/22 1533  Weight: 181 lb (82.1 kg)   Body mass index is 27.52 kg/m.     10/04/2022    3:40 PM 10/10/2021    3:37 PM 09/13/2021    3:59 AM 05/14/2021    1:14 PM 03/17/2021    7:36 PM 03/16/2021    5:04 PM 10/11/2020    1:08 PM  Advanced Directives  Does Patient Have a Medical Advance Directive? No No No No  No Yes  Type of Advance Directive       Healthcare Power of Attorney  Would patient like information on creating a medical advance directive? No - Patient declined Yes (ED - send information to MyChart)  No - Patient declined No - Patient declined  Yes (ED - Information included in AVS)    Current Medications (verified) Outpatient Encounter Medications as of 10/04/2022  Medication Sig   acetaminophen (TYLENOL) 325 MG tablet Take 650 mg by mouth every 6 (six) hours as needed for moderate pain or headache.   amiodarone (PACERONE) 200 MG tablet TAKE 1 TABLET(200 MG) BY MOUTH DAILY   apixaban (ELIQUIS) 5 MG TABS tablet Take 1 tablet (5 mg total) by mouth 2 (two) times daily.   B-D UF III MINI PEN NEEDLES 31G X 5 MM MISC USE 1 DAILY WITH LANTUS PEN INJECTIONS   carvedilol (COREG) 3.125 MG tablet Take 1 tablet (3.125 mg total) by mouth 2 (two) times daily as needed (take only if Systolic Blood  Pressure greater than 140).   cholecalciferol (VITAMIN D3) 25 MCG (1000 UNIT) tablet Take 1,000 Units by mouth daily.   co-enzyme Q-10 50 MG capsule Take 50 mg by mouth daily.   furosemide (LASIX) 20 MG tablet TAKE 1 TABLET(20 MG) BY MOUTH DAILY   gabapentin (NEURONTIN) 100 MG capsule TAKE 2 CAPSULES(200 MG) BY MOUTH AT BEDTIME   glucosamine-chondroitin 500-400 MG tablet Take 1 tablet by mouth 3 (three) times daily.   LANTUS SOLOSTAR 100 UNIT/ML Solostar Pen INJECT 13 UNITS INTO THE SKIN DAILY   levothyroxine (SYNTHROID) 100 MCG tablet TAKE 1 TABLET(100 MCG) BY MOUTH DAILY   midodrine (PROAMATINE) 2.5 MG tablet Take 1 tablet (2.5 mg total) by mouth 3 (three) times daily with meals.   mirtazapine (REMERON SOL-TAB) 15 MG disintegrating tablet DISSOLVE 1 TABLET(15 MG) ON THE TONGUE AT BEDTIME (Patient taking differently: Take 15 mg by mouth at bedtime.)   Multiple Vitamin (MULTIVITAMIN) tablet Take 1 tablet by mouth daily.   Red Yeast Rice Extract (RED YEAST RICE PO) Take 1 capsule by mouth in the morning and at bedtime.   rosuvastatin (CRESTOR) 20 MG tablet Take 1 tablet (20 mg total) by mouth at bedtime.   tamsulosin (FLOMAX) 0.4 MG CAPS capsule TAKE 1 CAPSULE(0.4 MG) BY MOUTH DAILY AFTER SUPPER   terbinafine (LAMISIL) 250 MG tablet Take 1 tablet (250 mg total) by mouth  daily.   [DISCONTINUED] ferrous sulfate 325 (65 FE) MG EC tablet TAKE 1 TABLET(325 MG) BY MOUTH DAILY WITH BREAKFAST (Patient taking differently: Take 325 mg by mouth daily with breakfast.)   [DISCONTINUED] lidocaine (LIDODERM) 5 % Place 1 patch onto the skin daily. Remove & Discard patch within 12 hours or as directed by MD   [DISCONTINUED] oxyCODONE-acetaminophen (PERCOCET) 10-325 MG tablet Take 1 tablet by mouth every 4 (four) hours as needed for pain.   No facility-administered encounter medications on file as of 10/04/2022.    Allergies (verified) Amitriptyline, Lyrica [pregabalin], Pravachol [pravastatin], Effexor xr  [venlafaxine], Morphine, and Zolpidem   History: Past Medical History:  Diagnosis Date   Arthritis    Clotting disorder    Coronary artery disease    Diabetes    Hyperlipidemia    Hypertension    Hypothyroidism 07/17/2020   Kidney disease    Loss of hearing    Macular degeneration    Myocardial infarction    Osteoarthritis    Stroke    Past Surgical History:  Procedure Laterality Date   CARPAL TUNNEL RELEASE  2011   CORONARY ANGIOPLASTY WITH STENT PLACEMENT     CORONARY ARTERY BYPASS GRAFT  2001   x6   REPLACEMENT TOTAL KNEE     SPINE SURGERY  2008   cervical and lumbar   THYROIDECTOMY     Family History  Problem Relation Age of Onset   Stroke Mother    Stroke Father    Heart disease Sister    Cancer Daughter    Hypertension Son    Heart disease Brother    Cancer Brother    Heart disease Brother    Social History   Socioeconomic History   Marital status: Married    Spouse name: Not on file   Number of children: Not on file   Years of education: Not on file   Highest education level: Not on file  Occupational History   Not on file  Tobacco Use   Smoking status: Former    Types: Pipe   Smokeless tobacco: Never  Vaping Use   Vaping Use: Never used  Substance and Sexual Activity   Alcohol use: Never   Drug use: Never   Sexual activity: Not on file  Other Topics Concern   Not on file  Social History Narrative   Not on file   Social Determinants of Health   Financial Resource Strain: Low Risk  (10/04/2022)   Overall Financial Resource Strain (CARDIA)    Difficulty of Paying Living Expenses: Not hard at all  Food Insecurity: No Food Insecurity (10/04/2022)   Hunger Vital Sign    Worried About Running Out of Food in the Last Year: Never true    Ran Out of Food in the Last Year: Never true  Transportation Needs: No Transportation Needs (10/04/2022)   PRAPARE - Administrator, Civil Service (Medical): No    Lack of Transportation (Non-Medical):  No  Physical Activity: Insufficiently Active (10/04/2022)   Exercise Vital Sign    Days of Exercise per Week: 7 days    Minutes of Exercise per Session: 10 min  Stress: No Stress Concern Present (10/04/2022)   [employer] of Occupational Health - Occupational Stress Questionnaire    Feeling of Stress : Not at all  Social Connections: Moderately Integrated (10/04/2022)   Social Connection and Isolation Panel [NHANES]    Frequency of Communication with Friends and Family: More than three times a week  Frequency of Social Gatherings with Friends and Family: Once a week    Attends Religious Services: 1 to 4 times per year    Active Member of [employer] or Organizations: No    Attends Engineer, structural: Never    Marital Status: Married    Tobacco Counseling Counseling given: Not Answered   Clinical Intake:  Pre-visit preparation completed: Yes  Pain : No/denies pain     BMI - recorded: 27.52 Nutritional Status: BMI 25 -29 Overweight Nutritional Risks: None Diabetes: Yes CBG done?: Yes (164 per pt wife) CBG resulted in Enter/ Edit results?: No Did pt. bring in CBG monitor from home?: No  How often do you need to have someone help you when you read instructions, pamphlets, or other written materials from your doctor or pharmacy?: 1 - Never  Diabetic?Nutrition Risk Assessment:  Has the patient had any N/V/D within the last 2 months?  No  Does the patient have any non-healing wounds?  No  Has the patient had any unintentional weight loss or weight gain?  No   Diabetes:  Is the patient diabetic?  Yes  If diabetic, was a CBG obtained today?  Yes  Did the patient bring in their glucometer from home?  No  How often do you monitor your CBG's? daily.   Financial Strains and Diabetes Management:  Are you having any financial strains with the device, your supplies or your medication? No .  Does the patient want to be seen by Chronic Care Management for management of  their diabetes?  No  Would the patient like to be referred to a Nutritionist or for Diabetic Management?  No   Diabetic Exams:  Diabetic Eye Exam: Overdue for diabetic eye exam. Pt has been advised about the importance in completing this exam. Patient advised to call and schedule an eye exam. Diabetic Foot Exam: Overdue, Pt has been advised about the importance in completing this exam. Pt is scheduled for diabetic foot exam on next appt .   Interpreter Needed?: No  Information entered by :: Lanier Ensign, LPN   Activities of Daily Living    10/04/2022    3:41 PM  In your present state of health, do you have any difficulty performing the following activities:  Hearing? 1  Comment HEARING AIDS  Vision? 0  Difficulty concentrating or making decisions? 0  Walking or climbing stairs? 0  Dressing or bathing? 1  Comment wife  Doing errands, shopping? 0  Preparing Food and eating ? Y  Comment wife  Using the Toilet? N  In the past six months, have you accidently leaked urine? N  Do you have problems with loss of bowel control? N  Managing your Medications? N  Managing your Finances? N  Housekeeping or managing your Housekeeping? N    Patient Care Team: Ardith Dark, MD as PCP - General (Family Medicine) Meriam Sprague, MD as PCP - Cardiology (Cardiology) Erroll Luna, [facility] as Pharmacist (Pharmacist)  Indicate any recent Medical Services you may have received from other than [facility] providers in the past year (date may be approximate).     Assessment:   This is a routine wellness examination for Thomas [name].  Hearing/Vision screen Hearing Screening - Comments:: Wears hearing aids  Vision Screening - Comments:: Pt follows up with Dr Lelan Pons for annual  eye exAMS   Dietary issues and exercise activities discussed: Current Exercise Habits: Home exercise routine, Type of exercise: Other - see comments (chair exercise), Time (Minutes):  10, Frequency (Times/Week): 7, Weekly  Exercise (Minutes/Week): 70   Goals Addressed             This Visit's Progress    Patient Stated       Stay healthy as I can        Depression Screen    10/04/2022    3:39 PM 07/30/2022   11:12 AM 04/22/2022   10:49 AM 01/11/2022   11:06 AM 01/04/2022    3:01 PM 05/14/2021    1:25 PM 10/16/2020    1:55 PM  PHQ 2/9 Scores  PHQ - 2 Score 0 0 0 0 0 0 0    Fall Risk    10/04/2022    3:41 PM 07/30/2022   11:12 AM 04/22/2022   10:50 AM 01/11/2022   11:06 AM 01/04/2022    3:01 PM  Fall Risk   Falls in the past year? 1 1 0 0 0  Number falls in past yr: 1 0 0 0 0  Injury with Fall? 0 0 0 0 0  Risk for fall due to : Impaired balance/gait;Impaired mobility;Impaired vision No Fall Risks No Fall Risks No Fall Risks No Fall Risks  Risk for fall due to: Comment USES WALKER      Follow up Falls prevention discussed        FALL RISK PREVENTION PERTAINING TO THE HOME:  Any stairs in or around the home? No  If so, are there any without handrails? No  Home free of loose throw rugs in walkways, pet beds, electrical cords, etc? Yes  Adequate lighting in your home to reduce risk of falls? Yes   ASSISTIVE DEVICES UTILIZED TO PREVENT FALLS:  Life alert? No  Use of a cane, walker or w/c? Yes  Grab bars in the bathroom? Yes  Shower chair or bench in shower? Yes  Elevated toilet seat or a handicapped toilet? Yes   TIMED UP AND GO:  Was the test performed? No .  Cognitive Function:declined         Immunizations Immunization History  Administered Date(s) Administered   Fluad Quad(high Dose 65+) 04/22/2022   Influenza-Unspecified 03/17/2020   Moderna Sars-Covid-2 Vaccination 09/04/2019, 10/03/2019   Pneumococcal Conjugate-13 05/27/2015   Zoster Recombinat (Shingrix) 03/27/2012, 06/28/2012    TDAP status: Due, Education has been provided regarding the importance of this vaccine. Advised may receive this vaccine at local pharmacy or Health Dept. Aware to provide a copy of the  vaccination record if obtained from local pharmacy or Health Dept. Verbalized acceptance and understanding.  Flu Vaccine status: Up to date  Pneumococcal vaccine status: Due, Education has been provided regarding the importance of this vaccine. Advised may receive this vaccine at local pharmacy or Health Dept. Aware to provide a copy of the vaccination record if obtained from local pharmacy or Health Dept. Verbalized acceptance and understanding.  Covid-19 vaccine status: Completed vaccines  Qualifies for Shingles Vaccine? Yes   Zostavax completed Yes   Shingrix Completed?: Yes  Screening Tests Health Maintenance  Topic Date Due   FOOT EXAM  Never done   OPHTHALMOLOGY EXAM  Never done   DTaP/Tdap/Td (1 - Tdap) Never done   Pneumonia Vaccine 5865+ Years old (2 of 2 - PPSV23 or PCV20) 05/26/2016   INFLUENZA VACCINE  01/27/2023   HEMOGLOBIN A1C  01/28/2023   Medicare Annual Wellness (AWV)  10/04/2023   Zoster Vaccines- Shingrix  Completed   HPV VACCINES  Aged Out   COVID-19 Vaccine  Discontinued  Health Maintenance  Health Maintenance Due  Topic Date Due   FOOT EXAM  Never done   OPHTHALMOLOGY EXAM  Never done   DTaP/Tdap/Td (1 - Tdap) Never done   Pneumonia Vaccine 63+ Years old (2 of 2 - PPSV23 or PCV20) 05/26/2016    Colorectal cancer screening: No longer required.    Additional Screening:  Vision Screening: Recommended annual ophthalmology exams for early detection of glaucoma and other disorders of the eye. Is the patient up to date with their annual eye exam?  No  Who is the provider or what is the name of the office in which the patient attends annual eye exams? Encouraged to follow up with Dr Lelan Pons  If pt is not established with a provider, would they like to be referred to a provider to establish care? No .   Dental Screening: Recommended annual dental exams for proper oral hygiene  Community Resource Referral / Chronic Care Management: CRR required this  visit?  No   CCM required this visit?  No      Plan:     I have personally reviewed and noted the following in the patient's chart:   Medical and social history Use of alcohol, tobacco or illicit drugs  Current medications and supplements including opioid prescriptions. Patient is not currently taking opioid prescriptions. Functional ability and status Nutritional status Physical activity Advanced directives List of other physicians Hospitalizations, surgeries, and ER visits in previous 12 months Vitals Screenings to include cognitive, depression, and falls Referrals and appointments  In addition, I have reviewed and discussed with patient certain preventive protocols, quality metrics, and best practice recommendations. A written personalized care plan for preventive services as well as general preventive health recommendations were provided to patient.     Thomas Schlein, LPN   4/0/9811   Nurse Notes: pt declined cognition testing due to very HOH wife stated his mind is better than hers.

## 2022-10-15 ENCOUNTER — Other Ambulatory Visit: Payer: Self-pay | Admitting: *Deleted

## 2022-10-15 ENCOUNTER — Other Ambulatory Visit: Payer: Self-pay | Admitting: Family Medicine

## 2022-10-15 DIAGNOSIS — E1165 Type 2 diabetes mellitus with hyperglycemia: Secondary | ICD-10-CM

## 2022-10-15 DIAGNOSIS — Z950 Presence of cardiac pacemaker: Secondary | ICD-10-CM

## 2022-10-15 DIAGNOSIS — I25119 Atherosclerotic heart disease of native coronary artery with unspecified angina pectoris: Secondary | ICD-10-CM

## 2022-10-15 DIAGNOSIS — I2510 Atherosclerotic heart disease of native coronary artery without angina pectoris: Secondary | ICD-10-CM

## 2022-10-15 DIAGNOSIS — Z79899 Other long term (current) drug therapy: Secondary | ICD-10-CM

## 2022-10-15 DIAGNOSIS — E1159 Type 2 diabetes mellitus with other circulatory complications: Secondary | ICD-10-CM

## 2022-10-15 MED ORDER — ROSUVASTATIN CALCIUM 20 MG PO TABS
20.0000 mg | ORAL_TABLET | Freq: Every day | ORAL | 3 refills | Status: DC
Start: 2022-10-15 — End: 2023-09-26

## 2022-10-29 ENCOUNTER — Ambulatory Visit (INDEPENDENT_AMBULATORY_CARE_PROVIDER_SITE_OTHER): Payer: Medicare Other

## 2022-10-29 DIAGNOSIS — I495 Sick sinus syndrome: Secondary | ICD-10-CM

## 2022-10-29 LAB — CUP PACEART REMOTE DEVICE CHECK
Battery Remaining Longevity: 119 mo
Battery Voltage: 3 V
Brady Statistic AP VP Percent: 0.05 %
Brady Statistic AP VS Percent: 98.74 %
Brady Statistic AS VP Percent: 0 %
Brady Statistic AS VS Percent: 1.21 %
Brady Statistic RA Percent Paced: 98.81 %
Brady Statistic RV Percent Paced: 0.05 %
Date Time Interrogation Session: 20240503132048
Implantable Lead Connection Status: 753985
Implantable Lead Connection Status: 753985
Implantable Lead Implant Date: 20081106
Implantable Lead Implant Date: 20210210
Implantable Lead Location: 753859
Implantable Lead Location: 753860
Implantable Lead Model: 4076
Implantable Lead Model: 4076
Implantable Pulse Generator Implant Date: 20210210
Lead Channel Impedance Value: 285 Ohm
Lead Channel Impedance Value: 342 Ohm
Lead Channel Impedance Value: 361 Ohm
Lead Channel Impedance Value: 513 Ohm
Lead Channel Pacing Threshold Amplitude: 0.625 V
Lead Channel Pacing Threshold Amplitude: 0.875 V
Lead Channel Pacing Threshold Pulse Width: 0.4 ms
Lead Channel Pacing Threshold Pulse Width: 0.4 ms
Lead Channel Sensing Intrinsic Amplitude: 1.125 mV
Lead Channel Sensing Intrinsic Amplitude: 1.125 mV
Lead Channel Sensing Intrinsic Amplitude: 10.875 mV
Lead Channel Sensing Intrinsic Amplitude: 10.875 mV
Lead Channel Setting Pacing Amplitude: 1.5 V
Lead Channel Setting Pacing Amplitude: 2 V
Lead Channel Setting Pacing Pulse Width: 0.4 ms
Lead Channel Setting Sensing Sensitivity: 0.9 mV
Zone Setting Status: 755011
Zone Setting Status: 755011

## 2022-11-03 ENCOUNTER — Other Ambulatory Visit: Payer: Self-pay | Admitting: Family Medicine

## 2022-11-14 ENCOUNTER — Other Ambulatory Visit: Payer: Self-pay | Admitting: Family Medicine

## 2022-11-17 NOTE — Progress Notes (Signed)
Remote pacemaker transmission.   

## 2022-11-28 ENCOUNTER — Other Ambulatory Visit (HOSPITAL_BASED_OUTPATIENT_CLINIC_OR_DEPARTMENT_OTHER): Payer: Self-pay | Admitting: Family

## 2022-11-28 ENCOUNTER — Other Ambulatory Visit: Payer: Self-pay | Admitting: Family Medicine

## 2022-11-28 DIAGNOSIS — I498 Other specified cardiac arrhythmias: Secondary | ICD-10-CM

## 2022-12-01 ENCOUNTER — Other Ambulatory Visit: Payer: Self-pay | Admitting: Family Medicine

## 2022-12-25 ENCOUNTER — Encounter: Payer: Self-pay | Admitting: Family Medicine

## 2022-12-27 MED ORDER — GABAPENTIN 100 MG PO CAPS: ORAL_CAPSULE | ORAL | 1 refills | Status: DC

## 2022-12-27 NOTE — Telephone Encounter (Signed)
Pt Requesting refill for Gabapentin 100 mg. Last OV 07/30/2022.

## 2022-12-28 ENCOUNTER — Other Ambulatory Visit: Payer: Self-pay | Admitting: Family Medicine

## 2022-12-28 ENCOUNTER — Other Ambulatory Visit: Payer: Self-pay

## 2022-12-28 DIAGNOSIS — I480 Paroxysmal atrial fibrillation: Secondary | ICD-10-CM

## 2022-12-28 MED ORDER — APIXABAN 5 MG PO TABS
5.0000 mg | ORAL_TABLET | Freq: Two times a day (BID) | ORAL | 1 refills | Status: DC
Start: 2022-12-28 — End: 2023-06-27

## 2022-12-28 NOTE — Telephone Encounter (Signed)
Prescription refill request for Eliquis received. Indication:CVA Last office visit:2/24 Scr:1.26  3/24 Age: 89 Weight:82.1  KG  PRESCRIPTION REFILLED

## 2023-01-15 ENCOUNTER — Other Ambulatory Visit: Payer: Self-pay | Admitting: Family Medicine

## 2023-01-28 ENCOUNTER — Ambulatory Visit: Payer: Medicare Other

## 2023-01-28 DIAGNOSIS — I495 Sick sinus syndrome: Secondary | ICD-10-CM | POA: Diagnosis not present

## 2023-01-28 LAB — CUP PACEART REMOTE DEVICE CHECK
Battery Remaining Longevity: 118 mo
Battery Voltage: 3 V
Brady Statistic AP VP Percent: 0.05 %
Brady Statistic AP VS Percent: 99.06 %
Brady Statistic AS VP Percent: 0 %
Brady Statistic AS VS Percent: 0.89 %
Brady Statistic RA Percent Paced: 99.15 %
Brady Statistic RV Percent Paced: 0.05 %
Date Time Interrogation Session: 20240802032909
Implantable Lead Connection Status: 753985
Implantable Lead Connection Status: 753985
Implantable Lead Implant Date: 20081106
Implantable Lead Implant Date: 20210210
Implantable Lead Location: 753859
Implantable Lead Location: 753860
Implantable Lead Model: 4076
Implantable Lead Model: 4076
Implantable Pulse Generator Implant Date: 20210210
Lead Channel Impedance Value: 285 Ohm
Lead Channel Impedance Value: 342 Ohm
Lead Channel Impedance Value: 399 Ohm
Lead Channel Impedance Value: 513 Ohm
Lead Channel Pacing Threshold Amplitude: 0.75 V
Lead Channel Pacing Threshold Amplitude: 1.125 V
Lead Channel Pacing Threshold Pulse Width: 0.4 ms
Lead Channel Pacing Threshold Pulse Width: 0.4 ms
Lead Channel Sensing Intrinsic Amplitude: 1.5 mV
Lead Channel Sensing Intrinsic Amplitude: 1.5 mV
Lead Channel Sensing Intrinsic Amplitude: 10.625 mV
Lead Channel Sensing Intrinsic Amplitude: 10.625 mV
Lead Channel Setting Pacing Amplitude: 1.5 V
Lead Channel Setting Pacing Amplitude: 2.25 V
Lead Channel Setting Pacing Pulse Width: 0.4 ms
Lead Channel Setting Sensing Sensitivity: 0.9 mV
Zone Setting Status: 755011
Zone Setting Status: 755011

## 2023-01-31 ENCOUNTER — Encounter: Payer: Self-pay | Admitting: Family Medicine

## 2023-01-31 ENCOUNTER — Ambulatory Visit (INDEPENDENT_AMBULATORY_CARE_PROVIDER_SITE_OTHER): Payer: Medicare Other | Admitting: Family Medicine

## 2023-01-31 VITALS — BP 146/81 | HR 72 | Temp 98.0°F | Ht 68.0 in | Wt 183.0 lb

## 2023-01-31 DIAGNOSIS — G4700 Insomnia, unspecified: Secondary | ICD-10-CM | POA: Diagnosis not present

## 2023-01-31 DIAGNOSIS — E1159 Type 2 diabetes mellitus with other circulatory complications: Secondary | ICD-10-CM

## 2023-01-31 DIAGNOSIS — Z794 Long term (current) use of insulin: Secondary | ICD-10-CM

## 2023-01-31 DIAGNOSIS — E1165 Type 2 diabetes mellitus with hyperglycemia: Secondary | ICD-10-CM

## 2023-01-31 DIAGNOSIS — I152 Hypertension secondary to endocrine disorders: Secondary | ICD-10-CM

## 2023-01-31 LAB — POCT GLYCOSYLATED HEMOGLOBIN (HGB A1C): Hemoglobin A1C: 7.7 % — AB (ref 4.0–5.6)

## 2023-01-31 MED ORDER — MIRTAZAPINE 15 MG PO TBDP: 15.0000 mg | ORAL_TABLET | Freq: Every day | ORAL | 3 refills | Status: DC

## 2023-01-31 NOTE — Progress Notes (Signed)
   Thomas [name] is a 89 y.o. male who presents today for an office visit.  Assessment/Plan:  Chronic Problems Addressed Today: Type 2 diabetes mellitus with hyperglycemia ([name]) A1c stable 7.7.  Continue Lantus 15 units daily.  No lows or symptomatic highs.  Recheck in 3 to 6 months  Hypertension associated with diabetes [name]) Following with cardiology for labile blood pressure.  Mildly elevated today.  Will continue current regimen with Coreg 3.125 mg twice daily and midodrine 2.5 mg 3 times daily.  He has not had any symptomatic lows.  Insomnia Stable on gabapentin 200 mg daily and mirtazapine 15 mg nightly.  Will refill today.     Subjective:  HPI:  See A/P for status of chronic conditions.  Patient is here today for follow-up.  Last seen here 6 months ago.At our last visit his A1c was 8.1.  We continued Lantus 15 units daily.  He has done well with this.  No acute concerns today.  Feels well.       Objective:  Physical Exam: BP (!) 146/81   Pulse 72   Temp 98 F (36.7 C) (Temporal)   Ht 5\' 8"  (1.727 m)   Wt 183 lb (83 kg)   SpO2 97%   BMI 27.83 kg/m   Gen: No acute distress, resting comfortably CV: Regular rate and rhythm with no murmurs appreciated Pulm: Normal work of breathing, clear to auscultation bilaterally with no crackles, wheezes, or rhonchi Neuro: Grossly normal, moves all extremities Psych: Normal affect and thought content       M. [name], MD 01/31/2023 11:38 AM

## 2023-01-31 NOTE — Assessment & Plan Note (Signed)
A1c stable 7.7.  Continue Lantus 15 units daily.  No lows or symptomatic highs.  Recheck in 3 to 6 months

## 2023-01-31 NOTE — Assessment & Plan Note (Signed)
Following with cardiology for labile blood pressure.  Mildly elevated today.  Will continue current regimen with Coreg 3.125 mg twice daily and midodrine 2.5 mg 3 times daily.  He has not had any symptomatic lows.

## 2023-01-31 NOTE — Patient Instructions (Signed)
It was very nice to see you today!  Your A1c looks great today.  No medication changes.  Will see you back in 6 months.  Come back sooner if needed.  Return in about 6 months (around 08/03/2023) for Annual Physical.   Take care, Dr [name]  PLEASE NOTE:  If you had any lab tests, please let us know if you have not heard back within a few days. You may see your results on mychart before we have a chance to review them but we will give you a call once they are reviewed by [place].   If we ordered any referrals today, please let us know if you have not heard from their office within the next week.   If you had any urgent prescriptions sent in today, please check with the pharmacy within an hour of our visit to make sure the prescription was transmitted appropriately.   Please try these tips to maintain a healthy lifestyle:  Eat at least 3 REAL meals and 1-2 snacks per day.  Aim for no more than 5 hours between eating.  If you eat breakfast, please do so within one hour of getting up.   Each meal should contain half fruits/vegetables, one quarter protein, and one quarter carbs (no bigger than a computer mouse)  Cut down on sweet beverages. This includes juice, soda, and sweet tea.   Drink at least 1 glass of water with each meal and aim for at least 8 glasses per day  Exercise at least 150 minutes every week.

## 2023-01-31 NOTE — Assessment & Plan Note (Signed)
Stable on gabapentin 200 mg daily and mirtazapine 15 mg nightly.  Will refill today.

## 2023-02-07 NOTE — Progress Notes (Signed)
Remote pacemaker transmission.   

## 2023-02-08 ENCOUNTER — Other Ambulatory Visit: Payer: Self-pay | Admitting: Family Medicine

## 2023-02-14 ENCOUNTER — Ambulatory Visit: Payer: Medicare Other | Admitting: Podiatry

## 2023-02-14 ENCOUNTER — Encounter: Payer: Self-pay | Admitting: Podiatry

## 2023-02-14 DIAGNOSIS — E1165 Type 2 diabetes mellitus with hyperglycemia: Secondary | ICD-10-CM | POA: Diagnosis not present

## 2023-02-14 DIAGNOSIS — B351 Tinea unguium: Secondary | ICD-10-CM

## 2023-02-14 DIAGNOSIS — L84 Corns and callosities: Secondary | ICD-10-CM | POA: Diagnosis not present

## 2023-02-14 DIAGNOSIS — Z794 Long term (current) use of insulin: Secondary | ICD-10-CM

## 2023-02-14 NOTE — Progress Notes (Addendum)
This patient returns to my office for at risk foot care.  This patient requires this care by a professional since this patient will be at risk due to having diabetes  and coagulation defect due to eliquis..  He has painful callus on inside of his left heel.This patient is unable to cut nails himself since the patient cannot reach his nails.These nails are painful walking and wearing shoes.  This patient presents for at risk foot care today. He presents to the office with his son Faustino Congress a year between visits.  General Appearance  Alert, conversant and in no acute stress.  Vascular  Dorsalis pedis and posterior tibial  pulses are  weakly palpable  bilaterally.  Capillary return is within normal limits  bilaterally. Temperature is within normal limits  bilaterally.  Neurologic  Senn-Weinstein monofilament wire test diminished.  bilaterally. Muscle power within normal limits bilaterally.  Nails Thick disfigured discolored nails with subungual debris  from hallux to fifth toes bilaterally. No evidence of bacterial infection or drainage bilaterally.  Orthopedic  No limitations of motion  feet .  No crepitus or effusions noted.  No bony pathology or digital deformities noted.  Skin  normotropic skin with no porokeratosis noted bilaterally.  No signs of infections or ulcers noted.   Heel callus left foot.  Onychomycosis  Pain in right toes  Pain in left toes Heel Callus  Consent was obtained for treatment procedures.   Mechanical debridement of nails 1-5  bilaterally performed with a nail nipper.  Filed with dremel without incident. Debride heel callus with dremel tool.   Return office visit    4 months                  Told patient to return for periodic foot care and evaluation due to potential at risk complications.   Helane Gunther DPM

## 2023-02-21 ENCOUNTER — Other Ambulatory Visit: Payer: Self-pay | Admitting: *Deleted

## 2023-02-21 DIAGNOSIS — I951 Orthostatic hypotension: Secondary | ICD-10-CM

## 2023-02-21 MED ORDER — MIDODRINE HCL 2.5 MG PO TABS
2.5000 mg | ORAL_TABLET | Freq: Three times a day (TID) | ORAL | 2 refills | Status: DC
Start: 2023-02-21 — End: 2023-09-26

## 2023-03-01 NOTE — Progress Notes (Signed)
  Cardiology Office Note:  .   Date:  03/02/2023  ID:  Thomas [name], DOB 1928-07-09, MRN [id number] PCP: Ardith Dark, MD  [facility] Providers Cardiologist:  previous Shari Prows,  now [facility]  Click to update primary MD,subspecialty MD or APP then REFRESH:1}   History of Present Illness: .   Thomas [name] is a 89 y.o. male with HLD, DMII, CVA, CAD, HTN  SSS, pulmonary embolism, on Eliquis  Orthostatic hypotension Seen with Barbara Cower, Son.   Hc of first MI at age 19 Subsequent PCIs, eventually had CABG   Has dilated aortic root   No cardiac issues   Has hx of CABG  11 coronary stents  No cp   He is able to ambulate in his house but he is quite slow.  He was examined in the wheelchair today.  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)   Denies any chest pain ,   Hx of a massive PE following knee surgery  Was on warfarin, now on eliquis    ROS:    Studies Reviewed: Marland Kitchen        EKG Interpretation Date/Time:  Wednesday March 02 2023 15:13:13 EDT Ventricular Rate:  71 PR Interval:  286 QRS Duration:  92 QT Interval:  450 QTC Calculation: 489 R Axis:   28  Text Interpretation: Atrial-paced rhythm with prolonged AV conduction Minimal voltage criteria for LVH, may be normal variant ( R in aVL ) ST & T wave abnormality, consider inferior ischemia When compared with ECG of 10-Oct-2021 17:53, No significant change since last tracing Confirmed by Kristeen Miss ([id number]) on 03/02/2023 3:45:37 PM   Risk Assessment/Calculations:             Physical Exam:   VS:  BP 124/72   Pulse 73   Ht 5' 8.5" (1.74 m)   Wt 181 lb 6.4 oz (82.3 kg)   SpO2 97%   BMI 27.18 kg/m    Wt Readings from Last 3 Encounters:  03/02/23 181 lb 6.4 oz (82.3 kg)  01/31/23 183 lb (83 kg)  10/04/22 181 lb (82.1 kg)    GEN:  elderly male,  examined in wheelchair in no acute distress NECK:  No JVD; No carotid bruits CARDIAC: RRR, no murmurs, rubs, gallops RESPIRATORY:  Clear to auscultation without rales, wheezing or rhonchi  ABDOMEN: Soft, non-tender, non-distended EXTREMITIES:  No edema; No deformity   ASSESSMENT AND PLAN: .   1.  Coronary artery disease: He is not having any episodes of chest pain or shortness of breath.  Continue current medications.  2.  Hyperlipidemia: He has not had his lipids checked in several years.  Will check lipids just to make sure that he is still on target.  3.  Paroxysmal atrial fibrillation: He has been on amiodarone for many years.  Will reduce his amiodarone dose to 100 mg a day.  Will check a TSH, ALT, basic metabolic profile.  4.  Sick sinus syndrome-status post permanent pacemaker.  He will continue to follow-up with Dr. Taylor/electrophysiology.  4.  Diabetes mellitus: Plans per primary medical doctor.       Dispo: 1 year    Signed, Kristeen Miss, MD

## 2023-03-02 ENCOUNTER — Encounter: Payer: Self-pay | Admitting: Cardiovascular Disease

## 2023-03-02 ENCOUNTER — Ambulatory Visit: Payer: Medicare Other | Attending: Cardiology | Admitting: Cardiovascular Disease

## 2023-03-02 VITALS — BP 124/72 | HR 73 | Ht 68.5 in | Wt 181.4 lb

## 2023-03-02 DIAGNOSIS — I480 Paroxysmal atrial fibrillation: Secondary | ICD-10-CM | POA: Diagnosis not present

## 2023-03-02 DIAGNOSIS — Z79899 Other long term (current) drug therapy: Secondary | ICD-10-CM | POA: Diagnosis not present

## 2023-03-02 MED ORDER — AMIODARONE HCL 200 MG PO TABS
ORAL_TABLET | ORAL | 3 refills | Status: DC
Start: 2023-03-02 — End: 2023-09-26

## 2023-03-02 NOTE — Patient Instructions (Signed)
Medication Instructions:  DECREASE Amiodarone to 100mg  daily *If you need a refill on your cardiac medications before your next appointment, please call your pharmacy*   Lab Work: TSH, Lipids, ALT, BMET today If you have labs (blood work) drawn today and your tests are completely normal, you will receive your results only by: MyChart Message (if you have MyChart) OR A paper copy in the mail If you have any lab test that is abnormal or we need to change your treatment, we will call you to review the results.   Testing/Procedures: NONE   Follow-Up: At [facility], you and your health needs are our priority.  As part of our continuing mission to provide you with exceptional heart care, we have created designated Provider Care Teams.  These Care Teams include your primary Cardiologist (physician) and Advanced Practice Providers (APPs -  Physician Assistants and Nurse Practitioners) who all work together to provide you with the care you need, when you need it.  We recommend signing up for the patient portal called "MyChart".  Sign up information is provided on this After Visit Summary.  MyChart is used to connect with patients for Virtual Visits (Telemedicine).  Patients are able to view lab/test results, encounter notes, upcoming appointments, etc.  Non-urgent messages can be sent to your provider as well.   To learn more about what you can do with MyChart, go to [email or web address].    Your next appointment:   1 year(s)  Provider:   Kristeen Miss, MD

## 2023-03-03 LAB — LIPID PANEL
Chol/HDL Ratio: 3.2 ratio (ref 0.0–5.0)
Cholesterol, Total: 136 mg/dL (ref 100–199)
HDL: 43 mg/dL (ref >39)
LDL Chol Calc (NIH): 61 mg/dL (ref 0–99)
Triglycerides: 192 mg/dL — ABNORMAL HIGH (ref 0–149)
VLDL Cholesterol Cal: 32 mg/dL (ref 5–40)

## 2023-03-03 LAB — BASIC METABOLIC PANEL
BUN/Creatinine Ratio: 19 (ref 10–24)
BUN: 23 mg/dL (ref 10–36)
CO2: 23 mmol/L (ref 20–29)
Calcium: 8.8 mg/dL (ref 8.6–10.2)
Chloride: 102 mmol/L (ref 96–106)
Creatinine, Ser: 1.19 mg/dL (ref 0.76–1.27)
Glucose: 158 mg/dL — ABNORMAL HIGH (ref 70–99)
Potassium: 4.3 mmol/L (ref 3.5–5.2)
Sodium: 140 mmol/L (ref 134–144)
eGFR: 57 mL/min/{1.73_m2} — ABNORMAL LOW (ref >59)

## 2023-03-03 LAB — TSH: TSH: 8.76 u[IU]/mL — ABNORMAL HIGH (ref 0.450–4.500)

## 2023-03-03 LAB — ALT: ALT: 12 IU/L (ref 0–44)

## 2023-03-16 ENCOUNTER — Ambulatory Visit: Payer: Medicare Other | Admitting: Cardiology

## 2023-04-19 ENCOUNTER — Other Ambulatory Visit: Payer: Self-pay | Admitting: Family Medicine

## 2023-04-29 ENCOUNTER — Ambulatory Visit (INDEPENDENT_AMBULATORY_CARE_PROVIDER_SITE_OTHER): Payer: Medicare Other

## 2023-04-29 DIAGNOSIS — I495 Sick sinus syndrome: Secondary | ICD-10-CM

## 2023-04-29 LAB — CUP PACEART REMOTE DEVICE CHECK
Battery Remaining Longevity: 114 mo
Battery Voltage: 3 V
Brady Statistic AP VP Percent: 0.47 %
Brady Statistic AP VS Percent: 96.97 %
Brady Statistic AS VP Percent: 0.01 %
Brady Statistic AS VS Percent: 2.55 %
Brady Statistic RA Percent Paced: 97.48 %
Brady Statistic RV Percent Paced: 0.49 %
Date Time Interrogation Session: 20241101055600
Implantable Lead Connection Status: 753985
Implantable Lead Connection Status: 753985
Implantable Lead Implant Date: 20081106
Implantable Lead Implant Date: 20210210
Implantable Lead Location: 753859
Implantable Lead Location: 753860
Implantable Lead Model: 4076
Implantable Lead Model: 4076
Implantable Pulse Generator Implant Date: 20210210
Lead Channel Impedance Value: 285 Ohm
Lead Channel Impedance Value: 342 Ohm
Lead Channel Impedance Value: 380 Ohm
Lead Channel Impedance Value: 513 Ohm
Lead Channel Pacing Threshold Amplitude: 0.75 V
Lead Channel Pacing Threshold Amplitude: 1 V
Lead Channel Pacing Threshold Pulse Width: 0.4 ms
Lead Channel Pacing Threshold Pulse Width: 0.4 ms
Lead Channel Sensing Intrinsic Amplitude: 1.5 mV
Lead Channel Sensing Intrinsic Amplitude: 1.5 mV
Lead Channel Sensing Intrinsic Amplitude: 8.875 mV
Lead Channel Sensing Intrinsic Amplitude: 8.875 mV
Lead Channel Setting Pacing Amplitude: 1.5 V
Lead Channel Setting Pacing Amplitude: 2 V
Lead Channel Setting Pacing Pulse Width: 0.4 ms
Lead Channel Setting Sensing Sensitivity: 0.9 mV
Zone Setting Status: 755011
Zone Setting Status: 755011

## 2023-05-11 NOTE — Progress Notes (Signed)
Remote pacemaker transmission.   

## 2023-05-22 ENCOUNTER — Other Ambulatory Visit: Payer: Self-pay | Admitting: Family Medicine

## 2023-06-16 ENCOUNTER — Ambulatory Visit: Payer: Medicare Other | Admitting: Podiatry

## 2023-06-27 ENCOUNTER — Other Ambulatory Visit: Payer: Self-pay

## 2023-06-27 DIAGNOSIS — I480 Paroxysmal atrial fibrillation: Secondary | ICD-10-CM

## 2023-06-27 MED ORDER — APIXABAN 5 MG PO TABS: 5.0000 mg | ORAL_TABLET | Freq: Two times a day (BID) | ORAL | 1 refills | Status: DC

## 2023-06-27 NOTE — Telephone Encounter (Signed)
Prescription refill request for Eliquis received. Indication:afib Last office visit:9/24 Scr:1.19  9/24 Age: 89 Weight:82.3  kg  Prescription refilled

## 2023-07-14 ENCOUNTER — Other Ambulatory Visit: Payer: Self-pay | Admitting: Family Medicine

## 2023-07-29 ENCOUNTER — Ambulatory Visit (INDEPENDENT_AMBULATORY_CARE_PROVIDER_SITE_OTHER): Payer: Medicare Other

## 2023-07-29 DIAGNOSIS — I495 Sick sinus syndrome: Secondary | ICD-10-CM | POA: Diagnosis not present

## 2023-08-01 LAB — CUP PACEART REMOTE DEVICE CHECK
Battery Remaining Longevity: 112 mo
Battery Voltage: 3 V
Brady Statistic AP VP Percent: 0.33 %
Brady Statistic AP VS Percent: 96.35 %
Brady Statistic AS VP Percent: 0.01 %
Brady Statistic AS VS Percent: 3.32 %
Brady Statistic RA Percent Paced: 96.88 %
Brady Statistic RV Percent Paced: 0.33 %
Date Time Interrogation Session: 20250130225229
Implantable Lead Connection Status: 753985
Implantable Lead Connection Status: 753985
Implantable Lead Implant Date: 20081106
Implantable Lead Implant Date: 20210210
Implantable Lead Location: 753859
Implantable Lead Location: 753860
Implantable Lead Model: 4076
Implantable Lead Model: 4076
Implantable Pulse Generator Implant Date: 20210210
Lead Channel Impedance Value: 304 Ohm
Lead Channel Impedance Value: 361 Ohm
Lead Channel Impedance Value: 399 Ohm
Lead Channel Impedance Value: 532 Ohm
Lead Channel Pacing Threshold Amplitude: 0.75 V
Lead Channel Pacing Threshold Amplitude: 0.875 V
Lead Channel Pacing Threshold Pulse Width: 0.4 ms
Lead Channel Pacing Threshold Pulse Width: 0.4 ms
Lead Channel Sensing Intrinsic Amplitude: 1.125 mV
Lead Channel Sensing Intrinsic Amplitude: 1.125 mV
Lead Channel Sensing Intrinsic Amplitude: 14.25 mV
Lead Channel Sensing Intrinsic Amplitude: 14.25 mV
Lead Channel Setting Pacing Amplitude: 1.5 V
Lead Channel Setting Pacing Amplitude: 2 V
Lead Channel Setting Pacing Pulse Width: 0.4 ms
Lead Channel Setting Sensing Sensitivity: 0.9 mV
Zone Setting Status: 755011
Zone Setting Status: 755011

## 2023-08-04 ENCOUNTER — Encounter: Payer: Self-pay | Admitting: Family Medicine

## 2023-08-04 ENCOUNTER — Ambulatory Visit (INDEPENDENT_AMBULATORY_CARE_PROVIDER_SITE_OTHER): Payer: Medicare Other | Admitting: Family Medicine

## 2023-08-04 VITALS — BP 113/69 | HR 71 | Temp 97.2°F | Ht 68.5 in | Wt 179.4 lb

## 2023-08-04 DIAGNOSIS — Z0001 Encounter for general adult medical examination with abnormal findings: Secondary | ICD-10-CM

## 2023-08-04 DIAGNOSIS — E1159 Type 2 diabetes mellitus with other circulatory complications: Secondary | ICD-10-CM | POA: Diagnosis not present

## 2023-08-04 DIAGNOSIS — E1169 Type 2 diabetes mellitus with other specified complication: Secondary | ICD-10-CM

## 2023-08-04 DIAGNOSIS — G4700 Insomnia, unspecified: Secondary | ICD-10-CM

## 2023-08-04 DIAGNOSIS — E785 Hyperlipidemia, unspecified: Secondary | ICD-10-CM | POA: Diagnosis not present

## 2023-08-04 DIAGNOSIS — Z794 Long term (current) use of insulin: Secondary | ICD-10-CM | POA: Diagnosis not present

## 2023-08-04 DIAGNOSIS — N400 Enlarged prostate without lower urinary tract symptoms: Secondary | ICD-10-CM | POA: Diagnosis not present

## 2023-08-04 DIAGNOSIS — I951 Orthostatic hypotension: Secondary | ICD-10-CM

## 2023-08-04 DIAGNOSIS — E1165 Type 2 diabetes mellitus with hyperglycemia: Secondary | ICD-10-CM

## 2023-08-04 DIAGNOSIS — E039 Hypothyroidism, unspecified: Secondary | ICD-10-CM

## 2023-08-04 DIAGNOSIS — I152 Hypertension secondary to endocrine disorders: Secondary | ICD-10-CM

## 2023-08-04 LAB — COMPREHENSIVE METABOLIC PANEL
ALT: 15 U/L (ref 0–53)
AST: 19 U/L (ref 0–37)
Albumin: 4 g/dL (ref 3.5–5.2)
Alkaline Phosphatase: 51 U/L (ref 39–117)
BUN: 28 mg/dL — ABNORMAL HIGH (ref 6–23)
CO2: 25 meq/L (ref 19–32)
Calcium: 9.6 mg/dL (ref 8.4–10.5)
Chloride: 101 meq/L (ref 96–112)
Creatinine, Ser: 1.47 mg/dL (ref 0.40–1.50)
GFR: 41.17 mL/min — ABNORMAL LOW (ref >60.00)
Glucose, Bld: 191 mg/dL — ABNORMAL HIGH (ref 70–99)
Potassium: 4.5 meq/L (ref 3.5–5.1)
Sodium: 135 meq/L (ref 135–145)
Total Bilirubin: 0.3 mg/dL (ref 0.2–1.2)
Total Protein: 7.3 g/dL (ref 6.0–8.3)

## 2023-08-04 LAB — LIPID PANEL
Cholesterol: 115 mg/dL (ref 0–200)
HDL: 39.4 mg/dL (ref >39.00)
LDL Cholesterol: 38 mg/dL (ref 0–99)
NonHDL: 76.09
Total CHOL/HDL Ratio: 3
Triglycerides: 188 mg/dL — ABNORMAL HIGH (ref 0.0–149.0)
VLDL: 37.6 mg/dL (ref 0.0–40.0)

## 2023-08-04 LAB — URINALYSIS, ROUTINE W REFLEX MICROSCOPIC
Bilirubin Urine: NEGATIVE
Hgb urine dipstick: NEGATIVE
Leukocytes,Ua: NEGATIVE
Nitrite: NEGATIVE
RBC / HPF: NONE SEEN (ref 0–?)
Specific Gravity, Urine: 1.02 (ref 1.000–1.030)
Total Protein, Urine: NEGATIVE
Urine Glucose: NEGATIVE
Urobilinogen, UA: 0.2 (ref 0.0–1.0)
pH: 5.5 (ref 5.0–8.0)

## 2023-08-04 LAB — MICROALBUMIN / CREATININE URINE RATIO
Creatinine,U: 128 mg/dL
Microalb Creat Ratio: 5 mg/g (ref 0.0–30.0)
Microalb, Ur: 6.4 mg/dL — ABNORMAL HIGH (ref 0.0–1.9)

## 2023-08-04 LAB — HEMOGLOBIN A1C: Hgb A1c MFr Bld: 8.7 % — ABNORMAL HIGH (ref 4.6–6.5)

## 2023-08-04 LAB — CBC
HCT: 37.3 % — ABNORMAL LOW (ref 39.0–52.0)
Hemoglobin: 12.4 g/dL — ABNORMAL LOW (ref 13.0–17.0)
MCHC: 33.3 g/dL (ref 30.0–36.0)
MCV: 100.6 fL — ABNORMAL HIGH (ref 78.0–100.0)
Platelets: 208 10*3/uL (ref 150.0–400.0)
RBC: 3.7 Mil/uL — ABNORMAL LOW (ref 4.22–5.81)
RDW: 13.4 % (ref 11.5–15.5)
WBC: 9.3 10*3/uL (ref 4.0–10.5)

## 2023-08-04 LAB — TSH: TSH: 10.4 u[IU]/mL — ABNORMAL HIGH (ref 0.35–5.50)

## 2023-08-04 MED ORDER — TAMSULOSIN HCL 0.4 MG PO CAPS: 0.8000 mg | ORAL_CAPSULE | Freq: Every day | ORAL | 3 refills | Status: DC

## 2023-08-04 NOTE — Assessment & Plan Note (Signed)
Stable on Crestor 20 mg daily.

## 2023-08-04 NOTE — Assessment & Plan Note (Signed)
 Stable on gabapentin  200 mg nightly and mirtazapine  15 mg nightly.

## 2023-08-04 NOTE — Assessment & Plan Note (Signed)
 Following with cardiology for labile blood pressure.  At goal today on regimen per cardiology with Coreg  3.125 mg twice daily and midodrine  2.5 mg 3 times daily.

## 2023-08-04 NOTE — Patient Instructions (Signed)
 It was very nice to see you today!  We will check blood work and a urine sample today.  Please increase your Flomax  to 0.8 mg daily.  Return in about 3 months (around 11/01/2023) for Follow Up.   Take care, Dr Kennyth  PLEASE NOTE:  If you had any lab tests, please let us  know if you have not heard back within a few days. You may see your results on mychart before we have a chance to review them but we will give you a call once they are reviewed by us .   If we ordered any referrals today, please let us  know if you have not heard from their office within the next week.   If you had any urgent prescriptions sent in today, please check with the pharmacy within an hour of our visit to make sure the prescription was transmitted appropriately.   Please try these tips to maintain a healthy lifestyle:  Eat at least 3 REAL meals and 1-2 snacks per day.  Aim for no more than 5 hours between eating.  If you eat breakfast, please do so within one hour of getting up.   Each meal should contain half fruits/vegetables, one quarter protein, and one quarter carbs (no bigger than a computer mouse)  Cut down on sweet beverages. This includes juice, soda, and sweet tea.   Drink at least 1 glass of water with each meal and aim for at least 8 glasses per day  Exercise at least 150 minutes every week.     Preventive Care 96 Years and Older, Male Preventive care refers to lifestyle choices and visits with your health care provider that can promote health and wellness. Preventive care visits are also called wellness exams. What can I expect for my preventive care visit? Counseling During your preventive care visit, your health care provider may ask about your: Medical history, including: Past medical problems. Family medical history. History of falls. Current health, including: Emotional well-being. Home life and relationship well-being. Sexual activity. Memory and ability to understand  (cognition). Lifestyle, including: Alcohol, nicotine or tobacco, and drug use. Access to firearms. Diet, exercise, and sleep habits. Work and work astronomer. Sunscreen use. Safety issues such as seatbelt and bike helmet use. Physical exam Your health care provider will check your: Height and weight. These may be used to calculate your BMI (body mass index). BMI is a measurement that tells if you are at a healthy weight. Waist circumference. This measures the distance around your waistline. This measurement also tells if you are at a healthy weight and may help predict your risk of certain diseases, such as type 2 diabetes and high blood pressure. Heart rate and blood pressure. Body temperature. Skin for abnormal spots. What immunizations do I need?  Vaccines are usually given at various ages, according to a schedule. Your health care provider will recommend vaccines for you based on your age, medical history, and lifestyle or other factors, such as travel or where you work. What tests do I need? Screening Your health care provider may recommend screening tests for certain conditions. This may include: Lipid and cholesterol levels. Diabetes screening. This is done by checking your blood sugar (glucose) after you have not eaten for a while (fasting). Hepatitis C test. Hepatitis B test. HIV (human immunodeficiency virus) test. STI (sexually transmitted infection) testing, if you are at risk. Lung cancer screening. Colorectal cancer screening. Prostate cancer screening. Abdominal aortic aneurysm (AAA) screening. You may need this if you are  a current or former smoker. Talk with your health care provider about your test results, treatment options, and if necessary, the need for more tests. Follow these instructions at home: Eating and drinking  Eat a diet that includes fresh fruits and vegetables, whole grains, lean protein, and low-fat dairy products. Limit your intake of foods with  high amounts of sugar, saturated fats, and salt. Take vitamin and mineral supplements as recommended by your health care provider. Do not drink alcohol if your health care provider tells you not to drink. If you drink alcohol: Limit how much you have to 0-2 drinks a day. Know how much alcohol is in your drink. In the [place] one drink equals one 12 oz bottle of beer (355 mL), one 5 oz glass of wine (148 mL), or one 1 oz glass of hard liquor (44 mL). Lifestyle Brush your teeth every morning and night with fluoride toothpaste. Floss one time each day. Exercise for at least 30 minutes 5 or more days each week. Do not use any products that contain nicotine or tobacco. These products include cigarettes, chewing tobacco, and vaping devices, such as e-cigarettes. If you need help quitting, ask your health care provider. Do not use drugs. If you are sexually active, practice safe sex. Use a condom or other form of protection to prevent STIs. Take aspirin  only as told by your health care provider. Make sure that you understand how much to take and what form to take. Work with your health care provider to find out whether it is safe and beneficial for you to take aspirin  daily. Ask your health care provider if you need to take a cholesterol-lowering medicine (statin). Find healthy ways to manage stress, such as: Meditation, yoga, or listening to music. Journaling. Talking to a trusted person. Spending time with friends and family. Safety Always wear your seat belt while driving or riding in a vehicle. Do not drive: If you have been drinking alcohol. Do not ride with someone who has been drinking. When you are tired or distracted. While texting. If you have been using any mind-altering substances or drugs. Wear a helmet and other protective equipment during sports activities. If you have firearms in your house, make sure you follow all gun safety procedures. Minimize exposure to UV radiation to  reduce your risk of skin cancer. What's next? Visit your health care provider once a year for an annual wellness visit. Ask your health care provider how often you should have your eyes and teeth checked. Stay up to date on all vaccines. This information is not intended to replace advice given to you by your health care provider. Make sure you discuss any questions you have with your health care provider. Document Revised: 12/10/2020 Document Reviewed: 12/10/2020 Elsevier Patient Education  2024 [employer].

## 2023-08-04 NOTE — Assessment & Plan Note (Signed)
 On Flomax  0.4 mg daily.  He is having a little more hesitancy.  Will check UA and urine culture.  Will increase Flomax  to 0.8 mg daily.  We discussed potential side effects with increasing the dose.  He will follow-up with us  in a few weeks.  May need referral to urology depending on response to above.

## 2023-08-04 NOTE — Progress Notes (Signed)
 Chief Complaint:  Hosteen Kienast is a 89 y.o. male who presents today for his annual comprehensive physical exam.    Assessment/Plan:  Chronic Problems Addressed Today: BPH (benign prostatic hyperplasia) On Flomax  0.4 mg daily.  He is having a little more hesitancy.  Will check UA and urine culture.  Will increase Flomax  to 0.8 mg daily.  We discussed potential side effects with increasing the dose.  He will follow-up with us  in a few weeks.  May need referral to urology depending on response to above.  Type 2 diabetes mellitus with hyperglycemia ([name]) Check A1c.  He is on Lantus  15 units daily.  Hypothyroidism Check TSH. He is on Synthroid  100 mcg daily.  Hypertension associated with diabetes [facility]) Following with cardiology for labile blood pressure.  At goal today on regimen per cardiology with Coreg  3.125 mg twice daily and midodrine  2.5 mg 3 times daily.  Dyslipidemia due to type 2 diabetes mellitus ([name]) Stable on Crestor  20 mg daily.  Insomnia Stable on gabapentin  200 mg nightly and mirtazapine  15 mg nightly.  Preventative Healthcare: Check labs. Prevnar 20 given today.   Patient Counseling(The following topics were reviewed and/or handout was given):  -Nutrition: Stressed importance of moderation in sodium/caffeine intake, saturated fat and cholesterol, caloric balance, sufficient intake of fresh fruits, vegetables, and fiber.  -Stressed the importance of regular exercise.   -Substance Abuse: Discussed cessation/primary prevention of tobacco, alcohol, or other drug use; driving or other dangerous activities under the influence; availability of treatment for abuse.   -Injury prevention: Discussed safety belts, safety helmets, smoke detector, smoking near bedding or upholstery.   -Sexuality: Discussed sexually transmitted diseases, partner selection, use of condoms, avoidance of unintended pregnancy and contraceptive alternatives.   -Dental health: Discussed importance of  regular tooth brushing, flossing, and dental visits.  -Health maintenance and immunizations reviewed. Please refer to Health maintenance section.  Return to care in 1 year for next preventative visit.     Subjective:  HPI:  He has no acute complaints today.  He is here with his son today.  He has noticed a little bit more urinary hesitancy but this is manageable.  Lifestyle Diet: Balanced. Trying to get more fruits and vegetables.  Exercise: Limited but trying to work on some resistance training.      08/04/2023    9:26 AM  Depression screen PHQ 2/9  Decreased Interest 0  Down, Depressed, Hopeless 0  PHQ - 2 Score 0    Health Maintenance Due  Topic Date Due   FOOT EXAM  Never done   OPHTHALMOLOGY EXAM  Never done   Pneumonia Vaccine 31+ Years old (2 of 2 - PPSV23 or PCV20) 07/22/2015   HEMOGLOBIN A1C  08/03/2023     ROS: Per HPI, otherwise a complete review of systems was negative.   PMH:  The following were reviewed and entered/updated in epic: Past Medical History:  Diagnosis Date   Arthritis    Clotting disorder ([name])    Coronary artery disease    Diabetes ([name])    Hyperlipidemia    Hypertension    Hypothyroidism 07/17/2020   Kidney disease    Loss of hearing    Macular degeneration    Myocardial infarction [facility])    Osteoarthritis    Stroke [facility])    Patient Active Problem List   Diagnosis Date Noted   Heel callus 02/14/2023   Onychomycosis 07/30/2022   Constipation 07/30/2022   Edema of foot 04/22/2022   Diabetic foot ulcer (  [name]) 01/04/2022   Near syncope 03/16/2021   Clotting disorder ([name]) 03/16/2021   Orthostatic hypotension 10/01/2020   TIA (transient ischemic attack) 08/31/2020   Normocytic anemia 08/31/2020   Prolonged QT interval 08/31/2020   Acute CVA (cerebrovascular accident) ([name]) 08/31/2020   Debility 08/21/2020   Lytic bone lesion of femur 08/21/2020   Sick sinus syndrome ([name]) 07/31/2020   Coronary artery disease s/p CABG 2001  07/17/2020   Hypothyroidism 07/17/2020   Venous thromboembolism (VTE) 07/17/2020   Hypertension associated with diabetes ([name]) 07/17/2020   Cardiac pacemaker 07/17/2020   Osteoarthritis 07/17/2020   Type 2 diabetes mellitus with hyperglycemia ([name]) 07/17/2020   Vitamin D  deficiency 07/17/2020   Macular degeneration 07/17/2020   Dyslipidemia due to type 2 diabetes mellitus ([name]) 07/17/2020   Insomnia 07/17/2020   BPH (benign prostatic hyperplasia) 07/17/2020   Colon polyps 07/17/2020   Past Surgical History:  Procedure Laterality Date   CARPAL TUNNEL RELEASE  2011   CORONARY ANGIOPLASTY WITH STENT PLACEMENT     CORONARY ARTERY BYPASS GRAFT  2001   x6   REPLACEMENT TOTAL KNEE     SPINE SURGERY  2008   cervical and lumbar   THYROIDECTOMY      Family History  Problem Relation Age of Onset   Stroke Mother    Stroke Father    Heart disease Sister    Cancer Daughter    Hypertension Son    Heart disease Brother    Cancer Brother    Heart disease Brother     Medications- reviewed and updated Current Outpatient Medications  Medication Sig Dispense Refill   acetaminophen  (TYLENOL ) 325 MG tablet Take 650 mg by mouth every 6 (six) hours as needed for moderate pain or headache.     amiodarone  (PACERONE ) 200 MG tablet Take 1/2 tablet (100mg ) by mouth once daily 45 tablet 3   apixaban  (ELIQUIS ) 5 MG TABS tablet Take 1 tablet (5 mg total) by mouth 2 (two) times daily. 180 tablet 1   B-D UF III MINI PEN NEEDLES 31G X 5 MM MISC USE 1 DAILY WITH LANTUS  PEN 100 each 3   carvedilol  (COREG ) 3.125 MG tablet Take 1 tablet (3.125 mg total) by mouth 2 (two) times daily as needed (take only if Systolic Blood Pressure greater than 140). 180 tablet 2   cholecalciferol  (VITAMIN D3) 25 MCG (1000 UNIT) tablet Take 1,000 Units by mouth daily.     co-enzyme Q-10 50 MG capsule Take 50 mg by mouth daily.     furosemide  (LASIX ) 20 MG tablet TAKE 1 TABLET(20 MG) BY MOUTH DAILY 30 tablet 3   gabapentin   (NEURONTIN ) 100 MG capsule TAKE 2 CAPSULES(200 MG) BY MOUTH AT BEDTIME 180 capsule 1   glucosamine-chondroitin 500-400 MG tablet Take 1 tablet by mouth 3 (three) times daily.     LANTUS  SOLOSTAR 100 UNIT/ML Solostar Pen ADMINISTER 13 UNITS UNDER THE SKIN DAILY 15 mL 3   levothyroxine  (SYNTHROID ) 100 MCG tablet TAKE 1 TABLET(100 MCG) BY MOUTH DAILY 90 tablet 0   midodrine  (PROAMATINE ) 2.5 MG tablet Take 1 tablet (2.5 mg total) by mouth 3 (three) times daily with meals. 270 tablet 2   mirtazapine  (REMERON  SOL-TAB) 15 MG disintegrating tablet Take 1 tablet (15 mg total) by mouth at bedtime. 90 tablet 3   Multiple Vitamin (MULTIVITAMIN) tablet Take 1 tablet by mouth daily.     Red Yeast Rice Extract (RED YEAST RICE PO) Take 1 capsule by mouth in the morning and at bedtime.  rosuvastatin  (CRESTOR ) 20 MG tablet Take 1 tablet (20 mg total) by mouth at bedtime. 90 tablet 3   tamsulosin  (FLOMAX ) 0.4 MG CAPS capsule Take 2 capsules (0.8 mg total) by mouth daily after supper. 180 capsule 3   No current facility-administered medications for this visit.    Allergies-reviewed and updated Allergies  Allergen Reactions   Amitriptyline Shortness Of Breath and Palpitations   Lyrica [Pregabalin] Nausea And Vomiting   Pravachol [Pravastatin] Other (See Comments)    Muscle cramps and aches    Effexor Xr [Venlafaxine] Anxiety   Morphine Palpitations   Zolpidem Anxiety and Palpitations    Social History   Socioeconomic History   Marital status: Married    Spouse name: Not on file   Number of children: Not on file   Years of education: Not on file   Highest education level: Not on file  Occupational History   Not on file  Tobacco Use   Smoking status: Former    Types: Pipe   Smokeless tobacco: Never  Vaping Use   Vaping status: Never Used  Substance and Sexual Activity   Alcohol use: Never   Drug use: Never   Sexual activity: Not on file  Other Topics Concern   Not on file  Social History  Narrative   Not on file   Social Drivers of Health   Financial Resource Strain: Low Risk  (10/04/2022)   Overall Financial Resource Strain (CARDIA)    Difficulty of Paying Living Expenses: Not hard at all  Food Insecurity: No Food Insecurity (10/04/2022)   Hunger Vital Sign    Worried About Running Out of Food in the Last Year: Never true    Ran Out of Food in the Last Year: Never true  Transportation Needs: No Transportation Needs (10/04/2022)   PRAPARE - Administrator, Civil Service (Medical): No    Lack of Transportation (Non-Medical): No  Physical Activity: Insufficiently Active (10/04/2022)   Exercise Vital Sign    Days of Exercise per Week: 7 days    Minutes of Exercise per Session: 10 min  Stress: No Stress Concern Present (10/04/2022)   [employer] of Occupational Health - Occupational Stress Questionnaire    Feeling of Stress : Not at all  Social Connections: Moderately Integrated (10/04/2022)   Social Connection and Isolation Panel [NHANES]    Frequency of Communication with Friends and Family: More than three times a week    Frequency of Social Gatherings with Friends and Family: Once a week    Attends Religious Services: 1 to 4 times per year    Active Member of [employer] or Organizations: No    Attends Engineer, Structural: Never    Marital Status: Married        Objective:  Physical Exam: BP 113/69   Pulse 71   Temp (!) 97.2 F (36.2 C) (Temporal)   Ht 5' 8.5 (1.74 m)   Wt 179 lb 6.4 oz (81.4 kg)   SpO2 97%   BMI 26.88 kg/m   Body mass index is 26.88 kg/m. Wt Readings from Last 3 Encounters:  08/04/23 179 lb 6.4 oz (81.4 kg)  03/02/23 181 lb 6.4 oz (82.3 kg)  01/31/23 183 lb (83 kg)   Gen: NAD, resting comfortably HEENT: TMs normal bilaterally. OP clear. No thyromegaly noted.  CV: RRR with no murmurs appreciated Pulm: NWOB, CTAB with no crackles, wheezes, or rhonchi GI: Normal bowel sounds present. Soft, Nontender, Nondistended. MSK:  no edema, cyanosis,  or clubbing noted Skin: warm, dry Neuro: CN2-12 grossly intact.  Psych: Normal affect and thought content     Antigone Crowell M. Kennyth, MD 08/04/2023 10:02 AM

## 2023-08-04 NOTE — Assessment & Plan Note (Signed)
 Check A1c.  He is on Lantus  15 units daily.

## 2023-08-04 NOTE — Assessment & Plan Note (Addendum)
 Check TSH. He is on Synthroid  100 mcg daily.

## 2023-08-05 ENCOUNTER — Ambulatory Visit: Payer: Self-pay | Admitting: Family Medicine

## 2023-08-05 NOTE — Telephone Encounter (Signed)
 Second call back needed to triage reported weakness.  Patient's spouse inquired if the patient's urinalysis showed an infection.  Urine culture has not been review by pcp.  Office visit yesterday 08/04/2023

## 2023-08-05 NOTE — Telephone Encounter (Signed)
 Patient's spouse inquired if the patient's urinalysis showed an infection.  Urine culture has not been review by pcp.  Office visit yesterday 08/04/2023  Patient's wife called in regards to urinalysis results, wanting to know if he has a UTI and would need an antibiotic. I advised that Dr. Kennyth has not reviewed the results yet, and that if there was anything that would require antibiotics that they would be sure to contact her about it. She understood and has no further questions at this time.   Reason for Disposition  Caller requesting routine or non-urgent lab result  Answer Assessment - Initial Assessment Questions 1. REASON FOR CALL or QUESTION: What is your reason for calling today? or How can I best help you? or What question do you have that I can help answer?     Patient's wife calling about urinalysis results  2. CALLER: Document the source of call. (e.g., laboratory, patient).     Reena Necessary  Protocols used: PCP Call - No Triage-A-AH

## 2023-08-07 LAB — URINE CULTURE
MICRO NUMBER:: 16050907
SPECIMEN QUALITY:: ADEQUATE

## 2023-08-08 ENCOUNTER — Other Ambulatory Visit: Payer: Self-pay | Admitting: Family Medicine

## 2023-08-08 ENCOUNTER — Telehealth: Payer: Self-pay | Admitting: *Deleted

## 2023-08-08 ENCOUNTER — Encounter: Payer: Self-pay | Admitting: Family Medicine

## 2023-08-08 ENCOUNTER — Other Ambulatory Visit: Payer: Self-pay | Admitting: *Deleted

## 2023-08-08 MED ORDER — LEVOTHYROXINE SODIUM 112 MCG PO TABS: 112.0000 ug | ORAL_TABLET | Freq: Every day | ORAL | 1 refills | Status: DC

## 2023-08-08 MED ORDER — CEFDINIR 300 MG PO CAPS
300.0000 mg | ORAL_CAPSULE | Freq: Two times a day (BID) | ORAL | 0 refills | Status: AC
Start: 2023-08-08 — End: 2023-08-15

## 2023-08-08 NOTE — Progress Notes (Signed)
 We will send in prescription for Omnicef  for his urinary tract infection instead of bactrim due to concern with interaction with his other medications.

## 2023-08-08 NOTE — Progress Notes (Signed)
 His urine culture shows UTI.  This is probably causing his urinary symptoms.  Recommend we start Bactrim double strength tablet twice daily for 7 days.  He should let us  know if his symptoms are not improving.  His blood counts are stable to his baseline.  His sugar is high however not to the point where we need to make any adjustments to his treatment plan.  He should continue to work on cutting down sugar and we can recheck this again in 3 months.  His cholesterol levels are at goal.  His thyroid  level is off.  Recommend we increase his Synthroid  to 112 mcg daily.  We should recheck again in 4 to 6 weeks or we can check again at his next office visit.

## 2023-08-08 NOTE — Telephone Encounter (Signed)
 Please see pt spouse concern and advise on urine culture

## 2023-08-08 NOTE — Addendum Note (Signed)
 Addended by: Rodney Clamp on: 08/08/2023 12:08 PM   Modules accepted: Orders

## 2023-08-08 NOTE — Telephone Encounter (Signed)
 Copied from CRM [id number]. Topic: Clinical - Medical Advice >> Aug 08, 2023  9:49 AM Crist Dominion wrote: Reason for CRM: Patients wife is inquiring about the medication that Dr. [name] states he was going to fill for patients urinary tract infection as patients pharmacy has not received this and is requesting an update.   See Results note  Devell Parkerson,RMA

## 2023-08-08 NOTE — Telephone Encounter (Signed)
 See result note.  Thomas [name]. [name], MD 08/08/2023 8:56 AM

## 2023-08-18 ENCOUNTER — Other Ambulatory Visit: Payer: Self-pay | Admitting: Family Medicine

## 2023-09-02 NOTE — Addendum Note (Signed)
 Addended by: Elease Etienne A on: 09/02/2023 12:16 PM   Modules accepted: Orders

## 2023-09-02 NOTE — Progress Notes (Signed)
 Remote pacemaker transmission.

## 2023-09-24 ENCOUNTER — Other Ambulatory Visit: Payer: Self-pay | Admitting: Cardiovascular Disease

## 2023-09-24 DIAGNOSIS — I951 Orthostatic hypotension: Secondary | ICD-10-CM

## 2023-09-25 ENCOUNTER — Encounter: Payer: Self-pay | Admitting: Family Medicine

## 2023-09-26 ENCOUNTER — Other Ambulatory Visit: Payer: Self-pay | Admitting: *Deleted

## 2023-09-26 ENCOUNTER — Other Ambulatory Visit: Payer: Self-pay

## 2023-09-26 DIAGNOSIS — Z950 Presence of cardiac pacemaker: Secondary | ICD-10-CM

## 2023-09-26 DIAGNOSIS — Z79899 Other long term (current) drug therapy: Secondary | ICD-10-CM

## 2023-09-26 DIAGNOSIS — I25119 Atherosclerotic heart disease of native coronary artery with unspecified angina pectoris: Secondary | ICD-10-CM

## 2023-09-26 DIAGNOSIS — E1159 Type 2 diabetes mellitus with other circulatory complications: Secondary | ICD-10-CM

## 2023-09-26 DIAGNOSIS — E1165 Type 2 diabetes mellitus with hyperglycemia: Secondary | ICD-10-CM

## 2023-09-26 DIAGNOSIS — I480 Paroxysmal atrial fibrillation: Secondary | ICD-10-CM

## 2023-09-26 DIAGNOSIS — I2510 Atherosclerotic heart disease of native coronary artery without angina pectoris: Secondary | ICD-10-CM

## 2023-09-26 DIAGNOSIS — I152 Hypertension secondary to endocrine disorders: Secondary | ICD-10-CM

## 2023-09-26 MED ORDER — ROSUVASTATIN CALCIUM 20 MG PO TABS: 20.0000 mg | ORAL_TABLET | Freq: Every day | ORAL | 1 refills | Status: DC

## 2023-09-26 MED ORDER — GABAPENTIN 100 MG PO CAPS: ORAL_CAPSULE | ORAL | 1 refills | Status: DC

## 2023-09-26 MED ORDER — AMIODARONE HCL 200 MG PO TABS: ORAL_TABLET | ORAL | 1 refills | Status: DC

## 2023-09-26 NOTE — Telephone Encounter (Signed)
Rx send to [facility] pharmacy  ?

## 2023-10-10 ENCOUNTER — Ambulatory Visit (INDEPENDENT_AMBULATORY_CARE_PROVIDER_SITE_OTHER): Payer: Medicare Other

## 2023-10-10 VITALS — Ht 68.5 in | Wt 179.0 lb

## 2023-10-10 DIAGNOSIS — Z0000 Encounter for general adult medical examination without abnormal findings: Secondary | ICD-10-CM

## 2023-10-10 NOTE — Progress Notes (Addendum)
 Subjective:   Thomas [name] is a 89 y.o. who presents for a Medicare Wellness preventive visit.  Visit Complete: Virtual I connected with  Thomas [name] on 10/10/23 by a audio enabled telemedicine application and verified that I am speaking with the correct person using two identifiers.  Patient Location: Home  Provider Location: Office/Clinic  I discussed the limitations of evaluation and management by telemedicine. The patient expressed understanding and agreed to proceed.  Vital Signs: Because this visit was a virtual/telehealth visit, some criteria may be missing or patient reported. Any vitals not documented were not able to be obtained and vitals that have been documented are patient reported.  VideoDeclined- This patient declined Librarian, academic. Therefore the visit was completed with audio only.  Persons Participating in Visit: Patient assisted by wife Thomas [name] .  AWV Questionnaire: No: Patient Medicare AWV questionnaire was not completed prior to this visit.  Cardiac Risk Factors include: advanced age (>58men, >60 women);diabetes mellitus;dyslipidemia;hypertension;male gender;sedentary lifestyle     Objective:    Today's Vitals   10/10/23 1507  Weight: 179 lb (81.2 kg)  Height: 5' 8.5" (1.74 m)   Body mass index is 26.82 kg/m.     10/10/2023    3:13 PM 10/04/2022    3:40 PM 10/10/2021    3:37 PM 09/13/2021    3:59 AM 05/14/2021    1:14 PM 03/17/2021    7:36 PM 03/16/2021    5:04 PM  Advanced Directives  Does Patient Have a Medical Advance Directive? No No No No No  No  Would patient like information on creating a medical advance directive? No - Patient declined No - Patient declined Yes (ED - send information to MyChart)  No - Patient declined No - Patient declined     Current Medications (verified) Outpatient Encounter Medications as of 10/10/2023  Medication Sig   acetaminophen (TYLENOL) 325 MG tablet Take 650 mg by mouth every  6 (six) hours as needed for moderate pain or headache.   amiodarone (PACERONE) 200 MG tablet Take 1/2 tablet (100mg ) by mouth once daily   apixaban (ELIQUIS) 5 MG TABS tablet Take 1 tablet (5 mg total) by mouth 2 (two) times daily.   B-D UF III MINI PEN NEEDLES 31G X 5 MM MISC USE 1 DAILY WITH LANTUS PEN   carvedilol (COREG) 3.125 MG tablet Take 1 tablet (3.125 mg total) by mouth 2 (two) times daily as needed (take only if Systolic Blood Pressure greater than 140).   cholecalciferol (VITAMIN D3) 25 MCG (1000 UNIT) tablet Take 1,000 Units by mouth daily.   co-enzyme Q-10 50 MG capsule Take 50 mg by mouth daily.   furosemide (LASIX) 20 MG tablet TAKE 1 TABLET(20 MG) BY MOUTH DAILY   gabapentin (NEURONTIN) 100 MG capsule TAKE 2 CAPSULES(200 MG) BY MOUTH AT BEDTIME   glucosamine-chondroitin 500-400 MG tablet Take 1 tablet by mouth 3 (three) times daily.   LANTUS SOLOSTAR 100 UNIT/ML Solostar Pen ADMINISTER 13 UNITS UNDER THE SKIN DAILY   levothyroxine (SYNTHROID) 112 MCG tablet TAKE 1 TABLET(112 MCG) BY MOUTH DAILY BEFORE BREAKFAST   midodrine (PROAMATINE) 2.5 MG tablet Take 1 tablet (2.5 mg total) by mouth 3 (three) times daily with meals.   mirtazapine (REMERON SOL-TAB) 15 MG disintegrating tablet Take 1 tablet (15 mg total) by mouth at bedtime.   Multiple Vitamin (MULTIVITAMIN) tablet Take 1 tablet by mouth daily.   Red Yeast Rice Extract (RED YEAST RICE PO) Take 1 capsule by mouth in the  morning and at bedtime.   rosuvastatin (CRESTOR) 20 MG tablet Take 1 tablet (20 mg total) by mouth at bedtime.   tamsulosin (FLOMAX) 0.4 MG CAPS capsule Take 2 capsules (0.8 mg total) by mouth daily after supper.   No facility-administered encounter medications on file as of 10/10/2023.    Allergies (verified) Amitriptyline, Lyrica [pregabalin], Pravachol [pravastatin], Effexor xr [venlafaxine], Morphine, and Zolpidem   History: Past Medical History:  Diagnosis Date   Arthritis    Clotting disorder ([name])     Coronary artery disease    Diabetes ([name])    Hyperlipidemia    Hypertension    Hypothyroidism 07/17/2020   Kidney disease    Loss of hearing    Macular degeneration    Myocardial infarction [facility])    Osteoarthritis    Stroke [facility])    Past Surgical History:  Procedure Laterality Date   CARPAL TUNNEL RELEASE  2011   CORONARY ANGIOPLASTY WITH STENT PLACEMENT     CORONARY ARTERY BYPASS GRAFT  2001   x6   REPLACEMENT TOTAL KNEE     SPINE SURGERY  2008   cervical and lumbar   THYROIDECTOMY     Family History  Problem Relation Age of Onset   Stroke Mother    Stroke Father    Heart disease Sister    Cancer Daughter    Hypertension Son    Heart disease Brother    Cancer Brother    Heart disease Brother    Social History   Socioeconomic History   Marital status: Married    Spouse name: Not on file   Number of children: Not on file   Years of education: Not on file   Highest education level: Not on file  Occupational History   Not on file  Tobacco Use   Smoking status: Former    Types: Pipe   Smokeless tobacco: Never  Vaping Use   Vaping status: Never Used  Substance and Sexual Activity   Alcohol use: Never   Drug use: Never   Sexual activity: Not on file  Other Topics Concern   Not on file  Social History Narrative   Not on file   Social Drivers of Health   Financial Resource Strain: Low Risk  (10/10/2023)   Overall Financial Resource Strain (CARDIA)    Difficulty of Paying Living Expenses: Not hard at all  Food Insecurity: No Food Insecurity (10/10/2023)   Hunger Vital Sign    Worried About Running Out of Food in the Last Year: Never true    Ran Out of Food in the Last Year: Never true  Transportation Needs: No Transportation Needs (10/10/2023)   PRAPARE - Administrator, Civil Service (Medical): No    Lack of Transportation (Non-Medical): No  Physical Activity: Insufficiently Active (10/10/2023)   Exercise Vital Sign    Days of Exercise per  Week: 7 days    Minutes of Exercise per Session: 20 min  Stress: No Stress Concern Present (10/10/2023)   [employer] of Occupational Health - Occupational Stress Questionnaire    Feeling of Stress : Not at all  Social Connections: Moderately Integrated (10/10/2023)   Social Connection and Isolation Panel [NHANES]    Frequency of Communication with Friends and Family: More than three times a week    Frequency of Social Gatherings with Friends and Family: Once a week    Attends Religious Services: 1 to 4 times per year    Active Member of [employer] or Organizations:  No    Attends Banker Meetings: Never    Marital Status: Married    Tobacco Counseling Counseling given: Not Answered    Clinical Intake:  Pre-visit preparation completed: Yes  Pain : No/denies pain     BMI - recorded: 26.82 Nutritional Status: BMI 25 -29 Overweight Nutritional Risks: None Diabetes: Yes CBG done?: Yes (per pt 145) CBG resulted in Enter/ Edit results?: No Did pt. bring in CBG monitor from home?: No  Lab Results  Component Value Date   HGBA1C 8.7 (H) 08/04/2023   HGBA1C 7.7 (A) 01/31/2023   HGBA1C 8.1 (A) 07/30/2022     How often do you need to have someone help you when you read instructions, pamphlets, or other written materials from your doctor or pharmacy?: 1 - Never  Interpreter Needed?: No  Information entered by :: Lanier Ensign, LPN   Activities of Daily Living     10/10/2023    3:10 PM  In your present state of health, do you have any difficulty performing the following activities:  Hearing? 1  Vision? 0  Difficulty concentrating or making decisions? 0  Walking or climbing stairs? 0  Dressing or bathing? 1  Comment wife  Doing errands, shopping? 0  Preparing Food and eating ? Y  Comment wife  Using the Toilet? N  In the past six months, have you accidently leaked urine? N  Comment wears pads  Do you have problems with loss of bowel control? N  Managing  your Medications? Y  Comment wife  Managing your Finances? N  Housekeeping or managing your Housekeeping? Y  Comment wife    Patient Care Team: Ardith Dark, MD as PCP - General (Family Medicine) Nahser, Deloris Ping, MD as PCP - Cardiology (Cardiology) Erroll Luna, [facility] (Inactive) as Pharmacist (Pharmacist)  Indicate any recent Medical Services you may have received from other than [facility] providers in the past year (date may be approximate).     Assessment:   This is a routine wellness examination for Thomas [name].  Hearing/Vision screen Hearing Screening - Comments:: Pt has hearing aids  Vision Screening - Comments:: Pt denies any eye care at this time     Goals Addressed             This Visit's Progress    Patient Stated       Maintain health and activity for his age        Depression Screen     10/10/2023    3:14 PM 08/04/2023    9:26 AM 10/04/2022    3:39 PM 07/30/2022   11:12 AM 04/22/2022   10:49 AM 01/11/2022   11:06 AM 01/04/2022    3:01 PM  PHQ 2/9 Scores  PHQ - 2 Score 0 0 0 0 0 0 0    Fall Risk     10/10/2023    3:16 PM 08/04/2023    9:26 AM 10/04/2022    3:41 PM 07/30/2022   11:12 AM 04/22/2022   10:50 AM  Fall Risk   Falls in the past year? 1 0 1 1 0  Number falls in past yr: 1 0 1 0 0  Injury with Fall? 0 0 0 0 0  Risk for fall due to : Impaired balance/gait;Impaired mobility;History of fall(s);Impaired vision Impaired mobility Impaired balance/gait;Impaired mobility;Impaired vision No Fall Risks No Fall Risks  Risk for fall due to: Comment  WC USES WALKER    Follow up Falls prevention discussed  Falls prevention  discussed      MEDICARE RISK AT HOME:  Medicare Risk at Home Any stairs in or around the home?: No If so, are there any without handrails?: No Home free of loose throw rugs in walkways, pet beds, electrical cords, etc?: Yes Adequate lighting in your home to reduce risk of falls?: Yes Life alert?: Yes Use of a cane, walker or w/c?:  Yes Grab bars in the bathroom?: Yes Shower chair or bench in shower?: Yes Elevated toilet seat or a handicapped toilet?: Yes  TIMED UP AND GO:  Was the test performed?  No  Cognitive Function: Pt is very HOH unable to assess with completed AWV     10/10/2023    3:16 PM  MMSE - Mini Mental State Exam  Not completed: Unable to complete        Immunizations Immunization History  Administered Date(s) Administered   Fluad Quad(high Dose 65+) 04/22/2022   Influenza, High Dose Seasonal PF 04/29/2023   Influenza-Unspecified 03/17/2020   Moderna Sars-Covid-2 Vaccination 09/04/2019, 10/03/2019   PNEUMOCOCCAL CONJUGATE-20 08/04/2023   Pneumococcal Conjugate-13 05/27/2015   Zoster Recombinant(Shingrix) 03/27/2012, 06/28/2012    Screening Tests Health Maintenance  Topic Date Due   FOOT EXAM  Never done   OPHTHALMOLOGY EXAM  Never done   DTaP/Tdap/Td (1 - Tdap) 08/03/2024 (Originally 02/18/1948)   INFLUENZA VACCINE  01/27/2024   HEMOGLOBIN A1C  02/01/2024   Medicare Annual Wellness (AWV)  10/09/2024   Pneumonia Vaccine 43+ Years old  Completed   Zoster Vaccines- Shingrix  Completed   HPV VACCINES  Aged Out   Meningococcal B Vaccine  Aged Out   COVID-19 Vaccine  Discontinued    Health Maintenance  Health Maintenance Due  Topic Date Due   FOOT EXAM  Never done   OPHTHALMOLOGY EXAM  Never done   Health Maintenance Items Addressed: See Nurse Notes  Additional Screening:  Vision Screening: Recommended annual ophthalmology exams for early detection of glaucoma and other disorders of the eye.  Dental Screening: Recommended annual dental exams for proper oral hygiene  Community Resource Referral / Chronic Care Management: CRR required this visit?  No   CCM required this visit?  No     Plan:     I have personally reviewed and noted the following in the patient's chart:   Medical and social history Use of alcohol, tobacco or illicit drugs  Current medications and  supplements including opioid prescriptions. Patient is not currently taking opioid prescriptions. Functional ability and status Nutritional status Physical activity Advanced directives List of other physicians Hospitalizations, surgeries, and ER visits in previous 12 months Vitals Screenings to include cognitive, depression, and falls Referrals and appointments  In addition, I have reviewed and discussed with patient certain preventive protocols, quality metrics, and best practice recommendations. A written personalized care plan for preventive services as well as general preventive health recommendations were provided to patient.     Bruno Capri, LPN   1/61/0960   After Visit Summary: (MyChart) Due to this being a telephonic visit, the after visit summary with patients personalized plan was offered to patient via MyChart   Notes: Nothing significant to report at this time.

## 2023-10-10 NOTE — Patient Instructions (Signed)
 Thomas [name] , Thank you for taking time to come for your Medicare Wellness Visit. I appreciate your ongoing commitment to your health goals. Please review the following plan we discussed and let me know if I can assist you in the future.   Referrals/Orders/Follow-Ups/Clinician Recommendations: Each day, aim for 6 glasses of water, plenty of protein in your diet and try to get up and walk/ stretch every hour for 5-10 minutes at a time.  Maintain health and activity for your age   This is a list of the screening recommended for you and due dates:  Health Maintenance  Topic Date Due   Complete foot exam   Never done   Eye exam for diabetics  Never done   DTaP/Tdap/Td vaccine (1 - Tdap) 08/03/2024*   Flu Shot  01/27/2024   Hemoglobin A1C  02/01/2024   Medicare Annual Wellness Visit  10/09/2024   Pneumonia Vaccine  Completed   Zoster (Shingles) Vaccine  Completed   HPV Vaccine  Aged Out   Meningitis B Vaccine  Aged Out   COVID-19 Vaccine  Discontinued  *Topic was postponed. The date shown is not the original due date.    Advanced directives: (Declined) Advance directive discussed with you today. Even though you declined this today, please call our office should you change your mind, and we can give you the proper paperwork for you to fill out.  Next Medicare Annual Wellness Visit scheduled for next year: Yes

## 2023-10-12 ENCOUNTER — Other Ambulatory Visit: Payer: Self-pay | Admitting: *Deleted

## 2023-10-12 MED ORDER — BD PEN NEEDLE MINI U/F 31G X 5 MM MISC: 3 refills | Status: DC

## 2023-10-28 ENCOUNTER — Ambulatory Visit (INDEPENDENT_AMBULATORY_CARE_PROVIDER_SITE_OTHER): Payer: Medicare Other

## 2023-10-28 DIAGNOSIS — I495 Sick sinus syndrome: Secondary | ICD-10-CM

## 2023-10-28 LAB — CUP PACEART REMOTE DEVICE CHECK
Battery Remaining Longevity: 109 mo
Battery Voltage: 2.99 V
Brady Statistic AP VP Percent: 0.04 %
Brady Statistic AP VS Percent: 96.25 %
Brady Statistic AS VP Percent: 0 %
Brady Statistic AS VS Percent: 3.71 %
Brady Statistic RA Percent Paced: 96.31 %
Brady Statistic RV Percent Paced: 0.04 %
Date Time Interrogation Session: 20250502141502
Implantable Lead Connection Status: 753985
Implantable Lead Connection Status: 753985
Implantable Lead Implant Date: 20081106
Implantable Lead Implant Date: 20210210
Implantable Lead Location: 753859
Implantable Lead Location: 753860
Implantable Lead Model: 4076
Implantable Lead Model: 4076
Implantable Pulse Generator Implant Date: 20210210
Lead Channel Impedance Value: 285 Ohm
Lead Channel Impedance Value: 361 Ohm
Lead Channel Impedance Value: 380 Ohm
Lead Channel Impedance Value: 513 Ohm
Lead Channel Pacing Threshold Amplitude: 0.75 V
Lead Channel Pacing Threshold Amplitude: 0.875 V
Lead Channel Pacing Threshold Pulse Width: 0.4 ms
Lead Channel Pacing Threshold Pulse Width: 0.4 ms
Lead Channel Sensing Intrinsic Amplitude: 1.5 mV
Lead Channel Sensing Intrinsic Amplitude: 1.5 mV
Lead Channel Sensing Intrinsic Amplitude: 12.75 mV
Lead Channel Sensing Intrinsic Amplitude: 12.75 mV
Lead Channel Setting Pacing Amplitude: 1.5 V
Lead Channel Setting Pacing Amplitude: 2 V
Lead Channel Setting Pacing Pulse Width: 0.4 ms
Lead Channel Setting Sensing Sensitivity: 0.9 mV
Zone Setting Status: 755011
Zone Setting Status: 755011

## 2023-10-30 ENCOUNTER — Encounter: Payer: Self-pay | Admitting: Internal Medicine

## 2023-11-01 ENCOUNTER — Encounter: Payer: Self-pay | Admitting: Family Medicine

## 2023-11-01 ENCOUNTER — Ambulatory Visit (INDEPENDENT_AMBULATORY_CARE_PROVIDER_SITE_OTHER): Payer: Medicare Other | Admitting: Family Medicine

## 2023-11-01 VITALS — BP 115/64 | HR 72 | Temp 97.7°F | Ht 68.5 in | Wt 181.0 lb

## 2023-11-01 DIAGNOSIS — E1159 Type 2 diabetes mellitus with other circulatory complications: Secondary | ICD-10-CM | POA: Diagnosis not present

## 2023-11-01 DIAGNOSIS — E1165 Type 2 diabetes mellitus with hyperglycemia: Secondary | ICD-10-CM

## 2023-11-01 DIAGNOSIS — N390 Urinary tract infection, site not specified: Secondary | ICD-10-CM

## 2023-11-01 DIAGNOSIS — Z794 Long term (current) use of insulin: Secondary | ICD-10-CM

## 2023-11-01 DIAGNOSIS — E039 Hypothyroidism, unspecified: Secondary | ICD-10-CM | POA: Diagnosis not present

## 2023-11-01 DIAGNOSIS — I152 Hypertension secondary to endocrine disorders: Secondary | ICD-10-CM

## 2023-11-01 LAB — POCT GLYCOSYLATED HEMOGLOBIN (HGB A1C): Hemoglobin A1C: 8.5 % — AB (ref 4.0–5.6)

## 2023-11-01 NOTE — Assessment & Plan Note (Addendum)
 Mildly elevated today initially however at goal on recheck.Charlies Contes with cardiology for labile blood pressure readings.  Typically well-controlled on regimen per cardiology with Coreg  3.125 mg twice daily and midodrine  2.5 mg 3 times daily.

## 2023-11-01 NOTE — Assessment & Plan Note (Signed)
 A1c stable at 8.5 on Lantus  15 units daily.  Recheck in 3 months.

## 2023-11-01 NOTE — Patient Instructions (Signed)
 It was very nice to see you today!  Your sugar today is 8.5.  Please continue your current dose of Lantus .  Will check your thyroid  level today.    No other changes today.  I will see you back in 3 months.  Come back sooner if needed.  Return in about 3 months (around 02/01/2024) for Follow Up.   Take care, Dr [name]  PLEASE NOTE:  If you had any lab tests, please let us  know if you have not heard back within a few days. You may see your results on mychart before we have a chance to review them but we will give you a call once they are reviewed by us .   If we ordered any referrals today, please let us  know if you have not heard from their office within the next week.   If you had any urgent prescriptions sent in today, please check with the pharmacy within an hour of our visit to make sure the prescription was transmitted appropriately.   Please try these tips to maintain a healthy lifestyle:  Eat at least 3 REAL meals and 1-2 snacks per day.  Aim for no more than 5 hours between eating.  If you eat breakfast, please do so within one hour of getting up.   Each meal should contain half fruits/vegetables, one quarter protein, and one quarter carbs (no bigger than a computer mouse)  Cut down on sweet beverages. This includes juice, soda, and sweet tea.   Drink at least 1 glass of water with each meal and aim for at least 8 glasses per day  Exercise at least 150 minutes every week.

## 2023-11-01 NOTE — Assessment & Plan Note (Signed)
 Doing well with higher dose of Synthroid  112 mcg daily.  Recheck TSH today.

## 2023-11-01 NOTE — Progress Notes (Signed)
   Thomas [name] is a 89 y.o. male who presents today for an office visit.  Assessment/Plan:  New/Acute Problems: Urinary tract infection Symptoms have resolved with Omnicef .  Will recheck urine culture today per patient request.  Chronic Problems Addressed Today: Type 2 diabetes mellitus with hyperglycemia ([name]) A1c stable at 8.5 on Lantus  15 units daily.  Recheck in 3 months.  Hypothyroidism Doing well with higher dose of Synthroid  112 mcg daily.  Recheck TSH today.  Hypertension associated with diabetes ([name]) Mildly elevated today initially however at goal on recheck.Thomas [name] with cardiology for labile blood pressure readings.  Typically well-controlled on regimen per cardiology with Coreg  3.125 mg twice daily and midodrine  2.5 mg 3 times daily.     Subjective:  HPI:  See Assessment / plan for status of chronic conditions.  Patient is here today for follow-up.  I saw him 3 months ago.  A1c.  3 months ago was 8.7.  He was continued on Lantus  15 units daily.  At his last visit we also increased his Flomax  to 0.8 mg daily for BPH though he was found to have a urinary tract infection that was treated with Omnicef .  Symptoms improved with antibiotics.        Objective:  Physical Exam: BP 115/64   Pulse 72   Temp 97.7 F (36.5 C) (Temporal)   Ht 5' 8.5" (1.74 m)   Wt 181 lb (82.1 kg)   SpO2 97%   BMI 27.12 kg/m   Gen: No acute distress, resting comfortably CV: Regular rate and rhythm with no murmurs appreciated Pulm: Normal work of breathing, clear to auscultation bilaterally with no crackles, wheezes, or rhonchi Neuro: Grossly normal, moves all extremities Psych: Normal affect and thought content      Thomas [name] M. [name], MD 11/01/2023 3:02 PM

## 2023-11-02 LAB — URINE CULTURE
MICRO NUMBER:: 16419308
SPECIMEN QUALITY:: ADEQUATE

## 2023-11-02 LAB — TSH: TSH: 3.35 u[IU]/mL (ref 0.35–5.50)

## 2023-11-03 ENCOUNTER — Encounter: Payer: Self-pay | Admitting: Family Medicine

## 2023-11-03 ENCOUNTER — Other Ambulatory Visit: Payer: Self-pay | Admitting: Family Medicine

## 2023-11-03 DIAGNOSIS — R5381 Other malaise: Secondary | ICD-10-CM

## 2023-11-03 NOTE — Progress Notes (Signed)
 Had discussion with patient's wife and son today regarding instability.  Last time in office 2 days ago.  They are interested in pursuing home health for evaluation and management of his chronic conditions as well as personal care.  Will place referral today.

## 2023-11-03 NOTE — Progress Notes (Signed)
 Urine culture is negative.  He does not have a UTI.    His thyroid  levels are back at goal.  He can continue with his current dose and we can recheck again in 6 to 12 months.

## 2023-11-07 DIAGNOSIS — Z955 Presence of coronary angioplasty implant and graft: Secondary | ICD-10-CM | POA: Diagnosis not present

## 2023-11-07 DIAGNOSIS — Z8673 Personal history of transient ischemic attack (TIA), and cerebral infarction without residual deficits: Secondary | ICD-10-CM | POA: Diagnosis not present

## 2023-11-07 DIAGNOSIS — Z950 Presence of cardiac pacemaker: Secondary | ICD-10-CM | POA: Diagnosis not present

## 2023-11-07 DIAGNOSIS — H9193 Unspecified hearing loss, bilateral: Secondary | ICD-10-CM | POA: Diagnosis not present

## 2023-11-07 DIAGNOSIS — D649 Anemia, unspecified: Secondary | ICD-10-CM | POA: Diagnosis not present

## 2023-11-07 DIAGNOSIS — E039 Hypothyroidism, unspecified: Secondary | ICD-10-CM | POA: Diagnosis not present

## 2023-11-07 DIAGNOSIS — I152 Hypertension secondary to endocrine disorders: Secondary | ICD-10-CM | POA: Diagnosis not present

## 2023-11-07 DIAGNOSIS — Z556 Problems related to health literacy: Secondary | ICD-10-CM | POA: Diagnosis not present

## 2023-11-07 DIAGNOSIS — H3530 Unspecified macular degeneration: Secondary | ICD-10-CM | POA: Diagnosis not present

## 2023-11-07 DIAGNOSIS — Z794 Long term (current) use of insulin: Secondary | ICD-10-CM | POA: Diagnosis not present

## 2023-11-07 DIAGNOSIS — N400 Enlarged prostate without lower urinary tract symptoms: Secondary | ICD-10-CM | POA: Diagnosis not present

## 2023-11-07 DIAGNOSIS — I495 Sick sinus syndrome: Secondary | ICD-10-CM | POA: Diagnosis not present

## 2023-11-07 DIAGNOSIS — E1159 Type 2 diabetes mellitus with other circulatory complications: Secondary | ICD-10-CM | POA: Diagnosis not present

## 2023-11-07 DIAGNOSIS — I951 Orthostatic hypotension: Secondary | ICD-10-CM | POA: Diagnosis not present

## 2023-11-07 DIAGNOSIS — Z974 Presence of external hearing-aid: Secondary | ICD-10-CM | POA: Diagnosis not present

## 2023-11-07 DIAGNOSIS — Z86718 Personal history of other venous thrombosis and embolism: Secondary | ICD-10-CM | POA: Diagnosis not present

## 2023-11-07 DIAGNOSIS — E1165 Type 2 diabetes mellitus with hyperglycemia: Secondary | ICD-10-CM | POA: Diagnosis not present

## 2023-11-07 DIAGNOSIS — Z9181 History of falling: Secondary | ICD-10-CM | POA: Diagnosis not present

## 2023-11-07 DIAGNOSIS — Z7901 Long term (current) use of anticoagulants: Secondary | ICD-10-CM | POA: Diagnosis not present

## 2023-11-07 DIAGNOSIS — I2510 Atherosclerotic heart disease of native coronary artery without angina pectoris: Secondary | ICD-10-CM | POA: Diagnosis not present

## 2023-11-07 DIAGNOSIS — Z87440 Personal history of urinary (tract) infections: Secondary | ICD-10-CM | POA: Diagnosis not present

## 2023-11-07 DIAGNOSIS — M1990 Unspecified osteoarthritis, unspecified site: Secondary | ICD-10-CM | POA: Diagnosis not present

## 2023-11-10 DIAGNOSIS — E1159 Type 2 diabetes mellitus with other circulatory complications: Secondary | ICD-10-CM | POA: Diagnosis not present

## 2023-11-10 DIAGNOSIS — Z8673 Personal history of transient ischemic attack (TIA), and cerebral infarction without residual deficits: Secondary | ICD-10-CM | POA: Diagnosis not present

## 2023-11-10 DIAGNOSIS — I495 Sick sinus syndrome: Secondary | ICD-10-CM | POA: Diagnosis not present

## 2023-11-10 DIAGNOSIS — I152 Hypertension secondary to endocrine disorders: Secondary | ICD-10-CM | POA: Diagnosis not present

## 2023-11-10 DIAGNOSIS — N400 Enlarged prostate without lower urinary tract symptoms: Secondary | ICD-10-CM | POA: Diagnosis not present

## 2023-11-10 DIAGNOSIS — Z9181 History of falling: Secondary | ICD-10-CM | POA: Diagnosis not present

## 2023-11-10 DIAGNOSIS — Z86718 Personal history of other venous thrombosis and embolism: Secondary | ICD-10-CM | POA: Diagnosis not present

## 2023-11-10 DIAGNOSIS — E1165 Type 2 diabetes mellitus with hyperglycemia: Secondary | ICD-10-CM | POA: Diagnosis not present

## 2023-11-10 DIAGNOSIS — Z974 Presence of external hearing-aid: Secondary | ICD-10-CM | POA: Diagnosis not present

## 2023-11-10 DIAGNOSIS — Z794 Long term (current) use of insulin: Secondary | ICD-10-CM | POA: Diagnosis not present

## 2023-11-10 DIAGNOSIS — I951 Orthostatic hypotension: Secondary | ICD-10-CM | POA: Diagnosis not present

## 2023-11-10 DIAGNOSIS — I2510 Atherosclerotic heart disease of native coronary artery without angina pectoris: Secondary | ICD-10-CM | POA: Diagnosis not present

## 2023-11-10 DIAGNOSIS — H9193 Unspecified hearing loss, bilateral: Secondary | ICD-10-CM | POA: Diagnosis not present

## 2023-11-10 DIAGNOSIS — D649 Anemia, unspecified: Secondary | ICD-10-CM | POA: Diagnosis not present

## 2023-11-10 DIAGNOSIS — Z950 Presence of cardiac pacemaker: Secondary | ICD-10-CM | POA: Diagnosis not present

## 2023-11-10 DIAGNOSIS — Z955 Presence of coronary angioplasty implant and graft: Secondary | ICD-10-CM | POA: Diagnosis not present

## 2023-11-10 DIAGNOSIS — H3530 Unspecified macular degeneration: Secondary | ICD-10-CM | POA: Diagnosis not present

## 2023-11-10 DIAGNOSIS — E039 Hypothyroidism, unspecified: Secondary | ICD-10-CM | POA: Diagnosis not present

## 2023-11-10 DIAGNOSIS — Z7901 Long term (current) use of anticoagulants: Secondary | ICD-10-CM | POA: Diagnosis not present

## 2023-11-10 DIAGNOSIS — Z87440 Personal history of urinary (tract) infections: Secondary | ICD-10-CM | POA: Diagnosis not present

## 2023-11-10 DIAGNOSIS — M1990 Unspecified osteoarthritis, unspecified site: Secondary | ICD-10-CM | POA: Diagnosis not present

## 2023-11-10 DIAGNOSIS — Z556 Problems related to health literacy: Secondary | ICD-10-CM | POA: Diagnosis not present

## 2023-11-15 ENCOUNTER — Telehealth: Payer: Self-pay | Admitting: Family Medicine

## 2023-11-15 DIAGNOSIS — E1159 Type 2 diabetes mellitus with other circulatory complications: Secondary | ICD-10-CM | POA: Diagnosis not present

## 2023-11-15 DIAGNOSIS — D649 Anemia, unspecified: Secondary | ICD-10-CM | POA: Diagnosis not present

## 2023-11-15 DIAGNOSIS — Z794 Long term (current) use of insulin: Secondary | ICD-10-CM | POA: Diagnosis not present

## 2023-11-15 DIAGNOSIS — E039 Hypothyroidism, unspecified: Secondary | ICD-10-CM | POA: Diagnosis not present

## 2023-11-15 DIAGNOSIS — Z86718 Personal history of other venous thrombosis and embolism: Secondary | ICD-10-CM | POA: Diagnosis not present

## 2023-11-15 DIAGNOSIS — H3530 Unspecified macular degeneration: Secondary | ICD-10-CM | POA: Diagnosis not present

## 2023-11-15 DIAGNOSIS — Z7901 Long term (current) use of anticoagulants: Secondary | ICD-10-CM | POA: Diagnosis not present

## 2023-11-15 DIAGNOSIS — I951 Orthostatic hypotension: Secondary | ICD-10-CM | POA: Diagnosis not present

## 2023-11-15 DIAGNOSIS — I2510 Atherosclerotic heart disease of native coronary artery without angina pectoris: Secondary | ICD-10-CM | POA: Diagnosis not present

## 2023-11-15 DIAGNOSIS — I495 Sick sinus syndrome: Secondary | ICD-10-CM | POA: Diagnosis not present

## 2023-11-15 DIAGNOSIS — Z556 Problems related to health literacy: Secondary | ICD-10-CM | POA: Diagnosis not present

## 2023-11-15 DIAGNOSIS — Z9181 History of falling: Secondary | ICD-10-CM | POA: Diagnosis not present

## 2023-11-15 DIAGNOSIS — E1165 Type 2 diabetes mellitus with hyperglycemia: Secondary | ICD-10-CM | POA: Diagnosis not present

## 2023-11-15 DIAGNOSIS — Z950 Presence of cardiac pacemaker: Secondary | ICD-10-CM | POA: Diagnosis not present

## 2023-11-15 DIAGNOSIS — H9193 Unspecified hearing loss, bilateral: Secondary | ICD-10-CM | POA: Diagnosis not present

## 2023-11-15 DIAGNOSIS — Z955 Presence of coronary angioplasty implant and graft: Secondary | ICD-10-CM | POA: Diagnosis not present

## 2023-11-15 DIAGNOSIS — M1990 Unspecified osteoarthritis, unspecified site: Secondary | ICD-10-CM | POA: Diagnosis not present

## 2023-11-15 DIAGNOSIS — N400 Enlarged prostate without lower urinary tract symptoms: Secondary | ICD-10-CM | POA: Diagnosis not present

## 2023-11-15 DIAGNOSIS — I152 Hypertension secondary to endocrine disorders: Secondary | ICD-10-CM | POA: Diagnosis not present

## 2023-11-15 DIAGNOSIS — Z87440 Personal history of urinary (tract) infections: Secondary | ICD-10-CM | POA: Diagnosis not present

## 2023-11-15 DIAGNOSIS — Z8673 Personal history of transient ischemic attack (TIA), and cerebral infarction without residual deficits: Secondary | ICD-10-CM | POA: Diagnosis not present

## 2023-11-15 DIAGNOSIS — Z974 Presence of external hearing-aid: Secondary | ICD-10-CM | POA: Diagnosis not present

## 2023-11-15 NOTE — Telephone Encounter (Signed)
 Ddocument Home Health Certificate (Order ID [id number]), to be filled out by provider. Patient requested to send it back via Fax within 5-days. Document is located in providers tray at front office.Please advise

## 2023-11-16 DIAGNOSIS — I152 Hypertension secondary to endocrine disorders: Secondary | ICD-10-CM | POA: Diagnosis not present

## 2023-11-16 DIAGNOSIS — Z950 Presence of cardiac pacemaker: Secondary | ICD-10-CM | POA: Diagnosis not present

## 2023-11-16 DIAGNOSIS — M1990 Unspecified osteoarthritis, unspecified site: Secondary | ICD-10-CM

## 2023-11-16 DIAGNOSIS — I951 Orthostatic hypotension: Secondary | ICD-10-CM

## 2023-11-16 DIAGNOSIS — Z556 Problems related to health literacy: Secondary | ICD-10-CM | POA: Diagnosis not present

## 2023-11-16 DIAGNOSIS — E1159 Type 2 diabetes mellitus with other circulatory complications: Secondary | ICD-10-CM | POA: Diagnosis not present

## 2023-11-16 DIAGNOSIS — H9193 Unspecified hearing loss, bilateral: Secondary | ICD-10-CM

## 2023-11-16 DIAGNOSIS — H3530 Unspecified macular degeneration: Secondary | ICD-10-CM

## 2023-11-16 DIAGNOSIS — Z974 Presence of external hearing-aid: Secondary | ICD-10-CM | POA: Diagnosis not present

## 2023-11-16 DIAGNOSIS — D649 Anemia, unspecified: Secondary | ICD-10-CM

## 2023-11-16 DIAGNOSIS — Z86718 Personal history of other venous thrombosis and embolism: Secondary | ICD-10-CM | POA: Diagnosis not present

## 2023-11-16 DIAGNOSIS — N400 Enlarged prostate without lower urinary tract symptoms: Secondary | ICD-10-CM

## 2023-11-16 DIAGNOSIS — I495 Sick sinus syndrome: Secondary | ICD-10-CM

## 2023-11-16 DIAGNOSIS — E039 Hypothyroidism, unspecified: Secondary | ICD-10-CM

## 2023-11-16 DIAGNOSIS — Z794 Long term (current) use of insulin: Secondary | ICD-10-CM | POA: Diagnosis not present

## 2023-11-16 DIAGNOSIS — Z7901 Long term (current) use of anticoagulants: Secondary | ICD-10-CM | POA: Diagnosis not present

## 2023-11-16 DIAGNOSIS — E1165 Type 2 diabetes mellitus with hyperglycemia: Secondary | ICD-10-CM | POA: Diagnosis not present

## 2023-11-16 DIAGNOSIS — Z87440 Personal history of urinary (tract) infections: Secondary | ICD-10-CM | POA: Diagnosis not present

## 2023-11-16 DIAGNOSIS — Z955 Presence of coronary angioplasty implant and graft: Secondary | ICD-10-CM | POA: Diagnosis not present

## 2023-11-16 DIAGNOSIS — Z9181 History of falling: Secondary | ICD-10-CM | POA: Diagnosis not present

## 2023-11-16 DIAGNOSIS — Z8673 Personal history of transient ischemic attack (TIA), and cerebral infarction without residual deficits: Secondary | ICD-10-CM | POA: Diagnosis not present

## 2023-11-16 DIAGNOSIS — I2510 Atherosclerotic heart disease of native coronary artery without angina pectoris: Secondary | ICD-10-CM | POA: Diagnosis not present

## 2023-11-16 NOTE — Telephone Encounter (Signed)
Form placed to be reviewed in PCP office

## 2023-11-17 DIAGNOSIS — M1990 Unspecified osteoarthritis, unspecified site: Secondary | ICD-10-CM | POA: Diagnosis not present

## 2023-11-17 DIAGNOSIS — E1165 Type 2 diabetes mellitus with hyperglycemia: Secondary | ICD-10-CM | POA: Diagnosis not present

## 2023-11-17 DIAGNOSIS — Z8673 Personal history of transient ischemic attack (TIA), and cerebral infarction without residual deficits: Secondary | ICD-10-CM | POA: Diagnosis not present

## 2023-11-17 DIAGNOSIS — E1159 Type 2 diabetes mellitus with other circulatory complications: Secondary | ICD-10-CM | POA: Diagnosis not present

## 2023-11-17 DIAGNOSIS — Z955 Presence of coronary angioplasty implant and graft: Secondary | ICD-10-CM | POA: Diagnosis not present

## 2023-11-17 DIAGNOSIS — N400 Enlarged prostate without lower urinary tract symptoms: Secondary | ICD-10-CM | POA: Diagnosis not present

## 2023-11-17 DIAGNOSIS — Z7901 Long term (current) use of anticoagulants: Secondary | ICD-10-CM | POA: Diagnosis not present

## 2023-11-17 DIAGNOSIS — I152 Hypertension secondary to endocrine disorders: Secondary | ICD-10-CM | POA: Diagnosis not present

## 2023-11-17 DIAGNOSIS — I2510 Atherosclerotic heart disease of native coronary artery without angina pectoris: Secondary | ICD-10-CM | POA: Diagnosis not present

## 2023-11-17 DIAGNOSIS — Z9181 History of falling: Secondary | ICD-10-CM | POA: Diagnosis not present

## 2023-11-17 DIAGNOSIS — D649 Anemia, unspecified: Secondary | ICD-10-CM | POA: Diagnosis not present

## 2023-11-17 DIAGNOSIS — Z950 Presence of cardiac pacemaker: Secondary | ICD-10-CM | POA: Diagnosis not present

## 2023-11-17 DIAGNOSIS — E039 Hypothyroidism, unspecified: Secondary | ICD-10-CM | POA: Diagnosis not present

## 2023-11-17 DIAGNOSIS — Z794 Long term (current) use of insulin: Secondary | ICD-10-CM | POA: Diagnosis not present

## 2023-11-17 DIAGNOSIS — Z974 Presence of external hearing-aid: Secondary | ICD-10-CM | POA: Diagnosis not present

## 2023-11-17 DIAGNOSIS — H3530 Unspecified macular degeneration: Secondary | ICD-10-CM | POA: Diagnosis not present

## 2023-11-17 DIAGNOSIS — I951 Orthostatic hypotension: Secondary | ICD-10-CM | POA: Diagnosis not present

## 2023-11-17 DIAGNOSIS — Z87440 Personal history of urinary (tract) infections: Secondary | ICD-10-CM | POA: Diagnosis not present

## 2023-11-17 DIAGNOSIS — H9193 Unspecified hearing loss, bilateral: Secondary | ICD-10-CM | POA: Diagnosis not present

## 2023-11-17 DIAGNOSIS — Z556 Problems related to health literacy: Secondary | ICD-10-CM | POA: Diagnosis not present

## 2023-11-17 DIAGNOSIS — Z86718 Personal history of other venous thrombosis and embolism: Secondary | ICD-10-CM | POA: Diagnosis not present

## 2023-11-17 DIAGNOSIS — I495 Sick sinus syndrome: Secondary | ICD-10-CM | POA: Diagnosis not present

## 2023-11-18 ENCOUNTER — Other Ambulatory Visit: Payer: Self-pay | Admitting: *Deleted

## 2023-11-18 DIAGNOSIS — I2510 Atherosclerotic heart disease of native coronary artery without angina pectoris: Secondary | ICD-10-CM | POA: Diagnosis not present

## 2023-11-18 DIAGNOSIS — I951 Orthostatic hypotension: Secondary | ICD-10-CM | POA: Diagnosis not present

## 2023-11-18 DIAGNOSIS — Z950 Presence of cardiac pacemaker: Secondary | ICD-10-CM | POA: Diagnosis not present

## 2023-11-18 DIAGNOSIS — Z974 Presence of external hearing-aid: Secondary | ICD-10-CM | POA: Diagnosis not present

## 2023-11-18 DIAGNOSIS — E1159 Type 2 diabetes mellitus with other circulatory complications: Secondary | ICD-10-CM | POA: Diagnosis not present

## 2023-11-18 DIAGNOSIS — Z87440 Personal history of urinary (tract) infections: Secondary | ICD-10-CM | POA: Diagnosis not present

## 2023-11-18 DIAGNOSIS — Z8673 Personal history of transient ischemic attack (TIA), and cerebral infarction without residual deficits: Secondary | ICD-10-CM | POA: Diagnosis not present

## 2023-11-18 DIAGNOSIS — Z9181 History of falling: Secondary | ICD-10-CM | POA: Diagnosis not present

## 2023-11-18 DIAGNOSIS — N400 Enlarged prostate without lower urinary tract symptoms: Secondary | ICD-10-CM | POA: Diagnosis not present

## 2023-11-18 DIAGNOSIS — E039 Hypothyroidism, unspecified: Secondary | ICD-10-CM | POA: Diagnosis not present

## 2023-11-18 DIAGNOSIS — Z955 Presence of coronary angioplasty implant and graft: Secondary | ICD-10-CM | POA: Diagnosis not present

## 2023-11-18 DIAGNOSIS — I152 Hypertension secondary to endocrine disorders: Secondary | ICD-10-CM | POA: Diagnosis not present

## 2023-11-18 DIAGNOSIS — M1990 Unspecified osteoarthritis, unspecified site: Secondary | ICD-10-CM | POA: Diagnosis not present

## 2023-11-18 DIAGNOSIS — H9193 Unspecified hearing loss, bilateral: Secondary | ICD-10-CM | POA: Diagnosis not present

## 2023-11-18 DIAGNOSIS — Z794 Long term (current) use of insulin: Secondary | ICD-10-CM | POA: Diagnosis not present

## 2023-11-18 DIAGNOSIS — D649 Anemia, unspecified: Secondary | ICD-10-CM | POA: Diagnosis not present

## 2023-11-18 DIAGNOSIS — E1165 Type 2 diabetes mellitus with hyperglycemia: Secondary | ICD-10-CM | POA: Diagnosis not present

## 2023-11-18 DIAGNOSIS — H3530 Unspecified macular degeneration: Secondary | ICD-10-CM | POA: Diagnosis not present

## 2023-11-18 DIAGNOSIS — Z86718 Personal history of other venous thrombosis and embolism: Secondary | ICD-10-CM | POA: Diagnosis not present

## 2023-11-18 DIAGNOSIS — Z556 Problems related to health literacy: Secondary | ICD-10-CM | POA: Diagnosis not present

## 2023-11-18 DIAGNOSIS — I495 Sick sinus syndrome: Secondary | ICD-10-CM | POA: Diagnosis not present

## 2023-11-18 DIAGNOSIS — Z7901 Long term (current) use of anticoagulants: Secondary | ICD-10-CM | POA: Diagnosis not present

## 2023-11-18 MED ORDER — FUROSEMIDE 20 MG PO TABS: 20.0000 mg | ORAL_TABLET | Freq: Every day | ORAL | 3 refills | Status: DC

## 2023-11-18 NOTE — Telephone Encounter (Signed)
Form faxed to [phone] Form placed to be scan in patient chart

## 2023-11-21 DIAGNOSIS — Z955 Presence of coronary angioplasty implant and graft: Secondary | ICD-10-CM | POA: Diagnosis not present

## 2023-11-21 DIAGNOSIS — Z7901 Long term (current) use of anticoagulants: Secondary | ICD-10-CM | POA: Diagnosis not present

## 2023-11-21 DIAGNOSIS — E039 Hypothyroidism, unspecified: Secondary | ICD-10-CM | POA: Diagnosis not present

## 2023-11-21 DIAGNOSIS — Z794 Long term (current) use of insulin: Secondary | ICD-10-CM | POA: Diagnosis not present

## 2023-11-21 DIAGNOSIS — Z556 Problems related to health literacy: Secondary | ICD-10-CM | POA: Diagnosis not present

## 2023-11-21 DIAGNOSIS — M1990 Unspecified osteoarthritis, unspecified site: Secondary | ICD-10-CM | POA: Diagnosis not present

## 2023-11-21 DIAGNOSIS — Z87440 Personal history of urinary (tract) infections: Secondary | ICD-10-CM | POA: Diagnosis not present

## 2023-11-21 DIAGNOSIS — H3530 Unspecified macular degeneration: Secondary | ICD-10-CM | POA: Diagnosis not present

## 2023-11-21 DIAGNOSIS — N400 Enlarged prostate without lower urinary tract symptoms: Secondary | ICD-10-CM | POA: Diagnosis not present

## 2023-11-21 DIAGNOSIS — D649 Anemia, unspecified: Secondary | ICD-10-CM | POA: Diagnosis not present

## 2023-11-21 DIAGNOSIS — Z8673 Personal history of transient ischemic attack (TIA), and cerebral infarction without residual deficits: Secondary | ICD-10-CM | POA: Diagnosis not present

## 2023-11-21 DIAGNOSIS — Z950 Presence of cardiac pacemaker: Secondary | ICD-10-CM | POA: Diagnosis not present

## 2023-11-21 DIAGNOSIS — Z9181 History of falling: Secondary | ICD-10-CM | POA: Diagnosis not present

## 2023-11-21 DIAGNOSIS — H9193 Unspecified hearing loss, bilateral: Secondary | ICD-10-CM | POA: Diagnosis not present

## 2023-11-21 DIAGNOSIS — E1159 Type 2 diabetes mellitus with other circulatory complications: Secondary | ICD-10-CM | POA: Diagnosis not present

## 2023-11-21 DIAGNOSIS — Z86718 Personal history of other venous thrombosis and embolism: Secondary | ICD-10-CM | POA: Diagnosis not present

## 2023-11-21 DIAGNOSIS — I152 Hypertension secondary to endocrine disorders: Secondary | ICD-10-CM | POA: Diagnosis not present

## 2023-11-21 DIAGNOSIS — I2510 Atherosclerotic heart disease of native coronary artery without angina pectoris: Secondary | ICD-10-CM | POA: Diagnosis not present

## 2023-11-21 DIAGNOSIS — I951 Orthostatic hypotension: Secondary | ICD-10-CM | POA: Diagnosis not present

## 2023-11-21 DIAGNOSIS — Z974 Presence of external hearing-aid: Secondary | ICD-10-CM | POA: Diagnosis not present

## 2023-11-21 DIAGNOSIS — E1165 Type 2 diabetes mellitus with hyperglycemia: Secondary | ICD-10-CM | POA: Diagnosis not present

## 2023-11-21 DIAGNOSIS — I495 Sick sinus syndrome: Secondary | ICD-10-CM | POA: Diagnosis not present

## 2023-11-22 DIAGNOSIS — I2510 Atherosclerotic heart disease of native coronary artery without angina pectoris: Secondary | ICD-10-CM | POA: Diagnosis not present

## 2023-11-22 DIAGNOSIS — M1990 Unspecified osteoarthritis, unspecified site: Secondary | ICD-10-CM | POA: Diagnosis not present

## 2023-11-22 DIAGNOSIS — D649 Anemia, unspecified: Secondary | ICD-10-CM | POA: Diagnosis not present

## 2023-11-22 DIAGNOSIS — Z9181 History of falling: Secondary | ICD-10-CM | POA: Diagnosis not present

## 2023-11-22 DIAGNOSIS — E039 Hypothyroidism, unspecified: Secondary | ICD-10-CM | POA: Diagnosis not present

## 2023-11-22 DIAGNOSIS — H3530 Unspecified macular degeneration: Secondary | ICD-10-CM | POA: Diagnosis not present

## 2023-11-22 DIAGNOSIS — Z974 Presence of external hearing-aid: Secondary | ICD-10-CM | POA: Diagnosis not present

## 2023-11-22 DIAGNOSIS — Z955 Presence of coronary angioplasty implant and graft: Secondary | ICD-10-CM | POA: Diagnosis not present

## 2023-11-22 DIAGNOSIS — Z794 Long term (current) use of insulin: Secondary | ICD-10-CM | POA: Diagnosis not present

## 2023-11-22 DIAGNOSIS — E1165 Type 2 diabetes mellitus with hyperglycemia: Secondary | ICD-10-CM | POA: Diagnosis not present

## 2023-11-22 DIAGNOSIS — Z87440 Personal history of urinary (tract) infections: Secondary | ICD-10-CM | POA: Diagnosis not present

## 2023-11-22 DIAGNOSIS — H9193 Unspecified hearing loss, bilateral: Secondary | ICD-10-CM | POA: Diagnosis not present

## 2023-11-22 DIAGNOSIS — I951 Orthostatic hypotension: Secondary | ICD-10-CM | POA: Diagnosis not present

## 2023-11-22 DIAGNOSIS — Z556 Problems related to health literacy: Secondary | ICD-10-CM | POA: Diagnosis not present

## 2023-11-22 DIAGNOSIS — I152 Hypertension secondary to endocrine disorders: Secondary | ICD-10-CM | POA: Diagnosis not present

## 2023-11-22 DIAGNOSIS — Z7901 Long term (current) use of anticoagulants: Secondary | ICD-10-CM | POA: Diagnosis not present

## 2023-11-22 DIAGNOSIS — Z86718 Personal history of other venous thrombosis and embolism: Secondary | ICD-10-CM | POA: Diagnosis not present

## 2023-11-22 DIAGNOSIS — E1159 Type 2 diabetes mellitus with other circulatory complications: Secondary | ICD-10-CM | POA: Diagnosis not present

## 2023-11-22 DIAGNOSIS — N400 Enlarged prostate without lower urinary tract symptoms: Secondary | ICD-10-CM | POA: Diagnosis not present

## 2023-11-22 DIAGNOSIS — Z950 Presence of cardiac pacemaker: Secondary | ICD-10-CM | POA: Diagnosis not present

## 2023-11-22 DIAGNOSIS — I495 Sick sinus syndrome: Secondary | ICD-10-CM | POA: Diagnosis not present

## 2023-11-22 DIAGNOSIS — Z8673 Personal history of transient ischemic attack (TIA), and cerebral infarction without residual deficits: Secondary | ICD-10-CM | POA: Diagnosis not present

## 2023-11-28 DIAGNOSIS — E039 Hypothyroidism, unspecified: Secondary | ICD-10-CM | POA: Diagnosis not present

## 2023-11-28 DIAGNOSIS — Z8673 Personal history of transient ischemic attack (TIA), and cerebral infarction without residual deficits: Secondary | ICD-10-CM | POA: Diagnosis not present

## 2023-11-28 DIAGNOSIS — E1159 Type 2 diabetes mellitus with other circulatory complications: Secondary | ICD-10-CM | POA: Diagnosis not present

## 2023-11-28 DIAGNOSIS — D649 Anemia, unspecified: Secondary | ICD-10-CM | POA: Diagnosis not present

## 2023-11-28 DIAGNOSIS — Z950 Presence of cardiac pacemaker: Secondary | ICD-10-CM | POA: Diagnosis not present

## 2023-11-28 DIAGNOSIS — H3530 Unspecified macular degeneration: Secondary | ICD-10-CM | POA: Diagnosis not present

## 2023-11-28 DIAGNOSIS — Z556 Problems related to health literacy: Secondary | ICD-10-CM | POA: Diagnosis not present

## 2023-11-28 DIAGNOSIS — N400 Enlarged prostate without lower urinary tract symptoms: Secondary | ICD-10-CM | POA: Diagnosis not present

## 2023-11-28 DIAGNOSIS — Z794 Long term (current) use of insulin: Secondary | ICD-10-CM | POA: Diagnosis not present

## 2023-11-28 DIAGNOSIS — E1165 Type 2 diabetes mellitus with hyperglycemia: Secondary | ICD-10-CM | POA: Diagnosis not present

## 2023-11-28 DIAGNOSIS — I951 Orthostatic hypotension: Secondary | ICD-10-CM | POA: Diagnosis not present

## 2023-11-28 DIAGNOSIS — M1990 Unspecified osteoarthritis, unspecified site: Secondary | ICD-10-CM | POA: Diagnosis not present

## 2023-11-28 DIAGNOSIS — Z955 Presence of coronary angioplasty implant and graft: Secondary | ICD-10-CM | POA: Diagnosis not present

## 2023-11-28 DIAGNOSIS — Z974 Presence of external hearing-aid: Secondary | ICD-10-CM | POA: Diagnosis not present

## 2023-11-28 DIAGNOSIS — I2510 Atherosclerotic heart disease of native coronary artery without angina pectoris: Secondary | ICD-10-CM | POA: Diagnosis not present

## 2023-11-28 DIAGNOSIS — H9193 Unspecified hearing loss, bilateral: Secondary | ICD-10-CM | POA: Diagnosis not present

## 2023-11-28 DIAGNOSIS — Z87440 Personal history of urinary (tract) infections: Secondary | ICD-10-CM | POA: Diagnosis not present

## 2023-11-28 DIAGNOSIS — Z9181 History of falling: Secondary | ICD-10-CM | POA: Diagnosis not present

## 2023-11-28 DIAGNOSIS — Z86718 Personal history of other venous thrombosis and embolism: Secondary | ICD-10-CM | POA: Diagnosis not present

## 2023-11-28 DIAGNOSIS — Z7901 Long term (current) use of anticoagulants: Secondary | ICD-10-CM | POA: Diagnosis not present

## 2023-11-28 DIAGNOSIS — I152 Hypertension secondary to endocrine disorders: Secondary | ICD-10-CM | POA: Diagnosis not present

## 2023-11-28 DIAGNOSIS — I495 Sick sinus syndrome: Secondary | ICD-10-CM | POA: Diagnosis not present

## 2023-11-29 DIAGNOSIS — M1990 Unspecified osteoarthritis, unspecified site: Secondary | ICD-10-CM | POA: Diagnosis not present

## 2023-11-29 DIAGNOSIS — I152 Hypertension secondary to endocrine disorders: Secondary | ICD-10-CM | POA: Diagnosis not present

## 2023-11-29 DIAGNOSIS — Z8673 Personal history of transient ischemic attack (TIA), and cerebral infarction without residual deficits: Secondary | ICD-10-CM | POA: Diagnosis not present

## 2023-11-29 DIAGNOSIS — D649 Anemia, unspecified: Secondary | ICD-10-CM | POA: Diagnosis not present

## 2023-11-29 DIAGNOSIS — N400 Enlarged prostate without lower urinary tract symptoms: Secondary | ICD-10-CM | POA: Diagnosis not present

## 2023-11-29 DIAGNOSIS — Z86718 Personal history of other venous thrombosis and embolism: Secondary | ICD-10-CM | POA: Diagnosis not present

## 2023-11-29 DIAGNOSIS — Z87440 Personal history of urinary (tract) infections: Secondary | ICD-10-CM | POA: Diagnosis not present

## 2023-11-29 DIAGNOSIS — Z556 Problems related to health literacy: Secondary | ICD-10-CM | POA: Diagnosis not present

## 2023-11-29 DIAGNOSIS — Z955 Presence of coronary angioplasty implant and graft: Secondary | ICD-10-CM | POA: Diagnosis not present

## 2023-11-29 DIAGNOSIS — E1159 Type 2 diabetes mellitus with other circulatory complications: Secondary | ICD-10-CM | POA: Diagnosis not present

## 2023-11-29 DIAGNOSIS — Z9181 History of falling: Secondary | ICD-10-CM | POA: Diagnosis not present

## 2023-11-29 DIAGNOSIS — E1165 Type 2 diabetes mellitus with hyperglycemia: Secondary | ICD-10-CM | POA: Diagnosis not present

## 2023-11-29 DIAGNOSIS — H9193 Unspecified hearing loss, bilateral: Secondary | ICD-10-CM | POA: Diagnosis not present

## 2023-11-29 DIAGNOSIS — Z950 Presence of cardiac pacemaker: Secondary | ICD-10-CM | POA: Diagnosis not present

## 2023-11-29 DIAGNOSIS — E039 Hypothyroidism, unspecified: Secondary | ICD-10-CM | POA: Diagnosis not present

## 2023-11-29 DIAGNOSIS — H3530 Unspecified macular degeneration: Secondary | ICD-10-CM | POA: Diagnosis not present

## 2023-11-29 DIAGNOSIS — I495 Sick sinus syndrome: Secondary | ICD-10-CM | POA: Diagnosis not present

## 2023-11-29 DIAGNOSIS — I2510 Atherosclerotic heart disease of native coronary artery without angina pectoris: Secondary | ICD-10-CM | POA: Diagnosis not present

## 2023-11-29 DIAGNOSIS — Z7901 Long term (current) use of anticoagulants: Secondary | ICD-10-CM | POA: Diagnosis not present

## 2023-11-29 DIAGNOSIS — Z794 Long term (current) use of insulin: Secondary | ICD-10-CM | POA: Diagnosis not present

## 2023-11-29 DIAGNOSIS — I951 Orthostatic hypotension: Secondary | ICD-10-CM | POA: Diagnosis not present

## 2023-11-29 DIAGNOSIS — Z974 Presence of external hearing-aid: Secondary | ICD-10-CM | POA: Diagnosis not present

## 2023-12-02 DIAGNOSIS — I152 Hypertension secondary to endocrine disorders: Secondary | ICD-10-CM | POA: Diagnosis not present

## 2023-12-02 DIAGNOSIS — Z974 Presence of external hearing-aid: Secondary | ICD-10-CM | POA: Diagnosis not present

## 2023-12-02 DIAGNOSIS — Z794 Long term (current) use of insulin: Secondary | ICD-10-CM | POA: Diagnosis not present

## 2023-12-02 DIAGNOSIS — Z8673 Personal history of transient ischemic attack (TIA), and cerebral infarction without residual deficits: Secondary | ICD-10-CM | POA: Diagnosis not present

## 2023-12-02 DIAGNOSIS — H9193 Unspecified hearing loss, bilateral: Secondary | ICD-10-CM | POA: Diagnosis not present

## 2023-12-02 DIAGNOSIS — I495 Sick sinus syndrome: Secondary | ICD-10-CM | POA: Diagnosis not present

## 2023-12-02 DIAGNOSIS — Z87440 Personal history of urinary (tract) infections: Secondary | ICD-10-CM | POA: Diagnosis not present

## 2023-12-02 DIAGNOSIS — D649 Anemia, unspecified: Secondary | ICD-10-CM | POA: Diagnosis not present

## 2023-12-02 DIAGNOSIS — Z86718 Personal history of other venous thrombosis and embolism: Secondary | ICD-10-CM | POA: Diagnosis not present

## 2023-12-02 DIAGNOSIS — Z7901 Long term (current) use of anticoagulants: Secondary | ICD-10-CM | POA: Diagnosis not present

## 2023-12-02 DIAGNOSIS — E039 Hypothyroidism, unspecified: Secondary | ICD-10-CM | POA: Diagnosis not present

## 2023-12-02 DIAGNOSIS — Z556 Problems related to health literacy: Secondary | ICD-10-CM | POA: Diagnosis not present

## 2023-12-02 DIAGNOSIS — Z950 Presence of cardiac pacemaker: Secondary | ICD-10-CM | POA: Diagnosis not present

## 2023-12-02 DIAGNOSIS — I2510 Atherosclerotic heart disease of native coronary artery without angina pectoris: Secondary | ICD-10-CM | POA: Diagnosis not present

## 2023-12-02 DIAGNOSIS — H3530 Unspecified macular degeneration: Secondary | ICD-10-CM | POA: Diagnosis not present

## 2023-12-02 DIAGNOSIS — E1165 Type 2 diabetes mellitus with hyperglycemia: Secondary | ICD-10-CM | POA: Diagnosis not present

## 2023-12-02 DIAGNOSIS — Z9181 History of falling: Secondary | ICD-10-CM | POA: Diagnosis not present

## 2023-12-02 DIAGNOSIS — M1990 Unspecified osteoarthritis, unspecified site: Secondary | ICD-10-CM | POA: Diagnosis not present

## 2023-12-02 DIAGNOSIS — N400 Enlarged prostate without lower urinary tract symptoms: Secondary | ICD-10-CM | POA: Diagnosis not present

## 2023-12-02 DIAGNOSIS — I951 Orthostatic hypotension: Secondary | ICD-10-CM | POA: Diagnosis not present

## 2023-12-02 DIAGNOSIS — Z955 Presence of coronary angioplasty implant and graft: Secondary | ICD-10-CM | POA: Diagnosis not present

## 2023-12-02 DIAGNOSIS — E1159 Type 2 diabetes mellitus with other circulatory complications: Secondary | ICD-10-CM | POA: Diagnosis not present

## 2023-12-05 DIAGNOSIS — E039 Hypothyroidism, unspecified: Secondary | ICD-10-CM | POA: Diagnosis not present

## 2023-12-05 DIAGNOSIS — I2510 Atherosclerotic heart disease of native coronary artery without angina pectoris: Secondary | ICD-10-CM | POA: Diagnosis not present

## 2023-12-05 DIAGNOSIS — Z9181 History of falling: Secondary | ICD-10-CM | POA: Diagnosis not present

## 2023-12-05 DIAGNOSIS — H9193 Unspecified hearing loss, bilateral: Secondary | ICD-10-CM | POA: Diagnosis not present

## 2023-12-05 DIAGNOSIS — E1159 Type 2 diabetes mellitus with other circulatory complications: Secondary | ICD-10-CM | POA: Diagnosis not present

## 2023-12-05 DIAGNOSIS — H3530 Unspecified macular degeneration: Secondary | ICD-10-CM | POA: Diagnosis not present

## 2023-12-05 DIAGNOSIS — Z86718 Personal history of other venous thrombosis and embolism: Secondary | ICD-10-CM | POA: Diagnosis not present

## 2023-12-05 DIAGNOSIS — Z950 Presence of cardiac pacemaker: Secondary | ICD-10-CM | POA: Diagnosis not present

## 2023-12-05 DIAGNOSIS — E1165 Type 2 diabetes mellitus with hyperglycemia: Secondary | ICD-10-CM | POA: Diagnosis not present

## 2023-12-05 DIAGNOSIS — D649 Anemia, unspecified: Secondary | ICD-10-CM | POA: Diagnosis not present

## 2023-12-05 DIAGNOSIS — I495 Sick sinus syndrome: Secondary | ICD-10-CM | POA: Diagnosis not present

## 2023-12-05 DIAGNOSIS — Z955 Presence of coronary angioplasty implant and graft: Secondary | ICD-10-CM | POA: Diagnosis not present

## 2023-12-05 DIAGNOSIS — Z556 Problems related to health literacy: Secondary | ICD-10-CM | POA: Diagnosis not present

## 2023-12-05 DIAGNOSIS — Z794 Long term (current) use of insulin: Secondary | ICD-10-CM | POA: Diagnosis not present

## 2023-12-05 DIAGNOSIS — Z7901 Long term (current) use of anticoagulants: Secondary | ICD-10-CM | POA: Diagnosis not present

## 2023-12-05 DIAGNOSIS — N400 Enlarged prostate without lower urinary tract symptoms: Secondary | ICD-10-CM | POA: Diagnosis not present

## 2023-12-05 DIAGNOSIS — Z87440 Personal history of urinary (tract) infections: Secondary | ICD-10-CM | POA: Diagnosis not present

## 2023-12-05 DIAGNOSIS — I951 Orthostatic hypotension: Secondary | ICD-10-CM | POA: Diagnosis not present

## 2023-12-05 DIAGNOSIS — M1990 Unspecified osteoarthritis, unspecified site: Secondary | ICD-10-CM | POA: Diagnosis not present

## 2023-12-05 DIAGNOSIS — Z8673 Personal history of transient ischemic attack (TIA), and cerebral infarction without residual deficits: Secondary | ICD-10-CM | POA: Diagnosis not present

## 2023-12-05 DIAGNOSIS — Z974 Presence of external hearing-aid: Secondary | ICD-10-CM | POA: Diagnosis not present

## 2023-12-05 DIAGNOSIS — I152 Hypertension secondary to endocrine disorders: Secondary | ICD-10-CM | POA: Diagnosis not present

## 2023-12-06 DIAGNOSIS — H9193 Unspecified hearing loss, bilateral: Secondary | ICD-10-CM | POA: Diagnosis not present

## 2023-12-06 DIAGNOSIS — Z9181 History of falling: Secondary | ICD-10-CM | POA: Diagnosis not present

## 2023-12-06 DIAGNOSIS — Z950 Presence of cardiac pacemaker: Secondary | ICD-10-CM | POA: Diagnosis not present

## 2023-12-06 DIAGNOSIS — D649 Anemia, unspecified: Secondary | ICD-10-CM | POA: Diagnosis not present

## 2023-12-06 DIAGNOSIS — Z87440 Personal history of urinary (tract) infections: Secondary | ICD-10-CM | POA: Diagnosis not present

## 2023-12-06 DIAGNOSIS — Z794 Long term (current) use of insulin: Secondary | ICD-10-CM | POA: Diagnosis not present

## 2023-12-06 DIAGNOSIS — E1159 Type 2 diabetes mellitus with other circulatory complications: Secondary | ICD-10-CM | POA: Diagnosis not present

## 2023-12-06 DIAGNOSIS — E1165 Type 2 diabetes mellitus with hyperglycemia: Secondary | ICD-10-CM | POA: Diagnosis not present

## 2023-12-06 DIAGNOSIS — I495 Sick sinus syndrome: Secondary | ICD-10-CM | POA: Diagnosis not present

## 2023-12-06 DIAGNOSIS — Z974 Presence of external hearing-aid: Secondary | ICD-10-CM | POA: Diagnosis not present

## 2023-12-06 DIAGNOSIS — Z955 Presence of coronary angioplasty implant and graft: Secondary | ICD-10-CM | POA: Diagnosis not present

## 2023-12-06 DIAGNOSIS — I951 Orthostatic hypotension: Secondary | ICD-10-CM | POA: Diagnosis not present

## 2023-12-06 DIAGNOSIS — Z556 Problems related to health literacy: Secondary | ICD-10-CM | POA: Diagnosis not present

## 2023-12-06 DIAGNOSIS — I2510 Atherosclerotic heart disease of native coronary artery without angina pectoris: Secondary | ICD-10-CM | POA: Diagnosis not present

## 2023-12-06 DIAGNOSIS — M1990 Unspecified osteoarthritis, unspecified site: Secondary | ICD-10-CM | POA: Diagnosis not present

## 2023-12-06 DIAGNOSIS — N400 Enlarged prostate without lower urinary tract symptoms: Secondary | ICD-10-CM | POA: Diagnosis not present

## 2023-12-06 DIAGNOSIS — Z86718 Personal history of other venous thrombosis and embolism: Secondary | ICD-10-CM | POA: Diagnosis not present

## 2023-12-06 DIAGNOSIS — Z8673 Personal history of transient ischemic attack (TIA), and cerebral infarction without residual deficits: Secondary | ICD-10-CM | POA: Diagnosis not present

## 2023-12-06 DIAGNOSIS — I152 Hypertension secondary to endocrine disorders: Secondary | ICD-10-CM | POA: Diagnosis not present

## 2023-12-06 DIAGNOSIS — E039 Hypothyroidism, unspecified: Secondary | ICD-10-CM | POA: Diagnosis not present

## 2023-12-06 DIAGNOSIS — Z7901 Long term (current) use of anticoagulants: Secondary | ICD-10-CM | POA: Diagnosis not present

## 2023-12-06 DIAGNOSIS — H3530 Unspecified macular degeneration: Secondary | ICD-10-CM | POA: Diagnosis not present

## 2023-12-07 DIAGNOSIS — H3530 Unspecified macular degeneration: Secondary | ICD-10-CM | POA: Diagnosis not present

## 2023-12-07 DIAGNOSIS — D649 Anemia, unspecified: Secondary | ICD-10-CM | POA: Diagnosis not present

## 2023-12-07 DIAGNOSIS — I951 Orthostatic hypotension: Secondary | ICD-10-CM | POA: Diagnosis not present

## 2023-12-07 DIAGNOSIS — E039 Hypothyroidism, unspecified: Secondary | ICD-10-CM | POA: Diagnosis not present

## 2023-12-07 DIAGNOSIS — M1990 Unspecified osteoarthritis, unspecified site: Secondary | ICD-10-CM | POA: Diagnosis not present

## 2023-12-07 DIAGNOSIS — Z9181 History of falling: Secondary | ICD-10-CM | POA: Diagnosis not present

## 2023-12-07 DIAGNOSIS — I2510 Atherosclerotic heart disease of native coronary artery without angina pectoris: Secondary | ICD-10-CM | POA: Diagnosis not present

## 2023-12-07 DIAGNOSIS — Z8673 Personal history of transient ischemic attack (TIA), and cerebral infarction without residual deficits: Secondary | ICD-10-CM | POA: Diagnosis not present

## 2023-12-07 DIAGNOSIS — Z794 Long term (current) use of insulin: Secondary | ICD-10-CM | POA: Diagnosis not present

## 2023-12-07 DIAGNOSIS — Z950 Presence of cardiac pacemaker: Secondary | ICD-10-CM | POA: Diagnosis not present

## 2023-12-07 DIAGNOSIS — H9193 Unspecified hearing loss, bilateral: Secondary | ICD-10-CM | POA: Diagnosis not present

## 2023-12-07 DIAGNOSIS — Z955 Presence of coronary angioplasty implant and graft: Secondary | ICD-10-CM | POA: Diagnosis not present

## 2023-12-07 DIAGNOSIS — I152 Hypertension secondary to endocrine disorders: Secondary | ICD-10-CM | POA: Diagnosis not present

## 2023-12-07 DIAGNOSIS — N400 Enlarged prostate without lower urinary tract symptoms: Secondary | ICD-10-CM | POA: Diagnosis not present

## 2023-12-07 DIAGNOSIS — Z7901 Long term (current) use of anticoagulants: Secondary | ICD-10-CM | POA: Diagnosis not present

## 2023-12-07 DIAGNOSIS — E1159 Type 2 diabetes mellitus with other circulatory complications: Secondary | ICD-10-CM | POA: Diagnosis not present

## 2023-12-07 DIAGNOSIS — E1165 Type 2 diabetes mellitus with hyperglycemia: Secondary | ICD-10-CM | POA: Diagnosis not present

## 2023-12-07 DIAGNOSIS — Z974 Presence of external hearing-aid: Secondary | ICD-10-CM | POA: Diagnosis not present

## 2023-12-07 DIAGNOSIS — Z87440 Personal history of urinary (tract) infections: Secondary | ICD-10-CM | POA: Diagnosis not present

## 2023-12-07 DIAGNOSIS — Z556 Problems related to health literacy: Secondary | ICD-10-CM | POA: Diagnosis not present

## 2023-12-07 DIAGNOSIS — Z86718 Personal history of other venous thrombosis and embolism: Secondary | ICD-10-CM | POA: Diagnosis not present

## 2023-12-07 DIAGNOSIS — I495 Sick sinus syndrome: Secondary | ICD-10-CM | POA: Diagnosis not present

## 2023-12-07 NOTE — Progress Notes (Signed)
 Remote pacemaker transmission.

## 2023-12-12 DIAGNOSIS — Z86718 Personal history of other venous thrombosis and embolism: Secondary | ICD-10-CM | POA: Diagnosis not present

## 2023-12-12 DIAGNOSIS — M1990 Unspecified osteoarthritis, unspecified site: Secondary | ICD-10-CM | POA: Diagnosis not present

## 2023-12-12 DIAGNOSIS — Z9181 History of falling: Secondary | ICD-10-CM | POA: Diagnosis not present

## 2023-12-12 DIAGNOSIS — I951 Orthostatic hypotension: Secondary | ICD-10-CM | POA: Diagnosis not present

## 2023-12-12 DIAGNOSIS — H3530 Unspecified macular degeneration: Secondary | ICD-10-CM | POA: Diagnosis not present

## 2023-12-12 DIAGNOSIS — Z950 Presence of cardiac pacemaker: Secondary | ICD-10-CM | POA: Diagnosis not present

## 2023-12-12 DIAGNOSIS — E039 Hypothyroidism, unspecified: Secondary | ICD-10-CM | POA: Diagnosis not present

## 2023-12-12 DIAGNOSIS — Z794 Long term (current) use of insulin: Secondary | ICD-10-CM | POA: Diagnosis not present

## 2023-12-12 DIAGNOSIS — N400 Enlarged prostate without lower urinary tract symptoms: Secondary | ICD-10-CM | POA: Diagnosis not present

## 2023-12-12 DIAGNOSIS — H9193 Unspecified hearing loss, bilateral: Secondary | ICD-10-CM | POA: Diagnosis not present

## 2023-12-12 DIAGNOSIS — Z7901 Long term (current) use of anticoagulants: Secondary | ICD-10-CM | POA: Diagnosis not present

## 2023-12-12 DIAGNOSIS — Z8673 Personal history of transient ischemic attack (TIA), and cerebral infarction without residual deficits: Secondary | ICD-10-CM | POA: Diagnosis not present

## 2023-12-12 DIAGNOSIS — Z974 Presence of external hearing-aid: Secondary | ICD-10-CM | POA: Diagnosis not present

## 2023-12-12 DIAGNOSIS — E1165 Type 2 diabetes mellitus with hyperglycemia: Secondary | ICD-10-CM | POA: Diagnosis not present

## 2023-12-12 DIAGNOSIS — D649 Anemia, unspecified: Secondary | ICD-10-CM | POA: Diagnosis not present

## 2023-12-12 DIAGNOSIS — Z556 Problems related to health literacy: Secondary | ICD-10-CM | POA: Diagnosis not present

## 2023-12-12 DIAGNOSIS — I2510 Atherosclerotic heart disease of native coronary artery without angina pectoris: Secondary | ICD-10-CM | POA: Diagnosis not present

## 2023-12-12 DIAGNOSIS — Z87440 Personal history of urinary (tract) infections: Secondary | ICD-10-CM | POA: Diagnosis not present

## 2023-12-12 DIAGNOSIS — I495 Sick sinus syndrome: Secondary | ICD-10-CM | POA: Diagnosis not present

## 2023-12-12 DIAGNOSIS — I152 Hypertension secondary to endocrine disorders: Secondary | ICD-10-CM | POA: Diagnosis not present

## 2023-12-12 DIAGNOSIS — Z955 Presence of coronary angioplasty implant and graft: Secondary | ICD-10-CM | POA: Diagnosis not present

## 2023-12-12 DIAGNOSIS — E1159 Type 2 diabetes mellitus with other circulatory complications: Secondary | ICD-10-CM | POA: Diagnosis not present

## 2023-12-13 DIAGNOSIS — N400 Enlarged prostate without lower urinary tract symptoms: Secondary | ICD-10-CM | POA: Diagnosis not present

## 2023-12-13 DIAGNOSIS — I152 Hypertension secondary to endocrine disorders: Secondary | ICD-10-CM | POA: Diagnosis not present

## 2023-12-13 DIAGNOSIS — Z9181 History of falling: Secondary | ICD-10-CM | POA: Diagnosis not present

## 2023-12-13 DIAGNOSIS — I2510 Atherosclerotic heart disease of native coronary artery without angina pectoris: Secondary | ICD-10-CM | POA: Diagnosis not present

## 2023-12-13 DIAGNOSIS — Z974 Presence of external hearing-aid: Secondary | ICD-10-CM | POA: Diagnosis not present

## 2023-12-13 DIAGNOSIS — Z556 Problems related to health literacy: Secondary | ICD-10-CM | POA: Diagnosis not present

## 2023-12-13 DIAGNOSIS — M1990 Unspecified osteoarthritis, unspecified site: Secondary | ICD-10-CM | POA: Diagnosis not present

## 2023-12-13 DIAGNOSIS — E1159 Type 2 diabetes mellitus with other circulatory complications: Secondary | ICD-10-CM | POA: Diagnosis not present

## 2023-12-13 DIAGNOSIS — Z794 Long term (current) use of insulin: Secondary | ICD-10-CM | POA: Diagnosis not present

## 2023-12-13 DIAGNOSIS — H3530 Unspecified macular degeneration: Secondary | ICD-10-CM | POA: Diagnosis not present

## 2023-12-13 DIAGNOSIS — I495 Sick sinus syndrome: Secondary | ICD-10-CM | POA: Diagnosis not present

## 2023-12-13 DIAGNOSIS — D649 Anemia, unspecified: Secondary | ICD-10-CM | POA: Diagnosis not present

## 2023-12-13 DIAGNOSIS — Z87440 Personal history of urinary (tract) infections: Secondary | ICD-10-CM | POA: Diagnosis not present

## 2023-12-13 DIAGNOSIS — Z8673 Personal history of transient ischemic attack (TIA), and cerebral infarction without residual deficits: Secondary | ICD-10-CM | POA: Diagnosis not present

## 2023-12-13 DIAGNOSIS — I951 Orthostatic hypotension: Secondary | ICD-10-CM | POA: Diagnosis not present

## 2023-12-13 DIAGNOSIS — E1165 Type 2 diabetes mellitus with hyperglycemia: Secondary | ICD-10-CM | POA: Diagnosis not present

## 2023-12-13 DIAGNOSIS — Z950 Presence of cardiac pacemaker: Secondary | ICD-10-CM | POA: Diagnosis not present

## 2023-12-13 DIAGNOSIS — E039 Hypothyroidism, unspecified: Secondary | ICD-10-CM | POA: Diagnosis not present

## 2023-12-13 DIAGNOSIS — H9193 Unspecified hearing loss, bilateral: Secondary | ICD-10-CM | POA: Diagnosis not present

## 2023-12-13 DIAGNOSIS — Z86718 Personal history of other venous thrombosis and embolism: Secondary | ICD-10-CM | POA: Diagnosis not present

## 2023-12-13 DIAGNOSIS — Z7901 Long term (current) use of anticoagulants: Secondary | ICD-10-CM | POA: Diagnosis not present

## 2023-12-13 DIAGNOSIS — Z955 Presence of coronary angioplasty implant and graft: Secondary | ICD-10-CM | POA: Diagnosis not present

## 2023-12-14 DIAGNOSIS — I951 Orthostatic hypotension: Secondary | ICD-10-CM | POA: Diagnosis not present

## 2023-12-14 DIAGNOSIS — E039 Hypothyroidism, unspecified: Secondary | ICD-10-CM | POA: Diagnosis not present

## 2023-12-14 DIAGNOSIS — Z955 Presence of coronary angioplasty implant and graft: Secondary | ICD-10-CM | POA: Diagnosis not present

## 2023-12-14 DIAGNOSIS — I2510 Atherosclerotic heart disease of native coronary artery without angina pectoris: Secondary | ICD-10-CM | POA: Diagnosis not present

## 2023-12-14 DIAGNOSIS — H9193 Unspecified hearing loss, bilateral: Secondary | ICD-10-CM | POA: Diagnosis not present

## 2023-12-14 DIAGNOSIS — M1990 Unspecified osteoarthritis, unspecified site: Secondary | ICD-10-CM | POA: Diagnosis not present

## 2023-12-14 DIAGNOSIS — Z86718 Personal history of other venous thrombosis and embolism: Secondary | ICD-10-CM | POA: Diagnosis not present

## 2023-12-14 DIAGNOSIS — E1159 Type 2 diabetes mellitus with other circulatory complications: Secondary | ICD-10-CM | POA: Diagnosis not present

## 2023-12-14 DIAGNOSIS — Z9181 History of falling: Secondary | ICD-10-CM | POA: Diagnosis not present

## 2023-12-14 DIAGNOSIS — Z87440 Personal history of urinary (tract) infections: Secondary | ICD-10-CM | POA: Diagnosis not present

## 2023-12-14 DIAGNOSIS — Z556 Problems related to health literacy: Secondary | ICD-10-CM | POA: Diagnosis not present

## 2023-12-14 DIAGNOSIS — I495 Sick sinus syndrome: Secondary | ICD-10-CM | POA: Diagnosis not present

## 2023-12-14 DIAGNOSIS — D649 Anemia, unspecified: Secondary | ICD-10-CM | POA: Diagnosis not present

## 2023-12-14 DIAGNOSIS — Z974 Presence of external hearing-aid: Secondary | ICD-10-CM | POA: Diagnosis not present

## 2023-12-14 DIAGNOSIS — Z7901 Long term (current) use of anticoagulants: Secondary | ICD-10-CM | POA: Diagnosis not present

## 2023-12-14 DIAGNOSIS — Z8673 Personal history of transient ischemic attack (TIA), and cerebral infarction without residual deficits: Secondary | ICD-10-CM | POA: Diagnosis not present

## 2023-12-14 DIAGNOSIS — E1165 Type 2 diabetes mellitus with hyperglycemia: Secondary | ICD-10-CM | POA: Diagnosis not present

## 2023-12-14 DIAGNOSIS — Z794 Long term (current) use of insulin: Secondary | ICD-10-CM | POA: Diagnosis not present

## 2023-12-14 DIAGNOSIS — H3530 Unspecified macular degeneration: Secondary | ICD-10-CM | POA: Diagnosis not present

## 2023-12-14 DIAGNOSIS — N400 Enlarged prostate without lower urinary tract symptoms: Secondary | ICD-10-CM | POA: Diagnosis not present

## 2023-12-14 DIAGNOSIS — I152 Hypertension secondary to endocrine disorders: Secondary | ICD-10-CM | POA: Diagnosis not present

## 2023-12-14 DIAGNOSIS — Z950 Presence of cardiac pacemaker: Secondary | ICD-10-CM | POA: Diagnosis not present

## 2023-12-15 DIAGNOSIS — Z794 Long term (current) use of insulin: Secondary | ICD-10-CM | POA: Diagnosis not present

## 2023-12-15 DIAGNOSIS — M1990 Unspecified osteoarthritis, unspecified site: Secondary | ICD-10-CM | POA: Diagnosis not present

## 2023-12-15 DIAGNOSIS — H3530 Unspecified macular degeneration: Secondary | ICD-10-CM | POA: Diagnosis not present

## 2023-12-15 DIAGNOSIS — E1165 Type 2 diabetes mellitus with hyperglycemia: Secondary | ICD-10-CM | POA: Diagnosis not present

## 2023-12-15 DIAGNOSIS — Z955 Presence of coronary angioplasty implant and graft: Secondary | ICD-10-CM | POA: Diagnosis not present

## 2023-12-15 DIAGNOSIS — E1159 Type 2 diabetes mellitus with other circulatory complications: Secondary | ICD-10-CM | POA: Diagnosis not present

## 2023-12-15 DIAGNOSIS — E039 Hypothyroidism, unspecified: Secondary | ICD-10-CM | POA: Diagnosis not present

## 2023-12-15 DIAGNOSIS — Z8673 Personal history of transient ischemic attack (TIA), and cerebral infarction without residual deficits: Secondary | ICD-10-CM | POA: Diagnosis not present

## 2023-12-15 DIAGNOSIS — I495 Sick sinus syndrome: Secondary | ICD-10-CM | POA: Diagnosis not present

## 2023-12-15 DIAGNOSIS — Z87440 Personal history of urinary (tract) infections: Secondary | ICD-10-CM | POA: Diagnosis not present

## 2023-12-15 DIAGNOSIS — Z86718 Personal history of other venous thrombosis and embolism: Secondary | ICD-10-CM | POA: Diagnosis not present

## 2023-12-15 DIAGNOSIS — H9193 Unspecified hearing loss, bilateral: Secondary | ICD-10-CM | POA: Diagnosis not present

## 2023-12-15 DIAGNOSIS — I152 Hypertension secondary to endocrine disorders: Secondary | ICD-10-CM | POA: Diagnosis not present

## 2023-12-15 DIAGNOSIS — Z556 Problems related to health literacy: Secondary | ICD-10-CM | POA: Diagnosis not present

## 2023-12-15 DIAGNOSIS — I2510 Atherosclerotic heart disease of native coronary artery without angina pectoris: Secondary | ICD-10-CM | POA: Diagnosis not present

## 2023-12-15 DIAGNOSIS — Z974 Presence of external hearing-aid: Secondary | ICD-10-CM | POA: Diagnosis not present

## 2023-12-15 DIAGNOSIS — Z7901 Long term (current) use of anticoagulants: Secondary | ICD-10-CM | POA: Diagnosis not present

## 2023-12-15 DIAGNOSIS — Z9181 History of falling: Secondary | ICD-10-CM | POA: Diagnosis not present

## 2023-12-15 DIAGNOSIS — Z950 Presence of cardiac pacemaker: Secondary | ICD-10-CM | POA: Diagnosis not present

## 2023-12-15 DIAGNOSIS — I951 Orthostatic hypotension: Secondary | ICD-10-CM | POA: Diagnosis not present

## 2023-12-15 DIAGNOSIS — D649 Anemia, unspecified: Secondary | ICD-10-CM | POA: Diagnosis not present

## 2023-12-15 DIAGNOSIS — N400 Enlarged prostate without lower urinary tract symptoms: Secondary | ICD-10-CM | POA: Diagnosis not present

## 2023-12-16 DIAGNOSIS — Z7901 Long term (current) use of anticoagulants: Secondary | ICD-10-CM | POA: Diagnosis not present

## 2023-12-16 DIAGNOSIS — M1990 Unspecified osteoarthritis, unspecified site: Secondary | ICD-10-CM | POA: Diagnosis not present

## 2023-12-16 DIAGNOSIS — Z8673 Personal history of transient ischemic attack (TIA), and cerebral infarction without residual deficits: Secondary | ICD-10-CM | POA: Diagnosis not present

## 2023-12-16 DIAGNOSIS — Z9181 History of falling: Secondary | ICD-10-CM | POA: Diagnosis not present

## 2023-12-16 DIAGNOSIS — I951 Orthostatic hypotension: Secondary | ICD-10-CM | POA: Diagnosis not present

## 2023-12-16 DIAGNOSIS — N400 Enlarged prostate without lower urinary tract symptoms: Secondary | ICD-10-CM | POA: Diagnosis not present

## 2023-12-16 DIAGNOSIS — Z556 Problems related to health literacy: Secondary | ICD-10-CM | POA: Diagnosis not present

## 2023-12-16 DIAGNOSIS — Z950 Presence of cardiac pacemaker: Secondary | ICD-10-CM | POA: Diagnosis not present

## 2023-12-16 DIAGNOSIS — E039 Hypothyroidism, unspecified: Secondary | ICD-10-CM | POA: Diagnosis not present

## 2023-12-16 DIAGNOSIS — E1159 Type 2 diabetes mellitus with other circulatory complications: Secondary | ICD-10-CM | POA: Diagnosis not present

## 2023-12-16 DIAGNOSIS — Z87440 Personal history of urinary (tract) infections: Secondary | ICD-10-CM | POA: Diagnosis not present

## 2023-12-16 DIAGNOSIS — E1165 Type 2 diabetes mellitus with hyperglycemia: Secondary | ICD-10-CM | POA: Diagnosis not present

## 2023-12-16 DIAGNOSIS — H9193 Unspecified hearing loss, bilateral: Secondary | ICD-10-CM | POA: Diagnosis not present

## 2023-12-16 DIAGNOSIS — Z794 Long term (current) use of insulin: Secondary | ICD-10-CM | POA: Diagnosis not present

## 2023-12-16 DIAGNOSIS — I495 Sick sinus syndrome: Secondary | ICD-10-CM | POA: Diagnosis not present

## 2023-12-16 DIAGNOSIS — I2510 Atherosclerotic heart disease of native coronary artery without angina pectoris: Secondary | ICD-10-CM | POA: Diagnosis not present

## 2023-12-16 DIAGNOSIS — Z955 Presence of coronary angioplasty implant and graft: Secondary | ICD-10-CM | POA: Diagnosis not present

## 2023-12-16 DIAGNOSIS — I152 Hypertension secondary to endocrine disorders: Secondary | ICD-10-CM | POA: Diagnosis not present

## 2023-12-16 DIAGNOSIS — D649 Anemia, unspecified: Secondary | ICD-10-CM | POA: Diagnosis not present

## 2023-12-16 DIAGNOSIS — H3530 Unspecified macular degeneration: Secondary | ICD-10-CM | POA: Diagnosis not present

## 2023-12-16 DIAGNOSIS — Z86718 Personal history of other venous thrombosis and embolism: Secondary | ICD-10-CM | POA: Diagnosis not present

## 2023-12-16 DIAGNOSIS — Z974 Presence of external hearing-aid: Secondary | ICD-10-CM | POA: Diagnosis not present

## 2023-12-19 DIAGNOSIS — E1159 Type 2 diabetes mellitus with other circulatory complications: Secondary | ICD-10-CM | POA: Diagnosis not present

## 2023-12-19 DIAGNOSIS — H3530 Unspecified macular degeneration: Secondary | ICD-10-CM | POA: Diagnosis not present

## 2023-12-19 DIAGNOSIS — Z86718 Personal history of other venous thrombosis and embolism: Secondary | ICD-10-CM | POA: Diagnosis not present

## 2023-12-19 DIAGNOSIS — Z950 Presence of cardiac pacemaker: Secondary | ICD-10-CM | POA: Diagnosis not present

## 2023-12-19 DIAGNOSIS — Z974 Presence of external hearing-aid: Secondary | ICD-10-CM | POA: Diagnosis not present

## 2023-12-19 DIAGNOSIS — E1165 Type 2 diabetes mellitus with hyperglycemia: Secondary | ICD-10-CM | POA: Diagnosis not present

## 2023-12-19 DIAGNOSIS — I2510 Atherosclerotic heart disease of native coronary artery without angina pectoris: Secondary | ICD-10-CM | POA: Diagnosis not present

## 2023-12-19 DIAGNOSIS — I152 Hypertension secondary to endocrine disorders: Secondary | ICD-10-CM | POA: Diagnosis not present

## 2023-12-19 DIAGNOSIS — Z9181 History of falling: Secondary | ICD-10-CM | POA: Diagnosis not present

## 2023-12-19 DIAGNOSIS — Z8673 Personal history of transient ischemic attack (TIA), and cerebral infarction without residual deficits: Secondary | ICD-10-CM | POA: Diagnosis not present

## 2023-12-19 DIAGNOSIS — H9193 Unspecified hearing loss, bilateral: Secondary | ICD-10-CM | POA: Diagnosis not present

## 2023-12-19 DIAGNOSIS — Z7901 Long term (current) use of anticoagulants: Secondary | ICD-10-CM | POA: Diagnosis not present

## 2023-12-19 DIAGNOSIS — D649 Anemia, unspecified: Secondary | ICD-10-CM | POA: Diagnosis not present

## 2023-12-19 DIAGNOSIS — N400 Enlarged prostate without lower urinary tract symptoms: Secondary | ICD-10-CM | POA: Diagnosis not present

## 2023-12-19 DIAGNOSIS — I951 Orthostatic hypotension: Secondary | ICD-10-CM | POA: Diagnosis not present

## 2023-12-19 DIAGNOSIS — M1990 Unspecified osteoarthritis, unspecified site: Secondary | ICD-10-CM | POA: Diagnosis not present

## 2023-12-19 DIAGNOSIS — E039 Hypothyroidism, unspecified: Secondary | ICD-10-CM | POA: Diagnosis not present

## 2023-12-19 DIAGNOSIS — Z955 Presence of coronary angioplasty implant and graft: Secondary | ICD-10-CM | POA: Diagnosis not present

## 2023-12-19 DIAGNOSIS — I495 Sick sinus syndrome: Secondary | ICD-10-CM | POA: Diagnosis not present

## 2023-12-19 DIAGNOSIS — Z556 Problems related to health literacy: Secondary | ICD-10-CM | POA: Diagnosis not present

## 2023-12-19 DIAGNOSIS — Z794 Long term (current) use of insulin: Secondary | ICD-10-CM | POA: Diagnosis not present

## 2023-12-19 DIAGNOSIS — Z87440 Personal history of urinary (tract) infections: Secondary | ICD-10-CM | POA: Diagnosis not present

## 2023-12-21 DIAGNOSIS — Z794 Long term (current) use of insulin: Secondary | ICD-10-CM | POA: Diagnosis not present

## 2023-12-21 DIAGNOSIS — M1990 Unspecified osteoarthritis, unspecified site: Secondary | ICD-10-CM | POA: Diagnosis not present

## 2023-12-21 DIAGNOSIS — Z8673 Personal history of transient ischemic attack (TIA), and cerebral infarction without residual deficits: Secondary | ICD-10-CM | POA: Diagnosis not present

## 2023-12-21 DIAGNOSIS — I495 Sick sinus syndrome: Secondary | ICD-10-CM | POA: Diagnosis not present

## 2023-12-21 DIAGNOSIS — Z9181 History of falling: Secondary | ICD-10-CM | POA: Diagnosis not present

## 2023-12-21 DIAGNOSIS — Z974 Presence of external hearing-aid: Secondary | ICD-10-CM | POA: Diagnosis not present

## 2023-12-21 DIAGNOSIS — H3530 Unspecified macular degeneration: Secondary | ICD-10-CM | POA: Diagnosis not present

## 2023-12-21 DIAGNOSIS — Z86718 Personal history of other venous thrombosis and embolism: Secondary | ICD-10-CM | POA: Diagnosis not present

## 2023-12-21 DIAGNOSIS — D649 Anemia, unspecified: Secondary | ICD-10-CM | POA: Diagnosis not present

## 2023-12-21 DIAGNOSIS — N400 Enlarged prostate without lower urinary tract symptoms: Secondary | ICD-10-CM | POA: Diagnosis not present

## 2023-12-21 DIAGNOSIS — I152 Hypertension secondary to endocrine disorders: Secondary | ICD-10-CM | POA: Diagnosis not present

## 2023-12-21 DIAGNOSIS — H9193 Unspecified hearing loss, bilateral: Secondary | ICD-10-CM | POA: Diagnosis not present

## 2023-12-21 DIAGNOSIS — Z556 Problems related to health literacy: Secondary | ICD-10-CM | POA: Diagnosis not present

## 2023-12-21 DIAGNOSIS — Z7901 Long term (current) use of anticoagulants: Secondary | ICD-10-CM | POA: Diagnosis not present

## 2023-12-21 DIAGNOSIS — Z87440 Personal history of urinary (tract) infections: Secondary | ICD-10-CM | POA: Diagnosis not present

## 2023-12-21 DIAGNOSIS — I2510 Atherosclerotic heart disease of native coronary artery without angina pectoris: Secondary | ICD-10-CM | POA: Diagnosis not present

## 2023-12-21 DIAGNOSIS — E1165 Type 2 diabetes mellitus with hyperglycemia: Secondary | ICD-10-CM | POA: Diagnosis not present

## 2023-12-21 DIAGNOSIS — Z955 Presence of coronary angioplasty implant and graft: Secondary | ICD-10-CM | POA: Diagnosis not present

## 2023-12-21 DIAGNOSIS — Z950 Presence of cardiac pacemaker: Secondary | ICD-10-CM | POA: Diagnosis not present

## 2023-12-21 DIAGNOSIS — E039 Hypothyroidism, unspecified: Secondary | ICD-10-CM | POA: Diagnosis not present

## 2023-12-21 DIAGNOSIS — I951 Orthostatic hypotension: Secondary | ICD-10-CM | POA: Diagnosis not present

## 2023-12-21 DIAGNOSIS — E1159 Type 2 diabetes mellitus with other circulatory complications: Secondary | ICD-10-CM | POA: Diagnosis not present

## 2023-12-24 ENCOUNTER — Other Ambulatory Visit: Payer: Self-pay | Admitting: Cardiovascular Disease

## 2023-12-24 DIAGNOSIS — I480 Paroxysmal atrial fibrillation: Secondary | ICD-10-CM

## 2023-12-26 DIAGNOSIS — Z556 Problems related to health literacy: Secondary | ICD-10-CM | POA: Diagnosis not present

## 2023-12-26 DIAGNOSIS — M1990 Unspecified osteoarthritis, unspecified site: Secondary | ICD-10-CM | POA: Diagnosis not present

## 2023-12-26 DIAGNOSIS — H9193 Unspecified hearing loss, bilateral: Secondary | ICD-10-CM | POA: Diagnosis not present

## 2023-12-26 DIAGNOSIS — Z87440 Personal history of urinary (tract) infections: Secondary | ICD-10-CM | POA: Diagnosis not present

## 2023-12-26 DIAGNOSIS — E1159 Type 2 diabetes mellitus with other circulatory complications: Secondary | ICD-10-CM | POA: Diagnosis not present

## 2023-12-26 DIAGNOSIS — N400 Enlarged prostate without lower urinary tract symptoms: Secondary | ICD-10-CM | POA: Diagnosis not present

## 2023-12-26 DIAGNOSIS — Z9181 History of falling: Secondary | ICD-10-CM | POA: Diagnosis not present

## 2023-12-26 DIAGNOSIS — I152 Hypertension secondary to endocrine disorders: Secondary | ICD-10-CM | POA: Diagnosis not present

## 2023-12-26 DIAGNOSIS — Z794 Long term (current) use of insulin: Secondary | ICD-10-CM | POA: Diagnosis not present

## 2023-12-26 DIAGNOSIS — Z950 Presence of cardiac pacemaker: Secondary | ICD-10-CM | POA: Diagnosis not present

## 2023-12-26 DIAGNOSIS — Z955 Presence of coronary angioplasty implant and graft: Secondary | ICD-10-CM | POA: Diagnosis not present

## 2023-12-26 DIAGNOSIS — Z7901 Long term (current) use of anticoagulants: Secondary | ICD-10-CM | POA: Diagnosis not present

## 2023-12-26 DIAGNOSIS — I495 Sick sinus syndrome: Secondary | ICD-10-CM | POA: Diagnosis not present

## 2023-12-26 DIAGNOSIS — D649 Anemia, unspecified: Secondary | ICD-10-CM | POA: Diagnosis not present

## 2023-12-26 DIAGNOSIS — H3530 Unspecified macular degeneration: Secondary | ICD-10-CM | POA: Diagnosis not present

## 2023-12-26 DIAGNOSIS — E1165 Type 2 diabetes mellitus with hyperglycemia: Secondary | ICD-10-CM | POA: Diagnosis not present

## 2023-12-26 DIAGNOSIS — I2510 Atherosclerotic heart disease of native coronary artery without angina pectoris: Secondary | ICD-10-CM | POA: Diagnosis not present

## 2023-12-26 DIAGNOSIS — Z8673 Personal history of transient ischemic attack (TIA), and cerebral infarction without residual deficits: Secondary | ICD-10-CM | POA: Diagnosis not present

## 2023-12-26 DIAGNOSIS — E039 Hypothyroidism, unspecified: Secondary | ICD-10-CM | POA: Diagnosis not present

## 2023-12-26 DIAGNOSIS — Z86718 Personal history of other venous thrombosis and embolism: Secondary | ICD-10-CM | POA: Diagnosis not present

## 2023-12-26 DIAGNOSIS — Z974 Presence of external hearing-aid: Secondary | ICD-10-CM | POA: Diagnosis not present

## 2023-12-26 DIAGNOSIS — I951 Orthostatic hypotension: Secondary | ICD-10-CM | POA: Diagnosis not present

## 2023-12-26 NOTE — Telephone Encounter (Signed)
 Prescription refill request for Eliquis  received. Indication: PAF Last office visit: 03/02/23  P Nahser MD Scr: 1.47 on 08/04/23  Epic Age: 89 Weight: 82.3kg  Based on above findings Eliquis  5mg  twice daily is the appropriate dose.  Refill approved.

## 2023-12-27 DIAGNOSIS — Z8673 Personal history of transient ischemic attack (TIA), and cerebral infarction without residual deficits: Secondary | ICD-10-CM | POA: Diagnosis not present

## 2023-12-27 DIAGNOSIS — Z556 Problems related to health literacy: Secondary | ICD-10-CM | POA: Diagnosis not present

## 2023-12-27 DIAGNOSIS — D649 Anemia, unspecified: Secondary | ICD-10-CM | POA: Diagnosis not present

## 2023-12-27 DIAGNOSIS — E039 Hypothyroidism, unspecified: Secondary | ICD-10-CM | POA: Diagnosis not present

## 2023-12-27 DIAGNOSIS — Z955 Presence of coronary angioplasty implant and graft: Secondary | ICD-10-CM | POA: Diagnosis not present

## 2023-12-27 DIAGNOSIS — I951 Orthostatic hypotension: Secondary | ICD-10-CM | POA: Diagnosis not present

## 2023-12-27 DIAGNOSIS — Z950 Presence of cardiac pacemaker: Secondary | ICD-10-CM | POA: Diagnosis not present

## 2023-12-27 DIAGNOSIS — E1165 Type 2 diabetes mellitus with hyperglycemia: Secondary | ICD-10-CM | POA: Diagnosis not present

## 2023-12-27 DIAGNOSIS — M1990 Unspecified osteoarthritis, unspecified site: Secondary | ICD-10-CM | POA: Diagnosis not present

## 2023-12-27 DIAGNOSIS — Z794 Long term (current) use of insulin: Secondary | ICD-10-CM | POA: Diagnosis not present

## 2023-12-27 DIAGNOSIS — Z974 Presence of external hearing-aid: Secondary | ICD-10-CM | POA: Diagnosis not present

## 2023-12-27 DIAGNOSIS — Z86718 Personal history of other venous thrombosis and embolism: Secondary | ICD-10-CM | POA: Diagnosis not present

## 2023-12-27 DIAGNOSIS — H9193 Unspecified hearing loss, bilateral: Secondary | ICD-10-CM | POA: Diagnosis not present

## 2023-12-27 DIAGNOSIS — I2510 Atherosclerotic heart disease of native coronary artery without angina pectoris: Secondary | ICD-10-CM | POA: Diagnosis not present

## 2023-12-27 DIAGNOSIS — Z9181 History of falling: Secondary | ICD-10-CM | POA: Diagnosis not present

## 2023-12-27 DIAGNOSIS — I495 Sick sinus syndrome: Secondary | ICD-10-CM | POA: Diagnosis not present

## 2023-12-27 DIAGNOSIS — H3530 Unspecified macular degeneration: Secondary | ICD-10-CM | POA: Diagnosis not present

## 2023-12-27 DIAGNOSIS — E1159 Type 2 diabetes mellitus with other circulatory complications: Secondary | ICD-10-CM | POA: Diagnosis not present

## 2023-12-27 DIAGNOSIS — Z7901 Long term (current) use of anticoagulants: Secondary | ICD-10-CM | POA: Diagnosis not present

## 2023-12-27 DIAGNOSIS — I152 Hypertension secondary to endocrine disorders: Secondary | ICD-10-CM | POA: Diagnosis not present

## 2023-12-27 DIAGNOSIS — Z87440 Personal history of urinary (tract) infections: Secondary | ICD-10-CM | POA: Diagnosis not present

## 2023-12-27 DIAGNOSIS — N400 Enlarged prostate without lower urinary tract symptoms: Secondary | ICD-10-CM | POA: Diagnosis not present

## 2023-12-28 DIAGNOSIS — I2510 Atherosclerotic heart disease of native coronary artery without angina pectoris: Secondary | ICD-10-CM | POA: Diagnosis not present

## 2023-12-28 DIAGNOSIS — Z974 Presence of external hearing-aid: Secondary | ICD-10-CM | POA: Diagnosis not present

## 2023-12-28 DIAGNOSIS — E1165 Type 2 diabetes mellitus with hyperglycemia: Secondary | ICD-10-CM | POA: Diagnosis not present

## 2023-12-28 DIAGNOSIS — H3530 Unspecified macular degeneration: Secondary | ICD-10-CM | POA: Diagnosis not present

## 2023-12-28 DIAGNOSIS — M1990 Unspecified osteoarthritis, unspecified site: Secondary | ICD-10-CM | POA: Diagnosis not present

## 2023-12-28 DIAGNOSIS — Z556 Problems related to health literacy: Secondary | ICD-10-CM | POA: Diagnosis not present

## 2023-12-28 DIAGNOSIS — Z86718 Personal history of other venous thrombosis and embolism: Secondary | ICD-10-CM | POA: Diagnosis not present

## 2023-12-28 DIAGNOSIS — I951 Orthostatic hypotension: Secondary | ICD-10-CM | POA: Diagnosis not present

## 2023-12-28 DIAGNOSIS — I152 Hypertension secondary to endocrine disorders: Secondary | ICD-10-CM | POA: Diagnosis not present

## 2023-12-28 DIAGNOSIS — Z950 Presence of cardiac pacemaker: Secondary | ICD-10-CM | POA: Diagnosis not present

## 2023-12-28 DIAGNOSIS — E039 Hypothyroidism, unspecified: Secondary | ICD-10-CM | POA: Diagnosis not present

## 2023-12-28 DIAGNOSIS — N400 Enlarged prostate without lower urinary tract symptoms: Secondary | ICD-10-CM | POA: Diagnosis not present

## 2023-12-28 DIAGNOSIS — I495 Sick sinus syndrome: Secondary | ICD-10-CM | POA: Diagnosis not present

## 2023-12-28 DIAGNOSIS — Z9181 History of falling: Secondary | ICD-10-CM | POA: Diagnosis not present

## 2023-12-28 DIAGNOSIS — Z87440 Personal history of urinary (tract) infections: Secondary | ICD-10-CM | POA: Diagnosis not present

## 2023-12-28 DIAGNOSIS — Z8673 Personal history of transient ischemic attack (TIA), and cerebral infarction without residual deficits: Secondary | ICD-10-CM | POA: Diagnosis not present

## 2023-12-28 DIAGNOSIS — Z794 Long term (current) use of insulin: Secondary | ICD-10-CM | POA: Diagnosis not present

## 2023-12-28 DIAGNOSIS — H9193 Unspecified hearing loss, bilateral: Secondary | ICD-10-CM | POA: Diagnosis not present

## 2023-12-28 DIAGNOSIS — Z7901 Long term (current) use of anticoagulants: Secondary | ICD-10-CM | POA: Diagnosis not present

## 2023-12-28 DIAGNOSIS — D649 Anemia, unspecified: Secondary | ICD-10-CM | POA: Diagnosis not present

## 2023-12-28 DIAGNOSIS — E1159 Type 2 diabetes mellitus with other circulatory complications: Secondary | ICD-10-CM | POA: Diagnosis not present

## 2023-12-28 DIAGNOSIS — Z955 Presence of coronary angioplasty implant and graft: Secondary | ICD-10-CM | POA: Diagnosis not present

## 2024-01-03 DIAGNOSIS — H9193 Unspecified hearing loss, bilateral: Secondary | ICD-10-CM | POA: Diagnosis not present

## 2024-01-03 DIAGNOSIS — M1990 Unspecified osteoarthritis, unspecified site: Secondary | ICD-10-CM | POA: Diagnosis not present

## 2024-01-03 DIAGNOSIS — N400 Enlarged prostate without lower urinary tract symptoms: Secondary | ICD-10-CM | POA: Diagnosis not present

## 2024-01-03 DIAGNOSIS — Z974 Presence of external hearing-aid: Secondary | ICD-10-CM | POA: Diagnosis not present

## 2024-01-03 DIAGNOSIS — Z950 Presence of cardiac pacemaker: Secondary | ICD-10-CM | POA: Diagnosis not present

## 2024-01-03 DIAGNOSIS — I152 Hypertension secondary to endocrine disorders: Secondary | ICD-10-CM | POA: Diagnosis not present

## 2024-01-03 DIAGNOSIS — E1159 Type 2 diabetes mellitus with other circulatory complications: Secondary | ICD-10-CM | POA: Diagnosis not present

## 2024-01-03 DIAGNOSIS — D649 Anemia, unspecified: Secondary | ICD-10-CM | POA: Diagnosis not present

## 2024-01-03 DIAGNOSIS — I495 Sick sinus syndrome: Secondary | ICD-10-CM | POA: Diagnosis not present

## 2024-01-03 DIAGNOSIS — Z8673 Personal history of transient ischemic attack (TIA), and cerebral infarction without residual deficits: Secondary | ICD-10-CM | POA: Diagnosis not present

## 2024-01-03 DIAGNOSIS — Z9181 History of falling: Secondary | ICD-10-CM | POA: Diagnosis not present

## 2024-01-03 DIAGNOSIS — Z794 Long term (current) use of insulin: Secondary | ICD-10-CM | POA: Diagnosis not present

## 2024-01-03 DIAGNOSIS — Z86718 Personal history of other venous thrombosis and embolism: Secondary | ICD-10-CM | POA: Diagnosis not present

## 2024-01-03 DIAGNOSIS — Z87440 Personal history of urinary (tract) infections: Secondary | ICD-10-CM | POA: Diagnosis not present

## 2024-01-03 DIAGNOSIS — H3530 Unspecified macular degeneration: Secondary | ICD-10-CM | POA: Diagnosis not present

## 2024-01-03 DIAGNOSIS — Z556 Problems related to health literacy: Secondary | ICD-10-CM | POA: Diagnosis not present

## 2024-01-03 DIAGNOSIS — E039 Hypothyroidism, unspecified: Secondary | ICD-10-CM | POA: Diagnosis not present

## 2024-01-03 DIAGNOSIS — Z7901 Long term (current) use of anticoagulants: Secondary | ICD-10-CM | POA: Diagnosis not present

## 2024-01-03 DIAGNOSIS — Z955 Presence of coronary angioplasty implant and graft: Secondary | ICD-10-CM | POA: Diagnosis not present

## 2024-01-03 DIAGNOSIS — E1165 Type 2 diabetes mellitus with hyperglycemia: Secondary | ICD-10-CM | POA: Diagnosis not present

## 2024-01-03 DIAGNOSIS — I2510 Atherosclerotic heart disease of native coronary artery without angina pectoris: Secondary | ICD-10-CM | POA: Diagnosis not present

## 2024-01-03 DIAGNOSIS — I951 Orthostatic hypotension: Secondary | ICD-10-CM | POA: Diagnosis not present

## 2024-01-05 DIAGNOSIS — D649 Anemia, unspecified: Secondary | ICD-10-CM | POA: Diagnosis not present

## 2024-01-05 DIAGNOSIS — H9193 Unspecified hearing loss, bilateral: Secondary | ICD-10-CM | POA: Diagnosis not present

## 2024-01-05 DIAGNOSIS — Z556 Problems related to health literacy: Secondary | ICD-10-CM | POA: Diagnosis not present

## 2024-01-05 DIAGNOSIS — Z9181 History of falling: Secondary | ICD-10-CM | POA: Diagnosis not present

## 2024-01-05 DIAGNOSIS — H3530 Unspecified macular degeneration: Secondary | ICD-10-CM | POA: Diagnosis not present

## 2024-01-05 DIAGNOSIS — Z794 Long term (current) use of insulin: Secondary | ICD-10-CM | POA: Diagnosis not present

## 2024-01-05 DIAGNOSIS — Z86718 Personal history of other venous thrombosis and embolism: Secondary | ICD-10-CM | POA: Diagnosis not present

## 2024-01-05 DIAGNOSIS — Z950 Presence of cardiac pacemaker: Secondary | ICD-10-CM | POA: Diagnosis not present

## 2024-01-05 DIAGNOSIS — M1990 Unspecified osteoarthritis, unspecified site: Secondary | ICD-10-CM | POA: Diagnosis not present

## 2024-01-05 DIAGNOSIS — Z7901 Long term (current) use of anticoagulants: Secondary | ICD-10-CM | POA: Diagnosis not present

## 2024-01-05 DIAGNOSIS — E1165 Type 2 diabetes mellitus with hyperglycemia: Secondary | ICD-10-CM | POA: Diagnosis not present

## 2024-01-05 DIAGNOSIS — I152 Hypertension secondary to endocrine disorders: Secondary | ICD-10-CM | POA: Diagnosis not present

## 2024-01-05 DIAGNOSIS — I495 Sick sinus syndrome: Secondary | ICD-10-CM | POA: Diagnosis not present

## 2024-01-05 DIAGNOSIS — E039 Hypothyroidism, unspecified: Secondary | ICD-10-CM | POA: Diagnosis not present

## 2024-01-05 DIAGNOSIS — Z974 Presence of external hearing-aid: Secondary | ICD-10-CM | POA: Diagnosis not present

## 2024-01-05 DIAGNOSIS — I951 Orthostatic hypotension: Secondary | ICD-10-CM | POA: Diagnosis not present

## 2024-01-05 DIAGNOSIS — Z87440 Personal history of urinary (tract) infections: Secondary | ICD-10-CM | POA: Diagnosis not present

## 2024-01-05 DIAGNOSIS — I2510 Atherosclerotic heart disease of native coronary artery without angina pectoris: Secondary | ICD-10-CM | POA: Diagnosis not present

## 2024-01-05 DIAGNOSIS — N400 Enlarged prostate without lower urinary tract symptoms: Secondary | ICD-10-CM | POA: Diagnosis not present

## 2024-01-05 DIAGNOSIS — E1159 Type 2 diabetes mellitus with other circulatory complications: Secondary | ICD-10-CM | POA: Diagnosis not present

## 2024-01-05 DIAGNOSIS — Z8673 Personal history of transient ischemic attack (TIA), and cerebral infarction without residual deficits: Secondary | ICD-10-CM | POA: Diagnosis not present

## 2024-01-05 DIAGNOSIS — Z955 Presence of coronary angioplasty implant and graft: Secondary | ICD-10-CM | POA: Diagnosis not present

## 2024-01-09 ENCOUNTER — Telehealth: Payer: Self-pay | Admitting: Family Medicine

## 2024-01-09 NOTE — Telephone Encounter (Signed)
 [facility] faxed Home Health Certificate (Order [place] [id number]), to be filled out by provider. Patient requested to send it back via Fax within ASAP. Document is located in providers tray at front office.Please advise at [phone].

## 2024-01-10 DIAGNOSIS — M1990 Unspecified osteoarthritis, unspecified site: Secondary | ICD-10-CM

## 2024-01-10 DIAGNOSIS — E039 Hypothyroidism, unspecified: Secondary | ICD-10-CM

## 2024-01-10 DIAGNOSIS — E1165 Type 2 diabetes mellitus with hyperglycemia: Secondary | ICD-10-CM | POA: Diagnosis not present

## 2024-01-10 DIAGNOSIS — E1159 Type 2 diabetes mellitus with other circulatory complications: Secondary | ICD-10-CM | POA: Diagnosis not present

## 2024-01-10 DIAGNOSIS — G4700 Insomnia, unspecified: Secondary | ICD-10-CM

## 2024-01-10 DIAGNOSIS — I152 Hypertension secondary to endocrine disorders: Secondary | ICD-10-CM | POA: Diagnosis not present

## 2024-01-10 DIAGNOSIS — I2510 Atherosclerotic heart disease of native coronary artery without angina pectoris: Secondary | ICD-10-CM | POA: Diagnosis not present

## 2024-01-10 DIAGNOSIS — N400 Enlarged prostate without lower urinary tract symptoms: Secondary | ICD-10-CM

## 2024-01-10 DIAGNOSIS — D649 Anemia, unspecified: Secondary | ICD-10-CM

## 2024-01-10 DIAGNOSIS — I495 Sick sinus syndrome: Secondary | ICD-10-CM

## 2024-01-10 DIAGNOSIS — I4891 Unspecified atrial fibrillation: Secondary | ICD-10-CM

## 2024-01-10 DIAGNOSIS — I951 Orthostatic hypotension: Secondary | ICD-10-CM

## 2024-01-10 NOTE — Telephone Encounter (Signed)
Form placed to be reviewed in PCP office

## 2024-01-12 DIAGNOSIS — Z955 Presence of coronary angioplasty implant and graft: Secondary | ICD-10-CM | POA: Diagnosis not present

## 2024-01-12 DIAGNOSIS — Z87440 Personal history of urinary (tract) infections: Secondary | ICD-10-CM | POA: Diagnosis not present

## 2024-01-12 DIAGNOSIS — E559 Vitamin D deficiency, unspecified: Secondary | ICD-10-CM | POA: Diagnosis not present

## 2024-01-12 DIAGNOSIS — E1159 Type 2 diabetes mellitus with other circulatory complications: Secondary | ICD-10-CM | POA: Diagnosis not present

## 2024-01-12 DIAGNOSIS — N400 Enlarged prostate without lower urinary tract symptoms: Secondary | ICD-10-CM | POA: Diagnosis not present

## 2024-01-12 DIAGNOSIS — I152 Hypertension secondary to endocrine disorders: Secondary | ICD-10-CM | POA: Diagnosis not present

## 2024-01-12 DIAGNOSIS — Z86718 Personal history of other venous thrombosis and embolism: Secondary | ICD-10-CM | POA: Diagnosis not present

## 2024-01-12 DIAGNOSIS — Z794 Long term (current) use of insulin: Secondary | ICD-10-CM | POA: Diagnosis not present

## 2024-01-12 DIAGNOSIS — I4891 Unspecified atrial fibrillation: Secondary | ICD-10-CM | POA: Diagnosis not present

## 2024-01-12 DIAGNOSIS — H539 Unspecified visual disturbance: Secondary | ICD-10-CM | POA: Diagnosis not present

## 2024-01-12 DIAGNOSIS — Z974 Presence of external hearing-aid: Secondary | ICD-10-CM | POA: Diagnosis not present

## 2024-01-12 DIAGNOSIS — Z8673 Personal history of transient ischemic attack (TIA), and cerebral infarction without residual deficits: Secondary | ICD-10-CM | POA: Diagnosis not present

## 2024-01-12 DIAGNOSIS — H3530 Unspecified macular degeneration: Secondary | ICD-10-CM | POA: Diagnosis not present

## 2024-01-12 DIAGNOSIS — E1165 Type 2 diabetes mellitus with hyperglycemia: Secondary | ICD-10-CM | POA: Diagnosis not present

## 2024-01-12 DIAGNOSIS — I2510 Atherosclerotic heart disease of native coronary artery without angina pectoris: Secondary | ICD-10-CM | POA: Diagnosis not present

## 2024-01-12 DIAGNOSIS — Z950 Presence of cardiac pacemaker: Secondary | ICD-10-CM | POA: Diagnosis not present

## 2024-01-12 DIAGNOSIS — H9193 Unspecified hearing loss, bilateral: Secondary | ICD-10-CM | POA: Diagnosis not present

## 2024-01-12 DIAGNOSIS — E039 Hypothyroidism, unspecified: Secondary | ICD-10-CM | POA: Diagnosis not present

## 2024-01-12 DIAGNOSIS — I951 Orthostatic hypotension: Secondary | ICD-10-CM | POA: Diagnosis not present

## 2024-01-12 DIAGNOSIS — I495 Sick sinus syndrome: Secondary | ICD-10-CM | POA: Diagnosis not present

## 2024-01-12 DIAGNOSIS — Z556 Problems related to health literacy: Secondary | ICD-10-CM | POA: Diagnosis not present

## 2024-01-12 DIAGNOSIS — D649 Anemia, unspecified: Secondary | ICD-10-CM | POA: Diagnosis not present

## 2024-01-12 DIAGNOSIS — G4700 Insomnia, unspecified: Secondary | ICD-10-CM | POA: Diagnosis not present

## 2024-01-12 DIAGNOSIS — Z7901 Long term (current) use of anticoagulants: Secondary | ICD-10-CM | POA: Diagnosis not present

## 2024-01-12 DIAGNOSIS — M1990 Unspecified osteoarthritis, unspecified site: Secondary | ICD-10-CM | POA: Diagnosis not present

## 2024-01-16 DIAGNOSIS — H9193 Unspecified hearing loss, bilateral: Secondary | ICD-10-CM | POA: Diagnosis not present

## 2024-01-16 DIAGNOSIS — Z794 Long term (current) use of insulin: Secondary | ICD-10-CM | POA: Diagnosis not present

## 2024-01-16 DIAGNOSIS — I152 Hypertension secondary to endocrine disorders: Secondary | ICD-10-CM | POA: Diagnosis not present

## 2024-01-16 DIAGNOSIS — Z556 Problems related to health literacy: Secondary | ICD-10-CM | POA: Diagnosis not present

## 2024-01-16 DIAGNOSIS — Z87440 Personal history of urinary (tract) infections: Secondary | ICD-10-CM | POA: Diagnosis not present

## 2024-01-16 DIAGNOSIS — Z86718 Personal history of other venous thrombosis and embolism: Secondary | ICD-10-CM | POA: Diagnosis not present

## 2024-01-16 DIAGNOSIS — E559 Vitamin D deficiency, unspecified: Secondary | ICD-10-CM | POA: Diagnosis not present

## 2024-01-16 DIAGNOSIS — H539 Unspecified visual disturbance: Secondary | ICD-10-CM | POA: Diagnosis not present

## 2024-01-16 DIAGNOSIS — D649 Anemia, unspecified: Secondary | ICD-10-CM | POA: Diagnosis not present

## 2024-01-16 DIAGNOSIS — H3530 Unspecified macular degeneration: Secondary | ICD-10-CM | POA: Diagnosis not present

## 2024-01-16 DIAGNOSIS — N400 Enlarged prostate without lower urinary tract symptoms: Secondary | ICD-10-CM | POA: Diagnosis not present

## 2024-01-16 DIAGNOSIS — M1990 Unspecified osteoarthritis, unspecified site: Secondary | ICD-10-CM | POA: Diagnosis not present

## 2024-01-16 DIAGNOSIS — I2510 Atherosclerotic heart disease of native coronary artery without angina pectoris: Secondary | ICD-10-CM | POA: Diagnosis not present

## 2024-01-16 DIAGNOSIS — E1159 Type 2 diabetes mellitus with other circulatory complications: Secondary | ICD-10-CM | POA: Diagnosis not present

## 2024-01-16 DIAGNOSIS — G4700 Insomnia, unspecified: Secondary | ICD-10-CM | POA: Diagnosis not present

## 2024-01-16 DIAGNOSIS — Z8673 Personal history of transient ischemic attack (TIA), and cerebral infarction without residual deficits: Secondary | ICD-10-CM | POA: Diagnosis not present

## 2024-01-16 DIAGNOSIS — E1165 Type 2 diabetes mellitus with hyperglycemia: Secondary | ICD-10-CM | POA: Diagnosis not present

## 2024-01-16 DIAGNOSIS — Z7901 Long term (current) use of anticoagulants: Secondary | ICD-10-CM | POA: Diagnosis not present

## 2024-01-16 DIAGNOSIS — E039 Hypothyroidism, unspecified: Secondary | ICD-10-CM | POA: Diagnosis not present

## 2024-01-16 DIAGNOSIS — I495 Sick sinus syndrome: Secondary | ICD-10-CM | POA: Diagnosis not present

## 2024-01-16 DIAGNOSIS — Z974 Presence of external hearing-aid: Secondary | ICD-10-CM | POA: Diagnosis not present

## 2024-01-16 DIAGNOSIS — I4891 Unspecified atrial fibrillation: Secondary | ICD-10-CM | POA: Diagnosis not present

## 2024-01-16 DIAGNOSIS — Z955 Presence of coronary angioplasty implant and graft: Secondary | ICD-10-CM | POA: Diagnosis not present

## 2024-01-16 DIAGNOSIS — Z950 Presence of cardiac pacemaker: Secondary | ICD-10-CM | POA: Diagnosis not present

## 2024-01-16 DIAGNOSIS — I951 Orthostatic hypotension: Secondary | ICD-10-CM | POA: Diagnosis not present

## 2024-01-17 DIAGNOSIS — Z950 Presence of cardiac pacemaker: Secondary | ICD-10-CM | POA: Diagnosis not present

## 2024-01-17 DIAGNOSIS — G4700 Insomnia, unspecified: Secondary | ICD-10-CM | POA: Diagnosis not present

## 2024-01-17 DIAGNOSIS — E039 Hypothyroidism, unspecified: Secondary | ICD-10-CM | POA: Diagnosis not present

## 2024-01-17 DIAGNOSIS — I495 Sick sinus syndrome: Secondary | ICD-10-CM | POA: Diagnosis not present

## 2024-01-17 DIAGNOSIS — M1990 Unspecified osteoarthritis, unspecified site: Secondary | ICD-10-CM | POA: Diagnosis not present

## 2024-01-17 DIAGNOSIS — Z7901 Long term (current) use of anticoagulants: Secondary | ICD-10-CM | POA: Diagnosis not present

## 2024-01-17 DIAGNOSIS — Z556 Problems related to health literacy: Secondary | ICD-10-CM | POA: Diagnosis not present

## 2024-01-17 DIAGNOSIS — Z86718 Personal history of other venous thrombosis and embolism: Secondary | ICD-10-CM | POA: Diagnosis not present

## 2024-01-17 DIAGNOSIS — H9193 Unspecified hearing loss, bilateral: Secondary | ICD-10-CM | POA: Diagnosis not present

## 2024-01-17 DIAGNOSIS — H3530 Unspecified macular degeneration: Secondary | ICD-10-CM | POA: Diagnosis not present

## 2024-01-17 DIAGNOSIS — D649 Anemia, unspecified: Secondary | ICD-10-CM | POA: Diagnosis not present

## 2024-01-17 DIAGNOSIS — Z955 Presence of coronary angioplasty implant and graft: Secondary | ICD-10-CM | POA: Diagnosis not present

## 2024-01-17 DIAGNOSIS — Z8673 Personal history of transient ischemic attack (TIA), and cerebral infarction without residual deficits: Secondary | ICD-10-CM | POA: Diagnosis not present

## 2024-01-17 DIAGNOSIS — Z974 Presence of external hearing-aid: Secondary | ICD-10-CM | POA: Diagnosis not present

## 2024-01-17 DIAGNOSIS — N400 Enlarged prostate without lower urinary tract symptoms: Secondary | ICD-10-CM | POA: Diagnosis not present

## 2024-01-17 DIAGNOSIS — I4891 Unspecified atrial fibrillation: Secondary | ICD-10-CM | POA: Diagnosis not present

## 2024-01-17 DIAGNOSIS — I152 Hypertension secondary to endocrine disorders: Secondary | ICD-10-CM | POA: Diagnosis not present

## 2024-01-17 DIAGNOSIS — I2510 Atherosclerotic heart disease of native coronary artery without angina pectoris: Secondary | ICD-10-CM | POA: Diagnosis not present

## 2024-01-17 DIAGNOSIS — E1165 Type 2 diabetes mellitus with hyperglycemia: Secondary | ICD-10-CM | POA: Diagnosis not present

## 2024-01-17 DIAGNOSIS — E1159 Type 2 diabetes mellitus with other circulatory complications: Secondary | ICD-10-CM | POA: Diagnosis not present

## 2024-01-17 DIAGNOSIS — E559 Vitamin D deficiency, unspecified: Secondary | ICD-10-CM | POA: Diagnosis not present

## 2024-01-17 DIAGNOSIS — H539 Unspecified visual disturbance: Secondary | ICD-10-CM | POA: Diagnosis not present

## 2024-01-17 DIAGNOSIS — Z87440 Personal history of urinary (tract) infections: Secondary | ICD-10-CM | POA: Diagnosis not present

## 2024-01-17 DIAGNOSIS — I951 Orthostatic hypotension: Secondary | ICD-10-CM | POA: Diagnosis not present

## 2024-01-17 DIAGNOSIS — Z794 Long term (current) use of insulin: Secondary | ICD-10-CM | POA: Diagnosis not present

## 2024-01-18 DIAGNOSIS — Z8673 Personal history of transient ischemic attack (TIA), and cerebral infarction without residual deficits: Secondary | ICD-10-CM | POA: Diagnosis not present

## 2024-01-18 DIAGNOSIS — E1165 Type 2 diabetes mellitus with hyperglycemia: Secondary | ICD-10-CM | POA: Diagnosis not present

## 2024-01-18 DIAGNOSIS — Z87440 Personal history of urinary (tract) infections: Secondary | ICD-10-CM | POA: Diagnosis not present

## 2024-01-18 DIAGNOSIS — D649 Anemia, unspecified: Secondary | ICD-10-CM | POA: Diagnosis not present

## 2024-01-18 DIAGNOSIS — I4891 Unspecified atrial fibrillation: Secondary | ICD-10-CM | POA: Diagnosis not present

## 2024-01-18 DIAGNOSIS — H9193 Unspecified hearing loss, bilateral: Secondary | ICD-10-CM | POA: Diagnosis not present

## 2024-01-18 DIAGNOSIS — E1159 Type 2 diabetes mellitus with other circulatory complications: Secondary | ICD-10-CM | POA: Diagnosis not present

## 2024-01-18 DIAGNOSIS — I495 Sick sinus syndrome: Secondary | ICD-10-CM | POA: Diagnosis not present

## 2024-01-18 DIAGNOSIS — E559 Vitamin D deficiency, unspecified: Secondary | ICD-10-CM | POA: Diagnosis not present

## 2024-01-18 DIAGNOSIS — H3530 Unspecified macular degeneration: Secondary | ICD-10-CM | POA: Diagnosis not present

## 2024-01-18 DIAGNOSIS — I2510 Atherosclerotic heart disease of native coronary artery without angina pectoris: Secondary | ICD-10-CM | POA: Diagnosis not present

## 2024-01-18 DIAGNOSIS — N400 Enlarged prostate without lower urinary tract symptoms: Secondary | ICD-10-CM | POA: Diagnosis not present

## 2024-01-18 DIAGNOSIS — H539 Unspecified visual disturbance: Secondary | ICD-10-CM | POA: Diagnosis not present

## 2024-01-18 DIAGNOSIS — I951 Orthostatic hypotension: Secondary | ICD-10-CM | POA: Diagnosis not present

## 2024-01-18 DIAGNOSIS — Z955 Presence of coronary angioplasty implant and graft: Secondary | ICD-10-CM | POA: Diagnosis not present

## 2024-01-18 DIAGNOSIS — Z86718 Personal history of other venous thrombosis and embolism: Secondary | ICD-10-CM | POA: Diagnosis not present

## 2024-01-18 DIAGNOSIS — M1990 Unspecified osteoarthritis, unspecified site: Secondary | ICD-10-CM | POA: Diagnosis not present

## 2024-01-18 DIAGNOSIS — Z7901 Long term (current) use of anticoagulants: Secondary | ICD-10-CM | POA: Diagnosis not present

## 2024-01-18 DIAGNOSIS — Z950 Presence of cardiac pacemaker: Secondary | ICD-10-CM | POA: Diagnosis not present

## 2024-01-18 DIAGNOSIS — Z974 Presence of external hearing-aid: Secondary | ICD-10-CM | POA: Diagnosis not present

## 2024-01-18 DIAGNOSIS — I152 Hypertension secondary to endocrine disorders: Secondary | ICD-10-CM | POA: Diagnosis not present

## 2024-01-18 DIAGNOSIS — Z556 Problems related to health literacy: Secondary | ICD-10-CM | POA: Diagnosis not present

## 2024-01-18 DIAGNOSIS — Z794 Long term (current) use of insulin: Secondary | ICD-10-CM | POA: Diagnosis not present

## 2024-01-18 DIAGNOSIS — G4700 Insomnia, unspecified: Secondary | ICD-10-CM | POA: Diagnosis not present

## 2024-01-18 DIAGNOSIS — E039 Hypothyroidism, unspecified: Secondary | ICD-10-CM | POA: Diagnosis not present

## 2024-01-25 DIAGNOSIS — Z87440 Personal history of urinary (tract) infections: Secondary | ICD-10-CM | POA: Diagnosis not present

## 2024-01-25 DIAGNOSIS — N400 Enlarged prostate without lower urinary tract symptoms: Secondary | ICD-10-CM | POA: Diagnosis not present

## 2024-01-25 DIAGNOSIS — Z8673 Personal history of transient ischemic attack (TIA), and cerebral infarction without residual deficits: Secondary | ICD-10-CM | POA: Diagnosis not present

## 2024-01-25 DIAGNOSIS — Z794 Long term (current) use of insulin: Secondary | ICD-10-CM | POA: Diagnosis not present

## 2024-01-25 DIAGNOSIS — I4891 Unspecified atrial fibrillation: Secondary | ICD-10-CM | POA: Diagnosis not present

## 2024-01-25 DIAGNOSIS — G4700 Insomnia, unspecified: Secondary | ICD-10-CM | POA: Diagnosis not present

## 2024-01-25 DIAGNOSIS — D649 Anemia, unspecified: Secondary | ICD-10-CM | POA: Diagnosis not present

## 2024-01-25 DIAGNOSIS — I951 Orthostatic hypotension: Secondary | ICD-10-CM | POA: Diagnosis not present

## 2024-01-25 DIAGNOSIS — I2510 Atherosclerotic heart disease of native coronary artery without angina pectoris: Secondary | ICD-10-CM | POA: Diagnosis not present

## 2024-01-25 DIAGNOSIS — H539 Unspecified visual disturbance: Secondary | ICD-10-CM | POA: Diagnosis not present

## 2024-01-25 DIAGNOSIS — Z974 Presence of external hearing-aid: Secondary | ICD-10-CM | POA: Diagnosis not present

## 2024-01-25 DIAGNOSIS — Z86718 Personal history of other venous thrombosis and embolism: Secondary | ICD-10-CM | POA: Diagnosis not present

## 2024-01-25 DIAGNOSIS — H3530 Unspecified macular degeneration: Secondary | ICD-10-CM | POA: Diagnosis not present

## 2024-01-25 DIAGNOSIS — I495 Sick sinus syndrome: Secondary | ICD-10-CM | POA: Diagnosis not present

## 2024-01-25 DIAGNOSIS — Z556 Problems related to health literacy: Secondary | ICD-10-CM | POA: Diagnosis not present

## 2024-01-25 DIAGNOSIS — M1990 Unspecified osteoarthritis, unspecified site: Secondary | ICD-10-CM | POA: Diagnosis not present

## 2024-01-25 DIAGNOSIS — Z7901 Long term (current) use of anticoagulants: Secondary | ICD-10-CM | POA: Diagnosis not present

## 2024-01-25 DIAGNOSIS — I152 Hypertension secondary to endocrine disorders: Secondary | ICD-10-CM | POA: Diagnosis not present

## 2024-01-25 DIAGNOSIS — E1159 Type 2 diabetes mellitus with other circulatory complications: Secondary | ICD-10-CM | POA: Diagnosis not present

## 2024-01-25 DIAGNOSIS — H9193 Unspecified hearing loss, bilateral: Secondary | ICD-10-CM | POA: Diagnosis not present

## 2024-01-25 DIAGNOSIS — Z950 Presence of cardiac pacemaker: Secondary | ICD-10-CM | POA: Diagnosis not present

## 2024-01-25 DIAGNOSIS — Z955 Presence of coronary angioplasty implant and graft: Secondary | ICD-10-CM | POA: Diagnosis not present

## 2024-01-25 DIAGNOSIS — E039 Hypothyroidism, unspecified: Secondary | ICD-10-CM | POA: Diagnosis not present

## 2024-01-25 DIAGNOSIS — E559 Vitamin D deficiency, unspecified: Secondary | ICD-10-CM | POA: Diagnosis not present

## 2024-01-25 DIAGNOSIS — E1165 Type 2 diabetes mellitus with hyperglycemia: Secondary | ICD-10-CM | POA: Diagnosis not present

## 2024-01-27 ENCOUNTER — Ambulatory Visit (INDEPENDENT_AMBULATORY_CARE_PROVIDER_SITE_OTHER): Payer: Medicare Other

## 2024-01-27 DIAGNOSIS — Z86718 Personal history of other venous thrombosis and embolism: Secondary | ICD-10-CM | POA: Diagnosis not present

## 2024-01-27 DIAGNOSIS — H539 Unspecified visual disturbance: Secondary | ICD-10-CM | POA: Diagnosis not present

## 2024-01-27 DIAGNOSIS — I2510 Atherosclerotic heart disease of native coronary artery without angina pectoris: Secondary | ICD-10-CM | POA: Diagnosis not present

## 2024-01-27 DIAGNOSIS — H9193 Unspecified hearing loss, bilateral: Secondary | ICD-10-CM | POA: Diagnosis not present

## 2024-01-27 DIAGNOSIS — E1159 Type 2 diabetes mellitus with other circulatory complications: Secondary | ICD-10-CM | POA: Diagnosis not present

## 2024-01-27 DIAGNOSIS — Z950 Presence of cardiac pacemaker: Secondary | ICD-10-CM | POA: Diagnosis not present

## 2024-01-27 DIAGNOSIS — I951 Orthostatic hypotension: Secondary | ICD-10-CM | POA: Diagnosis not present

## 2024-01-27 DIAGNOSIS — G4700 Insomnia, unspecified: Secondary | ICD-10-CM | POA: Diagnosis not present

## 2024-01-27 DIAGNOSIS — M1990 Unspecified osteoarthritis, unspecified site: Secondary | ICD-10-CM | POA: Diagnosis not present

## 2024-01-27 DIAGNOSIS — N400 Enlarged prostate without lower urinary tract symptoms: Secondary | ICD-10-CM | POA: Diagnosis not present

## 2024-01-27 DIAGNOSIS — H3530 Unspecified macular degeneration: Secondary | ICD-10-CM | POA: Diagnosis not present

## 2024-01-27 DIAGNOSIS — Z8673 Personal history of transient ischemic attack (TIA), and cerebral infarction without residual deficits: Secondary | ICD-10-CM | POA: Diagnosis not present

## 2024-01-27 DIAGNOSIS — I152 Hypertension secondary to endocrine disorders: Secondary | ICD-10-CM | POA: Diagnosis not present

## 2024-01-27 DIAGNOSIS — E1165 Type 2 diabetes mellitus with hyperglycemia: Secondary | ICD-10-CM | POA: Diagnosis not present

## 2024-01-27 DIAGNOSIS — Z794 Long term (current) use of insulin: Secondary | ICD-10-CM | POA: Diagnosis not present

## 2024-01-27 DIAGNOSIS — E559 Vitamin D deficiency, unspecified: Secondary | ICD-10-CM | POA: Diagnosis not present

## 2024-01-27 DIAGNOSIS — I4891 Unspecified atrial fibrillation: Secondary | ICD-10-CM | POA: Diagnosis not present

## 2024-01-27 DIAGNOSIS — Z955 Presence of coronary angioplasty implant and graft: Secondary | ICD-10-CM | POA: Diagnosis not present

## 2024-01-27 DIAGNOSIS — Z556 Problems related to health literacy: Secondary | ICD-10-CM | POA: Diagnosis not present

## 2024-01-27 DIAGNOSIS — I495 Sick sinus syndrome: Secondary | ICD-10-CM

## 2024-01-27 DIAGNOSIS — D649 Anemia, unspecified: Secondary | ICD-10-CM | POA: Diagnosis not present

## 2024-01-27 DIAGNOSIS — Z974 Presence of external hearing-aid: Secondary | ICD-10-CM | POA: Diagnosis not present

## 2024-01-27 DIAGNOSIS — E039 Hypothyroidism, unspecified: Secondary | ICD-10-CM | POA: Diagnosis not present

## 2024-01-27 DIAGNOSIS — Z7901 Long term (current) use of anticoagulants: Secondary | ICD-10-CM | POA: Diagnosis not present

## 2024-01-27 DIAGNOSIS — Z87440 Personal history of urinary (tract) infections: Secondary | ICD-10-CM | POA: Diagnosis not present

## 2024-01-27 LAB — CUP PACEART REMOTE DEVICE CHECK
Battery Remaining Longevity: 105 mo
Battery Voltage: 2.99 V
Brady Statistic AP VP Percent: 0.08 %
Brady Statistic AP VS Percent: 97.37 %
Brady Statistic AS VP Percent: 0 %
Brady Statistic AS VS Percent: 2.55 %
Brady Statistic RA Percent Paced: 97.47 %
Brady Statistic RV Percent Paced: 0.08 %
Date Time Interrogation Session: 20250801041647
Implantable Lead Connection Status: 753985
Implantable Lead Connection Status: 753985
Implantable Lead Implant Date: 20081106
Implantable Lead Implant Date: 20210210
Implantable Lead Location: 753859
Implantable Lead Location: 753860
Implantable Lead Model: 4076
Implantable Lead Model: 4076
Implantable Pulse Generator Implant Date: 20210210
Lead Channel Impedance Value: 285 Ohm
Lead Channel Impedance Value: 361 Ohm
Lead Channel Impedance Value: 361 Ohm
Lead Channel Impedance Value: 532 Ohm
Lead Channel Pacing Threshold Amplitude: 0.75 V
Lead Channel Pacing Threshold Amplitude: 0.75 V
Lead Channel Pacing Threshold Pulse Width: 0.4 ms
Lead Channel Pacing Threshold Pulse Width: 0.4 ms
Lead Channel Sensing Intrinsic Amplitude: 1.25 mV
Lead Channel Sensing Intrinsic Amplitude: 1.25 mV
Lead Channel Sensing Intrinsic Amplitude: 13.125 mV
Lead Channel Sensing Intrinsic Amplitude: 13.125 mV
Lead Channel Setting Pacing Amplitude: 1.5 V
Lead Channel Setting Pacing Amplitude: 2 V
Lead Channel Setting Pacing Pulse Width: 0.4 ms
Lead Channel Setting Sensing Sensitivity: 0.9 mV
Zone Setting Status: 755011
Zone Setting Status: 755011

## 2024-01-28 ENCOUNTER — Ambulatory Visit: Payer: Self-pay | Admitting: Internal Medicine

## 2024-01-28 DIAGNOSIS — Z950 Presence of cardiac pacemaker: Secondary | ICD-10-CM | POA: Diagnosis not present

## 2024-01-28 DIAGNOSIS — Z974 Presence of external hearing-aid: Secondary | ICD-10-CM | POA: Diagnosis not present

## 2024-01-28 DIAGNOSIS — Z86718 Personal history of other venous thrombosis and embolism: Secondary | ICD-10-CM | POA: Diagnosis not present

## 2024-01-28 DIAGNOSIS — E1159 Type 2 diabetes mellitus with other circulatory complications: Secondary | ICD-10-CM | POA: Diagnosis not present

## 2024-01-28 DIAGNOSIS — H9193 Unspecified hearing loss, bilateral: Secondary | ICD-10-CM | POA: Diagnosis not present

## 2024-01-28 DIAGNOSIS — D649 Anemia, unspecified: Secondary | ICD-10-CM | POA: Diagnosis not present

## 2024-01-28 DIAGNOSIS — Z7901 Long term (current) use of anticoagulants: Secondary | ICD-10-CM | POA: Diagnosis not present

## 2024-01-28 DIAGNOSIS — E559 Vitamin D deficiency, unspecified: Secondary | ICD-10-CM | POA: Diagnosis not present

## 2024-01-28 DIAGNOSIS — E039 Hypothyroidism, unspecified: Secondary | ICD-10-CM | POA: Diagnosis not present

## 2024-01-28 DIAGNOSIS — N400 Enlarged prostate without lower urinary tract symptoms: Secondary | ICD-10-CM | POA: Diagnosis not present

## 2024-01-28 DIAGNOSIS — I951 Orthostatic hypotension: Secondary | ICD-10-CM | POA: Diagnosis not present

## 2024-01-28 DIAGNOSIS — Z794 Long term (current) use of insulin: Secondary | ICD-10-CM | POA: Diagnosis not present

## 2024-01-28 DIAGNOSIS — Z8673 Personal history of transient ischemic attack (TIA), and cerebral infarction without residual deficits: Secondary | ICD-10-CM | POA: Diagnosis not present

## 2024-01-28 DIAGNOSIS — Z955 Presence of coronary angioplasty implant and graft: Secondary | ICD-10-CM | POA: Diagnosis not present

## 2024-01-28 DIAGNOSIS — Z556 Problems related to health literacy: Secondary | ICD-10-CM | POA: Diagnosis not present

## 2024-01-28 DIAGNOSIS — Z87440 Personal history of urinary (tract) infections: Secondary | ICD-10-CM | POA: Diagnosis not present

## 2024-01-28 DIAGNOSIS — M1990 Unspecified osteoarthritis, unspecified site: Secondary | ICD-10-CM | POA: Diagnosis not present

## 2024-01-28 DIAGNOSIS — I152 Hypertension secondary to endocrine disorders: Secondary | ICD-10-CM | POA: Diagnosis not present

## 2024-01-28 DIAGNOSIS — G4700 Insomnia, unspecified: Secondary | ICD-10-CM | POA: Diagnosis not present

## 2024-01-28 DIAGNOSIS — H3530 Unspecified macular degeneration: Secondary | ICD-10-CM | POA: Diagnosis not present

## 2024-01-28 DIAGNOSIS — I4891 Unspecified atrial fibrillation: Secondary | ICD-10-CM | POA: Diagnosis not present

## 2024-01-28 DIAGNOSIS — H539 Unspecified visual disturbance: Secondary | ICD-10-CM | POA: Diagnosis not present

## 2024-01-28 DIAGNOSIS — I2510 Atherosclerotic heart disease of native coronary artery without angina pectoris: Secondary | ICD-10-CM | POA: Diagnosis not present

## 2024-01-28 DIAGNOSIS — I495 Sick sinus syndrome: Secondary | ICD-10-CM | POA: Diagnosis not present

## 2024-01-28 DIAGNOSIS — E1165 Type 2 diabetes mellitus with hyperglycemia: Secondary | ICD-10-CM | POA: Diagnosis not present

## 2024-02-02 DIAGNOSIS — H9193 Unspecified hearing loss, bilateral: Secondary | ICD-10-CM | POA: Diagnosis not present

## 2024-02-02 DIAGNOSIS — E039 Hypothyroidism, unspecified: Secondary | ICD-10-CM | POA: Diagnosis not present

## 2024-02-02 DIAGNOSIS — Z974 Presence of external hearing-aid: Secondary | ICD-10-CM | POA: Diagnosis not present

## 2024-02-02 DIAGNOSIS — Z556 Problems related to health literacy: Secondary | ICD-10-CM | POA: Diagnosis not present

## 2024-02-02 DIAGNOSIS — H3530 Unspecified macular degeneration: Secondary | ICD-10-CM | POA: Diagnosis not present

## 2024-02-02 DIAGNOSIS — I495 Sick sinus syndrome: Secondary | ICD-10-CM | POA: Diagnosis not present

## 2024-02-02 DIAGNOSIS — I152 Hypertension secondary to endocrine disorders: Secondary | ICD-10-CM | POA: Diagnosis not present

## 2024-02-02 DIAGNOSIS — E1159 Type 2 diabetes mellitus with other circulatory complications: Secondary | ICD-10-CM | POA: Diagnosis not present

## 2024-02-02 DIAGNOSIS — E559 Vitamin D deficiency, unspecified: Secondary | ICD-10-CM | POA: Diagnosis not present

## 2024-02-02 DIAGNOSIS — M1990 Unspecified osteoarthritis, unspecified site: Secondary | ICD-10-CM | POA: Diagnosis not present

## 2024-02-02 DIAGNOSIS — N400 Enlarged prostate without lower urinary tract symptoms: Secondary | ICD-10-CM | POA: Diagnosis not present

## 2024-02-02 DIAGNOSIS — Z950 Presence of cardiac pacemaker: Secondary | ICD-10-CM | POA: Diagnosis not present

## 2024-02-02 DIAGNOSIS — Z8673 Personal history of transient ischemic attack (TIA), and cerebral infarction without residual deficits: Secondary | ICD-10-CM | POA: Diagnosis not present

## 2024-02-02 DIAGNOSIS — Z794 Long term (current) use of insulin: Secondary | ICD-10-CM | POA: Diagnosis not present

## 2024-02-02 DIAGNOSIS — G4700 Insomnia, unspecified: Secondary | ICD-10-CM | POA: Diagnosis not present

## 2024-02-02 DIAGNOSIS — Z86718 Personal history of other venous thrombosis and embolism: Secondary | ICD-10-CM | POA: Diagnosis not present

## 2024-02-02 DIAGNOSIS — I2510 Atherosclerotic heart disease of native coronary artery without angina pectoris: Secondary | ICD-10-CM | POA: Diagnosis not present

## 2024-02-02 DIAGNOSIS — I4891 Unspecified atrial fibrillation: Secondary | ICD-10-CM | POA: Diagnosis not present

## 2024-02-02 DIAGNOSIS — E1165 Type 2 diabetes mellitus with hyperglycemia: Secondary | ICD-10-CM | POA: Diagnosis not present

## 2024-02-02 DIAGNOSIS — Z955 Presence of coronary angioplasty implant and graft: Secondary | ICD-10-CM | POA: Diagnosis not present

## 2024-02-02 DIAGNOSIS — I951 Orthostatic hypotension: Secondary | ICD-10-CM | POA: Diagnosis not present

## 2024-02-02 DIAGNOSIS — H539 Unspecified visual disturbance: Secondary | ICD-10-CM | POA: Diagnosis not present

## 2024-02-02 DIAGNOSIS — Z87440 Personal history of urinary (tract) infections: Secondary | ICD-10-CM | POA: Diagnosis not present

## 2024-02-02 DIAGNOSIS — D649 Anemia, unspecified: Secondary | ICD-10-CM | POA: Diagnosis not present

## 2024-02-02 DIAGNOSIS — Z7901 Long term (current) use of anticoagulants: Secondary | ICD-10-CM | POA: Diagnosis not present

## 2024-02-03 ENCOUNTER — Ambulatory Visit: Admitting: Family Medicine

## 2024-02-03 DIAGNOSIS — H539 Unspecified visual disturbance: Secondary | ICD-10-CM | POA: Diagnosis not present

## 2024-02-03 DIAGNOSIS — N400 Enlarged prostate without lower urinary tract symptoms: Secondary | ICD-10-CM | POA: Diagnosis not present

## 2024-02-03 DIAGNOSIS — I4891 Unspecified atrial fibrillation: Secondary | ICD-10-CM | POA: Diagnosis not present

## 2024-02-03 DIAGNOSIS — M1990 Unspecified osteoarthritis, unspecified site: Secondary | ICD-10-CM | POA: Diagnosis not present

## 2024-02-03 DIAGNOSIS — H3530 Unspecified macular degeneration: Secondary | ICD-10-CM | POA: Diagnosis not present

## 2024-02-03 DIAGNOSIS — I2510 Atherosclerotic heart disease of native coronary artery without angina pectoris: Secondary | ICD-10-CM | POA: Diagnosis not present

## 2024-02-03 DIAGNOSIS — I495 Sick sinus syndrome: Secondary | ICD-10-CM | POA: Diagnosis not present

## 2024-02-03 DIAGNOSIS — G4700 Insomnia, unspecified: Secondary | ICD-10-CM | POA: Diagnosis not present

## 2024-02-03 DIAGNOSIS — D649 Anemia, unspecified: Secondary | ICD-10-CM | POA: Diagnosis not present

## 2024-02-03 DIAGNOSIS — Z87440 Personal history of urinary (tract) infections: Secondary | ICD-10-CM | POA: Diagnosis not present

## 2024-02-03 DIAGNOSIS — Z8673 Personal history of transient ischemic attack (TIA), and cerebral infarction without residual deficits: Secondary | ICD-10-CM | POA: Diagnosis not present

## 2024-02-03 DIAGNOSIS — I152 Hypertension secondary to endocrine disorders: Secondary | ICD-10-CM | POA: Diagnosis not present

## 2024-02-03 DIAGNOSIS — Z950 Presence of cardiac pacemaker: Secondary | ICD-10-CM | POA: Diagnosis not present

## 2024-02-03 DIAGNOSIS — H9193 Unspecified hearing loss, bilateral: Secondary | ICD-10-CM | POA: Diagnosis not present

## 2024-02-03 DIAGNOSIS — I951 Orthostatic hypotension: Secondary | ICD-10-CM | POA: Diagnosis not present

## 2024-02-03 DIAGNOSIS — Z955 Presence of coronary angioplasty implant and graft: Secondary | ICD-10-CM | POA: Diagnosis not present

## 2024-02-03 DIAGNOSIS — E039 Hypothyroidism, unspecified: Secondary | ICD-10-CM | POA: Diagnosis not present

## 2024-02-03 DIAGNOSIS — Z556 Problems related to health literacy: Secondary | ICD-10-CM | POA: Diagnosis not present

## 2024-02-03 DIAGNOSIS — Z974 Presence of external hearing-aid: Secondary | ICD-10-CM | POA: Diagnosis not present

## 2024-02-03 DIAGNOSIS — E559 Vitamin D deficiency, unspecified: Secondary | ICD-10-CM | POA: Diagnosis not present

## 2024-02-03 DIAGNOSIS — E1159 Type 2 diabetes mellitus with other circulatory complications: Secondary | ICD-10-CM | POA: Diagnosis not present

## 2024-02-03 DIAGNOSIS — E1165 Type 2 diabetes mellitus with hyperglycemia: Secondary | ICD-10-CM | POA: Diagnosis not present

## 2024-02-03 DIAGNOSIS — Z7901 Long term (current) use of anticoagulants: Secondary | ICD-10-CM | POA: Diagnosis not present

## 2024-02-03 DIAGNOSIS — Z794 Long term (current) use of insulin: Secondary | ICD-10-CM | POA: Diagnosis not present

## 2024-02-03 DIAGNOSIS — Z86718 Personal history of other venous thrombosis and embolism: Secondary | ICD-10-CM | POA: Diagnosis not present

## 2024-02-06 ENCOUNTER — Encounter: Payer: Self-pay | Admitting: Family Medicine

## 2024-02-06 ENCOUNTER — Ambulatory Visit: Admitting: Family Medicine

## 2024-02-06 VITALS — BP 107/73 | HR 72 | Temp 97.2°F | Ht 68.5 in | Wt 179.0 lb

## 2024-02-06 DIAGNOSIS — E1159 Type 2 diabetes mellitus with other circulatory complications: Secondary | ICD-10-CM

## 2024-02-06 DIAGNOSIS — E1165 Type 2 diabetes mellitus with hyperglycemia: Secondary | ICD-10-CM | POA: Diagnosis not present

## 2024-02-06 DIAGNOSIS — L219 Seborrheic dermatitis, unspecified: Secondary | ICD-10-CM | POA: Insufficient documentation

## 2024-02-06 DIAGNOSIS — L570 Actinic keratosis: Secondary | ICD-10-CM | POA: Insufficient documentation

## 2024-02-06 DIAGNOSIS — Z794 Long term (current) use of insulin: Secondary | ICD-10-CM

## 2024-02-06 DIAGNOSIS — I152 Hypertension secondary to endocrine disorders: Secondary | ICD-10-CM

## 2024-02-06 LAB — POCT GLYCOSYLATED HEMOGLOBIN (HGB A1C): Hemoglobin A1C: 8.3 % — AB (ref 4.0–5.6)

## 2024-02-06 MED ORDER — KETOCONAZOLE 2 % EX CREA
1.0000 | TOPICAL_CREAM | Freq: Two times a day (BID) | CUTANEOUS | 0 refills | Status: DC
Start: 2024-02-06 — End: 2024-03-26

## 2024-02-06 NOTE — Assessment & Plan Note (Signed)
 A1c stable 8.3 on Lantus  13 units daily.  Tolerating well without significant lows.  His home readings typically are at goal.  Will continue current regimen.  Recheck in 3 months.

## 2024-02-06 NOTE — Assessment & Plan Note (Signed)
 Area behind his ear consistent with seborrheic dermatitis.  Will start topical ketoconazole  cream.

## 2024-02-06 NOTE — Progress Notes (Signed)
   Thomas [name] is a 89 y.o. male who presents today for an office visit.  Assessment/Plan:  Chronic Problems Addressed Today: Type 2 diabetes mellitus with hyperglycemia ([name]) A1c stable 8.3 on Lantus  13 units daily.  Tolerating well without significant lows.  His home readings typically are at goal.  Will continue current regimen.  Recheck in 3 months.  Actinic keratosis Cryotherapy applied today.  See below procedure note.  He is tolerating well.  Seborrheic dermatitis Area behind his ear consistent with seborrheic dermatitis.  Will start topical ketoconazole  cream.  Hypertension associated with diabetes ([name]) At goal today.  12s with cardiology for labile blood pressure readings.  He is on Coreg  3.125 mg twice daily and midodrine  2.5 mg 3 times daily.     Subjective:  HPI:  See assessment / plan for status of chronic conditions.   Patient here today for 41-month follow-up.  Saw him 3 months ago.  At that time A1c was 8.5 on Lantus  15 units daily.  Discussed the use of AI scribe software for clinical note transcription with the patient, who gave verbal consent to proceed.  History of Present Illness Thomas [name] is a 89 year old male with diabetes who presents for a three-month follow-up.  Three months ago, his A1c was 8.5% while on Lantus , 15 units daily. His current A1c is 8.3%. He checks his blood sugar at home, with the highest reading being 140 mg/dL. He continues to take Lantus , 15 units daily.  He has a skin lesion on his arm that he reports as annoying, especially when skiing in the morning. He has had similar lesions treated before. He describes it as annoying.  He mentions a sore spot behind his ear, which previously caused pain but does not currently hurt. He has been applying a topical treatment, which alleviated the pain.  No new symptoms or concerns are reported. His blood pressure has been stable, and he reports no issues with his current medications. He is  not aware of any medication shortages or issues, as his wife usually informs him if there are any problems.         Objective:  Physical Exam: BP 107/73   Pulse 72   Temp (!) 97.2 F (36.2 C) (Temporal)   Ht 5' 8.5 (1.74 m)   Wt 179 lb (81.2 kg)   SpO2 96%   BMI 26.82 kg/m   Gen: No acute distress, resting comfortably CV: Regular rate and rhythm with no murmurs appreciated Pulm: Normal work of breathing, clear to auscultation bilaterally with no crackles, wheezes, or rhonchi Skin: Hyperkeratotic lesion noted on dorsal right hand.  Erythematous flaky rash on posterior right ear Neuro: Grossly normal, moves all extremities Psych: Normal affect and thought content  Cryotherapy Procedure Note  Pre-operative Diagnosis: Actinic keratosis  Locations: Right Hand  Indications: Therapeutic  Procedure Details  Patient informed of risks (permanent scarring, infection, light or dark discoloration, bleeding, infection, weakness, numbness and recurrence of the lesion) and benefits of the procedure and verbal informed consent obtained.  The areas are treated with liquid nitrogen therapy, frozen until ice ball extended 3 mm beyond lesion, allowed to thaw, and treated again. The patient tolerated procedure well.  The patient was instructed on post-op care, warned that there may be blister formation, redness and pain. Recommend OTC analgesia as needed for pain.  Condition: Stable  Complications: none.       Worth HERO. Kennyth, MD 02/06/2024 1:49 PM

## 2024-02-06 NOTE — Assessment & Plan Note (Addendum)
 Cryotherapy applied today.  See below procedure note.  He tolerated well.  Discussed reasons to return to care.

## 2024-02-06 NOTE — Patient Instructions (Addendum)
 It was very nice to see you today!  VISIT SUMMARY: You had a follow-up visit to check on your diabetes, skin lesion, and a sore spot behind your ear. Your blood sugar levels have improved slightly, and your blood pressure remains stable.  YOUR PLAN: TYPE 2 DIABETES MELLITUS: Your A1c has improved to 8.3%, and your blood sugar levels are stable with your current medication. -Continue taking Lantus  15 units daily. -Monitor your blood sugar levels at home regularly.  ACTINIC KERATOSIS OF Hand: You have a skin lesion on your hand that has increased in size, likely due to sun damage. -We will proceed with cryotherapy to freeze the lesion. - Let us  know if not improving  SEBORRHEIC DERMATITIS OF EAR: You have seborrheic dermatitis behind your ear -Apply ketoconazole  cream to the affected area twice daily for a couple of weeks.  HYPERTENSION: Your blood pressure is well-controlled. -Continue your current blood pressure management.  Return in about 3 months (around 05/08/2024).   Take care, Dr Kennyth  PLEASE NOTE:  If you had any lab tests, please let us  know if you have not heard back within a few days. You may see your results on mychart before we have a chance to review them but we will give you a call once they are reviewed by us .   If we ordered any referrals today, please let us  know if you have not heard from their office within the next week.   If you had any urgent prescriptions sent in today, please check with the pharmacy within an hour of our visit to make sure the prescription was transmitted appropriately.   Please try these tips to maintain a healthy lifestyle:  Eat at least 3 REAL meals and 1-2 snacks per day.  Aim for no more than 5 hours between eating.  If you eat breakfast, please do so within one hour of getting up.   Each meal should contain half fruits/vegetables, one quarter protein, and one quarter carbs (no bigger than a computer mouse)  Cut down on sweet  beverages. This includes juice, soda, and sweet tea.   Drink at least 1 glass of water with each meal and aim for at least 8 glasses per day  Exercise at least 150 minutes every week.

## 2024-02-06 NOTE — Assessment & Plan Note (Signed)
 At goal today.  12s with cardiology for labile blood pressure readings.  He is on Coreg  3.125 mg twice daily and midodrine  2.5 mg 3 times daily.

## 2024-02-09 DIAGNOSIS — Z87440 Personal history of urinary (tract) infections: Secondary | ICD-10-CM | POA: Diagnosis not present

## 2024-02-09 DIAGNOSIS — D649 Anemia, unspecified: Secondary | ICD-10-CM | POA: Diagnosis not present

## 2024-02-09 DIAGNOSIS — I951 Orthostatic hypotension: Secondary | ICD-10-CM | POA: Diagnosis not present

## 2024-02-09 DIAGNOSIS — Z8673 Personal history of transient ischemic attack (TIA), and cerebral infarction without residual deficits: Secondary | ICD-10-CM | POA: Diagnosis not present

## 2024-02-09 DIAGNOSIS — E1159 Type 2 diabetes mellitus with other circulatory complications: Secondary | ICD-10-CM | POA: Diagnosis not present

## 2024-02-09 DIAGNOSIS — M1990 Unspecified osteoarthritis, unspecified site: Secondary | ICD-10-CM | POA: Diagnosis not present

## 2024-02-09 DIAGNOSIS — H9193 Unspecified hearing loss, bilateral: Secondary | ICD-10-CM | POA: Diagnosis not present

## 2024-02-09 DIAGNOSIS — H3530 Unspecified macular degeneration: Secondary | ICD-10-CM | POA: Diagnosis not present

## 2024-02-09 DIAGNOSIS — Z950 Presence of cardiac pacemaker: Secondary | ICD-10-CM | POA: Diagnosis not present

## 2024-02-09 DIAGNOSIS — Z974 Presence of external hearing-aid: Secondary | ICD-10-CM | POA: Diagnosis not present

## 2024-02-09 DIAGNOSIS — N400 Enlarged prostate without lower urinary tract symptoms: Secondary | ICD-10-CM | POA: Diagnosis not present

## 2024-02-09 DIAGNOSIS — E559 Vitamin D deficiency, unspecified: Secondary | ICD-10-CM | POA: Diagnosis not present

## 2024-02-09 DIAGNOSIS — E1165 Type 2 diabetes mellitus with hyperglycemia: Secondary | ICD-10-CM | POA: Diagnosis not present

## 2024-02-09 DIAGNOSIS — Z794 Long term (current) use of insulin: Secondary | ICD-10-CM | POA: Diagnosis not present

## 2024-02-09 DIAGNOSIS — H539 Unspecified visual disturbance: Secondary | ICD-10-CM | POA: Diagnosis not present

## 2024-02-09 DIAGNOSIS — E039 Hypothyroidism, unspecified: Secondary | ICD-10-CM | POA: Diagnosis not present

## 2024-02-09 DIAGNOSIS — Z86718 Personal history of other venous thrombosis and embolism: Secondary | ICD-10-CM | POA: Diagnosis not present

## 2024-02-09 DIAGNOSIS — Z7901 Long term (current) use of anticoagulants: Secondary | ICD-10-CM | POA: Diagnosis not present

## 2024-02-09 DIAGNOSIS — I152 Hypertension secondary to endocrine disorders: Secondary | ICD-10-CM | POA: Diagnosis not present

## 2024-02-09 DIAGNOSIS — G4700 Insomnia, unspecified: Secondary | ICD-10-CM | POA: Diagnosis not present

## 2024-02-09 DIAGNOSIS — Z955 Presence of coronary angioplasty implant and graft: Secondary | ICD-10-CM | POA: Diagnosis not present

## 2024-02-09 DIAGNOSIS — I4891 Unspecified atrial fibrillation: Secondary | ICD-10-CM | POA: Diagnosis not present

## 2024-02-09 DIAGNOSIS — I495 Sick sinus syndrome: Secondary | ICD-10-CM | POA: Diagnosis not present

## 2024-02-09 DIAGNOSIS — Z556 Problems related to health literacy: Secondary | ICD-10-CM | POA: Diagnosis not present

## 2024-02-09 DIAGNOSIS — I2510 Atherosclerotic heart disease of native coronary artery without angina pectoris: Secondary | ICD-10-CM | POA: Diagnosis not present

## 2024-02-10 DIAGNOSIS — H3530 Unspecified macular degeneration: Secondary | ICD-10-CM | POA: Diagnosis not present

## 2024-02-10 DIAGNOSIS — Z955 Presence of coronary angioplasty implant and graft: Secondary | ICD-10-CM | POA: Diagnosis not present

## 2024-02-10 DIAGNOSIS — D649 Anemia, unspecified: Secondary | ICD-10-CM | POA: Diagnosis not present

## 2024-02-10 DIAGNOSIS — I152 Hypertension secondary to endocrine disorders: Secondary | ICD-10-CM | POA: Diagnosis not present

## 2024-02-10 DIAGNOSIS — I495 Sick sinus syndrome: Secondary | ICD-10-CM | POA: Diagnosis not present

## 2024-02-10 DIAGNOSIS — E1165 Type 2 diabetes mellitus with hyperglycemia: Secondary | ICD-10-CM | POA: Diagnosis not present

## 2024-02-10 DIAGNOSIS — Z8673 Personal history of transient ischemic attack (TIA), and cerebral infarction without residual deficits: Secondary | ICD-10-CM | POA: Diagnosis not present

## 2024-02-10 DIAGNOSIS — G4700 Insomnia, unspecified: Secondary | ICD-10-CM | POA: Diagnosis not present

## 2024-02-10 DIAGNOSIS — Z794 Long term (current) use of insulin: Secondary | ICD-10-CM | POA: Diagnosis not present

## 2024-02-10 DIAGNOSIS — H9193 Unspecified hearing loss, bilateral: Secondary | ICD-10-CM | POA: Diagnosis not present

## 2024-02-10 DIAGNOSIS — E039 Hypothyroidism, unspecified: Secondary | ICD-10-CM | POA: Diagnosis not present

## 2024-02-10 DIAGNOSIS — Z7901 Long term (current) use of anticoagulants: Secondary | ICD-10-CM | POA: Diagnosis not present

## 2024-02-10 DIAGNOSIS — Z86718 Personal history of other venous thrombosis and embolism: Secondary | ICD-10-CM | POA: Diagnosis not present

## 2024-02-10 DIAGNOSIS — I2510 Atherosclerotic heart disease of native coronary artery without angina pectoris: Secondary | ICD-10-CM | POA: Diagnosis not present

## 2024-02-10 DIAGNOSIS — H539 Unspecified visual disturbance: Secondary | ICD-10-CM | POA: Diagnosis not present

## 2024-02-10 DIAGNOSIS — I4891 Unspecified atrial fibrillation: Secondary | ICD-10-CM | POA: Diagnosis not present

## 2024-02-10 DIAGNOSIS — N400 Enlarged prostate without lower urinary tract symptoms: Secondary | ICD-10-CM | POA: Diagnosis not present

## 2024-02-10 DIAGNOSIS — Z556 Problems related to health literacy: Secondary | ICD-10-CM | POA: Diagnosis not present

## 2024-02-10 DIAGNOSIS — M1990 Unspecified osteoarthritis, unspecified site: Secondary | ICD-10-CM | POA: Diagnosis not present

## 2024-02-10 DIAGNOSIS — Z87440 Personal history of urinary (tract) infections: Secondary | ICD-10-CM | POA: Diagnosis not present

## 2024-02-10 DIAGNOSIS — Z974 Presence of external hearing-aid: Secondary | ICD-10-CM | POA: Diagnosis not present

## 2024-02-10 DIAGNOSIS — E559 Vitamin D deficiency, unspecified: Secondary | ICD-10-CM | POA: Diagnosis not present

## 2024-02-10 DIAGNOSIS — I951 Orthostatic hypotension: Secondary | ICD-10-CM | POA: Diagnosis not present

## 2024-02-10 DIAGNOSIS — E1159 Type 2 diabetes mellitus with other circulatory complications: Secondary | ICD-10-CM | POA: Diagnosis not present

## 2024-02-10 DIAGNOSIS — Z950 Presence of cardiac pacemaker: Secondary | ICD-10-CM | POA: Diagnosis not present

## 2024-02-15 DIAGNOSIS — N400 Enlarged prostate without lower urinary tract symptoms: Secondary | ICD-10-CM | POA: Diagnosis not present

## 2024-02-15 DIAGNOSIS — E039 Hypothyroidism, unspecified: Secondary | ICD-10-CM | POA: Diagnosis not present

## 2024-02-15 DIAGNOSIS — Z8673 Personal history of transient ischemic attack (TIA), and cerebral infarction without residual deficits: Secondary | ICD-10-CM | POA: Diagnosis not present

## 2024-02-15 DIAGNOSIS — Z950 Presence of cardiac pacemaker: Secondary | ICD-10-CM | POA: Diagnosis not present

## 2024-02-15 DIAGNOSIS — M1990 Unspecified osteoarthritis, unspecified site: Secondary | ICD-10-CM | POA: Diagnosis not present

## 2024-02-15 DIAGNOSIS — E1165 Type 2 diabetes mellitus with hyperglycemia: Secondary | ICD-10-CM | POA: Diagnosis not present

## 2024-02-15 DIAGNOSIS — E1159 Type 2 diabetes mellitus with other circulatory complications: Secondary | ICD-10-CM | POA: Diagnosis not present

## 2024-02-15 DIAGNOSIS — I495 Sick sinus syndrome: Secondary | ICD-10-CM | POA: Diagnosis not present

## 2024-02-15 DIAGNOSIS — Z955 Presence of coronary angioplasty implant and graft: Secondary | ICD-10-CM | POA: Diagnosis not present

## 2024-02-15 DIAGNOSIS — D649 Anemia, unspecified: Secondary | ICD-10-CM | POA: Diagnosis not present

## 2024-02-15 DIAGNOSIS — H539 Unspecified visual disturbance: Secondary | ICD-10-CM | POA: Diagnosis not present

## 2024-02-15 DIAGNOSIS — I152 Hypertension secondary to endocrine disorders: Secondary | ICD-10-CM | POA: Diagnosis not present

## 2024-02-15 DIAGNOSIS — Z974 Presence of external hearing-aid: Secondary | ICD-10-CM | POA: Diagnosis not present

## 2024-02-15 DIAGNOSIS — E559 Vitamin D deficiency, unspecified: Secondary | ICD-10-CM | POA: Diagnosis not present

## 2024-02-15 DIAGNOSIS — Z556 Problems related to health literacy: Secondary | ICD-10-CM | POA: Diagnosis not present

## 2024-02-15 DIAGNOSIS — H3530 Unspecified macular degeneration: Secondary | ICD-10-CM | POA: Diagnosis not present

## 2024-02-15 DIAGNOSIS — I951 Orthostatic hypotension: Secondary | ICD-10-CM | POA: Diagnosis not present

## 2024-02-15 DIAGNOSIS — Z87440 Personal history of urinary (tract) infections: Secondary | ICD-10-CM | POA: Diagnosis not present

## 2024-02-15 DIAGNOSIS — G4700 Insomnia, unspecified: Secondary | ICD-10-CM | POA: Diagnosis not present

## 2024-02-15 DIAGNOSIS — Z86718 Personal history of other venous thrombosis and embolism: Secondary | ICD-10-CM | POA: Diagnosis not present

## 2024-02-15 DIAGNOSIS — Z794 Long term (current) use of insulin: Secondary | ICD-10-CM | POA: Diagnosis not present

## 2024-02-15 DIAGNOSIS — Z7901 Long term (current) use of anticoagulants: Secondary | ICD-10-CM | POA: Diagnosis not present

## 2024-02-15 DIAGNOSIS — H9193 Unspecified hearing loss, bilateral: Secondary | ICD-10-CM | POA: Diagnosis not present

## 2024-02-15 DIAGNOSIS — I2510 Atherosclerotic heart disease of native coronary artery without angina pectoris: Secondary | ICD-10-CM | POA: Diagnosis not present

## 2024-02-15 DIAGNOSIS — I4891 Unspecified atrial fibrillation: Secondary | ICD-10-CM | POA: Diagnosis not present

## 2024-02-20 DIAGNOSIS — I951 Orthostatic hypotension: Secondary | ICD-10-CM | POA: Diagnosis not present

## 2024-02-20 DIAGNOSIS — I152 Hypertension secondary to endocrine disorders: Secondary | ICD-10-CM | POA: Diagnosis not present

## 2024-02-20 DIAGNOSIS — H9193 Unspecified hearing loss, bilateral: Secondary | ICD-10-CM | POA: Diagnosis not present

## 2024-02-20 DIAGNOSIS — N400 Enlarged prostate without lower urinary tract symptoms: Secondary | ICD-10-CM | POA: Diagnosis not present

## 2024-02-20 DIAGNOSIS — D649 Anemia, unspecified: Secondary | ICD-10-CM | POA: Diagnosis not present

## 2024-02-20 DIAGNOSIS — M1990 Unspecified osteoarthritis, unspecified site: Secondary | ICD-10-CM | POA: Diagnosis not present

## 2024-02-20 DIAGNOSIS — Z974 Presence of external hearing-aid: Secondary | ICD-10-CM | POA: Diagnosis not present

## 2024-02-20 DIAGNOSIS — Z86718 Personal history of other venous thrombosis and embolism: Secondary | ICD-10-CM | POA: Diagnosis not present

## 2024-02-20 DIAGNOSIS — Z955 Presence of coronary angioplasty implant and graft: Secondary | ICD-10-CM | POA: Diagnosis not present

## 2024-02-20 DIAGNOSIS — I2510 Atherosclerotic heart disease of native coronary artery without angina pectoris: Secondary | ICD-10-CM | POA: Diagnosis not present

## 2024-02-20 DIAGNOSIS — Z7901 Long term (current) use of anticoagulants: Secondary | ICD-10-CM | POA: Diagnosis not present

## 2024-02-20 DIAGNOSIS — E039 Hypothyroidism, unspecified: Secondary | ICD-10-CM | POA: Diagnosis not present

## 2024-02-20 DIAGNOSIS — E559 Vitamin D deficiency, unspecified: Secondary | ICD-10-CM | POA: Diagnosis not present

## 2024-02-20 DIAGNOSIS — Z950 Presence of cardiac pacemaker: Secondary | ICD-10-CM | POA: Diagnosis not present

## 2024-02-20 DIAGNOSIS — Z794 Long term (current) use of insulin: Secondary | ICD-10-CM | POA: Diagnosis not present

## 2024-02-20 DIAGNOSIS — H539 Unspecified visual disturbance: Secondary | ICD-10-CM | POA: Diagnosis not present

## 2024-02-20 DIAGNOSIS — H3530 Unspecified macular degeneration: Secondary | ICD-10-CM | POA: Diagnosis not present

## 2024-02-20 DIAGNOSIS — I495 Sick sinus syndrome: Secondary | ICD-10-CM | POA: Diagnosis not present

## 2024-02-20 DIAGNOSIS — E1165 Type 2 diabetes mellitus with hyperglycemia: Secondary | ICD-10-CM | POA: Diagnosis not present

## 2024-02-20 DIAGNOSIS — E1159 Type 2 diabetes mellitus with other circulatory complications: Secondary | ICD-10-CM | POA: Diagnosis not present

## 2024-02-20 DIAGNOSIS — Z556 Problems related to health literacy: Secondary | ICD-10-CM | POA: Diagnosis not present

## 2024-02-20 DIAGNOSIS — Z8673 Personal history of transient ischemic attack (TIA), and cerebral infarction without residual deficits: Secondary | ICD-10-CM | POA: Diagnosis not present

## 2024-02-20 DIAGNOSIS — G4700 Insomnia, unspecified: Secondary | ICD-10-CM | POA: Diagnosis not present

## 2024-02-20 DIAGNOSIS — I4891 Unspecified atrial fibrillation: Secondary | ICD-10-CM | POA: Diagnosis not present

## 2024-02-20 DIAGNOSIS — Z87440 Personal history of urinary (tract) infections: Secondary | ICD-10-CM | POA: Diagnosis not present

## 2024-03-01 DIAGNOSIS — E039 Hypothyroidism, unspecified: Secondary | ICD-10-CM | POA: Diagnosis not present

## 2024-03-01 DIAGNOSIS — E1165 Type 2 diabetes mellitus with hyperglycemia: Secondary | ICD-10-CM | POA: Diagnosis not present

## 2024-03-01 DIAGNOSIS — H9193 Unspecified hearing loss, bilateral: Secondary | ICD-10-CM | POA: Diagnosis not present

## 2024-03-01 DIAGNOSIS — I495 Sick sinus syndrome: Secondary | ICD-10-CM | POA: Diagnosis not present

## 2024-03-01 DIAGNOSIS — E1159 Type 2 diabetes mellitus with other circulatory complications: Secondary | ICD-10-CM | POA: Diagnosis not present

## 2024-03-01 DIAGNOSIS — N400 Enlarged prostate without lower urinary tract symptoms: Secondary | ICD-10-CM | POA: Diagnosis not present

## 2024-03-01 DIAGNOSIS — H3530 Unspecified macular degeneration: Secondary | ICD-10-CM | POA: Diagnosis not present

## 2024-03-01 DIAGNOSIS — E559 Vitamin D deficiency, unspecified: Secondary | ICD-10-CM | POA: Diagnosis not present

## 2024-03-01 DIAGNOSIS — H539 Unspecified visual disturbance: Secondary | ICD-10-CM | POA: Diagnosis not present

## 2024-03-01 DIAGNOSIS — Z556 Problems related to health literacy: Secondary | ICD-10-CM | POA: Diagnosis not present

## 2024-03-01 DIAGNOSIS — I4891 Unspecified atrial fibrillation: Secondary | ICD-10-CM | POA: Diagnosis not present

## 2024-03-01 DIAGNOSIS — Z87440 Personal history of urinary (tract) infections: Secondary | ICD-10-CM | POA: Diagnosis not present

## 2024-03-01 DIAGNOSIS — Z950 Presence of cardiac pacemaker: Secondary | ICD-10-CM | POA: Diagnosis not present

## 2024-03-01 DIAGNOSIS — I152 Hypertension secondary to endocrine disorders: Secondary | ICD-10-CM | POA: Diagnosis not present

## 2024-03-01 DIAGNOSIS — Z8673 Personal history of transient ischemic attack (TIA), and cerebral infarction without residual deficits: Secondary | ICD-10-CM | POA: Diagnosis not present

## 2024-03-01 DIAGNOSIS — Z86718 Personal history of other venous thrombosis and embolism: Secondary | ICD-10-CM | POA: Diagnosis not present

## 2024-03-01 DIAGNOSIS — I951 Orthostatic hypotension: Secondary | ICD-10-CM | POA: Diagnosis not present

## 2024-03-01 DIAGNOSIS — D649 Anemia, unspecified: Secondary | ICD-10-CM | POA: Diagnosis not present

## 2024-03-01 DIAGNOSIS — I2510 Atherosclerotic heart disease of native coronary artery without angina pectoris: Secondary | ICD-10-CM | POA: Diagnosis not present

## 2024-03-01 DIAGNOSIS — G4700 Insomnia, unspecified: Secondary | ICD-10-CM | POA: Diagnosis not present

## 2024-03-01 DIAGNOSIS — M1990 Unspecified osteoarthritis, unspecified site: Secondary | ICD-10-CM | POA: Diagnosis not present

## 2024-03-01 DIAGNOSIS — Z7901 Long term (current) use of anticoagulants: Secondary | ICD-10-CM | POA: Diagnosis not present

## 2024-03-01 DIAGNOSIS — Z955 Presence of coronary angioplasty implant and graft: Secondary | ICD-10-CM | POA: Diagnosis not present

## 2024-03-01 DIAGNOSIS — Z794 Long term (current) use of insulin: Secondary | ICD-10-CM | POA: Diagnosis not present

## 2024-03-01 DIAGNOSIS — Z974 Presence of external hearing-aid: Secondary | ICD-10-CM | POA: Diagnosis not present

## 2024-03-18 ENCOUNTER — Other Ambulatory Visit: Payer: Self-pay | Admitting: Family Medicine

## 2024-03-20 ENCOUNTER — Emergency Department (HOSPITAL_COMMUNITY)

## 2024-03-20 ENCOUNTER — Inpatient Hospital Stay (HOSPITAL_COMMUNITY)
Admission: EM | Admit: 2024-03-20 | Discharge: 2024-03-26 | DRG: 064 | Disposition: A | Discharge status: Skilled Nursing Facility | Attending: Internal Medicine | Admitting: Internal Medicine

## 2024-03-20 DIAGNOSIS — Z794 Long term (current) use of insulin: Secondary | ICD-10-CM | POA: Diagnosis not present

## 2024-03-20 DIAGNOSIS — K5730 Diverticulosis of large intestine without perforation or abscess without bleeding: Secondary | ICD-10-CM | POA: Diagnosis present

## 2024-03-20 DIAGNOSIS — M6281 Muscle weakness (generalized): Secondary | ICD-10-CM | POA: Diagnosis not present

## 2024-03-20 DIAGNOSIS — Z7982 Long term (current) use of aspirin: Secondary | ICD-10-CM

## 2024-03-20 DIAGNOSIS — I63542 Cerebral infarction due to unspecified occlusion or stenosis of left cerebellar artery: Secondary | ICD-10-CM | POA: Diagnosis present

## 2024-03-20 DIAGNOSIS — Z66 Do not resuscitate: Secondary | ICD-10-CM

## 2024-03-20 DIAGNOSIS — I7090 Unspecified atherosclerosis: Secondary | ICD-10-CM | POA: Diagnosis not present

## 2024-03-20 DIAGNOSIS — Z7901 Long term (current) use of anticoagulants: Secondary | ICD-10-CM | POA: Diagnosis not present

## 2024-03-20 DIAGNOSIS — G4700 Insomnia, unspecified: Secondary | ICD-10-CM | POA: Diagnosis not present

## 2024-03-20 DIAGNOSIS — R404 Transient alteration of awareness: Secondary | ICD-10-CM | POA: Diagnosis not present

## 2024-03-20 DIAGNOSIS — E1165 Type 2 diabetes mellitus with hyperglycemia: Secondary | ICD-10-CM | POA: Diagnosis present

## 2024-03-20 DIAGNOSIS — I700 Atherosclerosis of aorta: Secondary | ICD-10-CM | POA: Diagnosis present

## 2024-03-20 DIAGNOSIS — Z955 Presence of coronary angioplasty implant and graft: Secondary | ICD-10-CM

## 2024-03-20 DIAGNOSIS — I959 Hypotension, unspecified: Secondary | ICD-10-CM | POA: Diagnosis not present

## 2024-03-20 DIAGNOSIS — Z79890 Hormone replacement therapy (postmenopausal): Secondary | ICD-10-CM | POA: Diagnosis not present

## 2024-03-20 DIAGNOSIS — I495 Sick sinus syndrome: Secondary | ICD-10-CM | POA: Diagnosis present

## 2024-03-20 DIAGNOSIS — Z86718 Personal history of other venous thrombosis and embolism: Secondary | ICD-10-CM

## 2024-03-20 DIAGNOSIS — I639 Cerebral infarction, unspecified: Secondary | ICD-10-CM | POA: Diagnosis not present

## 2024-03-20 DIAGNOSIS — R410 Disorientation, unspecified: Secondary | ICD-10-CM | POA: Diagnosis not present

## 2024-03-20 DIAGNOSIS — R1310 Dysphagia, unspecified: Secondary | ICD-10-CM | POA: Diagnosis present

## 2024-03-20 DIAGNOSIS — Z86711 Personal history of pulmonary embolism: Secondary | ICD-10-CM

## 2024-03-20 DIAGNOSIS — N400 Enlarged prostate without lower urinary tract symptoms: Secondary | ICD-10-CM | POA: Diagnosis not present

## 2024-03-20 DIAGNOSIS — I6389 Other cerebral infarction: Secondary | ICD-10-CM | POA: Diagnosis not present

## 2024-03-20 DIAGNOSIS — H3530 Unspecified macular degeneration: Secondary | ICD-10-CM | POA: Diagnosis not present

## 2024-03-20 DIAGNOSIS — R339 Retention of urine, unspecified: Secondary | ICD-10-CM | POA: Diagnosis not present

## 2024-03-20 DIAGNOSIS — I951 Orthostatic hypotension: Secondary | ICD-10-CM

## 2024-03-20 DIAGNOSIS — R29703 NIHSS score 3: Secondary | ICD-10-CM | POA: Diagnosis not present

## 2024-03-20 DIAGNOSIS — Z87891 Personal history of nicotine dependence: Secondary | ICD-10-CM

## 2024-03-20 DIAGNOSIS — R4182 Altered mental status, unspecified: Principal | ICD-10-CM | POA: Diagnosis present

## 2024-03-20 DIAGNOSIS — Z950 Presence of cardiac pacemaker: Secondary | ICD-10-CM | POA: Diagnosis present

## 2024-03-20 DIAGNOSIS — N1832 Chronic kidney disease, stage 3b: Secondary | ICD-10-CM | POA: Diagnosis present

## 2024-03-20 DIAGNOSIS — I2510 Atherosclerotic heart disease of native coronary artery without angina pectoris: Secondary | ICD-10-CM | POA: Diagnosis present

## 2024-03-20 DIAGNOSIS — D649 Anemia, unspecified: Secondary | ICD-10-CM | POA: Diagnosis not present

## 2024-03-20 DIAGNOSIS — I7121 Aneurysm of the ascending aorta, without rupture: Secondary | ICD-10-CM | POA: Diagnosis not present

## 2024-03-20 DIAGNOSIS — E039 Hypothyroidism, unspecified: Secondary | ICD-10-CM | POA: Diagnosis present

## 2024-03-20 DIAGNOSIS — R1013 Epigastric pain: Secondary | ICD-10-CM | POA: Diagnosis not present

## 2024-03-20 DIAGNOSIS — Z8249 Family history of ischemic heart disease and other diseases of the circulatory system: Secondary | ICD-10-CM | POA: Diagnosis not present

## 2024-03-20 DIAGNOSIS — H9190 Unspecified hearing loss, unspecified ear: Secondary | ICD-10-CM | POA: Diagnosis not present

## 2024-03-20 DIAGNOSIS — R29705 NIHSS score 5: Secondary | ICD-10-CM | POA: Diagnosis present

## 2024-03-20 DIAGNOSIS — I498 Other specified cardiac arrhythmias: Secondary | ICD-10-CM

## 2024-03-20 DIAGNOSIS — M4802 Spinal stenosis, cervical region: Secondary | ICD-10-CM | POA: Diagnosis not present

## 2024-03-20 DIAGNOSIS — M47812 Spondylosis without myelopathy or radiculopathy, cervical region: Secondary | ICD-10-CM | POA: Diagnosis not present

## 2024-03-20 DIAGNOSIS — Z823 Family history of stroke: Secondary | ICD-10-CM

## 2024-03-20 DIAGNOSIS — I672 Cerebral atherosclerosis: Secondary | ICD-10-CM | POA: Diagnosis not present

## 2024-03-20 DIAGNOSIS — G4720 Circadian rhythm sleep disorder, unspecified type: Secondary | ICD-10-CM | POA: Diagnosis present

## 2024-03-20 DIAGNOSIS — I25118 Atherosclerotic heart disease of native coronary artery with other forms of angina pectoris: Secondary | ICD-10-CM

## 2024-03-20 DIAGNOSIS — I6350 Cerebral infarction due to unspecified occlusion or stenosis of unspecified cerebral artery: Secondary | ICD-10-CM | POA: Diagnosis not present

## 2024-03-20 DIAGNOSIS — R5383 Other fatigue: Secondary | ICD-10-CM

## 2024-03-20 DIAGNOSIS — E785 Hyperlipidemia, unspecified: Secondary | ICD-10-CM | POA: Diagnosis present

## 2024-03-20 DIAGNOSIS — E1151 Type 2 diabetes mellitus with diabetic peripheral angiopathy without gangrene: Secondary | ICD-10-CM | POA: Diagnosis not present

## 2024-03-20 DIAGNOSIS — R29706 NIHSS score 6: Secondary | ICD-10-CM | POA: Diagnosis not present

## 2024-03-20 DIAGNOSIS — E890 Postprocedural hypothyroidism: Secondary | ICD-10-CM | POA: Diagnosis present

## 2024-03-20 DIAGNOSIS — S199XXA Unspecified injury of neck, initial encounter: Secondary | ICD-10-CM | POA: Diagnosis not present

## 2024-03-20 DIAGNOSIS — R457 State of emotional shock and stress, unspecified: Secondary | ICD-10-CM | POA: Diagnosis not present

## 2024-03-20 DIAGNOSIS — I252 Old myocardial infarction: Secondary | ICD-10-CM

## 2024-03-20 DIAGNOSIS — I480 Paroxysmal atrial fibrillation: Secondary | ICD-10-CM | POA: Diagnosis present

## 2024-03-20 DIAGNOSIS — G9341 Metabolic encephalopathy: Secondary | ICD-10-CM | POA: Diagnosis present

## 2024-03-20 DIAGNOSIS — I10 Essential (primary) hypertension: Secondary | ICD-10-CM | POA: Diagnosis not present

## 2024-03-20 DIAGNOSIS — I63522 Cerebral infarction due to unspecified occlusion or stenosis of left anterior cerebral artery: Secondary | ICD-10-CM | POA: Diagnosis not present

## 2024-03-20 DIAGNOSIS — E1122 Type 2 diabetes mellitus with diabetic chronic kidney disease: Secondary | ICD-10-CM | POA: Diagnosis present

## 2024-03-20 DIAGNOSIS — Z951 Presence of aortocoronary bypass graft: Secondary | ICD-10-CM | POA: Diagnosis not present

## 2024-03-20 DIAGNOSIS — E1159 Type 2 diabetes mellitus with other circulatory complications: Secondary | ICD-10-CM | POA: Diagnosis present

## 2024-03-20 DIAGNOSIS — Z79899 Other long term (current) drug therapy: Secondary | ICD-10-CM | POA: Diagnosis not present

## 2024-03-20 DIAGNOSIS — E559 Vitamin D deficiency, unspecified: Secondary | ICD-10-CM | POA: Diagnosis not present

## 2024-03-20 DIAGNOSIS — Z8673 Personal history of transient ischemic attack (TIA), and cerebral infarction without residual deficits: Secondary | ICD-10-CM

## 2024-03-20 DIAGNOSIS — Z96659 Presence of unspecified artificial knee joint: Secondary | ICD-10-CM | POA: Diagnosis present

## 2024-03-20 DIAGNOSIS — Z751 Person awaiting admission to adequate facility elsewhere: Secondary | ICD-10-CM

## 2024-03-20 DIAGNOSIS — I69391 Dysphagia following cerebral infarction: Secondary | ICD-10-CM | POA: Diagnosis not present

## 2024-03-20 DIAGNOSIS — I6501 Occlusion and stenosis of right vertebral artery: Secondary | ICD-10-CM | POA: Diagnosis not present

## 2024-03-20 DIAGNOSIS — R9389 Abnormal findings on diagnostic imaging of other specified body structures: Secondary | ICD-10-CM | POA: Diagnosis not present

## 2024-03-20 DIAGNOSIS — I152 Hypertension secondary to endocrine disorders: Secondary | ICD-10-CM | POA: Diagnosis present

## 2024-03-20 DIAGNOSIS — I6523 Occlusion and stenosis of bilateral carotid arteries: Secondary | ICD-10-CM | POA: Diagnosis not present

## 2024-03-20 DIAGNOSIS — I6381 Other cerebral infarction due to occlusion or stenosis of small artery: Secondary | ICD-10-CM | POA: Diagnosis not present

## 2024-03-20 DIAGNOSIS — I6340 Cerebral infarction due to embolism of unspecified cerebral artery: Secondary | ICD-10-CM | POA: Diagnosis not present

## 2024-03-20 DIAGNOSIS — I651 Occlusion and stenosis of basilar artery: Secondary | ICD-10-CM | POA: Diagnosis not present

## 2024-03-20 DIAGNOSIS — R29818 Other symptoms and signs involving the nervous system: Secondary | ICD-10-CM | POA: Diagnosis not present

## 2024-03-20 DIAGNOSIS — M1990 Unspecified osteoarthritis, unspecified site: Secondary | ICD-10-CM | POA: Diagnosis not present

## 2024-03-20 DIAGNOSIS — K8020 Calculus of gallbladder without cholecystitis without obstruction: Secondary | ICD-10-CM | POA: Diagnosis not present

## 2024-03-20 LAB — I-STAT CHEM 8, ED
BUN: 25 mg/dL — ABNORMAL HIGH (ref 8–23)
Calcium, Ion: 1.12 mmol/L — ABNORMAL LOW (ref 1.15–1.40)
Chloride: 107 mmol/L (ref 98–111)
Creatinine, Ser: 1.3 mg/dL — ABNORMAL HIGH (ref 0.61–1.24)
Glucose, Bld: 138 mg/dL — ABNORMAL HIGH (ref 70–99)
HCT: 31 % — ABNORMAL LOW (ref 39.0–52.0)
Hemoglobin: 10.5 g/dL — ABNORMAL LOW (ref 13.0–17.0)
Potassium: 4.6 mmol/L (ref 3.5–5.1)
Sodium: 140 mmol/L (ref 135–145)
TCO2: 25 mmol/L (ref 22–32)

## 2024-03-20 LAB — CBC WITH DIFFERENTIAL/PLATELET
Abs Immature Granulocytes: 0.03 K/uL (ref 0.00–0.07)
Basophils Absolute: 0 K/uL (ref 0.0–0.1)
Basophils Relative: 0 %
Eosinophils Absolute: 0.1 K/uL (ref 0.0–0.5)
Eosinophils Relative: 1 %
HCT: 34.6 % — ABNORMAL LOW (ref 39.0–52.0)
Hemoglobin: 11.1 g/dL — ABNORMAL LOW (ref 13.0–17.0)
Immature Granulocytes: 0 %
Lymphocytes Relative: 31 %
Lymphs Abs: 3.4 K/uL (ref 0.7–4.0)
MCH: 33.1 pg (ref 26.0–34.0)
MCHC: 32.1 g/dL (ref 30.0–36.0)
MCV: 103.3 fL — ABNORMAL HIGH (ref 80.0–100.0)
Monocytes Absolute: 0.8 K/uL (ref 0.1–1.0)
Monocytes Relative: 7 %
Neutro Abs: 6.6 K/uL (ref 1.7–7.7)
Neutrophils Relative %: 61 %
Platelets: 152 K/uL (ref 150–400)
RBC: 3.35 MIL/uL — ABNORMAL LOW (ref 4.22–5.81)
RDW: 12.8 % (ref 11.5–15.5)
WBC: 11 K/uL — ABNORMAL HIGH (ref 4.0–10.5)
nRBC: 0 % (ref 0.0–0.2)

## 2024-03-20 LAB — URINALYSIS, ROUTINE W REFLEX MICROSCOPIC
Bilirubin Urine: NEGATIVE
Glucose, UA: 150 mg/dL — AB
Hgb urine dipstick: NEGATIVE
Ketones, ur: NEGATIVE mg/dL
Leukocytes,Ua: NEGATIVE
Nitrite: NEGATIVE
Protein, ur: NEGATIVE mg/dL
Specific Gravity, Urine: 1.015 (ref 1.005–1.030)
pH: 5 (ref 5.0–8.0)

## 2024-03-20 LAB — I-STAT VENOUS BLOOD GAS, ED
Acid-Base Excess: 2 mmol/L (ref 0.0–2.0)
Bicarbonate: 26.1 mmol/L (ref 20.0–28.0)
Calcium, Ion: 1.15 mmol/L (ref 1.15–1.40)
HCT: 31 % — ABNORMAL LOW (ref 39.0–52.0)
Hemoglobin: 10.5 g/dL — ABNORMAL LOW (ref 13.0–17.0)
O2 Saturation: 96 %
Potassium: 4.6 mmol/L (ref 3.5–5.1)
Sodium: 140 mmol/L (ref 135–145)
TCO2: 27 mmol/L (ref 22–32)
pCO2, Ven: 39.9 mmHg — ABNORMAL LOW (ref 44–60)
pH, Ven: 7.423 (ref 7.25–7.43)
pO2, Ven: 81 mmHg — ABNORMAL HIGH (ref 32–45)

## 2024-03-20 LAB — COMPREHENSIVE METABOLIC PANEL WITH GFR
ALT: 13 U/L (ref 0–44)
AST: 19 U/L (ref 15–41)
Albumin: 3.3 g/dL — ABNORMAL LOW (ref 3.5–5.0)
Alkaline Phosphatase: 47 U/L (ref 38–126)
Anion gap: 9 (ref 5–15)
BUN: 18 mg/dL (ref 8–23)
CO2: 23 mmol/L (ref 22–32)
Calcium: 8.4 mg/dL — ABNORMAL LOW (ref 8.9–10.3)
Chloride: 105 mmol/L (ref 98–111)
Creatinine, Ser: 1.12 mg/dL (ref 0.61–1.24)
GFR, Estimated: >60 mL/min (ref >60)
Glucose, Bld: 136 mg/dL — ABNORMAL HIGH (ref 70–99)
Potassium: 4.5 mmol/L (ref 3.5–5.1)
Sodium: 137 mmol/L (ref 135–145)
Total Bilirubin: 0.3 mg/dL (ref 0.0–1.2)
Total Protein: 6.5 g/dL (ref 6.5–8.1)

## 2024-03-20 LAB — AMMONIA: Ammonia: 18 umol/L (ref 9–35)

## 2024-03-20 MED ORDER — LACTATED RINGERS IV BOLUS
1000.0000 mL | Freq: Once | INTRAVENOUS | Status: AC
  Administered 2024-03-20: 1000 mL via INTRAVENOUS

## 2024-03-20 MED ORDER — IOHEXOL 350 MG/ML SOLN
80.0000 mL | Freq: Once | INTRAVENOUS | Status: AC | PRN
  Administered 2024-03-20: 80 mL via INTRAVENOUS

## 2024-03-20 NOTE — ED Provider Triage Note (Signed)
 Emergency Medicine Provider Triage Evaluation Note  Thomas [name] , a 89 y.o. male  was evaluated in triage.  Pt complains of AMS since this AM with waking up. Wife reports he has been very somnolent. EMS came out twice. Reports LKW was last night at 2130. Normal day yesterday. No new medication changes or recent illness.  Review of Systems  Positive:  Negative:   Physical Exam  BP (!) 128/55 (BP Location: Left Arm)   Pulse 72   Temp (!) 97.5 F (36.4 C) (Axillary)   Resp (!) 24   SpO2 99%  Gen:   Somnolent, but awakens to voice.  Resp:  Normal effort  MSK:   Moves extremities without difficulty  Other:  No facial droop noted. Patient is HOH and difficult to obtain neuro exam because of this. He is oriented to person, place, month, but not year.   Medical Decision Making  Medically screening exam initiated at 2:50 PM.  Appropriate orders placed.  Thomas [name] was informed that the remainder of the evaluation will be completed by another provider, this initial triage assessment does not replace that evaluation, and the importance of remaining in the ED until their evaluation is complete.  CT Head and labs ordered.    Thomas Ernst, PA-C 03/20/24 1452

## 2024-03-20 NOTE — ED Triage Notes (Signed)
 Patient with altered mental status since yesterday. Wife also reports urinary retention. Normally A/O x 4 but today GCS 13.

## 2024-03-20 NOTE — ED Provider Notes (Signed)
 [place] EMERGENCY DEPARTMENT AT [facility] Provider Note   CSN: [id number] Arrival date & time: 03/20/24  1335     Patient presents with: Altered Mental Status   Thomas [name] is a 89 y.o. male.  {Add pertinent medical, surgical, social history, OB history to YEP:67052}  Altered Mental Status  Patient is a 89 year old male with a past medical history significant for strokes, hearing loss, heart attacks, hypothyroidism, diabetes  Patient presents emergency room today with complaints of generalized weakness per family members at bedside patient is normally slow with walking but is able to ambulate at baseline with walker.  Today he was too weak to get out of bed he has not had any focal weakness but has been generally weak per his account.  His family members also state that he has had somewhat waxing and waning confusion regarding where he is and has at times answer differently including saying that he was in [place].  He seems to be having some confusion about the date as well.  No fevers at home.  Has had some difficulty urinating and required In-N-Out catheterization here in the emergency department however that he denies any burning with urination or increased frequency.  He denies any pain in his chest abdomen or head.       Prior to Admission medications   Medication Sig Start Date End Date Taking? Authorizing Provider  acetaminophen  (TYLENOL ) 325 MG tablet Take 650 mg by mouth every 6 (six) hours as needed for moderate pain or headache.    [provider]  amiodarone  (PACERONE ) 200 MG tablet Take 1/2 tablet (100mg ) by mouth once daily 09/26/23   Nahser, Aleene PARAS, MD  carvedilol  (COREG ) 3.125 MG tablet Take 1 tablet (3.125 mg total) by mouth 2 (two) times daily as needed (take only if Systolic Blood Pressure greater than 140). 04/07/22   Hobart Powell BRAVO, MD  cholecalciferol  (VITAMIN D3) 25 MCG (1000 UNIT) tablet Take 1,000 Units by mouth daily.     [provider]  co-enzyme Q-10 50 MG capsule Take 50 mg by mouth daily.    [provider]  ELIQUIS  5 MG TABS tablet TAKE 1 TABLET(5 MG) BY MOUTH TWICE DAILY 12/26/23   Nahser, Aleene PARAS, MD  furosemide  (LASIX ) 20 MG tablet Take 1 tablet (20 mg total) by mouth daily. 11/18/23   Kennyth Worth HERO, MD  gabapentin  (NEURONTIN ) 100 MG capsule TAKE 2 CAPSULES(200 MG) BY MOUTH AT BEDTIME 09/26/23   Parker, Caleb M, MD  glucosamine-chondroitin 500-400 MG tablet Take 1 tablet by mouth 3 (three) times daily.    [provider]  Insulin  Pen Needle (B-D UF III MINI PEN NEEDLES) 31G X 5 MM MISC Use daily with Lantus  pen 10/12/23   Kennyth Worth HERO, MD  ketoconazole  (NIZORAL ) 2 % cream Apply 1 Application topically 2 (two) times daily. 02/06/24   Kennyth Worth HERO, MD  LANTUS  SOLOSTAR 100 UNIT/ML Solostar Pen ADMINISTER 13 UNITS UNDER THE SKIN DAILY 02/08/23   Kennyth Worth HERO, MD  levothyroxine  (SYNTHROID ) 112 MCG tablet TAKE 1 TABLET(112 MCG) BY MOUTH DAILY BEFORE BREAKFAST 03/19/24   Kennyth Worth HERO, MD  midodrine  (PROAMATINE ) 2.5 MG tablet Take 1 tablet (2.5 mg total) by mouth 3 (three) times daily with meals. 09/26/23   Nahser, Aleene PARAS, MD  mirtazapine  (REMERON  SOL-TAB) 15 MG disintegrating tablet Take 1 tablet (15 mg total) by mouth at bedtime. 01/31/23   Kennyth Worth HERO, MD  Multiple Vitamin (MULTIVITAMIN) tablet Take 1 tablet  by mouth daily.    [provider]  Red Yeast Rice Extract (RED YEAST RICE PO) Take 1 capsule by mouth in the morning and at bedtime.    [provider]  rosuvastatin  (CRESTOR ) 20 MG tablet Take 1 tablet (20 mg total) by mouth at bedtime. 09/26/23   Nahser, Aleene PARAS, MD  tamsulosin  (FLOMAX ) 0.4 MG CAPS capsule Take 2 capsules (0.8 mg total) by mouth daily after supper. 08/04/23   Kennyth Worth HERO, MD    Allergies: Amitriptyline, Lyrica [pregabalin], Pravachol [pravastatin], Effexor xr [venlafaxine], Morphine, and Zolpidem    Review of Systems  Updated  Vital Signs BP (!) 144/75   Pulse 72   Temp (!) 96.6 F (35.9 C) (Axillary)   Resp 14   SpO2 100%   Physical Exam Vitals and nursing note reviewed.  Constitutional:      General: He is not in acute distress.    Comments: Pleasant hard of hearing 89 year old male in no acute distress  HENT:     Head: Normocephalic and atraumatic.     Nose: Nose normal.     Mouth/Throat:     Mouth: Mucous membranes are moist.  Eyes:     General: No scleral icterus. Cardiovascular:     Rate and Rhythm: Normal rate and regular rhythm.     Pulses: Normal pulses.     Heart sounds: Normal heart sounds.  Pulmonary:     Effort: Pulmonary effort is normal. No respiratory distress.     Breath sounds: No wheezing.  Abdominal:     Palpations: Abdomen is soft.     Tenderness: There is no abdominal tenderness. There is no guarding or rebound.  Musculoskeletal:     Cervical back: Normal range of motion.     Right lower leg: No edema.     Left lower leg: No edema.  Skin:    General: Skin is warm and dry.     Capillary Refill: Capillary refill takes less than 2 seconds.  Neurological:     Mental Status: He is alert.     Comments: Oriented to self incorrect date and location  Moves all 4 extremities, diffusely weak, smile symmetric, sensation normal throughout  Psychiatric:        Mood and Affect: Mood normal.        Behavior: Behavior normal.     (all labs ordered are listed, but only abnormal results are displayed) Labs Reviewed  CBC WITH DIFFERENTIAL/PLATELET - Abnormal; Notable for the following components:      Result Value   WBC 11.0 (*)    RBC 3.35 (*)    Hemoglobin 11.1 (*)    HCT 34.6 (*)    MCV 103.3 (*)    All other components within normal limits  COMPREHENSIVE METABOLIC PANEL WITH GFR - Abnormal; Notable for the following components:   Glucose, Bld 136 (*)    Calcium  8.4 (*)    Albumin 3.3 (*)    All other components within normal limits  URINALYSIS, ROUTINE W REFLEX  MICROSCOPIC - Abnormal; Notable for the following components:   Glucose, UA 150 (*)    All other components within normal limits  I-STAT CHEM 8, ED - Abnormal; Notable for the following components:   BUN 25 (*)    Creatinine, Ser 1.30 (*)    Glucose, Bld 138 (*)    Calcium , Ion 1.12 (*)    Hemoglobin 10.5 (*)    HCT 31.0 (*)    All other components within  normal limits  I-STAT VENOUS BLOOD GAS, ED - Abnormal; Notable for the following components:   pCO2, Ven 39.9 (*)    pO2, Ven 81 (*)    HCT 31.0 (*)    Hemoglobin 10.5 (*)    All other components within normal limits  URINE CULTURE  AMMONIA    EKG: EKG Interpretation Date/Time:  Tuesday March 20 2024 13:50:30 EDT Ventricular Rate:  70 PR Interval:    QRS Duration:  96 QT Interval:  431 QTC Calculation: 466 R Axis:   54  Text Interpretation: Atrial paced rhythm with prolonged AV conduction Low voltage, precordial leads Nonspecific T abnormalities, lateral leads Similar to priors Confirmed by Franklyn Gills [phone]) on 03/20/2024 6:06:21 PM  Radiology: CT Head Wo Contrast Result Date: 03/20/2024 CLINICAL DATA:  Altered mental status EXAM: CT HEAD WITHOUT CONTRAST TECHNIQUE: Contiguous axial images were obtained from the base of the skull through the vertex without intravenous contrast. RADIATION DOSE REDUCTION: This exam was performed according to the departmental dose-optimization program which includes automated exposure control, adjustment of the mA and/or kV according to patient size and/or use of iterative reconstruction technique. COMPARISON:  MRI September 01, 2020 FINDINGS: CT HEAD: There is extensive chronic low attenuation in the cerebral white matter. Old right occipital infarct. There is no hemorrhage. No acute ischemic changes. No mass lesion. The ventricles are normal. Skull/sinuses/orbits: No significant abnormality. IMPRESSION: Extensive chronic white matter disease. Old right occipital infarct. No acute abnormality.  Electronically Signed   By: Nancyann Burns M.D.   On: 03/20/2024 16:13   CT Cervical Spine Wo Contrast Result Date: 03/20/2024 CLINICAL DATA:  Neck trauma. Altered mental status since yesterday. Urinary retention. EXAM: CT CERVICAL SPINE WITHOUT CONTRAST TECHNIQUE: Multidetector CT imaging of the cervical spine was performed without intravenous contrast. Multiplanar CT image reconstructions were also generated. RADIATION DOSE REDUCTION: This exam was performed according to the departmental dose-optimization program which includes automated exposure control, adjustment of the mA and/or kV according to patient size and/or use of iterative reconstruction technique. COMPARISON:  CT cervical spine 10/10/2021. FINDINGS: Alignment: Exaggerated thoracic kyphosis with similar mild stepwise degenerative anterolisthesis at C4-5, C5-6, C6-7 and C7-T1. Skull base and vertebrae: No evidence of acute cervical spine fracture or traumatic subluxation. Postsurgical changes from C3-4 ACDF. There is multilevel spondylosis with partially bridging anterior osteophytes from C5 through T1. Multilevel facet arthropathy. Soft tissues and spinal canal: No prevertebral fluid or swelling. No visible canal hematoma. Disc levels: Multilevel spondylosis with uncinate spurring and facet hypertrophy. There are advanced degenerative changes at the lateral C1-2 articulations, greater on the left. There is multilevel mild-to-moderate foraminal narrowing which appears chronic. Upper chest: Clear lung apices. Left subclavian pacemaker leads noted. Other: None. IMPRESSION: 1. No evidence of acute cervical spine fracture, traumatic subluxation or static signs of instability. 2. Multilevel cervical spondylosis as described. Electronically Signed   By: Elsie Perone M.D.   On: 03/20/2024 16:05   DG Chest Portable 1 View Result Date: 03/20/2024 CLINICAL DATA:  Altered mental status. EXAM: PORTABLE CHEST 1 VIEW COMPARISON:  10/10/2021. FINDINGS: Low lung  volume. Mildly elevated right hemidiaphragm. Bilateral lung fields are clear. Bilateral costophrenic angles are clear. Stable cardio-mediastinal silhouette. There is a left sided 2-lead pacemaker. There are surgical staples along the heart border and sternotomy wires, status post CABG (coronary artery bypass graft). No acute osseous abnormalities. Multiple old healed left rib fractures noted. The soft tissues are within normal limits. IMPRESSION: No active disease. Electronically Signed   By:  Ree Molt M.D.   On: 03/20/2024 15:27    {Document cardiac monitor, telemetry assessment procedure when appropriate:32947} Procedures   Medications Ordered in the ED  lactated ringers  bolus 1,000 mL (has no administration in time range)      {Click here for ABCD2, HEART and other calculators REFRESH Note before signing:1}                              Medical Decision Making Amount and/or Complexity of Data Reviewed Radiology: ordered.  Risk Prescription drug management. Decision regarding hospitalization.   This patient presents to the ED for concern of AMS, this involves a number of treatment options, and is a complaint that carries with it a high risk of complications and morbidity. A differential diagnosis was considered for the patient's symptoms which is discussed below:   The differential diagnosis for AMS is extensive and includes, but is not limited to: drug overdose - opioids, alcohol, sedatives, antipsychotics, drug withdrawal, others; Metabolic: hypoxia, hypoglycemia, hyperglycemia, hypercalcemia, hypernatremia, hyponatremia, uremia, hepatic encephalopathy, hypothyroidism, hyperthyroidism, vitamin B12 or thiamine deficiency, carbon monoxide poisoning, Wilson's disease, Lactic acidosis, ***DKA/HHOS; Infectious: meningitis, encephalitis, bacteremia/sepsis, urinary tract infection, pneumonia, neurosyphilis; Structural: Space-occupying lesion, (brain tumor, subdural hematoma, hydrocephalus,);  Vascular: stroke, subarachnoid hemorrhage, coronary ischemia, hypertensive encephalopathy, CNS vasculitis, thrombotic thrombocytopenic purpura, disseminated intravascular coagulation, hyperviscosity; Psychiatric: Schizophrenia, depression; Other: Seizure, hypothermia, heat stroke, dementia    Co morbidities: Discussed in HPI   Brief History:  Patient is a 89 year old male with a past medical history significant for strokes, hearing loss, heart attacks, hypothyroidism, diabetes  Patient presents emergency room today with complaints of generalized weakness per family members at bedside patient is normally slow with walking but is able to ambulate at baseline with walker.  Today he was too weak to get out of bed he has not had any focal weakness but has been generally weak per his account.  His family members also state that he has had somewhat waxing and waning confusion regarding where he is and has at times answer differently including saying that he was in [place].  He seems to be having some confusion about the date as well.  No fevers at home.  Has had some difficulty urinating and required In-N-Out catheterization here in the emergency department however that he denies any burning with urination or increased frequency.  He denies any pain in his chest abdomen or head.    EMR reviewed including pt PMHx, past surgical history and past visits to ER.   See HPI for more details   Lab Tests:   {Blank single:19197::I ordered and independently interpreted labs. Labs notable for,I personally reviewed all laboratory work and imaging. Metabolic panel without any acute abnormality specifically kidney function within normal limits and no significant electrolyte abnormalities. CBC without leukocytosis or significant anemia.}   Imaging Studies:  {Blank single:19197::NAD. I personally reviewed all imaging studies and no acute abnormality found. I agree with radiology interpretation.,Abnormal  findings. I personally reviewed all imaging studies. Imaging notable for,No imaging studies ordered for this patient}    Cardiac Monitoring:  {Blank single:19197::The patient was maintained on a cardiac monitor.  I personally viewed and interpreted the cardiac monitored which showed an underlying rhythm of:,NA} {Blank single:19197::EKG non-ischemic,NA}   Medicines ordered:  I ordered medication including ***  for *** Reevaluation of the patient after these medicines showed that the patient {resolved/improved/worsened:23923::improved} I have reviewed the patients home medicines and have made  adjustments as needed   Critical Interventions:  ***   Consults/Attending Physician   {Blank single:19197::I requested consultation with ***,  and discussed lab and imaging findings as well as pertinent plan - they recommend: ***,I discussed this case with my attending physician who cosigned this note including patient's presenting symptoms, physical exam, and planned diagnostics and interventions. Attending physician stated agreement with plan or made changes to plan which were implemented.}   Reevaluation:  After the interventions noted above I re-evaluated patient and found that they have :{resolved/improved/worsened:23923::improved}   Social Determinants of Health:  {Blank single:19197::Given cab voucher,Social work/case management involved,The patient's social determinants of health were a factor in the care of this patient}    Problem List / ED Course:  ***   Dispostion:  After consideration of the diagnostic results and the patients response to treatment, I feel that the patent would benefit from ***       Final diagnoses:  None    ED Discharge Orders     None

## 2024-03-21 ENCOUNTER — Encounter (HOSPITAL_COMMUNITY): Payer: Self-pay | Admitting: Internal Medicine

## 2024-03-21 ENCOUNTER — Other Ambulatory Visit: Payer: Self-pay

## 2024-03-21 ENCOUNTER — Observation Stay (HOSPITAL_COMMUNITY)

## 2024-03-21 DIAGNOSIS — I6350 Cerebral infarction due to unspecified occlusion or stenosis of unspecified cerebral artery: Secondary | ICD-10-CM | POA: Diagnosis not present

## 2024-03-21 DIAGNOSIS — I6501 Occlusion and stenosis of right vertebral artery: Secondary | ICD-10-CM | POA: Diagnosis not present

## 2024-03-21 DIAGNOSIS — Z7901 Long term (current) use of anticoagulants: Secondary | ICD-10-CM | POA: Diagnosis not present

## 2024-03-21 DIAGNOSIS — Z66 Do not resuscitate: Secondary | ICD-10-CM

## 2024-03-21 DIAGNOSIS — I480 Paroxysmal atrial fibrillation: Secondary | ICD-10-CM

## 2024-03-21 DIAGNOSIS — I6523 Occlusion and stenosis of bilateral carotid arteries: Secondary | ICD-10-CM | POA: Diagnosis not present

## 2024-03-21 DIAGNOSIS — R4182 Altered mental status, unspecified: Secondary | ICD-10-CM

## 2024-03-21 DIAGNOSIS — I6340 Cerebral infarction due to embolism of unspecified cerebral artery: Secondary | ICD-10-CM

## 2024-03-21 DIAGNOSIS — R29818 Other symptoms and signs involving the nervous system: Secondary | ICD-10-CM | POA: Diagnosis not present

## 2024-03-21 DIAGNOSIS — I63522 Cerebral infarction due to unspecified occlusion or stenosis of left anterior cerebral artery: Secondary | ICD-10-CM | POA: Diagnosis not present

## 2024-03-21 DIAGNOSIS — R29706 NIHSS score 6: Secondary | ICD-10-CM

## 2024-03-21 DIAGNOSIS — I6381 Other cerebral infarction due to occlusion or stenosis of small artery: Secondary | ICD-10-CM | POA: Diagnosis not present

## 2024-03-21 DIAGNOSIS — I651 Occlusion and stenosis of basilar artery: Secondary | ICD-10-CM | POA: Diagnosis not present

## 2024-03-21 DIAGNOSIS — Z86711 Personal history of pulmonary embolism: Secondary | ICD-10-CM

## 2024-03-21 DIAGNOSIS — I672 Cerebral atherosclerosis: Secondary | ICD-10-CM | POA: Diagnosis not present

## 2024-03-21 LAB — URINE CULTURE: Culture: NO GROWTH

## 2024-03-21 MED ORDER — ACETAMINOPHEN 325 MG PO TABS
650.0000 mg | ORAL_TABLET | Freq: Four times a day (QID) | ORAL | Status: DC | PRN
  Administered 2024-03-25: 650 mg via ORAL
  Filled 2024-03-21: qty 2

## 2024-03-21 MED ORDER — TAMSULOSIN HCL 0.4 MG PO CAPS
0.8000 mg | ORAL_CAPSULE | Freq: Every day | ORAL | Status: DC
Start: 2024-03-21 — End: 2024-03-27
  Administered 2024-03-21 – 2024-03-26 (×6): 0.8 mg via ORAL
  Filled 2024-03-21 (×6): qty 2

## 2024-03-21 MED ORDER — IOHEXOL 350 MG/ML SOLN
50.0000 mL | Freq: Once | INTRAVENOUS | Status: AC | PRN
  Administered 2024-03-21: 50 mL via INTRAVENOUS

## 2024-03-21 MED ORDER — MELATONIN 5 MG PO TABS
5.0000 mg | ORAL_TABLET | Freq: Every evening | ORAL | Status: DC | PRN
  Administered 2024-03-21: 5 mg via ORAL
  Filled 2024-03-21: qty 1

## 2024-03-21 MED ORDER — STROKE: EARLY STAGES OF RECOVERY BOOK
Freq: Once | Status: AC
  Filled 2024-03-21: qty 1

## 2024-03-21 MED ORDER — APIXABAN 5 MG PO TABS: 5.0000 mg | ORAL_TABLET | Freq: Two times a day (BID) | ORAL | Status: DC

## 2024-03-21 MED ORDER — PROCHLORPERAZINE EDISYLATE 10 MG/2ML IJ SOLN: 5.0000 mg | Freq: Four times a day (QID) | INTRAMUSCULAR | Status: DC | PRN

## 2024-03-21 MED ORDER — AMIODARONE HCL 200 MG PO TABS
100.0000 mg | ORAL_TABLET | Freq: Every day | ORAL | Status: DC
  Administered 2024-03-21 – 2024-03-26 (×6): 100 mg via ORAL
  Filled 2024-03-21 (×6): qty 1

## 2024-03-21 MED ORDER — ROSUVASTATIN CALCIUM 20 MG PO TABS
20.0000 mg | ORAL_TABLET | Freq: Every day | ORAL | Status: DC
  Administered 2024-03-21 – 2024-03-25 (×5): 20 mg via ORAL
  Filled 2024-03-21 (×5): qty 1

## 2024-03-21 MED ORDER — POLYETHYLENE GLYCOL 3350 17 G PO PACK: 17.0000 g | PACK | Freq: Every day | ORAL | Status: DC | PRN

## 2024-03-21 MED ORDER — LEVOTHYROXINE SODIUM 112 MCG PO TABS
112.0000 ug | ORAL_TABLET | Freq: Every day | ORAL | Status: DC
  Administered 2024-03-21 – 2024-03-26 (×6): 112 ug via ORAL
  Filled 2024-03-21 (×7): qty 1

## 2024-03-21 MED ORDER — APIXABAN 5 MG PO TABS
5.0000 mg | ORAL_TABLET | Freq: Two times a day (BID) | ORAL | Status: DC
  Administered 2024-03-21 – 2024-03-26 (×11): 5 mg via ORAL
  Filled 2024-03-21 (×12): qty 1

## 2024-03-21 NOTE — Hospital Course (Addendum)
 CC: AMS HPI: Thomas [name] is a 89 y.o. male with medical history significant for type 2 diabetes, hypertension, hypothyroidism, paroxysmal A-fib on Eliquis , sick sinus syndrome status post pacemaker placement, who presents to the ER due to altered mental status, generalized weakness since yesterday.  The patient uses a walker at baseline.  Today he was too weak to get out of bed.  Also with waxing and waning confusion.  Has had some urinary retention.  Denies dysuria or subjective fevers.   In the ER, tachypneic, alert and confused.  CT head and CT cervical spine were nonacute.  UA negative for pyuria.  Chest x-ray with no active disease.   ED Course: Temperature 97.5.  BP 141/76, pulse 70, respiratory rate 21, O2 saturation at 99% on room air.  Significant Events: Admitted 03/20/2024 for altered mental status   Admission Labs: VBG pH 7.423, PCO2 of 39 WBC 11, HgB 11.1, plt 152. MCV 103 NH3 of 18 Na 137, K 4.5, CO@ of 23, BUN 18, Scr 1.12, glu 136 T. Prot 6.5, Alb 3.3, AST 19, ALT 13, alk phos 47, t. Bili 0.3 UA negative  Admission Imaging Studies: CXR No active disease  CT head Extensive chronic white matter disease. Old right occipital infarct.  No acute abnormality. CT c-spine No evidence of acute cervical spine fracture, traumatic subluxation or static signs of instability. 2. Multilevel cervical spondylosis as described CTPA No evidence of pulmonary embolus. No acute cardiopulmonary disease. 4.2 cm ascending thoracic aortic aneurysm. Recommend annual imaging followup by CTA or MRA CT abd/pelvis Cholelithiasis.  No CT evidence of acute cholecystitis. Diffuse colonic diverticulosis.  No active diverticulitis. Aortic atherosclerosis. Air within the urinary bladder, presumably from recent catheterization.  Significant Labs:   Significant Imaging Studies: MRI brain 2 cm acute left thalamic infarct. 2. Small acute to subacute left frontal lobe infarcts. 3. Severe chronic small vessel  ischemic disease with multiple chronic infarcts as above. 4. Abnormal distal right vertebral artery which may reflect occlusion or slow flow CTA head/neck Extensive intracranial atherosclerosis including moderate to severe bilateral paraclinoid ICA stenoses, severe bilateral ACA and MCA branch vessel stenoses, and severe bilateral PCA stenoses including an apparent short segment occlusion of the left PCA near the P1-P2 junction. 2. Cervical carotid atherosclerosis with streak artifact severely limiting assessment in the region of the carotid bifurcations. Possible significant proximal ICA stenoses bilaterally. Consider further evaluation with carotid ultrasound or neck MRA. 3. Limited assessment of the vertebral arteries in the neck as detailed above. Diminutive right vertebral artery which is heavily diseased intracranially with occlusion of the distal V4 segment. 4.  Aortic Atherosclerosis (ICD10-I70.0) Echo LVEF 60-65%, mildly reduced RV systolic function  Antibiotic Therapy: Anti-infectives (From admission, onward)    None       Procedures:   Consultants: neurology

## 2024-03-21 NOTE — Assessment & Plan Note (Addendum)
 03/21/24 allow for permissive HTN for now.  03/22/24 continue with permissive HTN for now.  Neurology to decided on when normotension should be achieved. Coreg  still on hold.  03/23/24 awaiting for neurology to give clearance to restart HTN meds.  03/24/24 restart coreg  3.125 mg bid. Hold on midodrine  for now.  03/25/24 on coreg  3.125 mg bid. No need for midodrine  right now. Only give if SBP <90  03/26/24 DC to SNF on coreg  3.125 mg bid. Hold for SBP <140

## 2024-03-21 NOTE — Evaluation (Signed)
 Physical Therapy Evaluation Patient Details Name: Thomas [name] MRN: [id number] DOB: July 01, 1928 Today's Date: 03/21/2024  History of Present Illness  Thomas [name] is a 89 y.o. male  who presents to the ER due to altered mental status and generalized weakness.  The patient uses a walker at baseline. PMH: type 2 diabetes, hypertension, hypothyroidism, paroxysmal A-fib on Eliquis , sick sinus syndrome status post pacemaker placement  Clinical Impression  Pt admitted with above diagnosis. Pt was able to sit EOB with mod assist to get to EOB. Couldn't stand with max assist. Will likely need post acute rehab <  3hours day as he lives with elderly wife and has to be at supervision level on d/c. Will follow acutely.  Pt currently with functional limitations due to the deficits listed below (see PT Problem List). Pt will benefit from acute skilled PT to increase their independence and safety with mobility to allow discharge.           If plan is discharge home, recommend the following: A little help with walking and/or transfers;A little help with bathing/dressing/bathroom;Assistance with cooking/housework;Help with stairs or ramp for entrance;Assist for transportation;Supervision due to cognitive status   Can travel by private vehicle   No    Equipment Recommendations None recommended by PT  Recommendations for Other Services       Functional Status Assessment Patient has had a recent decline in their functional status and demonstrates the ability to make significant improvements in function in a reasonable and predictable amount of time.     Precautions / Restrictions Precautions Precautions: Fall Restrictions Weight Bearing Restrictions Per Provider Order: No      Mobility  Bed Mobility Overal bed mobility: Needs Assistance Bed Mobility: Supine to Sit, Sit to Supine     Supine to sit: Mod assist Sit to supine: Total assist   General bed mobility comments: Assist for LEs and  trunk elevation    Transfers Overall transfer level: Needs assistance Equipment used: 2 person hand held assist Transfers: Sit to/from Stand Sit to Stand: Max assist           General transfer comment: could not stand even with max assist.  Barely cleared buttocks off stretcher. also couldnt scoot to right in sitting.    Ambulation/Gait                  Stairs            Wheelchair Mobility     Tilt Bed    Modified Rankin (Stroke Patients Only)       Balance Overall balance assessment: Needs assistance Sitting-balance support: Feet supported, Bilateral upper extremity supported, Single extremity supported Sitting balance-Leahy Scale: Poor Sitting balance - Comments: relies on Ue and external support to sit EOB Postural control: Right lateral lean Standing balance support: Bilateral upper extremity supported, During functional activity Standing balance-Leahy Scale: Zero Standing balance comment: unable to stand with max assist                             Pertinent Vitals/Pain Pain Assessment Pain Assessment: No/denies pain    Home Living Family/patient expects to be discharged to:: Private residence Living Arrangements: Spouse/significant other;Children Available Help at Discharge: Family;Available 24 hours/day Type of Home: House Home Access: Level entry       Home Layout: One level Home Equipment: Agricultural consultant (2 wheels);Shower seat;Transport chair;Wheelchair - manual;BSC/3in1;Hospital bed;Lift chair Additional Comments: Pt lives with wife, son is  over 2-3 times a week, lives close by.    Prior Function Prior Level of Function : History of Falls (last six months)             Mobility Comments: uses RW in home, w/c in community. 2-3 falls in the last 6 months ADLs Comments: wife helps with socks/shoes, wife helps with getting in/out of bath, able to wash himself     Extremity/Trunk Assessment   Upper Extremity  Assessment Upper Extremity Assessment: Defer to OT evaluation    Lower Extremity Assessment Lower Extremity Assessment: Generalized weakness    Cervical / Trunk Assessment Cervical / Trunk Assessment: Kyphotic  Communication   Communication Communication: Impaired Factors Affecting Communication: Hearing impaired    Cognition Arousal: Alert Behavior During Therapy: Flat affect   PT - Cognitive impairments: Sequencing, Problem solving, Safety/Judgement, Memory, Awareness                         Following commands: Impaired Following commands impaired: Follows one step commands inconsistently, Follows one step commands with increased time     Cueing Cueing Techniques: Verbal cues, Gestural cues, Tactile cues     General Comments      Exercises     Assessment/Plan    PT Assessment Patient needs continued PT services  PT Problem List Decreased activity tolerance;Decreased balance;Decreased mobility;Decreased knowledge of use of DME;Decreased safety awareness;Decreased knowledge of precautions       PT Treatment Interventions DME instruction;Gait training;Functional mobility training;Therapeutic activities;Therapeutic exercise;Balance training;Patient/family education    PT Goals (Current goals can be found in the Care Plan section)  Acute Rehab PT Goals Patient Stated Goal: unable to state PT Goal Formulation: Patient unable to participate in goal setting Time For Goal Achievement: 04/04/24 Potential to Achieve Goals: Fair    Frequency Min 2X/week     Co-evaluation               AM-PAC PT 6 Clicks Mobility  Outcome Measure Help needed turning from your back to your side while in a flat bed without using bedrails?: A Lot Help needed moving from lying on your back to sitting on the side of a flat bed without using bedrails?: A Lot Help needed moving to and from a bed to a chair (including a wheelchair)?: Total Help needed standing up from a  chair using your arms (e.g., wheelchair or bedside chair)?: Total Help needed to walk in hospital room?: Total Help needed climbing 3-5 steps with a railing? : Total 6 Click Score: 8    End of Session Equipment Utilized During Treatment: Gait belt Activity Tolerance: Patient limited by fatigue Patient left: with call bell/phone within reach Merchant navy officer) Nurse Communication: Mobility status PT Visit Diagnosis: Muscle weakness (generalized) (M62.81);Other abnormalities of gait and mobility (R26.89)    Time: 8940-8888 PT Time Calculation (min) (ACUTE ONLY): 12 min   Charges:   PT Evaluation $PT Eval Moderate Complexity: 1 Mod   PT General Charges $$ ACUTE PT VISIT: 1 Visit         Saim Almanza M,PT Acute Rehab Services [phone]   Stephane JULIANNA Bevel 03/21/2024, 2:49 PM

## 2024-03-21 NOTE — Consult Note (Signed)
 NEUROLOGY CONSULT NOTE   Date of service: March 21, 2024 [name] MRN:  [id number] DOB:  [birth year] Chief Complaint: Stroke on MRI  Requesting Provider: Laurence Locus, DO  History of Present Illness  Thomas [name] is a 89 y.o. male with hx of prior stroke, P afib, Hx of massive PE on Eliquis , SSS s/p PPM, CAD, HLD, HTN, DM, hypothyroidism, macular degeneration, hearing loss who presents from home for evaluation of AMS, and generalized weakness. MRI brain with acute left thalamic infarct and few small acute/subacute infarcts in left frontal lobe.  Wife and son are at the bedside. Wife says she is his caregiver. Patient walks with a walker, he needs assistance with dressing and bathing, and she doses his medications. They both state that he is normally alert and oriented and can carry on a conversation and will normally know current events. She states he has been regularly taking the Eliquis  and has not missed a dose.  Wife states Monday night he was in his normal state of health prior to bed. When he woke on Tuesday morning she stated that he was hard to get out of the bed and he kept leaning to the right. She called EMS for assistance to get him out of bed and EMS placed him in the chair. She did not notice any other problems. He did fall asleep in the chair which is unusual for him and she tried to wake him and she was unable to get him to respond to her. She then called EMS again, once EMS arrived he seemed to be confused.  Neurology consulted.   LKW: Monday evening prior to bed Modified rankin score: 4-Needs assistance to walk and tend to bodily needs IV Thrombolysis:  No outside window  EVT:  No LVO   NIHSS components Score: Comment  1a Level of Conscious 0[]  1[x]  2[]  3[]      1b LOC Questions 0[]  1[x]  2[]       1c LOC Commands 0[x]  1[]  2[]       2 Best Gaze 0[x]  1[]  2[]       3 Visual 0[x]  1[]  2[]  3[]      4 Facial Palsy 0[x]  1[]  2[]  3[]      5a Motor Arm - left 0[x]   1[]  2[]  3[]  4[]  UN[]    5b Motor Arm - Right 0[]  1[x]  2[]  3[]  4[]  UN[]    6a Motor Leg - Left 0[x]  1[]  2[]  3[]  4[]  UN[]    6b Motor Leg - Right 0[]  1[x]  2[]  3[]  4[]  UN[]    7 Limb Ataxia 0[x]  1[]  2[]  UN[]      8 Sensory 0[x]  1[]  2[]  UN[]      9 Best Language 0[]  1[x]  2[]  3[]      10 Dysarthria 0[]  1[x]  2[]  UN[]      11 Extinct. and Inattention 0[x]  1[]  2[]       TOTAL: 6      ROS   Comprehensive ROS Unable to ascertain due to confusion  Past History   Past Medical History:  Diagnosis Date   Arthritis    Clotting disorder    Coronary artery disease    Diabetes ([name])    Hyperlipidemia    Hypertension    Hypothyroidism 07/17/2020   Kidney disease    Loss of hearing    Macular degeneration    Myocardial infarction [facility])    Osteoarthritis    Stroke [facility])     Past Surgical History:  Procedure Laterality Date   CARPAL TUNNEL RELEASE  2011   CORONARY ANGIOPLASTY  WITH STENT PLACEMENT     CORONARY ARTERY BYPASS GRAFT  2001   x6   REPLACEMENT TOTAL KNEE     SPINE SURGERY  2008   cervical and lumbar   THYROIDECTOMY      Family History: Family History  Problem Relation Age of Onset   Stroke Mother    Stroke Father    Heart disease Sister    Cancer Daughter    Hypertension Son    Heart disease Brother    Cancer Brother    Heart disease Brother     Social History  reports that he has quit smoking. His smoking use included pipe. He has never used smokeless tobacco. He reports that he does not drink alcohol and does not use drugs.  Allergies  Allergen Reactions   Amitriptyline Shortness Of Breath and Palpitations   Lyrica [Pregabalin] Nausea And Vomiting   Pravachol [Pravastatin] Other (See Comments)    Muscle cramps and aches    Effexor Xr [Venlafaxine] Anxiety   Morphine Palpitations   Zolpidem Anxiety and Palpitations    Medications   Current Facility-Administered Medications:    acetaminophen  (TYLENOL ) tablet 650 mg, 650 mg, Oral, Q6H PRN, Hall, Carole N, DO    apixaban  (ELIQUIS ) tablet 5 mg, 5 mg, Oral, BID, Hall, Carole N, DO, 5 mg at 03/21/24 1024   levothyroxine  (SYNTHROID ) tablet 112 mcg, 112 mcg, Oral, Q0600, Shona Terry SAILOR, DO, 112 mcg at 03/21/24 0533   melatonin tablet 5 mg, 5 mg, Oral, QHS PRN, Shona, Carole N, DO   polyethylene glycol (MIRALAX  / GLYCOLAX ) packet 17 g, 17 g, Oral, Daily PRN, Hall, Carole N, DO   prochlorperazine  (COMPAZINE ) injection 5 mg, 5 mg, Intravenous, Q6H PRN, Shona Terry SAILOR, DO  Current Outpatient Medications:    acetaminophen  (TYLENOL ) 500 MG tablet, Take 650 mg by mouth in the morning and at bedtime. Take one tablet (500mg ) by mouth twice per day in the morning and evening., Disp: , Rfl:    amiodarone  (PACERONE ) 200 MG tablet, Take 1/2 tablet (100mg ) by mouth once daily, Disp: 45 tablet, Rfl: 1   apixaban  (ELIQUIS ) 5 MG TABS tablet, Take 5 mg by mouth 2 (two) times daily., Disp: , Rfl:    B Complex-C (SUPER B COMPLEX PO), Take 1 tablet by mouth in the morning., Disp: , Rfl:    carvedilol  (COREG ) 3.125 MG tablet, Take 1 tablet (3.125 mg total) by mouth 2 (two) times daily as needed (take only if Systolic Blood Pressure greater than 140)., Disp: 180 tablet, Rfl: 2   cholecalciferol  (VITAMIN D3) 25 MCG (1000 UNIT) tablet, Take 1,000 Units by mouth daily., Disp: , Rfl:    co-enzyme Q-10 50 MG capsule, Take 50 mg by mouth daily., Disp: , Rfl:    furosemide  (LASIX ) 20 MG tablet, Take 1 tablet (20 mg total) by mouth daily., Disp: 30 tablet, Rfl: 3   gabapentin  (NEURONTIN ) 100 MG capsule, TAKE 2 CAPSULES(200 MG) BY MOUTH AT BEDTIME, Disp: 180 capsule, Rfl: 1   glucosamine-chondroitin 500-400 MG tablet, Take 1 tablet by mouth in the morning and at bedtime., Disp: , Rfl:    insulin  glargine (LANTUS ) 100 UNIT/ML injection, Inject 13 Units into the skin daily., Disp: , Rfl:    ketoconazole  (NIZORAL ) 2 % cream, Apply 1 Application topically 2 (two) times daily. (Patient taking differently: Apply 1 Application topically 2 (two) times  daily as needed for irritation (skin irritation behin ear).), Disp: 60 g, Rfl: 0   levothyroxine  (SYNTHROID ) 112 MCG  tablet, TAKE 1 TABLET(112 MCG) BY MOUTH DAILY BEFORE BREAKFAST, Disp: 90 tablet, Rfl: 1   midodrine  (PROAMATINE ) 2.5 MG tablet, Take 1 tablet (2.5 mg total) by mouth 3 (three) times daily with meals. (Patient taking differently: Take 2.5 mg by mouth 2 (two) times daily with a meal.), Disp: 270 tablet, Rfl: 1   Multiple Vitamin (MULTIVITAMIN) tablet, Take 1 tablet by mouth daily., Disp: , Rfl:    Red Yeast Rice 600 MG CAPS, Take 600 mg by mouth in the morning and at bedtime., Disp: , Rfl:    rosuvastatin  (CRESTOR ) 20 MG tablet, Take 1 tablet (20 mg total) by mouth at bedtime., Disp: 90 tablet, Rfl: 1   tamsulosin  (FLOMAX ) 0.4 MG CAPS capsule, Take 2 capsules (0.8 mg total) by mouth daily after supper., Disp: 180 capsule, Rfl: 3  Vitals   Vitals:   03/21/24 1130 03/21/24 1400 03/21/24 1415 03/21/24 1438  BP: (!) 174/61 (!) 117/55    Pulse: 65 69 69   Resp: 18 (!) 29 19   Temp:    98.2 F (36.8 C)  TempSrc:    Axillary  SpO2: 100% 100% 100%     There is no height or weight on file to calculate BMI.   Physical Exam   Constitutional: Appears well-developed and well-nourished.   Psych: Affect appropriate to situation.   Eyes: No scleral injection.   HENT: No OP obstruction.   Head: Normocephalic.   Cardiovascular: Normal rate and regular rhythm.   Respiratory: Effort normal, non-labored breathing.   GI: Soft.  No distension. There is no tenderness.   Skin: WDI.    Neurologic Examination    Mental Status -  Asleep, awakens easily to voice. He is oriented to self and can identify his wife and son. Stated age as 65 stated he was in [place], knew it was September. Unable to state current year 1963  Cranial Nerves II - XII - II - Visual field intact OU . III, IV, VI - Extraocular movements intact . V - Facial sensation intact bilaterally . VII - Facial movement  intact bilaterally . VIII - Hearing & vestibular intact bilaterally . X - Palate elevates symmetrically . XI - Chin turning & shoulder shrug intact bilaterally . XII - Tongue protrusion intact .  Motor Strength - The patient's strength was normal in left arm and left leg. Slight drift in right arm 4/5 and right leg 4/5  Bulk was normal and fasciculations were absent .   Motor Tone - Muscle tone was assessed at the neck and appendages and was normal . Sensory - symmetric bilaterally to light touch  Coordination - FTN intact  Gait and Station - deferred.  Labs/Imaging/Neurodiagnostic studies   CBC:  Recent Labs  Lab Mar 24, 2024 1440 03/24/24 1507 24-Mar-2024 1508  WBC 11.0*  --   --   NEUTROABS 6.6  --   --   HGB 11.1* 10.5* 10.5*  HCT 34.6* 31.0* 31.0*  MCV 103.3*  --   --   PLT 152  --   --    Basic Metabolic Panel:  Lab Results  Component Value Date   NA 140 03/24/2024   K 4.6 03/24/24   CO2 23 2024-03-24   GLUCOSE 138 (H) 24-Mar-2024   BUN 25 (H) 03/24/24   CREATININE 1.30 (H) 03-24-2024   CALCIUM  8.4 (L) Mar 24, 2024   GFRNONAA >60 2024/03/24   GFRAA 66 08/07/2020   Lipid Panel:  Lab Results  Component Value Date   LDLCALC 38  08/04/2023   HgbA1c:  Lab Results  Component Value Date   HGBA1C 8.3 (A) 02/06/2024   Urine Drug Screen: No results found for: LABOPIA, COCAINSCRNUR, LABBENZ, AMPHETMU, THCU, LABBARB  Alcohol Level No results found for: [facility] INR  Lab Results  Component Value Date   INR 2.3 12/12/2020   APTT  Lab Results  Component Value Date   APTT 39 (H) 08/31/2020   CT Head without contrast(Personally reviewed): NO acute abnormality. Extensive chronic white matter disease. Old right occipital infarct.   MRI Brain(Personally reviewed): 1. 2 cm acute left thalamic infarct. 2. Small acute to subacute left frontal lobe infarcts. 3. Severe chronic small vessel ischemic disease with multiple chronic infarcts as above. 4. Abnormal distal  right vertebral artery which may reflect occlusion or slow flow.  ASSESSMENT   Jailin Moomaw is a 89 y.o. male hx of prior stroke, P afib, Hx of massive PE on Eliquis , SSS s/p PPM, CAD, HLD, HTN, DM, hypothyroidism, macular degeneration, hearing loss who presents from home for evaluation of AMS, and generalized weakness. MRI brain with acute left thalamic infarct and few small acute/subacute infarcts in left frontal lobe.   RECOMMENDATIONS  Stroke workup to include: - HgbA1c, fasting lipid panel - Frequent neuro checks - Echocardiogram - CTA head and neck - Continue home Eliquis   - Risk factor modification - Telemetry monitoring - PT consult, OT consult, Speech consult - Stroke team to follow ______________________________________________________________________  Signed, Karna DELENA Geralds, NP [facility]  I have seen the patient and reviewed the above note.  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  In the setting of emboli despite anticoagulation, there is no convincing data to suggest changing from one anticoagulant to another is beneficial and therefore I would continue Eliquis  at this time.  Vascular imaging to look at the left carotid also would be prudent.  Stroke team to follow.  Aisha Seals, MD [facility]   If 7pm- 7am, please page neurology on call as listed in [facility]ION.

## 2024-03-21 NOTE — Assessment & Plan Note (Addendum)
 03/21/24 stable. Continue synthroid .  03/22/24 stable  03/23/24 stable  03/24/24 stable.  03/25/24 continue synthroid  112 mcg daily.  03/26/24 DC to SNF with synthroid  112 mcg daily

## 2024-03-21 NOTE — ED Notes (Signed)
 Patient transported to MRI with Arnaldo RN staying with pt for monitoring.

## 2024-03-21 NOTE — ED Notes (Signed)
 RN offered a beverage. Pt declined

## 2024-03-21 NOTE — Evaluation (Signed)
 Occupational Therapy Evaluation Patient Details Name: Thomas [name] MRN: [id number] DOB: Nov 10, 1928 Today's Date: 03/21/2024   History of Present Illness   Thomas [name] is a 89 y.o. male  who presents to the ER due to altered mental status and generalized weakness.  The patient uses a walker at baseline. PMH: type 2 diabetes, hypertension, hypothyroidism, paroxysmal A-fib on Eliquis , sick sinus syndrome status post pacemaker placement     Clinical Impressions Pt resting in bed, confused, A/Ox1, had difficulty with birth date, son and wife in room. Pt lives at home with wife, PLOF set up/supervision for ADLs, supervision with RW for mobility, w/c for community. Pt currently with significant deficits, max-total A for ADLs, unable to sit EOB without support due to L lateral lean, Pt RUE weak and with decreased FM skills. Pt max/total for bed mobility. Pt would benefit from continued acute OT to maximize functional participation in ADLs, DC to postacute rehab <3hrs/day recommended.     If plan is discharge home, recommend the following:   Two people to help with walking and/or transfers;Two people to help with bathing/dressing/bathroom;Assistance with cooking/housework;Assist for transportation;Help with stairs or ramp for entrance     Functional Status Assessment   Patient has had a recent decline in their functional status and demonstrates the ability to make significant improvements in function in a reasonable and predictable amount of time.     Equipment Recommendations   Other (comment) (defer)     Recommendations for Other Services         Precautions/Restrictions   Precautions Precautions: Fall Recall of Precautions/Restrictions: Impaired Restrictions Weight Bearing Restrictions Per Provider Order: No     Mobility Bed Mobility Overal bed mobility: Needs Assistance Bed Mobility: Supine to Sit, Sit to Supine     Supine to sit: Max assist Sit to supine:  Total assist   General bed mobility comments: max A to assist to EOB, total for return to bed    Transfers Overall transfer level: Needs assistance                 General transfer comment: unable to stand, unable to sit EOB without max support for balance with L lateral lean      Balance Overall balance assessment: Needs assistance Sitting-balance support: Bilateral upper extremity supported, Feet supported Sitting balance-Leahy Scale: Poor Sitting balance - Comments: L lateral lean, needs external support Postural control: Left lateral lean   Standing balance-Leahy Scale: Zero                             ADL either performed or assessed with clinical judgement   ADL Overall ADL's : Needs assistance/impaired Eating/Feeding: Minimal assistance;Moderate assistance;Bed level   Grooming: Minimal assistance;Cueing for sequencing;Bed level           Upper Body Dressing : Moderate assistance;Bed level   Lower Body Dressing: Total assistance;Bed level       Toileting- Clothing Manipulation and Hygiene: Total assistance;Bed level         General ADL Comments: Pt bed level, with strong L lateral lean when attempting to sit on EOB, unable to maintain balance. Pt with decreased coordination on R side     Vision         Perception         Praxis         Pertinent Vitals/Pain Pain Assessment Pain Assessment: No/denies pain     Extremity/Trunk Assessment Upper Extremity  Assessment Upper Extremity Assessment: Generalized weakness;RUE deficits/detail RUE Deficits / Details: generalized weakness, poor R hand coordination, decreased FM skills, was able to hold a cup and bring to mouth RUE Coordination: decreased fine motor   Lower Extremity Assessment Lower Extremity Assessment: Generalized weakness   Cervical / Trunk Assessment Cervical / Trunk Assessment: Kyphotic   Communication Communication Communication: Impaired Factors Affecting  Communication: Hearing impaired   Cognition Arousal: Alert Behavior During Therapy: Flat affect Cognition: Cognition impaired   Orientation impairments: Place, Time, Situation         OT - Cognition Comments: Confused, A/Ox1, difficulty recalling birthdate., at baseline is fully oriented. Pt with difficulty following instructions, increased time                 Following commands: Impaired Following commands impaired: Follows one step commands inconsistently, Follows one step commands with increased time     Cueing  General Comments   Cueing Techniques: Verbal cues;Gestural cues;Tactile cues      Exercises     Shoulder Instructions      Home Living Family/patient expects to be discharged to:: Private residence Living Arrangements: Spouse/significant other;Children Available Help at Discharge: Family;Available 24 hours/day Type of Home: House Home Access: Level entry     Home Layout: One level     Bathroom Shower/Tub: Producer, television/film/video: Handicapped height     Home Equipment: Agricultural consultant (2 wheels);Shower seat;Transport chair;Wheelchair - manual;BSC/3in1;Hospital bed;Lift chair   Additional Comments: Pt lives with wife, son is over 2-3 times a week, lives close by.      Prior Functioning/Environment Prior Level of Function : History of Falls (last six months)             Mobility Comments: uses RW in home, w/c in community. 2-3 falls in the last 6 months ADLs Comments: wife helps with socks/shoes, wife helps with getting in/out of bath, able to wash himself    OT Problem List: Decreased strength;Decreased range of motion;Decreased activity tolerance;Impaired balance (sitting and/or standing);Impaired vision/perception;Decreased coordination;Decreased cognition;Decreased safety awareness;Impaired UE functional use;Pain   OT Treatment/Interventions: Self-care/ADL training;Therapeutic exercise;Energy conservation;DME and/or AE  instruction;Therapeutic activities;Patient/family education;Balance training      OT Goals(Current goals can be found in the care plan section)   Acute Rehab OT Goals Patient Stated Goal: unable to participate in setting goals OT Goal Formulation: With family Time For Goal Achievement: 04/04/24 Potential to Achieve Goals: Good   OT Frequency:  Min 2X/week    Co-evaluation              AM-PAC OT 6 Clicks Daily Activity     Outcome Measure Help from another person eating meals?: A Little Help from another person taking care of personal grooming?: A Little Help from another person toileting, which includes using toliet, bedpan, or urinal?: Total Help from another person bathing (including washing, rinsing, drying)?: Total Help from another person to put on and taking off regular upper body clothing?: A Lot Help from another person to put on and taking off regular lower body clothing?: Total 6 Click Score: 11   End of Session Nurse Communication: Mobility status  Activity Tolerance: Patient tolerated treatment well Patient left: in bed;with call bell/phone within reach;with nursing/sitter in room;with family/visitor present  OT Visit Diagnosis: Unsteadiness on feet (R26.81);Other abnormalities of gait and mobility (R26.89);Muscle weakness (generalized) (M62.81);Other symptoms and signs involving cognitive function;Hemiplegia and hemiparesis  Time: 1445-1510 OT Time Calculation (min): 25 min Charges:  OT General Charges $OT Visit: 1 Visit OT Evaluation $OT Eval Moderate Complexity: 1 Mod OT Treatments $Self Care/Home Management : 8-22 mins  [place], OTR/L   Elouise JONELLE Bott 03/21/2024, 3:20 PM

## 2024-03-21 NOTE — Assessment & Plan Note (Addendum)
 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 Neurology consulted.  03/22/24 neurology following. CTA head/neck showed Extensive intracranial atherosclerosis. Remains on Eliquis . Neurology to decide if pt needs antiplatelet therapy.  03/23/24 nothing further from neurology. Echo shows LVEF 60%. No thrombus. Awaiting SNF placement. Son is aware of overall poor prognosis.  03/24/24 on ASA 81 mg daily. Crestor  20 mg at bedtime. Continue eliquis . Restart HTN meds.  03/25/24 stable. On ASA 81 qday, Eliquis  5 mg bid, crestor  20 mg at bedtime. Neurology has signed off. For DC to SNF tomorrow AM. Will prep DC orders.  03/26/24 stable for DC today.

## 2024-03-21 NOTE — CV Procedure (Signed)
  Device system confirmed to be MRI conditional, with implant date > 6 weeks ago, and no evidence of abandoned or epicardial leads in review of most recent CXR  Device last cleared by EP Provider: Prentice Passey 03/21/24  Clearance is good through for 1 year as long as parameters remain stable at time of check. If pt undergoes a cardiac device procedure during that time, they should be re-cleared.   Tachy-therapies to be programmed off if applicable with device back to pre-MRI settings after completion of exam.  [employer] - Programming recommendation received through [employer] App/Tablet  Rocky Catalan, RT  03/21/2024 8:03 AM

## 2024-03-21 NOTE — Subjective & Objective (Addendum)
 Pt seen and examined. Ready for DC. SNF bed available and Ins authorization obtained. No family at bedside.

## 2024-03-21 NOTE — Assessment & Plan Note (Addendum)
 03/21/24 check A1c. Add SSI.  03/22/24 A1c of 8.1%. could use some better outpatient control of his DM but at age 89 y/o, not sure how much better he can get.  03/23/24 CBG stable  03/24/24 stable  03/25/24 stable. Will restart lower dose lantus  at SNF. Current CBG during hospitalization do not require long acting insulin . Continue with SSI  03/26/24 will decrease lantus  dose to 5 units QAM with breakfast.

## 2024-03-21 NOTE — H&P (Addendum)
 History and Physical  Thomas [name] FMW:[id number] DOB: 23-Jun-1929 DOA: 03/20/2024  Referring physician: Hamp, PA-TRH  PCP: Kennyth Worth HERO, MD  Outpatient Specialists: Cardiology, podiatry. Patient coming from: Home.  Chief Complaint: Altered mental status.  HPI: Thomas [name] is a 89 y.o. male with medical history significant for type 2 diabetes, hypertension, hypothyroidism, paroxysmal A-fib on Eliquis , sick sinus syndrome status post pacemaker placement, who presents to the ER due to altered mental status, generalized weakness since yesterday.  The patient uses a walker at baseline.  Today he was too weak to get out of bed.  Also with waxing and waning confusion.  Has had some urinary retention.  Denies dysuria or subjective fevers.  In the ER, tachypneic, alert and confused.  CT head and CT cervical spine were nonacute.  UA negative for pyuria.  Chest x-ray with no active disease.  ED Course: Temperature 97.5.  BP 141/76, pulse 70, respiratory rate 21, O2 saturation at 99% on room air.  Review of Systems: Review of systems as noted in the HPI. All other systems reviewed and are negative.   Past Medical History:  Diagnosis Date   Arthritis    Clotting disorder    Coronary artery disease    Diabetes ([name])    Hyperlipidemia    Hypertension    Hypothyroidism 07/17/2020   Kidney disease    Loss of hearing    Macular degeneration    Myocardial infarction [facility])    Osteoarthritis    Stroke [facility])    Past Surgical History:  Procedure Laterality Date   CARPAL TUNNEL RELEASE  2011   CORONARY ANGIOPLASTY WITH STENT PLACEMENT     CORONARY ARTERY BYPASS GRAFT  2001   x6   REPLACEMENT TOTAL KNEE     SPINE SURGERY  2008   cervical and lumbar   THYROIDECTOMY      Social History:  reports that he has quit smoking. His smoking use included pipe. He has never used smokeless tobacco. He reports that he does not drink alcohol and does not use drugs.   Allergies  Allergen  Reactions   Amitriptyline Shortness Of Breath and Palpitations   Lyrica [Pregabalin] Nausea And Vomiting   Pravachol [Pravastatin] Other (See Comments)    Muscle cramps and aches    Effexor Xr [Venlafaxine] Anxiety   Morphine Palpitations   Zolpidem Anxiety and Palpitations    Family History  Problem Relation Age of Onset   Stroke Mother    Stroke Father    Heart disease Sister    Cancer Daughter    Hypertension Son    Heart disease Brother    Cancer Brother    Heart disease Brother       Prior to Admission medications   Medication Sig Start Date End Date Taking? Authorizing Provider  apixaban  (ELIQUIS ) 5 MG TABS tablet Take 5 mg by mouth 2 (two) times daily.   Yes [provider]  acetaminophen  (TYLENOL ) 325 MG tablet Take 650 mg by mouth every 6 (six) hours as needed for moderate pain or headache.    [provider]  amiodarone  (PACERONE ) 200 MG tablet Take 1/2 tablet (100mg ) by mouth once daily 09/26/23   Nahser, Aleene PARAS, MD  carvedilol  (COREG ) 3.125 MG tablet Take 1 tablet (3.125 mg total) by mouth 2 (two) times daily as needed (take only if Systolic Blood Pressure greater than 140). 04/07/22   Hobart Powell BRAVO, MD  cholecalciferol  (VITAMIN D3) 25 MCG (1000 UNIT) tablet Take 1,000 Units by  mouth daily.    [provider]  co-enzyme Q-10 50 MG capsule Take 50 mg by mouth daily.    [provider]  furosemide  (LASIX ) 20 MG tablet Take 1 tablet (20 mg total) by mouth daily. 11/18/23   Kennyth Worth HERO, MD  gabapentin  (NEURONTIN ) 100 MG capsule TAKE 2 CAPSULES(200 MG) BY MOUTH AT BEDTIME 09/26/23   Parker, Caleb M, MD  glucosamine-chondroitin 500-400 MG tablet Take 1 tablet by mouth 3 (three) times daily.    [provider]  Insulin  Pen Needle (B-D UF III MINI PEN NEEDLES) 31G X 5 MM MISC Use daily with Lantus  pen 10/12/23   Kennyth Worth HERO, MD  ketoconazole  (NIZORAL ) 2 % cream Apply 1 Application topically 2 (two) times daily. 02/06/24    Kennyth Worth HERO, MD  LANTUS  SOLOSTAR 100 UNIT/ML Solostar Pen ADMINISTER 13 UNITS UNDER THE SKIN DAILY 02/08/23   Kennyth Worth HERO, MD  levothyroxine  (SYNTHROID ) 112 MCG tablet TAKE 1 TABLET(112 MCG) BY MOUTH DAILY BEFORE BREAKFAST 03/19/24   Kennyth Worth HERO, MD  midodrine  (PROAMATINE ) 2.5 MG tablet Take 1 tablet (2.5 mg total) by mouth 3 (three) times daily with meals. 09/26/23   Nahser, Aleene PARAS, MD  mirtazapine  (REMERON  SOL-TAB) 15 MG disintegrating tablet Take 1 tablet (15 mg total) by mouth at bedtime. 01/31/23   Kennyth Worth HERO, MD  Multiple Vitamin (MULTIVITAMIN) tablet Take 1 tablet by mouth daily.    [provider]  Red Yeast Rice Extract (RED YEAST RICE PO) Take 1 capsule by mouth in the morning and at bedtime.    [provider]  rosuvastatin  (CRESTOR ) 20 MG tablet Take 1 tablet (20 mg total) by mouth at bedtime. 09/26/23   Nahser, Aleene PARAS, MD  tamsulosin  (FLOMAX ) 0.4 MG CAPS capsule Take 2 capsules (0.8 mg total) by mouth daily after supper. 08/04/23   Kennyth Worth HERO, MD    Physical Exam: BP (!) 141/76 (BP Location: Left Arm)   Pulse 70   Temp (!) 97.5 F (36.4 C) (Axillary)   Resp (!) 21   SpO2 99%   General: 89 y.o. year-old male well developed well nourished in no acute distress.  Alert and oriented x 2. Cardiovascular: Regular rate and rhythm with no rubs or gallops.  No thyromegaly or JVD noted.  No lower extremity edema. 2/4 pulses in all 4 extremities. Respiratory: Clear to auscultation with no wheezes or rales. Good inspiratory effort. Abdomen: Soft nontender nondistended with normal bowel sounds x4 quadrants. Muskuloskeletal: No cyanosis, clubbing or edema noted bilaterally Neuro: CN II-XII intact, strength, sensation, reflexes Skin: No ulcerative lesions noted or rashes Psychiatry: Judgement and insight appear altered. Mood is appropriate for condition and setting          Labs on Admission:  Basic Metabolic Panel: Recent Labs  Lab 03/20/24 1440  03/20/24 1507 03/20/24 1508  NA 137 140 140  K 4.5 4.6 4.6  CL 105 107  --   CO2 23  --   --   GLUCOSE 136* 138*  --   BUN 18 25*  --   CREATININE 1.12 1.30*  --   CALCIUM  8.4*  --   --    Liver Function Tests: Recent Labs  Lab 03/20/24 1440  AST 19  ALT 13  ALKPHOS 47  BILITOT 0.3  PROT 6.5  ALBUMIN 3.3*   No results for input(s): LIPASE, AMYLASE in the last 168 hours. Recent Labs  Lab 03/20/24 1441  AMMONIA 18   CBC:  Recent Labs  Lab 03/20/24 1440 03/20/24 1507 03/20/24 1508  WBC 11.0*  --   --   NEUTROABS 6.6  --   --   HGB 11.1* 10.5* 10.5*  HCT 34.6* 31.0* 31.0*  MCV 103.3*  --   --   PLT 152  --   --    Cardiac Enzymes: No results for input(s): CKTOTAL, CKMB, CKMBINDEX, TROPONINI in the last 168 hours.  BNP (last 3 results) No results for input(s): BNP in the last 8760 hours.  ProBNP (last 3 results) No results for input(s): PROBNP in the last 8760 hours.  CBG: No results for input(s): GLUCAP in the last 168 hours.  Radiological Exams on Admission: CT ABDOMEN PELVIS W CONTRAST Result Date: 03/20/2024 CLINICAL DATA:  Epigastric pain, altered mental status. EXAM: CT ABDOMEN AND PELVIS WITH CONTRAST TECHNIQUE: Multidetector CT imaging of the abdomen and pelvis was performed using the standard protocol following bolus administration of intravenous contrast. RADIATION DOSE REDUCTION: This exam was performed according to the departmental dose-optimization program which includes automated exposure control, adjustment of the mA and/or kV according to patient size and/or use of iterative reconstruction technique. CONTRAST:  80mL OMNIPAQUE  IOHEXOL  350 MG/ML SOLN COMPARISON:  None Available. FINDINGS: Lower chest: See chest CT report. Hepatobiliary: Layering gallstones within the gallbladder. No biliary ductal dilatation. No focal hepatic abnormality. Pancreas: No focal abnormality or ductal dilatation. Spleen: No focal abnormality.  Normal size.  Adrenals/Urinary Tract: Air within the urinary bladder, presumably from recent catheterization. Recommend clinical correlation. No hydronephrosis. No suspicious renal or adrenal abnormality. Stomach/Bowel: Diffuse colonic diverticulosis. No active diverticulitis. No bowel obstruction or inflammatory process. Vascular/Lymphatic: Aortic atherosclerosis. No evidence of aneurysm or adenopathy. Reproductive: No visible focal abnormality. Other: No free fluid or free air. Musculoskeletal: No acute bony abnormality. IMPRESSION: Cholelithiasis.  No CT evidence of acute cholecystitis. Diffuse colonic diverticulosis.  No active diverticulitis. Aortic atherosclerosis. Air within the urinary bladder, presumably from recent catheterization. Recommend clinical correlation. Electronically Signed   By: Franky Crease M.D.   On: 03/20/2024 21:34   CT Angio Chest PE W/Cm &/Or Wo Cm Result Date: 03/20/2024 CLINICAL DATA:  Pulmonary embolism (PE) suspected, high prob. Altered mental status. EXAM: CT ANGIOGRAPHY CHEST WITH CONTRAST TECHNIQUE: Multidetector CT imaging of the chest was performed using the standard protocol during bolus administration of intravenous contrast. Multiplanar CT image reconstructions and MIPs were obtained to evaluate the vascular anatomy. RADIATION DOSE REDUCTION: This exam was performed according to the departmental dose-optimization program which includes automated exposure control, adjustment of the mA and/or kV according to patient size and/or use of iterative reconstruction technique. CONTRAST:  80mL OMNIPAQUE  IOHEXOL  350 MG/ML SOLN COMPARISON:  None Available. FINDINGS: Cardiovascular: Heart is normal size. Prior CABG. Mild aneurysmal dilatation of the ascending thoracic aorta measuring 4.2 cm. Aortic atherosclerosis. No dissection. No filling defects in the pulmonary arteries to suggest pulmonary emboli. Mediastinum/Nodes: No mediastinal, hilar, or axillary adenopathy. Trachea and esophagus are  unremarkable. Thyroid  unremarkable. Moderate-sized hiatal hernia. Lungs/Pleura: No confluent airspace opacities or effusions. Upper Abdomen: Small layering gallstones within the gallbladder. No acute findings. Musculoskeletal: Chest wall soft tissues are unremarkable. No acute bony abnormality. Review of the MIP images confirms the above findings. IMPRESSION: No evidence of pulmonary embolus. No acute cardiopulmonary disease. 4.2 cm ascending thoracic aortic aneurysm. Recommend annual imaging followup by CTA or MRA. This recommendation follows 2010 ACCF/AHA/AATS/ACR/ASA/SCA/SCAI/SIR/STS/SVM Guidelines for the Diagnosis and Management of Patients with Thoracic Aortic Disease. Circulation. 2010; 121: Z733-z630. Aortic aneurysm NOS (ICD10-I71.9) Cholelithiasis.  Aortic Atherosclerosis (ICD10-I70.0). Electronically Signed   By: Franky Crease M.D.   On: 03/20/2024 21:31   CT Head Wo Contrast Result Date: 03/20/2024 CLINICAL DATA:  Altered mental status EXAM: CT HEAD WITHOUT CONTRAST TECHNIQUE: Contiguous axial images were obtained from the base of the skull through the vertex without intravenous contrast. RADIATION DOSE REDUCTION: This exam was performed according to the departmental dose-optimization program which includes automated exposure control, adjustment of the mA and/or kV according to patient size and/or use of iterative reconstruction technique. COMPARISON:  MRI September 01, 2020 FINDINGS: CT HEAD: There is extensive chronic low attenuation in the cerebral white matter. Old right occipital infarct. There is no hemorrhage. No acute ischemic changes. No mass lesion. The ventricles are normal. Skull/sinuses/orbits: No significant abnormality. IMPRESSION: Extensive chronic white matter disease. Old right occipital infarct. No acute abnormality. Electronically Signed   By: Nancyann Burns M.D.   On: 03/20/2024 16:13   CT Cervical Spine Wo Contrast Result Date: 03/20/2024 CLINICAL DATA:  Neck trauma. Altered mental  status since yesterday. Urinary retention. EXAM: CT CERVICAL SPINE WITHOUT CONTRAST TECHNIQUE: Multidetector CT imaging of the cervical spine was performed without intravenous contrast. Multiplanar CT image reconstructions were also generated. RADIATION DOSE REDUCTION: This exam was performed according to the departmental dose-optimization program which includes automated exposure control, adjustment of the mA and/or kV according to patient size and/or use of iterative reconstruction technique. COMPARISON:  CT cervical spine 10/10/2021. FINDINGS: Alignment: Exaggerated thoracic kyphosis with similar mild stepwise degenerative anterolisthesis at C4-5, C5-6, C6-7 and C7-T1. Skull base and vertebrae: No evidence of acute cervical spine fracture or traumatic subluxation. Postsurgical changes from C3-4 ACDF. There is multilevel spondylosis with partially bridging anterior osteophytes from C5 through T1. Multilevel facet arthropathy. Soft tissues and spinal canal: No prevertebral fluid or swelling. No visible canal hematoma. Disc levels: Multilevel spondylosis with uncinate spurring and facet hypertrophy. There are advanced degenerative changes at the lateral C1-2 articulations, greater on the left. There is multilevel mild-to-moderate foraminal narrowing which appears chronic. Upper chest: Clear lung apices. Left subclavian pacemaker leads noted. Other: None. IMPRESSION: 1. No evidence of acute cervical spine fracture, traumatic subluxation or static signs of instability. 2. Multilevel cervical spondylosis as described. Electronically Signed   By: Elsie Perone M.D.   On: 03/20/2024 16:05   DG Chest Portable 1 View Result Date: 03/20/2024 CLINICAL DATA:  Altered mental status. EXAM: PORTABLE CHEST 1 VIEW COMPARISON:  10/10/2021. FINDINGS: Low lung volume. Mildly elevated right hemidiaphragm. Bilateral lung fields are clear. Bilateral costophrenic angles are clear. Stable cardio-mediastinal silhouette. There is a  left sided 2-lead pacemaker. There are surgical staples along the heart border and sternotomy wires, status post CABG (coronary artery bypass graft). No acute osseous abnormalities. Multiple old healed left rib fractures noted. The soft tissues are within normal limits. IMPRESSION: No active disease. Electronically Signed   By: Ree Molt M.D.   On: 03/20/2024 15:27    EKG: I independently viewed the EKG done and my findings are as followed: Atrial paced rate of 70.  Nonspecific ST-T changes.  QTc 466.  Assessment/Plan Present on Admission:  AMS (altered mental status)  Principal Problem:   AMS (altered mental status)  Acute metabolic encephalopathy, unclear etiology Follow-up brain MRI Reorient as needed Fall precautions Continue to treat underlying conditions  Generalized weakness PT OT assessment Fall precautions  Hypothyroidism Resume home levothyroxine   Paroxysmal A-fib on Eliquis  Resume home Eliquis  Monitor on telemetry.  CKD 3B At baseline Continue to avoid  nephrotoxic agents dehydration, hypotension. Monitor urine output    Time: 75 minutes.    DVT prophylaxis: Home Eliquis .  Code Status: Full code.  Family Communication: None at bedside.  Disposition Plan: Admitted to the telemetry medical unit.  Consults called: None.  Admission status: Observation status.   Status is: Observation    Terry LOISE Hurst MD [facility] Pager [phone]  If 7PM-7AM, please contact night-coverage [email or web address] Password [email or web address]  03/21/2024, 1:05 AM

## 2024-03-21 NOTE — ED Notes (Signed)
 Pt was pulling at gown and pulse ox. RN  redirected pt.

## 2024-03-21 NOTE — ED Notes (Signed)
 Pt transferred onto hospital bed for comfort. Clean linens and incontinence pad placed. Pt remains connected to cardiac monitor. Call light within reach. Wife and son at bedside. Lunch tray provided, wife helping to feed pt.

## 2024-03-21 NOTE — Assessment & Plan Note (Addendum)
 03/21/24 continue eliquis  for now.  Neurology to decide if risk hemorrhagic conversion is high enough to warrant cessation of Eliquis .  03/22/24 will continue Eliquis  for now until neurology says otherwise.  03/23/24 stable   03/24/24 continue eliquis .  03/25/24 continue eliquis  5 mg bid.  03/26/24 DC to SNF with Eliquis  5 mg bid.

## 2024-03-21 NOTE — Assessment & Plan Note (Addendum)
 03/21/24 continue with Eliquis  for now. Neurology to decide if risk hemorrhagic conversion is high enough to warrant cessation of Eliquis .  03/22/24 will continue Eliquis  for now until neurology says otherwise.  03/23/24 stable  03/24/24 continue eliquis . Restart coreg .  03/25/24 continue eliquis  5 mg bid. Coreg  3.125 mg bid.  03/26/24 DC to SNF with eliquis  5 mg bid, coreg  3.125 mg bid.

## 2024-03-21 NOTE — Assessment & Plan Note (Addendum)
 03/21/24 confirmed with wife and son that pt would want to be a DNR. DNR form completed

## 2024-03-21 NOTE — Progress Notes (Signed)
 Remote PPM Transmission

## 2024-03-21 NOTE — ED Notes (Signed)
 Pt removed BP cuff. Pt wondering what is going on. RN updated pt on location, time, and situation. Pt nodded head and went back to sleep.

## 2024-03-21 NOTE — Assessment & Plan Note (Addendum)
 03/21/24 chronic.  03/22/24 stable  03/23/24 stable  03/24/24 stable  03/25/24 stable. Chronic. Has MRI compatible PPM.   Pulse Gen Model: [id number] Azure XT DR MRI     03/26/24 stable. Chronic. PPM and leads are MRI compatible.

## 2024-03-21 NOTE — ED Notes (Signed)
 CCMD added pt to monitor

## 2024-03-21 NOTE — ED Notes (Signed)
 Pt bedding soiled with urine, changed bedding placed pt on clan brief and gown, adjusted in bed

## 2024-03-21 NOTE — ED Notes (Signed)
 Pt resting comfortably in bed. RN noted chest rise and fall.

## 2024-03-21 NOTE — Progress Notes (Signed)
 PROGRESS NOTE    Thomas [name]  FMW:[id number] DOB: [birth year] DOA: 03/20/2024 PCP: Kennyth Worth HERO, MD  Subjective: Pt seen and examined. Met with pt's wife Thomas [name] and son Thomas [name] at bedside. Brain MRI showed acute CVA. Neurology consulted.   Hospital Course: CC: AMS HPI: Thomas [name] is a 89 y.o. male with medical history significant for type 2 diabetes, hypertension, hypothyroidism, paroxysmal A-fib on Eliquis , sick sinus syndrome status post pacemaker placement, who presents to the ER due to altered mental status, generalized weakness since yesterday.  The patient uses a walker at baseline.  Today he was too weak to get out of bed.  Also with waxing and waning confusion.  Has had some urinary retention.  Denies dysuria or subjective fevers.   In the ER, tachypneic, alert and confused.  CT head and CT cervical spine were nonacute.  UA negative for pyuria.  Chest x-ray with no active disease.   ED Course: Temperature 97.5.  BP 141/76, pulse 70, respiratory rate 21, O2 saturation at 99% on room air.  Significant Events: Admitted 03/20/2024 for altered mental status   Admission Labs: VBG pH 7.423, PCO2 of 39 WBC 11, HgB 11.1, plt 152. MCV 103 NH3 of 18 Na 137, K 4.5, CO@ of 23, BUN 18, Scr 1.12, glu 136 T. Prot 6.5, Alb 3.3, AST 19, ALT 13, alk phos 47, t. Bili 0.3 UA negative  Admission Imaging Studies: CXR No active disease  CT head Extensive chronic white matter disease. Old right occipital infarct.  No acute abnormality. CT c-spine No evidence of acute cervical spine fracture, traumatic subluxation or static signs of instability. 2. Multilevel cervical spondylosis as described CTPA No evidence of pulmonary embolus. No acute cardiopulmonary disease. 4.2 cm ascending thoracic aortic aneurysm. Recommend annual imaging followup by CTA or MRA CT abd/pelvis Cholelithiasis.  No CT evidence of acute cholecystitis. Diffuse colonic diverticulosis.  No active diverticulitis. Aortic  atherosclerosis. Air within the urinary bladder, presumably from recent catheterization.  Significant Labs:   Significant Imaging Studies: MRI brain 2 cm acute left thalamic infarct. 2. Small acute to subacute left frontal lobe infarcts. 3. Severe chronic small vessel ischemic disease with multiple chronic infarcts as above. 4. Abnormal distal right vertebral artery which may reflect occlusion or slow flow  Antibiotic Therapy: Anti-infectives (From admission, onward)    None       Procedures:   Consultants:     Assessment and Plan: * Acute CVA (cerebrovascular accident) ([name]) 03/21/24 MRI brain showed acute CVA of 2 cm acute left thalamic infarct and Small acute to subacute left frontal lobe infarcts. Neurology consulted.   History of pulmonary embolism 03/21/24 continue eliquis  for now.  Neurology to decide if risk hemorrhagic conversion is high enough to warrant cessation of Eliquis .    PAF (paroxysmal atrial fibrillation) ([name]) 03/21/24 continue with Eliquis  for now. Neurology to decide if risk hemorrhagic conversion is high enough to warrant cessation of Eliquis .   DNR (do not resuscitate) 03/21/24 confirmed with wife and son that pt would want to be a DNR. DNR form completed       Type 2 diabetes mellitus with hyperglycemia ([name]) 03/21/24 check A1c. Add SSI.   Cardiac pacemaker 03/21/24 chronic.   Hypertension associated with diabetes ([name]) 03/21/24 allow for permissive HTN for now.   Hypothyroidism 03/21/24 stable. Continue synthroid .   DVT prophylaxis:  apixaban  (ELIQUIS ) tablet 5 mg     Code Status: Limited: Do not attempt resuscitation (DNR) -DNR-LIMITED -Do Not Intubate/DNI  Family Communication:  discussed with wife and son at bedside Disposition Plan: unknown Reason for continuing need for hospitalization: undergoing CVA workup.  Objective: Vitals:   03/21/24 1400 03/21/24 1415 03/21/24 1438 03/21/24 1520  BP: (!) 117/55   (!) 100/46   Pulse: 69 69  69  Resp: (!) 29 19  16   Temp:   98.2 F (36.8 C) 98.5 F (36.9 C)  TempSrc:   Axillary Oral  SpO2: 100% 100%  94%    Intake/Output Summary (Last 24 hours) at 03/21/2024 1636 Last data filed at 03/21/2024 0016 Gross per 24 hour  Intake 900 ml  Output --  Net 900 ml   There were no vitals filed for this visit.  Examination:  Physical Exam Vitals and nursing note reviewed.  Constitutional:      Comments: sleeping  Cardiovascular:     Rate and Rhythm: Normal rate and regular rhythm.  Pulmonary:     Effort: Pulmonary effort is normal.     Breath sounds: Normal breath sounds.  Abdominal:     General: Bowel sounds are normal. There is no distension.  Skin:    General: Skin is warm and dry.     Capillary Refill: Capillary refill takes less than 2 seconds.     Data Reviewed: I have personally reviewed following labs and imaging studies  CBC: Recent Labs  Lab 03/20/24 1440 03/20/24 1507 03/20/24 1508  WBC 11.0*  --   --   NEUTROABS 6.6  --   --   HGB 11.1* 10.5* 10.5*  HCT 34.6* 31.0* 31.0*  MCV 103.3*  --   --   PLT 152  --   --    Basic Metabolic Panel: Recent Labs  Lab 03/20/24 1440 03/20/24 1507 03/20/24 1508  NA 137 140 140  K 4.5 4.6 4.6  CL 105 107  --   CO2 23  --   --   GLUCOSE 136* 138*  --   BUN 18 25*  --   CREATININE 1.12 1.30*  --   CALCIUM  8.4*  --   --    GFR: CrCl cannot be calculated (Unknown ideal weight.). Liver Function Tests: Recent Labs  Lab 03/20/24 1440  AST 19  ALT 13  ALKPHOS 47  BILITOT 0.3  PROT 6.5  ALBUMIN 3.3*   Recent Labs  Lab 03/20/24 1441  AMMONIA 18    Recent Results (from the past 240 hours)  Urine Culture     Status: None   Collection Time: 03/20/24  4:32 PM   Specimen: Urine, Clean Catch  Result Value Ref Range Status   Specimen Description URINE, CLEAN CATCH  Final   Special Requests NONE  Final   Culture   Final    NO GROWTH Performed at [facility], [address], [place], [place] [address]    Report Status 03/21/2024 FINAL  Final     Radiology Studies: MR BRAIN WO CONTRAST Result Date: 03/21/2024 CLINICAL DATA:  Neuro deficit, acute, stroke suspected. Altered mental status. EXAM: MRI HEAD WITHOUT CONTRAST TECHNIQUE: Multiplanar, multiecho pulse sequences of the brain and surrounding structures were obtained without intravenous contrast. COMPARISON:  Head CT 03/20/2024 and MRI 09/01/2020 FINDINGS: Brain: There is a 2 cm acute infarct involving the ventral left thalamus and internal capsule. A few subcentimeter foci of milder restricted diffusion involving cortex and white matter in the left frontal lobes are suggestive of acute to subacute infarcts. Confluent T2 hyperintensities in the cerebral white matter bilaterally are similar in  extent to the prior MRI and are nonspecific but compatible with severe chronic small vessel ischemic disease. Small chronic cortical and subcortical infarcts are noted in the right temporal and right occipital lobes. There are chronic lacunar infarcts in the right basal ganglia, thalami, and both cerebellar hemispheres with many of the cerebellar infarcts being new from the prior MRI. Chronic small-vessel changes in the pons have also slightly progressed from the prior MRI. A chronic microhemorrhage is noted in the right cerebellar hemisphere. There is mild cerebral atrophy. No mass, midline shift, or extra-axial fluid collection is identified. Vascular: Abnormal appearance of the distal right vertebral artery, partially chronic but progressed from 2022 and which may reflect occlusion or slow flow. Skull and upper cervical spine: No suspicious marrow lesion. Partially visualized postoperative and degenerative changes in the cervical spine. Sinuses/Orbits: Bilateral cataract extraction. Small mucous retention cyst in the right maxillary sinus. No significant mastoid fluid. Other: None. IMPRESSION: 1. 2 cm acute left thalamic infarct. 2.  Small acute to subacute left frontal lobe infarcts. 3. Severe chronic small vessel ischemic disease with multiple chronic infarcts as above. 4. Abnormal distal right vertebral artery which may reflect occlusion or slow flow. Electronically Signed   By: Dasie Hamburg M.D.   On: 03/21/2024 12:57   CT ABDOMEN PELVIS W CONTRAST Result Date: 03/20/2024 CLINICAL DATA:  Epigastric pain, altered mental status. EXAM: CT ABDOMEN AND PELVIS WITH CONTRAST TECHNIQUE: Multidetector CT imaging of the abdomen and pelvis was performed using the standard protocol following bolus administration of intravenous contrast. RADIATION DOSE REDUCTION: This exam was performed according to the departmental dose-optimization program which includes automated exposure control, adjustment of the mA and/or kV according to patient size and/or use of iterative reconstruction technique. CONTRAST:  80mL OMNIPAQUE  IOHEXOL  350 MG/ML SOLN COMPARISON:  None Available. FINDINGS: Lower chest: See chest CT report. Hepatobiliary: Layering gallstones within the gallbladder. No biliary ductal dilatation. No focal hepatic abnormality. Pancreas: No focal abnormality or ductal dilatation. Spleen: No focal abnormality.  Normal size. Adrenals/Urinary Tract: Air within the urinary bladder, presumably from recent catheterization. Recommend clinical correlation. No hydronephrosis. No suspicious renal or adrenal abnormality. Stomach/Bowel: Diffuse colonic diverticulosis. No active diverticulitis. No bowel obstruction or inflammatory process. Vascular/Lymphatic: Aortic atherosclerosis. No evidence of aneurysm or adenopathy. Reproductive: No visible focal abnormality. Other: No free fluid or free air. Musculoskeletal: No acute bony abnormality. IMPRESSION: Cholelithiasis.  No CT evidence of acute cholecystitis. Diffuse colonic diverticulosis.  No active diverticulitis. Aortic atherosclerosis. Air within the urinary bladder, presumably from recent catheterization.  Recommend clinical correlation. Electronically Signed   By: Franky Crease M.D.   On: 03/20/2024 21:34   CT Angio Chest PE W/Cm &/Or Wo Cm Result Date: 03/20/2024 CLINICAL DATA:  Pulmonary embolism (PE) suspected, high prob. Altered mental status. EXAM: CT ANGIOGRAPHY CHEST WITH CONTRAST TECHNIQUE: Multidetector CT imaging of the chest was performed using the standard protocol during bolus administration of intravenous contrast. Multiplanar CT image reconstructions and MIPs were obtained to evaluate the vascular anatomy. RADIATION DOSE REDUCTION: This exam was performed according to the departmental dose-optimization program which includes automated exposure control, adjustment of the mA and/or kV according to patient size and/or use of iterative reconstruction technique. CONTRAST:  80mL OMNIPAQUE  IOHEXOL  350 MG/ML SOLN COMPARISON:  None Available. FINDINGS: Cardiovascular: Heart is normal size. Prior CABG. Mild aneurysmal dilatation of the ascending thoracic aorta measuring 4.2 cm. Aortic atherosclerosis. No dissection. No filling defects in the pulmonary arteries to suggest pulmonary emboli. Mediastinum/Nodes: No mediastinal, hilar, or axillary  adenopathy. Trachea and esophagus are unremarkable. Thyroid  unremarkable. Moderate-sized hiatal hernia. Lungs/Pleura: No confluent airspace opacities or effusions. Upper Abdomen: Small layering gallstones within the gallbladder. No acute findings. Musculoskeletal: Chest wall soft tissues are unremarkable. No acute bony abnormality. Review of the MIP images confirms the above findings. IMPRESSION: No evidence of pulmonary embolus. No acute cardiopulmonary disease. 4.2 cm ascending thoracic aortic aneurysm. Recommend annual imaging followup by CTA or MRA. This recommendation follows 2010 ACCF/AHA/AATS/ACR/ASA/SCA/SCAI/SIR/STS/SVM Guidelines for the Diagnosis and Management of Patients with Thoracic Aortic Disease. Circulation. 2010; 121: Z733-z630. Aortic aneurysm NOS  (ICD10-I71.9) Cholelithiasis. Aortic Atherosclerosis (ICD10-I70.0). Electronically Signed   By: Franky Crease M.D.   On: 03/20/2024 21:31   CT Head Wo Contrast Result Date: 03/20/2024 CLINICAL DATA:  Altered mental status EXAM: CT HEAD WITHOUT CONTRAST TECHNIQUE: Contiguous axial images were obtained from the base of the skull through the vertex without intravenous contrast. RADIATION DOSE REDUCTION: This exam was performed according to the departmental dose-optimization program which includes automated exposure control, adjustment of the mA and/or kV according to patient size and/or use of iterative reconstruction technique. COMPARISON:  MRI September 01, 2020 FINDINGS: CT HEAD: There is extensive chronic low attenuation in the cerebral white matter. Old right occipital infarct. There is no hemorrhage. No acute ischemic changes. No mass lesion. The ventricles are normal. Skull/sinuses/orbits: No significant abnormality. IMPRESSION: Extensive chronic white matter disease. Old right occipital infarct. No acute abnormality. Electronically Signed   By: Nancyann Burns M.D.   On: 03/20/2024 16:13   CT Cervical Spine Wo Contrast Result Date: 03/20/2024 CLINICAL DATA:  Neck trauma. Altered mental status since yesterday. Urinary retention. EXAM: CT CERVICAL SPINE WITHOUT CONTRAST TECHNIQUE: Multidetector CT imaging of the cervical spine was performed without intravenous contrast. Multiplanar CT image reconstructions were also generated. RADIATION DOSE REDUCTION: This exam was performed according to the departmental dose-optimization program which includes automated exposure control, adjustment of the mA and/or kV according to patient size and/or use of iterative reconstruction technique. COMPARISON:  CT cervical spine 10/10/2021. FINDINGS: Alignment: Exaggerated thoracic kyphosis with similar mild stepwise degenerative anterolisthesis at C4-5, C5-6, C6-7 and C7-T1. Skull base and vertebrae: No evidence of acute cervical spine  fracture or traumatic subluxation. Postsurgical changes from C3-4 ACDF. There is multilevel spondylosis with partially bridging anterior osteophytes from C5 through T1. Multilevel facet arthropathy. Soft tissues and spinal canal: No prevertebral fluid or swelling. No visible canal hematoma. Disc levels: Multilevel spondylosis with uncinate spurring and facet hypertrophy. There are advanced degenerative changes at the lateral C1-2 articulations, greater on the left. There is multilevel mild-to-moderate foraminal narrowing which appears chronic. Upper chest: Clear lung apices. Left subclavian pacemaker leads noted. Other: None. IMPRESSION: 1. No evidence of acute cervical spine fracture, traumatic subluxation or static signs of instability. 2. Multilevel cervical spondylosis as described. Electronically Signed   By: Elsie Perone M.D.   On: 03/20/2024 16:05   DG Chest Portable 1 View Result Date: 03/20/2024 CLINICAL DATA:  Altered mental status. EXAM: PORTABLE CHEST 1 VIEW COMPARISON:  10/10/2021. FINDINGS: Low lung volume. Mildly elevated right hemidiaphragm. Bilateral lung fields are clear. Bilateral costophrenic angles are clear. Stable cardio-mediastinal silhouette. There is a left sided 2-lead pacemaker. There are surgical staples along the heart border and sternotomy wires, status post CABG (coronary artery bypass graft). No acute osseous abnormalities. Multiple old healed left rib fractures noted. The soft tissues are within normal limits. IMPRESSION: No active disease. Electronically Signed   By: Ree Molt M.D.   On: 03/20/2024  15:27    Scheduled Meds:  [START ON 03/22/2024]  stroke: early stages of recovery book   Does not apply Once   apixaban   5 mg Oral BID   levothyroxine   112 mcg Oral Q0600   Continuous Infusions:   LOS: 0 days   Time spent: 60 minutes  Camellia Door, DO  [facility]  03/21/2024, 4:36 PM

## 2024-03-22 ENCOUNTER — Other Ambulatory Visit: Payer: Self-pay | Admitting: Family Medicine

## 2024-03-22 ENCOUNTER — Observation Stay (HOSPITAL_COMMUNITY)

## 2024-03-22 ENCOUNTER — Other Ambulatory Visit: Payer: Self-pay

## 2024-03-22 DIAGNOSIS — I6389 Other cerebral infarction: Secondary | ICD-10-CM

## 2024-03-22 DIAGNOSIS — I69391 Dysphagia following cerebral infarction: Secondary | ICD-10-CM

## 2024-03-22 DIAGNOSIS — Z79899 Other long term (current) drug therapy: Secondary | ICD-10-CM

## 2024-03-22 DIAGNOSIS — I6350 Cerebral infarction due to unspecified occlusion or stenosis of unspecified cerebral artery: Secondary | ICD-10-CM | POA: Diagnosis not present

## 2024-03-22 DIAGNOSIS — E1122 Type 2 diabetes mellitus with diabetic chronic kidney disease: Secondary | ICD-10-CM | POA: Diagnosis present

## 2024-03-22 DIAGNOSIS — Z8249 Family history of ischemic heart disease and other diseases of the circulatory system: Secondary | ICD-10-CM | POA: Diagnosis not present

## 2024-03-22 DIAGNOSIS — E785 Hyperlipidemia, unspecified: Secondary | ICD-10-CM | POA: Diagnosis present

## 2024-03-22 DIAGNOSIS — E1159 Type 2 diabetes mellitus with other circulatory complications: Secondary | ICD-10-CM | POA: Diagnosis present

## 2024-03-22 DIAGNOSIS — R1310 Dysphagia, unspecified: Secondary | ICD-10-CM | POA: Diagnosis present

## 2024-03-22 DIAGNOSIS — I2510 Atherosclerotic heart disease of native coronary artery without angina pectoris: Secondary | ICD-10-CM | POA: Diagnosis present

## 2024-03-22 DIAGNOSIS — I639 Cerebral infarction, unspecified: Secondary | ICD-10-CM

## 2024-03-22 DIAGNOSIS — I152 Hypertension secondary to endocrine disorders: Secondary | ICD-10-CM

## 2024-03-22 DIAGNOSIS — R4182 Altered mental status, unspecified: Secondary | ICD-10-CM | POA: Diagnosis present

## 2024-03-22 DIAGNOSIS — I7090 Unspecified atherosclerosis: Secondary | ICD-10-CM

## 2024-03-22 DIAGNOSIS — Z950 Presence of cardiac pacemaker: Secondary | ICD-10-CM

## 2024-03-22 DIAGNOSIS — I495 Sick sinus syndrome: Secondary | ICD-10-CM | POA: Diagnosis present

## 2024-03-22 DIAGNOSIS — I25119 Atherosclerotic heart disease of native coronary artery with unspecified angina pectoris: Secondary | ICD-10-CM

## 2024-03-22 DIAGNOSIS — E890 Postprocedural hypothyroidism: Secondary | ICD-10-CM | POA: Diagnosis present

## 2024-03-22 DIAGNOSIS — I480 Paroxysmal atrial fibrillation: Secondary | ICD-10-CM

## 2024-03-22 DIAGNOSIS — Z7901 Long term (current) use of anticoagulants: Secondary | ICD-10-CM | POA: Diagnosis not present

## 2024-03-22 DIAGNOSIS — E1165 Type 2 diabetes mellitus with hyperglycemia: Secondary | ICD-10-CM | POA: Diagnosis present

## 2024-03-22 DIAGNOSIS — Z79890 Hormone replacement therapy (postmenopausal): Secondary | ICD-10-CM | POA: Diagnosis not present

## 2024-03-22 DIAGNOSIS — I700 Atherosclerosis of aorta: Secondary | ICD-10-CM | POA: Diagnosis present

## 2024-03-22 DIAGNOSIS — Z66 Do not resuscitate: Secondary | ICD-10-CM

## 2024-03-22 DIAGNOSIS — N1832 Chronic kidney disease, stage 3b: Secondary | ICD-10-CM | POA: Diagnosis present

## 2024-03-22 DIAGNOSIS — R29705 NIHSS score 5: Secondary | ICD-10-CM | POA: Diagnosis present

## 2024-03-22 DIAGNOSIS — G9341 Metabolic encephalopathy: Secondary | ICD-10-CM | POA: Diagnosis present

## 2024-03-22 DIAGNOSIS — Z794 Long term (current) use of insulin: Secondary | ICD-10-CM | POA: Diagnosis not present

## 2024-03-22 DIAGNOSIS — E1151 Type 2 diabetes mellitus with diabetic peripheral angiopathy without gangrene: Secondary | ICD-10-CM | POA: Diagnosis not present

## 2024-03-22 DIAGNOSIS — I63542 Cerebral infarction due to unspecified occlusion or stenosis of left cerebellar artery: Secondary | ICD-10-CM | POA: Diagnosis present

## 2024-03-22 DIAGNOSIS — G4720 Circadian rhythm sleep disorder, unspecified type: Secondary | ICD-10-CM | POA: Diagnosis present

## 2024-03-22 DIAGNOSIS — I6381 Other cerebral infarction due to occlusion or stenosis of small artery: Secondary | ICD-10-CM | POA: Diagnosis not present

## 2024-03-22 DIAGNOSIS — R29706 NIHSS score 6: Secondary | ICD-10-CM | POA: Diagnosis not present

## 2024-03-22 DIAGNOSIS — R29703 NIHSS score 3: Secondary | ICD-10-CM | POA: Diagnosis not present

## 2024-03-22 DIAGNOSIS — E039 Hypothyroidism, unspecified: Secondary | ICD-10-CM | POA: Diagnosis not present

## 2024-03-22 DIAGNOSIS — I951 Orthostatic hypotension: Secondary | ICD-10-CM

## 2024-03-22 LAB — ECHOCARDIOGRAM COMPLETE
Area-P 1/2: 2.58 cm2
Height: 68 in
S' Lateral: 2.2 cm
Weight: 2793.67 [oz_av]

## 2024-03-22 LAB — LIPID PANEL
Cholesterol: 102 mg/dL (ref 0–200)
HDL: 34 mg/dL — ABNORMAL LOW (ref >40)
LDL Cholesterol: 43 mg/dL (ref 0–99)
Total CHOL/HDL Ratio: 3 ratio
Triglycerides: 124 mg/dL (ref <150)
VLDL: 25 mg/dL (ref 0–40)

## 2024-03-22 LAB — HEMOGLOBIN A1C
Hgb A1c MFr Bld: 8.1 % — ABNORMAL HIGH (ref 4.8–5.6)
Mean Plasma Glucose: 185.77 mg/dL

## 2024-03-22 NOTE — NC FL2 (Signed)
 [place]  MEDICAID FL2 LEVEL OF CARE FORM     IDENTIFICATION  [name] Birthdate: May 25, 1929 Sex: male Admission Date (Current Location): 03/20/2024  [facility] and [place] Number:  Producer, television/film/video and Address:  [facility] [place], [address] [place], [place] [address]      Provider Number: [id number]  Attending Physician Name and Address:  Laurence Locus, DO  Relative Name and Phone Number:  Garreth, [place] Spouse   [phone]    Current Level of Care: Hospital Recommended Level of Care: Skilled Nursing Facility Prior Approval Number:    Date Approved/Denied:   PASRR Number: [id number]  Discharge Plan: SNF    Current Diagnoses: Patient Active Problem List   Diagnosis Date Noted   DNR (do not resuscitate) 03/21/2024   PAF (paroxysmal atrial fibrillation) ([name]) 03/21/2024   History of pulmonary embolism 03/21/2024   Actinic keratosis 02/06/2024   Seborrheic dermatitis 02/06/2024   Heel callus 02/14/2023   Onychomycosis 07/30/2022   Constipation 07/30/2022   Edema of foot 04/22/2022   Clotting disorder 03/16/2021   Orthostatic hypotension 10/01/2020   Normocytic anemia 08/31/2020   Prolonged QT interval 08/31/2020   Acute CVA (cerebrovascular accident) ([name]) 08/31/2020   Debility 08/21/2020   Lytic bone lesion of femur 08/21/2020   Sick sinus syndrome ([name]) 07/31/2020   Coronary artery disease s/p CABG 2001 07/17/2020   Hypothyroidism 07/17/2020   Hypertension associated with diabetes ([name]) 07/17/2020   Cardiac pacemaker 07/17/2020   Osteoarthritis 07/17/2020   Type 2 diabetes mellitus with hyperglycemia ([name]) 07/17/2020   Vitamin D  deficiency 07/17/2020   Macular degeneration 07/17/2020   Dyslipidemia due to type 2 diabetes mellitus ([name]) 07/17/2020   Insomnia 07/17/2020   BPH (benign prostatic hyperplasia) 07/17/2020   Colon polyps 07/17/2020    Orientation RESPIRATION BLADDER Height & Weight     Self   Normal Incontinent Weight: 174 lb 9.7 oz (79.2 kg) Height:  5' 8 (172.7 cm)  BEHAVIORAL SYMPTOMS/MOOD NEUROLOGICAL BOWEL NUTRITION STATUS      Incontinent Diet (see discharge summary)  AMBULATORY STATUS COMMUNICATION OF NEEDS Skin   Total Care Verbally Other (Comment) (ecchymosis)                       Personal Care Assistance Level of Assistance  Bathing, Feeding, Dressing, Total care Bathing Assistance: Maximum assistance Feeding assistance: Limited assistance Dressing Assistance: Maximum assistance Total Care Assistance: Maximum assistance   Functional Limitations Info  Sight, Hearing, Speech Sight Info: Adequate Hearing Info: Impaired Speech Info: Adequate    SPECIAL CARE FACTORS FREQUENCY  PT (By licensed PT), OT (By licensed OT)     PT Frequency: 5x week OT Frequency: 5x week            Contractures Contractures Info: Not present    Additional Factors Info  Code Status, Allergies Code Status Info: DNR Allergies Info: Amitriptyline, Lyrica (Pregabalin), Pravachol (Pravastatin), Effexor Xr (Venlafaxine), Morphine, Zolpidem           Current Medications (03/22/2024):  This is the current hospital active medication list Current Facility-Administered Medications  Medication Dose Route Frequency Provider Last Rate Last Admin    stroke: early stages of recovery book   Does not apply Once Waddell Karna LABOR, NP       acetaminophen  (TYLENOL ) tablet 650 mg  650 mg Oral Q6H PRN Shona Laurence N, DO       amiodarone  (PACERONE ) tablet 100 mg  100 mg Oral Daily Laurence,  Eric, DO   100 mg at 03/21/24 1905   apixaban  (ELIQUIS ) tablet 5 mg  5 mg Oral BID Shona Laurence N, DO   5 mg at 03/21/24 2354   levothyroxine  (SYNTHROID ) tablet 112 mcg  112 mcg Oral Q0600 Shona Laurence SAILOR, DO   112 mcg at 03/22/24 0631   melatonin tablet 5 mg  5 mg Oral QHS PRN Hall, Carole N, DO   5 mg at 03/21/24 2354   polyethylene glycol (MIRALAX  / GLYCOLAX ) packet 17 g  17 g Oral Daily PRN Shona Laurence SAILOR,  DO       prochlorperazine  (COMPAZINE ) injection 5 mg  5 mg Intravenous Q6H PRN Shona Laurence N, DO       rosuvastatin  (CRESTOR ) tablet 20 mg  20 mg Oral QHS Laurence Locus, DO   20 mg at 03/21/24 2354   tamsulosin  (FLOMAX ) capsule 0.8 mg  0.8 mg Oral QPC supper Laurence Locus, DO   0.8 mg at 03/21/24 8094     Discharge Medications: Please see discharge summary for a list of discharge medications.  Relevant Imaging Results:  Relevant Lab Results:   Additional Information SSN: [id number]  Bridget Cordella Simmonds, LCSW

## 2024-03-22 NOTE — Progress Notes (Signed)
 Physical Therapy Treatment Patient Details Name: Thomas [name] MRN: [id number] DOB: Jul 17, 1928 Today's Date: 03/22/2024   History of Present Illness Thomas [name] is a 89 y.o. male  who presents to the ER due to altered mental status and generalized weakness.  The patient uses a walker at baseline. PMH: type 2 diabetes, hypertension, hypothyroidism, paroxysmal A-fib on Eliquis , sick sinus syndrome status post pacemaker placement    PT Comments  Pt resting in bed on arrival, agreeable to session and with good progress towards acute goals. Pt progressing OOB transfers this session, able to rise to stand x2 with mod A fading to min A and step pivot EOB>chair with RW for support and min A to maintain balance. Pt able to perform standing pre-gait activities with cues for technique and posture as pt with tendency for a forward flexed posture in standing and sitting. Pt up in chair at end of session with family present at bedside. Patient will benefit from continued inpatient follow up therapy, <3 hours/day, will continue to follow acutely.    If plan is discharge home, recommend the following: A little help with walking and/or transfers;A little help with bathing/dressing/bathroom;Assistance with cooking/housework;Help with stairs or ramp for entrance;Assist for transportation;Supervision due to cognitive status   Can travel by private vehicle     No  Equipment Recommendations  None recommended by PT    Recommendations for Other Services       Precautions / Restrictions Precautions Precautions: Fall Recall of Precautions/Restrictions: Impaired Restrictions Weight Bearing Restrictions Per Provider Order: No     Mobility  Bed Mobility Overal bed mobility: Needs Assistance Bed Mobility: Supine to Sit, Sit to Supine     Supine to sit: Mod assist     General bed mobility comments: mod A to bring LEs to EOB and to elevate trunk, pt able to follow sequencing commands     Transfers Overall transfer level: Needs assistance Equipment used: Rolling walker (2 wheels) Transfers: Sit to/from Stand, Bed to chair/wheelchair/BSC Sit to Stand: Mod assist, Min assist   Step pivot transfers: Min assist       General transfer comment: mod A initially fading to min A on second stand, cues for hand placement with pt continuing to pull up on RW, min A to steady and manage RW to step pivot to chair    Ambulation/Gait             Pre-gait activities: standing marching x10 with RW support with cues for upright posture     Stairs             Wheelchair Mobility     Tilt Bed    Modified Rankin (Stroke Patients Only)       Balance Overall balance assessment: Needs assistance Sitting-balance support: Bilateral upper extremity supported, Feet supported Sitting balance-Leahy Scale: Fair Sitting balance - Comments: able to maintain without assist   Standing balance support: Bilateral upper extremity supported, During functional activity Standing balance-Leahy Scale: Poor Standing balance comment: reliant on RW support                            Communication Communication Communication: Impaired Factors Affecting Communication: Hearing impaired  Cognition Arousal: Alert Behavior During Therapy: Flat affect   PT - Cognitive impairments: Sequencing, Problem solving, Safety/Judgement, Memory, Awareness, Orientation   Orientation impairments: Place, Time, Situation  Following commands: Impaired Following commands impaired: Follows one step commands with increased time    Cueing Cueing Techniques: Verbal cues, Gestural cues, Tactile cues  Exercises General Exercises - Lower Extremity Long Arc Quad: AROM, Right, Left, 10 reps, Seated Hip Flexion/Marching: AROM, Right, Left, 20 reps, Seated, Standing    General Comments General comments (skin integrity, edema, etc.): VSS on RA, pt spouse and son  present and supportive throughout session      Pertinent Vitals/Pain Pain Assessment Pain Assessment: No/denies pain    Home Living                          Prior Function            PT Goals (current goals can now be found in the care plan section) Acute Rehab PT Goals PT Goal Formulation: Patient unable to participate in goal setting Time For Goal Achievement: 04/04/24 Progress towards PT goals: Progressing toward goals    Frequency    Min 2X/week      PT Plan      Co-evaluation              AM-PAC PT 6 Clicks Mobility   Outcome Measure  Help needed turning from your back to your side while in a flat bed without using bedrails?: A Lot Help needed moving from lying on your back to sitting on the side of a flat bed without using bedrails?: A Lot Help needed moving to and from a bed to a chair (including a wheelchair)?: A Lot Help needed standing up from a chair using your arms (e.g., wheelchair or bedside chair)?: A Lot Help needed to walk in hospital room?: Total Help needed climbing 3-5 steps with a railing? : Total 6 Click Score: 10    End of Session Equipment Utilized During Treatment: Gait belt Activity Tolerance: Patient limited by fatigue Patient left: with call bell/phone within reach;in chair;with family/visitor present Nurse Communication: Mobility status;Need for lift equipment (+2 for back to bed) PT Visit Diagnosis: Muscle weakness (generalized) (M62.81);Other abnormalities of gait and mobility (R26.89)     Time: 8557-8494 PT Time Calculation (min) (ACUTE ONLY): 23 min  Charges:    $Gait Training: 8-22 mins $Therapeutic Exercise: 8-22 mins PT General Charges $$ ACUTE PT VISIT: 1 Visit                     Thomas [name] R. PTA Acute Rehabilitation Services Office: [phone]   Therisa CHRISTELLA Boor 03/22/2024, 3:57 PM

## 2024-03-22 NOTE — Plan of Care (Signed)

## 2024-03-22 NOTE — Assessment & Plan Note (Addendum)
 03/22/24 due to CVA. Not from infection. Resolved.

## 2024-03-22 NOTE — Care Management Obs Status (Signed)
 MEDICARE OBSERVATION STATUS NOTIFICATION   Patient Details  Name: Thomas [name] MRN: [id number] Date of Birth: 1929-05-26   Medicare Observation Status Notification Given:  Yes    Jon Cruel 03/22/2024, 10:04 AM

## 2024-03-22 NOTE — TOC Initial Note (Signed)
 Transition of Care [facility]) - Initial/Assessment Note    Patient Details  Name: Thomas [name] MRN: [id number] Date of Birth: 04-Oct-1928  [facility]) CM/SW Contact:    Bridget Cordella Simmonds, LCSW Phone Number: 03/22/2024, 10:52 AM  Clinical Narrative:   Pt oriented x1, asleep, did not wake him.  CSW spoke with son Selinda (goes by [employer]) and wife Reena (goes by [place]), both in room.  Discussed PT recommendation for SNF, they are in agreement, possibly interested in [place] or [place].  [place] choice document provided.  Pt from home with wife, just finished HH services, no current home services.  Referral sent out in hub for SNF.                Expected Discharge Plan: Skilled Nursing Facility Barriers to Discharge: Continued Medical Work up, SNF Pending bed offer   Patient Goals and CMS Choice   CMS Medicare.gov Compare Post Acute Care list provided to:: Patient Represenative (must comment) (wife and son) Choice offered to / list presented to : Spouse, Adult Children      Expected Discharge Plan and Services In-house Referral: Clinical Social Work   Post Acute Care Choice: Skilled Nursing Facility Living arrangements for the past 2 months: Single Family Home                                      Prior Living Arrangements/Services Living arrangements for the past 2 months: Single Family Home Lives with:: Spouse Patient language and need for interpreter reviewed:: No        Need for Family Participation in Patient Care: Yes (Comment) Care giver support system in place?: Yes (comment) Current home services: Other (comment) (HH recently completed) Criminal Activity/Legal Involvement Pertinent to Current Situation/Hospitalization: No - Comment as needed  Activities of Daily Living   ADL Screening (condition at time of admission) Independently performs ADLs?: No Does the patient have a NEW difficulty with bathing/dressing/toileting/self-feeding that is  expected to last >3 days?: Yes (Initiates electronic notice to provider for possible OT consult) Does the patient have a NEW difficulty with getting in/out of bed, walking, or climbing stairs that is expected to last >3 days?: Yes (Initiates electronic notice to provider for possible PT consult) Does the patient have a NEW difficulty with communication that is expected to last >3 days?: Yes (Initiates electronic notice to provider for possible SLP consult) Is the patient deaf or have difficulty hearing?: Yes Does the patient have difficulty seeing, even when wearing glasses/contacts?: No Does the patient have difficulty concentrating, remembering, or making decisions?: Yes  Permission Sought/Granted                  Emotional Assessment Appearance:: Appears stated age Attitude/Demeanor/Rapport: Unable to Assess Affect (typically observed): Unable to Assess Orientation: : Oriented to Self      Admission diagnosis:  Altered mental status, unspecified altered mental status type [R41.82] Other fatigue [R53.83] AMS (altered mental status) [R41.82] Patient Active Problem List   Diagnosis Date Noted   DNR (do not resuscitate) 03/21/2024   PAF (paroxysmal atrial fibrillation) ([name]) 03/21/2024   History of pulmonary embolism 03/21/2024   Actinic keratosis 02/06/2024   Seborrheic dermatitis 02/06/2024   Heel callus 02/14/2023   Onychomycosis 07/30/2022   Constipation 07/30/2022   Edema of foot 04/22/2022   Clotting disorder 03/16/2021   Orthostatic hypotension 10/01/2020   Normocytic anemia 08/31/2020  Prolonged QT interval 08/31/2020   Acute CVA (cerebrovascular accident) ([name]) 08/31/2020   Debility 08/21/2020   Lytic bone lesion of femur 08/21/2020   Sick sinus syndrome ([name]) 07/31/2020   Coronary artery disease s/p CABG 2001 07/17/2020   Hypothyroidism 07/17/2020   Hypertension associated with diabetes ([name]) 07/17/2020   Cardiac pacemaker 07/17/2020   Osteoarthritis  07/17/2020   Type 2 diabetes mellitus with hyperglycemia ([name]) 07/17/2020   Vitamin D  deficiency 07/17/2020   Macular degeneration 07/17/2020   Dyslipidemia due to type 2 diabetes mellitus ([name]) 07/17/2020   Insomnia 07/17/2020   BPH (benign prostatic hyperplasia) 07/17/2020   Colon polyps 07/17/2020   PCP:  Kennyth Worth HERO, MD Pharmacy:   [facility] [phone] - [place], [place] - [address] [place]  [address] Phone: [phone] Fax: [phone]     Social Drivers of Health (SDOH) Social History: SDOH Screenings   Food Insecurity: No Food Insecurity (03/21/2024)  Housing: Low Risk  (03/21/2024)  Transportation Needs: No Transportation Needs (03/21/2024)  Utilities: Not At Risk (03/21/2024)  Alcohol Screen: Low Risk  (10/10/2023)  Depression (PHQ2-9): Low Risk  (02/06/2024)  Financial Resource Strain: Low Risk  (10/10/2023)  Physical Activity: Insufficiently Active (10/10/2023)  Social Connections: Patient Declined (03/21/2024)  Stress: No Stress Concern Present (10/10/2023)  Tobacco Use: Medium Risk (03/21/2024)  Health Literacy: Adequate Health Literacy (10/10/2023)   SDOH Interventions:     Readmission Risk Interventions     No data to display

## 2024-03-22 NOTE — Progress Notes (Signed)
 PROGRESS NOTE    Thomas [name]  FMW:[id number] DOB: 07-04-1928 DOA: 03/20/2024 PCP: Kennyth Worth HERO, MD  Subjective: Pt seen and examined. Sleeping on/off today. Met with wife and son at bedside. They are in process of choosing SNF facilities to explore. They want to give their selections to CM. CTA head/neck showed no LVO but multiple areas of stenoses.   Hospital Course: CC: AMS HPI: Thomas [name] is a 89 y.o. male with medical history significant for type 2 diabetes, hypertension, hypothyroidism, paroxysmal A-fib on Eliquis , sick sinus syndrome status post pacemaker placement, who presents to the ER due to altered mental status, generalized weakness since yesterday.  The patient uses a walker at baseline.  Today he was too weak to get out of bed.  Also with waxing and waning confusion.  Has had some urinary retention.  Denies dysuria or subjective fevers.   In the ER, tachypneic, alert and confused.  CT head and CT cervical spine were nonacute.  UA negative for pyuria.  Chest x-ray with no active disease.   ED Course: Temperature 97.5.  BP 141/76, pulse 70, respiratory rate 21, O2 saturation at 99% on room air.  Significant Events: Admitted 03/20/2024 for altered mental status   Admission Labs: VBG pH 7.423, PCO2 of 39 WBC 11, HgB 11.1, plt 152. MCV 103 NH3 of 18 Na 137, K 4.5, CO@ of 23, BUN 18, Scr 1.12, glu 136 T. Prot 6.5, Alb 3.3, AST 19, ALT 13, alk phos 47, t. Bili 0.3 UA negative  Admission Imaging Studies: CXR No active disease  CT head Extensive chronic white matter disease. Old right occipital infarct.  No acute abnormality. CT c-spine No evidence of acute cervical spine fracture, traumatic subluxation or static signs of instability. 2. Multilevel cervical spondylosis as described CTPA No evidence of pulmonary embolus. No acute cardiopulmonary disease. 4.2 cm ascending thoracic aortic aneurysm. Recommend annual imaging followup by CTA or MRA CT abd/pelvis  Cholelithiasis.  No CT evidence of acute cholecystitis. Diffuse colonic diverticulosis.  No active diverticulitis. Aortic atherosclerosis. Air within the urinary bladder, presumably from recent catheterization.  Significant Labs:   Significant Imaging Studies: MRI brain 2 cm acute left thalamic infarct. 2. Small acute to subacute left frontal lobe infarcts. 3. Severe chronic small vessel ischemic disease with multiple chronic infarcts as above. 4. Abnormal distal right vertebral artery which may reflect occlusion or slow flow  Antibiotic Therapy: Anti-infectives (From admission, onward)    None       Procedures:   Consultants:     Assessment and Plan: * Acute CVA (cerebrovascular accident) ([name]) 03/21/24 MRI brain showed acute CVA of 2 cm acute left thalamic infarct and Small acute to subacute left frontal lobe infarcts. Neurology consulted.  03/22/24 neurology following. CTA head/neck showed Extensive intracranial atherosclerosis. Remains on Eliquis . Neurology to decide if pt needs antiplatelet therapy.    History of pulmonary embolism 03/21/24 continue eliquis  for now.  Neurology to decide if risk hemorrhagic conversion is high enough to warrant cessation of Eliquis .  03/22/24 will continue Eliquis  for now until neurology says otherwise.     PAF (paroxysmal atrial fibrillation) ([name]) 03/21/24 continue with Eliquis  for now. Neurology to decide if risk hemorrhagic conversion is high enough to warrant cessation of Eliquis .  03/22/24 will continue Eliquis  for now until neurology says otherwise.   DNR (do not resuscitate) 03/21/24 confirmed with wife and son that pt would want to be a DNR. DNR form completed       Type 2  diabetes mellitus with hyperglycemia ([name]) 03/21/24 check A1c. Add SSI.  03/22/24 A1c of 8.1%. could use some better outpatient control of his DM but at age 56 y/o, not sure how much better he can get.   Cardiac pacemaker 03/21/24  chronic.  03/22/24 stable   Hypertension associated with diabetes ([name]) 03/21/24 allow for permissive HTN for now.  03/22/24 continue with permissive HTN for now.  Neurology to decided on when normotension should be achieved. Coreg  still on hold.   Hypothyroidism 03/21/24 stable. Continue synthroid .  03/22/24 stable    AMS (altered mental status) 03/22/24 due to CVA. Not from infection.   DVT prophylaxis:  apixaban  (ELIQUIS ) tablet 5 mg     Code Status: Limited: Do not attempt resuscitation (DNR) -DNR-LIMITED -Do Not Intubate/DNI  Family Communication: discussed with pt's wife Diane and son [name] at bedside Disposition Plan: SNF Reason for continuing need for hospitalization: medically stable.  Objective: Vitals:   03/21/24 1520 03/21/24 1959 03/22/24 0422 03/22/24 0853  BP: (!) 100/46 (!) 129/49 (!) 122/96 (!) 97/56  Pulse: 69 69 69 69  Resp: 16 18 20 16   Temp: 98.5 F (36.9 C) 98.5 F (36.9 C) 97.6 F (36.4 C) 97.6 F (36.4 C)  TempSrc: Oral Oral Oral Oral  SpO2: 94% 100% 98% 95%  Weight:  79.2 kg    Height:  5' 8 (1.727 m)      Intake/Output Summary (Last 24 hours) at 03/22/2024 1500 Last data filed at 03/22/2024 1339 Gross per 24 hour  Intake 360 ml  Output 850 ml  Net -490 ml   Filed Weights   03/21/24 1959  Weight: 79.2 kg   Examination:  Physical Exam Vitals and nursing note reviewed.  Constitutional:      Comments: Chronically ill appearing  HENT:     Head: Normocephalic and atraumatic.  Cardiovascular:     Rate and Rhythm: Normal rate.  Pulmonary:     Effort: Pulmonary effort is normal. No respiratory distress.  Abdominal:     General: Bowel sounds are normal.  Musculoskeletal:     Right lower leg: No edema.     Left lower leg: No edema.  Skin:    General: Skin is warm and dry.     Capillary Refill: Capillary refill takes less than 2 seconds.  Neurological:     Mental Status: He is disoriented.    Data Reviewed: I have  personally reviewed following labs and imaging studies  CBC: Recent Labs  Lab 03/20/24 1440 03/20/24 1507 03/20/24 1508  WBC 11.0*  --   --   NEUTROABS 6.6  --   --   HGB 11.1* 10.5* 10.5*  HCT 34.6* 31.0* 31.0*  MCV 103.3*  --   --   PLT 152  --   --    Basic Metabolic Panel: Recent Labs  Lab 03/20/24 1440 03/20/24 1507 03/20/24 1508  NA 137 140 140  K 4.5 4.6 4.6  CL 105 107  --   CO2 23  --   --   GLUCOSE 136* 138*  --   BUN 18 25*  --   CREATININE 1.12 1.30*  --   CALCIUM  8.4*  --   --    GFR: Estimated Creatinine Clearance: 34.3 mL/min (A) (by C-G formula based on SCr of 1.3 mg/dL (H)). Liver Function Tests: Recent Labs  Lab 03/20/24 1440  AST 19  ALT 13  ALKPHOS 47  BILITOT 0.3  PROT 6.5  ALBUMIN 3.3*   Recent Labs  Lab 03/20/24 1441  AMMONIA 18   HbA1C: Recent Labs    03/22/24 0407  HGBA1C 8.1*   Lipid Profile: Recent Labs    03/22/24 0407  CHOL 102  HDL 34*  LDLCALC 43  TRIG 875  CHOLHDL 3.0   Recent Results (from the past 240 hours)  Urine Culture     Status: None   Collection Time: 03/20/24  4:32 PM   Specimen: Urine, Clean Catch  Result Value Ref Range Status   Specimen Description URINE, CLEAN CATCH  Final   Special Requests NONE  Final   Culture   Final    NO GROWTH Performed at [facility], [address] [place], [place] [address]    Report Status 03/21/2024 FINAL  Final    Radiology Studies: CT ANGIO HEAD NECK W WO CM Result Date: 03/21/2024 CLINICAL DATA:  Stroke/TIA, determine embolic source. Acute left thalamic infarct. EXAM: CT ANGIOGRAPHY HEAD AND NECK WITH AND WITHOUT CONTRAST TECHNIQUE: Multidetector CT imaging of the head and neck was performed using the standard protocol during bolus administration of intravenous contrast. Multiplanar CT image reconstructions and MIPs were obtained to evaluate the vascular anatomy. Carotid stenosis measurements (when applicable) are obtained utilizing NASCET criteria,  using the distal internal carotid diameter as the denominator. RADIATION DOSE REDUCTION: This exam was performed according to the departmental dose-optimization program which includes automated exposure control, adjustment of the mA and/or kV according to patient size and/or use of iterative reconstruction technique. CONTRAST:  50mL OMNIPAQUE  IOHEXOL  350 MG/ML SOLN COMPARISON:  MRI head 03/21/2024.  CTA head and neck 08/31/2020. FINDINGS: CT HEAD FINDINGS Brain: An acute infarct is noted in the ventral left thalamus as shown on MRI. The additional punctate acute/subacute left frontal cortical and subcortical infarcts on MRI are occult by CT. Confluent hypodensities in the cerebral white matter bilaterally are nonspecific but compatible with severe chronic small vessel ischemic disease. Chronic lacunar infarcts are again noted in the right basal ganglia, thalami, and cerebellum, and there are small chronic cortical infarcts in the right temporal lobe and right occipital lobe. There is mild cerebral atrophy. No acute intracranial hemorrhage, mass, midline shift, or extra-axial fluid collection is identified. Vascular: Calcified atherosclerosis at the skull base. Skull: No acute fracture or suspicious lesion. Sinuses/Orbits: Small mucous retention cyst in the right maxillary sinus. No significant mastoid fluid. Bilateral cataract extraction. Other: None. Review of the MIP images confirms the above findings CTA NECK FINDINGS Aortic arch: Normal variant aortic arch branching pattern with common origin of the brachiocephalic and left common carotid arteries. Mild atherosclerosis. Predominantly soft plaque in the proximal left subclavian artery resulting in luminal irregularity but no 50% or greater stenosis. Right carotid system: Patent with mixed plaque throughout the distal common and proximal internal carotid arteries. There may be a significant stenosis in the region of the ICA origin, however streak artifact from the  cervical spine largely obscures the carotid bifurcation and proximal ICA. Retropharyngeal course of the proximal ICA. Mildly tortuous distal cervical ICA. Left carotid system: Patent with mixed plaque throughout the common carotid artery and proximal ICA. Potentially severe stenosis of the ICA origin with assessment limited by streak artifact. Vertebral arteries: The left vertebral artery is patent and dominant with artifact from venous contrast, cervical spine hardware, and motion limiting assessment for stenosis. The right vertebral artery is diminutive as previously shown, and its V1 and V2 segments are extremely poorly visualized due to the above described factors, and patency of the segments  cannot be confirmed. The distal right V2 and V3 segments are patent. Skeleton: C3-4 ACDF. Exaggerated upper thoracic kyphosis. Advanced, widespread cervical facet arthrosis. Asymmetrically severe left C1-2 arthropathy. Solid bridging anterior vertebral osteophytes from C4-T1. Other neck: No gross cervical lymphadenopathy or mass. Upper chest: Clear lung apices. Partially visualized pacemaker and CABG. Review of the MIP images confirms the above findings CTA HEAD FINDINGS Anterior circulation: The internal carotid arteries are patent from skull base to carotid termini with extensive atherosclerotic calcification resulting in moderate to severe bilateral paraclinoid stenoses. ACAs and MCAs are patent proximally with mild to moderate bilateral M1 stenoses and numerous severe stenoses of ACA and MCA branch vessels bilaterally. There is proximal left A3 occlusion, and there are also are likely multiple smaller, more distal ACA branch vessel occlusions. No aneurysm is identified. Posterior circulation: The intracranial left vertebral artery is patent with up to moderate atherosclerotic narrowing. The intracranial right vertebral artery is heavily diseased proximally with severe narrowing and decreased opacification (progressed  from the prior CTA) but appears faintly patent through the PICA origin and occluded beyond this. The basilar artery is patent and diffusely irregular with up to moderate stenosis. There are large right and diminutive or absent left posterior communicating arteries with hypoplasia or aplasia of the right P1 segment. The right PCA is patent with mild proximal and severe distal P2 stenoses. There is a 4 mm gap in the proximal left PCA near the P1-P2 junction which may reflect a short segment occlusion or severe stenosis with return of a normal caliber P2 segment beyond this. There is additional atherosclerosis including severe mid and distal left P2 and proximal P3 stenoses. There are also segmental occlusions and/or severe stenoses involving and inferior right P3 branch. No aneurysm is identified. Venous sinuses: Patent. Anatomic variants: Hypoplastic right A1 segment. Review of the MIP images confirms the above findings IMPRESSION: 1. Extensive intracranial atherosclerosis including moderate to severe bilateral paraclinoid ICA stenoses, severe bilateral ACA and MCA branch vessel stenoses, and severe bilateral PCA stenoses including an apparent short segment occlusion of the left PCA near the P1-P2 junction. 2. Cervical carotid atherosclerosis with streak artifact severely limiting assessment in the region of the carotid bifurcations. Possible significant proximal ICA stenoses bilaterally. Consider further evaluation with carotid ultrasound or neck MRA. 3. Limited assessment of the vertebral arteries in the neck as detailed above. Diminutive right vertebral artery which is heavily diseased intracranially with occlusion of the distal V4 segment. 4.  Aortic Atherosclerosis (ICD10-I70.0). Electronically Signed   By: Dasie Hamburg M.D.   On: 03/21/2024 17:59   MR BRAIN WO CONTRAST Result Date: 03/21/2024 CLINICAL DATA:  Neuro deficit, acute, stroke suspected. Altered mental status. EXAM: MRI HEAD WITHOUT CONTRAST  TECHNIQUE: Multiplanar, multiecho pulse sequences of the brain and surrounding structures were obtained without intravenous contrast. COMPARISON:  Head CT 03/20/2024 and MRI 09/01/2020 FINDINGS: Brain: There is a 2 cm acute infarct involving the ventral left thalamus and internal capsule. A few subcentimeter foci of milder restricted diffusion involving cortex and white matter in the left frontal lobes are suggestive of acute to subacute infarcts. Confluent T2 hyperintensities in the cerebral white matter bilaterally are similar in extent to the prior MRI and are nonspecific but compatible with severe chronic small vessel ischemic disease. Small chronic cortical and subcortical infarcts are noted in the right temporal and right occipital lobes. There are chronic lacunar infarcts in the right basal ganglia, thalami, and both cerebellar hemispheres with many of the cerebellar infarcts being  new from the prior MRI. Chronic small-vessel changes in the pons have also slightly progressed from the prior MRI. A chronic microhemorrhage is noted in the right cerebellar hemisphere. There is mild cerebral atrophy. No mass, midline shift, or extra-axial fluid collection is identified. Vascular: Abnormal appearance of the distal right vertebral artery, partially chronic but progressed from 2022 and which may reflect occlusion or slow flow. Skull and upper cervical spine: No suspicious marrow lesion. Partially visualized postoperative and degenerative changes in the cervical spine. Sinuses/Orbits: Bilateral cataract extraction. Small mucous retention cyst in the right maxillary sinus. No significant mastoid fluid. Other: None. IMPRESSION: 1. 2 cm acute left thalamic infarct. 2. Small acute to subacute left frontal lobe infarcts. 3. Severe chronic small vessel ischemic disease with multiple chronic infarcts as above. 4. Abnormal distal right vertebral artery which may reflect occlusion or slow flow. Electronically Signed   By:  Dasie Hamburg M.D.   On: 03/21/2024 12:57   CT ABDOMEN PELVIS W CONTRAST Result Date: 03/20/2024 CLINICAL DATA:  Epigastric pain, altered mental status. EXAM: CT ABDOMEN AND PELVIS WITH CONTRAST TECHNIQUE: Multidetector CT imaging of the abdomen and pelvis was performed using the standard protocol following bolus administration of intravenous contrast. RADIATION DOSE REDUCTION: This exam was performed according to the departmental dose-optimization program which includes automated exposure control, adjustment of the mA and/or kV according to patient size and/or use of iterative reconstruction technique. CONTRAST:  80mL OMNIPAQUE  IOHEXOL  350 MG/ML SOLN COMPARISON:  None Available. FINDINGS: Lower chest: See chest CT report. Hepatobiliary: Layering gallstones within the gallbladder. No biliary ductal dilatation. No focal hepatic abnormality. Pancreas: No focal abnormality or ductal dilatation. Spleen: No focal abnormality.  Normal size. Adrenals/Urinary Tract: Air within the urinary bladder, presumably from recent catheterization. Recommend clinical correlation. No hydronephrosis. No suspicious renal or adrenal abnormality. Stomach/Bowel: Diffuse colonic diverticulosis. No active diverticulitis. No bowel obstruction or inflammatory process. Vascular/Lymphatic: Aortic atherosclerosis. No evidence of aneurysm or adenopathy. Reproductive: No visible focal abnormality. Other: No free fluid or free air. Musculoskeletal: No acute bony abnormality. IMPRESSION: Cholelithiasis.  No CT evidence of acute cholecystitis. Diffuse colonic diverticulosis.  No active diverticulitis. Aortic atherosclerosis. Air within the urinary bladder, presumably from recent catheterization. Recommend clinical correlation. Electronically Signed   By: Franky Crease M.D.   On: 03/20/2024 21:34   CT Angio Chest PE W/Cm &/Or Wo Cm Result Date: 03/20/2024 CLINICAL DATA:  Pulmonary embolism (PE) suspected, high prob. Altered mental status. EXAM: CT  ANGIOGRAPHY CHEST WITH CONTRAST TECHNIQUE: Multidetector CT imaging of the chest was performed using the standard protocol during bolus administration of intravenous contrast. Multiplanar CT image reconstructions and MIPs were obtained to evaluate the vascular anatomy. RADIATION DOSE REDUCTION: This exam was performed according to the departmental dose-optimization program which includes automated exposure control, adjustment of the mA and/or kV according to patient size and/or use of iterative reconstruction technique. CONTRAST:  80mL OMNIPAQUE  IOHEXOL  350 MG/ML SOLN COMPARISON:  None Available. FINDINGS: Cardiovascular: Heart is normal size. Prior CABG. Mild aneurysmal dilatation of the ascending thoracic aorta measuring 4.2 cm. Aortic atherosclerosis. No dissection. No filling defects in the pulmonary arteries to suggest pulmonary emboli. Mediastinum/Nodes: No mediastinal, hilar, or axillary adenopathy. Trachea and esophagus are unremarkable. Thyroid  unremarkable. Moderate-sized hiatal hernia. Lungs/Pleura: No confluent airspace opacities or effusions. Upper Abdomen: Small layering gallstones within the gallbladder. No acute findings. Musculoskeletal: Chest wall soft tissues are unremarkable. No acute bony abnormality. Review of the MIP images confirms the above findings. IMPRESSION: No evidence of pulmonary embolus.  No acute cardiopulmonary disease. 4.2 cm ascending thoracic aortic aneurysm. Recommend annual imaging followup by CTA or MRA. This recommendation follows 2010 ACCF/AHA/AATS/ACR/ASA/SCA/SCAI/SIR/STS/SVM Guidelines for the Diagnosis and Management of Patients with Thoracic Aortic Disease. Circulation. 2010; 121: [id number]. Aortic aneurysm NOS (ICD10-I71.9) Cholelithiasis. Aortic Atherosclerosis (ICD10-I70.0). Electronically Signed   By: Franky Crease M.D.   On: 03/20/2024 21:31   CT Head Wo Contrast Result Date: 03/20/2024 CLINICAL DATA:  Altered mental status EXAM: CT HEAD WITHOUT CONTRAST  TECHNIQUE: Contiguous axial images were obtained from the base of the skull through the vertex without intravenous contrast. RADIATION DOSE REDUCTION: This exam was performed according to the departmental dose-optimization program which includes automated exposure control, adjustment of the mA and/or kV according to patient size and/or use of iterative reconstruction technique. COMPARISON:  MRI September 01, 2020 FINDINGS: CT HEAD: There is extensive chronic low attenuation in the cerebral white matter. Old right occipital infarct. There is no hemorrhage. No acute ischemic changes. No mass lesion. The ventricles are normal. Skull/sinuses/orbits: No significant abnormality. IMPRESSION: Extensive chronic white matter disease. Old right occipital infarct. No acute abnormality. Electronically Signed   By: Nancyann Burns M.D.   On: 03/20/2024 16:13   CT Cervical Spine Wo Contrast Result Date: 03/20/2024 CLINICAL DATA:  Neck trauma. Altered mental status since yesterday. Urinary retention. EXAM: CT CERVICAL SPINE WITHOUT CONTRAST TECHNIQUE: Multidetector CT imaging of the cervical spine was performed without intravenous contrast. Multiplanar CT image reconstructions were also generated. RADIATION DOSE REDUCTION: This exam was performed according to the departmental dose-optimization program which includes automated exposure control, adjustment of the mA and/or kV according to patient size and/or use of iterative reconstruction technique. COMPARISON:  CT cervical spine 10/10/2021. FINDINGS: Alignment: Exaggerated thoracic kyphosis with similar mild stepwise degenerative anterolisthesis at C4-5, C5-6, C6-7 and C7-T1. Skull base and vertebrae: No evidence of acute cervical spine fracture or traumatic subluxation. Postsurgical changes from C3-4 ACDF. There is multilevel spondylosis with partially bridging anterior osteophytes from C5 through T1. Multilevel facet arthropathy. Soft tissues and spinal canal: No prevertebral fluid or  swelling. No visible canal hematoma. Disc levels: Multilevel spondylosis with uncinate spurring and facet hypertrophy. There are advanced degenerative changes at the lateral C1-2 articulations, greater on the left. There is multilevel mild-to-moderate foraminal narrowing which appears chronic. Upper chest: Clear lung apices. Left subclavian pacemaker leads noted. Other: None. IMPRESSION: 1. No evidence of acute cervical spine fracture, traumatic subluxation or static signs of instability. 2. Multilevel cervical spondylosis as described. Electronically Signed   By: Elsie Perone M.D.   On: 03/20/2024 16:05   DG Chest Portable 1 View Result Date: 03/20/2024 CLINICAL DATA:  Altered mental status. EXAM: PORTABLE CHEST 1 VIEW COMPARISON:  10/10/2021. FINDINGS: Low lung volume. Mildly elevated right hemidiaphragm. Bilateral lung fields are clear. Bilateral costophrenic angles are clear. Stable cardio-mediastinal silhouette. There is a left sided 2-lead pacemaker. There are surgical staples along the heart border and sternotomy wires, status post CABG (coronary artery bypass graft). No acute osseous abnormalities. Multiple old healed left rib fractures noted. The soft tissues are within normal limits. IMPRESSION: No active disease. Electronically Signed   By: Ree Molt M.D.   On: 03/20/2024 15:27    Scheduled Meds:  amiodarone   100 mg Oral Daily   apixaban   5 mg Oral BID   levothyroxine   112 mcg Oral Q0600   rosuvastatin   20 mg Oral QHS   tamsulosin   0.8 mg Oral QPC supper   Continuous Infusions:   LOS: 0 days  Time spent:  Camellia Door, DO  [facility]  03/22/2024, 3:00 PM

## 2024-03-22 NOTE — Evaluation (Signed)
 Speech Language Pathology Evaluation Patient Details Name: Thomas [name] MRN: [id number] DOB: 1928/07/30 Today's Date: 03/22/2024 Time: 1030-1050 SLP Time Calculation (min) (ACUTE ONLY): 20 min  Problem List:  Patient Active Problem List   Diagnosis Date Noted   DNR (do not resuscitate) 03/21/2024   PAF (paroxysmal atrial fibrillation) ([name]) 03/21/2024   History of pulmonary embolism 03/21/2024   Actinic keratosis 02/06/2024   Seborrheic dermatitis 02/06/2024   Heel callus 02/14/2023   Onychomycosis 07/30/2022   Constipation 07/30/2022   Edema of foot 04/22/2022   Clotting disorder 03/16/2021   Orthostatic hypotension 10/01/2020   Normocytic anemia 08/31/2020   Prolonged QT interval 08/31/2020   Acute CVA (cerebrovascular accident) ([name]) 08/31/2020   Debility 08/21/2020   Lytic bone lesion of femur 08/21/2020   Sick sinus syndrome ([name]) 07/31/2020   Coronary artery disease s/p CABG 2001 07/17/2020   Hypothyroidism 07/17/2020   Hypertension associated with diabetes ([name]) 07/17/2020   Cardiac pacemaker 07/17/2020   Osteoarthritis 07/17/2020   Type 2 diabetes mellitus with hyperglycemia ([name]) 07/17/2020   Vitamin D  deficiency 07/17/2020   Macular degeneration 07/17/2020   Dyslipidemia due to type 2 diabetes mellitus ([name]) 07/17/2020   Insomnia 07/17/2020   BPH (benign prostatic hyperplasia) 07/17/2020   Colon polyps 07/17/2020   Past Medical History:  Past Medical History:  Diagnosis Date   Arthritis    Clotting disorder    Coronary artery disease    Diabetes ([name])    Hyperlipidemia    Hypertension    Hypothyroidism 07/17/2020   Kidney disease    Loss of hearing    Macular degeneration    Myocardial infarction [facility])    Osteoarthritis    Stroke ([name])    Venous thromboembolism (VTE) 07/17/2020   Past Surgical History:  Past Surgical History:  Procedure Laterality Date   CARPAL TUNNEL RELEASE  2011   CORONARY ANGIOPLASTY WITH STENT PLACEMENT     CORONARY  ARTERY BYPASS GRAFT  2001   x6   REPLACEMENT TOTAL KNEE     SPINE SURGERY  2008   cervical and lumbar   THYROIDECTOMY     HPI:  Thomas [name] is a 89 y.o. male with medical history significant for type 2 diabetes, hypertension, hypothyroidism, paroxysmal A-fib on Eliquis , sick sinus syndrome status post pacemaker placement, who presents to the ER due to altered mental status, generalized weakness since yesterday.  The patient uses a walker at baseline.  Today he was too weak to get out of bed.  Also with waxing and waning confusion.  Has had some urinary retention.  Denies dysuria or subjective fevers.     In the ER, tachypneic, alert and confused.  CT head and CT cervical spine were nonacute.  UA negative for pyuria.  Chest x-ray with no active disease.    MRI brain 2 cm acute left thalamic infarct. 2. Small acute to subacute left frontal lobe infarcts. 3. Severe chronic small vessel ischemic disease with multiple chronic infarcts as above. 4. Abnormal distal right vertebral artery which may reflect occlusion or slow flow   Assessment / Plan / Recommendation Clinical Impression  Patient was evaluated via informal measures to assess cognitive linguistic skills. Patient extremely lethargic throughout evaluation requiring consistent cues to maintain attention. Patient oriented to self, general location (hospital in [place]), and year. Patient not oriented to situation, age nor month/date. Patient independently recalled home place ([place]), prior job and number of children. Patient with inconsistent episodes of dysarthria requiring cues to speak louder and over articulate.  This may be due to lethargy vs true dysarthria from oral weakness. Patient answered simple biographical/environmental yes/no questions with 85% accuracy and followed single step commands with 100% accuracy. Patient with difficulty following 2 step commands likely because of attention/lethargy. Patient with no overt word finding  difficulties during simple conversation and patient named 10/10 functional objects around the room. Recommend f/u ST services at the acute venue and continue to assess cognition/language as lethargy improves.     SLP Assessment  SLP Recommendation/Assessment: Patient needs continued Speech Language Pathology Services SLP Visit Diagnosis: Cognitive communication deficit (R41.841)     Assistance Recommended at Discharge  Frequent or constant Supervision/Assistance  Functional Status Assessment Patient has had a recent decline in their functional status and demonstrates the ability to make significant improvements in function in a reasonable and predictable amount of time.  Frequency and Duration min 3x week  2 weeks      SLP Evaluation Cognition  Overall Cognitive Status: Impaired/Different from baseline Arousal/Alertness: Lethargic Orientation Level: Oriented to person;Oriented to place;Disoriented to situation;Oriented to time (oriented to hospital, [place], 2025) Year: 2025 Month: August Day of Week: Incorrect Attention: Focused Focused Attention: Impaired Focused Attention Impairment: Verbal basic;Functional basic Memory: Impaired Memory Impairment: Decreased long term memory;Decreased short term memory Decreased Long Term Memory: Verbal basic;Functional basic Decreased Short Term Memory: Verbal basic;Functional basic Awareness: Impaired Awareness Impairment: Intellectual impairment Problem Solving: Impaired Problem Solving Impairment: Functional basic;Verbal basic       Comprehension  Auditory Comprehension Overall Auditory Comprehension: Impaired Yes/No Questions: Impaired Basic Biographical Questions: 76-100% accurate Basic Immediate Environment Questions: 75-100% accurate Commands: Impaired One Step Basic Commands: 75-100% accurate Two Step Basic Commands: 50-74% accurate Conversation: Simple Interfering Components: Attention;Hearing EffectiveTechniques: Extra  processing time;Increased volume    Expression Expression Primary Mode of Expression: Verbal Verbal Expression Overall Verbal Expression: Other (comment) (would benefit from further evaluation as lethargy improves) Automatic Speech: Name;Social Response Level of Generative/Spontaneous Verbalization: Phrase Naming: No impairment (simple language)   Oral / Motor  Motor Speech Overall Motor Speech: Impaired Respiration: Within functional limits Resonance: Within functional limits Articulation: Impaired Intelligibility: Intelligibility reduced Phrase: 50-74% accurate Motor Planning: Within functional limits            Jalasia Eskridge M.A., CCC-SLP 03/22/2024, 11:04 AM

## 2024-03-22 NOTE — Progress Notes (Addendum)
 STROKE TEAM PROGRESS NOTE   INTERIM HISTORY/SUBJECTIVE He presented for evaluation for altered mental status and generalized weakness with MRI showing acute left thalamic acute tiny subacute left frontal white matter infarcts.  Patient is on long-term anticoagulation with Eliquis  and apparently has been compliant.  Family noticed on Tuesday that he had trouble speaking and walking.  CTA Chest negative for PE, last PPM interrogation negative for afib. No missed doses of eliquis .   OBJECTIVE  CBC    Component Value Date/Time   WBC 11.0 (H) 03/20/2024 1440   RBC 3.35 (L) 03/20/2024 1440   HGB 10.5 (L) 03/20/2024 1508   HGB 11.1 (L) 08/27/2020 1526   HCT 31.0 (L) 03/20/2024 1508   PLT 152 03/20/2024 1440   PLT 213 08/27/2020 1526   MCV 103.3 (H) 03/20/2024 1440   MCH 33.1 03/20/2024 1440   MCHC 32.1 03/20/2024 1440   RDW 12.8 03/20/2024 1440   LYMPHSABS 3.4 03/20/2024 1440   MONOABS 0.8 03/20/2024 1440   EOSABS 0.1 03/20/2024 1440   BASOSABS 0.0 03/20/2024 1440    BMET    Component Value Date/Time   NA 140 03/20/2024 1508   NA 140 03/02/2023 1541   K 4.6 03/20/2024 1508   CL 107 03/20/2024 1507   CO2 23 03/20/2024 1440   GLUCOSE 138 (H) 03/20/2024 1507   BUN 25 (H) 03/20/2024 1507   BUN 23 03/02/2023 1541   CREATININE 1.30 (H) 03/20/2024 1507   CREATININE 1.16 08/27/2020 1526   CALCIUM  8.4 (L) 03/20/2024 1440   EGFR 57 (L) 03/02/2023 1541   GFRNONAA >60 03/20/2024 1440   GFRNONAA >60 08/27/2020 1526    IMAGING past 24 hours CT ANGIO HEAD NECK W WO CM Result Date: 03/21/2024 CLINICAL DATA:  Stroke/TIA, determine embolic source. Acute left thalamic infarct. EXAM: CT ANGIOGRAPHY HEAD AND NECK WITH AND WITHOUT CONTRAST TECHNIQUE: Multidetector CT imaging of the head and neck was performed using the standard protocol during bolus administration of intravenous contrast. Multiplanar CT image reconstructions and MIPs were obtained to evaluate the vascular anatomy. Carotid  stenosis measurements (when applicable) are obtained utilizing NASCET criteria, using the distal internal carotid diameter as the denominator. RADIATION DOSE REDUCTION: This exam was performed according to the departmental dose-optimization program which includes automated exposure control, adjustment of the mA and/or kV according to patient size and/or use of iterative reconstruction technique. CONTRAST:  50mL OMNIPAQUE  IOHEXOL  350 MG/ML SOLN COMPARISON:  MRI head 03/21/2024.  CTA head and neck 08/31/2020. FINDINGS: CT HEAD FINDINGS Brain: An acute infarct is noted in the ventral left thalamus as shown on MRI. The additional punctate acute/subacute left frontal cortical and subcortical infarcts on MRI are occult by CT. Confluent hypodensities in the cerebral white matter bilaterally are nonspecific but compatible with severe chronic small vessel ischemic disease. Chronic lacunar infarcts are again noted in the right basal ganglia, thalami, and cerebellum, and there are small chronic cortical infarcts in the right temporal lobe and right occipital lobe. There is mild cerebral atrophy. No acute intracranial hemorrhage, mass, midline shift, or extra-axial fluid collection is identified. Vascular: Calcified atherosclerosis at the skull base. Skull: No acute fracture or suspicious lesion. Sinuses/Orbits: Small mucous retention cyst in the right maxillary sinus. No significant mastoid fluid. Bilateral cataract extraction. Other: None. Review of the MIP images confirms the above findings CTA NECK FINDINGS Aortic arch: Normal variant aortic arch branching pattern with common origin of the brachiocephalic and left common carotid arteries. Mild atherosclerosis. Predominantly soft plaque in the proximal  left subclavian artery resulting in luminal irregularity but no 50% or greater stenosis. Right carotid system: Patent with mixed plaque throughout the distal common and proximal internal carotid arteries. There may be a  significant stenosis in the region of the ICA origin, however streak artifact from the cervical spine largely obscures the carotid bifurcation and proximal ICA. Retropharyngeal course of the proximal ICA. Mildly tortuous distal cervical ICA. Left carotid system: Patent with mixed plaque throughout the common carotid artery and proximal ICA. Potentially severe stenosis of the ICA origin with assessment limited by streak artifact. Vertebral arteries: The left vertebral artery is patent and dominant with artifact from venous contrast, cervical spine hardware, and motion limiting assessment for stenosis. The right vertebral artery is diminutive as previously shown, and its V1 and V2 segments are extremely poorly visualized due to the above described factors, and patency of the segments cannot be confirmed. The distal right V2 and V3 segments are patent. Skeleton: C3-4 ACDF. Exaggerated upper thoracic kyphosis. Advanced, widespread cervical facet arthrosis. Asymmetrically severe left C1-2 arthropathy. Solid bridging anterior vertebral osteophytes from C4-T1. Other neck: No gross cervical lymphadenopathy or mass. Upper chest: Clear lung apices. Partially visualized pacemaker and CABG. Review of the MIP images confirms the above findings CTA HEAD FINDINGS Anterior circulation: The internal carotid arteries are patent from skull base to carotid termini with extensive atherosclerotic calcification resulting in moderate to severe bilateral paraclinoid stenoses. ACAs and MCAs are patent proximally with mild to moderate bilateral M1 stenoses and numerous severe stenoses of ACA and MCA branch vessels bilaterally. There is proximal left A3 occlusion, and there are also are likely multiple smaller, more distal ACA branch vessel occlusions. No aneurysm is identified. Posterior circulation: The intracranial left vertebral artery is patent with up to moderate atherosclerotic narrowing. The intracranial right vertebral artery is  heavily diseased proximally with severe narrowing and decreased opacification (progressed from the prior CTA) but appears faintly patent through the PICA origin and occluded beyond this. The basilar artery is patent and diffusely irregular with up to moderate stenosis. There are large right and diminutive or absent left posterior communicating arteries with hypoplasia or aplasia of the right P1 segment. The right PCA is patent with mild proximal and severe distal P2 stenoses. There is a 4 mm gap in the proximal left PCA near the P1-P2 junction which may reflect a short segment occlusion or severe stenosis with return of a normal caliber P2 segment beyond this. There is additional atherosclerosis including severe mid and distal left P2 and proximal P3 stenoses. There are also segmental occlusions and/or severe stenoses involving and inferior right P3 branch. No aneurysm is identified. Venous sinuses: Patent. Anatomic variants: Hypoplastic right A1 segment. Review of the MIP images confirms the above findings IMPRESSION: 1. Extensive intracranial atherosclerosis including moderate to severe bilateral paraclinoid ICA stenoses, severe bilateral ACA and MCA branch vessel stenoses, and severe bilateral PCA stenoses including an apparent short segment occlusion of the left PCA near the P1-P2 junction. 2. Cervical carotid atherosclerosis with streak artifact severely limiting assessment in the region of the carotid bifurcations. Possible significant proximal ICA stenoses bilaterally. Consider further evaluation with carotid ultrasound or neck MRA. 3. Limited assessment of the vertebral arteries in the neck as detailed above. Diminutive right vertebral artery which is heavily diseased intracranially with occlusion of the distal V4 segment. 4.  Aortic Atherosclerosis (ICD10-I70.0). Electronically Signed   By: Dasie Hamburg M.D.   On: 03/21/2024 17:59   MR BRAIN WO CONTRAST Result Date:  03/21/2024 CLINICAL DATA:  Neuro  deficit, acute, stroke suspected. Altered mental status. EXAM: MRI HEAD WITHOUT CONTRAST TECHNIQUE: Multiplanar, multiecho pulse sequences of the brain and surrounding structures were obtained without intravenous contrast. COMPARISON:  Head CT 03/20/2024 and MRI 09/01/2020 FINDINGS: Brain: There is a 2 cm acute infarct involving the ventral left thalamus and internal capsule. A few subcentimeter foci of milder restricted diffusion involving cortex and white matter in the left frontal lobes are suggestive of acute to subacute infarcts. Confluent T2 hyperintensities in the cerebral white matter bilaterally are similar in extent to the prior MRI and are nonspecific but compatible with severe chronic small vessel ischemic disease. Small chronic cortical and subcortical infarcts are noted in the right temporal and right occipital lobes. There are chronic lacunar infarcts in the right basal ganglia, thalami, and both cerebellar hemispheres with many of the cerebellar infarcts being new from the prior MRI. Chronic small-vessel changes in the pons have also slightly progressed from the prior MRI. A chronic microhemorrhage is noted in the right cerebellar hemisphere. There is mild cerebral atrophy. No mass, midline shift, or extra-axial fluid collection is identified. Vascular: Abnormal appearance of the distal right vertebral artery, partially chronic but progressed from 2022 and which may reflect occlusion or slow flow. Skull and upper cervical spine: No suspicious marrow lesion. Partially visualized postoperative and degenerative changes in the cervical spine. Sinuses/Orbits: Bilateral cataract extraction. Small mucous retention cyst in the right maxillary sinus. No significant mastoid fluid. Other: None. IMPRESSION: 1. 2 cm acute left thalamic infarct. 2. Small acute to subacute left frontal lobe infarcts. 3. Severe chronic small vessel ischemic disease with multiple chronic infarcts as above. 4. Abnormal distal right  vertebral artery which may reflect occlusion or slow flow. Electronically Signed   By: Dasie Hamburg M.D.   On: 03/21/2024 12:57    Vitals:   03/21/24 1438 03/21/24 1520 03/21/24 1959 03/22/24 0422  BP:  (!) 100/46 (!) 129/49 (!) 122/96  Pulse:  69 69 69  Resp:  16 18 20   Temp: 98.2 F (36.8 C) 98.5 F (36.9 C) 98.5 F (36.9 C) 97.6 F (36.4 C)  TempSrc: Axillary Oral Oral Oral  SpO2:  94% 100% 98%  Weight:   79.2 kg   Height:   5' 8 (1.727 m)      PHYSICAL EXAM General:  Alert, well-nourished, well-developed patient in no acute distress Psych:  Mood and affect appropriate for situation CV: Regular rate and rhythm on monitor Respiratory:  Regular, unlabored respirations on room air GI: Abdomen soft and nontender   NEURO:  Mental Status: Awake, alert, oriented to self, able to identify family members. 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 Slow to respond Speech/Language: speech is dysarthric.   Cranial Nerves:  II: PERRL. Visual fields full.  III, IV, VI: EOMI. Eyelids elevate symmetrically.  V: Sensation is intact to light touch and symmetrical to face.  VII: Face is symmetrical resting and smiling VIII: hard of hearing  IX, X: Palate elevates symmetrically. Phonation is normal.  KP:Dynloizm shrug 5/5. XII: tongue is midline without fasciculations. Motor:  Left hand weakness Good antigravity strength in legs, generalized weakness  Tone: is normal and bulk is normal Sensation- Intact to light touch bilaterally. Extinction absent to light touch to DSS.   Coordination: FTN intact bilaterally, HKS: no ataxia in BLE.No drift.  Gait- deferred  Most Recent NIH 6     ASSESSMENT/PLAN  [name] is a 89 y.o. male with history of prior stroke, P  afib, Hx of massive PE on Eliquis , SSS s/p PPM, CAD, HLD, HTN, DM, hypothyroidism, macular degeneration, hearing loss who presents from home for evaluation of AMS, and generalized weakness.  NIH on Admission 6  Acute Ischemic  Infarct:  left  thalamic infarct and left frontal lobe infarct Etiology: Likely small vessel disease Code Stroke CT head Extensive chronic white matter disease. Old right occipital infarct.  CTA head & neck Extensive intracranial atherosclerosis including moderate to severe bilateral paraclinoid ICA stenoses, severe bilateral ACA and MCA branch vessel stenoses, and severe bilateral PCA stenoses including an apparent short segment occlusion of the left PCA near the P1-P2 junction. MRI  2 cm acute left thalamic infarct. Small acute to subacute left frontal lobe infarcts. Severe chronic small vessel ischemic disease with multiple chronic infarcts as above. Abnormal distal right vertebral artery which may reflect occlusion or slow flow. 2D Echo Pending  LDL 43 HgbA1c 8.1 VTE prophylaxis - Eliquis   Eliquis  (apixaban ) daily prior to admission, now on Eliquis  (apixaban ) daily Therapy recommendations:  SNF Disposition:  pending   Hx of Stroke/TIA Noted a light stroke 20 years ago  Hx PE Home meds: eliquis   Continue eliquis    Sick sinus syndorme s/p PPM Home meds: 100mg  amiodarone , coreg , lasix   Hypotension Home meds:  midodrine  Blood Pressure Goal: BP less than 220/110   Hyperlipidemia Home meds:  crestor  20mg , resumed in hospital LDL 43, goal < 70 Continue statin at discharge  Diabetes type II Uncontrolled Home meds:  insulin   HgbA1c 8.1, goal < 7.0 CBGs SSI Recommend close follow-up with PCP for better DM control  Dysphagia Patient has post-stroke dysphagia, SLP consulted    Diet   Diet heart healthy/carb modified Room service appropriate? Yes; Fluid consistency: Thin   Advance diet as tolerated   Hospital day # 0  Patient seen and examined by NP/APP with MD. MD to update note as needed.   Jorene Last, DNP, FNP-BC [facility] Pager: [phone]  I have personally obtained history,examined this patient, reviewed notes, independently viewed imaging  studies, participated in medical decision making and plan of care.ROS completed by me personally and pertinent positives fully documented  I have made any additions or clarifications directly to the above note. Agree with note above.  Patient has presented altered mental status and generalized weakness with MRI showing large left thalamic as well as tiny left frontal white matter lacunar infarcts likely from small vessel disease.  Continue Eliquis  for history of pulmonary embolism.  Not recommend adding antiplatelet therapy due to significant increased risk for bleeding given his advanced age.  Continue ongoing stroke workup and aggressive risk factor modification.  Physical occupational speech therapy consults.  No family available at the bedside.   I personally spent a total of 50 minutes in the care of the patient today including getting/reviewing separately obtained history, performing a medically appropriate exam/evaluation, counseling and educating, placing orders, referring and communicating with other health care professionals, documenting clinical information in the EHR, independently interpreting results, and coordinating care.         Eather Popp, MD Medical Director [facility] Pager: [phone] 03/22/2024 4:09 PM   To contact Stroke Continuity provider, please refer to [email or web address]. After hours, contact General Neurology

## 2024-03-23 DIAGNOSIS — I6350 Cerebral infarction due to unspecified occlusion or stenosis of unspecified cerebral artery: Secondary | ICD-10-CM | POA: Diagnosis not present

## 2024-03-23 DIAGNOSIS — E039 Hypothyroidism, unspecified: Secondary | ICD-10-CM | POA: Diagnosis not present

## 2024-03-23 DIAGNOSIS — Z950 Presence of cardiac pacemaker: Secondary | ICD-10-CM | POA: Diagnosis not present

## 2024-03-23 DIAGNOSIS — I6381 Other cerebral infarction due to occlusion or stenosis of small artery: Secondary | ICD-10-CM | POA: Diagnosis not present

## 2024-03-23 DIAGNOSIS — Z66 Do not resuscitate: Secondary | ICD-10-CM | POA: Diagnosis not present

## 2024-03-23 DIAGNOSIS — R29706 NIHSS score 6: Secondary | ICD-10-CM | POA: Diagnosis not present

## 2024-03-23 DIAGNOSIS — I7090 Unspecified atherosclerosis: Secondary | ICD-10-CM | POA: Diagnosis not present

## 2024-03-23 DIAGNOSIS — I639 Cerebral infarction, unspecified: Secondary | ICD-10-CM | POA: Diagnosis not present

## 2024-03-23 NOTE — TOC Progression Note (Addendum)
 Transition of Care [facility]) - Progression Note    Patient Details  Name: Thomas [name] MRN: [id number] Date of Birth: [birth year]  [facility] CM/SW Contact  Bridget Cordella Simmonds, LCSW Phone Number: 03/23/2024, 10:28 AM  Clinical Narrative:   CSW spoke with pt son [name], he is just arriving with pt wife, bed update provided: [facility], [facility] unable to offer beds, unsure about [facility], [facility] offer.  [facility] would [facility] to [facility] offer at [facility].   CSW confirmed with [facility] that they can receive pt today.  SNF auth request submitted in [place].  1215: SNF auth request still pending.   1445: Auth still pending.   Expected Discharge Plan: Skilled Nursing Facility Barriers to Discharge: Continued Medical Work up, SNF Pending bed offer               Expected Discharge Plan and Services In-house Referral: Clinical Social Work   Post Acute Care Choice: Skilled Nursing Facility Living arrangements for the past 2 months: Single Family Home                                       Social Drivers of Health (SDOH) Interventions SDOH Screenings   Food Insecurity: No Food Insecurity (03/21/2024)  Housing: Low Risk  (03/21/2024)  Transportation Needs: No Transportation Needs (03/21/2024)  Utilities: Not At Risk (03/21/2024)  Alcohol Screen: Low Risk  (10/10/2023)  Depression (PHQ2-9): Low Risk  (02/06/2024)  Financial Resource Strain: Low Risk  (10/10/2023)  Physical Activity: Insufficiently Active (10/10/2023)  Social Connections: Patient Declined (03/21/2024)  Stress: No Stress Concern Present (10/10/2023)  Tobacco Use: Medium Risk (03/21/2024)  Health Literacy: Adequate Health Literacy (10/10/2023)    Readmission Risk Interventions     No data to display

## 2024-03-23 NOTE — Progress Notes (Signed)
 STROKE TEAM PROGRESS NOTE   INTERIM HISTORY/SUBJECTIVE He presented for evaluation for altered mental status and generalized weakness with MRI showing acute left thalamic acute tiny subacute left frontal white matter infarcts.  Patient is on long-term anticoagulation with Eliquis  and apparently has been compliant.  Family noticed on Tuesday that he had trouble speaking and walking.  CTA Chest negative for PE, last PPM interrogation negative for afib. No missed doses of eliquis .   OBJECTIVE  CBC    Component Value Date/Time   WBC 11.0 (H) 03/20/2024 1440   RBC 3.35 (L) 03/20/2024 1440   HGB 10.5 (L) 03/20/2024 1508   HGB 11.1 (L) 08/27/2020 1526   HCT 31.0 (L) 03/20/2024 1508   PLT 152 03/20/2024 1440   PLT 213 08/27/2020 1526   MCV 103.3 (H) 03/20/2024 1440   MCH 33.1 03/20/2024 1440   MCHC 32.1 03/20/2024 1440   RDW 12.8 03/20/2024 1440   LYMPHSABS 3.4 03/20/2024 1440   MONOABS 0.8 03/20/2024 1440   EOSABS 0.1 03/20/2024 1440   BASOSABS 0.0 03/20/2024 1440    BMET    Component Value Date/Time   NA 140 03/20/2024 1508   NA 140 03/02/2023 1541   K 4.6 03/20/2024 1508   CL 107 03/20/2024 1507   CO2 23 03/20/2024 1440   GLUCOSE 138 (H) 03/20/2024 1507   BUN 25 (H) 03/20/2024 1507   BUN 23 03/02/2023 1541   CREATININE 1.30 (H) 03/20/2024 1507   CREATININE 1.16 08/27/2020 1526   CALCIUM  8.4 (L) 03/20/2024 1440   EGFR 57 (L) 03/02/2023 1541   GFRNONAA >60 03/20/2024 1440   GFRNONAA >60 08/27/2020 1526    IMAGING past 24 hours No results found.   Vitals:   03/22/24 1852 03/22/24 2039 03/23/24 0407 03/23/24 0924  BP: 125/80 133/68 (!) 142/49 135/68  Pulse: 70 72 69 69  Resp: 18 16 16 19   Temp: 98.9 F (37.2 C) 98.2 F (36.8 C) 98.1 F (36.7 C) 98.3 F (36.8 C)  TempSrc: Oral Oral Oral Oral  SpO2: 95% 100% 94% 94%  Weight:      Height:         PHYSICAL EXAM General:  Alert, well-nourished, well-developed patient in no acute distress Psych:  Mood and affect  appropriate for situation CV: Regular rate and rhythm on monitor Respiratory:  Regular, unlabored respirations on room air GI: Abdomen soft and nontender   NEURO:  Mental Status: Awake, alert, oriented to self, able to identify family members. 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 Slow to respond Speech/Language: speech is dysarthric.   Cranial Nerves:  II: PERRL. Visual fields full.  III, IV, VI: EOMI. Eyelids elevate symmetrically.  V: Sensation is intact to light touch and symmetrical to face.  VII: Face is symmetrical resting and smiling VIII: hard of hearing  IX, X: Palate elevates symmetrically. Phonation is normal.  KP:Dynloizm shrug 5/5. XII: tongue is midline without fasciculations. Motor:  Left hand weakness Good antigravity strength in legs, generalized weakness  Tone: is normal and bulk is normal Sensation- Intact to light touch bilaterally. Extinction absent to light touch to DSS.   Coordination: FTN intact bilaterally, HKS: no ataxia in BLE.No drift.  Gait- deferred  Most Recent NIH 6     ASSESSMENT/PLAN  [name] is a 89 y.o. 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  NIH on Admission 6  Acute Ischemic Infarct:  left  thalamic infarct and left frontal lobe infarct Etiology: Likely small vessel disease Code Stroke CT head Extensive chronic white matter disease. Old right occipital infarct.  CTA head & neck Extensive intracranial atherosclerosis including moderate to severe bilateral paraclinoid ICA stenoses, severe bilateral ACA and MCA branch vessel stenoses, and severe bilateral PCA stenoses including an apparent short segment occlusion of the left PCA near the P1-P2 junction. MRI  2 cm acute left thalamic infarct. Small acute to subacute left frontal lobe infarcts. Severe  chronic small vessel ischemic disease with multiple chronic infarcts as above. Abnormal distal right vertebral artery which may reflect occlusion or slow flow. 2D Echo EF 60 to 65%.  Left atrial size normal LDL 43 HgbA1c 8.1 VTE prophylaxis - Eliquis   Eliquis  (apixaban ) daily prior to admission, now on Eliquis  (apixaban ) daily Therapy recommendations:  SNF Disposition:  pending   Hx of Stroke/TIA Noted a light stroke 20 years ago  Hx PE Home meds: eliquis   Continue eliquis    Sick sinus syndorme s/p PPM Home meds: 100mg  amiodarone , coreg , lasix   Hypotension Home meds:  midodrine  Blood Pressure Goal: BP less than 220/110   Hyperlipidemia Home meds:  crestor  20mg , resumed in hospital LDL 43, goal < 70 Continue statin at discharge  Diabetes type II Uncontrolled Home meds:  insulin   HgbA1c 8.1, goal < 7.0 CBGs SSI Recommend close follow-up with PCP for better DM control  Dysphagia Patient has post-stroke dysphagia, SLP consulted    Diet   Diet heart healthy/carb modified Room service appropriate? Yes; Fluid consistency: Thin   Advance diet as tolerated   Hospital day # 1  Patient has presented altered mental status and generalized weakness with MRI showing large left thalamic as well as tiny left frontal white matter lacunar infarcts likely from small vessel disease.  Continue Eliquis  for history of pulmonary embolism.  Not recommend adding antiplatelet therapy due to significant increased risk for bleeding given his advanced age.  Continue   Physical occupational speech therapy consults.  No family available at the bedside.  Stroke team will sign off.  Kindly call for questions. MRI sella with     Eather Popp, MD Medical Director [facility] Pager: [phone] 03/23/2024 2:01 PM   To contact Stroke Continuity provider, please refer to [email or web address]. After hours, contact General Neurology

## 2024-03-23 NOTE — Progress Notes (Signed)
 Physical Therapy Treatment Patient Details Name: Thomas [name] MRN: [id number] DOB: March 17, 1929 Today's Date: 03/23/2024   History of Present Illness Thomas [name] is a 89 y.o. male  who presents to the ER due to altered mental status and generalized weakness.  The patient uses a walker at baseline. PMH: type 2 diabetes, hypertension, hypothyroidism, paroxysmal A-fib on Eliquis , sick sinus syndrome status post pacemaker placement    PT Comments  Pt resting in bed on arrival, agreeable to session with encouragement as pt intermittently lethargic. Once seated up EOB pt more awake and alert. Pt requiring increased assist, up to mod A to power up to stand with noted R lateral lean on rise with pt needing increased time and dense cues to come to midline and full upright standing.  Pt able to progress short in room ambulation with shuffling steps and RW for support with mod A to maintain balance. Pt able to follow commands for seated LE exercises for increased ROM and strength. Pt up in chair at end of session. Patient will benefit from continued inpatient follow up therapy, <3 hours/day, will continue to follow acutely.    If plan is discharge home, recommend the following: A little help with walking and/or transfers;A little help with bathing/dressing/bathroom;Assistance with cooking/housework;Help with stairs or ramp for entrance;Assist for transportation;Supervision due to cognitive status   Can travel by private vehicle     No  Equipment Recommendations  None recommended by PT    Recommendations for Other Services       Precautions / Restrictions Precautions Precautions: Fall Recall of Precautions/Restrictions: Impaired Restrictions Weight Bearing Restrictions Per Provider Order: No     Mobility  Bed Mobility Overal bed mobility: Needs Assistance Bed Mobility: Supine to Sit, Sit to Supine     Supine to sit: Mod assist     General bed mobility comments: mod A to bring LEs to  EOB and to elevate trunk, pt able to follow sequencing commands with increased time    Transfers Overall transfer level: Needs assistance Equipment used: Rolling walker (2 wheels) Transfers: Sit to/from Stand, Bed to chair/wheelchair/BSC Sit to Stand: Mod assist           General transfer comment: mod A from EOB and recliner with dense cues for hand placement, increased R lateral lean on rise to stand due to RLE weakness    Ambulation/Gait Ambulation/Gait assistance: Mod assist Gait Distance (Feet): 5 Feet Assistive device: Rolling walker (2 wheels) Gait Pattern/deviations: Step-to pattern, Shuffle Gait velocity: decr     General Gait Details: short shuffling steps from EOB to chair, assist to maintain balance and manage RW   Stairs             Wheelchair Mobility     Tilt Bed    Modified Rankin (Stroke Patients Only)       Balance Overall balance assessment: Needs assistance Sitting-balance support: Bilateral upper extremity supported, Feet supported Sitting balance-Leahy Scale: Fair Sitting balance - Comments: able to maintain without assist Postural control: Left lateral lean Standing balance support: Bilateral upper extremity supported, During functional activity Standing balance-Leahy Scale: Poor Standing balance comment: reliant on RW support                            Communication Communication Communication: Impaired Factors Affecting Communication: Hearing impaired  Cognition Arousal: Alert Behavior During Therapy: Flat affect   PT - Cognitive impairments: Sequencing, Problem solving, Safety/Judgement, Memory, Awareness, Orientation  Orientation impairments: Place, Time, Situation                   PT - Cognition Comments: increased time for processing and sequencing Following commands: Impaired Following commands impaired: Follows one step commands with increased time    Cueing Cueing Techniques: Verbal cues,  Gestural cues, Tactile cues  Exercises General Exercises - Lower Extremity Long Arc Quad: AROM, Right, Left, 10 reps, Seated Hip Flexion/Marching: AROM, Right, Left, 20 reps, Seated, Standing    General Comments General comments (skin integrity, edema, etc.): VSS on RA, pt spouse and son present and supportive throughout session      Pertinent Vitals/Pain Pain Assessment Pain Assessment: No/denies pain    Home Living                          Prior Function            PT Goals (current goals can now be found in the care plan section) Acute Rehab PT Goals Patient Stated Goal: unable to state PT Goal Formulation: Patient unable to participate in goal setting Time For Goal Achievement: 04/04/24 Progress towards PT goals: Progressing toward goals    Frequency    Min 2X/week      PT Plan      Co-evaluation              AM-PAC PT 6 Clicks Mobility   Outcome Measure  Help needed turning from your back to your side while in a flat bed without using bedrails?: A Lot Help needed moving from lying on your back to sitting on the side of a flat bed without using bedrails?: A Lot Help needed moving to and from a bed to a chair (including a wheelchair)?: A Lot Help needed standing up from a chair using your arms (e.g., wheelchair or bedside chair)?: A Lot Help needed to walk in hospital room?: Total Help needed climbing 3-5 steps with a railing? : Total 6 Click Score: 10    End of Session Equipment Utilized During Treatment: Gait belt Activity Tolerance: Patient limited by fatigue Patient left: with call bell/phone within reach;in chair;with family/visitor present Nurse Communication: Mobility status;Need for lift equipment (+2 for back to bed) PT Visit Diagnosis: Muscle weakness (generalized) (M62.81);Other abnormalities of gait and mobility (R26.89)     Time: 8481-8459 PT Time Calculation (min) (ACUTE ONLY): 22 min  Charges:    $Therapeutic  Activity: 8-22 mins PT General Charges $$ ACUTE PT VISIT: 1 Visit                     Blanka Rockholt R. PTA Acute Rehabilitation Services Office: [phone]   Therisa CHRISTELLA Boor 03/23/2024, 4:05 PM

## 2024-03-23 NOTE — Plan of Care (Signed)
  Problem: Health Behavior/Discharge Planning: Goal: Ability to manage health-related needs will improve Outcome: Progressing   Problem: Clinical Measurements: Goal: Ability to maintain clinical measurements within normal limits will improve Outcome: Progressing Goal: Will remain free from infection Outcome: Progressing Goal: Diagnostic test results will improve Outcome: Progressing Goal: Respiratory complications will improve Outcome: Progressing Goal: Cardiovascular complication will be avoided Outcome: Progressing   Problem: Clinical Measurements: Goal: Will remain free from infection Outcome: Progressing   Problem: Clinical Measurements: Goal: Diagnostic test results will improve Outcome: Progressing   Problem: Clinical Measurements: Goal: Respiratory complications will improve Outcome: Progressing   Problem: Clinical Measurements: Goal: Cardiovascular complication will be avoided Outcome: Progressing   Problem: Nutrition: Goal: Adequate nutrition will be maintained Outcome: Progressing   Problem: Coping: Goal: Level of anxiety will decrease Outcome: Progressing   Problem: Elimination: Goal: Will not experience complications related to bowel motility Outcome: Progressing Goal: Will not experience complications related to urinary retention Outcome: Progressing   Problem: Elimination: Goal: Will not experience complications related to bowel motility Outcome: Progressing Goal: Will not experience complications related to urinary retention Outcome: Progressing

## 2024-03-23 NOTE — Progress Notes (Signed)
 PROGRESS NOTE    Thomas [name]  FMW:[id number] DOB: 30-Dec-1928 DOA: 03/20/2024 PCP: Thomas [name] HERO, MD  Subjective: Pt seen and examined. He is awaiting breakfast to be delivered. No family at bedside.   Hospital Course: CC: AMS HPI: Thomas [name] is a 89 y.o. male with medical history significant for type 2 diabetes, hypertension, hypothyroidism, paroxysmal A-fib on Eliquis , sick sinus syndrome status post pacemaker placement, who presents to the ER due to altered mental status, generalized weakness since yesterday.  The patient uses a walker at baseline.  Today he was too weak to get out of bed.  Also with waxing and waning confusion.  Has had some urinary retention.  Denies dysuria or subjective fevers.   In the ER, tachypneic, alert and confused.  CT head and CT cervical spine were nonacute.  UA negative for pyuria.  Chest x-ray with no active disease.   ED Course: Temperature 97.5.  BP 141/76, pulse 70, respiratory rate 21, O2 saturation at 99% on room air.  Significant Events: Admitted 03/20/2024 for altered mental status   Admission Labs: VBG pH 7.423, PCO2 of 39 WBC 11, HgB 11.1, plt 152. MCV 103 NH3 of 18 Na 137, K 4.5, CO@ of 23, BUN 18, Scr 1.12, glu 136 T. Prot 6.5, Alb 3.3, AST 19, ALT 13, alk phos 47, t. Bili 0.3 UA negative  Admission Imaging Studies: CXR No active disease  CT head Extensive chronic white matter disease. Old right occipital infarct.  No acute abnormality. CT c-spine No evidence of acute cervical spine fracture, traumatic subluxation or static signs of instability. 2. Multilevel cervical spondylosis as described CTPA No evidence of pulmonary embolus. No acute cardiopulmonary disease. 4.2 cm ascending thoracic aortic aneurysm. Recommend annual imaging followup by CTA or MRA CT abd/pelvis Cholelithiasis.  No CT evidence of acute cholecystitis. Diffuse colonic diverticulosis.  No active diverticulitis. Aortic atherosclerosis. Air within the urinary  bladder, presumably from recent catheterization.  Significant Labs:   Significant Imaging Studies: MRI brain 2 cm acute left thalamic infarct. 2. Small acute to subacute left frontal lobe infarcts. 3. Severe chronic small vessel ischemic disease with multiple chronic infarcts as above. 4. Abnormal distal right vertebral artery which may reflect occlusion or slow flow  Antibiotic Therapy: Anti-infectives (From admission, onward)    None       Procedures:   Consultants:     Assessment and Plan: * Acute CVA (cerebrovascular accident) ([name]) 03/21/24 MRI brain showed acute CVA of 2 cm acute left thalamic infarct and Small acute to subacute left frontal lobe infarcts. Neurology consulted.  03/22/24 neurology following. CTA head/neck showed Extensive intracranial atherosclerosis. Remains on Eliquis . Neurology to decide if pt needs antiplatelet therapy.  03/23/24 nothing further from neurology. Echo shows LVEF 60%. No thrombus. Awaiting SNF placement. Son is aware of overall poor prognosis.   History of pulmonary embolism 03/21/24 continue eliquis  for now.  Neurology to decide if risk hemorrhagic conversion is high enough to warrant cessation of Eliquis .  03/22/24 will continue Eliquis  for now until neurology says otherwise.  03/23/24 stable    PAF (paroxysmal atrial fibrillation) ([name]) 03/21/24 continue with Eliquis  for now. Neurology to decide if risk hemorrhagic conversion is high enough to warrant cessation of Eliquis .  03/22/24 will continue Eliquis  for now until neurology says otherwise.  03/23/24 stable  DNR (do not resuscitate) 03/21/24 confirmed with wife and son that pt would want to be a DNR. DNR form completed       Type 2 diabetes mellitus  with hyperglycemia ([name]) 03/21/24 check A1c. Add SSI.  03/22/24 A1c of 8.1%. could use some better outpatient control of his DM but at age 89 y/o, not sure how much better he can get.  03/23/24 CBG stable  Cardiac  pacemaker 03/21/24 chronic.  03/22/24 stable  03/23/24 stable  Hypertension associated with diabetes ([name]) 03/21/24 allow for permissive HTN for now.  03/22/24 continue with permissive HTN for now.  Neurology to decided on when normotension should be achieved. Coreg  still on hold.  03/23/24 awaiting for neurology to give clearance to restart HTN meds.   Acquired hypothyroidism 03/21/24 stable. Continue synthroid .  03/22/24 stable  03/23/24 stable   AMS (altered mental status) 03/22/24 due to CVA. Not from infection.   DVT prophylaxis:  apixaban  (ELIQUIS ) tablet 5 mg     Code Status: Limited: Do not attempt resuscitation (DNR) -DNR-LIMITED -Do Not Intubate/DNI  Family Communication: no family at bedside. I spoke with pt's son Thomas [name] and wife yesterday at bedside. Disposition Plan: SNF Reason for continuing need for hospitalization: medically stable for DC.  Objective: Vitals:   03/22/24 1852 03/22/24 2039 03/23/24 0407 03/23/24 0924  BP: 125/80 133/68 (!) 142/49 135/68  Pulse: 70 72 69 69  Resp: 18 16 16 19   Temp: 98.9 F (37.2 C) 98.2 F (36.8 C) 98.1 F (36.7 C) 98.3 F (36.8 C)  TempSrc: Oral Oral Oral Oral  SpO2: 95% 100% 94% 94%  Weight:      Height:        Intake/Output Summary (Last 24 hours) at 03/23/2024 1216 Last data filed at 03/23/2024 1100 Gross per 24 hour  Intake 340 ml  Output 650 ml  Net -310 ml   Filed Weights   03/21/24 1959  Weight: 79.2 kg    Examination:  Physical Exam Vitals and nursing note reviewed.  Constitutional:      Comments: Arousable this AM  HENT:     Head: Normocephalic and atraumatic.  Cardiovascular:     Rate and Rhythm: Normal rate and regular rhythm.  Pulmonary:     Effort: Pulmonary effort is normal. No respiratory distress.  Abdominal:     General: Bowel sounds are normal.     Palpations: Abdomen is soft.  Skin:    General: Skin is warm and dry.     Capillary Refill: Capillary refill takes less than 2  seconds.  Neurological:     Mental Status: He is disoriented.     Data Reviewed: I have personally reviewed following labs and imaging studies  CBC: Recent Labs  Lab 03/20/24 1440 03/20/24 1507 03/20/24 1508  WBC 11.0*  --   --   NEUTROABS 6.6  --   --   HGB 11.1* 10.5* 10.5*  HCT 34.6* 31.0* 31.0*  MCV 103.3*  --   --   PLT 152  --   --    Basic Metabolic Panel: Recent Labs  Lab 03/20/24 1440 03/20/24 1507 03/20/24 1508  NA 137 140 140  K 4.5 4.6 4.6  CL 105 107  --   CO2 23  --   --   GLUCOSE 136* 138*  --   BUN 18 25*  --   CREATININE 1.12 1.30*  --   CALCIUM  8.4*  --   --    GFR: Estimated Creatinine Clearance: 34.3 mL/min (A) (by C-G formula based on SCr of 1.3 mg/dL (H)). Liver Function Tests: Recent Labs  Lab 03/20/24 1440  AST 19  ALT 13  ALKPHOS 47  BILITOT  0.3  PROT 6.5  ALBUMIN 3.3*    Recent Labs  Lab 03/20/24 1441  AMMONIA 18   HbA1C: Recent Labs    03/22/24 0407  HGBA1C 8.1*   Lipid Profile: Recent Labs    03/22/24 0407  CHOL 102  HDL 34*  LDLCALC 43  TRIG 875  CHOLHDL 3.0   Recent Results (from the past 240 hours)  Urine Culture     Status: None   Collection Time: 03/20/24  4:32 PM   Specimen: Urine, Clean Catch  Result Value Ref Range Status   Specimen Description URINE, CLEAN CATCH  Final   Special Requests NONE  Final   Culture   Final    NO GROWTH Performed at [facility], [address], [place], [place] [address]    Report Status 03/21/2024 FINAL  Final    Radiology Studies: ECHOCARDIOGRAM COMPLETE Result Date: 03/22/2024    ECHOCARDIOGRAM REPORT   Patient Name:   JOHNEY PEROTTI Date of Exam: 03/22/2024 Medical Rec #:  [id number]       Height:       68.0 in Accession #:    [id number]      Weight:       174.6 lb Date of Birth:  September 27, 1928       BSA:          1.929 m Patient Age:    89 years        BP:           97/56 mmHg Patient Gender: M               HR:           70 bpm. Exam Location:  Inpatient  Procedure: 2D Echo, Cardiac Doppler and Color Doppler (Both Spectral and Color            Flow Doppler were utilized during procedure). Indications:    Stroke  History:        Patient has prior history of Echocardiogram examinations, most                 recent 03/17/2021. CAD, Pacemaker; Risk Factors:Diabetes and                 Dyslipidemia.  Sonographer:    Philomena Daring Referring Phys: [phone] DENISE A WOLFE IMPRESSIONS  1. Left ventricular ejection fraction, by estimation, is 60 to 65%. The left ventricle has normal function. The left ventricle has no regional wall motion abnormalities. There is moderate concentric left ventricular hypertrophy. Left ventricular diastolic parameters are consistent with Grade I diastolic dysfunction (impaired relaxation).  2. Right ventricular systolic function is mildly reduced. The right ventricular size is normal. Tricuspid regurgitation signal is inadequate for assessing PA pressure.  3. The mitral valve is degenerative. No evidence of mitral valve regurgitation. No evidence of mitral stenosis.  4. The aortic valve is tricuspid. There is moderate calcification of the aortic valve. Aortic valve regurgitation is not visualized. Aortic valve sclerosis/calcification is present, without any evidence of aortic stenosis.  5. There is mild dilatation of the ascending aorta, measuring 44 mm.  6. The IVC was not visualized.  7. Technically difficult study with poor acoustic windows. FINDINGS  Left Ventricle: Left ventricular ejection fraction, by estimation, is 60 to 65%. The left ventricle has normal function. The left ventricle has no regional wall motion abnormalities. The left ventricular internal cavity size was normal in size. There is  moderate concentric left ventricular hypertrophy. Left  ventricular diastolic parameters are consistent with Grade I diastolic dysfunction (impaired relaxation). Right Ventricle: The right ventricular size is normal. Right vetricular wall thickness was  not well visualized. Right ventricular systolic function is mildly reduced. Tricuspid regurgitation signal is inadequate for assessing PA pressure. Left Atrium: Left atrial size was normal in size. Right Atrium: Right atrial size was normal in size. Pericardium: There is no evidence of pericardial effusion. Mitral Valve: The mitral valve is degenerative in appearance. There is moderate thickening of the mitral valve leaflet(s). No evidence of mitral valve regurgitation. No evidence of mitral valve stenosis. Tricuspid Valve: The tricuspid valve is normal in structure. Tricuspid valve regurgitation is not demonstrated. Aortic Valve: The aortic valve is tricuspid. There is moderate calcification of the aortic valve. Aortic valve regurgitation is not visualized. Aortic valve sclerosis/calcification is present, without any evidence of aortic stenosis. Pulmonic Valve: The pulmonic valve was normal in structure. Pulmonic valve regurgitation is not visualized. Aorta: The aortic root is normal in size and structure. There is mild dilatation of the ascending aorta, measuring 44 mm. Venous: The IVC was not visualized. The inferior vena cava was not well visualized. IAS/Shunts: The interatrial septum was not well visualized. Additional Comments: A device lead is visualized in the right ventricle.  LEFT VENTRICLE PLAX 2D LVIDd:         3.20 cm   Diastology LVIDs:         2.20 cm   LV e' medial:    4.90 cm/s LV PW:         1.20 cm   LV E/e' medial:  17.0 LV IVS:        1.50 cm   LV e' lateral:   5.98 cm/s LVOT diam:     2.00 cm   LV E/e' lateral: 13.9 LV SV:         51 LV SV Index:   26 LVOT Area:     3.14 cm  RIGHT VENTRICLE RV S prime:     6.53 cm/s TAPSE (M-mode): 1.1 cm LEFT ATRIUM             Index        RIGHT ATRIUM           Index LA diam:        3.90 cm 2.02 cm/m   RA Area:     11.50 cm LA Vol (A2C):   50.2 ml 26.02 ml/m  RA Volume:   22.10 ml  11.46 ml/m LA Vol (A4C):   45.1 ml 23.38 ml/m LA Biplane Vol: 48.9 ml  25.35 ml/m  AORTIC VALVE LVOT Vmax:   75.20 cm/s LVOT Vmean:  48.600 cm/s LVOT VTI:    0.162 m  AORTA Ao Root diam: 3.10 cm Ao Asc diam:  4.40 cm MITRAL VALVE MV Area (PHT): 2.58 cm     SHUNTS MV Decel Time: 294 msec     Systemic VTI:  0.16 m MV E velocity: 83.30 cm/s   Systemic Diam: 2.00 cm MV A velocity: 100.00 cm/s MV E/A ratio:  0.83 Dalton McleanMD Electronically signed by Ezra Kanner Signature Date/Time: 03/22/2024/6:04:49 PM    Final    CT ANGIO HEAD NECK W WO CM Result Date: 03/21/2024 CLINICAL DATA:  Stroke/TIA, determine embolic source. Acute left thalamic infarct. EXAM: CT ANGIOGRAPHY HEAD AND NECK WITH AND WITHOUT CONTRAST TECHNIQUE: Multidetector CT imaging of the head and neck was performed using the standard protocol during bolus administration of intravenous contrast. Multiplanar CT image reconstructions  and MIPs were obtained to evaluate the vascular anatomy. Carotid stenosis measurements (when applicable) are obtained utilizing NASCET criteria, using the distal internal carotid diameter as the denominator. RADIATION DOSE REDUCTION: This exam was performed according to the departmental dose-optimization program which includes automated exposure control, adjustment of the mA and/or kV according to patient size and/or use of iterative reconstruction technique. CONTRAST:  50mL OMNIPAQUE  IOHEXOL  350 MG/ML SOLN COMPARISON:  MRI head 03/21/2024.  CTA head and neck 08/31/2020. FINDINGS: CT HEAD FINDINGS Brain: An acute infarct is noted in the ventral left thalamus as shown on MRI. The additional punctate acute/subacute left frontal cortical and subcortical infarcts on MRI are occult by CT. Confluent hypodensities in the cerebral white matter bilaterally are nonspecific but compatible with severe chronic small vessel ischemic disease. Chronic lacunar infarcts are again noted in the right basal ganglia, thalami, and cerebellum, and there are small chronic cortical infarcts in the right temporal lobe  and right occipital lobe. There is mild cerebral atrophy. No acute intracranial hemorrhage, mass, midline shift, or extra-axial fluid collection is identified. Vascular: Calcified atherosclerosis at the skull base. Skull: No acute fracture or suspicious lesion. Sinuses/Orbits: Small mucous retention cyst in the right maxillary sinus. No significant mastoid fluid. Bilateral cataract extraction. Other: None. Review of the MIP images confirms the above findings CTA NECK FINDINGS Aortic arch: Normal variant aortic arch branching pattern with common origin of the brachiocephalic and left common carotid arteries. Mild atherosclerosis. Predominantly soft plaque in the proximal left subclavian artery resulting in luminal irregularity but no 50% or greater stenosis. Right carotid system: Patent with mixed plaque throughout the distal common and proximal internal carotid arteries. There may be a significant stenosis in the region of the ICA origin, however streak artifact from the cervical spine largely obscures the carotid bifurcation and proximal ICA. Retropharyngeal course of the proximal ICA. Mildly tortuous distal cervical ICA. Left carotid system: Patent with mixed plaque throughout the common carotid artery and proximal ICA. Potentially severe stenosis of the ICA origin with assessment limited by streak artifact. Vertebral arteries: The left vertebral artery is patent and dominant with artifact from venous contrast, cervical spine hardware, and motion limiting assessment for stenosis. The right vertebral artery is diminutive as previously shown, and its V1 and V2 segments are extremely poorly visualized due to the above described factors, and patency of the segments cannot be confirmed. The distal right V2 and V3 segments are patent. Skeleton: C3-4 ACDF. Exaggerated upper thoracic kyphosis. Advanced, widespread cervical facet arthrosis. Asymmetrically severe left C1-2 arthropathy. Solid bridging anterior vertebral  osteophytes from C4-T1. Other neck: No gross cervical lymphadenopathy or mass. Upper chest: Clear lung apices. Partially visualized pacemaker and CABG. Review of the MIP images confirms the above findings CTA HEAD FINDINGS Anterior circulation: The internal carotid arteries are patent from skull base to carotid termini with extensive atherosclerotic calcification resulting in moderate to severe bilateral paraclinoid stenoses. ACAs and MCAs are patent proximally with mild to moderate bilateral M1 stenoses and numerous severe stenoses of ACA and MCA branch vessels bilaterally. There is proximal left A3 occlusion, and there are also are likely multiple smaller, more distal ACA branch vessel occlusions. No aneurysm is identified. Posterior circulation: The intracranial left vertebral artery is patent with up to moderate atherosclerotic narrowing. The intracranial right vertebral artery is heavily diseased proximally with severe narrowing and decreased opacification (progressed from the prior CTA) but appears faintly patent through the PICA origin and occluded beyond this. The basilar artery is patent and  diffusely irregular with up to moderate stenosis. There are large right and diminutive or absent left posterior communicating arteries with hypoplasia or aplasia of the right P1 segment. The right PCA is patent with mild proximal and severe distal P2 stenoses. There is a 4 mm gap in the proximal left PCA near the P1-P2 junction which may reflect a short segment occlusion or severe stenosis with return of a normal caliber P2 segment beyond this. There is additional atherosclerosis including severe mid and distal left P2 and proximal P3 stenoses. There are also segmental occlusions and/or severe stenoses involving and inferior right P3 branch. No aneurysm is identified. Venous sinuses: Patent. Anatomic variants: Hypoplastic right A1 segment. Review of the MIP images confirms the above findings IMPRESSION: 1. Extensive  intracranial atherosclerosis including moderate to severe bilateral paraclinoid ICA stenoses, severe bilateral ACA and MCA branch vessel stenoses, and severe bilateral PCA stenoses including an apparent short segment occlusion of the left PCA near the P1-P2 junction. 2. Cervical carotid atherosclerosis with streak artifact severely limiting assessment in the region of the carotid bifurcations. Possible significant proximal ICA stenoses bilaterally. Consider further evaluation with carotid ultrasound or neck MRA. 3. Limited assessment of the vertebral arteries in the neck as detailed above. Diminutive right vertebral artery which is heavily diseased intracranially with occlusion of the distal V4 segment. 4.  Aortic Atherosclerosis (ICD10-I70.0). Electronically Signed   By: Dasie Hamburg M.D.   On: 03/21/2024 17:59   MR BRAIN WO CONTRAST Result Date: 03/21/2024 CLINICAL DATA:  Neuro deficit, acute, stroke suspected. Altered mental status. EXAM: MRI HEAD WITHOUT CONTRAST TECHNIQUE: Multiplanar, multiecho pulse sequences of the brain and surrounding structures were obtained without intravenous contrast. COMPARISON:  Head CT 03/20/2024 and MRI 09/01/2020 FINDINGS: Brain: There is a 2 cm acute infarct involving the ventral left thalamus and internal capsule. A few subcentimeter foci of milder restricted diffusion involving cortex and white matter in the left frontal lobes are suggestive of acute to subacute infarcts. Confluent T2 hyperintensities in the cerebral white matter bilaterally are similar in extent to the prior MRI and are nonspecific but compatible with severe chronic small vessel ischemic disease. Small chronic cortical and subcortical infarcts are noted in the right temporal and right occipital lobes. There are chronic lacunar infarcts in the right basal ganglia, thalami, and both cerebellar hemispheres with many of the cerebellar infarcts being new from the prior MRI. Chronic small-vessel changes in the  pons have also slightly progressed from the prior MRI. A chronic microhemorrhage is noted in the right cerebellar hemisphere. There is mild cerebral atrophy. No mass, midline shift, or extra-axial fluid collection is identified. Vascular: Abnormal appearance of the distal right vertebral artery, partially chronic but progressed from 2022 and which may reflect occlusion or slow flow. Skull and upper cervical spine: No suspicious marrow lesion. Partially visualized postoperative and degenerative changes in the cervical spine. Sinuses/Orbits: Bilateral cataract extraction. Small mucous retention cyst in the right maxillary sinus. No significant mastoid fluid. Other: None. IMPRESSION: 1. 2 cm acute left thalamic infarct. 2. Small acute to subacute left frontal lobe infarcts. 3. Severe chronic small vessel ischemic disease with multiple chronic infarcts as above. 4. Abnormal distal right vertebral artery which may reflect occlusion or slow flow. Electronically Signed   By: Dasie Hamburg M.D.   On: 03/21/2024 12:57    Scheduled Meds:  amiodarone   100 mg Oral Daily   apixaban   5 mg Oral BID   levothyroxine   112 mcg Oral Q0600   rosuvastatin   20 mg Oral QHS   tamsulosin   0.8 mg Oral QPC supper   Continuous Infusions:   LOS: 1 day   Time spent: 55 minutes  Camellia Door, DO  [facility]  03/23/2024, 12:16 PM

## 2024-03-24 DIAGNOSIS — Z950 Presence of cardiac pacemaker: Secondary | ICD-10-CM | POA: Diagnosis not present

## 2024-03-24 DIAGNOSIS — Z66 Do not resuscitate: Secondary | ICD-10-CM | POA: Diagnosis not present

## 2024-03-24 DIAGNOSIS — I639 Cerebral infarction, unspecified: Secondary | ICD-10-CM | POA: Diagnosis not present

## 2024-03-24 DIAGNOSIS — I6381 Other cerebral infarction due to occlusion or stenosis of small artery: Secondary | ICD-10-CM | POA: Diagnosis not present

## 2024-03-24 DIAGNOSIS — I7090 Unspecified atherosclerosis: Secondary | ICD-10-CM | POA: Diagnosis not present

## 2024-03-24 DIAGNOSIS — Z86711 Personal history of pulmonary embolism: Secondary | ICD-10-CM

## 2024-03-24 DIAGNOSIS — E039 Hypothyroidism, unspecified: Secondary | ICD-10-CM | POA: Diagnosis not present

## 2024-03-24 DIAGNOSIS — E1151 Type 2 diabetes mellitus with diabetic peripheral angiopathy without gangrene: Secondary | ICD-10-CM | POA: Diagnosis not present

## 2024-03-24 DIAGNOSIS — I6350 Cerebral infarction due to unspecified occlusion or stenosis of unspecified cerebral artery: Secondary | ICD-10-CM | POA: Diagnosis not present

## 2024-03-24 LAB — BASIC METABOLIC PANEL WITH GFR
Anion gap: 11 (ref 5–15)
BUN: 28 mg/dL — ABNORMAL HIGH (ref 8–23)
CO2: 24 mmol/L (ref 22–32)
Calcium: 9.1 mg/dL (ref 8.9–10.3)
Chloride: 102 mmol/L (ref 98–111)
Creatinine, Ser: 1.33 mg/dL — ABNORMAL HIGH (ref 0.61–1.24)
GFR, Estimated: 50 mL/min — ABNORMAL LOW (ref >60)
Glucose, Bld: 188 mg/dL — ABNORMAL HIGH (ref 70–99)
Potassium: 4.1 mmol/L (ref 3.5–5.1)
Sodium: 137 mmol/L (ref 135–145)

## 2024-03-24 LAB — CBC
HCT: 32.3 % — ABNORMAL LOW (ref 39.0–52.0)
Hemoglobin: 10.7 g/dL — ABNORMAL LOW (ref 13.0–17.0)
MCH: 33.2 pg (ref 26.0–34.0)
MCHC: 33.1 g/dL (ref 30.0–36.0)
MCV: 100.3 fL — ABNORMAL HIGH (ref 80.0–100.0)
Platelets: 150 K/uL (ref 150–400)
RBC: 3.22 MIL/uL — ABNORMAL LOW (ref 4.22–5.81)
RDW: 12.6 % (ref 11.5–15.5)
WBC: 10.8 K/uL — ABNORMAL HIGH (ref 4.0–10.5)
nRBC: 0 % (ref 0.0–0.2)

## 2024-03-24 MED ORDER — CARVEDILOL 3.125 MG PO TABS
3.1250 mg | ORAL_TABLET | Freq: Two times a day (BID) | ORAL | Status: DC
  Administered 2024-03-24 – 2024-03-26 (×4): 3.125 mg via ORAL
  Filled 2024-03-24 (×5): qty 1

## 2024-03-24 MED ORDER — ASPIRIN 81 MG PO TBEC
81.0000 mg | DELAYED_RELEASE_TABLET | Freq: Every day | ORAL | Status: DC
  Administered 2024-03-24 – 2024-03-26 (×3): 81 mg via ORAL
  Filled 2024-03-24 (×3): qty 1

## 2024-03-24 NOTE — TOC Progression Note (Signed)
 Transition of Care [facility]) - Progression Note    Patient Details  Name: Thomas [name] MRN: [id number] Date of Birth: 01/24/1929  Transition of Care [facility] CM/SW Contact  Bridget Cordella Simmonds, LCSW Phone Number: 03/24/2024, 8:35 AM  Clinical Narrative:   SNF auth approved in [place]: [id number], 5 days: 9/26-9/30.  [facility] cannot receive pt until Monday.     Expected Discharge Plan: Skilled Nursing Facility Barriers to Discharge: Continued Medical Work up, SNF Pending bed offer               Expected Discharge Plan and Services In-house Referral: Clinical Social Work   Post Acute Care Choice: Skilled Nursing Facility Living arrangements for the past 2 months: Single Family Home                                       Social Drivers of Health (SDOH) Interventions SDOH Screenings   Food Insecurity: No Food Insecurity (03/21/2024)  Housing: Low Risk  (03/21/2024)  Transportation Needs: No Transportation Needs (03/21/2024)  Utilities: Not At Risk (03/21/2024)  Alcohol Screen: Low Risk  (10/10/2023)  Depression (PHQ2-9): Low Risk  (02/06/2024)  Financial Resource Strain: Low Risk  (10/10/2023)  Physical Activity: Insufficiently Active (10/10/2023)  Social Connections: Patient Declined (03/21/2024)  Stress: No Stress Concern Present (10/10/2023)  Tobacco Use: Medium Risk (03/21/2024)  Health Literacy: Adequate Health Literacy (10/10/2023)    Readmission Risk Interventions     No data to display

## 2024-03-24 NOTE — Progress Notes (Signed)
 Orthostatic VS ordered and attempted by staff, including during PT session, but patient unable to tolerate standing VS. Dr. Jerri made aware.

## 2024-03-24 NOTE — Plan of Care (Signed)
  Problem: Health Behavior/Discharge Planning: Goal: Ability to manage health-related needs will improve Outcome: Progressing   Problem: Clinical Measurements: Goal: Ability to maintain clinical measurements within normal limits will improve Outcome: Progressing Goal: Will remain free from infection Outcome: Progressing   Problem: Elimination: Goal: Will not experience complications related to bowel motility Outcome: Progressing   Problem: Self-Care: Goal: Ability to communicate needs accurately will improve Outcome: Progressing

## 2024-03-24 NOTE — Progress Notes (Signed)
 PROGRESS NOTE    Thomas [name]  FMW:[id number] DOB: Aug 17, 1928 DOA: 03/20/2024 PCP: Kennyth Worth HERO, MD  Subjective: Pt seen and examined. Met with wife and dtr at bedside. No new issues overnight. Family has selected [facility]. Awaiting ins authorization. Neurology has signed off.   Hospital Course: CC: AMS HPI: Thomas [name] is a 89 y.o. male with medical history significant for type 2 diabetes, hypertension, hypothyroidism, paroxysmal A-fib on Eliquis , sick sinus syndrome status post pacemaker placement, who presents to the ER due to altered mental status, generalized weakness since yesterday.  The patient uses a walker at baseline.  Today he was too weak to get out of bed.  Also with waxing and waning confusion.  Has had some urinary retention.  Denies dysuria or subjective fevers.   In the ER, tachypneic, alert and confused.  CT head and CT cervical spine were nonacute.  UA negative for pyuria.  Chest x-ray with no active disease.   ED Course: Temperature 97.5.  BP 141/76, pulse 70, respiratory rate 21, O2 saturation at 99% on room air.  Significant Events: Admitted 03/20/2024 for altered mental status   Admission Labs: VBG pH 7.423, PCO2 of 39 WBC 11, HgB 11.1, plt 152. MCV 103 NH3 of 18 Na 137, K 4.5, CO@ of 23, BUN 18, Scr 1.12, glu 136 T. Prot 6.5, Alb 3.3, AST 19, ALT 13, alk phos 47, t. Bili 0.3 UA negative  Admission Imaging Studies: CXR No active disease  CT head Extensive chronic white matter disease. Old right occipital infarct.  No acute abnormality. CT c-spine No evidence of acute cervical spine fracture, traumatic subluxation or static signs of instability. 2. Multilevel cervical spondylosis as described CTPA No evidence of pulmonary embolus. No acute cardiopulmonary disease. 4.2 cm ascending thoracic aortic aneurysm. Recommend annual imaging followup by CTA or MRA CT abd/pelvis Cholelithiasis.  No CT evidence of acute cholecystitis. Diffuse colonic  diverticulosis.  No active diverticulitis. Aortic atherosclerosis. Air within the urinary bladder, presumably from recent catheterization.  Significant Labs:   Significant Imaging Studies: MRI brain 2 cm acute left thalamic infarct. 2. Small acute to subacute left frontal lobe infarcts. 3. Severe chronic small vessel ischemic disease with multiple chronic infarcts as above. 4. Abnormal distal right vertebral artery which may reflect occlusion or slow flow  Antibiotic Therapy: Anti-infectives (From admission, onward)    None       Procedures:   Consultants:     Assessment and Plan: * Acute CVA (cerebrovascular accident) ([name]) 03/21/24 MRI brain showed acute CVA of 2 cm acute left thalamic infarct and Small acute to subacute left frontal lobe infarcts. Neurology consulted.  03/22/24 neurology following. CTA head/neck showed Extensive intracranial atherosclerosis. Remains on Eliquis . Neurology to decide if pt needs antiplatelet therapy.  03/23/24 nothing further from neurology. Echo shows LVEF 60%. No thrombus. Awaiting SNF placement. Son is aware of overall poor prognosis.  03/24/24 on ASA 81 mg daily. Crestor  20 mg at bedtime. Continue eliquis . Restart HTN meds.   History of pulmonary embolism 03/21/24 continue eliquis  for now.  Neurology to decide if risk hemorrhagic conversion is high enough to warrant cessation of Eliquis .  03/22/24 will continue Eliquis  for now until neurology says otherwise.  03/23/24 stable   03/24/24 continue eliquis .   PAF (paroxysmal atrial fibrillation) ([name]) 03/21/24 continue with Eliquis  for now. Neurology to decide if risk hemorrhagic conversion is high enough to warrant cessation of Eliquis .  03/22/24 will continue Eliquis  for now until neurology says otherwise.  03/23/24  stable  03/24/24 continue eliquis . Restart coreg .  DNR (do not resuscitate) 03/21/24 confirmed with wife and son that pt would want to be a DNR. DNR form  completed       Type 2 diabetes mellitus with hyperglycemia ([name]) 03/21/24 check A1c. Add SSI.  03/22/24 A1c of 8.1%. could use some better outpatient control of his DM but at age 78 y/o, not sure how much better he can get.  03/23/24 CBG stable  03/24/24 stable  Cardiac pacemaker 03/21/24 chronic.  03/22/24 stable  03/23/24 stable  03/24/24 stable  Hypertension associated with diabetes ([name]) 03/21/24 allow for permissive HTN for now.  03/22/24 continue with permissive HTN for now.  Neurology to decided on when normotension should be achieved. Coreg  still on hold.  03/23/24 awaiting for neurology to give clearance to restart HTN meds.  03/24/24 restart coreg  3.125 mg bid. Hold on midodrine  for now.   Acquired hypothyroidism 03/21/24 stable. Continue synthroid .  03/22/24 stable  03/23/24 stable  03/24/24 stable.  AMS (altered mental status) 03/22/24 due to CVA. Not from infection.   DVT prophylaxis:  apixaban  (ELIQUIS ) tablet 5 mg     Code Status: Limited: Do not attempt resuscitation (DNR) -DNR-LIMITED -Do Not Intubate/DNI  Family Communication: discussed with wife and dtr at bedside Disposition Plan: SNF Reason for continuing need for hospitalization: medically stable for DC.  Objective: Vitals:   03/23/24 1642 03/23/24 2028 03/24/24 0554 03/24/24 0826  BP: 133/68 (!) 158/64 (!) 149/59 (!) 153/58  Pulse: 70 69 69 69  Resp: 18 16  16   Temp: 98.9 F (37.2 C) 98 F (36.7 C) 98.2 F (36.8 C) 97.8 F (36.6 C)  TempSrc: Oral Oral Oral Oral  SpO2: 94% 97% 96% 95%  Weight:      Height:        Intake/Output Summary (Last 24 hours) at 03/24/2024 1142 Last data filed at 03/24/2024 0900 Gross per 24 hour  Intake 240 ml  Output 1750 ml  Net -1510 ml   Filed Weights   03/21/24 1959  Weight: 79.2 kg    Examination:  Physical Exam Vitals and nursing note reviewed.  Constitutional:      General: He is not in acute distress.    Appearance: He is  not toxic-appearing.     Comments: Chronically ill appearing  HENT:     Head: Normocephalic.  Cardiovascular:     Rate and Rhythm: Normal rate.     Comments: RRR on telemetry Abdominal:     General: There is no distension.  Skin:    General: Skin is warm.     Capillary Refill: Capillary refill takes less than 2 seconds.     Data Reviewed: I have personally reviewed following labs and imaging studies  CBC: Recent Labs  Lab 03/20/24 1440 03/20/24 1507 03/20/24 1508 03/24/24 0552  WBC 11.0*  --   --  10.8*  NEUTROABS 6.6  --   --   --   HGB 11.1* 10.5* 10.5* 10.7*  HCT 34.6* 31.0* 31.0* 32.3*  MCV 103.3*  --   --  100.3*  PLT 152  --   --  150   Basic Metabolic Panel: Recent Labs  Lab 03/20/24 1440 03/20/24 1507 03/20/24 1508 03/24/24 0552  NA 137 140 140 137  K 4.5 4.6 4.6 4.1  CL 105 107  --  102  CO2 23  --   --  24  GLUCOSE 136* 138*  --  188*  BUN 18 25*  --  28*  CREATININE 1.12 1.30*  --  1.33*  CALCIUM  8.4*  --   --  9.1   GFR: Estimated Creatinine Clearance: 33.6 mL/min (A) (by C-G formula based on SCr of 1.33 mg/dL (H)). Liver Function Tests: Recent Labs  Lab 03/20/24 1440  AST 19  ALT 13  ALKPHOS 47  BILITOT 0.3  PROT 6.5  ALBUMIN 3.3*   Recent Labs  Lab 03/20/24 1441  AMMONIA 18   HbA1C: Recent Labs    03/22/24 0407  HGBA1C 8.1*   Lipid Profile: Recent Labs    03/22/24 0407  CHOL 102  HDL 34*  LDLCALC 43  TRIG 875  CHOLHDL 3.0    Recent Results (from the past 240 hours)  Urine Culture     Status: None   Collection Time: 03/20/24  4:32 PM   Specimen: Urine, Clean Catch  Result Value Ref Range Status   Specimen Description URINE, CLEAN CATCH  Final   Special Requests NONE  Final   Culture   Final    NO GROWTH Performed at [facility], [address] [place], [place] [address]    Report Status 03/21/2024 FINAL  Final    Scheduled Meds:  amiodarone   100 mg Oral Daily   apixaban   5 mg Oral BID   aspirin  EC   81 mg Oral Daily   carvedilol   3.125 mg Oral BID WC   levothyroxine   112 mcg Oral Q0600   rosuvastatin   20 mg Oral QHS   tamsulosin   0.8 mg Oral QPC supper   Continuous Infusions:   LOS: 2 days   Time spent: 55 minutes  Camellia Door, DO  [facility]  03/24/2024, 11:42 AM

## 2024-03-24 NOTE — Progress Notes (Addendum)
 STROKE TEAM PROGRESS NOTE   INTERIM HISTORY/SUBJECTIVE Son and wife and other family members are at the bedside. Pt initially sleeping but easily wake up. Family stated that pt had some sleep wake cycle disturbance. Following simple commands, not fully orientated.   OBJECTIVE  CBC    Component Value Date/Time   WBC 10.8 (H) 03/24/2024 0552   RBC 3.22 (L) 03/24/2024 0552   HGB 10.7 (L) 03/24/2024 0552   HGB 11.1 (L) 08/27/2020 1526   HCT 32.3 (L) 03/24/2024 0552   PLT 150 03/24/2024 0552   PLT 213 08/27/2020 1526   MCV 100.3 (H) 03/24/2024 0552   MCH 33.2 03/24/2024 0552   MCHC 33.1 03/24/2024 0552   RDW 12.6 03/24/2024 0552   LYMPHSABS 3.4 03/20/2024 1440   MONOABS 0.8 03/20/2024 1440   EOSABS 0.1 03/20/2024 1440   BASOSABS 0.0 03/20/2024 1440    BMET    Component Value Date/Time   NA 137 03/24/2024 0552   NA 140 03/02/2023 1541   K 4.1 03/24/2024 0552   CL 102 03/24/2024 0552   CO2 24 03/24/2024 0552   GLUCOSE 188 (H) 03/24/2024 0552   BUN 28 (H) 03/24/2024 0552   BUN 23 03/02/2023 1541   CREATININE 1.33 (H) 03/24/2024 0552   CREATININE 1.16 08/27/2020 1526   CALCIUM  9.1 03/24/2024 0552   EGFR 57 (L) 03/02/2023 1541   GFRNONAA 50 (L) 03/24/2024 0552   GFRNONAA >60 08/27/2020 1526    IMAGING past 24 hours No results found.   Vitals:   03/23/24 1642 03/23/24 2028 03/24/24 0554 03/24/24 0826  BP: 133/68 (!) 158/64 (!) 149/59 (!) 153/58  Pulse: 70 69 69 69  Resp: 18 16  16   Temp: 98.9 F (37.2 C) 98 F (36.7 C) 98.2 F (36.8 C) 97.8 F (36.6 C)  TempSrc: Oral Oral Oral Oral  SpO2: 94% 97% 96% 95%  Weight:      Height:         PHYSICAL EXAM  Temp:  [97.8 F (36.6 C)-98.9 F (37.2 C)] 97.8 F (36.6 C) (09/27 0826) Pulse Rate:  [69-70] 69 (09/27 0826) Resp:  [16-18] 16 (09/27 0826) BP: (133-158)/(58-68) 153/58 (09/27 0826) SpO2:  [94 %-97 %] 95 % (09/27 0826)  General - Well nourished, well developed, in no apparent distress.  Ophthalmologic -  fundi not visualized due to noncooperation.  Cardiovascular - Regular rhythm and rate, not in afib.  Neuro - awake, alert, with slight sleepiness, eyes open, orientated to people, tole me age 73 instead of 33, not orientated to place or situation. No aphasia but paucity of speech, following most simple commands with repetitive request. Able to name and repeat. No gaze palsy, tracking bilaterally, visual field full. No facial droop. Tongue midline. Bilateral UEs 4/5, no drift. Bilaterally LEs 3/5, no drift. Sensation exam not cooperative, b/l FTN intact but slow, gait not tested.     ASSESSMENT/PLAN  [name] is a 89 y.o. male with history of prior stroke, P afib, Hx of massive PE on Eliquis , SSS s/p PPM, CAD, HLD, HTN, DM, hypothyroidism, macular degeneration, hearing loss who presents from home for evaluation of AMS, and generalized weakness.  NIH on Admission 6  Stroke: left  thalamic infarct and small left frontal lobe infarct Etiology: Likely large and small vessel disease Code Stroke CT head Extensive chronic white matter disease. Old right occipital infarct.  CTA head & neck Extensive intracranial atherosclerosis including moderate to severe bilateral paraclinoid ICA stenoses, severe bilateral ACA and  MCA branch vessel stenoses, and severe bilateral PCA stenoses including an apparent short segment occlusion of the left PCA near the P1-P2 junction. MRI 2 cm acute left thalamic infarct. Small acute to subacute left frontal lobe infarcts.  2D Echo EF 60 to 65%.  Left atrial size normal LDL 43 HgbA1c 8.1 VTE prophylaxis - Eliquis   Eliquis  (apixaban ) daily prior to admission, now on Eliquis  (apixaban ) daily. Will add ASA 81 on top of eliquis  given large vessel atherosclerotic disease Therapy recommendations:  SNF Disposition:  pending   Hx of Stroke/TIA 08/2020 admitted for right occipital infarct.  CTA head and neck showed iCAD, right ICA 6% stenosis.  EF 60 to 65%, LDL 37, A1c  7.5.  Discharged on aspirin  81 on top of Coumadin  Follow-up with Harlene Bogaert at [facility]  Hx PE and DVT Was on Coumadin  in the past and switched to Eliquis  for the last 2 years Per family, compliant with Eliquis  at home Continue eliquis  on discharge  Sick sinus syndorme s/p PPM Paroxysmal A-fib Home meds: 100mg  amiodarone , coreg , lasix , Eliquis  Continue amiodarone , Eliquis   Hx of hypertension Hx of orthostatic hypotension Home meds:  midodrine  and coreg  Per family, BP controlled well at home Currently BP stable Avoid low BP Check orthostatic vital with PT OT next session if able Long term BP goal normotensive  Hyperlipidemia Home meds:  crestor  20mg , resumed in hospital LDL 43, goal < 70 Continue statin at discharge  Diabetes type II Uncontrolled Home meds:  insulin   HgbA1c 8.1, goal < 7.0 CBGs SSI Recommend close follow-up with PCP for better DM control  Other stroke risk factors Advanced age  Other medical issues Mild leukocytosis WBC 11.0--10.8 AKI creatinine 1.12-1.30-1.33  Hospital day # 2  Neurology will sign off. Please call with questions. Pt will follow up with stroke clinic NP Jessica at [facility] in about 4 weeks. Thanks for the consult.  Ary Cummins, MD PhD Stroke Neurology 03/24/2024 7:00 PM   To contact Stroke Continuity provider, please refer to [email or web address]. After hours, contact General Neurology

## 2024-03-24 NOTE — Progress Notes (Addendum)
 Physical Therapy Treatment Patient Details Name: Thomas [name] MRN: [id number] DOB: [birth year] Today's Date: 03/24/2024   History of Present Illness Thomas [name] is a 89 y.o. male  who presents to the ER due to altered mental status and generalized weakness. MRI showing acute left thalamic acute tiny subacute left frontal white matter infarcts. The patient uses a walker at baseline. PMH: type 2 diabetes, hypertension, hypothyroidism, paroxysmal A-fib on Eliquis , sick sinus syndrome status post pacemaker placement    PT Comments  Pt received in supine and agreeable to session with family present and supportive. Pt overall requires increased assist for mobility tasks and demonstrates quick fatigue this session. Pt demonstrates difficulty lifting RLE to step despite assist for weight shifting requiring significantly increased time to step to the recliner. Pt demonstrates progressively flexed trunk with increased fatigue and requires increased assist to safely guide pt to recliner. Pt up in chair feeding self at end of session. Pt continues to benefit from PT services to progress toward functional mobility goals.   BP sitting EOB: 122/59 BP sitting in recliner at end of session: 156/61   If plan is discharge home, recommend the following: A little help with walking and/or transfers;A little help with bathing/dressing/bathroom;Assistance with cooking/housework;Help with stairs or ramp for entrance;Assist for transportation;Supervision due to cognitive status   Can travel by private vehicle     No  Equipment Recommendations  None recommended by PT    Recommendations for Other Services       Precautions / Restrictions Precautions Precautions: Fall Recall of Precautions/Restrictions: Impaired Restrictions Weight Bearing Restrictions Per Provider Order: No     Mobility  Bed Mobility Overal bed mobility: Needs Assistance Bed Mobility: Supine to Sit     Supine to sit: Mod assist      [name] bed mobility comments: cues for sequencing and use of rail. mod A for BLE advancement, trunk elevation, and scooting forward to EOB.    Transfers Overall transfer level: Needs assistance Equipment used: Rolling walker (2 wheels) Transfers: Sit to/from Stand, Bed to chair/wheelchair/BSC Sit to Stand: Mod assist   Step pivot transfers: Mod assist, Max assist       [name] transfer comment: STS from EOB with mod A for power up. Slow, short steps with increased difficulty advancing RLE. Step by step cues for sequencing. Pt with progressively flexed trunk during transfer and unable to reach a full  upright posture. Pt with quick fatigue requiring increased assist to the chair prior to stepping all the way back    Ambulation/Gait               [name] Gait Details: unable   Stairs             Wheelchair Mobility     Tilt Bed    Modified Rankin (Stroke Patients Only) Modified Rankin (Stroke Patients Only) Pre-Morbid Rankin Score: Moderate disability Modified Rankin: Severe disability     Balance Overall balance assessment: Needs assistance Sitting-balance support: Bilateral upper extremity supported, Feet supported Sitting balance-Leahy Scale: Poor Sitting balance - Comments: CGA progressing to min A with posterior and R leans Postural control: Posterior lean, Right lateral lean Standing balance support: Bilateral upper extremity supported, During functional activity, Reliant on assistive device for balance Standing balance-Leahy Scale: Poor Standing balance comment: reliant on RW and external support                            Communication Communication Communication:  Impaired Factors Affecting Communication: Hearing impaired  Cognition Arousal: Alert Behavior During Therapy: Flat affect   PT - Cognitive impairments: Sequencing, Problem solving, Safety/Judgement, Memory, Awareness, Orientation                          Following commands: Impaired Following commands impaired: Follows one step commands with increased time    Cueing Cueing Techniques: Verbal cues, Gestural cues, Tactile cues  Exercises      [name] Comments        Pertinent Vitals/Pain Pain Assessment Pain Assessment: No/denies pain     PT Goals (current goals can now be found in the care plan section) Acute Rehab PT Goals Patient Stated Goal: unable to state PT Goal Formulation: Patient unable to participate in goal setting Time For Goal Achievement: 04/04/24 Progress towards PT goals: Progressing toward goals    Frequency    Min 2X/week       AM-PAC PT 6 Clicks Mobility   Outcome Measure  Help needed turning from your back to your side while in a flat bed without using bedrails?: A Lot Help needed moving from lying on your back to sitting on the side of a flat bed without using bedrails?: A Lot Help needed moving to and from a bed to a chair (including a wheelchair)?: A Lot Help needed standing up from a chair using your arms (e.g., wheelchair or bedside chair)?: A Lot Help needed to walk in hospital room?: Total Help needed climbing 3-5 steps with a railing? : Total 6 Click Score: 10    End of Session Equipment Utilized During Treatment: Gait belt Activity Tolerance: Patient limited by fatigue Patient left: with call bell/phone within reach;in chair;with family/visitor present Nurse Communication: Mobility status;Need for lift equipment PT Visit Diagnosis: Muscle weakness (generalized) (M62.81);Other abnormalities of gait and mobility (R26.89)     Time: 8870-8840 PT Time Calculation (min) (ACUTE ONLY): 30 min  Charges:    $Therapeutic Activity: 23-37 mins PT [name] Charges $$ ACUTE PT VISIT: 1 Visit                    Darryle George, PTA Acute Rehabilitation Services Secure Chat Preferred  [phone]    Darryle George 03/24/2024, 1:04 PM

## 2024-03-25 DIAGNOSIS — E039 Hypothyroidism, unspecified: Secondary | ICD-10-CM | POA: Diagnosis not present

## 2024-03-25 DIAGNOSIS — Z950 Presence of cardiac pacemaker: Secondary | ICD-10-CM | POA: Diagnosis not present

## 2024-03-25 DIAGNOSIS — I639 Cerebral infarction, unspecified: Secondary | ICD-10-CM | POA: Diagnosis not present

## 2024-03-25 DIAGNOSIS — Z66 Do not resuscitate: Secondary | ICD-10-CM | POA: Diagnosis not present

## 2024-03-25 MED ORDER — ENSURE PLUS HIGH PROTEIN PO LIQD
237.0000 mL | Freq: Two times a day (BID) | ORAL | Status: DC
  Administered 2024-03-25 – 2024-03-26 (×2): 237 mL via ORAL

## 2024-03-25 MED ORDER — ORAL CARE MOUTH RINSE: 15.0000 mL | OROMUCOSAL | Status: DC | PRN

## 2024-03-25 MED ORDER — AMIODARONE HCL 100 MG PO TABS: 100.0000 mg | ORAL_TABLET | Freq: Every day | ORAL | Status: DC

## 2024-03-25 MED ORDER — CARVEDILOL 3.125 MG PO TABS: 3.1250 mg | ORAL_TABLET | Freq: Two times a day (BID) | ORAL | Status: DC | PRN

## 2024-03-25 MED ORDER — INSULIN GLARGINE 100 UNIT/ML ~~LOC~~ SOLN: 5.0000 [IU] | Freq: Every day | SUBCUTANEOUS | Status: DC

## 2024-03-25 MED ORDER — MIDODRINE HCL 2.5 MG PO TABS: 2.5000 mg | ORAL_TABLET | Freq: Three times a day (TID) | ORAL | Status: DC | PRN

## 2024-03-25 MED ORDER — ASPIRIN 81 MG PO TBEC: 81.0000 mg | DELAYED_RELEASE_TABLET | Freq: Every day | ORAL | Status: DC

## 2024-03-25 NOTE — Plan of Care (Signed)
  Problem: Health Behavior/Discharge Planning: Goal: Ability to manage health-related needs will improve Outcome: Progressing   Problem: Clinical Measurements: Goal: Diagnostic test results will improve Outcome: Progressing   Problem: Coping: Goal: Level of anxiety will decrease Outcome: Progressing   Problem: Elimination: Goal: Will not experience complications related to urinary retention Outcome: Progressing

## 2024-03-25 NOTE — Progress Notes (Signed)
 PROGRESS NOTE    Thomas [name]  FMW:[id number] DOB: [birth year] DOA: 03/20/2024 PCP: Kennyth Worth HERO, MD  Subjective: Pt seen and examined. Met with pt's wife at bedside. Pt has insurance authorization and bed offer since yesterday. Awaiting for DC on Monday. All of wife's questions answered to best of my ability. Wife states pt has had excellent care at [facility].   Hospital Course: CC: AMS HPI: Thomas [name] is a 89 y.o. male with medical history significant for type 2 diabetes, hypertension, hypothyroidism, paroxysmal A-fib on Eliquis , sick sinus syndrome status post pacemaker placement, who presents to the ER due to altered mental status, generalized weakness since yesterday.  The patient uses a walker at baseline.  Today he was too weak to get out of bed.  Also with waxing and waning confusion.  Has had some urinary retention.  Denies dysuria or subjective fevers.   In the ER, tachypneic, alert and confused.  CT head and CT cervical spine were nonacute.  UA negative for pyuria.  Chest x-ray with no active disease.   ED Course: Temperature 97.5.  BP 141/76, pulse 70, respiratory rate 21, O2 saturation at 99% on room air.  Significant Events: Admitted 03/20/2024 for altered mental status   Admission Labs: VBG pH 7.423, PCO2 of 39 WBC 11, HgB 11.1, plt 152. MCV 103 NH3 of 18 Na 137, K 4.5, CO@ of 23, BUN 18, Scr 1.12, glu 136 T. Prot 6.5, Alb 3.3, AST 19, ALT 13, alk phos 47, t. Bili 0.3 UA negative  Admission Imaging Studies: CXR No active disease  CT head Extensive chronic white matter disease. Old right occipital infarct.  No acute abnormality. CT c-spine No evidence of acute cervical spine fracture, traumatic subluxation or static signs of instability. 2. Multilevel cervical spondylosis as described CTPA No evidence of pulmonary embolus. No acute cardiopulmonary disease. 4.2 cm ascending thoracic aortic aneurysm. Recommend annual imaging followup by CTA or MRA CT abd/pelvis  Cholelithiasis.  No CT evidence of acute cholecystitis. Diffuse colonic diverticulosis.  No active diverticulitis. Aortic atherosclerosis. Air within the urinary bladder, presumably from recent catheterization.  Significant Labs:   Significant Imaging Studies: MRI brain 2 cm acute left thalamic infarct. 2. Small acute to subacute left frontal lobe infarcts. 3. Severe chronic small vessel ischemic disease with multiple chronic infarcts as above. 4. Abnormal distal right vertebral artery which may reflect occlusion or slow flow  Antibiotic Therapy: Anti-infectives (From admission, onward)    None       Procedures:   Consultants:     Assessment and Plan: * Acute CVA (cerebrovascular accident) ([name]) 03/21/24 MRI brain showed acute CVA of 2 cm acute left thalamic infarct and Small acute to subacute left frontal lobe infarcts. Neurology consulted.  03/22/24 neurology following. CTA head/neck showed Extensive intracranial atherosclerosis. Remains on Eliquis . Neurology to decide if pt needs antiplatelet therapy.  03/23/24 nothing further from neurology. Echo shows LVEF 60%. No thrombus. Awaiting SNF placement. Son is aware of overall poor prognosis.  03/24/24 on ASA 81 mg daily. Crestor  20 mg at bedtime. Continue eliquis . Restart HTN meds.  03/25/24 stable. On ASA 81 qday, Eliquis  5 mg bid, crestor  20 mg at bedtime. Neurology has signed off. For DC to SNF tomorrow AM. Will prep DC orders.   History of pulmonary embolism 03/21/24 continue eliquis  for now.  Neurology to decide if risk hemorrhagic conversion is high enough to warrant cessation of Eliquis .  03/22/24 will continue Eliquis  for now until neurology says otherwise.  03/23/24 stable  03/24/24 continue eliquis .  03/25/24 continue eliquis  5 mg bid.    PAF (paroxysmal atrial fibrillation) ([name]) 03/21/24 continue with Eliquis  for now. Neurology to decide if risk hemorrhagic conversion is high enough to warrant cessation  of Eliquis .  03/22/24 will continue Eliquis  for now until neurology says otherwise.  03/23/24 stable  03/24/24 continue eliquis . Restart coreg .  03/25/24 continue eliquis  5 mg bid. Coreg  3.125 mg bid.   DNR (do not resuscitate) 03/21/24 confirmed with wife and son that pt would want to be a DNR. DNR form completed       Type 2 diabetes mellitus with hyperglycemia ([name]) 03/21/24 check A1c. Add SSI.  03/22/24 A1c of 8.1%. could use some better outpatient control of his DM but at age 44 y/o, not sure how much better he can get.  03/23/24 CBG stable  03/24/24 stable  03/25/24 stable. Will restart lower dose lantus  at SNF. Current CBG during hospitalization do not require long acting insulin . Continue with SSI   Cardiac pacemaker 03/21/24 chronic.  03/22/24 stable  03/23/24 stable  03/24/24 stable  03/25/24 stable. Chronic. Has MRI compatible PPM.   Pulse Gen Model: [id number] Azure XT DR MRI       Hypertension associated with diabetes ([name]) 03/21/24 allow for permissive HTN for now.  03/22/24 continue with permissive HTN for now.  Neurology to decided on when normotension should be achieved. Coreg  still on hold.  03/23/24 awaiting for neurology to give clearance to restart HTN meds.  03/24/24 restart coreg  3.125 mg bid. Hold on midodrine  for now.  03/25/24 on coreg  3.125 mg bid. No need for midodrine  right now. Only give if SBP <90    Acquired hypothyroidism 03/21/24 stable. Continue synthroid .  03/22/24 stable  03/23/24 stable  03/24/24 stable.  03/25/24 continue synthroid  112 mcg daily.   AMS (altered mental status)-resolved as of 03/25/2024 03/22/24 due to CVA. Not from infection. Resolved.   DVT prophylaxis:  apixaban  (ELIQUIS ) tablet 5 mg     Code Status: Limited: Do not attempt resuscitation (DNR) -DNR-LIMITED -Do Not Intubate/DNI  Family Communication: discussed with pt's wife diane at bedside Disposition Plan: SNF Reason for continuing  need for hospitalization: medically stable for DC.  Objective: Vitals:   03/24/24 1705 03/24/24 2025 03/25/24 0729 03/25/24 1344  BP: (!) 145/65 (!) 146/59 (!) 110/45 (!) 134/105  Pulse: 70 70 69 74  Resp: 16 15 15 16   Temp:  98.2 F (36.8 C) 97.7 F (36.5 C) 98.6 F (37 C)  TempSrc:   Oral   SpO2: 98%  95% 97%  Weight:      Height:        Intake/Output Summary (Last 24 hours) at 03/25/2024 1545 Last data filed at 03/25/2024 0900 Gross per 24 hour  Intake 240 ml  Output 2400 ml  Net -2160 ml   Filed Weights   03/21/24 1959  Weight: 79.2 kg    Examination:  Physical Exam Vitals and nursing note reviewed.  Constitutional:      Comments: asleep  HENT:     Head: Normocephalic and atraumatic.  Cardiovascular:     Comments: RRR on telemetry. paced Abdominal:     General: There is no distension.     Data Reviewed: I have personally reviewed following labs and imaging studies  CBC: Recent Labs  Lab 03/20/24 1440 03/20/24 1507 03/20/24 1508 03/24/24 0552  WBC 11.0*  --   --  10.8*  NEUTROABS 6.6  --   --   --   HGB  11.1* 10.5* 10.5* 10.7*  HCT 34.6* 31.0* 31.0* 32.3*  MCV 103.3*  --   --  100.3*  PLT 152  --   --  150   Basic Metabolic Panel: Recent Labs  Lab 03/20/24 1440 03/20/24 1507 03/20/24 1508 03/24/24 0552  NA 137 140 140 137  K 4.5 4.6 4.6 4.1  CL 105 107  --  102  CO2 23  --   --  24  GLUCOSE 136* 138*  --  188*  BUN 18 25*  --  28*  CREATININE 1.12 1.30*  --  1.33*  CALCIUM  8.4*  --   --  9.1   GFR: Estimated Creatinine Clearance: 33.6 mL/min (A) (by C-G formula based on SCr of 1.33 mg/dL (H)). Liver Function Tests: Recent Labs  Lab 03/20/24 1440  AST 19  ALT 13  ALKPHOS 47  BILITOT 0.3  PROT 6.5  ALBUMIN 3.3*    Recent Labs  Lab 03/20/24 1441  AMMONIA 18   Recent Results (from the past 240 hours)  Urine Culture     Status: None   Collection Time: 03/20/24  4:32 PM   Specimen: Urine, Clean Catch  Result Value Ref  Range Status   Specimen Description URINE, CLEAN CATCH  Final   Special Requests NONE  Final   Culture   Final    NO GROWTH Performed at [facility], [address], [place], [place] [address]    Report Status 03/21/2024 FINAL  Final    Scheduled Meds:  amiodarone   100 mg Oral Daily   apixaban   5 mg Oral BID   aspirin  EC  81 mg Oral Daily   carvedilol   3.125 mg Oral BID WC   feeding supplement  237 mL Oral BID BM   levothyroxine   112 mcg Oral Q0600   rosuvastatin   20 mg Oral QHS   tamsulosin   0.8 mg Oral QPC supper   Continuous Infusions:   LOS: 3 days   Time spent: 55 minutes  Camellia Door, DO  [facility]  03/25/2024, 3:45 PM

## 2024-03-26 ENCOUNTER — Other Ambulatory Visit: Payer: Self-pay | Admitting: Physician Assistant

## 2024-03-26 DIAGNOSIS — Z794 Long term (current) use of insulin: Secondary | ICD-10-CM | POA: Diagnosis not present

## 2024-03-26 DIAGNOSIS — I7121 Aneurysm of the ascending aorta, without rupture: Secondary | ICD-10-CM | POA: Diagnosis not present

## 2024-03-26 DIAGNOSIS — Z7901 Long term (current) use of anticoagulants: Secondary | ICD-10-CM | POA: Diagnosis not present

## 2024-03-26 DIAGNOSIS — N1832 Chronic kidney disease, stage 3b: Secondary | ICD-10-CM | POA: Diagnosis not present

## 2024-03-26 DIAGNOSIS — H9190 Unspecified hearing loss, unspecified ear: Secondary | ICD-10-CM | POA: Diagnosis not present

## 2024-03-26 DIAGNOSIS — E785 Hyperlipidemia, unspecified: Secondary | ICD-10-CM | POA: Diagnosis not present

## 2024-03-26 DIAGNOSIS — R339 Retention of urine, unspecified: Secondary | ICD-10-CM | POA: Diagnosis not present

## 2024-03-26 DIAGNOSIS — Z87891 Personal history of nicotine dependence: Secondary | ICD-10-CM | POA: Diagnosis not present

## 2024-03-26 DIAGNOSIS — H3530 Unspecified macular degeneration: Secondary | ICD-10-CM | POA: Diagnosis not present

## 2024-03-26 DIAGNOSIS — I2510 Atherosclerotic heart disease of native coronary artery without angina pectoris: Secondary | ICD-10-CM | POA: Diagnosis not present

## 2024-03-26 DIAGNOSIS — I480 Paroxysmal atrial fibrillation: Secondary | ICD-10-CM | POA: Diagnosis not present

## 2024-03-26 DIAGNOSIS — M1990 Unspecified osteoarthritis, unspecified site: Secondary | ICD-10-CM | POA: Diagnosis not present

## 2024-03-26 DIAGNOSIS — Z950 Presence of cardiac pacemaker: Secondary | ICD-10-CM | POA: Diagnosis not present

## 2024-03-26 DIAGNOSIS — I10 Essential (primary) hypertension: Secondary | ICD-10-CM | POA: Diagnosis not present

## 2024-03-26 DIAGNOSIS — E039 Hypothyroidism, unspecified: Secondary | ICD-10-CM | POA: Diagnosis not present

## 2024-03-26 DIAGNOSIS — Z66 Do not resuscitate: Secondary | ICD-10-CM | POA: Diagnosis not present

## 2024-03-26 DIAGNOSIS — I951 Orthostatic hypotension: Secondary | ICD-10-CM

## 2024-03-26 DIAGNOSIS — G4700 Insomnia, unspecified: Secondary | ICD-10-CM | POA: Diagnosis not present

## 2024-03-26 DIAGNOSIS — I639 Cerebral infarction, unspecified: Secondary | ICD-10-CM | POA: Diagnosis not present

## 2024-03-26 DIAGNOSIS — M6281 Muscle weakness (generalized): Secondary | ICD-10-CM | POA: Diagnosis not present

## 2024-03-26 DIAGNOSIS — I495 Sick sinus syndrome: Secondary | ICD-10-CM | POA: Diagnosis not present

## 2024-03-26 DIAGNOSIS — E1165 Type 2 diabetes mellitus with hyperglycemia: Secondary | ICD-10-CM | POA: Diagnosis not present

## 2024-03-26 DIAGNOSIS — N400 Enlarged prostate without lower urinary tract symptoms: Secondary | ICD-10-CM | POA: Diagnosis not present

## 2024-03-26 DIAGNOSIS — E559 Vitamin D deficiency, unspecified: Secondary | ICD-10-CM | POA: Diagnosis not present

## 2024-03-26 DIAGNOSIS — D649 Anemia, unspecified: Secondary | ICD-10-CM | POA: Diagnosis not present

## 2024-03-26 DIAGNOSIS — Z86711 Personal history of pulmonary embolism: Secondary | ICD-10-CM | POA: Diagnosis not present

## 2024-03-26 NOTE — Progress Notes (Signed)
 RN called report to [facility] [phone]. RN was transferred to receiving nurse to no avail. RN wrote a report and placed into discharge packet.

## 2024-03-26 NOTE — Discharge Summary (Signed)
 [facility] Phys[facility] Discharge Summary   [name]  Admit date:     03/20/2024  Discharge date: 03/26/2024  Attending Physician: LAURENCE, Kollin Udell [[id number]]  Discharge Physician: Camellia LAURENCE   PCP: Kennyth Worth HERO, MD  Admitted From: Home  Disposition:  [facility]  Recommendations for Outpatient Follow-up:  Follow up with PCP in 1-2 weeks Follow up with stroke clinic Outpatient referral to CTS for ascending thoracic aneurysm.  Home Health:No Equipment/Devices: None  Discharge Condition:Stable CODE STATUS:DNR/DNI Diet recommendation: Heart Healthy/Diabetic Fluid Restriction: None  Hospital Summary: CC: AMS HPI: Thomas [name] is a 89 y.o. male with medical history significant for type 2 diabetes, hypertension, hypothyroidism, paroxysmal A-fib on Eliquis , sick sinus syndrome status post pacemaker placement, who presents to the ER due to altered mental status, generalized weakness since yesterday.  The patient uses a walker at baseline.  Today he was too weak to get out of bed.  Also with waxing and waning confusion.  Has had some urinary retention.  Denies dysuria or subjective fevers.   In the ER, tachypneic, alert and confused.  CT head and CT cervical spine were nonacute.  UA negative for pyuria.  Chest x-ray with no active disease.   ED Course: Temperature 97.5.  BP 141/76, pulse 70, respiratory rate 21, O2 saturation at 99% on room air.  Significant Events: Admitted 03/20/2024 for altered mental status   Admission Labs: VBG pH 7.423, PCO2 of 39 WBC 11, HgB 11.1, plt 152. MCV 103 NH3 of 18 Na 137, K 4.5, CO@ of 23, BUN 18, Scr 1.12, glu 136 T. Prot 6.5, Alb 3.3, AST 19, ALT 13, alk phos 47, t. Bili 0.3 UA negative  Admission Imaging Studies: CXR No active disease  CT head Extensive chronic white matter disease. Old right occipital infarct.  No acute abnormality. CT c-spine No evidence of acute cervical spine fracture, traumatic subluxation or  static signs of instability. 2. Multilevel cervical spondylosis as described CTPA No evidence of pulmonary embolus. No acute cardiopulmonary disease. 4.2 cm ascending thoracic aortic aneurysm. Recommend annual imaging followup by CTA or MRA CT abd/pelvis Cholelithiasis.  No CT evidence of acute cholecystitis. Diffuse colonic diverticulosis.  No active diverticulitis. Aortic atherosclerosis. Air within the urinary bladder, presumably from recent catheterization.  Significant Labs:   Significant Imaging Studies: MRI brain 2 cm acute left thalamic infarct. 2. Small acute to subacute left frontal lobe infarcts. 3. Severe chronic small vessel ischemic disease with multiple chronic infarcts as above. 4. Abnormal distal right vertebral artery which may reflect occlusion or slow flow CTA head/neck Extensive intracranial atherosclerosis including moderate to severe bilateral paraclinoid ICA stenoses, severe bilateral ACA and MCA branch vessel stenoses, and severe bilateral PCA stenoses including an apparent short segment occlusion of the left PCA near the P1-P2 junction. 2. Cervical carotid atherosclerosis with streak artifact severely limiting assessment in the region of the carotid bifurcations. Possible significant proximal ICA stenoses bilaterally. Consider further evaluation with carotid ultrasound or neck MRA. 3. Limited assessment of the vertebral arteries in the neck as detailed above. Diminutive right vertebral artery which is heavily diseased intracranially with occlusion of the distal V4 segment. 4.  Aortic Atherosclerosis (ICD10-I70.0) Echo LVEF 60-65%, mildly reduced RV systolic function  Antibiotic Therapy: Anti-infectives (From admission, onward)    None       Procedures:   Consultants: neurology   Hospital Course by Problem: * Acute CVA (cerebrovascular accident) ([name]) 03/21/24 MRI brain showed acute CVA of 2 cm acute left  thalamic infarct and Small acute to subacute left frontal  lobe infarcts. Neurology consulted.  03/22/24 neurology following. CTA head/neck showed Extensive intracranial atherosclerosis. Remains on Eliquis . Neurology to decide if pt needs antiplatelet therapy.  03/23/24 nothing further from neurology. Echo shows LVEF 60%. No thrombus. Awaiting SNF placement. Son is aware of overall poor prognosis.  03/24/24 on ASA 81 mg daily. Crestor  20 mg at bedtime. Continue eliquis . Restart HTN meds.  03/25/24 stable. On ASA 81 qday, Eliquis  5 mg bid, crestor  20 mg at bedtime. Neurology has signed off. For DC to SNF tomorrow AM. Will prep DC orders.  03/26/24 stable for DC today.   History of pulmonary embolism 03/21/24 continue eliquis  for now.  Neurology to decide if risk hemorrhagic conversion is high enough to warrant cessation of Eliquis .  03/22/24 will continue Eliquis  for now until neurology says otherwise.  03/23/24 stable   03/24/24 continue eliquis .  03/25/24 continue eliquis  5 mg bid.  03/26/24 DC to SNF with Eliquis  5 mg bid.  PAF (paroxysmal atrial fibrillation) ([name]) 03/21/24 continue with Eliquis  for now. Neurology to decide if risk hemorrhagic conversion is high enough to warrant cessation of Eliquis .  03/22/24 will continue Eliquis  for now until neurology says otherwise.  03/23/24 stable  03/24/24 continue eliquis . Restart coreg .  03/25/24 continue eliquis  5 mg bid. Coreg  3.125 mg bid.  03/26/24 DC to SNF with eliquis  5 mg bid, coreg  3.125 mg bid.    DNR (do not resuscitate) 03/21/24 confirmed with wife and son that pt would want to be a DNR. DNR form completed       Type 2 diabetes mellitus with hyperglycemia ([name]) 03/21/24 check A1c. Add SSI.  03/22/24 A1c of 8.1%. could use some better outpatient control of his DM but at age 3 y/o, not sure how much better he can get.  03/23/24 CBG stable  03/24/24 stable  03/25/24 stable. Will restart lower dose lantus  at SNF. Current CBG during hospitalization do not  require long acting insulin . Continue with SSI  03/26/24 will decrease lantus  dose to 5 units QAM with breakfast.   Cardiac pacemaker - PPM and leads are MRI compatible. 03/21/24 chronic.  03/22/24 stable  03/23/24 stable  03/24/24 stable  03/25/24 stable. Chronic. Has MRI compatible PPM.   Pulse Gen Model: [id number] Azure XT DR MRI     03/26/24 stable. Chronic. PPM and leads are MRI compatible.    Hypertension associated with diabetes ([name]) 03/21/24 allow for permissive HTN for now.  03/22/24 continue with permissive HTN for now.  Neurology to decided on when normotension should be achieved. Coreg  still on hold.  03/23/24 awaiting for neurology to give clearance to restart HTN meds.  03/24/24 restart coreg  3.125 mg bid. Hold on midodrine  for now.  03/25/24 on coreg  3.125 mg bid. No need for midodrine  right now. Only give if SBP <90  03/26/24 DC to SNF on coreg  3.125 mg bid. Hold for SBP <140  Acquired hypothyroidism 03/21/24 stable. Continue synthroid .  03/22/24 stable  03/23/24 stable  03/24/24 stable.  03/25/24 continue synthroid  112 mcg daily.  03/26/24 DC to SNF with synthroid  112 mcg daily    Thoracic ascending aortic aneurysm 03/26/24 pt with 4.2 cm ascending thoracic aneursym. He will need outpatient referral to cardiothoracic surgery after discharge. Given his age of 40, he is unlikely to be a candidate for operative repair.   AMS (altered mental status)-resolved as of 03/25/2024 03/22/24 due to CVA. Not from infection. Resolved.     Discharge Diagnoses:  Principal Problem:   Acute CVA (cerebrovascular accident) [facility]) Active Problems:   Acquired hypothyroidism   Hypertension associated with diabetes [facility])   Cardiac pacemaker - PPM and leads are MRI compatible.   Type 2 diabetes mellitus with hyperglycemia ([name])   DNR (do not resuscitate)   PAF (paroxysmal atrial fibrillation) ([name])   History of pulmonary embolism   Thoracic ascending aortic  aneurysm   Discharge Instructions  Discharge Instructions     Ambulatory referral to Cardiothoracic Surgery   Complete by: As directed    4.2 cm ascending thoracic aneurysm   Ambulatory referral to Neurology   Complete by: As directed    Follow up with Jessica at [facility] in 4-6 weeks.  Patient is Jessica's Patient. Thanks.   Call MD for:  difficulty breathing, headache or visual disturbances   Complete by: As directed    Call MD for:  extreme fatigue   Complete by: As directed    Call MD for:  hives   Complete by: As directed    Call MD for:  persistant dizziness or light-headedness   Complete by: As directed    Call MD for:  persistant nausea and vomiting   Complete by: As directed    Call MD for:  redness, tenderness, or signs of infection (pain, swelling, redness, odor or green/yellow discharge around incision site)   Complete by: As directed    Call MD for:  severe uncontrolled pain   Complete by: As directed    Call MD for:  temperature >100.4   Complete by: As directed    Diet - low sodium heart healthy   Complete by: As directed    Diet Carb Modified   Complete by: As directed    Discharge instructions   Complete by: As directed    1. Follow up with your primary care provider in 1-2 weeks following discharge from hospital. 2. Follow up with neurology stroke clinic in 2-4 weeks.   Increase activity slowly   Complete by: As directed       Allergies as of 03/26/2024       Reactions   Amitriptyline Shortness Of Breath, Palpitations   Lyrica [pregabalin] Nausea And Vomiting   Pravachol [pravastatin] Other (See Comments)   Muscle cramps and aches    Effexor Xr [venlafaxine] Anxiety   Morphine Palpitations   Zolpidem Anxiety, Palpitations        Medication List     STOP taking these medications    gabapentin  100 MG capsule Commonly known as: NEURONTIN    ketoconazole  2 % cream Commonly known as: NIZORAL        TAKE these medications    acetaminophen   500 MG tablet Commonly known as: TYLENOL  Take 650 mg by mouth in the morning and at bedtime. Take one tablet (500mg ) by mouth twice per day in the morning and evening.   amiodarone  100 MG tablet Commonly known as: PACERONE  Take 1 tablet (100 mg total) by mouth daily. Take 1/2 tablet (100mg ) by mouth once daily What changed:  medication strength how much to take how to take this when to take this   apixaban  5 MG Tabs tablet Commonly known as: ELIQUIS  Take 5 mg by mouth 2 (two) times daily.   aspirin  EC 81 MG tablet Take 1 tablet (81 mg total) by mouth daily. Swallow whole.   carvedilol  3.125 MG tablet Commonly known as: COREG  Take 1 tablet (3.125 mg total) by mouth 2 (two) times daily as needed (hold if SBP <140).  What changed: reasons to take this   cholecalciferol  25 MCG (1000 UNIT) tablet Commonly known as: VITAMIN D3 Take 1,000 Units by mouth daily.   co-enzyme Q-10 50 MG capsule Take 50 mg by mouth daily.   furosemide  20 MG tablet Commonly known as: LASIX  Take 1 tablet (20 mg total) by mouth daily.   glucosamine-chondroitin 500-400 MG tablet Take 1 tablet by mouth in the morning and at bedtime.   insulin  glargine 100 UNIT/ML injection Commonly known as: LANTUS  Inject 0.05 mLs (5 Units total) into the skin daily after breakfast. What changed:  how much to take when to take this   levothyroxine  112 MCG tablet Commonly known as: SYNTHROID  TAKE 1 TABLET(112 MCG) BY MOUTH DAILY BEFORE BREAKFAST   midodrine  2.5 MG tablet Commonly known as: PROAMATINE  Take 1 tablet (2.5 mg total) by mouth 3 (three) times daily as needed (SBP <90). What changed:  when to take this reasons to take this   multivitamin tablet Take 1 tablet by mouth daily.   Red Yeast Rice 600 MG Caps Take 600 mg by mouth in the morning and at bedtime.   rosuvastatin  20 MG tablet Commonly known as: CRESTOR  Take 1 tablet (20 mg total) by mouth at bedtime.   SUPER B COMPLEX PO Take 1 tablet  by mouth in the morning.   tamsulosin  0.4 MG Caps capsule Commonly known as: FLOMAX  Take 2 capsules (0.8 mg total) by mouth daily after supper.        Contact information for follow-up providers     Whitfield Raisin, NP. Schedule an appointment as soon as possible for a visit in 1 month(s).   Specialty: Neurology Why: stroke clinic Contact information: [address] [place] [address] [phone]              Contact information for after-discharge care     Destination     [facility] .   Service: Skilled Nursing Contact information: [address] [place]  [address] [phone]                    Allergies  Allergen Reactions   Amitriptyline Shortness Of Breath and Palpitations   Lyrica [Pregabalin] Nausea And Vomiting   Pravachol [Pravastatin] Other (See Comments)    Muscle cramps and aches    Effexor Xr [Venlafaxine] Anxiety   Morphine Palpitations   Zolpidem Anxiety and Palpitations    Discharge Exam: Vitals:   03/25/24 1943 03/26/24 0501  BP: (!) 147/62 123/61  Pulse: 68 73  Resp: 15 16  Temp: 97.9 F (36.6 C) 98.9 F (37.2 C)  SpO2:  95%    Physical Exam Vitals and nursing note reviewed.  Constitutional:      Comments: Awake, elderly  HENT:     Head: Normocephalic and atraumatic.  Eyes:     General: No scleral icterus.    Comments: Slight crusty drainage from left eye. Non purulent.  Cardiovascular:     Rate and Rhythm: Normal rate and regular rhythm.  Pulmonary:     Effort: Pulmonary effort is normal.     Breath sounds: Normal breath sounds.  Abdominal:     General: Abdomen is flat. There is no distension.  Musculoskeletal:     Right lower leg: No edema.     Left lower leg: No edema.  Skin:    General: Skin is warm and dry.     Capillary Refill: Capillary refill takes less than 2  seconds.     The results of significant diagnostics from this hospitalization  (including imaging, microbiology, ancillary and laboratory) are listed below for reference.    Microbiology: Recent Results (from the past 240 hours)  Urine Culture     Status: None   Collection Time: 03/20/24  4:32 PM   Specimen: Urine, Clean Catch  Result Value Ref Range Status   Specimen Description URINE, CLEAN CATCH  Final   Special Requests NONE  Final   Culture   Final    NO GROWTH Performed at [facility], [address] [place], [place] [address]    Report Status 03/21/2024 FINAL  Final     Labs: BNP (last 3 results) No results for input(s): BNP in the last 8760 hours. Basic Metabolic Panel: Recent Labs  Lab 03/20/24 1440 03/20/24 1507 03/20/24 1508 03/24/24 0552  NA 137 140 140 137  K 4.5 4.6 4.6 4.1  CL 105 107  --  102  CO2 23  --   --  24  GLUCOSE 136* 138*  --  188*  BUN 18 25*  --  28*  CREATININE 1.12 1.30*  --  1.33*  CALCIUM  8.4*  --   --  9.1   Liver Function Tests: Recent Labs  Lab 03/20/24 1440  AST 19  ALT 13  ALKPHOS 47  BILITOT 0.3  PROT 6.5  ALBUMIN 3.3*    Recent Labs  Lab 03/20/24 1441  AMMONIA 18   CBC: Recent Labs  Lab 03/20/24 1440 03/20/24 1507 03/20/24 1508 03/24/24 0552  WBC 11.0*  --   --  10.8*  NEUTROABS 6.6  --   --   --   HGB 11.1* 10.5* 10.5* 10.7*  HCT 34.6* 31.0* 31.0* 32.3*  MCV 103.3*  --   --  100.3*  PLT 152  --   --  150   Urinalysis    Component Value Date/Time   COLORURINE YELLOW 03/20/2024 1632   APPEARANCEUR CLEAR 03/20/2024 1632   LABSPEC 1.015 03/20/2024 1632   PHURINE 5.0 03/20/2024 1632   GLUCOSEU 150 (A) 03/20/2024 1632   GLUCOSEU NEGATIVE 08/04/2023 1232   HGBUR NEGATIVE 03/20/2024 1632   BILIRUBINUR NEGATIVE 03/20/2024 1632   KETONESUR NEGATIVE 03/20/2024 1632   PROTEINUR NEGATIVE 03/20/2024 1632   UROBILINOGEN 0.2 08/04/2023 1232   NITRITE NEGATIVE 03/20/2024 1632   LEUKOCYTESUR NEGATIVE 03/20/2024 1632   Sepsis Labs Recent Labs  Lab 03/20/24 1440 03/24/24 0552   WBC 11.0* 10.8*    Procedures/Studies: ECHOCARDIOGRAM COMPLETE Result Date: 03/22/2024    ECHOCARDIOGRAM REPORT   Patient Name:   YUVAN MEDINGER Date of Exam: 03/22/2024 Medical Rec #:  [id number]       Height:       68.0 in Accession #:    [id number]      Weight:       174.6 lb Date of Birth:  [birth year]       BSA:          1.929 m Patient Age:    89 years        BP:           97/56 mmHg Patient Gender: M               HR:           70 bpm. Exam Location:  Inpatient Procedure: 2D Echo, Cardiac Doppler and Color Doppler (Both Spectral and Color  Flow Doppler were utilized during procedure). Indications:    Stroke  History:        Patient has prior history of Echocardiogram examinations, most                 recent 03/17/2021. CAD, Pacemaker; Risk Factors:Diabetes and                 Dyslipidemia.  Sonographer:    Philomena Daring Referring Phys: [phone] DENISE A WOLFE IMPRESSIONS  1. Left ventricular ejection fraction, by estimation, is 60 to 65%. The left ventricle has normal function. The left ventricle has no regional wall motion abnormalities. There is moderate concentric left ventricular hypertrophy. Left ventricular diastolic parameters are consistent with Grade I diastolic dysfunction (impaired relaxation).  2. Right ventricular systolic function is mildly reduced. The right ventricular size is normal. Tricuspid regurgitation signal is inadequate for assessing PA pressure.  3. The mitral valve is degenerative. No evidence of mitral valve regurgitation. No evidence of mitral stenosis.  4. The aortic valve is tricuspid. There is moderate calcification of the aortic valve. Aortic valve regurgitation is not visualized. Aortic valve sclerosis/calcification is present, without any evidence of aortic stenosis.  5. There is mild dilatation of the ascending aorta, measuring 44 mm.  6. The IVC was not visualized.  7. Technically difficult study with poor acoustic windows. FINDINGS  Left Ventricle: Left  ventricular ejection fraction, by estimation, is 60 to 65%. The left ventricle has normal function. The left ventricle has no regional wall motion abnormalities. The left ventricular internal cavity size was normal in size. There is  moderate concentric left ventricular hypertrophy. Left ventricular diastolic parameters are consistent with Grade I diastolic dysfunction (impaired relaxation). Right Ventricle: The right ventricular size is normal. Right vetricular wall thickness was not well visualized. Right ventricular systolic function is mildly reduced. Tricuspid regurgitation signal is inadequate for assessing PA pressure. Left Atrium: Left atrial size was normal in size. Right Atrium: Right atrial size was normal in size. Pericardium: There is no evidence of pericardial effusion. Mitral Valve: The mitral valve is degenerative in appearance. There is moderate thickening of the mitral valve leaflet(s). No evidence of mitral valve regurgitation. No evidence of mitral valve stenosis. Tricuspid Valve: The tricuspid valve is normal in structure. Tricuspid valve regurgitation is not demonstrated. Aortic Valve: The aortic valve is tricuspid. There is moderate calcification of the aortic valve. Aortic valve regurgitation is not visualized. Aortic valve sclerosis/calcification is present, without any evidence of aortic stenosis. Pulmonic Valve: The pulmonic valve was normal in structure. Pulmonic valve regurgitation is not visualized. Aorta: The aortic root is normal in size and structure. There is mild dilatation of the ascending aorta, measuring 44 mm. Venous: The IVC was not visualized. The inferior vena cava was not well visualized. IAS/Shunts: The interatrial septum was not well visualized. Additional Comments: A device lead is visualized in the right ventricle.  LEFT VENTRICLE PLAX 2D LVIDd:         3.20 cm   Diastology LVIDs:         2.20 cm   LV e' medial:    4.90 cm/s LV PW:         1.20 cm   LV E/e' medial:   17.0 LV IVS:        1.50 cm   LV e' lateral:   5.98 cm/s LVOT diam:     2.00 cm   LV E/e' lateral: 13.9 LV SV:  51 LV SV Index:   26 LVOT Area:     3.14 cm  RIGHT VENTRICLE RV S prime:     6.53 cm/s TAPSE (M-mode): 1.1 cm LEFT ATRIUM             Index        RIGHT ATRIUM           Index LA diam:        3.90 cm 2.02 cm/m   RA Area:     11.50 cm LA Vol (A2C):   50.2 ml 26.02 ml/m  RA Volume:   22.10 ml  11.46 ml/m LA Vol (A4C):   45.1 ml 23.38 ml/m LA Biplane Vol: 48.9 ml 25.35 ml/m  AORTIC VALVE LVOT Vmax:   75.20 cm/s LVOT Vmean:  48.600 cm/s LVOT VTI:    0.162 m  AORTA Ao Root diam: 3.10 cm Ao Asc diam:  4.40 cm MITRAL VALVE MV Area (PHT): 2.58 cm     SHUNTS MV Decel Time: 294 msec     Systemic VTI:  0.16 m MV E velocity: 83.30 cm/s   Systemic Diam: 2.00 cm MV A velocity: 100.00 cm/s MV E/A ratio:  0.83 Dalton McleanMD Electronically signed by Ezra Kanner Signature Date/Time: 03/22/2024/6:04:49 PM    Final    CT ANGIO HEAD NECK W WO CM Result Date: 03/21/2024 CLINICAL DATA:  Stroke/TIA, determine embolic source. Acute left thalamic infarct. EXAM: CT ANGIOGRAPHY HEAD AND NECK WITH AND WITHOUT CONTRAST TECHNIQUE: Multidetector CT imaging of the head and neck was performed using the standard protocol during bolus administration of intravenous contrast. Multiplanar CT image reconstructions and MIPs were obtained to evaluate the vascular anatomy. Carotid stenosis measurements (when applicable) are obtained utilizing NASCET criteria, using the distal internal carotid diameter as the denominator. RADIATION DOSE REDUCTION: This exam was performed according to the departmental dose-optimization program which includes automated exposure control, adjustment of the mA and/or kV according to patient size and/or use of iterative reconstruction technique. CONTRAST:  50mL OMNIPAQUE  IOHEXOL  350 MG/ML SOLN COMPARISON:  MRI head 03/21/2024.  CTA head and neck 08/31/2020. FINDINGS: CT HEAD FINDINGS Brain: An acute  infarct is noted in the ventral left thalamus as shown on MRI. The additional punctate acute/subacute left frontal cortical and subcortical infarcts on MRI are occult by CT. Confluent hypodensities in the cerebral white matter bilaterally are nonspecific but compatible with severe chronic small vessel ischemic disease. Chronic lacunar infarcts are again noted in the right basal ganglia, thalami, and cerebellum, and there are small chronic cortical infarcts in the right temporal lobe and right occipital lobe. There is mild cerebral atrophy. No acute intracranial hemorrhage, mass, midline shift, or extra-axial fluid collection is identified. Vascular: Calcified atherosclerosis at the skull base. Skull: No acute fracture or suspicious lesion. Sinuses/Orbits: Small mucous retention cyst in the right maxillary sinus. No significant mastoid fluid. Bilateral cataract extraction. Other: None. Review of the MIP images confirms the above findings CTA NECK FINDINGS Aortic arch: Normal variant aortic arch branching pattern with common origin of the brachiocephalic and left common carotid arteries. Mild atherosclerosis. Predominantly soft plaque in the proximal left subclavian artery resulting in luminal irregularity but no 50% or greater stenosis. Right carotid system: Patent with mixed plaque throughout the distal common and proximal internal carotid arteries. There may be a significant stenosis in the region of the ICA origin, however streak artifact from the cervical spine largely obscures the carotid bifurcation and proximal ICA. Retropharyngeal course of the proximal ICA. Mildly tortuous  distal cervical ICA. Left carotid system: Patent with mixed plaque throughout the common carotid artery and proximal ICA. Potentially severe stenosis of the ICA origin with assessment limited by streak artifact. Vertebral arteries: The left vertebral artery is patent and dominant with artifact from venous contrast, cervical spine hardware,  and motion limiting assessment for stenosis. The right vertebral artery is diminutive as previously shown, and its V1 and V2 segments are extremely poorly visualized due to the above described factors, and patency of the segments cannot be confirmed. The distal right V2 and V3 segments are patent. Skeleton: C3-4 ACDF. Exaggerated upper thoracic kyphosis. Advanced, widespread cervical facet arthrosis. Asymmetrically severe left C1-2 arthropathy. Solid bridging anterior vertebral osteophytes from C4-T1. Other neck: No gross cervical lymphadenopathy or mass. Upper chest: Clear lung apices. Partially visualized pacemaker and CABG. Review of the MIP images confirms the above findings CTA HEAD FINDINGS Anterior circulation: The internal carotid arteries are patent from skull base to carotid termini with extensive atherosclerotic calcification resulting in moderate to severe bilateral paraclinoid stenoses. ACAs and MCAs are patent proximally with mild to moderate bilateral M1 stenoses and numerous severe stenoses of ACA and MCA branch vessels bilaterally. There is proximal left A3 occlusion, and there are also are likely multiple smaller, more distal ACA branch vessel occlusions. No aneurysm is identified. Posterior circulation: The intracranial left vertebral artery is patent with up to moderate atherosclerotic narrowing. The intracranial right vertebral artery is heavily diseased proximally with severe narrowing and decreased opacification (progressed from the prior CTA) but appears faintly patent through the PICA origin and occluded beyond this. The basilar artery is patent and diffusely irregular with up to moderate stenosis. There are large right and diminutive or absent left posterior communicating arteries with hypoplasia or aplasia of the right P1 segment. The right PCA is patent with mild proximal and severe distal P2 stenoses. There is a 4 mm gap in the proximal left PCA near the P1-P2 junction which may reflect  a short segment occlusion or severe stenosis with return of a normal caliber P2 segment beyond this. There is additional atherosclerosis including severe mid and distal left P2 and proximal P3 stenoses. There are also segmental occlusions and/or severe stenoses involving and inferior right P3 branch. No aneurysm is identified. Venous sinuses: Patent. Anatomic variants: Hypoplastic right A1 segment. Review of the MIP images confirms the above findings IMPRESSION: 1. Extensive intracranial atherosclerosis including moderate to severe bilateral paraclinoid ICA stenoses, severe bilateral ACA and MCA branch vessel stenoses, and severe bilateral PCA stenoses including an apparent short segment occlusion of the left PCA near the P1-P2 junction. 2. Cervical carotid atherosclerosis with streak artifact severely limiting assessment in the region of the carotid bifurcations. Possible significant proximal ICA stenoses bilaterally. Consider further evaluation with carotid ultrasound or neck MRA. 3. Limited assessment of the vertebral arteries in the neck as detailed above. Diminutive right vertebral artery which is heavily diseased intracranially with occlusion of the distal V4 segment. 4.  Aortic Atherosclerosis (ICD10-I70.0). Electronically Signed   By: Dasie Hamburg M.D.   On: 03/21/2024 17:59   MR BRAIN WO CONTRAST Result Date: 03/21/2024 CLINICAL DATA:  Neuro deficit, acute, stroke suspected. Altered mental status. EXAM: MRI HEAD WITHOUT CONTRAST TECHNIQUE: Multiplanar, multiecho pulse sequences of the brain and surrounding structures were obtained without intravenous contrast. COMPARISON:  Head CT 03/20/2024 and MRI 09/01/2020 FINDINGS: Brain: There is a 2 cm acute infarct involving the ventral left thalamus and internal capsule. A few subcentimeter foci of milder restricted  diffusion involving cortex and white matter in the left frontal lobes are suggestive of acute to subacute infarcts. Confluent T2 hyperintensities in  the cerebral white matter bilaterally are similar in extent to the prior MRI and are nonspecific but compatible with severe chronic small vessel ischemic disease. Small chronic cortical and subcortical infarcts are noted in the right temporal and right occipital lobes. There are chronic lacunar infarcts in the right basal ganglia, thalami, and both cerebellar hemispheres with many of the cerebellar infarcts being new from the prior MRI. Chronic small-vessel changes in the pons have also slightly progressed from the prior MRI. A chronic microhemorrhage is noted in the right cerebellar hemisphere. There is mild cerebral atrophy. No mass, midline shift, or extra-axial fluid collection is identified. Vascular: Abnormal appearance of the distal right vertebral artery, partially chronic but progressed from 2022 and which may reflect occlusion or slow flow. Skull and upper cervical spine: No suspicious marrow lesion. Partially visualized postoperative and degenerative changes in the cervical spine. Sinuses/Orbits: Bilateral cataract extraction. Small mucous retention cyst in the right maxillary sinus. No significant mastoid fluid. Other: None. IMPRESSION: 1. 2 cm acute left thalamic infarct. 2. Small acute to subacute left frontal lobe infarcts. 3. Severe chronic small vessel ischemic disease with multiple chronic infarcts as above. 4. Abnormal distal right vertebral artery which may reflect occlusion or slow flow. Electronically Signed   By: Dasie Hamburg M.D.   On: 03/21/2024 12:57   CT ABDOMEN PELVIS W CONTRAST Result Date: 03/20/2024 CLINICAL DATA:  Epigastric pain, altered mental status. EXAM: CT ABDOMEN AND PELVIS WITH CONTRAST TECHNIQUE: Multidetector CT imaging of the abdomen and pelvis was performed using the standard protocol following bolus administration of intravenous contrast. RADIATION DOSE REDUCTION: This exam was performed according to the departmental dose-optimization program which includes automated  exposure control, adjustment of the mA and/or kV according to patient size and/or use of iterative reconstruction technique. CONTRAST:  80mL OMNIPAQUE  IOHEXOL  350 MG/ML SOLN COMPARISON:  None Available. FINDINGS: Lower chest: See chest CT report. Hepatobiliary: Layering gallstones within the gallbladder. No biliary ductal dilatation. No focal hepatic abnormality. Pancreas: No focal abnormality or ductal dilatation. Spleen: No focal abnormality.  Normal size. Adrenals/Urinary Tract: Air within the urinary bladder, presumably from recent catheterization. Recommend clinical correlation. No hydronephrosis. No suspicious renal or adrenal abnormality. Stomach/Bowel: Diffuse colonic diverticulosis. No active diverticulitis. No bowel obstruction or inflammatory process. Vascular/Lymphatic: Aortic atherosclerosis. No evidence of aneurysm or adenopathy. Reproductive: No visible focal abnormality. Other: No free fluid or free air. Musculoskeletal: No acute bony abnormality. IMPRESSION: Cholelithiasis.  No CT evidence of acute cholecystitis. Diffuse colonic diverticulosis.  No active diverticulitis. Aortic atherosclerosis. Air within the urinary bladder, presumably from recent catheterization. Recommend clinical correlation. Electronically Signed   By: Franky Crease M.D.   On: 03/20/2024 21:34   CT Angio Chest PE W/Cm &/Or Wo Cm Result Date: 03/20/2024 CLINICAL DATA:  Pulmonary embolism (PE) suspected, high prob. Altered mental status. EXAM: CT ANGIOGRAPHY CHEST WITH CONTRAST TECHNIQUE: Multidetector CT imaging of the chest was performed using the standard protocol during bolus administration of intravenous contrast. Multiplanar CT image reconstructions and MIPs were obtained to evaluate the vascular anatomy. RADIATION DOSE REDUCTION: This exam was performed according to the departmental dose-optimization program which includes automated exposure control, adjustment of the mA and/or kV according to patient size and/or use of  iterative reconstruction technique. CONTRAST:  80mL OMNIPAQUE  IOHEXOL  350 MG/ML SOLN COMPARISON:  None Available. FINDINGS: Cardiovascular: Heart is normal size. Prior CABG. Mild  aneurysmal dilatation of the ascending thoracic aorta measuring 4.2 cm. Aortic atherosclerosis. No dissection. No filling defects in the pulmonary arteries to suggest pulmonary emboli. Mediastinum/Nodes: No mediastinal, hilar, or axillary adenopathy. Trachea and esophagus are unremarkable. Thyroid  unremarkable. Moderate-sized hiatal hernia. Lungs/Pleura: No confluent airspace opacities or effusions. Upper Abdomen: Small layering gallstones within the gallbladder. No acute findings. Musculoskeletal: Chest wall soft tissues are unremarkable. No acute bony abnormality. Review of the MIP images confirms the above findings. IMPRESSION: No evidence of pulmonary embolus. No acute cardiopulmonary disease. 4.2 cm ascending thoracic aortic aneurysm. Recommend annual imaging followup by CTA or MRA. This recommendation follows 2010 ACCF/AHA/AATS/ACR/ASA/SCA/SCAI/SIR/STS/SVM Guidelines for the Diagnosis and Management of Patients with Thoracic Aortic Disease. Circulation. 2010; 121: [id number]. Aortic aneurysm NOS (ICD10-I71.9) Cholelithiasis. Aortic Atherosclerosis (ICD10-I70.0). Electronically Signed   By: Franky Crease M.D.   On: 03/20/2024 21:31   CT Head Wo Contrast Result Date: 03/20/2024 CLINICAL DATA:  Altered mental status EXAM: CT HEAD WITHOUT CONTRAST TECHNIQUE: Contiguous axial images were obtained from the base of the skull through the vertex without intravenous contrast. RADIATION DOSE REDUCTION: This exam was performed according to the departmental dose-optimization program which includes automated exposure control, adjustment of the mA and/or kV according to patient size and/or use of iterative reconstruction technique. COMPARISON:  MRI September 01, 2020 FINDINGS: CT HEAD: There is extensive chronic low attenuation in the cerebral white  matter. Old right occipital infarct. There is no hemorrhage. No acute ischemic changes. No mass lesion. The ventricles are normal. Skull/sinuses/orbits: No significant abnormality. IMPRESSION: Extensive chronic white matter disease. Old right occipital infarct. No acute abnormality. Electronically Signed   By: Nancyann Burns M.D.   On: 03/20/2024 16:13   CT Cervical Spine Wo Contrast Result Date: 03/20/2024 CLINICAL DATA:  Neck trauma. Altered mental status since yesterday. Urinary retention. EXAM: CT CERVICAL SPINE WITHOUT CONTRAST TECHNIQUE: Multidetector CT imaging of the cervical spine was performed without intravenous contrast. Multiplanar CT image reconstructions were also generated. RADIATION DOSE REDUCTION: This exam was performed according to the departmental dose-optimization program which includes automated exposure control, adjustment of the mA and/or kV according to patient size and/or use of iterative reconstruction technique. COMPARISON:  CT cervical spine 10/10/2021. FINDINGS: Alignment: Exaggerated thoracic kyphosis with similar mild stepwise degenerative anterolisthesis at C4-5, C5-6, C6-7 and C7-T1. Skull base and vertebrae: No evidence of acute cervical spine fracture or traumatic subluxation. Postsurgical changes from C3-4 ACDF. There is multilevel spondylosis with partially bridging anterior osteophytes from C5 through T1. Multilevel facet arthropathy. Soft tissues and spinal canal: No prevertebral fluid or swelling. No visible canal hematoma. Disc levels: Multilevel spondylosis with uncinate spurring and facet hypertrophy. There are advanced degenerative changes at the lateral C1-2 articulations, greater on the left. There is multilevel mild-to-moderate foraminal narrowing which appears chronic. Upper chest: Clear lung apices. Left subclavian pacemaker leads noted. Other: None. IMPRESSION: 1. No evidence of acute cervical spine fracture, traumatic subluxation or static signs of instability.  2. Multilevel cervical spondylosis as described. Electronically Signed   By: Elsie Perone M.D.   On: 03/20/2024 16:05   DG Chest Portable 1 View Result Date: 03/20/2024 CLINICAL DATA:  Altered mental status. EXAM: PORTABLE CHEST 1 VIEW COMPARISON:  10/10/2021. FINDINGS: Low lung volume. Mildly elevated right hemidiaphragm. Bilateral lung fields are clear. Bilateral costophrenic angles are clear. Stable cardio-mediastinal silhouette. There is a left sided 2-lead pacemaker. There are surgical staples along the heart border and sternotomy wires, status post CABG (coronary artery bypass graft). No acute osseous abnormalities.  Multiple old healed left rib fractures noted. The soft tissues are within normal limits. IMPRESSION: No active disease. Electronically Signed   By: Ree Molt M.D.   On: 03/20/2024 15:27    Time coordinating discharge: 55 mins  SIGNED:  Camellia Door, DO [facility] 03/26/24, 9:05 AM

## 2024-03-26 NOTE — Progress Notes (Signed)
 PROGRESS NOTE    Thomas [name]  FMW:[id number] DOB: March 04, 1929 DOA: 03/20/2024 PCP: Kennyth Worth HERO, MD  Subjective: Pt seen and examined. Ready for DC. SNF bed available and Ins authorization obtained. No family at bedside.   Hospital Course: CC: AMS HPI: Thomas [name] is a 89 y.o. male with medical history significant for type 2 diabetes, hypertension, hypothyroidism, paroxysmal A-fib on Eliquis , sick sinus syndrome status post pacemaker placement, who presents to the ER due to altered mental status, generalized weakness since yesterday.  The patient uses a walker at baseline.  Today he was too weak to get out of bed.  Also with waxing and waning confusion.  Has had some urinary retention.  Denies dysuria or subjective fevers.   In the ER, tachypneic, alert and confused.  CT head and CT cervical spine were nonacute.  UA negative for pyuria.  Chest x-ray with no active disease.   ED Course: Temperature 97.5.  BP 141/76, pulse 70, respiratory rate 21, O2 saturation at 99% on room air.  Significant Events: Admitted 03/20/2024 for altered mental status   Admission Labs: VBG pH 7.423, PCO2 of 39 WBC 11, HgB 11.1, plt 152. MCV 103 NH3 of 18 Na 137, K 4.5, CO@ of 23, BUN 18, Scr 1.12, glu 136 T. Prot 6.5, Alb 3.3, AST 19, ALT 13, alk phos 47, t. Bili 0.3 UA negative  Admission Imaging Studies: CXR No active disease  CT head Extensive chronic white matter disease. Old right occipital infarct.  No acute abnormality. CT c-spine No evidence of acute cervical spine fracture, traumatic subluxation or static signs of instability. 2. Multilevel cervical spondylosis as described CTPA No evidence of pulmonary embolus. No acute cardiopulmonary disease. 4.2 cm ascending thoracic aortic aneurysm. Recommend annual imaging followup by CTA or MRA CT abd/pelvis Cholelithiasis.  No CT evidence of acute cholecystitis. Diffuse colonic diverticulosis.  No active diverticulitis. Aortic atherosclerosis.  Air within the urinary bladder, presumably from recent catheterization.  Significant Labs:   Significant Imaging Studies: MRI brain 2 cm acute left thalamic infarct. 2. Small acute to subacute left frontal lobe infarcts. 3. Severe chronic small vessel ischemic disease with multiple chronic infarcts as above. 4. Abnormal distal right vertebral artery which may reflect occlusion or slow flow  Antibiotic Therapy: Anti-infectives (From admission, onward)    None       Procedures:   Consultants:     Assessment and Plan: * Acute CVA (cerebrovascular accident) ([name]) 03/21/24 MRI brain showed acute CVA of 2 cm acute left thalamic infarct and Small acute to subacute left frontal lobe infarcts. Neurology consulted.  03/22/24 neurology following. CTA head/neck showed Extensive intracranial atherosclerosis. Remains on Eliquis . Neurology to decide if pt needs antiplatelet therapy.  03/23/24 nothing further from neurology. Echo shows LVEF 60%. No thrombus. Awaiting SNF placement. Son is aware of overall poor prognosis.  03/24/24 on ASA 81 mg daily. Crestor  20 mg at bedtime. Continue eliquis . Restart HTN meds.  03/25/24 stable. On ASA 81 qday, Eliquis  5 mg bid, crestor  20 mg at bedtime. Neurology has signed off. For DC to SNF tomorrow AM. Will prep DC orders.  03/26/24 stable for DC today.   History of pulmonary embolism 03/21/24 continue eliquis  for now.  Neurology to decide if risk hemorrhagic conversion is high enough to warrant cessation of Eliquis .  03/22/24 will continue Eliquis  for now until neurology says otherwise.  03/23/24 stable   03/24/24 continue eliquis .  03/25/24 continue eliquis  5 mg bid.  03/26/24 DC to SNF with Eliquis   5 mg bid.  PAF (paroxysmal atrial fibrillation) ([name]) 03/21/24 continue with Eliquis  for now. Neurology to decide if risk hemorrhagic conversion is high enough to warrant cessation of Eliquis .  03/22/24 will continue Eliquis  for now until  neurology says otherwise.  03/23/24 stable  03/24/24 continue eliquis . Restart coreg .  03/25/24 continue eliquis  5 mg bid. Coreg  3.125 mg bid.  03/26/24 DC to SNF with eliquis  5 mg bid, coreg  3.125 mg bid.    DNR (do not resuscitate) 03/21/24 confirmed with wife and son that pt would want to be a DNR. DNR form completed       Type 2 diabetes mellitus with hyperglycemia ([name]) 03/21/24 check A1c. Add SSI.  03/22/24 A1c of 8.1%. could use some better outpatient control of his DM but at age 50 y/o, not sure how much better he can get.  03/23/24 CBG stable  03/24/24 stable  03/25/24 stable. Will restart lower dose lantus  at SNF. Current CBG during hospitalization do not require long acting insulin . Continue with SSI  03/26/24 will decrease lantus  dose to 5 units QAM with breakfast.   Cardiac pacemaker - PPM and leads are MRI compatible. 03/21/24 chronic.  03/22/24 stable  03/23/24 stable  03/24/24 stable  03/25/24 stable. Chronic. Has MRI compatible PPM.   Pulse Gen Model: [id number] Azure XT DR MRI     03/26/24 stable. Chronic. PPM and leads are MRI compatible.    Hypertension associated with diabetes ([name]) 03/21/24 allow for permissive HTN for now.  03/22/24 continue with permissive HTN for now.  Neurology to decided on when normotension should be achieved. Coreg  still on hold.  03/23/24 awaiting for neurology to give clearance to restart HTN meds.  03/24/24 restart coreg  3.125 mg bid. Hold on midodrine  for now.  03/25/24 on coreg  3.125 mg bid. No need for midodrine  right now. Only give if SBP <90  03/26/24 DC to SNF on coreg  3.125 mg bid. Hold for SBP <140  Acquired hypothyroidism 03/21/24 stable. Continue synthroid .  03/22/24 stable  03/23/24 stable  03/24/24 stable.  03/25/24 continue synthroid  112 mcg daily.  03/26/24 DC to SNF with synthroid  112 mcg daily    AMS (altered mental status)-resolved as of 03/25/2024 03/22/24 due to CVA. Not from  infection. Resolved.   DVT prophylaxis:  apixaban  (ELIQUIS ) tablet 5 mg     Code Status: Limited: Do not attempt resuscitation (DNR) -DNR-LIMITED -Do Not Intubate/DNI  Family Communication: no family at bedside. Talked with wife yesterday Disposition Plan: SNF Reason for continuing need for hospitalization: stable for DC.  Objective: Vitals:   03/25/24 1344 03/25/24 1701 03/25/24 1943 03/26/24 0501  BP: (!) 134/105 (!) 154/59 (!) 147/62 123/61  Pulse: 74 71 68 73  Resp: 16 16 15 16   Temp: 98.6 F (37 C)  97.9 F (36.6 C) 98.9 F (37.2 C)  TempSrc:    Oral  SpO2: 97% 96%  95%  Weight:      Height:        Intake/Output Summary (Last 24 hours) at 03/26/2024 0856 Last data filed at 03/26/2024 0500 Gross per 24 hour  Intake 720 ml  Output 1350 ml  Net -630 ml   Filed Weights   03/21/24 1959  Weight: 79.2 kg    Examination:  Physical Exam Vitals and nursing note reviewed.  Constitutional:      Comments: Awake, elderly  HENT:     Head: Normocephalic and atraumatic.  Eyes:     General: No scleral icterus.    Comments: Slight crusty  drainage from left eye. Non purulent.  Cardiovascular:     Rate and Rhythm: Normal rate and regular rhythm.  Pulmonary:     Effort: Pulmonary effort is normal.     Breath sounds: Normal breath sounds.  Abdominal:     General: Abdomen is flat. There is no distension.  Musculoskeletal:     Right lower leg: No edema.     Left lower leg: No edema.  Skin:    General: Skin is warm and dry.     Capillary Refill: Capillary refill takes less than 2 seconds.     Data Reviewed: I have personally reviewed following labs and imaging studies  CBC: Recent Labs  Lab 03/20/24 1440 03/20/24 1507 03/20/24 1508 03/24/24 0552  WBC 11.0*  --   --  10.8*  NEUTROABS 6.6  --   --   --   HGB 11.1* 10.5* 10.5* 10.7*  HCT 34.6* 31.0* 31.0* 32.3*  MCV 103.3*  --   --  100.3*  PLT 152  --   --  150   Basic Metabolic Panel: Recent Labs  Lab  03/20/24 1440 03/20/24 1507 03/20/24 1508 03/24/24 0552  NA 137 140 140 137  K 4.5 4.6 4.6 4.1  CL 105 107  --  102  CO2 23  --   --  24  GLUCOSE 136* 138*  --  188*  BUN 18 25*  --  28*  CREATININE 1.12 1.30*  --  1.33*  CALCIUM  8.4*  --   --  9.1   GFR: Estimated Creatinine Clearance: 33.6 mL/min (A) (by C-G formula based on SCr of 1.33 mg/dL (H)). Liver Function Tests: Recent Labs  Lab 03/20/24 1440  AST 19  ALT 13  ALKPHOS 47  BILITOT 0.3  PROT 6.5  ALBUMIN 3.3*    Recent Labs  Lab 03/20/24 1441  AMMONIA 18   Recent Results (from the past 240 hours)  Urine Culture     Status: None   Collection Time: 03/20/24  4:32 PM   Specimen: Urine, Clean Catch  Result Value Ref Range Status   Specimen Description URINE, CLEAN CATCH  Final   Special Requests NONE  Final   Culture   Final    NO GROWTH Performed at [facility], [address], [place], [place] [address]    Report Status 03/21/2024 FINAL  Final    Scheduled Meds:  amiodarone   100 mg Oral Daily   apixaban   5 mg Oral BID   aspirin  EC  81 mg Oral Daily   carvedilol   3.125 mg Oral BID WC   feeding supplement  237 mL Oral BID BM   levothyroxine   112 mcg Oral Q0600   rosuvastatin   20 mg Oral QHS   tamsulosin   0.8 mg Oral QPC supper   Continuous Infusions:   LOS: 4 days   Time spent: 55 minutes  Camellia Door, DO  [facility]  03/26/2024, 8:56 AM

## 2024-03-26 NOTE — Plan of Care (Signed)
  Problem: Health Behavior/Discharge Planning: Goal: Ability to manage health-related needs will improve Outcome: Progressing   Problem: Skin Integrity: Goal: Risk for impaired skin integrity will decrease Outcome: Progressing   Problem: Coping: Goal: Will verbalize positive feelings about self Outcome: Progressing   Problem: Health Behavior/Discharge Planning: Goal: Ability to manage health-related needs will improve Outcome: Progressing

## 2024-03-26 NOTE — Care Management Important Message (Signed)
 Important Message  Patient Details  Name: Marlon Suleiman MRN: [id number] Date of Birth: 01-Mar-1929   Important Message Given:  Yes - Medicare IM     Jon Cruel 03/26/2024, 12:58 PM

## 2024-03-26 NOTE — TOC Progression Note (Signed)
 Transition of Care [facility]) - Progression Note    Patient Details  Name: Thomas [name] MRN: [id number] Date of Birth: 1929/03/02  [facility] CM/SW Contact  Bridget Cordella Simmonds, LCSW Phone Number: 03/26/2024, 8:47 AM  Clinical Narrative:   CSW confirmed with [facility] that they can receive pt today.  MD notified.    Expected Discharge Plan: Skilled Nursing Facility Barriers to Discharge: Continued Medical Work up, SNF Pending bed offer               Expected Discharge Plan and Services In-house Referral: Clinical Social Work   Post Acute Care Choice: Skilled Nursing Facility Living arrangements for the past 2 months: Single Family Home Expected Discharge Date: 03/26/24                                     Social Drivers of Health (SDOH) Interventions SDOH Screenings   Food Insecurity: No Food Insecurity (03/21/2024)  Housing: Low Risk  (03/21/2024)  Transportation Needs: No Transportation Needs (03/21/2024)  Utilities: Not At Risk (03/21/2024)  Alcohol Screen: Low Risk  (10/10/2023)  Depression (PHQ2-9): Low Risk  (02/06/2024)  Financial Resource Strain: Low Risk  (10/10/2023)  Physical Activity: Insufficiently Active (10/10/2023)  Social Connections: Patient Declined (03/21/2024)  Stress: No Stress Concern Present (10/10/2023)  Tobacco Use: Medium Risk (03/21/2024)  Health Literacy: Adequate Health Literacy (10/10/2023)    Readmission Risk Interventions     No data to display

## 2024-03-26 NOTE — Progress Notes (Signed)
 [facility] here to pick up patient, discharge paperwork given to [facility], patient discharging to [facility], report given to facility by dayshift nurse.

## 2024-03-26 NOTE — TOC Transition Note (Addendum)
 Transition of Care [facility]) - Discharge Note   Patient Details  Name: Thomas [name] MRN: [id number] Date of Birth: Oct 07, 1928  Transition of Care [facility]) CM/SW Contact:  Bridget Cordella Simmonds, LCSW Phone Number: 03/26/2024, 10:56 AM   Clinical Narrative:   Pt discharging to [facility], room 26, RN report to [phone].  PTAR called [phone].   1800: CSW called ptar, they are unsure why pt was not picked up, a truck has been assigned, will be picked up.  Final next level of care: Skilled Nursing Facility Barriers to Discharge: Barriers Resolved   Patient Goals and CMS Choice   CMS Medicare.gov Compare Post Acute Care list provided to:: Patient Represenative (must comment) (wife and son) Choice offered to / list presented to : Spouse, Adult Children      Discharge Placement              Patient chooses bed at: [facility] Patient to be transferred to facility by: ptar Name of family member notified: son [name] Hezzie) Patient and family notified of of transfer: 03/26/24  Discharge Plan and Services Additional resources added to the After Visit Summary for   In-house Referral: Clinical Social Work   Post Acute Care Choice: Skilled Nursing Facility                               Social Drivers of Health (SDOH) Interventions SDOH Screenings   Food Insecurity: No Food Insecurity (03/21/2024)  Housing: Low Risk  (03/21/2024)  Transportation Needs: No Transportation Needs (03/21/2024)  Utilities: Not At Risk (03/21/2024)  Alcohol Screen: Low Risk  (10/10/2023)  Depression (PHQ2-9): Low Risk  (02/06/2024)  Financial Resource Strain: Low Risk  (10/10/2023)  Physical Activity: Insufficiently Active (10/10/2023)  Social Connections: Patient Declined (03/21/2024)  Stress: No Stress Concern Present (10/10/2023)  Tobacco Use: Medium Risk (03/21/2024)  Health Literacy: Adequate Health Literacy (10/10/2023)     Readmission Risk Interventions     No data to display

## 2024-03-26 NOTE — Assessment & Plan Note (Signed)
 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 He will need outpatient referral to cardiothoracic surgery after discharge. Given his age of 37, he is unlikely to be a candidate for operative repair.

## 2024-03-27 ENCOUNTER — Other Ambulatory Visit: Payer: Self-pay

## 2024-03-27 DIAGNOSIS — I951 Orthostatic hypotension: Secondary | ICD-10-CM

## 2024-03-27 DIAGNOSIS — Z79899 Other long term (current) drug therapy: Secondary | ICD-10-CM

## 2024-03-27 DIAGNOSIS — I480 Paroxysmal atrial fibrillation: Secondary | ICD-10-CM

## 2024-03-28 NOTE — Telephone Encounter (Signed)
*  STAT* If patient is at the pharmacy, call can be transferred to refill team.   1. Which medications need to be refilled? (please list name of each medication and dose if known)   midodrine  (PROAMATINE ) 2.5 MG tablet  rosuvastatin  (CRESTOR ) 20 MG tablet   2. Would you like to learn more about the convenience, safety, & potential cost savings by using the [facility]?   3. Are you open to using the [facility] (Type [facility]. ).  4. Which pharmacy/location (including street and city if local pharmacy) is medication to be sent to?  [id number] - [place], [place] - [address]   5. Do they need a 30 day or 90 day supply?   90 day  Caller [facility]) stated patient is completely out of this medication.

## 2024-03-28 NOTE — Telephone Encounter (Signed)
 Former Dr. Alveta pt. Pt saw Reche Finder, NP. Pt's pharmacy is requesting a refill on medication midodrine . Would Reche Finder, NP like to refill this medication? Please address

## 2024-03-28 NOTE — Telephone Encounter (Signed)
 Former Dr. Alveta pt. Thomas Finder, NP saw pt as well. Would she like to prescribe medications for pt asking pt to schedule an overdue appt with new Cardiologist before anymore refills? Please address

## 2024-03-29 DIAGNOSIS — I639 Cerebral infarction, unspecified: Secondary | ICD-10-CM | POA: Diagnosis not present

## 2024-03-29 DIAGNOSIS — I480 Paroxysmal atrial fibrillation: Secondary | ICD-10-CM | POA: Diagnosis not present

## 2024-03-29 DIAGNOSIS — I10 Essential (primary) hypertension: Secondary | ICD-10-CM | POA: Diagnosis not present

## 2024-03-29 DIAGNOSIS — E119 Type 2 diabetes mellitus without complications: Secondary | ICD-10-CM | POA: Diagnosis not present

## 2024-03-31 ENCOUNTER — Encounter (HOSPITAL_COMMUNITY): Payer: Self-pay

## 2024-03-31 ENCOUNTER — Emergency Department (HOSPITAL_COMMUNITY)

## 2024-03-31 ENCOUNTER — Other Ambulatory Visit: Payer: Self-pay

## 2024-03-31 ENCOUNTER — Inpatient Hospital Stay (HOSPITAL_COMMUNITY)
Admission: EM | Admit: 2024-03-31 | Discharge: 2024-04-06 | DRG: 871 | Disposition: A | Discharge status: Hospice | Attending: Family Medicine | Admitting: Family Medicine

## 2024-03-31 DIAGNOSIS — J690 Pneumonitis due to inhalation of food and vomit: Secondary | ICD-10-CM | POA: Diagnosis not present

## 2024-03-31 DIAGNOSIS — I129 Hypertensive chronic kidney disease with stage 1 through stage 4 chronic kidney disease, or unspecified chronic kidney disease: Secondary | ICD-10-CM | POA: Diagnosis not present

## 2024-03-31 DIAGNOSIS — E871 Hypo-osmolality and hyponatremia: Secondary | ICD-10-CM | POA: Diagnosis not present

## 2024-03-31 DIAGNOSIS — E440 Moderate protein-calorie malnutrition: Secondary | ICD-10-CM | POA: Diagnosis present

## 2024-03-31 DIAGNOSIS — Z7901 Long term (current) use of anticoagulants: Secondary | ICD-10-CM | POA: Diagnosis not present

## 2024-03-31 DIAGNOSIS — A419 Sepsis, unspecified organism: Secondary | ICD-10-CM | POA: Diagnosis not present

## 2024-03-31 DIAGNOSIS — R739 Hyperglycemia, unspecified: Secondary | ICD-10-CM

## 2024-03-31 DIAGNOSIS — N179 Acute kidney failure, unspecified: Secondary | ICD-10-CM | POA: Diagnosis not present

## 2024-03-31 DIAGNOSIS — Z950 Presence of cardiac pacemaker: Secondary | ICD-10-CM

## 2024-03-31 DIAGNOSIS — E1122 Type 2 diabetes mellitus with diabetic chronic kidney disease: Secondary | ICD-10-CM | POA: Diagnosis not present

## 2024-03-31 DIAGNOSIS — Z789 Other specified health status: Secondary | ICD-10-CM | POA: Diagnosis not present

## 2024-03-31 DIAGNOSIS — Z7982 Long term (current) use of aspirin: Secondary | ICD-10-CM

## 2024-03-31 DIAGNOSIS — Z7189 Other specified counseling: Secondary | ICD-10-CM | POA: Diagnosis not present

## 2024-03-31 DIAGNOSIS — I480 Paroxysmal atrial fibrillation: Secondary | ICD-10-CM

## 2024-03-31 DIAGNOSIS — I69391 Dysphagia following cerebral infarction: Secondary | ICD-10-CM | POA: Diagnosis not present

## 2024-03-31 DIAGNOSIS — E1165 Type 2 diabetes mellitus with hyperglycemia: Secondary | ICD-10-CM | POA: Diagnosis not present

## 2024-03-31 DIAGNOSIS — E870 Hyperosmolality and hypernatremia: Secondary | ICD-10-CM | POA: Diagnosis not present

## 2024-03-31 DIAGNOSIS — Z66 Do not resuscitate: Secondary | ICD-10-CM | POA: Diagnosis not present

## 2024-03-31 DIAGNOSIS — G9341 Metabolic encephalopathy: Secondary | ICD-10-CM | POA: Diagnosis present

## 2024-03-31 DIAGNOSIS — N1831 Chronic kidney disease, stage 3a: Secondary | ICD-10-CM | POA: Diagnosis present

## 2024-03-31 DIAGNOSIS — Z79899 Other long term (current) drug therapy: Secondary | ICD-10-CM | POA: Diagnosis not present

## 2024-03-31 DIAGNOSIS — Z1152 Encounter for screening for COVID-19: Secondary | ICD-10-CM

## 2024-03-31 DIAGNOSIS — R1310 Dysphagia, unspecified: Secondary | ICD-10-CM | POA: Diagnosis not present

## 2024-03-31 DIAGNOSIS — N400 Enlarged prostate without lower urinary tract symptoms: Secondary | ICD-10-CM | POA: Diagnosis present

## 2024-03-31 DIAGNOSIS — Z515 Encounter for palliative care: Secondary | ICD-10-CM

## 2024-03-31 DIAGNOSIS — I495 Sick sinus syndrome: Secondary | ICD-10-CM | POA: Diagnosis present

## 2024-03-31 DIAGNOSIS — Z885 Allergy status to narcotic agent status: Secondary | ICD-10-CM

## 2024-03-31 DIAGNOSIS — Z888 Allergy status to other drugs, medicaments and biological substances status: Secondary | ICD-10-CM

## 2024-03-31 DIAGNOSIS — I69351 Hemiplegia and hemiparesis following cerebral infarction affecting right dominant side: Secondary | ICD-10-CM | POA: Diagnosis not present

## 2024-03-31 DIAGNOSIS — Z6826 Body mass index (BMI) 26.0-26.9, adult: Secondary | ICD-10-CM

## 2024-03-31 DIAGNOSIS — R0902 Hypoxemia: Secondary | ICD-10-CM | POA: Diagnosis not present

## 2024-03-31 DIAGNOSIS — R4182 Altered mental status, unspecified: Secondary | ICD-10-CM | POA: Diagnosis not present

## 2024-03-31 DIAGNOSIS — E039 Hypothyroidism, unspecified: Secondary | ICD-10-CM | POA: Diagnosis not present

## 2024-03-31 DIAGNOSIS — R627 Adult failure to thrive: Secondary | ICD-10-CM | POA: Diagnosis present

## 2024-03-31 DIAGNOSIS — Z79890 Hormone replacement therapy (postmenopausal): Secondary | ICD-10-CM

## 2024-03-31 DIAGNOSIS — J9601 Acute respiratory failure with hypoxia: Secondary | ICD-10-CM | POA: Diagnosis not present

## 2024-03-31 DIAGNOSIS — E876 Hypokalemia: Secondary | ICD-10-CM | POA: Diagnosis not present

## 2024-03-31 LAB — BASIC METABOLIC PANEL WITH GFR
Anion gap: 15 (ref 5–15)
Anion gap: 12 (ref 5–15)
BUN: 50 mg/dL — ABNORMAL HIGH (ref 8–23)
BUN: 52 mg/dL — ABNORMAL HIGH (ref 8–23)
CO2: 25 mmol/L (ref 22–32)
CO2: 22 mmol/L (ref 22–32)
Calcium: 9.2 mg/dL (ref 8.9–10.3)
Calcium: 8.5 mg/dL — ABNORMAL LOW (ref 8.9–10.3)
Chloride: 108 mmol/L (ref 98–111)
Chloride: 116 mmol/L — ABNORMAL HIGH (ref 98–111)
Creatinine, Ser: 2.02 mg/dL — ABNORMAL HIGH (ref 0.61–1.24)
Creatinine, Ser: 1.64 mg/dL — ABNORMAL HIGH (ref 0.61–1.24)
GFR, Estimated: 30 mL/min — ABNORMAL LOW (ref >60)
GFR, Estimated: 39 mL/min — ABNORMAL LOW (ref >60)
Glucose, Bld: 606 mg/dL (ref 70–99)
Glucose, Bld: 561 mg/dL (ref 70–99)
Potassium: 4.7 mmol/L (ref 3.5–5.1)
Potassium: 3.7 mmol/L (ref 3.5–5.1)
Sodium: 148 mmol/L — ABNORMAL HIGH (ref 135–145)
Sodium: 150 mmol/L — ABNORMAL HIGH (ref 135–145)

## 2024-03-31 LAB — I-STAT VENOUS BLOOD GAS, ED
Acid-Base Excess: 4 mmol/L — ABNORMAL HIGH (ref 0.0–2.0)
Bicarbonate: 29.6 mmol/L — ABNORMAL HIGH (ref 20.0–28.0)
Calcium, Ion: 1.11 mmol/L — ABNORMAL LOW (ref 1.15–1.40)
HCT: 37 % — ABNORMAL LOW (ref 39.0–52.0)
Hemoglobin: 12.6 g/dL — ABNORMAL LOW (ref 13.0–17.0)
O2 Saturation: 67 %
Potassium: 4.7 mmol/L (ref 3.5–5.1)
Sodium: 149 mmol/L — ABNORMAL HIGH (ref 135–145)
TCO2: 31 mmol/L (ref 22–32)
pCO2, Ven: 44.9 mmHg (ref 44–60)
pH, Ven: 7.427 (ref 7.25–7.43)
pO2, Ven: 34 mmHg (ref 32–45)

## 2024-03-31 LAB — CBC WITH DIFFERENTIAL/PLATELET
Abs Immature Granulocytes: 0.08 K/uL — ABNORMAL HIGH (ref 0.00–0.07)
Basophils Absolute: 0 K/uL (ref 0.0–0.1)
Basophils Relative: 0 %
Eosinophils Absolute: 0 K/uL (ref 0.0–0.5)
Eosinophils Relative: 0 %
HCT: 39.4 % (ref 39.0–52.0)
Hemoglobin: 12.4 g/dL — ABNORMAL LOW (ref 13.0–17.0)
Immature Granulocytes: 1 %
Lymphocytes Relative: 17 %
Lymphs Abs: 2.8 K/uL (ref 0.7–4.0)
MCH: 32.9 pg (ref 26.0–34.0)
MCHC: 31.5 g/dL (ref 30.0–36.0)
MCV: 104.5 fL — ABNORMAL HIGH (ref 80.0–100.0)
Monocytes Absolute: 1.4 K/uL — ABNORMAL HIGH (ref 0.1–1.0)
Monocytes Relative: 9 %
Neutro Abs: 12.1 K/uL — ABNORMAL HIGH (ref 1.7–7.7)
Neutrophils Relative %: 73 %
Platelets: 269 K/uL (ref 150–400)
RBC: 3.77 MIL/uL — ABNORMAL LOW (ref 4.22–5.81)
RDW: 12.4 % (ref 11.5–15.5)
WBC: 16.4 K/uL — ABNORMAL HIGH (ref 4.0–10.5)
nRBC: 0 % (ref 0.0–0.2)

## 2024-03-31 LAB — CBG MONITORING, ED
Glucose-Capillary: 541 mg/dL (ref 70–99)
Glucose-Capillary: 478 mg/dL — ABNORMAL HIGH (ref 70–99)

## 2024-03-31 LAB — RESP PANEL BY RT-PCR (RSV, FLU A&B, COVID)  RVPGX2
Influenza A by PCR: NEGATIVE
Influenza B by PCR: NEGATIVE
Resp Syncytial Virus by PCR: NEGATIVE
SARS Coronavirus 2 by RT PCR: NEGATIVE

## 2024-03-31 LAB — GLUCOSE, RANDOM: Glucose, Bld: 559 mg/dL (ref 70–99)

## 2024-03-31 LAB — URINALYSIS, ROUTINE W REFLEX MICROSCOPIC
Bacteria, UA: NONE SEEN
Bilirubin Urine: NEGATIVE
Glucose, UA: >=500 mg/dL — AB
Hgb urine dipstick: NEGATIVE
Ketones, ur: NEGATIVE mg/dL
Leukocytes,Ua: NEGATIVE
Nitrite: NEGATIVE
Protein, ur: NEGATIVE mg/dL
Specific Gravity, Urine: 1.028 (ref 1.005–1.030)
pH: 5 (ref 5.0–8.0)

## 2024-03-31 LAB — T4, FREE: Free T4: 1.52 ng/dL — ABNORMAL HIGH (ref 0.61–1.12)

## 2024-03-31 LAB — BETA-HYDROXYBUTYRIC ACID
Beta-Hydroxybutyric Acid: 0.52 mmol/L — ABNORMAL HIGH (ref 0.05–0.27)
Beta-Hydroxybutyric Acid: 0.16 mmol/L (ref 0.05–0.27)

## 2024-03-31 LAB — BRAIN NATRIURETIC PEPTIDE: B Natriuretic Peptide: 229.8 pg/mL — ABNORMAL HIGH (ref 0.0–100.0)

## 2024-03-31 LAB — GLUCOSE, CAPILLARY
Glucose-Capillary: 428 mg/dL — ABNORMAL HIGH (ref 70–99)
Glucose-Capillary: 494 mg/dL — ABNORMAL HIGH (ref 70–99)
Glucose-Capillary: 480 mg/dL — ABNORMAL HIGH (ref 70–99)

## 2024-03-31 LAB — I-STAT CG4 LACTIC ACID, ED
Lactic Acid, Venous: 2.4 mmol/L (ref 0.5–1.9)
Lactic Acid, Venous: 1.9 mmol/L (ref 0.5–1.9)

## 2024-03-31 MED ORDER — SODIUM CHLORIDE 0.9 % IV SOLN
500.0000 mg | Freq: Once | INTRAVENOUS | Status: AC
  Administered 2024-03-31: 500 mg via INTRAVENOUS
  Filled 2024-03-31: qty 5

## 2024-03-31 MED ORDER — APIXABAN 5 MG PO TABS: 5.0000 mg | ORAL_TABLET | Freq: Two times a day (BID) | ORAL | Status: DC

## 2024-03-31 MED ORDER — APIXABAN 2.5 MG PO TABS
2.5000 mg | ORAL_TABLET | Freq: Two times a day (BID) | ORAL | Status: DC
  Administered 2024-03-31 – 2024-04-06 (×12): 2.5 mg via ORAL
  Filled 2024-03-31 (×12): qty 1

## 2024-03-31 MED ORDER — SODIUM CHLORIDE 0.9 % IV BOLUS
1000.0000 mL | Freq: Once | INTRAVENOUS | Status: AC
  Administered 2024-03-31: 1000 mL via INTRAVENOUS

## 2024-03-31 MED ORDER — INSULIN ASPART 100 UNIT/ML IJ SOLN
20.0000 [IU] | Freq: Once | INTRAMUSCULAR | Status: AC
  Administered 2024-03-31: 20 [IU] via SUBCUTANEOUS

## 2024-03-31 MED ORDER — INSULIN ASPART 100 UNIT/ML IJ SOLN
0.0000 [IU] | INTRAMUSCULAR | Status: DC
  Administered 2024-04-01 (×2): 11 [IU] via SUBCUTANEOUS
  Administered 2024-04-01: 3 [IU] via SUBCUTANEOUS
  Administered 2024-04-01: 5 [IU] via SUBCUTANEOUS

## 2024-03-31 MED ORDER — TAMSULOSIN HCL 0.4 MG PO CAPS
0.8000 mg | ORAL_CAPSULE | Freq: Every day | ORAL | Status: DC
  Administered 2024-03-31 – 2024-04-02 (×2): 0.8 mg via ORAL
  Filled 2024-03-31 (×2): qty 2

## 2024-03-31 MED ORDER — ROSUVASTATIN CALCIUM 20 MG PO TABS
20.0000 mg | ORAL_TABLET | Freq: Every day | ORAL | Status: DC
  Administered 2024-03-31 – 2024-04-05 (×6): 20 mg via ORAL
  Filled 2024-03-31 (×6): qty 1

## 2024-03-31 MED ORDER — SODIUM CHLORIDE 0.9 % IV SOLN: INTRAVENOUS | Status: DC

## 2024-03-31 MED ORDER — INSULIN ASPART 100 UNIT/ML IJ SOLN
8.0000 [IU] | Freq: Once | INTRAMUSCULAR | Status: AC
  Administered 2024-03-31: 8 [IU] via SUBCUTANEOUS

## 2024-03-31 MED ORDER — CARVEDILOL 3.125 MG PO TABS: 3.1250 mg | ORAL_TABLET | Freq: Two times a day (BID) | ORAL | Status: DC | PRN

## 2024-03-31 MED ORDER — LEVOTHYROXINE SODIUM 112 MCG PO TABS
112.0000 ug | ORAL_TABLET | Freq: Every day | ORAL | Status: DC
  Administered 2024-04-01 – 2024-04-06 (×6): 112 ug via ORAL
  Filled 2024-03-31 (×6): qty 1

## 2024-03-31 MED ORDER — ASPIRIN 81 MG PO TBEC
81.0000 mg | DELAYED_RELEASE_TABLET | Freq: Every day | ORAL | Status: DC
  Administered 2024-03-31: 81 mg via ORAL
  Filled 2024-03-31 (×2): qty 1

## 2024-03-31 MED ORDER — SODIUM CHLORIDE 0.9 % IV SOLN
1.0000 g | Freq: Once | INTRAVENOUS | Status: AC
  Administered 2024-03-31: 1 g via INTRAVENOUS
  Filled 2024-03-31: qty 10

## 2024-03-31 MED ORDER — AMIODARONE HCL 100 MG PO TABS
50.0000 mg | ORAL_TABLET | Freq: Every day | ORAL | Status: DC
  Administered 2024-03-31 – 2024-04-06 (×7): 50 mg via ORAL
  Filled 2024-03-31 (×8): qty 1

## 2024-03-31 NOTE — Evaluation (Signed)
 Clinical/Bedside Swallow Evaluation Patient Details  Name: Alexandru Moorer MRN: [id number] Date of Birth: 1929/01/03  Today's Date: 03/31/2024 Time: SLP Start Time (ACUTE ONLY): 1620 SLP Stop Time (ACUTE ONLY): 1648 SLP Time Calculation (min) (ACUTE ONLY): 28 min  Past Medical History:  Past Medical History:  Diagnosis Date   Arthritis    Clotting disorder    Coronary artery disease    Diabetes ([name])    Hyperlipidemia    Hypertension    Hypothyroidism 07/17/2020   Kidney disease    Loss of hearing    Macular degeneration    Myocardial infarction [facility])    Osteoarthritis    Stroke ([name])    Venous thromboembolism (VTE) 07/17/2020   Past Surgical History:  Past Surgical History:  Procedure Laterality Date   CARPAL TUNNEL RELEASE  2011   CORONARY ANGIOPLASTY WITH STENT PLACEMENT     CORONARY ARTERY BYPASS GRAFT  2001   x6   REPLACEMENT TOTAL KNEE     SPINE SURGERY  2008   cervical and lumbar   THYROIDECTOMY     HPI:  Pt is a 89 y.o. presents today with AHRF iso aspiration event. History provided by wife and son at the bedside. Per their report, they were called early this morning around 0200 when the pt was evaluated by staff who was c/f aspiration given increased O2 requierment and difficulty breathing. CXR (10/4) revealed, No pleural effusion, acute consolidation of lung, or pneumothorax. No acute to suggest the patient's hypoxia. pt on puree/NTL at SNF, they also note possible instrumental assessment completed. PMH: type 2 diabetes, hypertension, hypothyroidism, paroxysmal A-fib on Eliquis , sick sinus syndrome status post pacemaker placement, and recent admission on  for acute CVA (left thalamic infarct and Small acute to subacute left frontal lobe infarcts).    Assessment / Plan / Recommendation  Clinical Impression  Pt presents with clinical signs of oropharyngeal dysphagia. Family, at bedside, report pt seen recently by SNF SLP who recommended a dys 1 diet/NTL. Per  family, an instrumental swallow assessment may have been completed while pt resided at [facility], but this is still in question. Oral motor exam WFL, pt with several missing dentition. He required assist for self-feeding, but actively and eagerly consumed several trials of thin, NTL, HTL and puree. Thin by cup/straw and NTL by straw resulted in consistent overt coughing. Controlled small sips of NTL by cup improved this presentation, although few occasions of coughing still present. HTL by cup was without signs of aspiration. Puree by tsp also WFL. Educated family regarding risks and benefits of thickened liquids and risk of continuing to consume thin liquids given pt's signs of aspiration. Family reports concerns for possible increased thirst/dry mouth as of recently and clinician discussed that dehydration is a risk with thickened liquid consumption. After discussion with family, recommend continue dys 1 diet with NTL (no straws, cup only) with completion of instrumental swallow assessment. Family agreed to completion of Modified Barium Swallow Study (MBSS), however they will gather info from SNF to see if an MBSS was actually done during stay at SNF. If s/sx of aspiration increase significantly with NTL despite swallow strategies/precautions, contact SLP team. Will f/u.  SLP Visit Diagnosis: Dysphagia, unspecified (R13.10)    Aspiration Risk  Moderate aspiration risk    Diet Recommendation Dysphagia 1 (Puree);Nectar-thick liquid    Liquid Administration via: Cup;No straw Medication Administration: Whole meds with puree (or crushed as tolerated) Supervision: Staff to assist with self feeding;Full supervision/cueing for compensatory strategies Compensations: Minimize environmental  distractions;Slow rate;Small sips/bites Postural Changes: Seated upright at 90 degrees    Other  Recommendations Oral Care Recommendations: Oral care BID     Assistance Recommended at Discharge    Functional Status Assessment     Frequency and Duration min 2x/week  2 weeks       Prognosis Prognosis for improved oropharyngeal function: Good Barriers to Reach Goals: Time post onset;Cognitive deficits      Swallow Study   General Date of Onset: 03/31/24 HPI: Pt is a 89 y.o. presents today with AHRF iso aspiration event. History provided by wife and son at the bedside. Per their report, they were called early this morning around 0200 when the pt was evaluated by staff who was c/f aspiration given increased O2 requierment and difficulty breathing. CXR (10/4) revealed, No pleural effusion, acute consolidation of lung, or pneumothorax. No acute to suggest the patient's hypoxia. pt on puree/NTL at SNF, they also note possible instrumental assessment completed. PMH: type 2 diabetes, hypertension, hypothyroidism, paroxysmal A-fib on Eliquis , sick sinus syndrome status post pacemaker placement, and recent admission on  for acute CVA (left thalamic infarct and Small acute to subacute left frontal lobe infarcts). Type of Study: Bedside Swallow Evaluation Previous Swallow Assessment: seen by SLP in SNF per family Diet Prior to this Study: Dysphagia 1 (pureed);Thin liquids (Level 0) Temperature Spikes Noted: Yes Respiratory Status: Room air History of Recent Intubation: No Behavior/Cognition: Alert;Cooperative;Pleasant mood;Confused;Requires cueing Oral Cavity Assessment: Within Functional Limits Oral Care Completed by SLP: No Oral Cavity - Dentition: Missing dentition Vision:  (difficult to assess) Self-Feeding Abilities: Total assist Patient Positioning: Upright in bed;Postural control adequate for testing Baseline Vocal Quality: Normal Volitional Cough: Weak Volitional Swallow: Able to elicit    Oral/Motor/Sensory Function Overall Oral Motor/Sensory Function: Within functional limits   Ice Chips Ice chips: Not tested   Thin Liquid Thin Liquid: Impaired Presentation: Cup;Straw Pharyngeal  Phase Impairments: Cough -  Immediate    Nectar Thick Nectar Thick Liquid: Impaired Presentation: Cup;Straw Pharyngeal Phase Impairments: Cough - Immediate   Honey Thick Honey Thick Liquid: Within functional limits   Puree Puree: Within functional limits Presentation: Spoon   Solid     Solid: Not tested       Wilder Kin, MA, CCC-SLP Acute Rehabilitation Services Office Number: [phone]  Wilder KANDICE Kin 03/31/2024,5:06 PM

## 2024-03-31 NOTE — ED Provider Notes (Signed)
 [place] EMERGENCY DEPARTMENT AT [facility] Provider Note   CSN: [id number] Arrival date & time: 03/31/24  0707     Patient presents with: Code Sepsis   Thomas [name] is a 89 y.o. male with history of type 2 diabetes, on insulin , hypertension, hypothyroidism, paroxysmal A-fib on Eliquis , sick sinus syndrome status post pacemaker, presented to the ED via EMS from nursing facility with concern for hypoxia.  Patient reportedly is not normally on oxygen but is now requiring 3 to 4 L.  There was concern for possible aspiration event at this nursing facility.  He has been discharged from the hospital in September 29, per my review of external records.  At that time he admitted for altered mental status and generalized weakness with urinary retention.  He was noted on CT imaging to have a 4.2 cm ascending thoracic aortic aneurysm.  MRI of the brain while in the hospital noted to centimeter acute left thymic infarct and small acute to subacute left frontal lobe infarcts.  EMS give the patient 500 cc of fluid and route to the hospital.  Blood sugar was noted to be over 500 on arrival.  Per my review of patient's records from his nursing facility, he is on Eliquis  twice a day, amiodarone  for his A-fib, he is also on Synthroid , as well as midodrine  for hypotension.  Of note, the patient is DNR, neurology will sign DNR form.   HPI     Prior to Admission medications   Medication Sig Start Date End Date Taking? Authorizing Provider  acetaminophen  (TYLENOL ) 500 MG tablet Take 650 mg by mouth in the morning and at bedtime. Take one tablet (500mg ) by mouth twice per day in the morning and evening.    [provider]  amiodarone  (PACERONE ) 100 MG tablet Take 1 tablet (100 mg total) by mouth daily. Take 1/2 tablet (100mg ) by mouth once daily 03/25/24   Laurence Locus, DO  apixaban  (ELIQUIS ) 5 MG TABS tablet Take 5 mg by mouth 2 (two) times daily.    [provider]  aspirin  EC  81 MG tablet Take 1 tablet (81 mg total) by mouth daily. Swallow whole. 03/26/24   Laurence Locus, DO  B Complex-C (SUPER B COMPLEX PO) Take 1 tablet by mouth in the morning.    [provider]  carvedilol  (COREG ) 3.125 MG tablet Take 1 tablet (3.125 mg total) by mouth 2 (two) times daily as needed (hold if SBP <140). 03/25/24   Laurence Locus, DO  cholecalciferol  (VITAMIN D3) 25 MCG (1000 UNIT) tablet Take 1,000 Units by mouth daily.    [provider]  co-enzyme Q-10 50 MG capsule Take 50 mg by mouth daily.    [provider]  furosemide  (LASIX ) 20 MG tablet Take 1 tablet (20 mg total) by mouth daily. 11/18/23   Kennyth Worth HERO, MD  glucosamine-chondroitin 500-400 MG tablet Take 1 tablet by mouth in the morning and at bedtime.    [provider]  insulin  glargine (LANTUS ) 100 UNIT/ML injection Inject 0.05 mLs (5 Units total) into the skin daily after breakfast. 03/25/24   Laurence Locus, DO  levothyroxine  (SYNTHROID ) 112 MCG tablet TAKE 1 TABLET(112 MCG) BY MOUTH DAILY BEFORE BREAKFAST 03/19/24   Kennyth Worth HERO, MD  midodrine  (PROAMATINE ) 2.5 MG tablet Take 1 tablet (2.5 mg total) by mouth 3 (three) times daily as needed (SBP <90). 03/25/24   Laurence Locus, DO  Multiple Vitamin (MULTIVITAMIN) tablet Take 1 tablet by mouth daily.  [provider]  Red Yeast Rice 600 MG CAPS Take 600 mg by mouth in the morning and at bedtime.    [provider]  rosuvastatin  (CRESTOR ) 20 MG tablet Take 1 tablet (20 mg total) by mouth at bedtime. 09/26/23   Nahser, Aleene PARAS, MD  tamsulosin  (FLOMAX ) 0.4 MG CAPS capsule Take 2 capsules (0.8 mg total) by mouth daily after supper. 08/04/23   Kennyth Worth HERO, MD    Allergies: Amitriptyline, Lyrica [pregabalin], Pravachol [pravastatin], Effexor xr [venlafaxine], Morphine, and Zolpidem    Review of Systems  Updated Vital Signs BP (!) 156/63   Pulse 70   Temp 98.5 F (36.9 C) (Axillary)   Resp 20   SpO2 100%   Physical  Exam Constitutional:      General: He is not in acute distress. HENT:     Head: Normocephalic and atraumatic.  Eyes:     Conjunctiva/sclera: Conjunctivae normal.     Pupils: Pupils are equal, round, and reactive to light.  Cardiovascular:     Rate and Rhythm: Normal rate. Rhythm irregular.  Pulmonary:     Effort: Pulmonary effort is normal. No respiratory distress.  Abdominal:     General: There is no distension.     Tenderness: There is no abdominal tenderness.  Skin:    General: Skin is warm and dry.  Neurological:     Mental Status: He is alert. Mental status is at baseline.     Comments: Chronic expressive aphasia     (all labs ordered are listed, but only abnormal results are displayed) Labs Reviewed  BASIC METABOLIC PANEL WITH GFR - Abnormal; Notable for the following components:      Result Value   Sodium 148 (*)    Glucose, Bld 606 (*)    BUN 50 (*)    Creatinine, Ser 2.02 (*)    GFR, Estimated 30 (*)    All other components within normal limits  CBC WITH DIFFERENTIAL/PLATELET - Abnormal; Notable for the following components:   WBC 16.4 (*)    RBC 3.77 (*)    Hemoglobin 12.4 (*)    MCV 104.5 (*)    Neutro Abs 12.1 (*)    Monocytes Absolute 1.4 (*)    Abs Immature Granulocytes 0.08 (*)    All other components within normal limits  BRAIN NATRIURETIC PEPTIDE - Abnormal; Notable for the following components:   B Natriuretic Peptide 229.8 (*)    All other components within normal limits  BETA-HYDROXYBUTYRIC ACID - Abnormal; Notable for the following components:   Beta-Hydroxybutyric Acid 0.52 (*)    All other components within normal limits  T4, FREE - Abnormal; Notable for the following components:   Free T4 1.52 (*)    All other components within normal limits  CBG MONITORING, ED - Abnormal; Notable for the following components:   Glucose-Capillary 541 (*)    All other components within normal limits  I-STAT VENOUS BLOOD GAS, ED - Abnormal; Notable for the  following components:   Bicarbonate 29.6 (*)    Acid-Base Excess 4.0 (*)    Sodium 149 (*)    Calcium , Ion 1.11 (*)    HCT 37.0 (*)    Hemoglobin 12.6 (*)    All other components within normal limits  I-STAT CG4 LACTIC ACID, ED - Abnormal; Notable for the following components:   Lactic Acid, Venous 2.4 (*)    All other components within normal limits  CBG MONITORING, ED - Abnormal; Notable for the following components:  Glucose-Capillary 478 (*)    All other components within normal limits  RESP PANEL BY RT-PCR (RSV, FLU A&B, COVID)  RVPGX2  CULTURE, BLOOD (ROUTINE X 2)  CULTURE, BLOOD (ROUTINE X 2)  URINALYSIS, ROUTINE W REFLEX MICROSCOPIC  TSH  I-STAT CG4 LACTIC ACID, ED    EKG: None  Radiology: DG Chest Portable 1 View Result Date: 03/31/2024 CLINICAL DATA:  Aspiration/hypoxia. EXAM: PORTABLE CHEST 1 VIEW COMPARISON:  None Available. FINDINGS: Patient rotated to the right. Linear atelectasis again seen right lung base. Elevated right diaphragm. No pleural effusion. No pneumothorax. No displaced fractures. Old left rib fractures again seen. No consolidation or evidence of significant aspiration. Two lead pacer and stable appearing mediastinal silhouette. IMPRESSION: No pleural effusion, acute consolidation of lung, or pneumothorax. No acute to suggest the patient's hypoxia. Electronically Signed   By: Cordella Banner   On: 03/31/2024 10:09     .Critical Care  Performed by: Cottie Donnice PARAS, MD Authorized by: Cottie Donnice PARAS, MD   Critical care provider statement:    Critical care time (minutes):  30   Critical care time was exclusive of:  Separately billable procedures and treating other patients   Critical care was necessary to treat or prevent imminent or life-threatening deterioration of the following conditions:  Sepsis   Critical care was time spent personally by me on the following activities:  Ordering and performing treatments and interventions, ordering and  review of laboratory studies, ordering and review of radiographic studies, pulse oximetry, review of old charts, examination of patient and evaluation of patient's response to treatment    Medications Ordered in the ED  azithromycin (ZITHROMAX) 500 mg in sodium chloride  0.9 % 250 mL IVPB ( Intravenous Restarted 03/31/24 1122)  0.9 %  sodium chloride  infusion ( Intravenous New Bag/Given 03/31/24 1052)  sodium chloride  0.9 % bolus 1,000 mL (0 mLs Intravenous Stopped 03/31/24 1036)  insulin  aspart (novoLOG ) injection 8 Units (8 Units Subcutaneous Given 03/31/24 1047)  cefTRIAXone (ROCEPHIN) 1 g in sodium chloride  0.9 % 100 mL IVPB (0 g Intravenous Stopped 03/31/24 1122)    Clinical Course as of 03/31/24 1135  Sat Mar 31, 2024  0751 Rectal temp febrile here [MT]  1013 Requesting catheterized urine from RN [MT]  1033 Will initiate antibiotics for suspected aspiration PNA given new hypoxia -  1 liter fluids and then NS infusion ordered given known hx of CHF to avoid aggressive volume overload [MT]  1052 Repeat lactate improved, BS improving [MT]  1132 Wife and son at bedside updated regarding admission [MT]  1134 BP(!): 156/63 [MT]  1134 Admitted to Dr Georgina hospitalist [MT]    Clinical Course User Index [MT] Cottie Donnice PARAS, MD                                 Medical Decision Making Amount and/or Complexity of Data Reviewed Labs: ordered. Radiology: ordered. ECG/medicine tests: ordered.  Risk Prescription drug management. Decision regarding hospitalization.   This patient presents to the ED with concern for confusion, weakness, hypoxia. This involves an extensive number of treatment options, and is a complaint that carries with it a high risk of complications and morbidity.  The differential diagnosis includes metabolic derangement versus infection including viral infection including COVID-19 infection versus aspiration pneumonia versus urinary tract infection versus metabolic  derangement versus other  Co-morbidities that complicate the patient evaluation: Advanced age, history of diabetes, at high risk of  metabolic derangement and infection  Additional history obtained from EMS  External records from outside source obtained and reviewed including hospital discharge records from last week  I ordered and personally interpreted labs.  The pertinent results include: Elevated lactate improved with fluids.  Elevated blood sugar 606, improved to fluids and insulin  in the 400s.  Normal anion gap.  White blood cell count elevated 16.4.  COVID and flu testing are negative  I ordered imaging studies including x-ray of the chest I independently visualized and interpreted imaging which showed no obvious consolidation, limited view on single x-ray I agree with the radiologist interpretation  The patient was maintained on a cardiac monitor.  I personally viewed and interpreted the cardiac monitored which showed an underlying rhythm of: Regular heart rate  I ordered medication including IV fluids, IV antibiotics for suspected aspiration pneumonia, per sepsis protocol, insulin  for hyperglycemia  I have reviewed the patients home medicines and have made adjustments as needed  Test Considered: No emergent indication for neuroimaging or CT imaging of the abdomen.  Doubt stroke or intra-abdominal infection or surgical emergency  After the interventions noted above, I reevaluated the patient and found that they have: improved -patient's lactate and blood sugar improving   Disposition:  After consideration of the diagnostic results and the patients response to treatment, I feel that the patent would benefit from medical admission.  I am still treating empirically for likely aspiration pneumonia given hypoxia and fever.  UA is pending at the time of admission.  Patient's family was updated at the bedside.  They confirm the patient has had waxing and waning confusion ever since his  admission for stroke in the hospital.  Prior to that, he was reportedly quite lucid.  I think this is likely a sequelae of his strokes.      Final diagnoses:  Sepsis, due to unspecified organism, unspecified whether acute organ dysfunction present [facility])  Hyperglycemia  Hypoxia    ED Discharge Orders     None          Cottie Donnice PARAS, MD 03/31/24 1137

## 2024-03-31 NOTE — Plan of Care (Signed)

## 2024-03-31 NOTE — ED Triage Notes (Addendum)
 Staff called out for O2 being 87% on room air and increased respirations. Nurse from facility states concern for aspiration pneumonia. Hx of stroke and has right sided deficits from stroke. Axox2 at baseline. BP 100/60. Hx of DM type 2. EMS have 500cc of LR

## 2024-03-31 NOTE — H&P (Addendum)
 History and Physical    Patient: Thomas [name] FMW:[id number] DOB: Jun 10, 1929 DOA: 03/31/2024 DOS: the patient was seen and examined on 03/31/2024 PCP: Kennyth Worth HERO, MD  Patient coming from: Home  Chief Complaint:  Chief Complaint  Patient presents with   Code Sepsis   HPI: Thomas [name] is a 89 y.o. male with medical history significant of type 2 diabetes, hypertension, hypothyroidism, paroxysmal A-fib on Eliquis , sick sinus syndrome status post pacemaker placement, and recent admission on  for acute CVA (left thalamic infarct and Small acute to subacute left frontal lobe infarcts)for which pt was started on ASA in addition to pta Eliquis  and discharged to rehab re-presents today with AHRF iso aspiration event.  Pt resting comfortably and unable to communicate. History provided by wife and son at the bedside. Per their report, they were called early this morning around 0200 when the pt was evaluated by staff who was c/f aspiration given increased O2 requierment and difficulty breathing; as such, EMS was activated and pt BIBA to ED. Of note, since CVA on 9/23, pt has been bed bound with intermittent lethargy and able to tol PO infrequently.  In the ED, pt febrile 100.9, hypertensive, and tachypneic W/O hypoxia. Labs notable for na 148, glucose 606, BNP 229, lactic acid 2.4-->1.9, and WBC 16.4. EDP administered CAP abx (IV CTX/azithromycin) and requested medicine admission.   Review of Systems: As mentioned in the history of present illness. All other systems reviewed and are negative. Past Medical History:  Diagnosis Date   Arthritis    Clotting disorder    Coronary artery disease    Diabetes ([name])    Hyperlipidemia    Hypertension    Hypothyroidism 07/17/2020   Kidney disease    Loss of hearing    Macular degeneration    Myocardial infarction [facility])    Osteoarthritis    Stroke [facility])    Venous thromboembolism (VTE) 07/17/2020   Past Surgical History:  Procedure Laterality  Date   CARPAL TUNNEL RELEASE  2011   CORONARY ANGIOPLASTY WITH STENT PLACEMENT     CORONARY ARTERY BYPASS GRAFT  2001   x6   REPLACEMENT TOTAL KNEE     SPINE SURGERY  2008   cervical and lumbar   THYROIDECTOMY     Social History:  reports that he has quit smoking. His smoking use included pipe. He has never used smokeless tobacco. He reports that he does not drink alcohol and does not use drugs.  Allergies  Allergen Reactions   Elavil [Amitriptyline] Shortness Of Breath and Palpitations   Lyrica [Pregabalin] Nausea And Vomiting    Not documented with SNF   Pravachol [Pravastatin] Other (See Comments)    Myalgias    Ambien [Zolpidem] Anxiety and Palpitations   Effexor Xr [Venlafaxine] Anxiety   Ms Contin [Morphine] Palpitations    Family History  Problem Relation Age of Onset   Stroke Mother    Stroke Father    Heart disease Sister    Cancer Daughter    Hypertension Son    Heart disease Brother    Cancer Brother    Heart disease Brother     Prior to Admission medications   Medication Sig Start Date End Date Taking? Authorizing Provider  acetaminophen  (TYLENOL ) 500 MG tablet Take 650 mg by mouth in the morning and at bedtime.   Yes [provider]  amiodarone  (PACERONE ) 100 MG tablet Take 1 tablet (100 mg total) by mouth daily. Take 1/2 tablet (100mg ) by mouth once daily  Patient taking differently: Take 50 mg by mouth daily. 03/25/24  Yes Laurence Locus, DO  apixaban  (ELIQUIS ) 5 MG TABS tablet Take 5 mg by mouth 2 (two) times daily.   Yes [provider]  aspirin  EC 81 MG tablet Take 1 tablet (81 mg total) by mouth daily. Swallow whole. 03/26/24  Yes Laurence Locus, DO  B Complex-Biotin-FA (SUPER B-50 B COMPLEX) CAPS Take 1 capsule by mouth daily. Super B-50 complex - (vit B complex-folic-choline-inositol 468mcg-20mg -50mg )   Yes [provider]  B Complex-C (SUPER B COMPLEX PO) Take 1 tablet by mouth in the morning.   Yes [provider]   carvedilol  (COREG ) 3.125 MG tablet Take 1 tablet (3.125 mg total) by mouth 2 (two) times daily as needed (hold if SBP <140). 03/25/24  Yes Laurence Locus, DO  cholecalciferol  (VITAMIN D3) 25 MCG (1000 UNIT) tablet Take 1,000 Units by mouth daily.   Yes [provider]  co-enzyme Q-10 50 MG capsule Take 50 mg by mouth daily.   Yes [provider]  furosemide  (LASIX ) 20 MG tablet Take 1 tablet (20 mg total) by mouth daily. 11/18/23  Yes Kennyth Worth HERO, MD  glucosamine-chondroitin 500-400 MG tablet Take 1 tablet by mouth in the morning and at bedtime.   Yes [provider]  insulin  glargine (LANTUS ) 100 UNIT/ML injection Inject 0.05 mLs (5 Units total) into the skin daily after breakfast. 03/25/24  Yes Laurence Locus, DO  ipratropium-albuterol (DUONEB) 0.5-2.5 (3) MG/3ML SOLN Take 3 mLs by nebulization every 6 (six) hours as needed (wheezing, shortness of breath).   Yes [provider]  levothyroxine  (SYNTHROID ) 112 MCG tablet TAKE 1 TABLET(112 MCG) BY MOUTH DAILY BEFORE BREAKFAST 03/19/24  Yes Kennyth Worth HERO, MD  midodrine  (PROAMATINE ) 2.5 MG tablet Take 1 tablet (2.5 mg total) by mouth 3 (three) times daily as needed (SBP <90). 03/25/24  Yes Laurence Locus, DO  Multiple Vitamin (MULTIVITAMIN) tablet Take 1 tablet by mouth daily.   Yes [provider]  Red Yeast Rice 600 MG CAPS Take 600 mg by mouth in the morning and at bedtime.   Yes [provider]  rosuvastatin  (CRESTOR ) 20 MG tablet Take 1 tablet (20 mg total) by mouth at bedtime. 09/26/23  Yes Nahser, Aleene PARAS, MD  tamsulosin  (FLOMAX ) 0.4 MG CAPS capsule Take 2 capsules (0.8 mg total) by mouth daily after supper. 08/04/23  Yes Kennyth Worth HERO, MD    Physical Exam: Vitals:   03/31/24 1034 03/31/24 1300 03/31/24 1345 03/31/24 1408  BP:  (!) 182/66 (!) 160/66 (!) 162/71  Pulse:  70 69 70  Resp:  20 20 16   Temp: 98.5 F (36.9 C)  98.7 F (37.1 C) 98.3 F (36.8 C)  TempSrc: Axillary   Oral  SpO2:  96%  95% 97%   General: Alert, oriented x3, resting comfortably in no acute distress Respiratory: Clear to auscultation anteriorly; no wheezing Cardiovascular: Regular rate and rhythm w/o m/r/g Abdomen: Soft, nontender, nondistended. Positive bowel sounds   Data Reviewed:  Lab Results  Component Value Date   WBC 16.4 (H) 03/31/2024   HGB 12.6 (L) 03/31/2024   HCT 37.0 (L) 03/31/2024   MCV 104.5 (H) 03/31/2024   PLT 269 03/31/2024   Lab Results  Component Value Date   GLUCOSE 606 (HH) 03/31/2024   CALCIUM  9.2 03/31/2024   NA 149 (H) 03/31/2024   K 4.7 03/31/2024   CO2 25 03/31/2024   CL 108 03/31/2024   BUN 50 (H) 03/31/2024  CREATININE 2.02 (H) 03/31/2024   Lab Results  Component Value Date   ALT 13 03/20/2024   AST 19 03/20/2024   ALKPHOS 47 03/20/2024   BILITOT 0.3 03/20/2024   Lab Results  Component Value Date   INR 2.3 12/12/2020   INR 5.3 (A) 12/10/2020   INR 3.1 (H) 10/11/2020   Radiology: DG Chest Portable 1 View Result Date: 03/31/2024 CLINICAL DATA:  Aspiration/hypoxia. EXAM: PORTABLE CHEST 1 VIEW COMPARISON:  None Available. FINDINGS: Patient rotated to the right. Linear atelectasis again seen right lung base. Elevated right diaphragm. No pleural effusion. No pneumothorax. No displaced fractures. Old left rib fractures again seen. No consolidation or evidence of significant aspiration. Two lead pacer and stable appearing mediastinal silhouette. IMPRESSION: No pleural effusion, acute consolidation of lung, or pneumothorax. No acute to suggest the patient's hypoxia. Electronically Signed   By: Cordella Banner   On: 03/31/2024 10:09    Assessment and Plan: 86M h/o type 2 diabetes, hypertension, hypothyroidism, paroxysmal A-fib on Eliquis , sick sinus syndrome status post pacemaker placement, and recent admission on  for acute CVA (left thalamic infarct and Small acute to subacute left frontal lobe infarcts)for which pt was started on ASA in addition to pta Eliquis   and discharged to rehab re-presents today with AHRF iso aspiration event.  AHRF iso presumed aspiration event S/p IV abx in ED; high suspicion for aspiration given decondition from prior admission and recumbent positioning -SLP consulted; apprec eval/recs -MIVF: NS at 75cc/hr for now -HOLD abx for now -Puree diet (family reports this is what pt tolerated after SLP eval at SNF) -Duonebs prn -Wean O2 as tolerated -Ambulatory pulse ox prior to d/c  AKI Cr 2.02 on admission up from 1.33 on 9/27 -MIVF: NS at 75cc/h for 24h -Strict I&Os and daily weights (standing preferred) -F/u BMP daily -Renally dose medications for CrCl -Avoid lovenox, NSAIDs, morphine, Fleet's phosphate enema, regular insulin , contrast; no gadolinium for MRI to avoid nephrogenic systemic fibrosis -Consider renal US  and nephrology consult if worsening AKI  Hyperglycemia Unclear dosing of insulin  pta -SSI q4h prn for now; will transition to TID [facility] once pt more alert and eating consistently  FTT High suspicion for limited PO intake and lack of nutrition is contributing to pt sleeping all day as well as lack of progress at SNF/rehab -RD consulted for calorie count; consider NGT and TF prn  PAF -PTA amiodarone  50mg  daily, Coreg  3.125mg  BID and apixaban  5mg  BID  Hypothyroid -PTA Synthroid  daily  BPH -PTA Flomax     Advance Care Planning:   Code Status: Limited: Do not attempt resuscitation (DNR) -DNR-LIMITED -Do Not Intubate/DNI    Consults: N/A  Family Communication: Wife and son  Severity of Illness: The appropriate patient status for this patient is INPATIENT. Inpatient status is judged to be reasonable and necessary in order to provide the required intensity of service to ensure the patient's safety. The patient's presenting symptoms, physical exam findings, and initial radiographic and laboratory data in the context of their chronic comorbidities is felt to place them at high risk for further  clinical deterioration. Furthermore, it is not anticipated that the patient will be medically stable for discharge from the hospital within 2 midnights of admission.   * I certify that at the point of admission it is my clinical judgment that the patient will require inpatient hospital care spanning beyond 2 midnights from the point of admission due to high intensity of service, high risk for further deterioration and high frequency of  surveillance required.*   ------- I spent 57 minutes reviewing previous notes, at the bedside counseling/discussing the treatment plan, and performing clinical documentation.  Author: Marsha Ada, MD 03/31/2024 2:35 PM  For on call review [email or web address].

## 2024-04-01 DIAGNOSIS — J690 Pneumonitis due to inhalation of food and vomit: Secondary | ICD-10-CM | POA: Diagnosis not present

## 2024-04-01 LAB — GLUCOSE, CAPILLARY
Glucose-Capillary: 190 mg/dL — ABNORMAL HIGH (ref 70–99)
Glucose-Capillary: 234 mg/dL — ABNORMAL HIGH (ref 70–99)
Glucose-Capillary: 348 mg/dL — ABNORMAL HIGH (ref 70–99)
Glucose-Capillary: 348 mg/dL — ABNORMAL HIGH (ref 70–99)
Glucose-Capillary: 410 mg/dL — ABNORMAL HIGH (ref 70–99)
Glucose-Capillary: 415 mg/dL — ABNORMAL HIGH (ref 70–99)
Glucose-Capillary: 430 mg/dL — ABNORMAL HIGH (ref 70–99)
Glucose-Capillary: 443 mg/dL — ABNORMAL HIGH (ref 70–99)

## 2024-04-01 LAB — TSH: TSH: 1.62 u[IU]/mL (ref 0.350–4.500)

## 2024-04-01 MED ORDER — INSULIN GLARGINE 100 UNIT/ML ~~LOC~~ SOLN
13.0000 [IU] | Freq: Every day | SUBCUTANEOUS | Status: DC
Start: 2024-04-01 — End: 2024-04-06
  Administered 2024-04-01 – 2024-04-06 (×6): 13 [IU] via SUBCUTANEOUS
  Filled 2024-04-01 (×6): qty 0.13

## 2024-04-01 MED ORDER — INSULIN ASPART 100 UNIT/ML IJ SOLN
0.0000 [IU] | Freq: Three times a day (TID) | INTRAMUSCULAR | Status: DC
  Administered 2024-04-01: 20 [IU] via SUBCUTANEOUS
  Administered 2024-04-02: 11 [IU] via SUBCUTANEOUS

## 2024-04-01 MED ORDER — INSULIN ASPART 100 UNIT/ML IJ SOLN
6.0000 [IU] | Freq: Once | INTRAMUSCULAR | Status: AC
  Administered 2024-04-01: 6 [IU] via SUBCUTANEOUS

## 2024-04-01 MED ORDER — INSULIN ASPART 100 UNIT/ML IJ SOLN: 0.0000 [IU] | Freq: Every day | INTRAMUSCULAR | Status: DC

## 2024-04-01 MED ORDER — SODIUM CHLORIDE 0.9 % IV SOLN
2.0000 g | INTRAVENOUS | Status: AC
  Administered 2024-04-01 – 2024-04-06 (×6): 2 g via INTRAVENOUS
  Filled 2024-04-01 (×6): qty 20

## 2024-04-01 MED ORDER — ASPIRIN 81 MG PO CHEW
81.0000 mg | CHEWABLE_TABLET | Freq: Every day | ORAL | Status: DC
Start: 2024-04-01 — End: 2024-04-06
  Administered 2024-04-01 – 2024-04-06 (×6): 81 mg via ORAL
  Filled 2024-04-01 (×6): qty 1

## 2024-04-01 MED ORDER — SODIUM CHLORIDE 0.45 % IV SOLN
INTRAVENOUS | Status: AC
Start: 2024-04-01 — End: 2024-04-02

## 2024-04-01 MED ORDER — ADULT MULTIVITAMIN W/MINERALS CH
1.0000 | ORAL_TABLET | Freq: Every day | ORAL | Status: DC
  Administered 2024-04-01 – 2024-04-06 (×6): 1 via ORAL
  Filled 2024-04-01 (×6): qty 1

## 2024-04-01 NOTE — Progress Notes (Signed)
  Progress Note   Patient: Thomas [name] FMW:[id number] DOB: 1929-01-22 DOA: 03/31/2024     1 DOS: the patient was seen and examined on 04/01/2024 at 10:28AM      Brief hospital course: 89 y.o. M with pAF and SSS s/p PPM, on ELiquis , recent stroke, DM, HTN, and hypothyroidism admitted from rehab with hypoxia and decreased responsiveness after an aspiration event.        Assessment and Plan: Sepsis due to aspiration pneumonia Acute respiratory failure with hypoxia Presented with fever, leukocytosis, respiratory failure and decreased mentation in setting of aspiration after recent stroke. - Continue Rocephin  - Consult respiratory therapy for chest PT - Consult SLP   Acute metabolic encephalopathy At baseline prior to stroke, the patient had no significant memory loss.  Since his stroke, he has been more disoriented, but has been alert and responsive to questions.  On arrival he was poorly responsive due to sepsis.  This is improving.  He still has some psychomotor slowing - Delirium precautions  Hypernatremia Sodium up to 150 today - Start hypotonic fluids  AKI on CKD IIIa Baseline Cr 1.1-1.4.  Here up to 2 in setting of sepsis. Creatinine improved to 1.64 - Continue IV fluids - Hold nephrotoxins - Avoid hypotension -Hold Lasix  for today  Recent stroke Cerebrovascular disease Hypertension Admitted recently for left thalamic infarct and small left frontal lobe infarct, likely due to commendation large and small vessel disease.  Discharged on new aspirin  combined with Eliquis  - Continue aspirin  and Eliquis  - Continue Crestor  - Hold Lasix   Diabetes with hyperglycemia Glucoses >500 on admission, improved with aspart overnight but still very high - Resume glargine - Continue SS corrections, increase scale  Hypothyroidism -Continue levothyroxine   Paroxysmal atrial fibrillation Sick sinus syndrome Pacemaker in place -Continue amiodarone , Eliquis  - Continue  Coreg   History of orthostasis Has midodrine  at home - Hold midodrine  for now      Subjective: The patient is gradually improving.  His alertness seems to still be reduced, he is having no respiratory distress.  He is weaned down on oxygen.  No further fever.     Physical Exam: BP 137/61 (BP Location: Right Arm)   Pulse 68   Temp 97.6 F (36.4 C) (Oral)   Resp 18   SpO2 97%   Frail elderly adult male, lying in bed, makes eye contact, hard of hearing, attempt to answer questions RRR, no murmurs, no peripheral edema Respiratory normal, lung sounds diminished overall but no rales or wheezes appreciated Abdomen soft, no tenderness palpation or guarding, no ascites or distention Psychomotor slowing is moderate, face is symmetric, he has some mild right sided weakness relative to the left although his overall debility is impressive and makes it difficult to tell the difference, oriented to self, hospital, but cannot name his family.    Data Reviewed: Basic metabolic panel shows worsening hyponatremia, slightly improved renal function, hemoglobin slightly low    Family Communication: Wife and son at the bedside    Disposition: Status is: Inpatient         Author: Lonni SHAUNNA Dalton, MD 04/01/2024 12:36 PM  For on call review [email or web address].

## 2024-04-01 NOTE — Plan of Care (Signed)

## 2024-04-01 NOTE — Progress Notes (Signed)
 Initial Nutrition Assessment  DOCUMENTATION CODES:  Not applicable  INTERVENTION:  Multivitamin w/ minerals daily Mighty Shake BID with meals, each supplement provides 330 kcals and 9 grams of protein Encourage good PO intake Calorie Count   NUTRITION DIAGNOSIS:  Swallowing difficulty related to dysphagia as evidenced by  (need for dysphagia diet with thickened liquids).  GOAL:  Patient will meet greater than or equal to 90% of their needs  MONITOR:  PO intake, Supplement acceptance, Weight trends, Labs  REASON FOR ASSESSMENT:  Consult Enteral/tube feeding initiation and management, Calorie Count  ASSESSMENT:  89 y.o. male presented to the ED with acute respiratory failure likely due to aspiration. PMH includes T2DM, CAD, HLD, HTN, prior CVA, BPH, sick sinus syndrome, s/p pacemaker. Pt admitted with acute respiratory failure likely due to aspiration event, AKI, and FTT.   10/4 - Admitted; diet advanced to Dysphagia 1/Nectar Thick Liquids  RD working remotely at time of assessment. Per chart, pt is only oriented to self. Pt with SLP evaluation and advanced diet appropriate to pt current condition. RD reached out to MD about consult, pt currently with no feeding tube in place, will start calorie count to assess need for feeding tube. Communicated starting calorie count with RN and requested an envelope is hung on the door.   Per chart review, pt with no new weight this admission. Over the past year, pt with a 3.1 kg weight loss, overall weight relatively stable.   Meal Intake 10/4: 100% dinner  Nutrition Related Medications: Novolog  0-15 units q4h Labs: Sodium 150, Potassium 3.7, BUN 52, Creatinine 1.64  CBG: 243-541 mg/dL x 24 hrs    NUTRITION - FOCUSED PHYSICAL EXAM: Deferred to follow-up.   Diet Order:   Diet Order             DIET - DYS 1 Room service appropriate? No; Fluid consistency: Nectar Thick  Diet effective now                  EDUCATION NEEDS:  Not  appropriate for education at this time  Skin:  Skin Assessment: Reviewed RN Assessment  Last BM:  10/3  Height:  Ht Readings from Last 1 Encounters:  03/21/24 5' 8 (1.727 m)   Weight:  Wt Readings from Last 1 Encounters:  03/21/24 79.2 kg   Ideal Body Weight:  70 kg  BMI:  There is no height or weight on file to calculate BMI.  Estimated Nutritional Needs:  Kcal:  1750-1950 Protein:  85-105 grams Fluid:  >/= 1.7 L   Nestora Glatter RD, LDN Clinical Dietitian

## 2024-04-01 NOTE — Evaluation (Signed)
 Physical Therapy Evaluation Patient Details Name: Thomas [name] MRN: [id number] DOB: Nov 23, 1928 Today's Date: 04/01/2024  History of Present Illness  Thomas [name] is a 89 y.o. male admitted on 03/31/24 from SNF for acute hypoxic respiratory failure secondary to aspiration pneumonia. 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  Clinical Impression  PTA, patient was at SNF for short term rehab following recent CVA. Patient is poor historian and demonstrates difficulty with communication. Patient demonstrates poor functional mobility, generalized weakness, poor activity tolerance, and deficits in cognitive function. Patient would benefit from continued skilled acute PT services to address the above impairments. Recommend d/c to post-acute rehab < 3 hours/day.      If plan is discharge home, recommend the following: Assistance with cooking/housework;Help with stairs or ramp for entrance;Assist for transportation;Supervision due to cognitive status;A lot of help with bathing/dressing/bathroom;Two people to help with walking and/or transfers;Assistance with feeding   Can travel by private vehicle   No    Equipment Recommendations None recommended by PT;Other (comment) (defer to next level of care)  Recommendations for Other Services       Functional Status Assessment Patient has had a recent decline in their functional status and demonstrates the ability to make significant improvements in function in a reasonable and predictable amount of time.     Precautions / Restrictions Precautions Precautions: Fall Recall of Precautions/Restrictions: Impaired Restrictions Weight Bearing Restrictions Per Provider Order: No      Mobility  Bed Mobility Overal bed mobility: Needs  Assistance Bed Mobility: Supine to Sit     Supine to sit: Total assist, +2 for physical assistance, HOB elevated Sit to supine: Total assist, +2 for physical assistance   General bed mobility comments: Poor ability to initiate sequencing and unable to complete trunk righting    Transfers                   General transfer comment: Not assessed as patient with up to maxA for seated balance at EOB.    Ambulation/Gait                  Stairs            Wheelchair Mobility     Tilt Bed    Modified Rankin (Stroke Patients Only)       Balance Overall balance assessment: Needs assistance Sitting-balance support: Bilateral upper extremity supported, Feet supported Sitting balance-Leahy Scale: Poor Sitting balance - Comments: Patient initially with maxA secondary to posterior trunk leaning. With multimodal cues and increased arousal, patient able to participate for brief periods with CGA. Tolerated  ~8-10 minutes at EOB Postural control: Posterior lean, Right lateral lean     Standing balance comment: unable to assess                             Pertinent Vitals/Pain Pain Assessment Pain Assessment: Faces Faces Pain Scale: Hurts a little bit Pain Intervention(s): Monitored during session, Repositioned, Other (comment) (Patient unable to describe where pain was located)    Home Living Family/patient expects to be discharged to:: Skilled nursing facility Living Arrangements: Spouse/significant other Available Help at Discharge: Family;Available 24 hours/day Type of Home: House Home Access: Level entry       Home Layout: One level Home Equipment: Rolling  Walker (2 wheels);Shower seat;Transport chair;Wheelchair - manual;BSC/3in1;Hospital bed;Lift chair Additional Comments: Pt lives with wife, son is over 2-3 times a week, lives close by.    Prior Function Prior Level of Function : History of Falls (last six months)              Mobility Comments: uses RW in home, w/c in community. 2-3 falls in the last 6 months ADLs Comments: wife helps with socks/shoes, wife helps with getting in/out of bath, able to wash himself     Extremity/Trunk Assessment   Upper Extremity Assessment Upper Extremity Assessment: Defer to OT evaluation    Lower Extremity Assessment Lower Extremity Assessment: Generalized weakness (difficult to formally assess due to cognitive impairments)    Cervical / Trunk Assessment Cervical / Trunk Assessment: Kyphotic  Communication   Communication Communication: Impaired Factors Affecting Communication: Hearing impaired    Cognition Arousal: Alert (Patient initially lethargic, needing to be aroused for therapy however with mobility demonstrated much improved alertness.) Behavior During Therapy: Flat affect   PT - Cognitive impairments: Sequencing, Problem solving, Safety/Judgement, Memory, Awareness, Orientation, No family/caregiver present to determine baseline   Orientation impairments: Person, Place, Situation                   PT - Cognition Comments: Patient able to state his name and birthday in August 20s however limited by cognitive deficits or aphasia. Stated he was in [place]  Following commands: Impaired Following commands impaired: Follows one step commands with increased time     Cueing Cueing Techniques: Verbal cues, Gestural cues, Tactile cues     General Comments      Exercises     Assessment/Plan    PT Assessment Patient needs continued PT services  PT Problem List Decreased activity tolerance;Decreased balance;Decreased mobility;Decreased knowledge of use of DME;Decreased safety awareness;Decreased knowledge of precautions;Decreased strength       PT Treatment Interventions DME instruction;Gait training;Functional mobility training;Therapeutic activities;Therapeutic exercise;Balance training;Patient/family education    PT Goals (Current goals can be  found in the Care Plan section)  Acute Rehab PT Goals Patient Stated Goal: unable to state PT Goal Formulation: Patient unable to participate in goal setting Time For Goal Achievement: 04/15/24 Potential to Achieve Goals: Fair    Frequency Min 2X/week     Co-evaluation PT/OT/SLP Co-Evaluation/Treatment: Yes Reason for Co-Treatment: Complexity of the patient's impairments (multi-system involvement);To address functional/ADL transfers PT goals addressed during session: Mobility/safety with mobility;Balance OT goals addressed during session: ADL's and self-care       AM-PAC PT 6 Clicks Mobility  Outcome Measure Help needed turning from your back to your side while in a flat bed without using bedrails?: A Lot Help needed moving from lying on your back to sitting on the side of a flat bed without using bedrails?: A Lot Help needed moving to and from a bed to a chair (including a wheelchair)?: Total Help needed standing up from a chair using your arms (e.g., wheelchair or bedside chair)?: Total Help needed to walk in hospital room?: Total Help needed climbing 3-5 steps with a railing? : Total 6 Click Score: 8    End of Session   Activity Tolerance: Patient limited by fatigue Patient left: with call bell/phone within reach;in bed;with bed alarm set (staff present for placing IV) Nurse Communication: Mobility status PT Visit Diagnosis: Muscle weakness (generalized) (M62.81);Other abnormalities of gait and mobility (R26.89)    Time: 9182-9160 PT Time Calculation (min) (ACUTE ONLY): 22 min   Charges:  PT Evaluation $PT Eval Low Complexity: 1 Low PT Treatments $Therapeutic Activity: 8-22 mins PT General Charges $$ ACUTE PT VISIT: 1 Visit         Sherryle Cascadia, PT, DPT [facility] Acute Rehabilitation Office: [phone]   Sherryle VEAR Tennyson 04/01/2024, 8:55 AM

## 2024-04-01 NOTE — Progress Notes (Signed)
     [name]           DOB: October 11, 1928  MRN: [id number]      Admission Date: 03/31/2024  Attending Provider: Georgina Basket, MD  Primary Diagnosis: Aspiration pneumonia ([name])   Level of care: Med-Surg   OVERNIGHT EVENT   Patient hyperglycemic, Hx type 2 diabetes.  Glucose levels ranging in the upper 400s-500s since admission.  Not in DKA given absence of ketones, BHA normal, normal anion gap, no significant metabolic acidosis. Glucosuria on UA.  Current CBG 480 being treated with 20 units subcu insulin .   Reassess in 3 hrs.     Anahy Esh, DNP, ACNPC- AG [facility] [place]

## 2024-04-01 NOTE — Evaluation (Signed)
 Occupational Therapy Evaluation Patient Details Name: Thomas [name] MRN: [id number] DOB: 07-03-1928 Today's Date: 04/01/2024   History of Present Illness   Naif Alabi is a 89 y.o. male admitted on 03/31/24 from SNF for acute hypoxic respiratory failure secondary to aspiration pneumonia. 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     Clinical Impressions Pt admitted based on above, and was seen based on problem list below. Pt with recent admission to hospital for CVA, was d/c to snf. No family or caregiver present to provide PLOF during rehab admission. Pt only oriented towards self, along with decreased initiation, sequencing, and memory. Today pt is requiring set up  to total assist for bed level ADLs. Bed mobility was total +2 assist, as pt not able to sequence or initiate movement. Sitting EOB pt initally requiring max assist to correct posterior lean, progressing to brief periods of CGA . Recommending pt return to continue completing <3 hours of skilled rehab daily. OT will continue to follow acutely to maximize functional independence.        If plan is discharge home, recommend the following:   Two people to help with walking and/or transfers;Two people to help with bathing/dressing/bathroom;Assistance with cooking/housework;Assist for transportation;Help with stairs or ramp for entrance     Functional Status Assessment   Patient has had a recent decline in their functional status and demonstrates the ability to make significant improvements in function in a reasonable and predictable amount of time.     Equipment Recommendations   Other (comment) (Defer to next venue)     Precautions/Restrictions   Precautions Precautions: Fall Recall of  Precautions/Restrictions: Impaired Restrictions Weight Bearing Restrictions Per Provider Order: No     Mobility Bed Mobility Overal bed mobility: Needs Assistance Bed Mobility: Supine to Sit, Sit to Supine     Supine to sit: Total assist, +2 for physical assistance, HOB elevated Sit to supine: Total assist, +2 for physical assistance   General bed mobility comments: Poor ability to initiate sequencing    Transfers     General transfer comment: Deferred for safety      Balance Overall balance assessment: Needs assistance Sitting-balance support: Bilateral upper extremity supported, Feet supported Sitting balance-Leahy Scale: Poor Sitting balance - Comments: Pt initially maxA w/ posterior lean, with time pt progressed to CGA briefly able to self correct at times.Tolerated  ~8-10 minutes at EOB Postural control: Posterior lean, Right lateral lean       ADL either performed or assessed with clinical judgement   ADL Overall ADL's : Needs assistance/impaired Eating/Feeding: Set up;Bed level Eating/Feeding Details (indicate cue type and reason): Initially mod assist from EOB, but progressed to set up with assist for sitting balance. Returned to bed still set up assist to feed Grooming: Wash/dry face;Minimal assistance;Bed level Grooming Details (indicate cue type and reason): Requiring use of bil hands to hold wash cloth and complete Upper Body Bathing: Minimal assistance;Bed level   Lower Body Bathing: Maximal assistance;Bed level   Upper Body Dressing : Moderate assistance;Bed level   Lower Body Dressing: Total assistance;Bed level       Toileting- Clothing Manipulation and Hygiene: Total assistance;Bed level         General ADL Comments: Pt requiring cueing for attending to tasks and sequencing, largely bed level d/t posterior lean sitting EOB  Vision   Vision Assessment?: No apparent visual deficits            Pertinent Vitals/Pain Pain Assessment Pain  Assessment: Faces Faces Pain Scale: Hurts a little bit Pain Intervention(s): Monitored during session     Extremity/Trunk Assessment Upper Extremity Assessment Upper Extremity Assessment: Generalized weakness;RUE deficits/detail;LUE deficits/detail RUE Deficits / Details: Poor grip and FMC, difficulty bringing hand to mouth. Shoulder appears WFL RUE Coordination: decreased fine motor;decreased gross motor LUE Deficits / Details: Able to grip utensil, tremors, able to bring hand to mouth, shoulder WFL LUE Coordination: decreased fine motor;decreased gross motor   Lower Extremity Assessment Lower Extremity Assessment: Defer to PT evaluation   Cervical / Trunk Assessment Cervical / Trunk Assessment: Kyphotic   Communication Communication Communication: Impaired Factors Affecting Communication: Hearing impaired   Cognition Arousal: Alert Behavior During Therapy: Flat affect Cognition: Cognition impaired   Orientation impairments: Place, Time, Situation Awareness: Intellectual awareness impaired, Online awareness impaired Memory impairment (select all impairments): Short-term memory, Working [employer] fast streamer, Non-declarative long-term memory, Geneticist, molecular long-term memory Attention impairment (select first level of impairment): Focused attention Executive functioning impairment (select all impairments): Initiation, Organization, Sequencing, Problem solving, Reasoning OT - Cognition Comments: Pt initally lethergic, but eventually arousable and remained aroused. Pt only oriented to self, reporting he is in [place] , poor initation and sequencing       Following commands: Impaired Following commands impaired: Follows one step commands with increased time     Cueing  General Comments   Cueing Techniques: Verbal cues;Gestural cues;Tactile cues  Left pt eating at bed level, elevated HOB to 45 degrees to prevent aspirations           Home Living Family/patient expects to be discharged  to:: Skilled nursing facility Living Arrangements: Spouse/significant other Available Help at Discharge: Family;Available 24 hours/day Type of Home: House Home Access: Level entry     Home Layout: One level     Bathroom Shower/Tub: Producer, television/film/video: Handicapped height     Home Equipment: Agricultural consultant (2 wheels);Shower seat;Transport chair;Wheelchair - manual;BSC/3in1;Hospital bed;Lift chair   Additional Comments: Per chart review pt was at SNF completing rehab post CVA, no family or caregiver present to provide additional info  Lives With: Spouse    Prior Functioning/Environment Prior Level of Function : Needs assist;History of Falls (last six months);Patient poor historian/Family not available             Mobility Comments: Per chart review prior to recent admission pt was using RW in home. Was d/c to snf, no family or caregiver present to provide additional information ADLs Comments: Per chart review prior to recent admission pt was mostly ind with ADLs. Was d/c to snf, no family or caregiver present to provide additional information    OT Problem List: Decreased strength;Decreased range of motion;Decreased activity tolerance;Impaired balance (sitting and/or standing);Impaired vision/perception;Decreased coordination;Decreased cognition;Decreased safety awareness;Impaired UE functional use;Pain   OT Treatment/Interventions: Self-care/ADL training;Therapeutic exercise;Energy conservation;DME and/or AE instruction;Therapeutic activities;Patient/family education;Balance training      OT Goals(Current goals can be found in the care plan section)   Acute Rehab OT Goals Patient Stated Goal: None stated OT Goal Formulation: Patient unable to participate in goal setting Time For Goal Achievement: 04/15/24 Potential to Achieve Goals: Good   OT Frequency:  Min 2X/week    Co-evaluation PT/OT/SLP Co-Evaluation/Treatment: Yes Reason for Co-Treatment:  Complexity of the patient's impairments (multi-system involvement);To address functional/ADL transfers PT goals addressed during session: Mobility/safety with mobility;Balance OT goals addressed during session:  ADL's and self-care      AM-PAC OT 6 Clicks Daily Activity     Outcome Measure Help from another person eating meals?: A Little Help from another person taking care of personal grooming?: A Little Help from another person toileting, which includes using toliet, bedpan, or urinal?: Total Help from another person bathing (including washing, rinsing, drying)?: Total Help from another person to put on and taking off regular upper body clothing?: A Lot Help from another person to put on and taking off regular lower body clothing?: Total 6 Click Score: 11   End of Session Nurse Communication: Mobility status  Activity Tolerance: Patient tolerated treatment well Patient left: in bed;with call bell/phone within reach;with nursing/sitter in room;with bed alarm set  OT Visit Diagnosis: Unsteadiness on feet (R26.81);Other abnormalities of gait and mobility (R26.89);Muscle weakness (generalized) (M62.81);Other symptoms and signs involving cognitive function                Time: 9184-9158 OT Time Calculation (min): 26 min Charges:  OT General Charges $OT Visit: 1 Visit OT Evaluation $OT Eval Moderate Complexity: 1 Mod  Adrianne BROCKS, OT  Acute Rehabilitation Services Office [phone] Secure chat preferred   Adrianne GORMAN Savers 04/01/2024, 9:25 AM

## 2024-04-01 NOTE — Hospital Course (Addendum)
 89 y.o. M with pAF and SSS s/p PPM, on Eliquis , recent stroke, DM, HTN, and hypothyroidism admitted from rehab with hypoxia and decreased responsiveness after an aspiration event.

## 2024-04-02 ENCOUNTER — Inpatient Hospital Stay (HOSPITAL_COMMUNITY)

## 2024-04-02 DIAGNOSIS — I69391 Dysphagia following cerebral infarction: Secondary | ICD-10-CM

## 2024-04-02 DIAGNOSIS — A419 Sepsis, unspecified organism: Secondary | ICD-10-CM | POA: Diagnosis not present

## 2024-04-02 DIAGNOSIS — R627 Adult failure to thrive: Secondary | ICD-10-CM

## 2024-04-02 DIAGNOSIS — E440 Moderate protein-calorie malnutrition: Secondary | ICD-10-CM | POA: Insufficient documentation

## 2024-04-02 DIAGNOSIS — J690 Pneumonitis due to inhalation of food and vomit: Secondary | ICD-10-CM | POA: Diagnosis not present

## 2024-04-02 DIAGNOSIS — Z7189 Other specified counseling: Secondary | ICD-10-CM

## 2024-04-02 DIAGNOSIS — Z515 Encounter for palliative care: Secondary | ICD-10-CM | POA: Diagnosis not present

## 2024-04-02 LAB — BASIC METABOLIC PANEL WITH GFR
Anion gap: 9 (ref 5–15)
BUN: 49 mg/dL — ABNORMAL HIGH (ref 8–23)
CO2: 24 mmol/L (ref 22–32)
Calcium: 8.2 mg/dL — ABNORMAL LOW (ref 8.9–10.3)
Chloride: 118 mmol/L — ABNORMAL HIGH (ref 98–111)
Creatinine, Ser: 1.52 mg/dL — ABNORMAL HIGH (ref 0.61–1.24)
GFR, Estimated: 42 mL/min — ABNORMAL LOW (ref >60)
Glucose, Bld: 456 mg/dL — ABNORMAL HIGH (ref 70–99)
Potassium: 3.8 mmol/L (ref 3.5–5.1)
Sodium: 151 mmol/L — ABNORMAL HIGH (ref 135–145)

## 2024-04-02 LAB — CBC
HCT: 33.5 % — ABNORMAL LOW (ref 39.0–52.0)
Hemoglobin: 10.4 g/dL — ABNORMAL LOW (ref 13.0–17.0)
MCH: 33 pg (ref 26.0–34.0)
MCHC: 31 g/dL (ref 30.0–36.0)
MCV: 106.3 fL — ABNORMAL HIGH (ref 80.0–100.0)
Platelets: 229 K/uL (ref 150–400)
RBC: 3.15 MIL/uL — ABNORMAL LOW (ref 4.22–5.81)
RDW: 13 % (ref 11.5–15.5)
WBC: 14.3 K/uL — ABNORMAL HIGH (ref 4.0–10.5)
nRBC: 0 % (ref 0.0–0.2)

## 2024-04-02 LAB — GLUCOSE, CAPILLARY
Glucose-Capillary: 299 mg/dL — ABNORMAL HIGH (ref 70–99)
Glucose-Capillary: 383 mg/dL — ABNORMAL HIGH (ref 70–99)
Glucose-Capillary: 192 mg/dL — ABNORMAL HIGH (ref 70–99)
Glucose-Capillary: 151 mg/dL — ABNORMAL HIGH (ref 70–99)

## 2024-04-02 MED ORDER — SODIUM CHLORIDE 0.45 % IV SOLN: INTRAVENOUS | Status: DC

## 2024-04-02 MED ORDER — INSULIN ASPART 100 UNIT/ML IJ SOLN: 0.0000 [IU] | INTRAMUSCULAR | Status: DC

## 2024-04-02 MED ORDER — INSULIN ASPART 100 UNIT/ML IJ SOLN
0.0000 [IU] | INTRAMUSCULAR | Status: DC
  Administered 2024-04-02: 4 [IU] via SUBCUTANEOUS
  Administered 2024-04-02: 20 [IU] via SUBCUTANEOUS
  Administered 2024-04-02: 4 [IU] via SUBCUTANEOUS
  Administered 2024-04-03 (×2): 3 [IU] via SUBCUTANEOUS
  Administered 2024-04-03: 4 [IU] via SUBCUTANEOUS

## 2024-04-02 NOTE — Telephone Encounter (Signed)
 Of note, presently admitted.   Reasonable to provide 30 day Rx with 1 refill and recommend schedule overdue cardiology follow up.   Joandry Slagter S Cheyne Bungert, NP

## 2024-04-02 NOTE — Inpatient Diabetes Management (Signed)
 Inpatient Diabetes Program Recommendations  AACE/ADA: New Consensus Statement on Inpatient Glycemic Control   Target Ranges:  Prepandial:   less than 140 mg/dL      Peak postprandial:   less than 180 mg/dL (1-2 hours)      Critically ill patients:  140 - 180 mg/dL    Latest Reference Range & Units 04/01/24 09:10 04/01/24 12:04 04/01/24 14:49 04/01/24 14:55 04/01/24 16:57 04/01/24 21:43 04/02/24 08:03  Glucose-Capillary 70 - 99 mg/dL 809 (H) 651 (H) 556 (H) 410 (H) 415 (H) 430 (H) 299 (H)   Review of Glycemic Control  Diabetes history: DM2  Outpatient Diabetes medications: Lantus  5 units daily   Current orders for Inpatient glycemic control: Novolog  0-20 units TID + HS, Lantus  13 units daily.   Inpatient Diabetes Program Recommendations:   Insulin : Please consider increasing Lantus  to 16 units daily and adding Novlog 4 units TID with meals for meal coverage (if patient consumes atleast 50% of meals).    Thanks,  Lavanda Search, RN, MSN, [facility]  Inpatient Diabetes Coordinator  Pager [phone] (8a-5p)

## 2024-04-02 NOTE — TOC Progression Note (Signed)
 Transition of Care [facility]) - Progression Note    Patient Details  Name: Thomas [name] MRN: [id number] Date of Birth: 04-23-1929  Transition of Care [facility]) CM/SW Contact  Takeysha Bonk LITTIE Moose, [place] Phone Number: 04/02/2024, 12:38 PM  Clinical Narrative:    CSW contacted [facility] to inquire if pt could return when medically ready to DC/ ask if he would need a new auth. CSW awaiting response, will continue to follow.  CSW spoke with Kristin at [facility], she stated there was no bed hold put in place at facility for pt but to check in with her when pt nearing dc to see if they have a bed available. Pt will need new insurance auth.    Expected Discharge Plan: Skilled Nursing Facility Barriers to Discharge: Continued Medical Work up               Expected Discharge Plan and Services     Post Acute Care Choice: Skilled Nursing Facility Living arrangements for the past 2 months: Single Family Home                                       Social Drivers of Health (SDOH) Interventions SDOH Screenings   Food Insecurity: No Food Insecurity (03/31/2024)  Housing: Low Risk  (03/31/2024)  Transportation Needs: No Transportation Needs (03/31/2024)  Utilities: Not At Risk (03/31/2024)  Alcohol Screen: Low Risk  (10/10/2023)  Depression (PHQ2-9): Low Risk  (02/06/2024)  Financial Resource Strain: Low Risk  (10/10/2023)  Physical Activity: Insufficiently Active (10/10/2023)  Social Connections: Unknown (03/31/2024)  Stress: No Stress Concern Present (10/10/2023)  Tobacco Use: Medium Risk (03/31/2024)  Health Literacy: Adequate Health Literacy (10/10/2023)    Readmission Risk Interventions     No data to display

## 2024-04-02 NOTE — TOC Initial Note (Signed)
 Transition of Care [facility]) - Initial/Assessment Note    Patient Details  Name: Thomas [name] MRN: [id number] Date of Birth: 09/04/1928  Transition of Care [facility]) CM/SW Contact:    Jeoffrey LITTIE Moose, [place] Phone Number: 04/02/2024, 9:35 AM  Clinical Narrative:                 Pt admitted from [facility] SNF due to increased respirations and O2 87% on room air. CSW following for needs.   Expected Discharge Plan: Skilled Nursing Facility Barriers to Discharge: Continued Medical Work up   Patient Goals and CMS Choice Patient states their goals for this hospitalization and ongoing recovery are:: SNF          Expected Discharge Plan and Services     Post Acute Care Choice: Skilled Nursing Facility Living arrangements for the past 2 months: Single Family Home                                      Prior Living Arrangements/Services Living arrangements for the past 2 months: Single Family Home Lives with:: Spouse Patient language and need for interpreter reviewed:: Yes Do you feel safe going back to the place where you live?: Yes      Need for Family Participation in Patient Care: Yes (Comment) Care giver support system in place?: Yes (comment)   Criminal Activity/Legal Involvement Pertinent to Current Situation/Hospitalization: No - Comment as needed  Activities of Daily Living   ADL Screening (condition at time of admission) Independently performs ADLs?: No Does the patient have a NEW difficulty with bathing/dressing/toileting/self-feeding that is expected to last >3 days?: Yes (Initiates electronic notice to provider for possible OT consult) Does the patient have a NEW difficulty with getting in/out of bed, walking, or climbing stairs that is expected to last >3 days?: Yes (Initiates electronic notice to provider for possible PT consult) Does the patient have a NEW difficulty with communication that is expected to last >3 days?: No Is the patient deaf or have  difficulty hearing?: Yes Does the patient have difficulty seeing, even when wearing glasses/contacts?: No Does the patient have difficulty concentrating, remembering, or making decisions?: Yes  Permission Sought/Granted Permission sought to share information with : Facility Medical sales representative, Family Supports    Share Information with NAME: Reena  Permission granted to share info w AGENCY: [employer]  Permission granted to share info w Relationship: Spouse  Permission granted to share info w Contact Information: [phone]  Emotional Assessment Appearance:: Appears stated age Attitude/Demeanor/Rapport: Unable to Assess Affect (typically observed): Unable to Assess Orientation: : Oriented to Self Alcohol / Substance Use: Not Applicable Psych Involvement: No (comment)  Admission diagnosis:  Aspiration pneumonia ([name]) [J69.0] Hyperglycemia [R73.9] Hypoxia [R09.02] Sepsis, due to unspecified organism, unspecified whether acute organ dysfunction present [facility]) [A41.9] Patient Active Problem List   Diagnosis Date Noted   Aspiration pneumonia ([name]) 03/31/2024   Thoracic ascending aortic aneurysm 03/26/2024   DNR (do not resuscitate) 03/21/2024   PAF (paroxysmal atrial fibrillation) ([name]) 03/21/2024   History of pulmonary embolism 03/21/2024   Actinic keratosis 02/06/2024   Seborrheic dermatitis 02/06/2024   Heel callus 02/14/2023   Onychomycosis 07/30/2022   Constipation 07/30/2022   Edema of foot 04/22/2022   Clotting disorder 03/16/2021   Orthostatic hypotension 10/01/2020   Normocytic anemia 08/31/2020   Prolonged QT interval 08/31/2020   Acute CVA (cerebrovascular accident) ([name]) 08/31/2020   Debility 08/21/2020  Lytic bone lesion of femur 08/21/2020   Sick sinus syndrome ([name]) 07/31/2020   Coronary artery disease s/p CABG 2001 07/17/2020   Acquired hypothyroidism 07/17/2020   Hypertension associated with diabetes ([name]) 07/17/2020   Cardiac pacemaker - PPM  and leads are MRI compatible. 07/17/2020   Osteoarthritis 07/17/2020   Type 2 diabetes mellitus with hyperglycemia ([name]) 07/17/2020   Vitamin D  deficiency 07/17/2020   Macular degeneration 07/17/2020   Dyslipidemia due to type 2 diabetes mellitus ([name]) 07/17/2020   Insomnia 07/17/2020   BPH (benign prostatic hyperplasia) 07/17/2020   Colon polyps 07/17/2020   PCP:  Kennyth Worth HERO, MD Pharmacy:   [facility] [id number] - [place], [place] - [address] [place] [address] Phone: [phone] Fax: [phone]     Social Drivers of Health (SDOH) Social History: SDOH Screenings   Food Insecurity: No Food Insecurity (03/31/2024)  Housing: Low Risk  (03/31/2024)  Transportation Needs: No Transportation Needs (03/31/2024)  Utilities: Not At Risk (03/31/2024)  Alcohol Screen: Low Risk  (10/10/2023)  Depression (PHQ2-9): Low Risk  (02/06/2024)  Financial Resource Strain: Low Risk  (10/10/2023)  Physical Activity: Insufficiently Active (10/10/2023)  Social Connections: Unknown (03/31/2024)  Stress: No Stress Concern Present (10/10/2023)  Tobacco Use: Medium Risk (03/31/2024)  Health Literacy: Adequate Health Literacy (10/10/2023)   SDOH Interventions:     Readmission Risk Interventions     No data to display

## 2024-04-02 NOTE — Progress Notes (Signed)
  Progress Note   Patient: Thomas [name] FMW:[id number] DOB: 29-May-1929 DOA: 03/31/2024     2 DOS: the patient was seen and examined on 04/02/2024 at 9:44AM      Brief hospital course: 89 y.o. M with pAF and SSS s/p PPM, on ELiquis , recent stroke, DM, HTN, and hypothyroidism admitted from rehab with hypoxia and decreased responsiveness after an aspiration event.        Assessment and Plan: Sepsis due to aspiration pneumonia Acute respiratory failure with hypoxia See note from 10/5 -Continue Rocephin, day 3 of 5 - Continue respiratory therapies   Dysphagia MBS today showed substantial aspiration of thin and nectar thick liquids.  Even honey thick liquids leg around the epiglottis for a long time, likely will be aspirated.  In light of #3 below, and his advanced age, high degree of concern that he will not be able to keep up with his fluid intake needs long term. - NPO tonight - IV fluids - SLP consult - Palliative care consulted for delineating GOC     Hypernatremia Sodium worsened to 151 - Continue hypotonic fluids   Acute metabolic encephalopathy At baseline prior to stroke, the patient had no significant memory loss.  Since his stroke, he has been more disoriented, but has been alert and responsive to questions.  On arrival he was poorly responsive due to sepsis.  He has waxing and waning attention due to hospital delirium - Delirium precautions      AKI on CKD IIIa Creatinine up to 2 in the setting of sepsis, now improved to baseline. - Continue IV fluids - Avoid nephrotoxins, hypotension - Hold Lasix     Recent stroke Cerebrovascular disease Hypertension See summary 10/5 -Continue aspirin , apixaban , rosuvastatin  - Hold furosemide    Diabetes with hyperglycemia Patient had glucose 500 on admission, has improved somewhat, but remains persistently very high.  This is a barrier to providing hypotonic fluids for his hypernatremia - Continue insulin , titrate up  dose   Hypothyroidism -Continue levothyroxine    Paroxysmal atrial fibrillation Sick sinus syndrome Pacemaker in place Heart rate controlled -Continue amiodarone , apixaban , carvedilol    History of orthostasis Uses midodrine  at baseline - Hold midodrine            Subjective: Patient denies chest pain, cough, dyspnea.  He is very sluggish, gives one-word answers, unable to articulate symptoms.  Family have not noticed any concerning signs.     Physical Exam: BP 123/67 (BP Location: Right Arm)   Pulse 69   Temp 99 F (37.2 C) (Oral)   Resp 16   SpO2 98%   Elderly adult male, lying in bed, somnolent but rouses slowly, makes eye contact briefly RRR, no murmurs, no peripheral edema Respiratory rate normal, lungs diminished but no rales appreciated Abdomen soft, no TTP Attention diminihsed, right sided weakness, oriented to self, family only.      Data Reviewed: Discussed with palliative care BMP shows Na up to 151, Cr table at 1.5 WBC improving, Hgb 10    Family Communication: Son and wife    Disposition: Status is: Inpatient         Author: Lonni SHAUNNA Dalton, MD 04/02/2024 5:48 PM  For on call review [email or web address].

## 2024-04-02 NOTE — Consult Note (Signed)
 Consultation Note Date: 04/02/2024   [name]  DOB: 1928-11-08  MRN: [id number]  Age / Sex: 89 y.o., male  PCP: Kennyth Worth HERO, MD Referring Physician: Jonel Lonni SQUIBB, *  Reason for Consultation: Establishing goals of care  HPI/Patient Profile: 89 y.o. male  with past medical history of type 2 diabetes, hypertension, hypothyroidism, paroxysmal A-fib on Eliquis , sick sinus syndrome status post pacemaker placement, and recent admission on  for acute CVA (left thalamic infarct and Small acute to subacute left frontal lobe infarcts)  admitted on 03/31/2024 with dyspnea at SNF.   Of note, since CVA on 9/23, pt has been bed bound with intermittent lethargy and able to tol PO infrequently. In the ED, pt febrile 100.9, hypertensive, and tachypneic W/O hypoxia. Labs notable for na 148, glucose 606, BNP 229, lactic acid 2.4-->1.9, and WBC 16.4.  He was admitted with acute hypoxic respiratory failure and sepsis due to aspiration pneumonia, AKI and failure to thrive.  MBS on 10/6 went poorly, patient now n.p.o. PMT has been consulted to assist with goals of care conversation, anticipatory care needs, complex medical decision making.  Clinical Assessment and Goals of Care:  I have reviewed medical records including EPIC notes, labs and imaging, assessed the patient and then met at the bedside with patient's wife and son to discuss diagnosis prognosis, GOC, EOL wishes, disposition and options.  I introduced Palliative Medicine as specialized medical care for people living with serious illness. It focuses on providing relief from the symptoms and stress of a serious illness. The goal is to improve quality of life for both the patient and the family.  We discussed a brief life review of the patient and then focused on their current illness.  The natural disease trajectory and expectations at EOL were discussed.  I attempted to elicit values and goals of  care important to the patient.    Medical History Review and Understanding:  We discussed patient's acute illness in the context of their chronic comorbidities.  Patient's family understand the severity of patient's illness.  Social History: Patient is married with 1 son.  Came from SNF.  Was at home before then.  Functional and Nutritional State: Patient has been bedbound since recent acute stroke.  Albumin of 3.3 on 9/23.  Advance Directives: A detailed discussion regarding advanced directives was had.  No documentation currently on file.  Code Status: Concepts specific to code status, artifical feeding and hydration, and rehospitalization were considered and discussed.   Discussion: Patient's son shares that they have just gotten off the phone with patient's SNF and are trying to navigate how to keep his bed if possible, given that they have had a good experience so far.  We discussed patient's current care plan and patient's needs for nutrition in order to return to SNF and participate effectively in therapy.  He was also curious about what palliative care looks like in different outpatient settings.  Counseled on the need to determine a long-term plan for patient's nutrition prior to discharge, as he is high risk for recurrent hospitalizations and aspiration events, worsening quality of life, and further complications from his stroke and overall failure to thrive.  Family shares that they have never discussed his wishes for medical care or anything specific such as artificial nutrition.  I shared my concern that this is likely to cause some level of distress with his confusion and unlikely to improve overall quality of life/functioning.  We had extensive discussion about what it  would look like to allow for comfort feeds and accept the risk of aspiration.  Reviewed benefits of hospice support in that circumstance, as well as the settings hospice support would be available.  They initially had  a meeting scheduled with SNF at 2 PM tomorrow, now agreeable to meet with myself and primary attending tomorrow 2 PM to discuss thoughts and feelings on next [facility] patient's care after more time to reflect on his personality and priorities.   The difference between aggressive medical intervention and comfort care was considered in light of the patient's goals of care. Hospice and Palliative Care services outpatient were explained and offered.   Discussed the importance of continued conversation with family and the medical providers regarding overall plan of care and treatment options, ensuring decisions are within the context of the patient's values and GOCs.   Questions and concerns were addressed.  Hard Choices booklet left for review. The family was encouraged to call with questions or concerns.  PMT will continue to support holistically.   SUMMARY OF RECOMMENDATIONS   -Continue DNR/DNI -Continue current care plan -Patient's wife and son are unsure at this time regarding preferences for artificial nutrition versus careful consistency of diet with palliative follow-up versus comfort feeding and hospice.  Patient is at high risk for aspiration with all options -Family meeting scheduled for tomorrow 10/7 at 2 PM for continued goals of care discussion -Psychosocial and emotional support provided -PMT will continue to follow and support  Prognosis:  Concerning prognosis with high risk for further decline and complications  Discharge Planning: To Be Determined      Primary Diagnoses: Present on Admission:  Aspiration pneumonia [facility])   Physical Exam Vitals and nursing note reviewed.  Constitutional:      General: He is not in acute distress.    Appearance: He is ill-appearing.  HENT:     Head: Normocephalic and atraumatic.  Cardiovascular:     Rate and Rhythm: Normal rate.  Pulmonary:     Effort: Pulmonary effort is normal.  Skin:    General: Skin is warm and dry.   Neurological:     Mental Status: He is lethargic.     Vital Signs: BP (!) 153/73 (BP Location: Left Leg)   Pulse 75   Temp 98.6 F (37 C) (Oral)   Resp 16   SpO2 97%  Pain Scale: 0-10   Pain Score: 0-No pain   SpO2: SpO2: 97 % O2 Device:SpO2: 97 % O2 Flow Rate: .    Palliative Assessment/Data: 20%    Amore Ackman P Kameran Mcneese, PA-C  Palliative Medicine Team Team phone # [phone]  Thank you for allowing the Palliative Medicine Team to assist in the care of this patient. Please utilize secure chat with additional questions, if there is no response within 30 minutes please call the above phone number.  Palliative Medicine Team providers are available by phone from 7am to 7pm daily and can be reached through the team cell phone.  Should this patient require assistance outside of these hours, please call the patient's attending physician.    Time Total: 75  Visit consisted of counseling and education dealing with the complex and emotionally intense issues of symptom management and palliative care in the setting of serious and potentially life-threatening illness. Greater than 50% of this time was spent counseling and coordinating care related to the above assessment and plan.  Personally spent 75 minutes in patient care including extensive chart review (labs, imaging, progress/consult notes, vital signs), medically  appropraite exam, discussed with treatment team, education to patient, family, and staff, documenting clinical information, medication review and management, coordination of care, and available advanced directive documents.

## 2024-04-02 NOTE — Progress Notes (Signed)
 Nutrition Follow-up  DOCUMENTATION CODES:   Non-severe (moderate) malnutrition in context of acute illness/injury  INTERVENTION:  RD will follow along per GOC with Palliative   NUTRITION DIAGNOSIS:   Moderate Malnutrition related to acute illness, dysphagia (Stroke) as evidenced by moderate muscle depletion, moderate fat depletion.  GOAL:   Patient will meet greater than or equal to 90% of their needs   MONITOR:   Diet advancement, Labs, I & O's  REASON FOR ASSESSMENT:   Consult Enteral/tube feeding initiation and management, Calorie Count  ASSESSMENT:  89 y.o. male presented to the ED with acute respiratory failure likely due to aspiration. PMH includes T2DM, CAD, HLD, HTN, prior CVA, BPH, sick sinus syndrome, s/p pacemaker. Pt admitted with acute respiratory failure likely due to aspiration event, AKI, and FTT.   Calorie count ordered yesterday, pt was doing well, eating 50-100% of his meals. Had 100% of his eggs and sausage this morning with 100% of juice and 100% of milk. Unfortunately had MBS today and it ws deemed pt unsafe to have po safely without aspirating. Placed NPO. Palliative meant with family, family does not want PEG. Other options would be liberalized diet and home with Hospice or 2. Modified consistency diet with outpatient palliative. Concerned if pt was on thickened liquids pt would not meet his fluid needs, sodium at 151 today.   Wife at bedside and son report pt was doing well until he had his stroke last month. Was discharged to SNF. Since then family has noticed a slight decline in pt's overall health bu they still report pt had a good appetite and would eat  close to 100% of his 3 meals per day. 1 Ensure daily. Stated UBW of 180 lbs prior to pt's stroke. Requested new bed weight from RN. *Pt has lost 6 lbs. 3%, in 3 months. Suspect this will be lower with updated weight.   At baseline pt with waxing and waning confusion. Family reports pt Has been bedbound  since his stoke and has needed help feeding himself.   Admit weight: 79.2 kg from 9/24 Current weight: 79.2 kg  03/21/24 79.2 kg  02/06/24 81.2 kg  11/01/23 82.1 kg  10/10/23 81.2 kg  08/04/23 81.4 kg  03/02/23 82.3 kg  01/31/23 83 kg  *Pt has lost 6 lbs. 3%, in 3 months. Suspect this will be lower with updated weight.     Average Meal Intake: NPO  Nutritionally Relevant Medications: Scheduled Meds:  amiodarone   50 mg Oral Daily   apixaban   2.5 mg Oral BID   aspirin   81 mg Oral Daily   insulin  aspart  0-20 Units Subcutaneous Q4H   insulin  glargine  13 Units Subcutaneous Daily   levothyroxine   112 mcg Oral Q0600   multivitamin with minerals  1 tablet Oral Daily   rosuvastatin   20 mg Oral QHS   tamsulosin   0.8 mg Oral QPC supper   Continuous Infusions:  sodium chloride  100 mL/hr at 04/02/24 1318   cefTRIAXone (ROCEPHIN)  IV 2 g (04/02/24 1321)   PRN Meds:.carvedilol   Labs Reviewed: Sodium 151 BUN 49 Creatinine 1.52 GFR 42 CBG ranges from 190-443 mg/dL over the last 24 hours HgbA1c 8.1  NUTRITION - FOCUSED PHYSICAL EXAM:  Flowsheet Row Most Recent Value  Orbital Region Moderate depletion  Upper Arm Region Mild depletion  Thoracic and Lumbar Region No depletion  Buccal Region Moderate depletion  Temple Region Moderate depletion  Clavicle Bone Region Mild depletion  Clavicle and Acromion Bone Region Mild depletion  Scapular Bone Region Mild depletion  Dorsal Hand Mild depletion  Patellar Region Moderate depletion  Anterior Thigh Region Moderate depletion  Posterior Calf Region Moderate depletion  Edema (RD Assessment) None  Hair Reviewed  Eyes Unable to assess  Mouth Unable to assess  Skin Reviewed  Nails Reviewed    Diet Order:   Diet Order             Diet NPO time specified Except for: Other (See Comments), Sips with Meds  Diet effective now                   EDUCATION NEEDS:   Not appropriate for education at this time  Skin:  Skin  Assessment: Reviewed RN Assessment  Last BM:  10/3  Height:   Ht Readings from Last 1 Encounters:  03/21/24 5' 8 (1.727 m)    Weight:   Wt Readings from Last 1 Encounters:  03/21/24 79.2 kg    Ideal Body Weight:  70 kg  BMI:  There is no height or weight on file to calculate BMI.  Estimated Nutritional Needs:   Kcal:  1750-1950  Protein:  85-105 grams  Fluid:  >/= 1.7 L    Thomas [name], RD Registered Dietitian  See Amion for more information

## 2024-04-02 NOTE — Evaluation (Addendum)
 Modified Barium Swallow Study  Patient Details  Name: Thomas [name] MRN: [id number] Date of Birth: November 29, 1928  Today's Date: 04/02/2024  Modified Barium Swallow completed.  Full report located under Chart Review in the Imaging Section.  History of Present Illness     Clinical Impression Pt has a significant oropharyngeal dysphagia with DIGEST score of 3 suggestive of severe dysphagia. Oral preparation is slow and posterior transit becomes more disorganized as boluses become thicker. This leaves more oral residue, at worst with the majority of the bolus remaining in his mouth. As he works to propel this residue, some of it spills into his pharynx where it pools until he initiates a swallow. Pharyngeally he has reduced base of tongue retraction, pharyngeal squeeze, and hyolaryngeal movement. His posture also puts his pharynx more horizontally, which further contributes to reduced efficiency with a collection of residue in his valleculae. Pt also has reduced laryngeal vestibule closure and does not have adequate airway closure or sensation, resulting in silent aspiration of thin and nectar thick liquids (PAS 8). When cued to cough he inconsistently produces a cough, and when he does, it is too weak to clear even his laryngeal vestibule, so penetrates are also aspirated across the course of the study. He did not appear to add more penetration or aspiration with honey thick liquids or solids. Although not overtly aspirated during this study, there is still ongoing risk for aspiration across a meal given the amount of residue. Attempted a more reclined posture to put his pharynx in a more vertical position, but this resulted in increased difficulty with epiglottic deflection given additional structural component from previous cervical hardware and suspected cervical osteophytes.   A PO diet would not be without risk at this point. Reached out to MD, and spoke with both him and the pt's son about findings.  Plan for now is to be NPO, allowing meds crushed in puree. May also want to offer a few pieces of ice with nurse after oral care to help keep some moisture and to prevent secretions from getting too thick. Palliative care meeting pending for further discussion regarding GOC.  DIGEST Swallow Severity Rating*  Safety: 4  Efficiency: 1  Overall Pharyngeal Swallow Severity: 3 1: mild; 2: moderate; 3: severe; 4: profound  *The Dynamic Imaging Grade of Swallowing Toxicity is standardized for the head and neck cancer population, however, demonstrates promising clinical applications across populations to standardize the clinical rating of pharyngeal swallow safety and severity.   Factors that may increase risk of adverse event in presence of aspiration Noe & Lianne 2021): Limited mobility;Frail or deconditioned;Weak cough;Aspiration of thick, dense, and/or acidic materials;Frequent aspiration of large volumes  Swallow Evaluation Recommendations Recommendations: NPO except meds;Ice chips PRN after oral care Medication Administration: Crushed with puree Oral care recommendations: Oral care QID (4x/day);Oral care before ice chips/water Caregiver Recommendations: Have oral suction available      Leita SAILOR., M.A. CCC-SLP Acute Rehabilitation Services Office: [phone]  Secure chat preferred  04/02/2024,1:07 PM

## 2024-04-03 DIAGNOSIS — R739 Hyperglycemia, unspecified: Secondary | ICD-10-CM

## 2024-04-03 DIAGNOSIS — A419 Sepsis, unspecified organism: Secondary | ICD-10-CM | POA: Diagnosis not present

## 2024-04-03 DIAGNOSIS — E440 Moderate protein-calorie malnutrition: Secondary | ICD-10-CM

## 2024-04-03 DIAGNOSIS — R0902 Hypoxemia: Secondary | ICD-10-CM

## 2024-04-03 DIAGNOSIS — J690 Pneumonitis due to inhalation of food and vomit: Secondary | ICD-10-CM | POA: Diagnosis not present

## 2024-04-03 DIAGNOSIS — Z515 Encounter for palliative care: Secondary | ICD-10-CM | POA: Diagnosis not present

## 2024-04-03 LAB — CBC
HCT: 27.2 % — ABNORMAL LOW (ref 39.0–52.0)
Hemoglobin: 8.7 g/dL — ABNORMAL LOW (ref 13.0–17.0)
MCH: 33.6 pg (ref 26.0–34.0)
MCHC: 32 g/dL (ref 30.0–36.0)
MCV: 105 fL — ABNORMAL HIGH (ref 80.0–100.0)
Platelets: 179 K/uL (ref 150–400)
RBC: 2.59 MIL/uL — ABNORMAL LOW (ref 4.22–5.81)
RDW: 12.7 % (ref 11.5–15.5)
WBC: 14.2 K/uL — ABNORMAL HIGH (ref 4.0–10.5)
nRBC: 0 % (ref 0.0–0.2)

## 2024-04-03 LAB — RENAL FUNCTION PANEL
Albumin: 2.2 g/dL — ABNORMAL LOW (ref 3.5–5.0)
Anion gap: 9 (ref 5–15)
BUN: 31 mg/dL — ABNORMAL HIGH (ref 8–23)
CO2: 24 mmol/L (ref 22–32)
Calcium: 7.9 mg/dL — ABNORMAL LOW (ref 8.9–10.3)
Chloride: 116 mmol/L — ABNORMAL HIGH (ref 98–111)
Creatinine, Ser: 1.15 mg/dL (ref 0.61–1.24)
GFR, Estimated: 59 mL/min — ABNORMAL LOW (ref >60)
Glucose, Bld: 130 mg/dL — ABNORMAL HIGH (ref 70–99)
Phosphorus: 3.3 mg/dL (ref 2.5–4.6)
Potassium: 3.4 mmol/L — ABNORMAL LOW (ref 3.5–5.1)
Sodium: 149 mmol/L — ABNORMAL HIGH (ref 135–145)

## 2024-04-03 LAB — GLUCOSE, CAPILLARY
Glucose-Capillary: 119 mg/dL — ABNORMAL HIGH (ref 70–99)
Glucose-Capillary: 124 mg/dL — ABNORMAL HIGH (ref 70–99)
Glucose-Capillary: 132 mg/dL — ABNORMAL HIGH (ref 70–99)
Glucose-Capillary: 164 mg/dL — ABNORMAL HIGH (ref 70–99)
Glucose-Capillary: 314 mg/dL — ABNORMAL HIGH (ref 70–99)
Glucose-Capillary: 345 mg/dL — ABNORMAL HIGH (ref 70–99)

## 2024-04-03 MED ORDER — INSULIN ASPART 100 UNIT/ML IJ SOLN
0.0000 [IU] | Freq: Every day | INTRAMUSCULAR | Status: DC
  Administered 2024-04-03: 4 [IU] via SUBCUTANEOUS
  Administered 2024-04-05: 3 [IU] via SUBCUTANEOUS

## 2024-04-03 MED ORDER — INSULIN ASPART 100 UNIT/ML IJ SOLN
0.0000 [IU] | Freq: Three times a day (TID) | INTRAMUSCULAR | Status: DC
  Administered 2024-04-03: 15 [IU] via SUBCUTANEOUS
  Administered 2024-04-04: 7 [IU] via SUBCUTANEOUS
  Administered 2024-04-04: 20 [IU] via SUBCUTANEOUS
  Administered 2024-04-04: 7 [IU] via SUBCUTANEOUS
  Administered 2024-04-05: 22 [IU] via SUBCUTANEOUS
  Administered 2024-04-05: 20 [IU] via SUBCUTANEOUS
  Administered 2024-04-05: 11 [IU] via SUBCUTANEOUS
  Administered 2024-04-06: 4 [IU] via SUBCUTANEOUS

## 2024-04-03 MED ORDER — FINASTERIDE 5 MG PO TABS
5.0000 mg | ORAL_TABLET | Freq: Every day | ORAL | Status: DC
  Administered 2024-04-04 – 2024-04-06 (×3): 5 mg via ORAL
  Filled 2024-04-03 (×3): qty 1

## 2024-04-03 MED ORDER — POTASSIUM CHLORIDE IN NACL 20-0.45 MEQ/L-% IV SOLN
INTRAVENOUS | Status: DC
  Filled 2024-04-03 (×7): qty 1000

## 2024-04-03 NOTE — Progress Notes (Signed)
 Bed changed and patient  washed and turned.

## 2024-04-03 NOTE — Plan of Care (Signed)

## 2024-04-03 NOTE — Progress Notes (Signed)
 Speech Language Pathology Treatment: Dysphagia  Patient Details Name: Thomas [name] MRN: [id number] DOB: December 12, 1928 Today's Date: 04/03/2024 Time: 8562-8480 SLP Time Calculation (min) (ACUTE ONLY): 42 min  Assessment / Plan / Recommendation Clinical Impression  Pt was seen this afternoon after pt and family met with MD and palliative care PA. They preferred starting a modified PO diet, acknowledging that there is risk associated with PO intake, but hoping to reduce risk as much as possible. These are the two consistencies that were not overtly aspirated on MBS. Pt's first tray had arrived and he was eager for POs even with modified textures. SLP assisted with feeding, also giving Min-Mod cues for swallowing strategies including pacing, limiting bolus size, and clearing oral cavity between bites. Pt's wife and son were present and actively engaged in education during feeding, asking questions in preparation for anticipated transition home with caregivers. His son, [name], asked about thickening liquids and if thin liquids could be given sometimes. We discussed pros and cons of both thickeners and thin liquids in this situation, and that this needed to be considered within overall GOC. SLP provided information about the importance of oral hygiene either way they may choose, both for comfort and for considering potential adverse events in the setting of aspiration. All questions were answered at this time. Pt had a little coughing after POs, but then closed his eyes to rest, so SLP left him in an upright position. Will continue to follow and provide support.    HPI HPI: Pt is a 89 y.o. presents today with AHRF iso aspiration event. History provided by wife and son at the bedside. Per their report, they were called early this morning around 0200 when the pt was evaluated by staff who was c/f aspiration given increased O2 requierment and difficulty breathing. CXR (10/4) revealed, No pleural effusion, acute  consolidation of lung, or pneumothorax. No acute to suggest the patient's hypoxia. pt on puree/NTL at SNF, they also note possible instrumental assessment completed. PMH: type 2 diabetes, hypertension, hypothyroidism, paroxysmal A-fib on Eliquis , sick sinus syndrome status post pacemaker placement, and recent admission on  for acute CVA (left thalamic infarct and Small acute to subacute left frontal lobe infarcts).      SLP Plan  Continue with current plan of care          Recommendations  Diet recommendations: Dysphagia 1 (puree);Honey-thick liquid Liquids provided via: Cup;Straw Medication Administration: Crushed with puree Supervision: Staff to assist with self feeding;Full supervision/cueing for compensatory strategies Compensations: Minimize environmental distractions;Slow rate;Small sips/bites;Multiple dry swallows after each bite/sip Postural Changes and/or Swallow Maneuvers: Seated upright 90 degrees;Upright 30-60 min after meal                  Oral care BID     Dysphagia, oropharyngeal phase (R13.12)     Continue with current plan of care     Leita SAILOR., M.A. CCC-SLP Acute Rehabilitation Services Office: [phone]  Secure chat preferred   04/03/2024, 3:28 PM

## 2024-04-03 NOTE — TOC Initial Note (Addendum)
 Transition of Care (TOC) - Initial/Assessment Note   Spoke to patient, wife Reena and son [name] at bedside. Patient was nonverbal for visit.   Discuss referral for home with hospice. Confined they are in agreement.   NCM confined face sheet information.   NCM provided medicare.gov list of home hospice agencies. [name] and Reena have NCM direct cell number to call when they have made a decision on which agency they would like.   [name]  [phone] will be contact person for hospice agency.   Family aware hospice will be in home for support but not daily or for long periods of time. Personal care is up to family to provide. Family asking for a list of PCW agencies . NCM does not have a list but did internet search and provided some booklets .   Patient has a hospital bed, over bed table, lift recliner , two transport chairs, walker and bedside commode.   If any other equipment needed hospice agency will order .   Await family's decision on hospice agency .   Patient's other son will be in town tomorrow.   Will need ambulance transport home.    Patient Details  Name: Thomas [name] MRN: [id number] Date of Birth: 06/01/1929  Transition of Care [facility]) CM/SW Contact:    Stephane [name], RN Phone Number: 04/03/2024, 3:49 PM  Clinical Narrative:                   Expected Discharge Plan: Home w Hospice Care Barriers to Discharge: Continued Medical Work up   Patient Goals and CMS Choice Patient states their goals for this hospitalization and ongoing recovery are:: hom ewith hospice CMS Medicare.gov Compare Post Acute Care list provided to:: Patient Represenative (must comment) (wife Reena, son [name]) Choice offered to / list presented to : Spouse, Adult Children      Expected Discharge Plan and Services In-house Referral: Clinical Social Work Discharge Planning Services: CM Consult Post Acute Care Choice: Hospice Living arrangements for the past 2 months: Single  Family Home                 DME Arranged:  (see note)                    Prior Living Arrangements/Services Living arrangements for the past 2 months: Single Family Home Lives with:: Spouse Patient language and need for interpreter reviewed:: Yes Do you feel safe going back to the place where you live?: Yes      Need for Family Participation in Patient Care: Yes (Comment) Care giver support system in place?: Yes (comment) Current home services: DME Criminal Activity/Legal Involvement Pertinent to Current Situation/Hospitalization: No - Comment as needed  Activities of Daily Living   ADL Screening (condition at time of admission) Independently performs ADLs?: No Does the patient have a NEW difficulty with bathing/dressing/toileting/self-feeding that is expected to last >3 days?: Yes (Initiates electronic notice to provider for possible OT consult) Does the patient have a NEW difficulty with getting in/out of bed, walking, or climbing stairs that is expected to last >3 days?: Yes (Initiates electronic notice to provider for possible PT consult) Does the patient have a NEW difficulty with communication that is expected to last >3 days?: No Is the patient deaf or have difficulty hearing?: Yes Does the patient have difficulty seeing, even when wearing glasses/contacts?: No Does the patient have difficulty concentrating, remembering, or making decisions?: Yes  Permission Sought/Granted Permission  sought to share information with : Facility Medical sales representative, Family Supports Permission granted to share information with : Yes, Verbal Permission Granted  Share Information with NAME: wife Reena Gilmer Anes, son Selinda  Permission granted to share info w AGENCY: [employer]  Permission granted to share info w Relationship: Spouse  Permission granted to share info w Contact Information: [phone]  Emotional Assessment Appearance:: Appears stated  age Attitude/Demeanor/Rapport: Unable to Assess Affect (typically observed): Unable to Assess Orientation: : Oriented to Self Alcohol / Substance Use: Not Applicable Psych Involvement: No (comment)  Admission diagnosis:  Aspiration pneumonia ([name]) [J69.0] Hyperglycemia [R73.9] Hypoxia [R09.02] Sepsis, due to unspecified organism, unspecified whether acute organ dysfunction present [facility]) [A41.9] Patient Active Problem List   Diagnosis Date Noted   Malnutrition of moderate degree 04/02/2024   Aspiration pneumonia ([name]) 03/31/2024   Thoracic ascending aortic aneurysm 03/26/2024   DNR (do not resuscitate) 03/21/2024   PAF (paroxysmal atrial fibrillation) ([name]) 03/21/2024   History of pulmonary embolism 03/21/2024   Actinic keratosis 02/06/2024   Seborrheic dermatitis 02/06/2024   Heel callus 02/14/2023   Onychomycosis 07/30/2022   Constipation 07/30/2022   Edema of foot 04/22/2022   Clotting disorder 03/16/2021   Orthostatic hypotension 10/01/2020   Normocytic anemia 08/31/2020   Prolonged QT interval 08/31/2020   Acute CVA (cerebrovascular accident) ([name]) 08/31/2020   Debility 08/21/2020   Lytic bone lesion of femur 08/21/2020   Sick sinus syndrome ([name]) 07/31/2020   Coronary artery disease s/p CABG 2001 07/17/2020   Acquired hypothyroidism 07/17/2020   Hypertension associated with diabetes ([name]) 07/17/2020   Cardiac pacemaker - PPM and leads are MRI compatible. 07/17/2020   Osteoarthritis 07/17/2020   Type 2 diabetes mellitus with hyperglycemia ([name]) 07/17/2020   Vitamin D  deficiency 07/17/2020   Macular degeneration 07/17/2020   Dyslipidemia due to type 2 diabetes mellitus ([name]) 07/17/2020   Insomnia 07/17/2020   BPH (benign prostatic hyperplasia) 07/17/2020   Colon polyps 07/17/2020   PCP:  Kennyth Worth HERO, MD Pharmacy:   [facility] [phone] - [place], [place] - [address] [place]  [address] Phone: [phone] Fax: [phone]     Social Drivers of Health (SDOH) Social History: SDOH Screenings   Food Insecurity: No Food Insecurity (03/31/2024)  Housing: Low Risk  (03/31/2024)  Transportation Needs: No Transportation Needs (03/31/2024)  Utilities: Not At Risk (03/31/2024)  Alcohol Screen: Low Risk  (10/10/2023)  Depression (PHQ2-9): Low Risk  (02/06/2024)  Financial Resource Strain: Low Risk  (10/10/2023)  Physical Activity: Insufficiently Active (10/10/2023)  Social Connections: Unknown (03/31/2024)  Stress: No Stress Concern Present (10/10/2023)  Tobacco Use: Medium Risk (03/31/2024)  Health Literacy: Adequate Health Literacy (10/10/2023)   SDOH Interventions:     Readmission Risk Interventions     No data to display

## 2024-04-03 NOTE — Progress Notes (Signed)
 Daily Progress Note   [name]       Date: 04/03/2024 DOB: 06-Jan-1929  Age: 89 y.o. MRN#: [id number] Attending Physician: Jonel Lonni SQUIBB, * Primary Care Physician: Kennyth Worth HERO, MD Admit Date: 03/31/2024  Reason for Consultation/Follow-up: Establishing goals of care  Subjective: Medical records reviewed including progress notes, labs, imaging. Patient assessed at the bedside. Discussed with RN.  Patient's wife and son are present at bedside for scheduled family meeting with myself and Dr. Jonel.  Created space and opportunity for patient and family's thoughts and feelings on patient's current illness.  They have been considering how to best support patient at home given that he has not been doing well with his rehab at [facility].  Their main questions are how to provide his personal care needs, what resources could assist them with this, what type of diet would be best for him, and the timeline of expected prognosis.  Explored patient's wishes on potential hospitalizations after return home, given that it is expected he will continue to have recurrent aspiration/dehydration sequelae.  They are emotional as they share that patient likely would not want to come back to the hospital, especially for interventions that are not long-term solutions.  He did not really want to come in the first place when he had his stroke.  Given the goal of avoiding hospitalizations, I recommended hospice support in conjunction with primary caregivers to ensure he is as comfortable as possible in his preferred environment for however long he naturally may have.  Patient's wife and son are agreeable to a referral being initiated.  They would appreciate any resources for private caregivers as well.  We briefly discussed the option of comfort focused  care liberalizing his diet while in hospital as well.  They will be here tomorrow morning and appreciate a visit for continued support and discussions.  Questions and concerns addressed. PMT will continue to support holistically.   Length of Stay: 3   Physical Exam Vitals and nursing note reviewed.  Constitutional:      General: He is not in acute distress.    Appearance: He is ill-appearing.  Cardiovascular:     Rate and Rhythm: Normal rate.  Pulmonary:     Effort: Pulmonary effort is normal.  Skin:    General: Skin is warm and dry.  Neurological:     Mental Status: He is lethargic.  Psychiatric:        Cognition and Memory: Cognition is impaired.            Vital Signs: BP (!) 144/56 (BP Location: Left Arm)  Pulse 70   Temp 99 F (37.2 C) (Axillary)   Resp 16   SpO2 98%  SpO2: SpO2: 98 % O2 Device: O2 Device: Room Air O2 Flow Rate:        Palliative Assessment/Data: 20%   Palliative Care Assessment & Plan   Patient Profile: 89 y.o. male  with past medical history of type 2 diabetes, hypertension, hypothyroidism, paroxysmal A-fib on Eliquis , sick sinus syndrome status post pacemaker placement, and recent admission on  for acute CVA (left thalamic infarct and Small acute to subacute left frontal lobe infarcts)  admitted on 03/31/2024 with dyspnea at SNF.    Of note, since CVA on 9/23, pt has been bed bound with intermittent lethargy and able to tol PO infrequently. In the ED, pt febrile 100.9, hypertensive, and tachypneic W/O hypoxia. Labs notable for na 148, glucose 606, BNP 229, lactic acid 2.4-->1.9, and WBC 16.4.  He was admitted with acute hypoxic respiratory failure and sepsis due to aspiration pneumonia, AKI and failure to thrive.  MBS on 10/6 went poorly, patient now n.p.o. PMT has been consulted to assist with goals of care conversation, anticipatory care needs, complex medical decision making.  Assessment: Goals of care discussion Recent stroke with subsequent  dysphagia Sepsis and acute respiratory failure with hypoxia, secondary to aspiration pneumonia Acute metabolic encephalopathy Hyponatremia AKI on CKD 3 AA, resolved Failure to thrive  Recommendations/Plan: Continue DNR/DNI Continue current care plan Goal is to return patient home with hospice support and primary caregivers.  Appreciate TOC assistance with referral Psychosocial and emotional support provided PMT will continue to follow and support   Prognosis: Poor prognosis given rapid decline in functional/nutritional/cognitive status secondary to acute stroke, hospice appropriate  Discharge Planning: Home with Hospice  Care plan was discussed with patient, patient's wife, patient's son, primary attending, RN, TOC    Haniah Penny SHAUNNA Fell, PA-C  Palliative Medicine Team Team phone # [phone]  Thank you for allowing the Palliative Medicine Team to assist in the care of this patient. Please utilize secure chat with additional questions, if there is no response within 30 minutes please call the above phone number.  Palliative Medicine Team providers are available by phone from 7am to 7pm daily and can be reached through the team cell phone.  Should this patient require assistance outside of these hours, please call the patient's attending physician.   MDM: High  Problems Addressed: One acute or chronic illness or injury that poses a threat to life or bodily function  Amount and/or Complexity of Data: Category 1:Review of prior external note(s) from each unique source, Review of the result(s) of each unique test, and Assessment requiring an independent historian(s), Category 2:Independent interpretation of a test performed by another physician/other qualified health care professional (not separately reported), and Category 3:Discussion of management or test interpretation with external physician/other qualified health care professional/appropriate source (not separately  reported)  Risks: Decision not to resuscitate or to de-escalate care because of poor prognosis

## 2024-04-03 NOTE — Progress Notes (Signed)
 Physical Therapy Treatment Patient Details Name: Thomas [name] MRN: [id number] DOB: October 04, 1928 Today's Date: 04/03/2024   History of Present Illness Thomas [name] is a 89 y.o. male admitted on 03/31/24 from SNF for acute hypoxic respiratory failure secondary to aspiration pneumonia. 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    PT Comments  Pt resting in bed on arrival, agreeable to session and demonstrating slow but steady progress towards acute goals. Pt with improved participation and initiation this session, able to begin to bring Les toward L EOB, however requiring max A to complete. Pt with poor sitting balance with posterior and R lateral lean needing verbal and tactile cues to correct and up to mod A to maintain sitting balance. Pt performing dynamic reaching to encourage anterior weight shift in sitting with fair carryover briefly. Attempting standing x2 however pt with poor/zero power through [place] to transfer. Patient will benefit from continued inpatient follow up therapy, <3 hours/day, will continue to follow acutely.    If plan is discharge home, recommend the following: Assistance with cooking/housework;Help with stairs or ramp for entrance;Assist for transportation;Supervision due to cognitive status;A lot of help with bathing/dressing/bathroom;Two people to help with walking and/or transfers;Assistance with feeding   Can travel by private vehicle     No  Equipment Recommendations  None recommended by PT;Other (comment) (defer to next level of care)    Recommendations for Other Services       Precautions / Restrictions Precautions Precautions: Fall Recall of Precautions/Restrictions: Impaired Restrictions Weight Bearing Restrictions Per Provider Order: No      Mobility  Bed Mobility Overal bed mobility: Needs Assistance Bed Mobility: Supine to Sit, Sit to Supine     Supine to sit: HOB elevated, Max assist Sit to supine: Max assist   General bed mobility comments: pt able to initiate LEs towards EOB needing max A to complete, max A for all aspects to return to supine    Transfers Overall transfer level: Needs assistance Equipment used: Rolling walker (2 wheels) Transfers: Sit to/from Stand, Bed to chair/wheelchair/BSC Sit to Stand: Total assist           General transfer comment: x2 attempts with pt giving little to no effort through LEs to come to stand    Ambulation/Gait                   Stairs             Wheelchair Mobility     Tilt Bed    Modified Rankin (Stroke Patients Only) Modified Rankin (Stroke Patients Only) Pre-Morbid Rankin Score: Moderate disability Modified Rankin: Severe disability     Balance Overall balance assessment: Needs assistance Sitting-balance support: Bilateral upper extremity supported, Feet supported Sitting balance-Leahy Scale: Poor Sitting balance - Comments: Pt initially maxA w/ posterior lean, with time pt progressed to CGA briefly able to self correct at times.Tolerated  ~10 minutes at EOB, worked on anterior weight shift with functional mobility pattern with hands on knees and down shin to socks Postural control: Posterior lean                                  Communication Communication Communication: Impaired Factors Affecting Communication: Hearing impaired  Cognition Arousal: Alert Behavior During Therapy:  Flat affect   PT - Cognitive impairments: Sequencing, Problem solving, Safety/Judgement, Memory, Awareness, Orientation, No family/caregiver present to determine baseline                         Following commands: Impaired Following commands impaired: Follows one step commands with increased time    Cueing Cueing Techniques:  Verbal cues, Gestural cues, Tactile cues  Exercises General Exercises - Lower Extremity Long Arc Quad: AROM, Right, Left, 10 reps, Seated Hip Flexion/Marching: AROM, Right, Left, 20 reps, Seated, Standing    General Comments        Pertinent Vitals/Pain Pain Assessment Pain Assessment: Faces Faces Pain Scale: Hurts a little bit Pain Location: generalized Pain Descriptors / Indicators: Grimacing, Guarding Pain Intervention(s): Monitored during session, Limited activity within patient's tolerance    Home Living                          Prior Function            PT Goals (current goals can now be found in the care plan section) Acute Rehab PT Goals Patient Stated Goal: unable to state PT Goal Formulation: Patient unable to participate in goal setting Time For Goal Achievement: 04/15/24 Progress towards PT goals: Progressing toward goals    Frequency    Min 2X/week      PT Plan      Co-evaluation              AM-PAC PT 6 Clicks Mobility   Outcome Measure  Help needed turning from your back to your side while in a flat bed without using bedrails?: A Lot Help needed moving from lying on your back to sitting on the side of a flat bed without using bedrails?: A Lot Help needed moving to and from a bed to a chair (including a wheelchair)?: Total Help needed standing up from a chair using your arms (e.g., wheelchair or bedside chair)?: Total Help needed to walk in hospital room?: Total Help needed climbing 3-5 steps with a railing? : Total 6 Click Score: 8    End of Session Equipment Utilized During Treatment: Gait belt Activity Tolerance: Patient limited by fatigue Patient left: with call bell/phone within reach;in bed;with bed alarm set Nurse Communication: Mobility status PT Visit Diagnosis: Muscle weakness (generalized) (M62.81);Other abnormalities of gait and mobility (R26.89)     Time: 8970-8947 PT Time Calculation (min) (ACUTE ONLY): 23  min  Charges:    $Therapeutic Activity: 23-37 mins PT General Charges $$ ACUTE PT VISIT: 1 Visit                     Dorsel Flinn R. PTA Acute Rehabilitation Services Office: [phone]   Therisa CHRISTELLA Boor 04/03/2024, 3:25 PM

## 2024-04-03 NOTE — Progress Notes (Signed)
 Progress Note   Patient: Thomas [name] FMW:[id number] DOB: Feb 28, 1929 DOA: 03/31/2024     3 DOS: the patient was seen and examined on 04/03/2024 at 11:50AM and 2:00PM      Brief hospital course: 89 y.o. M with pAF and SSS s/p PPM, on Eliquis , recent stroke, DM, HTN, and hypothyroidism admitted from rehab with hypoxia and decreased responsiveness after an aspiration event.        Assessment and Plan: Sepsis due to aspiration pneumonia Acute respiratory failure with hypoxia Presented with fever, leukocytosis, renal failure, respiratory failure and decreased mentation in setting of aspiration after recent stroke.   Evaluated by SLP and recommended puree, honey thick. In addition to weakness, recent stroke, he has cervical spine osteophytes and postural issues that limit epiglottic action.  Aspiration recurrence risk is high under any circumstances. - Continue Rocephin, day 4 of 5 - Continue RT     Hypernatremia Acute renal failure On readmission, Na 151, Cr 2.0 (from baseline 1.1).   Treated with 1/2 NS due to hyperglycemia and Cr resolved. Unfortunately, sodium remains very high.  I feel I cannot safely use D5W due to severe hyperglycemia in the last 48 hours. - Continue 1/2 NS - Follow BMP   Dysphagia Failure to thrive Prior to stroke last month, was living with wife, able to transfer with walker and had limited but reasonably good quality of life.  After stroke, was able to stand and transfer prior to discharge to rehab.  Na, Cr were normal, alertness was worse than previous baseline but awake most of the day.  At rehab, he became somnolent, even on first day of therapy, could not stand even with 2+ assist.  After 5 days, readmitted with hypernatremia, AKI and aspiration pneumonia  MBS 10/6 showed substantial aspiration of thin and nectar thick liquids.     Palliative consulted, and we had extensive conversations with family.  Discussed fluid consistency and competing  risks of aspiration or dehydration.  Family point out that he did not perform well with PT in rehab, and they have asked about going home.   - Dysphagia 1 with honey thick for now - Consult Hospice for discharge to home - SLP consult appreciated - Defer food consistency to family at discharge, given comfort level - Family thinking about readmission goals - Given overall picture, I have concerns things will progress quickly          Acute metabolic encephalopathy At baseline prior to stroke, the patient had no significant memory loss but was [facility] and physically limited.  Since stroke, has had waxing and waning disorientation.  On arrival he was poorly responsive due to sepsis, now resolved.    - Delirium precautions   Anemia Presumably from chronic kidney disease, no bleeding reported or observed - Trend hemoglobin   AKI on CKD IIIa Creatinine up to 2 in the setting of sepsis, now improved to baseline. - Continue IV fluids - Avoid nephrotoxins, hypotension - Hold Lasix  for now   Recent stroke Cerebrovascular disease Hypertension Admitted recently for left thalamic infarct and small left frontal lobe infarct, likely due to commendation large and small vessel disease. Discharged on new aspirin  combined with Eliquis   -Continue aspirin , apixaban , rosuvastatin  - Hold furosemide    Diabetes with hyperglycemia Patient had glucose 500 on admission.  Remained elevated until he was made NPO on 10/6 - Continue glargine - Continue SS corrections, resistant     Hypothyroidism - Continue levothyroxine    Paroxysmal atrial fibrillation Sick sinus syndrome  Pacemaker in place Heart rate controlled -Continue amiodarone , apixaban , carvedilol    History of orthostasis Uses midodrine  at baseline, BP here elevated - Stop midodrine    BPH Flomax  cannot be crushed - Transition to finasteride  Hypokalemia - Add K to fluids         Subjective: Family report his alertness is better.   No fever, no respiraotry symptoms.  No pain complaints.  Palilative meeting today, family expressed wish to discharge home with Hospice.     Physical Exam: BP (!) 150/56 (BP Location: Left Arm)   Pulse 69   Temp 98 F (36.7 C)   Resp 18   SpO2 98%   Frail super elderly adult male, lying in bed, makes eye contact briefly, then stares ahead, very hard of hearing and mostly does not engage, appears quite sleepy RRR, no murmurs, no peripheral edema Respiratory rate normal, lung sounds very diminished, no rales or wheezes appreciated Abdomen soft, no tenderness palpation or guarding, no ascites or distention Psychomotor slowing is moderate, face is symmetric, I feel he has right-sided weakness, relative to the left, although he is so generally weak that it is difficult to tell the difference, oriented to self, family, hospital    Data Reviewed: Discussed with palliative care Basic metabolic panel shows persistent hyponatremia, hypokalemia, renal function down to 1.1 CBC shows persistent leukocytosis, worsening anemia    Family Communication: wife and son at bedside    Disposition: Status is: Inpatient 89 yo M with recent stroke, readmitted from SNF rehab for erspiratory distress after suspected aspiration, as well as hypernatremia and AKI  Overall, difficult situation.  Will plan for 2-3 more days IV fluids to attempt to correct Na (limited by hyperglycemia).  Family will work to obtain personal care assistants at home, and referral to hospice has been made.        Author: Lonni SHAUNNA Dalton, MD 04/03/2024 4:38 PM  For on call review [email or web address].

## 2024-04-03 NOTE — Plan of Care (Signed)
  Problem: Health Behavior/Discharge Planning: Goal: Ability to manage health-related needs will improve Outcome: Not Progressing   Problem: Activity: Goal: Risk for activity intolerance will decrease Outcome: Not Progressing   Problem: Nutrition: Goal: Adequate nutrition will be maintained Outcome: Not Progressing   Problem: Coping: Goal: Level of anxiety will decrease Outcome: Not Progressing

## 2024-04-03 NOTE — Care Management Important Message (Signed)
 Important Message  Patient Details  Name: Thomas [name] MRN: [id number] Date of Birth: 08-31-1928   Important Message Given:  Yes - Medicare IM     Jon Cruel 04/03/2024, 3:12 PM

## 2024-04-04 DIAGNOSIS — Z66 Do not resuscitate: Secondary | ICD-10-CM

## 2024-04-04 DIAGNOSIS — Z789 Other specified health status: Secondary | ICD-10-CM

## 2024-04-04 DIAGNOSIS — J690 Pneumonitis due to inhalation of food and vomit: Secondary | ICD-10-CM | POA: Diagnosis not present

## 2024-04-04 LAB — COMPREHENSIVE METABOLIC PANEL WITH GFR
ALT: 45 U/L — ABNORMAL HIGH (ref 0–44)
AST: 35 U/L (ref 15–41)
Albumin: 2.1 g/dL — ABNORMAL LOW (ref 3.5–5.0)
Alkaline Phosphatase: 49 U/L (ref 38–126)
Anion gap: 11 (ref 5–15)
BUN: 28 mg/dL — ABNORMAL HIGH (ref 8–23)
CO2: 20 mmol/L — ABNORMAL LOW (ref 22–32)
Calcium: 7.8 mg/dL — ABNORMAL LOW (ref 8.9–10.3)
Chloride: 114 mmol/L — ABNORMAL HIGH (ref 98–111)
Creatinine, Ser: 1.14 mg/dL (ref 0.61–1.24)
GFR, Estimated: 60 mL/min — ABNORMAL LOW (ref >60)
Glucose, Bld: 259 mg/dL — ABNORMAL HIGH (ref 70–99)
Potassium: 4.1 mmol/L (ref 3.5–5.1)
Sodium: 145 mmol/L (ref 135–145)
Total Bilirubin: 0.4 mg/dL (ref 0.0–1.2)
Total Protein: 5.6 g/dL — ABNORMAL LOW (ref 6.5–8.1)

## 2024-04-04 LAB — CBC
HCT: 28.7 % — ABNORMAL LOW (ref 39.0–52.0)
Hemoglobin: 9.2 g/dL — ABNORMAL LOW (ref 13.0–17.0)
MCH: 33 pg (ref 26.0–34.0)
MCHC: 32.1 g/dL (ref 30.0–36.0)
MCV: 102.9 fL — ABNORMAL HIGH (ref 80.0–100.0)
Platelets: 195 K/uL (ref 150–400)
RBC: 2.79 MIL/uL — ABNORMAL LOW (ref 4.22–5.81)
RDW: 12.3 % (ref 11.5–15.5)
WBC: 14.3 K/uL — ABNORMAL HIGH (ref 4.0–10.5)
nRBC: 0 % (ref 0.0–0.2)

## 2024-04-04 LAB — GLUCOSE, CAPILLARY
Glucose-Capillary: 234 mg/dL — ABNORMAL HIGH (ref 70–99)
Glucose-Capillary: 358 mg/dL — ABNORMAL HIGH (ref 70–99)
Glucose-Capillary: 225 mg/dL — ABNORMAL HIGH (ref 70–99)
Glucose-Capillary: 156 mg/dL — ABNORMAL HIGH (ref 70–99)
Glucose-Capillary: 324 mg/dL — ABNORMAL HIGH (ref 70–99)

## 2024-04-04 NOTE — Progress Notes (Signed)
 PROGRESS NOTE    Thomas [name]  FMW:[id number] DOB: [birth year] DOA: 03/31/2024 PCP: Kennyth Worth HERO, MD   Brief Narrative:  This 89 y.o. Male with pAF and SSS s/p PPM, on Eliquis , recent stroke, DM, HTN, and hypothyroidism admitted from rehab with hypoxia and decreased responsiveness after an aspiration event.   Assessment & Plan:   Principal Problem:   Aspiration pneumonia ([name]) Active Problems:   Malnutrition of moderate degree   Sepsis due to aspiration pneumonia: Acute hypoxic respiratory failure: He presented with fever, leukocytosis, renal failure, respiratory failure and decreased mentation in setting of aspiration after recent stroke. Evaluated by SLP and recommended puree, honey thick. In addition to weakness, recent stroke, he has cervical spine osteophytes and postural issues that limit epiglottic action.   -Aspiration recurrence risk is high under any circumstances. - Continue Rocephin, day 4 of 5 - Continue RT   Hypernatremia > Improved. Acute renal failure: > Resolved.  Dysphagia: Failure to thrive: Prior to stroke last month, He was living with wife, able to transfer with walker and had limited but reasonably good quality of life.  After stroke, He was able to stand and transfer prior to discharge to rehab.  Na, Cr were normal, alertness was worse than previous baseline but awake most of the day. -At rehab, he became somnolent, even on first day of therapy, could not stand even with 2+ assist.  After 5 days, readmitted with hypernatremia, AKI and aspiration pneumonia. -MBS 10/6 showed substantial aspiration of thin and nectar thick liquids.    - Palliative consulted, and we had extensive conversations with family.  Discussed fluid consistency and competing risks of aspiration or dehydration.  Family point out that he did not perform well with PT in rehab, and they have asked about [name] home.   - Dysphagia 1 with honey thick for now. - Consult Hospice for discharge  to home. - SLP consult appreciated. - Defer food consistency to family at discharge, given comfort level. - Family thinking about readmission goals. - Given overall picture, I have concerns things will progress quickly    Acute metabolic encephalopathy: At baseline prior to stroke, the patient had no significant memory loss but was [facility] and physically limited.  Since stroke, has had waxing and waning disorientation.  On arrival he was poorly responsive due to sepsis, now resolved.    - Delirium precautions.   Anemia: Presumably from chronic kidney disease, no bleeding reported or observed - Trend hemoglobin   AKI on CKD IIIa: Creatinine up to 2 in the setting of sepsis, now improved to baseline. - Continue IV fluids - Hold Lasix  for now   Recent stroke: Cerebrovascular disease Hypertension Admitted recently for left thalamic infarct and small left frontal lobe infarct, likely due to large and small vessel disease. Discharged on new aspirin  combined with Eliquis  . -Continue aspirin , apixaban , rosuvastatin  - Hold furosemide    Diabetes with hyperglycemia: Patient had glucose 500 on admission.  Remained elevated until he was made NPO on 10/6 - Continue glargine, - Continue SS corrections, resistant     Hypothyroidism: - Continue levothyroxine    Paroxysmal atrial fibrillation Sick sinus syndrome Pacemaker in place Heart rate controlled -Continue amiodarone , apixaban , carvedilol    History of orthostasis: Uses midodrine  at baseline, BP here elevated. - Stop midodrine    BPH: Flomax  cannot be crushed - Transition to finasteride   Hypokalemia: - Add K to fluids. Resolved.   DVT prophylaxis:Eliquis  Code Status: DNR Family Communication: No family at bed side. Disposition Plan:  Status is: Inpatient Remains inpatient appropriate because: Severity of illness.    Consultants:  None  Procedures:  Antimicrobials: Anti-infectives (From admission, onward)    Start      Dose/Rate Route Frequency Ordered Stop   04/01/24 1330  cefTRIAXone (ROCEPHIN) 2 g in sodium chloride  0.9 % 100 mL IVPB        2 g 200 mL/hr over 30 Minutes Intravenous Every 24 hours 04/01/24 1239 04/07/24 1329   03/31/24 1045  cefTRIAXone (ROCEPHIN) 1 g in sodium chloride  0.9 % 100 mL IVPB        1 g 200 mL/hr over 30 Minutes Intravenous  Once 03/31/24 1033 03/31/24 1122   03/31/24 1045  azithromycin (ZITHROMAX) 500 mg in sodium chloride  0.9 % 250 mL IVPB        500 mg 250 mL/hr over 60 Minutes Intravenous  Once 03/31/24 1033 03/31/24 1225      Subjective: Patient was seen and examined at bedside.  Overnight events noted. Patient does not talk much but alert and following commands,  He is from SNF.  Sodium has improved.  Objective: Vitals:   04/03/24 2221 04/04/24 0624 04/04/24 1012 04/04/24 1028  BP: (!) 118/48 (!) 137/47 (!) 137/47 (!) 142/57  Pulse: 70 69 69 71  Resp: 18 18 18 16   Temp: 98.7 F (37.1 C) 98.6 F (37 C) 98.6 F (37 C) 99.2 F (37.3 C)  TempSrc: Oral Axillary Axillary Oral  SpO2: 97% 97%  95%  Weight:   79.2 kg   Height:   5' 8 (1.727 m)     Intake/Output Summary (Last 24 hours) at 04/04/2024 1309 Last data filed at 04/04/2024 0641 Gross per 24 hour  Intake 165.77 ml  Output 1250 ml  Net -1084.23 ml   Filed Weights   04/04/24 1012  Weight: 79.2 kg    Examination:  General exam: Appears calm and comfortable, deconditioned, not in any acute distress. Respiratory system: Clear to auscultation. Respiratory effort normal.  RR 14 Cardiovascular system: S1 & S2 heard, RRR. No JVD, murmurs, rubs, gallops or clicks.  Gastrointestinal system: Abdomen is nondistended, soft and nontender.  Normal bowel sounds heard. Central nervous system: Alert and oriented x 1. No focal neurological deficits. Extremities: No edema, no cyanosis, no clubbing. Skin: No rashes, lesions or ulcers Psychiatry: Judgement and insight appear normal. Mood & affect appropriate.      Data Reviewed: I have personally reviewed following labs and imaging studies  CBC: Recent Labs  Lab 03/31/24 0733 03/31/24 0814 04/02/24 1106 04/03/24 0603 04/04/24 0431  WBC 16.4*  --  14.3* 14.2* 14.3*  NEUTROABS 12.1*  --   --   --   --   HGB 12.4* 12.6* 10.4* 8.7* 9.2*  HCT 39.4 37.0* 33.5* 27.2* 28.7*  MCV 104.5*  --  106.3* 105.0* 102.9*  PLT 269  --  229 179 195   Basic Metabolic Panel: Recent Labs  Lab 03/31/24 0733 03/31/24 0814 03/31/24 2053 03/31/24 2054 04/02/24 1106 04/03/24 0603 04/04/24 0431  NA 148* 149* 150*  --  151* 149* 145  K 4.7 4.7 3.7  --  3.8 3.4* 4.1  CL 108  --  116*  --  118* 116* 114*  CO2 25  --  22  --  24 24 20*  GLUCOSE 606*  --  561* 559* 456* 130* 259*  BUN 50*  --  52*  --  49* 31* 28*  CREATININE 2.02*  --  1.64*  --  1.52*  1.15 1.14  CALCIUM  9.2  --  8.5*  --  8.2* 7.9* 7.8*  PHOS  --   --   --   --   --  3.3  --    GFR: Estimated Creatinine Clearance: 39.2 mL/min (by C-G formula based on SCr of 1.14 mg/dL). Liver Function Tests: Recent Labs  Lab 04/03/24 0603 04/04/24 0431  AST  --  35  ALT  --  45*  ALKPHOS  --  49  BILITOT  --  0.4  PROT  --  5.6*  ALBUMIN 2.2* 2.1*   No results for input(s): LIPASE, AMYLASE in the last 168 hours. No results for input(s): AMMONIA in the last 168 hours. Coagulation Profile: No results for input(s): INR, PROTIME in the last 168 hours. Cardiac Enzymes: No results for input(s): CKTOTAL, CKMB, CKMBINDEX, TROPONINI in the last 168 hours. BNP (last 3 results) No results for input(s): PROBNP in the last 8760 hours. HbA1C: No results for input(s): HGBA1C in the last 72 hours. CBG: Recent Labs  Lab 04/03/24 1205 04/03/24 1715 04/03/24 2118 04/04/24 0801 04/04/24 1200  GLUCAP 164* 314* 345* 234* 358*   Lipid Profile: No results for input(s): CHOL, HDL, LDLCALC, TRIG, CHOLHDL, LDLDIRECT in the last 72 hours. Thyroid  Function Tests: No  results for input(s): TSH, T4TOTAL, FREET4, T3FREE, THYROIDAB in the last 72 hours. Anemia Panel: No results for input(s): VITAMINB12, FOLATE, FERRITIN, TIBC, IRON, RETICCTPCT in the last 72 hours. Sepsis Labs: Recent Labs  Lab 03/31/24 0814 03/31/24 1041  LATICACIDVEN 2.4* 1.9    Recent Results (from the past 240 hours)  Resp panel by RT-PCR (RSV, Flu A&B, Covid) Anterior Nasal Swab     Status: None   Collection Time: 03/31/24  7:33 AM   Specimen: Anterior Nasal Swab  Result Value Ref Range Status   SARS Coronavirus 2 by RT PCR NEGATIVE NEGATIVE Final   Influenza A by PCR NEGATIVE NEGATIVE Final   Influenza B by PCR NEGATIVE NEGATIVE Final    Comment: (NOTE) The Xpert Xpress SARS-CoV-2/FLU/RSV plus assay is intended as an aid in the diagnosis of influenza from Nasopharyngeal swab specimens and should not be used as a sole basis for treatment. Nasal washings and aspirates are unacceptable for Xpert Xpress SARS-CoV-2/FLU/RSV testing.  Fact Sheet for Patients: BloggerCourse.com  Fact Sheet for Healthcare Providers: SeriousBroker.it  This test is not yet approved or cleared by the [place]  FDA and has been authorized for detection and/or diagnosis of SARS-CoV-2 by FDA under an Emergency Use Authorization (EUA). This EUA will remain in effect (meaning this test can be used) for the duration of the COVID-19 declaration under Section 564(b)(1) of the Act, 21 U.S.C. section 360bbb-3(b)(1), unless the authorization is terminated or revoked.     Resp Syncytial Virus by PCR NEGATIVE NEGATIVE Final    Comment: (NOTE) Fact Sheet for Patients: BloggerCourse.com  Fact Sheet for Healthcare Providers: SeriousBroker.it  This test is not yet approved or cleared by the [place]  FDA and has been authorized for detection and/or diagnosis of SARS-CoV-2 by FDA  under an Emergency Use Authorization (EUA). This EUA will remain in effect (meaning this test can be used) for the duration of the COVID-19 declaration under Section 564(b)(1) of the Act, 21 U.S.C. section 360bbb-3(b)(1), unless the authorization is terminated or revoked.  Performed at [facility], [address], [place], [place] [address]   Blood culture (routine x 2)     Status: None (Preliminary result)   Collection Time:  03/31/24  7:56 AM   Specimen: BLOOD RIGHT ARM  Result Value Ref Range Status   Specimen Description BLOOD RIGHT ARM  Final   Special Requests   Final    BOTTLES DRAWN AEROBIC AND ANAEROBIC Blood Culture adequate volume   Culture   Final    NO GROWTH 4 DAYS Performed at [facility], [address], [place], [place] [address]    Report Status PENDING  Incomplete  Blood culture (routine x 2)     Status: None (Preliminary result)   Collection Time: 03/31/24  8:50 AM   Specimen: BLOOD LEFT ARM  Result Value Ref Range Status   Specimen Description BLOOD LEFT ARM  Final   Special Requests   Final    BOTTLES DRAWN AEROBIC AND ANAEROBIC Blood Culture adequate volume   Culture   Final    NO GROWTH 4 DAYS Performed at [facility], [address] [place], [place] [address]    Report Status PENDING  Incomplete    Radiology Studies: No results found.  Scheduled Meds:  amiodarone   50 mg Oral Daily   apixaban   2.5 mg Oral BID   aspirin   81 mg Oral Daily   finasteride  5 mg Oral Daily   insulin  aspart  0-20 Units Subcutaneous TID WC   insulin  aspart  0-5 Units Subcutaneous QHS   insulin  glargine  13 Units Subcutaneous Daily   levothyroxine   112 mcg Oral Q0600   multivitamin with minerals  1 tablet Oral Daily   rosuvastatin   20 mg Oral QHS   Continuous Infusions:  0.45 % NaCl with KCl 20 mEq / L 100 mL/hr at 04/04/24 0431   cefTRIAXone (ROCEPHIN)  IV 2 g (04/03/24 1405)     LOS: 4 days    Time spent: 50 mins    Darcel Dawley,  MD [facility]   If 7PM-7AM, please contact night-coverage

## 2024-04-04 NOTE — Progress Notes (Addendum)
  Nutrition Brief Note  Per GOC with Palliative, pt started on modified po diet with family acknowledging that there is risk associated with PO intake, but hoping to reduce risk as much as possible. On Dysphagia 1, honey thick liquids. Family does want PEG. Home with Hospice.   Will add Magic cups TID with meals. No other nutrition interventions warranted at this time. If nutrition issues arise, please consult RD.   Olivia Kenning, RD Registered Dietitian  See Amion for more information

## 2024-04-04 NOTE — Plan of Care (Signed)
  Problem: Clinical Measurements: Goal: Will remain free from infection Outcome: Progressing   Problem: Nutrition: Goal: Adequate nutrition will be maintained Outcome: Progressing   

## 2024-04-04 NOTE — Progress Notes (Signed)
 Occupational Therapy Treatment Patient Details Name: Thomas [name] MRN: [id number] DOB: 1929/05/31 Today's Date: 04/04/2024   History of present illness [name] is a 89 y.o. male admitted on 03/31/24 from SNF for acute hypoxic respiratory failure secondary to aspiration pneumonia. 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   OT comments  Pt is demonstrating limited progress towards goals this session. Pt engaged in bed mobility with Max A +2 physical assistance. Pt required Max A to maintain sitting balance, demonstrating right posterior lean. Pt guided in grooming tasks seated EOB with Mod A and multimodal cues to initiate and continue sequencing steps. Pt family at bedside educated on personal care support when d/c home with hospice. Family educated on log roll strategy, proper anterior and posterior peri care, home hospital bed functions, and safe body mechanics when caring for Pt. Family verbalized understanding of instruction but will benefit from continued hands-on education to facilitate safe d/c home. OT to continue per acute POC. OT does not recommend follow-up services.       If plan is discharge home, recommend the following:  Two people to help with walking and/or transfers;Two people to help with bathing/dressing/bathroom;Assistance with cooking/housework;Assistance with feeding;Direct supervision/assist for medications management;Direct supervision/assist for financial management;Assist for transportation;Help with stairs or ramp for entrance;Supervision due to cognitive status   Equipment Recommendations  None recommended by OT    Recommendations for Other Services      Precautions / Restrictions Precautions Precautions: Fall Recall of Precautions/Restrictions:  Impaired Restrictions Weight Bearing Restrictions Per Provider Order: No       Mobility Bed Mobility Overal bed mobility: Needs Assistance Bed Mobility: Rolling, Sidelying to Sit, Sit to Sidelying Rolling: Max assist, +2 for physical assistance Sidelying to sit: Max assist, +2 for physical assistance     Sit to sidelying: Max assist, +2 for physical assistance General bed mobility comments: Pt required Max A +2 for all mobility. Pt able to initiate BLE towards EOB but could not complete.    Transfers Overall transfer level: Needs assistance                 General transfer comment: Pt lethargic this date, did not transfer OOB for safety.     Balance Overall balance assessment: Needs assistance Sitting-balance support: Bilateral upper extremity supported, Feet supported Sitting balance-Leahy Scale: Poor Sitting balance - Comments: Pt with right posterior lean seated EOB requiring up to Max A physical assistance and multimodal cues to bring body to midline. Pt unable to correct leans without assistance. Postural control: Posterior lean, Right lateral lean                                 ADL either performed or assessed with clinical judgement   ADL Overall ADL's : Needs assistance/impaired Eating/Feeding: Moderate assistance   Grooming: Moderate assistance;Wash/dry face;Oral care   Upper Body Bathing: Bed level;Maximal assistance   Lower Body Bathing: Total assistance;Bed level   Upper Body Dressing : Maximal assistance   Lower Body Dressing: Total assistance               Functional mobility during ADLs: Maximal assistance;+2 for physical assistance General ADL Comments: Pt required hand over hand assistance and repeated verbal cues to engage in grooming tasks seated  EOB.    Extremity/Trunk Assessment Upper Extremity Assessment Upper Extremity Assessment: Generalized weakness;RUE deficits/detail;LUE deficits/detail RUE Deficits / Details:  Poor grip and FMC, difficulty bringing hand to mouth. Shoulder appears WFL. RUE Coordination: decreased fine motor;decreased gross motor LUE Deficits / Details: Pt able to loosely grip toothbrush and washcloth. Pt required Mod A to bring hand to mouth with hand-over-hand assistance and support at elbow. LUE Coordination: decreased fine motor;decreased gross motor            Vision   Vision Assessment?: No apparent visual deficits Additional Comments: Pt able to direct gaze at OT in front of him when given explicit verbal cues.   Perception     Praxis     Communication Communication Communication: Impaired Factors Affecting Communication: Hearing impaired   Cognition Arousal: Lethargic Behavior During Therapy: Flat affect Cognition: Cognition impaired   Orientation impairments: Place, Time, Situation Awareness: Intellectual awareness impaired, Online awareness impaired Memory impairment (select all impairments): Short-term memory, Working [employer] fast streamer, Conservation officer, historic buildings, Non-declarative long-term memory Attention impairment (select first level of impairment): Focused attention, Divided attention, Selective attention, Sustained attention, Alternating attention Executive functioning impairment (select all impairments): Initiation, Organization, Sequencing, Reasoning, Problem solving OT - Cognition Comments: Pt lethargic but arousable to name and simple instruction. Pt with poor initiation and sequencing motor actions.                 Following commands: Impaired Following commands impaired: Follows one step commands inconsistently      Cueing   Cueing Techniques: Verbal cues, Tactile cues, Gestural cues  Exercises      Shoulder Instructions       General Comments Family at bedside educated on bed mobility and personal care strategies for return to home with hospice. Family educated on log roll technqiue, proper anterior and posterior peri care, hospital bed  functions, and safe body mechanics when caring for Pt. OT provided verbal and visual simulation of personal care strategies. Family verbalized understanding but would benefit from continued hands-on education prior to discharge.    Pertinent Vitals/ Pain       Pain Assessment Pain Assessment: PAINAD Breathing: occasional labored breathing, short period of hyperventilation Negative Vocalization: occasional moan/groan, low speech, negative/disapproving quality Facial Expression: smiling or inexpressive Body Language: relaxed Consolability: no need to console PAINAD Score: 2 Pain Location: generalized Pain Descriptors / Indicators: Grimacing Pain Intervention(s): Monitored during session, Repositioned  Home Living                                          Prior Functioning/Environment              Frequency  Min 2X/week        Progress Toward Goals  OT Goals(current goals can now be found in the care plan section)  Progress towards OT goals: Not progressing toward goals - comment (Pt with limited progress towards goals, family deciding home with hospice.)  Acute Rehab OT Goals Patient Stated Goal: None stated this session OT Goal Formulation: Patient unable to participate in goal setting Time For Goal Achievement: 04/15/24 Potential to Achieve Goals: Fair ADL Goals Pt Will Perform Grooming: with set-up;sitting Pt Will Perform Upper Body Bathing: with min assist;sitting Pt Will Perform Lower Body Bathing: with mod assist;sitting/lateral leans  Plan      Co-evaluation  AM-PAC OT 6 Clicks Daily Activity     Outcome Measure   Help from another person eating meals?: A Lot Help from another person taking care of personal grooming?: A Lot Help from another person toileting, which includes using toliet, bedpan, or urinal?: Total Help from another person bathing (including washing, rinsing, drying)?: Total Help from another person  to put on and taking off regular upper body clothing?: A Lot Help from another person to put on and taking off regular lower body clothing?: Total 6 Click Score: 9    End of Session    OT Visit Diagnosis: Unsteadiness on feet (R26.81);Other abnormalities of gait and mobility (R26.89);Muscle weakness (generalized) (M62.81);Other symptoms and signs involving cognitive function   Activity Tolerance Patient limited by lethargy   Patient Left in bed;with call bell/phone within reach;with bed alarm set;with family/visitor present   Nurse Communication          Time: 1132-1206 OT Time Calculation (min): 34 min  Charges: OT General Charges $OT Visit: 1 Visit OT Treatments $Therapeutic Activity: 23-37 mins  Maurilio CROME, OTR/L.  [facility] Acute Rehabilitation  Office: [phone]   Maurilio PARAS Dakota Stangl 04/04/2024, 1:02 PM

## 2024-04-04 NOTE — Plan of Care (Signed)
  Problem: Nutrition: Goal: Adequate nutrition will be maintained Outcome: Progressing   Problem: Coping: Goal: Level of anxiety will decrease Outcome: Progressing   Problem: Pain Managment: Goal: General experience of comfort will improve and/or be controlled Outcome: Progressing

## 2024-04-04 NOTE — Progress Notes (Signed)
  Daily Progress Note   Date: 04/04/2024   [name]  DOB: 11/02/1928  MRN: [id number]  Age / Sex: 89 y.o., male  Attending Physician: Leotis Bogus, MD Primary Care Physician: Kennyth Worth HERO, MD Admit Date: 03/31/2024 Length of Stay: 4 days  Reason for Follow-up: {Reason for Consult:23484}  Past Medical History:  Diagnosis Date   Arthritis    Clotting disorder    Coronary artery disease    Diabetes ([name])    Hyperlipidemia    Hypertension    Hypothyroidism 07/17/2020   Kidney disease    Loss of hearing    Macular degeneration    Myocardial infarction [facility])    Osteoarthritis    Stroke ([name])    Venous thromboembolism (VTE) 07/17/2020    Subjective:   Subjective: Chart Reviewed. Updates received. Patient Assessed. Created space and opportunity for patient  and family to explore thoughts and feelings regarding current medical situation.  Today's Discussion: Today before meeting with the patient/family, I reviewed the chart notes including ***. I also reviewed vital signs, nursing flowsheets, medication administrations record, labs, and imaging. Labs reviewed include ***.  ***  Review of Systems  Objective:   Primary Diagnoses: Present on Admission:  Aspiration pneumonia ([name])   Vital Signs:  BP (!) 142/57 (BP Location: Right Arm)   Pulse 71   Temp 99.2 F (37.3 C) (Oral)   Resp 16   Ht 5' 8 (1.727 m)   Wt 79.2 kg   SpO2 95%   BMI 26.55 kg/m   Physical Exam  Palliative Assessment/Data: ***   Existing Vynca/ACP Documentation: ***  Advanced Care Planning:   Existing Vynca/ACP Documentation: ***  Primary Decision Maker: {Primary Decision Fjxzm:78612}  Pertinent diagnosis: ***  The patient and/or family consented to a voluntary Advance Care Planning Conversation in person/over the phone***. Individuals present for the conversation: ***  Summary of the conversation: ***  Outcome of the conversations and/or documents  completed: ***  I spent *** minutes providing separately identifiable ACP services with the patient and/or surrogate decision maker in a voluntary, in-person conversation discussing the patient's wishes and goals as detailed in the above note.  Assessment & Plan:   HPI/Patient Profile:  ***  SUMMARY OF RECOMMENDATIONS   ***  Symptom Management:  ***  Code Status: {Updated Palliative Code Status:33307}  Prognosis: {Palliative Care Prognosis:23504}  Discharge Planning: {Palliative dispostion:23505}  Discussed with: ***  Thank you for allowing us  to participate in the care of Thomas [name] PMT will continue to support holistically.  Time Total: ***  Detailed review of medical records (labs, imaging, vital signs), medically appropriate exam, discussed with treatment team, counseling and education to patient, family, & staff, documenting clinical information, medication management, coordination of care  Camellia Kays, NP Palliative Medicine Team  Team Phone # [phone] (Nights/Weekends)  02/24/2021, 8:17 AM

## 2024-04-05 DIAGNOSIS — J690 Pneumonitis due to inhalation of food and vomit: Secondary | ICD-10-CM | POA: Diagnosis not present

## 2024-04-05 LAB — CULTURE, BLOOD (ROUTINE X 2)
Culture: NO GROWTH
Culture: NO GROWTH
Special Requests: ADEQUATE
Special Requests: ADEQUATE

## 2024-04-05 LAB — CBC
HCT: 28.4 % — ABNORMAL LOW (ref 39.0–52.0)
Hemoglobin: 9 g/dL — ABNORMAL LOW (ref 13.0–17.0)
MCH: 32.7 pg (ref 26.0–34.0)
MCHC: 31.7 g/dL (ref 30.0–36.0)
MCV: 103.3 fL — ABNORMAL HIGH (ref 80.0–100.0)
Platelets: 197 K/uL (ref 150–400)
RBC: 2.75 MIL/uL — ABNORMAL LOW (ref 4.22–5.81)
RDW: 12.4 % (ref 11.5–15.5)
WBC: 16.3 K/uL — ABNORMAL HIGH (ref 4.0–10.5)
nRBC: 0 % (ref 0.0–0.2)

## 2024-04-05 LAB — BASIC METABOLIC PANEL WITH GFR
Anion gap: 10 (ref 5–15)
BUN: 24 mg/dL — ABNORMAL HIGH (ref 8–23)
CO2: 23 mmol/L (ref 22–32)
Calcium: 8 mg/dL — ABNORMAL LOW (ref 8.9–10.3)
Chloride: 110 mmol/L (ref 98–111)
Creatinine, Ser: 1.19 mg/dL (ref 0.61–1.24)
GFR, Estimated: 57 mL/min — ABNORMAL LOW (ref >60)
Glucose, Bld: 283 mg/dL — ABNORMAL HIGH (ref 70–99)
Potassium: 4.2 mmol/L (ref 3.5–5.1)
Sodium: 143 mmol/L (ref 135–145)

## 2024-04-05 LAB — GLUCOSE, CAPILLARY
Glucose-Capillary: 286 mg/dL — ABNORMAL HIGH (ref 70–99)
Glucose-Capillary: 450 mg/dL — ABNORMAL HIGH (ref 70–99)
Glucose-Capillary: 351 mg/dL — ABNORMAL HIGH (ref 70–99)
Glucose-Capillary: 287 mg/dL — ABNORMAL HIGH (ref 70–99)

## 2024-04-05 LAB — MAGNESIUM: Magnesium: 2.2 mg/dL (ref 1.7–2.4)

## 2024-04-05 LAB — PHOSPHORUS: Phosphorus: 2.6 mg/dL (ref 2.5–4.6)

## 2024-04-05 MED ORDER — ACETAMINOPHEN 325 MG PO TABS
650.0000 mg | ORAL_TABLET | Freq: Once | ORAL | Status: AC
  Administered 2024-04-05: 650 mg via ORAL
  Filled 2024-04-05: qty 2

## 2024-04-05 NOTE — Progress Notes (Signed)
 Daily Progress Note   Date: 04/05/2024   [name]  DOB: [birth year]  MRN: [id number]  Age / Sex: 89 y.o., male  Attending Physician: Leotis Bogus, MD Primary Care Physician: Kennyth Worth HERO, MD Admit Date: 03/31/2024 Length of Stay: 5 days  Reason for Follow-up: Establishing goals of care and Psychosocial/spiritual support  Past Medical History:  Diagnosis Date   Arthritis    Clotting disorder    Coronary artery disease    Diabetes ([name])    Hyperlipidemia    Hypertension    Hypothyroidism 07/17/2020   Kidney disease    Loss of hearing    Macular degeneration    Myocardial infarction [facility])    Osteoarthritis    Stroke ([name])    Venous thromboembolism (VTE) 07/17/2020    Subjective:   Subjective: Chart Reviewed. Updates received. Patient Assessed. Created space and opportunity for patient  and family to explore thoughts and feelings regarding current medical situation.  Today's Discussion: Today before meeting with the patient/family, I reviewed the chart notes including dietitian note from yesterday, nursing note from yesterday.  Later in the day I also reviewed family medicine note, TOC note, SLP note. I also reviewed vital signs, nursing flowsheets, medication administrations record, labs, and imaging. Labs reviewed include CBC which shows progressive leukocytosis increased to 16.3 today from 14.3 yesterday today in the setting of aspiration pneumonia.  BMP shows stable creatinine at 1.19 today which is normal with recently elevated creatinine as high as 2.02 six days ago.  Today's I saw the patient at bedside, no family was present.  The patient is sleeping comfortably and elected to not wake him.  Respirations appear even and unlabored, no obvious sign of distress or discomfort.  Pending transfer to hospice care hopefully today or tomorrow.  I spoke with the case manager who states that family has also expressed interest in possible inpatient hospice also  at [facility]  They will come to evaluate the patient tomorrow morning and if he is excepted they should be able to offer a bed tomorrow.  Later in the day I called the patient's wife.  She states that he just returned home from visiting in the hospital.  I last visited him Thomas [name] finally discussed the difficult situation she is in.  She states that she is exhausted and struggling to take care of herself.  Her kids are her support and continue to encourage her to stay hydrated, nourished, and rest.  I also encouraged her to do the same.  I shared the update on possible evaluation tomorrow morning for inpatient hospice.  She expressed appreciation for all the care her husband has received and asked us  to continue to keep her and her family and our thoughts and prayers.  I shared that we would do that and I would follow-up with the patient tomorrow again.  I provided emotional and general support through therapeutic listening, empathy, sharing of stories, and other techniques. I answered all questions and addressed all concerns to the best of my ability.  Review of Systems  Unable to perform ROS   Objective:   Primary Diagnoses: Present on Admission:  Aspiration pneumonia ([name])   Vital Signs:  BP (!) 172/56   Pulse 74   Temp 99.9 F (37.7 C) (Oral)   Resp 16   Ht 5' 8 (1.727 m)   Wt 79.2 kg   SpO2 98%   BMI 26.55 kg/m   Physical Exam Vitals and nursing note reviewed.  Constitutional:  General: He is sleeping. He is not in acute distress. HENT:     Head: Normocephalic and atraumatic.  Cardiovascular:     Rate and Rhythm: Normal rate.  Pulmonary:     Effort: Pulmonary effort is normal. No respiratory distress.     Comments: Respirations even and unlabored Abdominal:     General: Abdomen is flat.     Palliative Assessment/Data: 10%   Existing Vynca/ACP Documentation: None  Assessment & Plan:   HPI/Patient Profile:  89 y.o. male  with past medical history of type 2  diabetes, hypertension, hypothyroidism, paroxysmal A-fib on Eliquis , sick sinus syndrome status post pacemaker placement, and recent admission on  for acute CVA (left thalamic infarct and Small acute to subacute left frontal lobe infarcts)  admitted on 03/31/2024 with dyspnea at SNF.    Of note, since CVA on 9/23, pt has been bed bound with intermittent lethargy and able to tol PO infrequently. In the ED, pt febrile 100.9, hypertensive, and tachypneic W/O hypoxia. Labs notable for na 148, glucose 606, BNP 229, lactic acid 2.4-->1.9, and WBC 16.4.  He was admitted with acute hypoxic respiratory failure and sepsis due to aspiration pneumonia, AKI and failure to thrive.  MBS on 10/6 went poorly, patient now n.p.o. PMT has been consulted to assist with goals of care conversation, anticipatory care needs, complex medical decision making.  SUMMARY OF RECOMMENDATIONS   DNR-Limited Continue current scope of care Work toward discharge home with hospice versus inpatient hospice with [facility], possibly tomorrow, pending evaluation by [facility] Ongoing support of patient and family Palliative medicine will continue to follow and support  Symptom Management:  Per primary team Palliative medicine is available to assist as needed  Code Status: DNR - Limited (DNR/DNI)  Prognosis: < 2 weeks  Discharge Planning: Home with Hospice  Discussed with: Patient's family, medical team, nursing team, [facility] team  Thank you for allowing us  to participate in the care of Arslan Kier PMT will continue to support holistically.  Billing based on MDM: Moderate  Detailed review of medical records (labs, imaging, vital signs), medically appropriate exam, discussed with treatment team, counseling and education to patient, family, & staff, documenting clinical information, medication management, coordination of care  Camellia Kays, NP Palliative Medicine Team  Team Phone # [phone] (Nights/Weekends)  02/24/2021,  8:17 AM

## 2024-04-05 NOTE — TOC Progression Note (Addendum)
 Transition of Care (TOC) - Progression Note   Spoke to family members at bedside including son [name] and patient's wife. Family asking about inpatient hospice at residential hospice.   NCM discussed criteria for inpatient hospice. NCM will call [name] with [facility] to see if patient meets their criteria [facility].   Called [name] with [facility] , she will have a nurse come to the hospital tomorrow to assess [name] to see if he meets their criteria for inpatient hospice. Family aware   [name] called back, [name] will have a nurse at hospital tomorrow between 9 am and 10 am , if they accept patient, they will have a bed available tomorrow.   Secure chatted team and family aware     Patient Details  Name: Thomas [name] MRN: [id number] Date of Birth: [birth year]  Transition of Care [facility]) CM/SW Contact  [name], [name], RN Phone Number: 04/05/2024, 12:56 PM  Clinical Narrative:       Expected Discharge Plan: Home w Hospice Care Barriers to Discharge: Continued Medical Work up               Expected Discharge Plan and Services In-house Referral: Clinical Social Work Discharge Planning Services: CM Consult Post Acute Care Choice: Hospice Living arrangements for the past 2 months: Single Family Home                 DME Arranged:  (see note)                     Social Drivers of Health (SDOH) Interventions SDOH Screenings   Food Insecurity: No Food Insecurity (03/31/2024)  Housing: Low Risk  (03/31/2024)  Transportation Needs: No Transportation Needs (03/31/2024)  Utilities: Not At Risk (03/31/2024)  Alcohol Screen: Low Risk  (10/10/2023)  Depression (PHQ2-9): Low Risk  (02/06/2024)  Financial Resource Strain: Low Risk  (10/10/2023)  Physical Activity: Insufficiently Active (10/10/2023)  Social Connections: Unknown (03/31/2024)  Stress: No Stress Concern Present (10/10/2023)  Tobacco Use: Medium Risk (03/31/2024)  Health Literacy: Adequate Health Literacy  (10/10/2023)    Readmission Risk Interventions     No data to display

## 2024-04-05 NOTE — Progress Notes (Addendum)
 PROGRESS NOTE    Thomas [name]  FMW:[id number] DOB: 1928-11-18 DOA: 03/31/2024 PCP: Kennyth Worth HERO, MD   Brief Narrative:  This 89 y.o. Male with pAF and SSS s/p PPM, on Eliquis , recent stroke, DM, HTN, and hypothyroidism admitted from rehab with hypoxia and decreased responsiveness after an aspiration event.   Assessment & Plan:   Principal Problem:   Aspiration pneumonia ([name]) Active Problems:   Malnutrition of moderate degree   Sepsis due to aspiration pneumonia: Acute hypoxic respiratory failure: He presented with fever, leukocytosis, renal failure, respiratory failure and decreased mentation in setting of aspiration after recent stroke. Evaluated by SLP and recommended puree, honey thick. In addition to weakness, recent stroke, he has cervical spine osteophytes and postural issues that limit epiglottic action.   -Aspiration recurrence risk is high under any circumstances. - Continue Rocephin, day 5 of 6 - Continue RT   Hypernatremia > Improved. Acute renal failure: > Resolved.  Dysphagia: Failure to thrive: Prior to stroke last month, He was living with wife, able to transfer with walker and had limited but reasonably good quality of life.  After stroke, He was able to stand and transfer prior to discharge to rehab.  Na, Cr were normal, alertness was worse than previous baseline but awake most of the day. -At rehab, he became somnolent, even on first day of therapy, could not stand even with 2+ assist.  After 5 days, readmitted with hypernatremia, AKI and aspiration pneumonia. -MBS 10/6 showed substantial aspiration of thin and nectar thick liquids.    - Palliative consulted, and we had extensive conversations with family.  Discussed fluid consistency and competing risks of aspiration or dehydration.  Family point out that he did not perform well with PT in rehab, and they have asked about going home.   - Dysphagia 1 with honey thick for now. - Consult Hospice for discharge  to home. - SLP consult appreciated. - Defer food consistency to family at discharge, given comfort level. - Family thinking about readmission goals. - Given overall picture, I have concerns things will progress quickly    Acute metabolic encephalopathy: At baseline prior to stroke, the patient had no significant memory loss but was [facility] and physically limited.  Since stroke, has had waxing and waning disorientation.  On arrival he was poorly responsive due to sepsis, now resolved.    - Delirium precautions.   Anemia: Presumably from chronic kidney disease, no bleeding reported or observed - Trend hemoglobin   AKI on CKD IIIa: Creatinine up to 2 in the setting of sepsis, now improved to baseline. - Continue IV fluids - Hold Lasix  for now   Recent stroke: Cerebrovascular disease Hypertension Admitted recently for left thalamic infarct and small left frontal lobe infarct, likely due to large and small vessel disease. Discharged on new aspirin  combined with Eliquis  . -Continue aspirin , apixaban , rosuvastatin  - Hold furosemide    Diabetes with hyperglycemia: Patient had glucose 500 on admission.  Remained elevated until he was made NPO on 10/6 - Continue glargine, - Continue SS corrections, resistant     Hypothyroidism: - Continue levothyroxine    Paroxysmal atrial fibrillation Sick sinus syndrome Pacemaker in place Heart rate controlled -Continue amiodarone , apixaban , carvedilol    History of orthostasis: Uses midodrine  at baseline, BP here elevated. - Stop midodrine    BPH: Flomax  cannot be crushed - Transition to finasteride   Hypokalemia: - Add K to fluids. Resolved.   DVT prophylaxis:Eliquis  Code Status: DNR Family Communication: No family at bed side. Disposition Plan:  Status is: Inpatient Remains inpatient appropriate because: Severity of illness.    Consultants:  None  Procedures:  Antimicrobials: Anti-infectives (From admission, onward)    Start      Dose/Rate Route Frequency Ordered Stop   04/01/24 1330  cefTRIAXone (ROCEPHIN) 2 g in sodium chloride  0.9 % 100 mL IVPB        2 g 200 mL/hr over 30 Minutes Intravenous Every 24 hours 04/01/24 1239 04/07/24 1329   03/31/24 1045  cefTRIAXone (ROCEPHIN) 1 g in sodium chloride  0.9 % 100 mL IVPB        1 g 200 mL/hr over 30 Minutes Intravenous  Once 03/31/24 1033 03/31/24 1122   03/31/24 1045  azithromycin (ZITHROMAX) 500 mg in sodium chloride  0.9 % 250 mL IVPB        500 mg 250 mL/hr over 60 Minutes Intravenous  Once 03/31/24 1033 03/31/24 1225      Subjective: Patient was seen and examined at bedside.  Overnight events noted. Patient does not talk much but alert and following commands,  He is from SNF.  Sodium has improved. Family in process for transitioning care to comfort focused care.  Objective: Vitals:   04/04/24 1819 04/04/24 2322 04/05/24 0650 04/05/24 0852  BP: (!) 154/65 (!) 149/50 (!) 162/91 (!) 172/56  Pulse: 70 70 69 74  Resp: 16 17 18 16   Temp: 98.3 F (36.8 C) 99.1 F (37.3 C) 99 F (37.2 C) 99.9 F (37.7 C)  TempSrc: Oral Oral Axillary Oral  SpO2: 100% 100% 99% 98%  Weight:      Height:        Intake/Output Summary (Last 24 hours) at 04/05/2024 1214 Last data filed at 04/05/2024 0900 Gross per 24 hour  Intake 853 ml  Output 2600 ml  Net -1747 ml   Filed Weights   04/04/24 1012  Weight: 79.2 kg    Examination:  General exam: Appears calm and comfortable, deconditioned, not in any acute distress. Respiratory system: CTA Bilaterally . Respiratory effort normal.  RR 14 Cardiovascular system: S1 & S2 heard, RRR. No JVD, murmurs, rubs, gallops or clicks.  Gastrointestinal system: Abdomen is nondistended, soft and nontender.  Normal bowel sounds heard. Central nervous system: Alert and oriented x 1. No focal neurological deficits. Extremities: No edema, no cyanosis, no clubbing. Skin: No rashes, lesions or ulcers Psychiatry: Judgement and insight appear  normal. Mood & affect appropriate.     Data Reviewed: I have personally reviewed following labs and imaging studies  CBC: Recent Labs  Lab 03/31/24 0733 03/31/24 0814 04/02/24 1106 04/03/24 0603 04/04/24 0431 04/05/24 0445  WBC 16.4*  --  14.3* 14.2* 14.3* 16.3*  NEUTROABS 12.1*  --   --   --   --   --   HGB 12.4* 12.6* 10.4* 8.7* 9.2* 9.0*  HCT 39.4 37.0* 33.5* 27.2* 28.7* 28.4*  MCV 104.5*  --  106.3* 105.0* 102.9* 103.3*  PLT 269  --  229 179 195 197   Basic Metabolic Panel: Recent Labs  Lab 03/31/24 2053 03/31/24 2054 04/02/24 1106 04/03/24 0603 04/04/24 0431 04/05/24 0445  NA 150*  --  151* 149* 145 143  K 3.7  --  3.8 3.4* 4.1 4.2  CL 116*  --  118* 116* 114* 110  CO2 22  --  24 24 20* 23  GLUCOSE 561* 559* 456* 130* 259* 283*  BUN 52*  --  49* 31* 28* 24*  CREATININE 1.64*  --  1.52* 1.15 1.14 1.19  CALCIUM  8.5*  --  8.2* 7.9* 7.8* 8.0*  MG  --   --   --   --   --  2.2  PHOS  --   --   --  3.3  --  2.6   GFR: Estimated Creatinine Clearance: 37.5 mL/min (by C-G formula based on SCr of 1.19 mg/dL). Liver Function Tests: Recent Labs  Lab 04/03/24 0603 04/04/24 0431  AST  --  35  ALT  --  45*  ALKPHOS  --  49  BILITOT  --  0.4  PROT  --  5.6*  ALBUMIN 2.2* 2.1*   No results for input(s): LIPASE, AMYLASE in the last 168 hours. No results for input(s): AMMONIA in the last 168 hours. Coagulation Profile: No results for input(s): INR, PROTIME in the last 168 hours. Cardiac Enzymes: No results for input(s): CKTOTAL, CKMB, CKMBINDEX, TROPONINI in the last 168 hours. BNP (last 3 results) No results for input(s): PROBNP in the last 8760 hours. HbA1C: No results for input(s): HGBA1C in the last 72 hours. CBG: Recent Labs  Lab 04/04/24 1200 04/04/24 1719 04/04/24 2122 04/04/24 2321 04/05/24 0811  GLUCAP 358* 225* 156* 324* 286*   Lipid Profile: No results for input(s): CHOL, HDL, LDLCALC, TRIG, CHOLHDL, LDLDIRECT  in the last 72 hours. Thyroid  Function Tests: No results for input(s): TSH, T4TOTAL, FREET4, T3FREE, THYROIDAB in the last 72 hours. Anemia Panel: No results for input(s): VITAMINB12, FOLATE, FERRITIN, TIBC, IRON, RETICCTPCT in the last 72 hours. Sepsis Labs: Recent Labs  Lab 03/31/24 0814 03/31/24 1041  LATICACIDVEN 2.4* 1.9    Recent Results (from the past 240 hours)  Resp panel by RT-PCR (RSV, Flu A&B, Covid) Anterior Nasal Swab     Status: None   Collection Time: 03/31/24  7:33 AM   Specimen: Anterior Nasal Swab  Result Value Ref Range Status   SARS Coronavirus 2 by RT PCR NEGATIVE NEGATIVE Final   Influenza A by PCR NEGATIVE NEGATIVE Final   Influenza B by PCR NEGATIVE NEGATIVE Final    Comment: (NOTE) The Xpert Xpress SARS-CoV-2/FLU/RSV plus assay is intended as an aid in the diagnosis of influenza from Nasopharyngeal swab specimens and should not be used as a sole basis for treatment. Nasal washings and aspirates are unacceptable for Xpert Xpress SARS-CoV-2/FLU/RSV testing.  Fact Sheet for Patients: BloggerCourse.com  Fact Sheet for Healthcare Providers: SeriousBroker.it  This test is not yet approved or cleared by the [place]  FDA and has been authorized for detection and/or diagnosis of SARS-CoV-2 by FDA under an Emergency Use Authorization (EUA). This EUA will remain in effect (meaning this test can be used) for the duration of the COVID-19 declaration under Section 564(b)(1) of the Act, 21 U.S.C. section 360bbb-3(b)(1), unless the authorization is terminated or revoked.     Resp Syncytial Virus by PCR NEGATIVE NEGATIVE Final    Comment: (NOTE) Fact Sheet for Patients: BloggerCourse.com  Fact Sheet for Healthcare Providers: SeriousBroker.it  This test is not yet approved or cleared by the [place]  FDA and has been authorized  for detection and/or diagnosis of SARS-CoV-2 by FDA under an Emergency Use Authorization (EUA). This EUA will remain in effect (meaning this test can be used) for the duration of the COVID-19 declaration under Section 564(b)(1) of the Act, 21 U.S.C. section 360bbb-3(b)(1), unless the authorization is terminated or revoked.  Performed at [facility], [address] [place], [place] [address]   Blood culture (routine x 2)     Status:  None   Collection Time: 03/31/24  7:56 AM   Specimen: BLOOD RIGHT ARM  Result Value Ref Range Status   Specimen Description BLOOD RIGHT ARM  Final   Special Requests   Final    BOTTLES DRAWN AEROBIC AND ANAEROBIC Blood Culture adequate volume   Culture   Final    NO GROWTH 5 DAYS Performed at [facility], [address] [place], [place] [address]    Report Status 04/05/2024 FINAL  Final  Blood culture (routine x 2)     Status: None   Collection Time: 03/31/24  8:50 AM   Specimen: BLOOD LEFT ARM  Result Value Ref Range Status   Specimen Description BLOOD LEFT ARM  Final   Special Requests   Final    BOTTLES DRAWN AEROBIC AND ANAEROBIC Blood Culture adequate volume   Culture   Final    NO GROWTH 5 DAYS Performed at [facility], [address] [place], [place] [address]    Report Status 04/05/2024 FINAL  Final    Radiology Studies: No results found.  Scheduled Meds:  amiodarone   50 mg Oral Daily   apixaban   2.5 mg Oral BID   aspirin   81 mg Oral Daily   finasteride  5 mg Oral Daily   insulin  aspart  0-20 Units Subcutaneous TID WC   insulin  aspart  0-5 Units Subcutaneous QHS   insulin  glargine  13 Units Subcutaneous Daily   levothyroxine   112 mcg Oral Q0600   multivitamin with minerals  1 tablet Oral Daily   rosuvastatin   20 mg Oral QHS   Continuous Infusions:  0.45 % NaCl with KCl 20 mEq / L 100 mL/hr at 04/05/24 0418   cefTRIAXone (ROCEPHIN)  IV 2 g (04/04/24 1303)     LOS: 5 days    Time spent: 35  mins    Darcel Dawley, MD [facility]   If 7PM-7AM, please contact night-coverage

## 2024-04-05 NOTE — Progress Notes (Signed)
 Physical Therapy Treatment Patient Details Name: Abel Hageman MRN: [id number] DOB: 02/04/1929 Today's Date: 04/05/2024   History of Present Illness Cort Dragoo is a 89 y.o. male admitted on 03/31/24 from [facility] for acute hypoxic respiratory failure secondary to aspiration pneumonia. 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    PT Comments  Pt resting in bed on arrival, multiple family members at bedside. Pt requiring max-total A +2 to perform rolling R/L to assist with peri-care with pt able to initiate rolling to R with LUE and LLE however needing hand over hand to reach for rail and complete roll, pt requiring total A to roll to L. Transfer to EOB deferred this session due to pt lethargy. Education provided to pt and family re; positioning, importance of positional changes and frequency for pressure relief, A/AROM HEP to maintain ROM and comfort and body mechanics during pt care with pt and family verbalizing/demonstrating understanding. Will continue to follow acutely.    If plan is discharge home, recommend the following: Assistance with cooking/housework;Help with stairs or ramp for entrance;Assist for transportation;Supervision due to cognitive status;A lot of help with bathing/dressing/bathroom;Two people to help with walking and/or transfers;Assistance with feeding   Can travel by private vehicle     No  Equipment Recommendations  None recommended by PT;Other (comment) (defer to next level of care)    Recommendations for Other Services       Precautions / Restrictions Precautions Precautions: Fall Recall of Precautions/Restrictions: Impaired Restrictions Weight Bearing Restrictions Per Provider Order: No     Mobility  Bed Mobility Overal bed mobility: Needs  Assistance Bed Mobility: Rolling Rolling: Max assist, +2 for physical assistance         General bed mobility comments: Pt required Max A +2 for all mobility. pt able to follow commands to bend L knee and reach across to rail but needing max A to complete rolling and maintain sidelying to assist with peri-care    Transfers                   General transfer comment: nt this session    Ambulation/Gait                   Stairs             Wheelchair Mobility     Tilt Bed    Modified Rankin (Stroke Patients Only) Modified Rankin (Stroke Patients Only) Pre-Morbid Rankin Score: Moderate disability Modified Rankin: Severe disability     Balance                                            Communication Communication Communication: Impaired Factors Affecting Communication: Hearing impaired  Cognition Arousal: Lethargic Behavior During Therapy: Flat affect   PT - Cognitive impairments: Sequencing, Problem solving, Safety/Judgement, Memory, Awareness, Orientation, No family/caregiver present to determine baseline                       PT - Cognition Comments: pt largely non verbal throughout session Following commands: Impaired Following commands impaired: Follows one step commands inconsistently    Cueing Cueing Techniques: Verbal cues, Tactile cues, Gestural cues  Exercises Low Level/ICU Exercises  Ankle Circles/Pumps: PROM, Both, 10 reps, Supine Hip ABduction/ADduction: PROM, Both, 10 reps Heel Slides: PROM, Both, 10 reps, Supine Shoulder Flexion: PROM, Both, 10 reps, Supine Elbow Flexion: PROM, Both, 10 reps, Supine    General Comments General comments (skin integrity, edema, etc.): Family at bedside contineud education on bed mobility and personal care strategies for return to home with hospice. Family educated on log roll technqiue, proper anterior and posterior peri care, hospital bed functions, and safe body  mechanics when caring for Pt as well as importance of positonal changes Q2 for pressure relief. PTA provided verbal and visual simulation of personal care strategies.      Pertinent Vitals/Pain Pain Assessment Pain Assessment: Faces Faces Pain Scale: Hurts little more Pain Location: generalized Pain Descriptors / Indicators: Grimacing, Guarding, Moaning Pain Intervention(s): Monitored during session, Limited activity within patient's tolerance    Home Living                          Prior Function            PT Goals (current goals can now be found in the care plan section) Acute Rehab PT Goals Patient Stated Goal: unable to state PT Goal Formulation: Patient unable to participate in goal setting Time For Goal Achievement: 04/15/24 Progress towards PT goals: Not progressing toward goals - comment (increased lethargy)    Frequency    Min 2X/week      PT Plan      Co-evaluation              AM-PAC PT 6 Clicks Mobility   Outcome Measure  Help needed turning from your back to your side while in a flat bed without using bedrails?: Total Help needed moving from lying on your back to sitting on the side of a flat bed without using bedrails?: Total Help needed moving to and from a bed to a chair (including a wheelchair)?: Total Help needed standing up from a chair using your arms (e.g., wheelchair or bedside chair)?: Total Help needed to walk in hospital room?: Total Help needed climbing 3-5 steps with a railing? : Total 6 Click Score: 6    End of Session   Activity Tolerance: Patient limited by fatigue Patient left: with call bell/phone within reach;in bed;with bed alarm set;with family/visitor present Nurse Communication: Mobility status PT Visit Diagnosis: Muscle weakness (generalized) (M62.81);Other abnormalities of gait and mobility (R26.89)     Time: 8868-8844 PT Time Calculation (min) (ACUTE ONLY): 24 min  Charges:    $Therapeutic  Activity: 23-37 mins PT General Charges $$ ACUTE PT VISIT: 1 Visit                     Annaliesa Blann R. PTA Acute Rehabilitation Services Office: [phone]   Therisa CHRISTELLA Boor 04/05/2024, 4:15 PM

## 2024-04-05 NOTE — Progress Notes (Signed)
 Speech Language Pathology Treatment: Dysphagia  Patient Details Name: Thomas [name] MRN: [id number] DOB: Apr 29, 1929 Today's Date: 04/05/2024 Time: 8659-8651 SLP Time Calculation (min) (ACUTE ONLY): 8 min  Assessment / Plan / Recommendation Clinical Impression  Pt was sleeping this afternoon, but family says that he ate a small amount of his lunch. His wife, two of his sons, and a daughter-in-law are at bedside. Given that he was sleeping soundly, and per son Thomas [name] has been sleeping a lot today, session focused primarily on education and family training. Note that they are still exploring their options, including home with hospice and private caregivers vs inpatient hospice. SLP provided a starter kit with thickener and additional supplies. Education was given about how to thicken liquids, including how to perform IDDSI flow test if desired. Thomas [name] again acknowledged risks on either side, including aspiration with thin liquids vs dehydration with thicker liquids. All questions were answered for now, and they are anticipating they may be more ready to make a decision about next steps after meeting with hospice representative tomorrow morning. SLP will remain available.    HPI HPI: Pt is a 89 y.o. presents today with AHRF iso aspiration event. History provided by wife and son at the bedside. Per their report, they were called early this morning around 0200 when the pt was evaluated by staff who was c/f aspiration given increased O2 requierment and difficulty breathing. CXR (10/4) revealed, No pleural effusion, acute consolidation of lung, or pneumothorax. No acute to suggest the patient's hypoxia. pt on puree/NTL at SNF, they also note possible instrumental assessment completed. PMH: type 2 diabetes, hypertension, hypothyroidism, paroxysmal A-fib on Eliquis , sick sinus syndrome status post pacemaker placement, and recent admission on  for acute CVA (left thalamic infarct and Small acute to subacute left  frontal lobe infarcts).      SLP Plan  Continue with current plan of care          Recommendations  Diet recommendations: Dysphagia 1 (puree);Honey-thick liquid Liquids provided via: Cup;Straw Medication Administration: Crushed with puree Supervision: Staff to assist with self feeding;Full supervision/cueing for compensatory strategies Compensations: Minimize environmental distractions;Slow rate;Small sips/bites;Multiple dry swallows after each bite/sip Postural Changes and/or Swallow Maneuvers: Seated upright 90 degrees;Upright 30-60 min after meal                  Oral care BID     Dysphagia, oropharyngeal phase (R13.12)     Continue with current plan of care     Leita SAILOR., M.A. CCC-SLP Acute Rehabilitation Services Office: [phone]  Secure chat preferred   04/05/2024, 1:57 PM

## 2024-04-06 DIAGNOSIS — J690 Pneumonitis due to inhalation of food and vomit: Secondary | ICD-10-CM | POA: Diagnosis not present

## 2024-04-06 LAB — GLUCOSE, CAPILLARY
Glucose-Capillary: 182 mg/dL — ABNORMAL HIGH (ref 70–99)
Glucose-Capillary: 271 mg/dL — ABNORMAL HIGH (ref 70–99)

## 2024-04-06 MED ORDER — AMIODARONE HCL 100 MG PO TABS: 50.0000 mg | ORAL_TABLET | Freq: Every day | ORAL | Status: DC

## 2024-04-06 MED ORDER — APIXABAN 2.5 MG PO TABS: 2.5000 mg | ORAL_TABLET | Freq: Two times a day (BID) | ORAL | 0 refills | Status: DC

## 2024-04-06 NOTE — Discharge Summary (Addendum)
 Physician Discharge Summary  Thomas [name] FMW:[id number] DOB: June 21, 1929 DOA: 03/31/2024  PCP: Kennyth Worth HERO, MD  Admit date: 03/31/2024  Discharge date: 04/06/2024  Admitted From: Home.  Disposition:  (Residential Hospice) [facility].  Recommendations for Outpatient Follow-up:  Patient is being discharged to residential hospice in [facility]  Home Health: None Equipment/Devices: Home oxygen  Discharge Condition: Stable CODE STATUS: DNR Diet recommendation: Dysphagia I diet  Brief Summary/ Hospital Course: This 89 y.o. Male with pAF and SSS s/p PPM, on Eliquis , recent stroke, DM, HTN, and hypothyroidism admitted from rehab with hypoxia and decreased responsiveness after an aspiration event. Patient was treated with antibiotics and has completed the course of antibiotics.  Given dysphagia speech and swallow evaluation completed,  recommended dysphagia 1 diet with honey thick liquids. Overall patient seems back to his baseline status.  Given overall decline in functioning.  Family agreeable to residential hospice.  Patient is being discharged to residential hospice.   Discharge Diagnoses:  Principal Problem:   Aspiration pneumonia ([name]) Active Problems:   Malnutrition of moderate degree  Sepsis due to aspiration pneumonia: Acute hypoxic respiratory failure: He presented with fever, leukocytosis, renal failure, respiratory failure and decreased mentation in setting of aspiration after recent stroke. Evaluated by SLP and recommended puree, honey thick. In addition to weakness, recent stroke, he has cervical spine osteophytes and postural issues that limit epiglottic action.   -Aspiration recurrence risk is high under any circumstances. - Completed Rocephin for 6 days. - Continue RT   Hypernatremia > Improved. Acute renal failure: > Resolved.   Dysphagia: Failure to thrive: Prior to stroke last month, He was living with wife, able to transfer with walker and had limited  but reasonably good quality of life.  After stroke, He was able to stand and transfer prior to discharge to rehab.  Na, Cr were normal, alertness was worse than previous baseline but awake most of the day. -At rehab, he became somnolent, even on first day of therapy, could not stand even with 2+ assist.  After 5 days, readmitted with hypernatremia, AKI and aspiration pneumonia. -MBS 10/6 showed substantial aspiration of thin and nectar thick liquids.    - Palliative consulted, and we had extensive conversations with family.  Discussed fluid consistency and competing risks of aspiration or dehydration.  Family point out that he did not perform well with PT in rehab, and they have asked about going home.   - Dysphagia 1 with honey thick for now. - Consult Hospice for discharge to home. - SLP consult appreciated. - Defer food consistency to family at discharge, given comfort level. - Family thinking about readmission goals. - Given overall picture, I have concerns things will progress quickly    Acute metabolic encephalopathy: At baseline prior to stroke, the patient had no significant memory loss but was [facility] and physically limited.  Since stroke, has had waxing and waning disorientation.  On arrival he was poorly responsive due to sepsis, now resolved.    - Delirium precautions.   Anemia: Presumably from chronic kidney disease, no bleeding reported or observed    AKI on CKD IIIa: Creatinine up to 2 in the setting of sepsis, now improved to baseline. - Resume Lasix  slowly.   Recent stroke: Cerebrovascular disease Hypertension Admitted recently for left thalamic infarct and small left frontal lobe infarct, likely due to large and small vessel disease. Discharged on new aspirin  combined with Eliquis  . -Continue aspirin , apixaban , rosuvastatin  - Hold furosemide    Diabetes with hyperglycemia: Patient  had glucose 500 on admission.  Remained elevated until he was made NPO on 10/6 - Continue  glargine, - Continue SS corrections, resistant     Hypothyroidism: - Continue levothyroxine    Paroxysmal atrial fibrillation Sick sinus syndrome Pacemaker in place Heart rate controlled -Continue amiodarone , apixaban , carvedilol    History of orthostasis: Uses midodrine  at baseline, BP here elevated. - Stop midodrine    BPH: Flomax  cannot be crushed - Transition to finasteride.   Hypokalemia: - Add K to fluids. Resolved.  Discharge Instructions  Discharge Instructions     Call MD for:  persistant dizziness or light-headedness   Complete by: As directed    Call MD for:  severe uncontrolled pain   Complete by: As directed    Diet - low sodium heart healthy   Complete by: As directed    Diet general   Complete by: As directed    Discharge instructions   Complete by: As directed    Plans to follow-up with primary care physician in 1 week. Patient is being discharged to residential hospice in [place].   Increase activity slowly   Complete by: As directed    No wound care   Complete by: As directed       Allergies as of 04/06/2024       Reactions   Elavil [amitriptyline] Shortness Of Breath, Palpitations   Lyrica [pregabalin] Nausea And Vomiting   Not documented with SNF   Pravachol [pravastatin] Other (See Comments)   Myalgias    Ambien [zolpidem] Anxiety, Palpitations   Effexor Xr [venlafaxine] Anxiety   Ms Contin [morphine] Palpitations        Medication List     STOP taking these medications    cholecalciferol  25 MCG (1000 UNIT) tablet Commonly known as: VITAMIN D3   co-enzyme Q-10 50 MG capsule   glucosamine-chondroitin 500-400 MG tablet   multivitamin tablet   Red Yeast Rice 600 MG Caps   rosuvastatin  20 MG tablet Commonly known as: CRESTOR    SUPER B COMPLEX PO   Super B-50 B Complex Caps       TAKE these medications    acetaminophen  500 MG tablet Commonly known as: TYLENOL  Take 650 mg by mouth in the morning and at  bedtime.   amiodarone  100 MG tablet Commonly known as: PACERONE  Take 1 tablet (100 mg total) by mouth daily. Take 1/2 tablet (100mg ) by mouth once daily What changed:  how much to take additional instructions   apixaban  2.5 MG Tabs tablet Commonly known as: ELIQUIS  Take 1 tablet (2.5 mg total) by mouth 2 (two) times daily. What changed:  medication strength how much to take   aspirin  EC 81 MG tablet Take 1 tablet (81 mg total) by mouth daily. Swallow whole.   carvedilol  3.125 MG tablet Commonly known as: COREG  Take 1 tablet (3.125 mg total) by mouth 2 (two) times daily as needed (hold if SBP <140).   furosemide  20 MG tablet Commonly known as: LASIX  Take 1 tablet (20 mg total) by mouth daily.   insulin  glargine 100 UNIT/ML injection Commonly known as: LANTUS  Inject 0.05 mLs (5 Units total) into the skin daily after breakfast.   ipratropium-albuterol 0.5-2.5 (3) MG/3ML Soln Commonly known as: DUONEB Take 3 mLs by nebulization every 6 (six) hours as needed (wheezing, shortness of breath).   levothyroxine  112 MCG tablet Commonly known as: SYNTHROID  TAKE 1 TABLET(112 MCG) BY MOUTH DAILY BEFORE BREAKFAST   midodrine  2.5 MG tablet Commonly known as: PROAMATINE  Take 1 tablet (2.5  mg total) by mouth 3 (three) times daily as needed (SBP <90).   tamsulosin  0.4 MG Caps capsule Commonly known as: FLOMAX  Take 2 capsules (0.8 mg total) by mouth daily after supper.        Follow-up Information     [facility] Follow up.   Specialties: Hospice, Hospice and Palliative Medicine Why: Home with hospice Contact information: [address] [place]  [address] [phone]        Kennyth Worth HERO, MD Follow up in 1 week(s).   Specialty: Family Medicine Contact information: [address] [place] [address] [phone]                Allergies  Allergen Reactions   Elavil [Amitriptyline] Shortness Of Breath and Palpitations   Lyrica  [Pregabalin] Nausea And Vomiting    Not documented with SNF   Pravachol [Pravastatin] Other (See Comments)    Myalgias    Ambien [Zolpidem] Anxiety and Palpitations   Effexor Xr [Venlafaxine] Anxiety   Ms Contin [Morphine] Palpitations    Consultations: None   Procedures/Studies: DG Swallowing Func-Speech Pathology Result Date: 04/02/2024 Table formatting from the original result was not included. Modified Barium Swallow Study Patient Details Name: Saulo Anthis MRN: [id number] Date of Birth: 23-Nov-1928 Today's Date: 04/02/2024 HPI/PMH: No data recorded Clinical Impression: Pt has a significant oropharyngeal dysphagia with DIGEST score of 3 suggestive of severe dysphagia. Oral preparation is slow and posterior transit becomes more disorganized as boluses become thicker. This leaves more oral residue, at worst with the majority of the bolus remaining in his mouth. As he works to propel this residue, some of it spills into his pharynx where it pools until he initiates a swallow. Pharyngeally he has reduced base of tongue retraction, pharyngeal squeeze, and hyolaryngeal movement. His posture also puts his pharynx more horizontally, which further contributes to reduced efficiency with a collection of residue in his valleculae. Pt also has reduced laryngeal vestibule closure and does not have adequate airway closure or sensation, resulting in silent aspiration of thin and nectar thick liquids (PAS 8). When cued to cough he inconsistently produces a cough, and when he does, it is too weak to clear even his laryngeal vestibule, so penetrates are also aspirated across the course of the study. He did not appear to add more penetration or aspiration with honey thick liquids or solids. Although not overtly aspirated during this study, there is still ongoing risk for aspiration across a meal given the amount of residue. Attempted a more reclined posture to put his pharynx in a more vertical position, but this  resulted in increased difficulty with epiglottic deflection given additional structural component from previous cervical hardware and suspected cervical osteophytes. A PO diet would not be without risk at this point. Reached out to MD, and spoke with both him and the pt's son about findings. Plan for now is to be NPO, allowing meds crushed in puree. May also want to offer a few pieces of ice with nurse after oral care to help keep some moisture and to prevent secretions from getting too thick. Palliative care meeting pending for further discussion regarding GOC. DIGEST Swallow Severity Rating*  Safety: 4  Efficiency: 1  Overall Pharyngeal Swallow Severity: 3 1: mild; 2: moderate; 3: severe; 4: profound *The Dynamic Imaging Grade of Swallowing Toxicity is standardized for the head and neck cancer population, however, demonstrates promising clinical applications across populations to standardize the clinical rating of pharyngeal swallow safety and severity. Factors that  may increase risk of adverse event in presence of aspiration Noe & Lianne 2021): Factors that may increase risk of adverse event in presence of aspiration Noe & Lianne 2021): Limited mobility; Frail or deconditioned; Weak cough; Aspiration of thick, dense, and/or acidic materials; Frequent aspiration of large volumes Recommendations/Plan: Swallowing Evaluation Recommendations Swallowing Evaluation Recommendations Recommendations: NPO except meds; Ice chips PRN after oral care Medication Administration: Crushed with puree Oral care recommendations: Oral care QID (4x/day); Oral care before ice chips/water Caregiver Recommendations: Have oral suction available Treatment Plan Treatment Plan Treatment recommendations: Therapy as outlined in treatment plan below Follow-up recommendations: Skilled nursing-short term rehab (<3 hours/day) Functional status assessment: Patient has had a recent decline in their functional status and demonstrates the  ability to make significant improvements in function in a reasonable and predictable amount of time. Treatment frequency: Min 2x/week Treatment duration: 2 weeks Interventions: Aspiration precaution training; Patient/family education; Compensatory techniques; Trials of upgraded texture/liquids Recommendations Recommendations for follow up therapy are one component of a multi-disciplinary discharge planning process, led by the attending physician.  Recommendations may be updated based on patient status, additional functional criteria and insurance authorization. Assessment: Orofacial Exam: Orofacial Exam Oral Cavity - Dentition: Missing dentition Anatomy: Anatomy: Presence of cervical hardware; Other (Comment) (curvature of spine, such that pharynx is more horizontal) Boluses Administered: Boluses Administered Boluses Administered: Thin liquids (Level 0); Mildly thick liquids (Level 2, nectar thick); Moderately thick liquids (Level 3, honey thick); Puree; Solid  Oral Impairment Domain: Oral Impairment Domain Lip Closure: -- (not well visualized, but no overt loss observed outwardly) Tongue control during bolus hold: Cohesive bolus between tongue to palatal seal Bolus preparation/mastication: Slow prolonged chewing/mashing with complete recollection Bolus transport/lingual motion: Repetitive/disorganized tongue motion Oral residue: Majority of bolus remaining Location of oral residue : Tongue Initiation of pharyngeal swallow : Pyriform sinuses  Pharyngeal Impairment Domain: Pharyngeal Impairment Domain Soft palate elevation: No bolus between soft palate (SP)/pharyngeal wall (PW) Laryngeal elevation: Partial superior movement of thyroid  cartilage/partial approximation of arytenoids to epiglottic petiole Anterior hyoid excursion: Partial anterior movement Epiglottic movement: Complete inversion Laryngeal vestibule closure: Incomplete, narrow column air/contrast in laryngeal vestibule Pharyngeal stripping wave : Present  - diminished Pharyngeal contraction (A/P view only): N/A Pharyngoesophageal segment opening: Complete distension and complete duration, no obstruction of flow Tongue base retraction: Narrow column of contrast or air between tongue base and PPW Pharyngeal residue: Collection of residue within or on pharyngeal structures Location of pharyngeal residue: Valleculae  Esophageal Impairment Domain: Esophageal Impairment Domain Esophageal clearance upright position: Esophageal retention Pill: No data recorded Penetration/Aspiration Scale Score: Penetration/Aspiration Scale Score 1.  Material does not enter airway: Moderately thick liquids (Level 3, honey thick); Puree; Solid 8.  Material enters airway, passes BELOW cords without attempt by patient to eject out (silent aspiration) : Thin liquids (Level 0); Mildly thick liquids (Level 2, nectar thick) Compensatory Strategies: Compensatory Strategies Compensatory strategies: Yes Straw: Ineffective Ineffective Straw: Mildly thick liquid (Level 2, nectar thick); Moderately thick liquid (Level 3, honey thick) Effortful swallow: Ineffective Other(comment): Ineffective (reclined position) Ineffective Other(comment): Mildly thick liquid (Level 2, nectar thick)   General Information: Caregiver present: No  Diet Prior to this Study: Dysphagia 1 (pureed); Mildly thick liquids (Level 2, nectar thick)   Temperature : Normal   Respiratory Status: WFL   Supplemental O2: None (Room air)   History of Recent Intubation: No  Behavior/Cognition: Alert; Cooperative; Pleasant mood; Confused; Requires cueing Self-Feeding Abilities: Needs assist with self-feeding Baseline vocal quality/speech: Normal Volitional Cough: -- (  inconsistently elicited, weak) Volitional Swallow: Able to elicit Exam Limitations: No limitations Goal Planning: Prognosis for improved oropharyngeal function: Fair Barriers to Reach Goals: Severity of deficits No data recorded Patient/Family Stated Goal: to eat/drink Consulted  and agree with results and recommendations: Patient; Family member/caregiver; Physician; Nurse Pain: Pain Assessment Pain Assessment: Faces Faces Pain Scale: 0 Pain Intervention(s): Monitored during session End of Session: Start Time:SLP Start Time (ACUTE ONLY): 0929 Stop Time: SLP Stop Time (ACUTE ONLY): 0944 Time Calculation:SLP Time Calculation (min) (ACUTE ONLY): 15 min Charges: SLP Evaluations $ SLP Speech Visit: 1 Visit SLP Evaluations $MBS Swallow: 1 Procedure SLP visit diagnosis: SLP Visit Diagnosis: Dysphagia, oropharyngeal phase (R13.12) Past Medical History: Past Medical History: Diagnosis Date  Arthritis   Clotting disorder   Coronary artery disease   Diabetes ([name])   Hyperlipidemia   Hypertension   Hypothyroidism 07/17/2020  Kidney disease   Loss of hearing   Macular degeneration   Myocardial infarction [facility])   Osteoarthritis   Stroke ([name])   Venous thromboembolism (VTE) 07/17/2020 Past Surgical History: Past Surgical History: Procedure Laterality Date  CARPAL TUNNEL RELEASE  2011  CORONARY ANGIOPLASTY WITH STENT PLACEMENT    CORONARY ARTERY BYPASS GRAFT  2001  x6  REPLACEMENT TOTAL KNEE    SPINE SURGERY  2008  cervical and lumbar  THYROIDECTOMY   Leita SAILOR., M.A. CCC-SLP Acute Rehabilitation Services Office: [phone] Secure chat preferred 04/02/2024, 1:11 PM  DG Chest Portable 1 View Result Date: 03/31/2024 CLINICAL DATA:  Aspiration/hypoxia. EXAM: PORTABLE CHEST 1 VIEW COMPARISON:  None Available. FINDINGS: Patient rotated to the right. Linear atelectasis again seen right lung base. Elevated right diaphragm. No pleural effusion. No pneumothorax. No displaced fractures. Old left rib fractures again seen. No consolidation or evidence of significant aspiration. Two lead pacer and stable appearing mediastinal silhouette. IMPRESSION: No pleural effusion, acute consolidation of lung, or pneumothorax. No acute to suggest the patient's hypoxia. Electronically Signed   By: Cordella Banner   On:  03/31/2024 10:09   ECHOCARDIOGRAM COMPLETE Result Date: 03/22/2024    ECHOCARDIOGRAM REPORT   Patient Name:   RODDRICK SHARRON Date of Exam: 03/22/2024 Medical Rec #:  [id number]       Height:       68.0 in Accession #:    [id number]      Weight:       174.6 lb Date of Birth:  July 12, 1928       BSA:          1.929 m Patient Age:    89 years        BP:           97/56 mmHg Patient Gender: M               HR:           70 bpm. Exam Location:  Inpatient Procedure: 2D Echo, Cardiac Doppler and Color Doppler (Both Spectral and Color            Flow Doppler were utilized during procedure). Indications:    Stroke  History:        Patient has prior history of Echocardiogram examinations, most                 recent 03/17/2021. CAD, Pacemaker; Risk Factors:Diabetes and                 Dyslipidemia.  Sonographer:    Philomena Daring Referring Phys: [phone] DENISE A WOLFE IMPRESSIONS  1. Left ventricular ejection  fraction, by estimation, is 60 to 65%. The left ventricle has normal function. The left ventricle has no regional wall motion abnormalities. There is moderate concentric left ventricular hypertrophy. Left ventricular diastolic parameters are consistent with Grade I diastolic dysfunction (impaired relaxation).  2. Right ventricular systolic function is mildly reduced. The right ventricular size is normal. Tricuspid regurgitation signal is inadequate for assessing PA pressure.  3. The mitral valve is degenerative. No evidence of mitral valve regurgitation. No evidence of mitral stenosis.  4. The aortic valve is tricuspid. There is moderate calcification of the aortic valve. Aortic valve regurgitation is not visualized. Aortic valve sclerosis/calcification is present, without any evidence of aortic stenosis.  5. There is mild dilatation of the ascending aorta, measuring 44 mm.  6. The IVC was not visualized.  7. Technically difficult study with poor acoustic windows. FINDINGS  Left Ventricle: Left ventricular ejection fraction,  by estimation, is 60 to 65%. The left ventricle has normal function. The left ventricle has no regional wall motion abnormalities. The left ventricular internal cavity size was normal in size. There is  moderate concentric left ventricular hypertrophy. Left ventricular diastolic parameters are consistent with Grade I diastolic dysfunction (impaired relaxation). Right Ventricle: The right ventricular size is normal. Right vetricular wall thickness was not well visualized. Right ventricular systolic function is mildly reduced. Tricuspid regurgitation signal is inadequate for assessing PA pressure. Left Atrium: Left atrial size was normal in size. Right Atrium: Right atrial size was normal in size. Pericardium: There is no evidence of pericardial effusion. Mitral Valve: The mitral valve is degenerative in appearance. There is moderate thickening of the mitral valve leaflet(s). No evidence of mitral valve regurgitation. No evidence of mitral valve stenosis. Tricuspid Valve: The tricuspid valve is normal in structure. Tricuspid valve regurgitation is not demonstrated. Aortic Valve: The aortic valve is tricuspid. There is moderate calcification of the aortic valve. Aortic valve regurgitation is not visualized. Aortic valve sclerosis/calcification is present, without any evidence of aortic stenosis. Pulmonic Valve: The pulmonic valve was normal in structure. Pulmonic valve regurgitation is not visualized. Aorta: The aortic root is normal in size and structure. There is mild dilatation of the ascending aorta, measuring 44 mm. Venous: The IVC was not visualized. The inferior vena cava was not well visualized. IAS/Shunts: The interatrial septum was not well visualized. Additional Comments: A device lead is visualized in the right ventricle.  LEFT VENTRICLE PLAX 2D LVIDd:         3.20 cm   Diastology LVIDs:         2.20 cm   LV e' medial:    4.90 cm/s LV PW:         1.20 cm   LV E/e' medial:  17.0 LV IVS:        1.50 cm   LV  e' lateral:   5.98 cm/s LVOT diam:     2.00 cm   LV E/e' lateral: 13.9 LV SV:         51 LV SV Index:   26 LVOT Area:     3.14 cm  RIGHT VENTRICLE RV S prime:     6.53 cm/s TAPSE (M-mode): 1.1 cm LEFT ATRIUM             Index        RIGHT ATRIUM           Index LA diam:        3.90 cm 2.02 cm/m   RA Area:  11.50 cm LA Vol (A2C):   50.2 ml 26.02 ml/m  RA Volume:   22.10 ml  11.46 ml/m LA Vol (A4C):   45.1 ml 23.38 ml/m LA Biplane Vol: 48.9 ml 25.35 ml/m  AORTIC VALVE LVOT Vmax:   75.20 cm/s LVOT Vmean:  48.600 cm/s LVOT VTI:    0.162 m  AORTA Ao Root diam: 3.10 cm Ao Asc diam:  4.40 cm MITRAL VALVE MV Area (PHT): 2.58 cm     SHUNTS MV Decel Time: 294 msec     Systemic VTI:  0.16 m MV E velocity: 83.30 cm/s   Systemic Diam: 2.00 cm MV A velocity: 100.00 cm/s MV E/A ratio:  0.83 Dalton McleanMD Electronically signed by Ezra Kanner Signature Date/Time: 03/22/2024/6:04:49 PM    Final    CT ANGIO HEAD NECK W WO CM Result Date: 03/21/2024 CLINICAL DATA:  Stroke/TIA, determine embolic source. Acute left thalamic infarct. EXAM: CT ANGIOGRAPHY HEAD AND NECK WITH AND WITHOUT CONTRAST TECHNIQUE: Multidetector CT imaging of the head and neck was performed using the standard protocol during bolus administration of intravenous contrast. Multiplanar CT image reconstructions and MIPs were obtained to evaluate the vascular anatomy. Carotid stenosis measurements (when applicable) are obtained utilizing NASCET criteria, using the distal internal carotid diameter as the denominator. RADIATION DOSE REDUCTION: This exam was performed according to the departmental dose-optimization program which includes automated exposure control, adjustment of the mA and/or kV according to patient size and/or use of iterative reconstruction technique. CONTRAST:  50mL OMNIPAQUE  IOHEXOL  350 MG/ML SOLN COMPARISON:  MRI head 03/21/2024.  CTA head and neck 08/31/2020. FINDINGS: CT HEAD FINDINGS Brain: An acute infarct is noted in the ventral  left thalamus as shown on MRI. The additional punctate acute/subacute left frontal cortical and subcortical infarcts on MRI are occult by CT. Confluent hypodensities in the cerebral white matter bilaterally are nonspecific but compatible with severe chronic small vessel ischemic disease. Chronic lacunar infarcts are again noted in the right basal ganglia, thalami, and cerebellum, and there are small chronic cortical infarcts in the right temporal lobe and right occipital lobe. There is mild cerebral atrophy. No acute intracranial hemorrhage, mass, midline shift, or extra-axial fluid collection is identified. Vascular: Calcified atherosclerosis at the skull base. Skull: No acute fracture or suspicious lesion. Sinuses/Orbits: Small mucous retention cyst in the right maxillary sinus. No significant mastoid fluid. Bilateral cataract extraction. Other: None. Review of the MIP images confirms the above findings CTA NECK FINDINGS Aortic arch: Normal variant aortic arch branching pattern with common origin of the brachiocephalic and left common carotid arteries. Mild atherosclerosis. Predominantly soft plaque in the proximal left subclavian artery resulting in luminal irregularity but no 50% or greater stenosis. Right carotid system: Patent with mixed plaque throughout the distal common and proximal internal carotid arteries. There may be a significant stenosis in the region of the ICA origin, however streak artifact from the cervical spine largely obscures the carotid bifurcation and proximal ICA. Retropharyngeal course of the proximal ICA. Mildly tortuous distal cervical ICA. Left carotid system: Patent with mixed plaque throughout the common carotid artery and proximal ICA. Potentially severe stenosis of the ICA origin with assessment limited by streak artifact. Vertebral arteries: The left vertebral artery is patent and dominant with artifact from venous contrast, cervical spine hardware, and motion limiting assessment  for stenosis. The right vertebral artery is diminutive as previously shown, and its V1 and V2 segments are extremely poorly visualized due to the above described factors, and patency of the segments cannot  be confirmed. The distal right V2 and V3 segments are patent. Skeleton: C3-4 ACDF. Exaggerated upper thoracic kyphosis. Advanced, widespread cervical facet arthrosis. Asymmetrically severe left C1-2 arthropathy. Solid bridging anterior vertebral osteophytes from C4-T1. Other neck: No gross cervical lymphadenopathy or mass. Upper chest: Clear lung apices. Partially visualized pacemaker and CABG. Review of the MIP images confirms the above findings CTA HEAD FINDINGS Anterior circulation: The internal carotid arteries are patent from skull base to carotid termini with extensive atherosclerotic calcification resulting in moderate to severe bilateral paraclinoid stenoses. ACAs and MCAs are patent proximally with mild to moderate bilateral M1 stenoses and numerous severe stenoses of ACA and MCA branch vessels bilaterally. There is proximal left A3 occlusion, and there are also are likely multiple smaller, more distal ACA branch vessel occlusions. No aneurysm is identified. Posterior circulation: The intracranial left vertebral artery is patent with up to moderate atherosclerotic narrowing. The intracranial right vertebral artery is heavily diseased proximally with severe narrowing and decreased opacification (progressed from the prior CTA) but appears faintly patent through the PICA origin and occluded beyond this. The basilar artery is patent and diffusely irregular with up to moderate stenosis. There are large right and diminutive or absent left posterior communicating arteries with hypoplasia or aplasia of the right P1 segment. The right PCA is patent with mild proximal and severe distal P2 stenoses. There is a 4 mm gap in the proximal left PCA near the P1-P2 junction which may reflect a short segment occlusion or  severe stenosis with return of a normal caliber P2 segment beyond this. There is additional atherosclerosis including severe mid and distal left P2 and proximal P3 stenoses. There are also segmental occlusions and/or severe stenoses involving and inferior right P3 branch. No aneurysm is identified. Venous sinuses: Patent. Anatomic variants: Hypoplastic right A1 segment. Review of the MIP images confirms the above findings IMPRESSION: 1. Extensive intracranial atherosclerosis including moderate to severe bilateral paraclinoid ICA stenoses, severe bilateral ACA and MCA branch vessel stenoses, and severe bilateral PCA stenoses including an apparent short segment occlusion of the left PCA near the P1-P2 junction. 2. Cervical carotid atherosclerosis with streak artifact severely limiting assessment in the region of the carotid bifurcations. Possible significant proximal ICA stenoses bilaterally. Consider further evaluation with carotid ultrasound or neck MRA. 3. Limited assessment of the vertebral arteries in the neck as detailed above. Diminutive right vertebral artery which is heavily diseased intracranially with occlusion of the distal V4 segment. 4.  Aortic Atherosclerosis (ICD10-I70.0). Electronically Signed   By: Dasie Hamburg M.D.   On: 03/21/2024 17:59   MR BRAIN WO CONTRAST Result Date: 03/21/2024 CLINICAL DATA:  Neuro deficit, acute, stroke suspected. Altered mental status. EXAM: MRI HEAD WITHOUT CONTRAST TECHNIQUE: Multiplanar, multiecho pulse sequences of the brain and surrounding structures were obtained without intravenous contrast. COMPARISON:  Head CT 03/20/2024 and MRI 09/01/2020 FINDINGS: Brain: There is a 2 cm acute infarct involving the ventral left thalamus and internal capsule. A few subcentimeter foci of milder restricted diffusion involving cortex and white matter in the left frontal lobes are suggestive of acute to subacute infarcts. Confluent T2 hyperintensities in the cerebral white matter  bilaterally are similar in extent to the prior MRI and are nonspecific but compatible with severe chronic small vessel ischemic disease. Small chronic cortical and subcortical infarcts are noted in the right temporal and right occipital lobes. There are chronic lacunar infarcts in the right basal ganglia, thalami, and both cerebellar hemispheres with many of the cerebellar infarcts being new  from the prior MRI. Chronic small-vessel changes in the pons have also slightly progressed from the prior MRI. A chronic microhemorrhage is noted in the right cerebellar hemisphere. There is mild cerebral atrophy. No mass, midline shift, or extra-axial fluid collection is identified. Vascular: Abnormal appearance of the distal right vertebral artery, partially chronic but progressed from 2022 and which may reflect occlusion or slow flow. Skull and upper cervical spine: No suspicious marrow lesion. Partially visualized postoperative and degenerative changes in the cervical spine. Sinuses/Orbits: Bilateral cataract extraction. Small mucous retention cyst in the right maxillary sinus. No significant mastoid fluid. Other: None. IMPRESSION: 1. 2 cm acute left thalamic infarct. 2. Small acute to subacute left frontal lobe infarcts. 3. Severe chronic small vessel ischemic disease with multiple chronic infarcts as above. 4. Abnormal distal right vertebral artery which may reflect occlusion or slow flow. Electronically Signed   By: Dasie Hamburg M.D.   On: 03/21/2024 12:57   CT ABDOMEN PELVIS W CONTRAST Result Date: 03/20/2024 CLINICAL DATA:  Epigastric pain, altered mental status. EXAM: CT ABDOMEN AND PELVIS WITH CONTRAST TECHNIQUE: Multidetector CT imaging of the abdomen and pelvis was performed using the standard protocol following bolus administration of intravenous contrast. RADIATION DOSE REDUCTION: This exam was performed according to the departmental dose-optimization program which includes automated exposure control,  adjustment of the mA and/or kV according to patient size and/or use of iterative reconstruction technique. CONTRAST:  80mL OMNIPAQUE  IOHEXOL  350 MG/ML SOLN COMPARISON:  None Available. FINDINGS: Lower chest: See chest CT report. Hepatobiliary: Layering gallstones within the gallbladder. No biliary ductal dilatation. No focal hepatic abnormality. Pancreas: No focal abnormality or ductal dilatation. Spleen: No focal abnormality.  Normal size. Adrenals/Urinary Tract: Air within the urinary bladder, presumably from recent catheterization. Recommend clinical correlation. No hydronephrosis. No suspicious renal or adrenal abnormality. Stomach/Bowel: Diffuse colonic diverticulosis. No active diverticulitis. No bowel obstruction or inflammatory process. Vascular/Lymphatic: Aortic atherosclerosis. No evidence of aneurysm or adenopathy. Reproductive: No visible focal abnormality. Other: No free fluid or free air. Musculoskeletal: No acute bony abnormality. IMPRESSION: Cholelithiasis.  No CT evidence of acute cholecystitis. Diffuse colonic diverticulosis.  No active diverticulitis. Aortic atherosclerosis. Air within the urinary bladder, presumably from recent catheterization. Recommend clinical correlation. Electronically Signed   By: Franky Crease M.D.   On: 03/20/2024 21:34   CT Angio Chest PE W/Cm &/Or Wo Cm Result Date: 03/20/2024 CLINICAL DATA:  Pulmonary embolism (PE) suspected, high prob. Altered mental status. EXAM: CT ANGIOGRAPHY CHEST WITH CONTRAST TECHNIQUE: Multidetector CT imaging of the chest was performed using the standard protocol during bolus administration of intravenous contrast. Multiplanar CT image reconstructions and MIPs were obtained to evaluate the vascular anatomy. RADIATION DOSE REDUCTION: This exam was performed according to the departmental dose-optimization program which includes automated exposure control, adjustment of the mA and/or kV according to patient size and/or use of iterative  reconstruction technique. CONTRAST:  80mL OMNIPAQUE  IOHEXOL  350 MG/ML SOLN COMPARISON:  None Available. FINDINGS: Cardiovascular: Heart is normal size. Prior CABG. Mild aneurysmal dilatation of the ascending thoracic aorta measuring 4.2 cm. Aortic atherosclerosis. No dissection. No filling defects in the pulmonary arteries to suggest pulmonary emboli. Mediastinum/Nodes: No mediastinal, hilar, or axillary adenopathy. Trachea and esophagus are unremarkable. Thyroid  unremarkable. Moderate-sized hiatal hernia. Lungs/Pleura: No confluent airspace opacities or effusions. Upper Abdomen: Small layering gallstones within the gallbladder. No acute findings. Musculoskeletal: Chest wall soft tissues are unremarkable. No acute bony abnormality. Review of the MIP images confirms the above findings. IMPRESSION: No evidence of pulmonary embolus. No  acute cardiopulmonary disease. 4.2 cm ascending thoracic aortic aneurysm. Recommend annual imaging followup by CTA or MRA. This recommendation follows 2010 ACCF/AHA/AATS/ACR/ASA/SCA/SCAI/SIR/STS/SVM Guidelines for the Diagnosis and Management of Patients with Thoracic Aortic Disease. Circulation. 2010; 121: [id number]. Aortic aneurysm NOS (ICD10-I71.9) Cholelithiasis. Aortic Atherosclerosis (ICD10-I70.0). Electronically Signed   By: Franky Crease M.D.   On: 03/20/2024 21:31   CT Head Wo Contrast Result Date: 03/20/2024 CLINICAL DATA:  Altered mental status EXAM: CT HEAD WITHOUT CONTRAST TECHNIQUE: Contiguous axial images were obtained from the base of the skull through the vertex without intravenous contrast. RADIATION DOSE REDUCTION: This exam was performed according to the departmental dose-optimization program which includes automated exposure control, adjustment of the mA and/or kV according to patient size and/or use of iterative reconstruction technique. COMPARISON:  MRI September 01, 2020 FINDINGS: CT HEAD: There is extensive chronic low attenuation in the cerebral white matter. Old  right occipital infarct. There is no hemorrhage. No acute ischemic changes. No mass lesion. The ventricles are normal. Skull/sinuses/orbits: No significant abnormality. IMPRESSION: Extensive chronic white matter disease. Old right occipital infarct. No acute abnormality. Electronically Signed   By: Nancyann Burns M.D.   On: 03/20/2024 16:13   CT Cervical Spine Wo Contrast Result Date: 03/20/2024 CLINICAL DATA:  Neck trauma. Altered mental status since yesterday. Urinary retention. EXAM: CT CERVICAL SPINE WITHOUT CONTRAST TECHNIQUE: Multidetector CT imaging of the cervical spine was performed without intravenous contrast. Multiplanar CT image reconstructions were also generated. RADIATION DOSE REDUCTION: This exam was performed according to the departmental dose-optimization program which includes automated exposure control, adjustment of the mA and/or kV according to patient size and/or use of iterative reconstruction technique. COMPARISON:  CT cervical spine 10/10/2021. FINDINGS: Alignment: Exaggerated thoracic kyphosis with similar mild stepwise degenerative anterolisthesis at C4-5, C5-6, C6-7 and C7-T1. Skull base and vertebrae: No evidence of acute cervical spine fracture or traumatic subluxation. Postsurgical changes from C3-4 ACDF. There is multilevel spondylosis with partially bridging anterior osteophytes from C5 through T1. Multilevel facet arthropathy. Soft tissues and spinal canal: No prevertebral fluid or swelling. No visible canal hematoma. Disc levels: Multilevel spondylosis with uncinate spurring and facet hypertrophy. There are advanced degenerative changes at the lateral C1-2 articulations, greater on the left. There is multilevel mild-to-moderate foraminal narrowing which appears chronic. Upper chest: Clear lung apices. Left subclavian pacemaker leads noted. Other: None. IMPRESSION: 1. No evidence of acute cervical spine fracture, traumatic subluxation or static signs of instability. 2. Multilevel  cervical spondylosis as described. Electronically Signed   By: Elsie Perone M.D.   On: 03/20/2024 16:05   DG Chest Portable 1 View Result Date: 03/20/2024 CLINICAL DATA:  Altered mental status. EXAM: PORTABLE CHEST 1 VIEW COMPARISON:  10/10/2021. FINDINGS: Low lung volume. Mildly elevated right hemidiaphragm. Bilateral lung fields are clear. Bilateral costophrenic angles are clear. Stable cardio-mediastinal silhouette. There is a left sided 2-lead pacemaker. There are surgical staples along the heart border and sternotomy wires, status post CABG (coronary artery bypass graft). No acute osseous abnormalities. Multiple old healed left rib fractures noted. The soft tissues are within normal limits. IMPRESSION: No active disease. Electronically Signed   By: Ree Molt M.D.   On: 03/20/2024 15:27    Subjective: Patient was seen and examined at bedside.  Overnight events noted. Patient reports feeling better,  Patient is being discharged to residential hospice at [facility].  Discharge Exam: Vitals:   04/06/24 0647 04/06/24 0951  BP: (!) 148/71 (!) 123/56  Pulse: 69 71  Resp: 17 15  Temp: 98 F (36.7 C) 98.1 F (36.7 C)  SpO2: 98% 99%   Vitals:   04/06/24 0125 04/06/24 0519 04/06/24 0647 04/06/24 0951  BP: 131/61 (!) 145/58 (!) 148/71 (!) 123/56  Pulse: 69 70 69 71  Resp: 16 18 17 15   Temp: 100.2 F (37.9 C) 97.7 F (36.5 C) 98 F (36.7 C) 98.1 F (36.7 C)  TempSrc: Axillary Axillary Axillary Oral  SpO2: 95% 98% 98% 99%  Weight:      Height:        General: Pt is alert, awake, not in acute distress Cardiovascular: RRR, S1/S2 +, no rubs, no gallops Respiratory: CTA bilaterally, no wheezing, no rhonchi Abdominal: Soft, NT, ND, bowel sounds + Extremities: no edema, no cyanosis    The results of significant diagnostics from this hospitalization (including imaging, microbiology, ancillary and laboratory) are listed below for reference.     Microbiology: Recent Results  (from the past 240 hours)  Resp panel by RT-PCR (RSV, Flu A&B, Covid) Anterior Nasal Swab     Status: None   Collection Time: 03/31/24  7:33 AM   Specimen: Anterior Nasal Swab  Result Value Ref Range Status   SARS Coronavirus 2 by RT PCR NEGATIVE NEGATIVE Final   Influenza A by PCR NEGATIVE NEGATIVE Final   Influenza B by PCR NEGATIVE NEGATIVE Final    Comment: (NOTE) The Xpert Xpress SARS-CoV-2/FLU/RSV plus assay is intended as an aid in the diagnosis of influenza from Nasopharyngeal swab specimens and should not be used as a sole basis for treatment. Nasal washings and aspirates are unacceptable for Xpert Xpress SARS-CoV-2/FLU/RSV testing.  Fact Sheet for Patients: BloggerCourse.com  Fact Sheet for Healthcare Providers: SeriousBroker.it  This test is not yet approved or cleared by the [place]  FDA and has been authorized for detection and/or diagnosis of SARS-CoV-2 by FDA under an Emergency Use Authorization (EUA). This EUA will remain in effect (meaning this test can be used) for the duration of the COVID-19 declaration under Section 564(b)(1) of the Act, 21 U.S.C. section 360bbb-3(b)(1), unless the authorization is terminated or revoked.     Resp Syncytial Virus by PCR NEGATIVE NEGATIVE Final    Comment: (NOTE) Fact Sheet for Patients: BloggerCourse.com  Fact Sheet for Healthcare Providers: SeriousBroker.it  This test is not yet approved or cleared by the [place]  FDA and has been authorized for detection and/or diagnosis of SARS-CoV-2 by FDA under an Emergency Use Authorization (EUA). This EUA will remain in effect (meaning this test can be used) for the duration of the COVID-19 declaration under Section 564(b)(1) of the Act, 21 U.S.C. section 360bbb-3(b)(1), unless the authorization is terminated or revoked.  Performed at [facility], [address], [place], [place] [address]   Blood culture (routine x 2)     Status: None   Collection Time: 03/31/24  7:56 AM   Specimen: BLOOD RIGHT ARM  Result Value Ref Range Status   Specimen Description BLOOD RIGHT ARM  Final   Special Requests   Final    BOTTLES DRAWN AEROBIC AND ANAEROBIC Blood Culture adequate volume   Culture   Final    NO GROWTH 5 DAYS Performed at [facility], [address], [place], [place] [address]    Report Status 04/05/2024 FINAL  Final  Blood culture (routine x 2)     Status: None   Collection Time: 03/31/24  8:50 AM   Specimen: BLOOD LEFT ARM  Result Value Ref Range Status  Specimen Description BLOOD LEFT ARM  Final   Special Requests   Final    BOTTLES DRAWN AEROBIC AND ANAEROBIC Blood Culture adequate volume   Culture   Final    NO GROWTH 5 DAYS Performed at [facility], [address], [place], [place] [address]    Report Status 04/05/2024 FINAL  Final     Labs: BNP (last 3 results) Recent Labs    03/31/24 0733  BNP 229.8*   Basic Metabolic Panel: Recent Labs  Lab 03/31/24 2053 03/31/24 2054 04/02/24 1106 04/03/24 0603 04/04/24 0431 04/05/24 0445  NA 150*  --  151* 149* 145 143  K 3.7  --  3.8 3.4* 4.1 4.2  CL 116*  --  118* 116* 114* 110  CO2 22  --  24 24 20* 23  GLUCOSE 561* 559* 456* 130* 259* 283*  BUN 52*  --  49* 31* 28* 24*  CREATININE 1.64*  --  1.52* 1.15 1.14 1.19  CALCIUM  8.5*  --  8.2* 7.9* 7.8* 8.0*  MG  --   --   --   --   --  2.2  PHOS  --   --   --  3.3  --  2.6   Liver Function Tests: Recent Labs  Lab 04/03/24 0603 04/04/24 0431  AST  --  35  ALT  --  45*  ALKPHOS  --  49  BILITOT  --  0.4  PROT  --  5.6*  ALBUMIN 2.2* 2.1*   No results for input(s): LIPASE, AMYLASE in the last 168 hours. No results for input(s): AMMONIA in the last 168 hours. CBC: Recent Labs  Lab 03/31/24 0733 03/31/24 0814 04/02/24 1106 04/03/24 0603 04/04/24 0431 04/05/24 0445  WBC 16.4*  --  14.3* 14.2*  14.3* 16.3*  NEUTROABS 12.1*  --   --   --   --   --   HGB 12.4* 12.6* 10.4* 8.7* 9.2* 9.0*  HCT 39.4 37.0* 33.5* 27.2* 28.7* 28.4*  MCV 104.5*  --  106.3* 105.0* 102.9* 103.3*  PLT 269  --  229 179 195 197   Cardiac Enzymes: No results for input(s): CKTOTAL, CKMB, CKMBINDEX, TROPONINI in the last 168 hours. BNP: Invalid input(s): POCBNP CBG: Recent Labs  Lab 04/05/24 0811 04/05/24 1215 04/05/24 1611 04/05/24 2211 04/06/24 0817  GLUCAP 286* 450* 351* 287* 182*   D-Dimer No results for input(s): DDIMER in the last 72 hours. Hgb A1c No results for input(s): HGBA1C in the last 72 hours. Lipid Profile No results for input(s): CHOL, HDL, LDLCALC, TRIG, CHOLHDL, LDLDIRECT in the last 72 hours. Thyroid  function studies No results for input(s): TSH, T4TOTAL, T3FREE, THYROIDAB in the last 72 hours.  Invalid input(s): FREET3 Anemia work up No results for input(s): VITAMINB12, FOLATE, FERRITIN, TIBC, IRON, RETICCTPCT in the last 72 hours. Urinalysis    Component Value Date/Time   COLORURINE YELLOW 03/31/2024 0733   APPEARANCEUR CLEAR 03/31/2024 0733   LABSPEC 1.028 03/31/2024 0733   PHURINE 5.0 03/31/2024 0733   GLUCOSEU >=500 (A) 03/31/2024 0733   GLUCOSEU NEGATIVE 08/04/2023 1232   HGBUR NEGATIVE 03/31/2024 0733   BILIRUBINUR NEGATIVE 03/31/2024 0733   KETONESUR NEGATIVE 03/31/2024 0733   PROTEINUR NEGATIVE 03/31/2024 0733   UROBILINOGEN 0.2 08/04/2023 1232   NITRITE NEGATIVE 03/31/2024 0733   LEUKOCYTESUR NEGATIVE 03/31/2024 0733   Sepsis Labs Recent Labs  Lab 04/02/24 1106 04/03/24 0603 04/04/24 0431 04/05/24 0445  WBC 14.3* 14.2* 14.3* 16.3*   Microbiology Recent  Results (from the past 240 hours)  Resp panel by RT-PCR (RSV, Flu A&B, Covid) Anterior Nasal Swab     Status: None   Collection Time: 03/31/24  7:33 AM   Specimen: Anterior Nasal Swab  Result Value Ref Range Status   SARS Coronavirus 2 by RT PCR  NEGATIVE NEGATIVE Final   Influenza A by PCR NEGATIVE NEGATIVE Final   Influenza B by PCR NEGATIVE NEGATIVE Final    Comment: (NOTE) The Xpert Xpress SARS-CoV-2/FLU/RSV plus assay is intended as an aid in the diagnosis of influenza from Nasopharyngeal swab specimens and should not be used as a sole basis for treatment. Nasal washings and aspirates are unacceptable for Xpert Xpress SARS-CoV-2/FLU/RSV testing.  Fact Sheet for Patients: BloggerCourse.com  Fact Sheet for Healthcare Providers: SeriousBroker.it  This test is not yet approved or cleared by the [place]  FDA and has been authorized for detection and/or diagnosis of SARS-CoV-2 by FDA under an Emergency Use Authorization (EUA). This EUA will remain in effect (meaning this test can be used) for the duration of the COVID-19 declaration under Section 564(b)(1) of the Act, 21 U.S.C. section 360bbb-3(b)(1), unless the authorization is terminated or revoked.     Resp Syncytial Virus by PCR NEGATIVE NEGATIVE Final    Comment: (NOTE) Fact Sheet for Patients: BloggerCourse.com  Fact Sheet for Healthcare Providers: SeriousBroker.it  This test is not yet approved or cleared by the [place]  FDA and has been authorized for detection and/or diagnosis of SARS-CoV-2 by FDA under an Emergency Use Authorization (EUA). This EUA will remain in effect (meaning this test can be used) for the duration of the COVID-19 declaration under Section 564(b)(1) of the Act, 21 U.S.C. section 360bbb-3(b)(1), unless the authorization is terminated or revoked.  Performed at [facility], [address] [place], [place] [address]   Blood culture (routine x 2)     Status: None   Collection Time: 03/31/24  7:56 AM   Specimen: BLOOD RIGHT ARM  Result Value Ref Range Status   Specimen Description BLOOD RIGHT ARM  Final   Special Requests    Final    BOTTLES DRAWN AEROBIC AND ANAEROBIC Blood Culture adequate volume   Culture   Final    NO GROWTH 5 DAYS Performed at [facility], [address], [place], [place] [address]    Report Status 04/05/2024 FINAL  Final  Blood culture (routine x 2)     Status: None   Collection Time: 03/31/24  8:50 AM   Specimen: BLOOD LEFT ARM  Result Value Ref Range Status   Specimen Description BLOOD LEFT ARM  Final   Special Requests   Final    BOTTLES DRAWN AEROBIC AND ANAEROBIC Blood Culture adequate volume   Culture   Final    NO GROWTH 5 DAYS Performed at [facility], [address] [place], [place] [address]    Report Status 04/05/2024 FINAL  Final     Time coordinating discharge: Over 30 minutes  SIGNED:   Darcel Dawley, MD  [facility] 04/06/2024, 11:48 AM Pager   If 7PM-7AM, please contact night-coverage

## 2024-04-06 NOTE — Discharge Instructions (Signed)
 Plans to follow-up with primary care physician in 1 week. Patient is being discharged to residential hospice in [place].

## 2024-04-06 NOTE — Plan of Care (Signed)
  Problem: Clinical Measurements: Goal: Ability to maintain clinical measurements within normal limits will improve Outcome: Not Progressing Goal: Will remain free from infection Outcome: Not Progressing   Problem: Activity: Goal: Risk for activity intolerance will decrease Outcome: Not Progressing   Problem: Education: Goal: Knowledge of General Education information will improve Description: Including pain rating scale, medication(s)/side effects and non-pharmacologic comfort measures Outcome: Not Applicable   Problem: Health Behavior/Discharge Planning: Goal: Ability to manage health-related needs will improve Outcome: Not Applicable

## 2024-04-06 NOTE — Progress Notes (Signed)
 Daily Progress Note   Date: 04/06/2024   [name]  DOB: 1928-08-30  MRN: [id number]  Age / Sex: 89 y.o., male  Attending Physician: Leotis Bogus, MD Primary Care Physician: Kennyth Worth HERO, MD Admit Date: 03/31/2024 Length of Stay: 6 days  Reason for Follow-up: Establishing goals of care and Psychosocial/spiritual support  Past Medical History:  Diagnosis Date   Arthritis    Clotting disorder    Coronary artery disease    Diabetes ([name])    Hyperlipidemia    Hypertension    Hypothyroidism 07/17/2020   Kidney disease    Loss of hearing    Macular degeneration    Myocardial infarction [facility])    Osteoarthritis    Stroke ([name])    Venous thromboembolism (VTE) 07/17/2020    Subjective:   Subjective: Chart Reviewed. Updates received. Patient Assessed. Created space and opportunity for patient  and family to explore thoughts and feelings regarding current medical situation.  Today's Discussion: Today before meeting with the patient/family, I reviewed vital signs, nursing flowsheets, medication administrations record, labs, and imaging. Later in the day I also reviewed the chart notes including PT note from yesterday, family medicine note from yesterday, TOC note from yesterday, SLP note from yesterday, nursing notes from today.  No labs have been ordered for today likely due to pending transition to hospice for end of life.  Today's I saw the patient at bedside, no family was present.  The patient is sleeping comfortably and elected to not wake him.  Respirations appear even and unlabored, no obvious sign of distress or discomfort.  Pending transfer to hospice care hopefully today. Awaiting evaluation by [facility] for possible IP hospice placement versus home with hospice and personal caregivers.  Later in the day [facility] came to the bedside to assess the patient for inpatient admission to their facility and [facility], [place] .  I spoke with the nurse  case manager who indicated the patient was approved and they can offer a bed today, pending transfer.  Family has been notified and they are on board with transition to inpatient hospice as previously requested and approved.  I provided emotional and general support through therapeutic listening, empathy, sharing of stories, and other techniques. I answered all questions and addressed all concerns to the best of my ability.  Review of Systems  Unable to perform ROS   Objective:   Primary Diagnoses: Present on Admission:  Aspiration pneumonia ([name])   Vital Signs:  BP (!) 145/58 (BP Location: Right Arm)   Pulse 70   Temp 97.7 F (36.5 C) (Axillary)   Resp 18   Ht 5' 8 (1.727 m)   Wt 79.2 kg   SpO2 98%   BMI 26.55 kg/m   Physical Exam Vitals and nursing note reviewed.  Constitutional:      General: He is sleeping. He is not in acute distress. HENT:     Head: Normocephalic and atraumatic.  Cardiovascular:     Rate and Rhythm: Normal rate.  Pulmonary:     Effort: Pulmonary effort is normal. No respiratory distress.     Comments: Respirations even and unlabored Abdominal:     General: Abdomen is flat.     Palliative Assessment/Data: 10%   Existing Vynca/ACP Documentation: None  Assessment & Plan:   HPI/Patient Profile:  89 y.o. male  with past medical history of type 2 diabetes, hypertension, hypothyroidism, paroxysmal A-fib on Eliquis , sick sinus syndrome status post pacemaker placement, and recent admission on  for acute CVA (left thalamic infarct and Small acute to subacute left frontal lobe infarcts)  admitted on 03/31/2024 with dyspnea at SNF.    Of note, since CVA on 9/23, pt has been bed bound with intermittent lethargy and able to tol PO infrequently. In the ED, pt febrile 100.9, hypertensive, and tachypneic W/O hypoxia. Labs notable for na 148, glucose 606, BNP 229, lactic acid 2.4-->1.9, and WBC 16.4.  He was admitted with acute hypoxic respiratory failure and  sepsis due to aspiration pneumonia, AKI and failure to thrive.  MBS on 10/6 went poorly, patient now n.p.o. PMT has been consulted to assist with goals of care conversation, anticipatory care needs, complex medical decision making.  SUMMARY OF RECOMMENDATIONS   DNR-Limited Continue current scope of care Anticipate evaluation by [facility] this morning for possible IP hospice at [facility] Pending transfer to inpatient hospice at [facility] today Ongoing support of patient and family Palliative medicine will continue to follow and support  Symptom Management:  Per primary team Palliative medicine is available to assist as needed  Code Status: DNR - Limited (DNR/DNI)  Prognosis: < 2 weeks  Discharge Planning: Hospice facility versus home with hospice  Discussed with: Patient's family, medical team, nursing team, [facility] team  Thank you for allowing us  to participate in the care of Thomas [name] PMT will continue to support holistically.  Billing based on MDM: Moderate  Detailed review of medical records (labs, imaging, vital signs), medically appropriate exam, discussed with treatment team, counseling and education to patient, family, & staff, documenting clinical information, medication management, coordination of care  Camellia Kays, NP Palliative Medicine Team  Team Phone # [phone] (Nights/Weekends)  02/24/2021, 8:17 AM

## 2024-04-06 NOTE — Plan of Care (Signed)
 Patient dressed up. Changed wound dressing. Removed PIV. DC packet handed to PTAR. Family at bedside. Patient has no complaints of pain. Insulin  not given since patient doesn't want to eat Problem: Clinical Measurements: Goal: Ability to maintain clinical measurements within normal limits will improve Outcome: Progressing Goal: Will remain free from infection Outcome: Progressing Goal: Diagnostic test results will improve Outcome: Progressing Goal: Respiratory complications will improve Outcome: Progressing Goal: Cardiovascular complication will be avoided Outcome: Progressing   Problem: Activity: Goal: Risk for activity intolerance will decrease Outcome: Progressing   Problem: Nutrition: Goal: Adequate nutrition will be maintained Outcome: Progressing   Problem: Coping: Goal: Level of anxiety will decrease Outcome: Progressing   Problem: Elimination: Goal: Will not experience complications related to bowel motility Outcome: Progressing Goal: Will not experience complications related to urinary retention Outcome: Progressing   Problem: Pain Managment: Goal: General experience of comfort will improve and/or be controlled Outcome: Progressing   Problem: Safety: Goal: Ability to remain free from injury will improve Outcome: Progressing   Problem: Skin Integrity: Goal: Risk for impaired skin integrity will decrease Outcome: Progressing   Problem: Education: Goal: Ability to describe self-care measures that may prevent or decrease complications (Diabetes Survival Skills Education) will improve Outcome: Progressing Goal: Individualized Educational Video(s) Outcome: Progressing   Problem: Coping: Goal: Ability to adjust to condition or change in health will improve Outcome: Progressing   Problem: Fluid Volume: Goal: Ability to maintain a balanced intake and output will improve Outcome: Progressing   Problem: Health Behavior/Discharge Planning: Goal: Ability to  identify and utilize available resources and services will improve Outcome: Progressing Goal: Ability to manage health-related needs will improve Outcome: Progressing   Problem: Metabolic: Goal: Ability to maintain appropriate glucose levels will improve Outcome: Progressing   Problem: Nutritional: Goal: Maintenance of adequate nutrition will improve Outcome: Progressing Goal: Progress toward achieving an optimal weight will improve Outcome: Progressing   Problem: Skin Integrity: Goal: Risk for impaired skin integrity will decrease Outcome: Progressing   Problem: Tissue Perfusion: Goal: Adequacy of tissue perfusion will improve Outcome: Progressing

## 2024-04-06 NOTE — Progress Notes (Signed)
   04/05/24 2315  Assess: MEWS Score  Temp (!) 100.5 F (38.1 C)  BP (!) 137/59  MAP (mmHg) 83  Pulse Rate 71  Resp 18  Level of Consciousness Responds to Voice  SpO2 95 %  O2 Device Room Air  Assess: MEWS Score  MEWS Temp 1  MEWS Systolic 0  MEWS Pulse 0  MEWS RR 0  MEWS LOC 1  MEWS Score 2  MEWS Score Color Yellow  Assess: if the MEWS score is Yellow or Red  Were vital signs accurate and taken at a resting state? Yes  Does the patient meet 2 or more of the SIRS criteria? Yes  Does the patient have a confirmed or suspected source of infection? Yes  MEWS guidelines implemented  Yes, yellow  Treat  MEWS Interventions Considered administering scheduled or prn medications/treatments as ordered  Take Vital Signs  Increase Vital Sign Frequency  Yellow: Q2hr x1, continue Q4hrs until patient remains green for 12hrs  Escalate  MEWS: Escalate Yellow: Discuss with charge nurse and consider notifying provider and/or RRT  Notify: Charge Nurse/RN  Name of Charge Nurse/RN Notified [place], RN  Provider Notification  Provider Name/Title DELENA Horns  Date Provider Notified 04/05/24  Time Provider Notified 2315  Method of Notification Page (secure chat)  Notification Reason Other (Comment) (yellow MEWS)  Provider response See new orders  Date of Provider Response 04/05/24  Time of Provider Response 2315  Assess: SIRS CRITERIA  SIRS Temperature  0  SIRS Respirations  0  SIRS Pulse 0  SIRS WBC 0  SIRS Score Sum  0

## 2024-04-06 NOTE — TOC Progression Note (Signed)
 Transition of Care (TOC) - Progression Note   Patient has been approved for [facility]. Family in agreement.   Discharge summary complete.   PTAR called paperwork on chart .  Patient Details  Name: [name] MRN: [id number] Date of Birth: 23-Aug-1928  Transition of Care [facility]) CM/SW Contact  Keysean Savino, [name], RN Phone Number: 04/06/2024, 12:15 PM  Clinical Narrative:       Expected Discharge Plan: Home w Hospice Care Barriers to Discharge: Continued Medical Work up               Expected Discharge Plan and Services In-house Referral: Clinical Social Work Discharge Planning Services: CM Consult Post Acute Care Choice: Hospice Living arrangements for the past 2 months: Single Family Home Expected Discharge Date: 04/06/24               DME Arranged:  (see note)                     Social Drivers of Health (SDOH) Interventions SDOH Screenings   Food Insecurity: No Food Insecurity (03/31/2024)  Housing: Low Risk  (03/31/2024)  Transportation Needs: No Transportation Needs (03/31/2024)  Utilities: Not At Risk (03/31/2024)  Alcohol Screen: Low Risk  (10/10/2023)  Depression (PHQ2-9): Low Risk  (02/06/2024)  Financial Resource Strain: Low Risk  (10/10/2023)  Physical Activity: Insufficiently Active (10/10/2023)  Social Connections: Unknown (03/31/2024)  Stress: No Stress Concern Present (10/10/2023)  Tobacco Use: Medium Risk (03/31/2024)  Health Literacy: Adequate Health Literacy (10/10/2023)    Readmission Risk Interventions     No data to display

## 2024-04-06 NOTE — Care Plan (Signed)
 Report given to Megan, RN with no questions asked.

## 2024-04-09 ENCOUNTER — Encounter

## 2024-04-12 MED ORDER — MIDODRINE HCL 2.5 MG PO TABS: 2.5000 mg | ORAL_TABLET | Freq: Three times a day (TID) | ORAL | 1 refills | Status: DC | PRN

## 2024-04-24 ENCOUNTER — Inpatient Hospital Stay: Admitting: Adult Health

## 2024-04-27 ENCOUNTER — Ambulatory Visit: Payer: Medicare Other

## 2024-04-28 DEATH — deceased

## 2024-05-15 ENCOUNTER — Ambulatory Visit: Admitting: Family Medicine

## 2024-07-27 ENCOUNTER — Ambulatory Visit: Payer: Medicare Other

## 2024-10-11 ENCOUNTER — Ambulatory Visit

## 2024-10-26 ENCOUNTER — Ambulatory Visit

## 2025-01-25 ENCOUNTER — Ambulatory Visit

## 2025-04-26 ENCOUNTER — Ambulatory Visit

## 2025-07-26 ENCOUNTER — Ambulatory Visit
